# Patient Record
Sex: Female | Born: 1945 | Race: White | Hispanic: No | Marital: Single | Smoking: Former smoker
Health system: Southern US, Community
[De-identification: ages and names within clinical notes are randomized; demographics above are authoritative.]

## PROBLEM LIST (undated history)

## (undated) DIAGNOSIS — M5430 Sciatica, unspecified side: Secondary | ICD-10-CM

## (undated) DIAGNOSIS — K219 Gastro-esophageal reflux disease without esophagitis: Secondary | ICD-10-CM

## (undated) DIAGNOSIS — M1990 Unspecified osteoarthritis, unspecified site: Secondary | ICD-10-CM

## (undated) DIAGNOSIS — I10 Essential (primary) hypertension: Secondary | ICD-10-CM

## (undated) DIAGNOSIS — D649 Anemia, unspecified: Secondary | ICD-10-CM

## (undated) DIAGNOSIS — T8859XA Other complications of anesthesia, initial encounter: Secondary | ICD-10-CM

## (undated) DIAGNOSIS — T884XXA Failed or difficult intubation, initial encounter: Secondary | ICD-10-CM

## (undated) DIAGNOSIS — I82409 Acute embolism and thrombosis of unspecified deep veins of unspecified lower extremity: Secondary | ICD-10-CM

## (undated) DIAGNOSIS — T4145XA Adverse effect of unspecified anesthetic, initial encounter: Secondary | ICD-10-CM

## (undated) HISTORY — PX: ABDOMINAL HYSTERECTOMY: SHX81

---
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
## 2004-08-29 ENCOUNTER — Ambulatory Visit: Payer: Self-pay | Admitting: Internal Medicine

## 2005-05-05 HISTORY — PX: CATARACT EXTRACTION W/ INTRAOCULAR LENS IMPLANT: SHX1309

## 2005-06-18 ENCOUNTER — Ambulatory Visit: Payer: Self-pay | Admitting: Ophthalmology

## 2005-09-12 ENCOUNTER — Ambulatory Visit: Payer: Self-pay | Admitting: Internal Medicine

## 2005-11-26 ENCOUNTER — Ambulatory Visit: Payer: Self-pay | Admitting: Ophthalmology

## 2006-09-16 ENCOUNTER — Ambulatory Visit: Payer: Self-pay | Admitting: Internal Medicine

## 2007-08-06 ENCOUNTER — Ambulatory Visit: Payer: Self-pay | Admitting: Internal Medicine

## 2007-09-20 ENCOUNTER — Ambulatory Visit: Payer: Self-pay | Admitting: Internal Medicine

## 2007-10-05 ENCOUNTER — Ambulatory Visit: Payer: Self-pay | Admitting: Unknown Physician Specialty

## 2008-06-12 ENCOUNTER — Ambulatory Visit: Payer: Self-pay | Admitting: Unknown Physician Specialty

## 2008-08-02 ENCOUNTER — Ambulatory Visit: Payer: Self-pay | Admitting: Unknown Physician Specialty

## 2008-08-07 ENCOUNTER — Ambulatory Visit: Payer: Self-pay | Admitting: Unknown Physician Specialty

## 2008-09-04 ENCOUNTER — Encounter: Payer: Self-pay | Admitting: Unknown Physician Specialty

## 2008-10-03 ENCOUNTER — Encounter: Payer: Self-pay | Admitting: Unknown Physician Specialty

## 2008-10-30 ENCOUNTER — Ambulatory Visit: Payer: Self-pay | Admitting: Internal Medicine

## 2008-11-02 ENCOUNTER — Encounter: Payer: Self-pay | Admitting: Unknown Physician Specialty

## 2008-12-03 ENCOUNTER — Encounter: Payer: Self-pay | Admitting: Unknown Physician Specialty

## 2009-11-20 ENCOUNTER — Ambulatory Visit: Payer: Self-pay | Admitting: Internal Medicine

## 2010-05-05 HISTORY — PX: SHOULDER ARTHROSCOPY WITH OPEN ROTATOR CUFF REPAIR: SHX6092

## 2011-01-23 ENCOUNTER — Ambulatory Visit: Payer: Self-pay | Admitting: Internal Medicine

## 2012-01-27 ENCOUNTER — Ambulatory Visit: Payer: Self-pay

## 2012-03-23 ENCOUNTER — Encounter: Payer: Self-pay | Admitting: Rheumatology

## 2012-04-04 ENCOUNTER — Encounter: Payer: Self-pay | Admitting: Rheumatology

## 2013-02-28 ENCOUNTER — Ambulatory Visit: Payer: Self-pay

## 2013-12-30 ENCOUNTER — Ambulatory Visit: Payer: Self-pay

## 2014-04-14 ENCOUNTER — Ambulatory Visit: Payer: Self-pay

## 2014-06-16 ENCOUNTER — Ambulatory Visit: Payer: Self-pay | Admitting: Unknown Physician Specialty

## 2014-06-28 DIAGNOSIS — K5792 Diverticulitis of intestine, part unspecified, without perforation or abscess without bleeding: Secondary | ICD-10-CM | POA: Insufficient documentation

## 2014-08-28 LAB — SURGICAL PATHOLOGY

## 2015-06-08 ENCOUNTER — Other Ambulatory Visit: Payer: Self-pay | Admitting: Infectious Diseases

## 2015-06-08 DIAGNOSIS — Z1231 Encounter for screening mammogram for malignant neoplasm of breast: Secondary | ICD-10-CM

## 2015-06-13 ENCOUNTER — Ambulatory Visit
Admission: RE | Admit: 2015-06-13 | Discharge: 2015-06-13 | Disposition: A | Discharge status: Home / Self Care | Payer: Medicare Other | Source: Ambulatory Visit | Attending: Infectious Diseases | Admitting: Infectious Diseases

## 2015-06-13 ENCOUNTER — Other Ambulatory Visit: Payer: Self-pay | Admitting: Infectious Diseases

## 2015-06-13 DIAGNOSIS — Z1231 Encounter for screening mammogram for malignant neoplasm of breast: Secondary | ICD-10-CM

## 2015-10-12 ENCOUNTER — Other Ambulatory Visit: Payer: Self-pay | Admitting: Infectious Diseases

## 2015-10-12 DIAGNOSIS — R110 Nausea: Secondary | ICD-10-CM

## 2015-10-12 DIAGNOSIS — R1033 Periumbilical pain: Secondary | ICD-10-CM

## 2015-10-12 DIAGNOSIS — G8929 Other chronic pain: Secondary | ICD-10-CM

## 2015-10-12 DIAGNOSIS — M545 Low back pain: Secondary | ICD-10-CM

## 2015-10-18 ENCOUNTER — Ambulatory Visit
Admission: RE | Admit: 2015-10-18 | Discharge: 2015-10-18 | Disposition: A | Discharge status: Home / Self Care | Payer: Medicare Other | Source: Ambulatory Visit | Attending: Infectious Diseases | Admitting: Infectious Diseases

## 2015-10-18 DIAGNOSIS — K760 Fatty (change of) liver, not elsewhere classified: Secondary | ICD-10-CM | POA: Diagnosis not present

## 2015-10-18 DIAGNOSIS — R938 Abnormal findings on diagnostic imaging of other specified body structures: Secondary | ICD-10-CM | POA: Insufficient documentation

## 2015-10-18 DIAGNOSIS — R1033 Periumbilical pain: Secondary | ICD-10-CM

## 2015-10-18 DIAGNOSIS — M545 Low back pain: Secondary | ICD-10-CM | POA: Insufficient documentation

## 2015-10-18 DIAGNOSIS — R110 Nausea: Secondary | ICD-10-CM

## 2015-10-18 DIAGNOSIS — G8929 Other chronic pain: Secondary | ICD-10-CM | POA: Diagnosis not present

## 2015-10-18 DIAGNOSIS — K8020 Calculus of gallbladder without cholecystitis without obstruction: Secondary | ICD-10-CM | POA: Insufficient documentation

## 2015-10-19 DIAGNOSIS — K8020 Calculus of gallbladder without cholecystitis without obstruction: Secondary | ICD-10-CM | POA: Insufficient documentation

## 2015-10-19 DIAGNOSIS — K8050 Calculus of bile duct without cholangitis or cholecystitis without obstruction: Secondary | ICD-10-CM | POA: Insufficient documentation

## 2015-10-22 ENCOUNTER — Other Ambulatory Visit: Payer: Self-pay | Admitting: Infectious Diseases

## 2015-10-22 DIAGNOSIS — N281 Cyst of kidney, acquired: Secondary | ICD-10-CM

## 2015-11-09 ENCOUNTER — Ambulatory Visit
Admission: RE | Admit: 2015-11-09 | Discharge: 2015-11-09 | Disposition: A | Discharge status: Home / Self Care | Payer: Medicare Other | Source: Ambulatory Visit | Attending: Infectious Diseases | Admitting: Infectious Diseases

## 2015-11-09 DIAGNOSIS — Q6100 Congenital renal cyst, unspecified: Secondary | ICD-10-CM | POA: Insufficient documentation

## 2015-11-09 DIAGNOSIS — N200 Calculus of kidney: Secondary | ICD-10-CM | POA: Diagnosis not present

## 2015-11-09 DIAGNOSIS — K8020 Calculus of gallbladder without cholecystitis without obstruction: Secondary | ICD-10-CM | POA: Insufficient documentation

## 2015-11-09 DIAGNOSIS — I700 Atherosclerosis of aorta: Secondary | ICD-10-CM | POA: Diagnosis not present

## 2015-11-09 DIAGNOSIS — K760 Fatty (change of) liver, not elsewhere classified: Secondary | ICD-10-CM | POA: Diagnosis not present

## 2015-11-09 DIAGNOSIS — N281 Cyst of kidney, acquired: Secondary | ICD-10-CM

## 2015-11-09 DIAGNOSIS — K862 Cyst of pancreas: Secondary | ICD-10-CM | POA: Diagnosis not present

## 2015-11-09 HISTORY — DX: Essential (primary) hypertension: I10

## 2015-11-09 MED ORDER — IOPAMIDOL (ISOVUE-370) INJECTION 76%
100.0000 mL | Freq: Once | INTRAVENOUS | Status: AC | PRN
  Administered 2015-11-09: 100 mL via INTRAVENOUS

## 2015-12-24 DIAGNOSIS — M5431 Sciatica, right side: Secondary | ICD-10-CM | POA: Insufficient documentation

## 2015-12-24 DIAGNOSIS — I10 Essential (primary) hypertension: Secondary | ICD-10-CM | POA: Insufficient documentation

## 2016-01-03 ENCOUNTER — Encounter
Admission: RE | Admit: 2016-01-03 | Discharge: 2016-01-03 | Disposition: A | Discharge status: Home / Self Care | Payer: Medicare Other | Source: Ambulatory Visit | Attending: Surgery | Admitting: Surgery

## 2016-01-03 DIAGNOSIS — I10 Essential (primary) hypertension: Secondary | ICD-10-CM

## 2016-01-03 DIAGNOSIS — Z01812 Encounter for preprocedural laboratory examination: Secondary | ICD-10-CM | POA: Insufficient documentation

## 2016-01-03 DIAGNOSIS — K8010 Calculus of gallbladder with chronic cholecystitis without obstruction: Secondary | ICD-10-CM | POA: Insufficient documentation

## 2016-01-03 DIAGNOSIS — Z01810 Encounter for preprocedural cardiovascular examination: Secondary | ICD-10-CM | POA: Insufficient documentation

## 2016-01-03 HISTORY — DX: Failed or difficult intubation, initial encounter: T88.4XXA

## 2016-01-03 NOTE — Patient Instructions (Addendum)
  Your procedure is scheduled on: Thursday Sept 7, 2017. Report to Same Day Surgery. To find out your arrival time please call [phone] between 1PM - 3PM on Wednesday Sept. 6,2017.  Remember: Instructions that are not followed completely may result in serious medical risk, up to and including death, or upon the discretion of your surgeon and anesthesiologist your surgery may need to be rescheduled.    _x___ 1. Do not eat food or drink liquids after midnight. No gum chewing or hard candies.     _x___ 2. No Alcohol for 24 hours before or after surgery.   ____ 3. Bring all medications with you on the day of surgery if instructed.    __x__ 4. Notify your doctor if there is any change in your medical condition     (cold, fever, infections).    _____ 5. No smoking 24 hours prior to surgery.     Do not wear jewelry, make-up, hairpins, clips or nail polish.  Do not wear lotions, powders, or perfumes.   Do not shave 48 hours prior to surgery. Men may shave face and neck.  Do not bring valuables to the hospital.    [facility] is not responsible for any belongings or valuables.               Contacts, dentures or bridgework may not be worn into surgery.  Leave your suitcase in the car. After surgery it may be brought to your room.  For patients admitted to the hospital, discharge time is determined by your treatment team.   Patients discharged the day of surgery will not be allowed to drive home.    Please read over the following fact sheets that you were given:   [facility] Preparing for Surgery  _x___ Take these medicines the morning of surgery with A SIP OF WATER:    1. metoprolol (LOPRESSOR)  2. omeprazole (PRILOSEC) take an extra dose the night prior to surgery.    ____ Fleet Enema (as directed)   __x__ Use CHG Soap as directed on instruction sheet  ____ Use inhalers on the day of surgery and bring to hospital day of surgery  ____ Stop metformin 2 days prior to  surgery    ____ Take 1/2 of usual insulin dose the night before surgery and none on the morning of surgery.   _x___ Stop aspirin on 01/03/16.  _x___ Stop Anti-inflammatories such as Advil, Aleve, Ibuprofen, Motrin, Naproxen, Naprosyn, Goodies powders or aspirin products. OK to take Tylenol.   _x___ Stop supplements: CITICOLINE SODIUM,Glucosamine-Chondroitin (MOVE FREE), Omega-3 Fatty Acids (FISH OIL), Turmeric Curcumin,  Cyanocobalamin (VITAMIN B 12) and vitamin C until after surgery.    ____ Bring C-Pap to the hospital.

## 2016-01-10 ENCOUNTER — Ambulatory Visit: Payer: Medicare Other | Admitting: Anesthesiology

## 2016-01-10 ENCOUNTER — Encounter
Admission: RE | Disposition: A | Discharge status: Home / Self Care | Payer: Self-pay | Source: Ambulatory Visit | Attending: Surgery

## 2016-01-10 ENCOUNTER — Ambulatory Visit
Admission: RE | Admit: 2016-01-10 | Discharge: 2016-01-10 | Disposition: A | Discharge status: Home / Self Care | Payer: Medicare Other | Source: Ambulatory Visit | Attending: Surgery | Admitting: Surgery

## 2016-01-10 DIAGNOSIS — Z9841 Cataract extraction status, right eye: Secondary | ICD-10-CM | POA: Insufficient documentation

## 2016-01-10 DIAGNOSIS — I10 Essential (primary) hypertension: Secondary | ICD-10-CM | POA: Diagnosis not present

## 2016-01-10 DIAGNOSIS — Z801 Family history of malignant neoplasm of trachea, bronchus and lung: Secondary | ICD-10-CM | POA: Diagnosis not present

## 2016-01-10 DIAGNOSIS — Z7982 Long term (current) use of aspirin: Secondary | ICD-10-CM | POA: Insufficient documentation

## 2016-01-10 DIAGNOSIS — E538 Deficiency of other specified B group vitamins: Secondary | ICD-10-CM | POA: Insufficient documentation

## 2016-01-10 DIAGNOSIS — I471 Supraventricular tachycardia: Secondary | ICD-10-CM | POA: Insufficient documentation

## 2016-01-10 DIAGNOSIS — Z9842 Cataract extraction status, left eye: Secondary | ICD-10-CM | POA: Diagnosis not present

## 2016-01-10 DIAGNOSIS — K8020 Calculus of gallbladder without cholecystitis without obstruction: Secondary | ICD-10-CM

## 2016-01-10 DIAGNOSIS — K8010 Calculus of gallbladder with chronic cholecystitis without obstruction: Secondary | ICD-10-CM | POA: Diagnosis not present

## 2016-01-10 DIAGNOSIS — Z79899 Other long term (current) drug therapy: Secondary | ICD-10-CM | POA: Diagnosis not present

## 2016-01-10 DIAGNOSIS — M1990 Unspecified osteoarthritis, unspecified site: Secondary | ICD-10-CM | POA: Insufficient documentation

## 2016-01-10 DIAGNOSIS — I890 Lymphedema, not elsewhere classified: Secondary | ICD-10-CM | POA: Insufficient documentation

## 2016-01-10 DIAGNOSIS — Z5309 Procedure and treatment not carried out because of other contraindication: Secondary | ICD-10-CM | POA: Insufficient documentation

## 2016-01-10 DIAGNOSIS — E785 Hyperlipidemia, unspecified: Secondary | ICD-10-CM | POA: Diagnosis not present

## 2016-01-10 DIAGNOSIS — Z87891 Personal history of nicotine dependence: Secondary | ICD-10-CM | POA: Insufficient documentation

## 2016-01-10 DIAGNOSIS — Z888 Allergy status to other drugs, medicaments and biological substances status: Secondary | ICD-10-CM | POA: Insufficient documentation

## 2016-01-10 DIAGNOSIS — Z8249 Family history of ischemic heart disease and other diseases of the circulatory system: Secondary | ICD-10-CM | POA: Insufficient documentation

## 2016-01-10 DIAGNOSIS — Z885 Allergy status to narcotic agent status: Secondary | ICD-10-CM | POA: Diagnosis not present

## 2016-01-10 DIAGNOSIS — Z90710 Acquired absence of both cervix and uterus: Secondary | ICD-10-CM | POA: Diagnosis not present

## 2016-01-10 LAB — BASIC METABOLIC PANEL
Anion gap: 8 (ref 5–15)
BUN: 17 mg/dL (ref 6–20)
CHLORIDE: 104 mmol/L (ref 101–111)
CO2: 27 mmol/L (ref 22–32)
CREATININE: 0.69 mg/dL (ref 0.44–1.00)
Calcium: 9.2 mg/dL (ref 8.9–10.3)
GFR calc Af Amer: >60 mL/min (ref >60)
GFR calc non Af Amer: >60 mL/min (ref >60)
GLUCOSE: 105 mg/dL — AB (ref 65–99)
Potassium: 4.2 mmol/L (ref 3.5–5.1)
SODIUM: 139 mmol/L (ref 135–145)

## 2016-01-10 LAB — TROPONIN I: Troponin I: <0.03 ng/mL (ref <0.03)

## 2016-01-10 SURGERY — CANCELLED PROCEDURE
Anesthesia: General | Wound class: Clean Contaminated

## 2016-01-10 MED ORDER — LACTATED RINGERS IV SOLN
INTRAVENOUS | Status: DC
  Administered 2016-01-10 (×2): via INTRAVENOUS

## 2016-01-10 MED ORDER — PHENYLEPHRINE HCL 10 MG/ML IJ SOLN
INTRAMUSCULAR | Status: DC | PRN
  Administered 2016-01-10: 100 ug via INTRAVENOUS

## 2016-01-10 MED ORDER — METOPROLOL TARTRATE 5 MG/5ML IV SOLN
5.0000 mg | Freq: Once | INTRAVENOUS | Status: AC
  Administered 2016-01-10: 5 mg via INTRAVENOUS

## 2016-01-10 MED ORDER — METOPROLOL TARTRATE 50 MG PO TABS: 50.0000 mg | ORAL_TABLET | Freq: Two times a day (BID) | ORAL | Status: DC

## 2016-01-10 MED ORDER — LIDOCAINE HCL (CARDIAC) 20 MG/ML IV SOLN
INTRAVENOUS | Status: DC | PRN
  Administered 2016-01-10: 100 mg via INTRAVENOUS

## 2016-01-10 MED ORDER — MIDAZOLAM HCL 2 MG/2ML IJ SOLN
INTRAMUSCULAR | Status: DC | PRN
  Administered 2016-01-10: 1 mg via INTRAVENOUS

## 2016-01-10 MED ORDER — ONDANSETRON HCL 4 MG/2ML IJ SOLN: 4.0000 mg | Freq: Once | INTRAMUSCULAR | Status: DC | PRN

## 2016-01-10 MED ORDER — ROCURONIUM BROMIDE 100 MG/10ML IV SOLN
INTRAVENOUS | Status: DC | PRN
  Administered 2016-01-10: 40 mg via INTRAVENOUS

## 2016-01-10 MED ORDER — SUGAMMADEX SODIUM 200 MG/2ML IV SOLN
INTRAVENOUS | Status: DC | PRN
  Administered 2016-01-10: 200 mg via INTRAVENOUS

## 2016-01-10 MED ORDER — FENTANYL CITRATE (PF) 100 MCG/2ML IJ SOLN: 25.0000 ug | INTRAMUSCULAR | Status: DC | PRN

## 2016-01-10 MED ORDER — METOPROLOL TARTRATE 5 MG/5ML IV SOLN
INTRAVENOUS | Status: AC
  Administered 2016-01-10: 5 mg via INTRAVENOUS
  Filled 2016-01-10: qty 5

## 2016-01-10 MED ORDER — ESMOLOL HCL 100 MG/10ML IV SOLN
INTRAVENOUS | Status: DC | PRN
  Administered 2016-01-10: 20 mg via INTRAVENOUS
  Administered 2016-01-10: 30 mg via INTRAVENOUS

## 2016-01-10 MED ORDER — ONDANSETRON HCL 4 MG/2ML IJ SOLN
INTRAMUSCULAR | Status: DC | PRN
  Administered 2016-01-10: 4 mg via INTRAVENOUS

## 2016-01-10 MED ORDER — PROPOFOL 10 MG/ML IV BOLUS
INTRAVENOUS | Status: DC | PRN
  Administered 2016-01-10: 140 mg via INTRAVENOUS

## 2016-01-10 MED ORDER — FENTANYL CITRATE (PF) 100 MCG/2ML IJ SOLN
INTRAMUSCULAR | Status: DC | PRN
  Administered 2016-01-10: 100 ug via INTRAVENOUS

## 2016-01-10 SURGICAL SUPPLY — 39 items
APPLIER CLIP ROT 10 11.4 M/L (STAPLE)
APR CLP MED LRG 11.4X10 (STAPLE)
CANISTER SUCT 1200ML W/VALVE (MISCELLANEOUS) ×1 IMPLANT
CANNULA DILATOR 10 W/SLV (CANNULA) ×1 IMPLANT
CANNULA DILATOR 10MM W/SLV (CANNULA)
CATH REDDICK CHOLANGI 4FR 50CM (CATHETERS) ×1 IMPLANT
CHLORAPREP W/TINT 26ML (MISCELLANEOUS) ×1 IMPLANT
CLIP APPLIE ROT 10 11.4 M/L (STAPLE) ×1 IMPLANT
CLOSURE WOUND 1/2 X4 (GAUZE/BANDAGES/DRESSINGS)
DRAPE SHEET LG 3/4 BI-LAMINATE (DRAPES) ×1 IMPLANT
ELECT REM PT RETURN 9FT ADLT (ELECTROSURGICAL)
ELECTRODE REM PT RTRN 9FT ADLT (ELECTROSURGICAL) ×1 IMPLANT
GAUZE SPONGE 4X4 12PLY STRL (GAUZE/BANDAGES/DRESSINGS) ×1 IMPLANT
GLOVE BIO SURGEON STRL SZ7.5 (GLOVE) ×1 IMPLANT
GOWN STRL REUS W/ TWL LRG LVL3 (GOWN DISPOSABLE) ×4 IMPLANT
GOWN STRL REUS W/TWL LRG LVL3 (GOWN DISPOSABLE)
IRRIGATION STRYKERFLOW (MISCELLANEOUS) ×1 IMPLANT
IRRIGATOR STRYKERFLOW (MISCELLANEOUS)
IV NS 1000ML (IV SOLUTION)
IV NS 1000ML BAXH (IV SOLUTION) ×1 IMPLANT
KIT RM TURNOVER STRD PROC AR (KITS) ×1 IMPLANT
LABEL OR SOLS (LABEL) ×1 IMPLANT
NDL FILTER BLUNT 18X1 1/2 (NEEDLE) ×1 IMPLANT
NDL INSUFF ACCESS 14 VERSASTEP (NEEDLE) ×1 IMPLANT
NEEDLE FILTER BLUNT 18X 1/2SAF (NEEDLE)
NEEDLE FILTER BLUNT 18X1 1/2 (NEEDLE) IMPLANT
NS IRRIG 500ML POUR BTL (IV SOLUTION) ×1 IMPLANT
PACK LAP CHOLECYSTECTOMY (MISCELLANEOUS) ×1 IMPLANT
SCISSORS METZENBAUM CVD 33 (INSTRUMENTS) ×1 IMPLANT
SEAL FOR SCOPE WARMER C3101 (MISCELLANEOUS) ×1 IMPLANT
SLEEVE ENDOPATH XCEL 5M (ENDOMECHANICALS) ×1 IMPLANT
STRIP CLOSURE SKIN 1/2X4 (GAUZE/BANDAGES/DRESSINGS) ×1 IMPLANT
SUT CHROMIC 5 0 RB 1 27 (SUTURE) ×1 IMPLANT
SUT VIC AB 0 CT2 27 (SUTURE) IMPLANT
SYR 3ML LL SCALE MARK (SYRINGE) ×1 IMPLANT
TROCAR XCEL NON-BLD 11X100MML (ENDOMECHANICALS) ×1 IMPLANT
TROCAR XCEL NON-BLD 5MMX100MML (ENDOMECHANICALS) ×1 IMPLANT
TUBING INSUFFLATOR HI FLOW (MISCELLANEOUS) ×1 IMPLANT
WATER STERILE IRR 1000ML POUR (IV SOLUTION) ×1 IMPLANT

## 2016-01-10 NOTE — H&P (Signed)
Hannah Yu is an 70 y.o. female.   Chief Complaint: History of epigastric pains HPI: She has a two-year known history of gallbladder disease. She has a history of intermittent pains in the right upper quadrant. The last pain was in June. She had minimal nausea. She had ultrasound findings of a 2.5 cm gallstone. Surgery was recommended for definitive treatment.  She was brought in through the outpatient surgery department and prepared for the operating room. In the operating room she was placed under general anesthesia and the abdomen was prepared for surgery. After induction of anesthesia did develop a supraventricular tachycardia with ST depression. She was given esmolol wits momentarily slowed her heart rate. But had recurrence of tachycardia. On consultation with anesthesia decision was made to counsel the operation  She was carried to the recovery room where she had some palpable additional tachycardia. Dr. Gwen Pounds came for cardiology consultation. She appears to have had supraventricular tachycardia. After awakening she reported no dyspnea and no chest pain.  Past Medical History:  Diagnosis Date  . Difficult intubation   . Hypertension     Past Surgical History:  Procedure Laterality Date  . ABDOMINAL HYSTERECTOMY    . CATARACT EXTRACTION W/ INTRAOCULAR LENS IMPLANT Bilateral 2007   eyes done within 1 month of each other.  Marland Kitchen SHOULDER ARTHROSCOPY WITH OPEN ROTATOR CUFF REPAIR Right 2012    Family History  Problem Relation Age of Onset  . Breast cancer Sister 45   Social History:  reports that she quit smoking about 23 years ago. She has never used smokeless tobacco. She reports that she drinks about 2.4 oz of alcohol per week . She reports that she does not use drugs.  Allergies:  Allergies  Allergen Reactions  . Codeine Diarrhea  . Cortisone Swelling  . Lovastatin Other (See Comments)    Muscle cramps    Medications Prior to Admission  Medication Sig Dispense  Refill  . cyclobenzaprine (FLEXERIL) 5 MG tablet Take 5 mg by mouth 3 (three) times daily as needed for muscle spasms.    . furosemide (LASIX) 40 MG tablet Take 40 mg by mouth 2 (two) times daily as needed for edema.    . metoprolol (LOPRESSOR) 50 MG tablet Take 50 mg by mouth every morning.    . Omega-3 Fatty Acids (FISH OIL) 1200 MG CAPS Take 1 capsule by mouth every morning.    Marland Kitchen omeprazole (PRILOSEC) 20 MG capsule Take 20 mg by mouth every morning.    . Ascorbic Acid (VITAMIN C) 1000 MG tablet Take 1,000 mg by mouth every morning.    Marland Kitchen aspirin 81 MG chewable tablet Chew 81 mg by mouth every morning.    Marland Kitchen CITICOLINE SODIUM PO Take 1 tablet by mouth every morning.    . Cyanocobalamin (VITAMIN B 12 PO) Take 5,000 mcg by mouth every morning.    . Glucosamine-Chondroitin (MOVE FREE PO) Take 1 tablet by mouth every morning.    . naproxen sodium (ANAPROX) 220 MG tablet Take 220 mg by mouth 2 (two) times daily as needed.    . Turmeric Curcumin 500 MG CAPS Take 1 capsule by mouth every morning.      Results for orders placed or performed during the hospital encounter of 01/10/16 (from the past 48 hour(s))  Troponin I     Status: None   Collection Time: 01/10/16  9:19 AM  Result Value Ref Range   Troponin I <0.03 <0.03 ng/mL   No results found.  ROS  she reports no recent acute illness such as cough cold or sore throat. She reports no chest pains and no dyspnea.  Blood pressure (!) 165/74, pulse 73, temperature 97.7 F (36.5 C), resp. rate 15, weight 82.6 kg (182 lb), SpO2 100 %. Physical Exam after a period of observation in the recovery room she was fully awake and alert and oriented. Sclera clear. Sclerae are lung sounds were clear heart regular rhythm S1 and S2. Abdomen soft flat and nontender.  CLINICAL DATA: Troponin less than 0.03. EKG with episodes of sinus tachycardia  Assessment/Plan Chronic cholecystitis cholelithiasis  Supraventricular tachycardia  Anticipation is to  discharge today and have outpatient follow-up in cardiology to consider further evaluation and possible stress testing. Anticipate rescheduling surgery at a later date after cardiac evaluation.  This was discussed with Dr. Henrene HawkingKephart and also with Dr. Gwen PoundsKowalski and also with the patient and friend.  Duration of today's surgical evaluation and critical care monitoring was greater than 2 hours.  Renda RollsWilton Adryan Druckenmiller, MD 01/10/2016, 9:55 AM

## 2016-01-10 NOTE — Anesthesia Procedure Notes (Signed)
Procedure Name: Intubation Date/Time: 01/10/2016 7:45 AM Performed by: Almeta MonasFLETCHER, Makynzi Eastland Pre-anesthesia Checklist: Patient identified, Emergency Drugs available, Suction available and Patient being monitored Patient Re-evaluated:Patient Re-evaluated prior to inductionOxygen Delivery Method: Circle system utilized Preoxygenation: Pre-oxygenation with 100% oxygen Intubation Type: IV induction Ventilation: Mask ventilation without difficulty Laryngoscope Size: Miller and 2 (2) Grade View: Grade I Tube type: Oral Tube size: 6.5 mm Number of attempts: 1 Airway Equipment and Method: Stylet Placement Confirmation: ETT inserted through vocal cords under direct vision,  positive ETCO2,  CO2 detector and breath sounds checked- equal and bilateral Secured at: 21 cm Tube secured with: Tape

## 2016-01-10 NOTE — Progress Notes (Signed)
IV removed at 13:15

## 2016-01-10 NOTE — Anesthesia Postprocedure Evaluation (Signed)
Anesthesia Post Note  Patient: Darrick PennaMargaret T Lippard  Procedure(s) Performed: Procedure(s): CANCELLED PROCEDURE  Patient location during evaluation: PACU Anesthesia Type: General Level of consciousness: awake and alert Pain management: pain level controlled Respiratory status: spontaneous breathing and respiratory function stable Cardiovascular status: blood pressure returned to baseline and tachycardic Anesthetic complications: no Comments: Pt with significant tachycardia immediately following induction. Pt dosed with esmolol and phenylephrine. Better HR control, but still with some bursts of tachycardia. Slight ST changes with the tachycardia. Pt was taken to recovery and eval done by cardiologist who recommended better pre-op control with metoprolol. Pt otherwise stable and D/C'd home.    Last Vitals:  Vitals:   01/10/16 0927 01/10/16 0942  BP: (!) 181/89 (!) 189/70  Pulse: 77 72  Resp: 11 16  Temp:      Last Pain:  Vitals:   01/10/16 0857  TempSrc:   PainSc: Asleep                 KEPHART,WILLIAM K

## 2016-01-10 NOTE — Discharge Instructions (Addendum)
AMBULATORY SURGERY  DISCHARGE INSTRUCTIONS   1) The drugs that you were given will stay in your system until tomorrow so for the next 24 hours you should not:  A) Drive an automobile B) Make any legal decisions C) Drink any alcoholic beverage   2) You may resume regular meals tomorrow.  Today it is better to start with liquids and gradually work up to solid foods.  You may eat anything you prefer, but it is better to start with liquids, then soup and crackers, and gradually work up to solid foods.   3) Please notify your doctor immediately if you have any unusual bleeding, trouble breathing, redness and pain at the surgery site, drainage, fever, or pain not relieved by medication.    4) Additional Instructions:        Please contact your physician with any problems or Same Day Surgery at [phone], Monday through Friday 6 am to 4 pm, or [place] at [facility] Main number at [phone].  Take metoprolol 50 mg 2 times per day.  Follow up in the cardiology office with Dr. Gwen PoundsKowalski for further evaluation.  Contact Dr. Michaelle CopasSmith's office once cardiology evaluation is completed.

## 2016-01-10 NOTE — Op Note (Signed)
OPERATIVE REPORT  PREOPERATIVE  DIAGNOSIS: . Chronic cholecystitis cholelithiasis  POSTOPERATIVE DIAGNOSIS: . Chronic cholecystitis cholelithiasis  PROCEDURE: . Cancelled laparoscopic cholecystectomy  ANESTHESIA:  General  SURGEON: Renda RollsWilton Smith  MD   INDICATIONS: . She has history of epigastric pains and ultrasound findings of gallstones.  She was placed on the operating table in the supine position under general endotracheal anesthesia. The abdomen was prepared with ChloraPrep and draped in a sterile manner  With induction of anesthesia she did have tachycardia with pulse rate up to 130. She also had ST depression. She was given esmolol and momentarily slowed her heart rate but subsequently had additional tachycardia.  The decision was made to cancel the operation due to concerns about cardiac instability.  The patient was prepared for transfer to the recovery room in anticipation of cardiology consultation and evaluation.

## 2016-01-10 NOTE — Anesthesia Preprocedure Evaluation (Signed)
Anesthesia Evaluation  Patient identified by MRN, date of birth, ID band Patient awake    Reviewed: Allergy & Precautions, NPO status , Patient's Chart, lab work & pertinent test results  History of Anesthesia Complications (+) DIFFICULT AIRWAY  Airway Mallampati: IV       Dental   Pulmonary neg pulmonary ROS, former smoker,           Cardiovascular hypertension, Pt. on medications and Pt. on home beta blockers      Neuro/Psych negative neurological ROS     GI/Hepatic negative GI ROS, Neg liver ROS,   Endo/Other  negative endocrine ROS  Renal/GU negative Renal ROS     Musculoskeletal   Abdominal   Peds  Hematology negative hematology ROS (+)   Anesthesia Other Findings   Reproductive/Obstetrics                             Anesthesia Physical Anesthesia Plan  ASA: II  Anesthesia Plan: General   Post-op Pain Management:    Induction: Intravenous  Airway Management Planned: Oral ETT  Additional Equipment:   Intra-op Plan:   Post-operative Plan:   Informed Consent: I have reviewed the patients History and Physical, chart, labs and discussed the procedure including the risks, benefits and alternatives for the proposed anesthesia with the patient or authorized representative who has indicated his/her understanding and acceptance.     Plan Discussed with:   Anesthesia Plan Comments:         Anesthesia Quick Evaluation

## 2016-01-10 NOTE — Transfer of Care (Signed)
Immediate Anesthesia Transfer of Care Note  Patient: Hannah Yu  Procedure(s) Performed: Procedure(s) with comments: CANCELLED PROCEDURE - cardiac issues once asleep. cancelled by anesthesia   Patient Location: PACU  Anesthesia Type:General  Level of Consciousness: sedated  Airway & Oxygen Therapy: Patient connected to face mask oxygen  Post-op Assessment: Post -op Vital signs reviewed and unstable, Anesthesiologist notified  Post vital signs: Reviewed  Last Vitals:  Vitals:   01/10/16 0600 01/10/16 0827  BP: (!) 182/80 (!) 160/98  Pulse: 72 79  Resp: 16 17  Temp: (!) 36 C 36.5 C    Last Pain:  Vitals:   01/10/16 0600  TempSrc: Tympanic  PainSc: 7          Complications: cardiovascular complications Rhythm changes with ST depression after induction, case cancelled

## 2016-01-10 NOTE — H&P (Signed)
  She reports no change in condition since office exam.  Labs noted    Discussed plan for lap cholecystectomy and post op care

## 2016-01-14 DIAGNOSIS — Z8679 Personal history of other diseases of the circulatory system: Secondary | ICD-10-CM | POA: Insufficient documentation

## 2016-03-07 ENCOUNTER — Other Ambulatory Visit: Payer: Self-pay | Admitting: Physical Medicine and Rehabilitation

## 2016-03-07 DIAGNOSIS — M5416 Radiculopathy, lumbar region: Secondary | ICD-10-CM

## 2016-03-19 ENCOUNTER — Ambulatory Visit: Payer: Medicare Other

## 2016-03-21 ENCOUNTER — Ambulatory Visit
Admission: RE | Admit: 2016-03-21 | Discharge: 2016-03-21 | Disposition: A | Discharge status: Home / Self Care | Payer: Medicare Other | Source: Ambulatory Visit | Attending: Physical Medicine and Rehabilitation | Admitting: Physical Medicine and Rehabilitation

## 2016-03-21 DIAGNOSIS — E882 Lipomatosis, not elsewhere classified: Secondary | ICD-10-CM | POA: Diagnosis not present

## 2016-03-21 DIAGNOSIS — M4726 Other spondylosis with radiculopathy, lumbar region: Secondary | ICD-10-CM | POA: Insufficient documentation

## 2016-03-21 DIAGNOSIS — M5416 Radiculopathy, lumbar region: Secondary | ICD-10-CM | POA: Diagnosis present

## 2016-07-02 ENCOUNTER — Other Ambulatory Visit: Payer: Self-pay | Admitting: Infectious Diseases

## 2016-07-02 DIAGNOSIS — Z1231 Encounter for screening mammogram for malignant neoplasm of breast: Secondary | ICD-10-CM

## 2016-07-21 ENCOUNTER — Ambulatory Visit
Admission: RE | Admit: 2016-07-21 | Discharge: 2016-07-21 | Disposition: A | Discharge status: Home / Self Care | Payer: Medicare Other | Source: Ambulatory Visit | Attending: Infectious Diseases | Admitting: Infectious Diseases

## 2016-07-21 DIAGNOSIS — Z1231 Encounter for screening mammogram for malignant neoplasm of breast: Secondary | ICD-10-CM | POA: Insufficient documentation

## 2016-09-12 DIAGNOSIS — E669 Obesity, unspecified: Secondary | ICD-10-CM | POA: Insufficient documentation

## 2016-11-11 DIAGNOSIS — M1611 Unilateral primary osteoarthritis, right hip: Secondary | ICD-10-CM | POA: Insufficient documentation

## 2016-11-11 DIAGNOSIS — M4807 Spinal stenosis, lumbosacral region: Secondary | ICD-10-CM | POA: Insufficient documentation

## 2017-05-27 ENCOUNTER — Ambulatory Visit: Payer: Medicare Other | Attending: Family Medicine

## 2017-05-27 DIAGNOSIS — M25551 Pain in right hip: Secondary | ICD-10-CM

## 2017-05-27 DIAGNOSIS — M5441 Lumbago with sciatica, right side: Secondary | ICD-10-CM | POA: Insufficient documentation

## 2017-05-27 DIAGNOSIS — G8929 Other chronic pain: Secondary | ICD-10-CM | POA: Diagnosis present

## 2017-05-27 NOTE — Patient Instructions (Signed)
Patient encouraged to try out graded aerobic exercises (walking with repeated forward flexion to reduce pain prn, biking, swimming) and informed patient this would be a goal in therapy as well. PT prescribed one exercise to begin graded exercise approach

## 2017-05-27 NOTE — Therapy (Addendum)
[facility] [address] [place], [address] Phone: [phone]   Fax:  [phone]  Physical Therapy Evaluation  Patient Details  Name: Hannah Yu MRN: [id number] Date of Birth: Apr 04, 1946 No Data Recorded  Encounter Date: 05/27/2017  PT End of Session - 05/27/17 1510    Visit Number  1    Number of Visits  17    Date for PT Re-Evaluation  07/22/17    Authorization Type  Medicare    PT Start Time  0145    PT Stop Time  0240    PT Time Calculation (min)  55 min    Activity Tolerance  Patient tolerated treatment well    Behavior During Therapy  [facility] for tasks assessed/performed       Past Medical History:  Diagnosis Date  . Difficult intubation   . Hypertension     Past Surgical History:  Procedure Laterality Date  . ABDOMINAL HYSTERECTOMY    . CATARACT EXTRACTION W/ INTRAOCULAR LENS IMPLANT Bilateral 2007   eyes done within 1 month of each other.  Marland Kitchen SHOULDER ARTHROSCOPY WITH OPEN ROTATOR CUFF REPAIR Right 2012    There were no vitals filed for this visit.   Subjective Assessment - 05/27/17 1357    Subjective  Patient reports LBP that has radiated down the R thigh over the past 16 months causing her to have to ambulate with a cane.    Pertinent History  Patient is a pleasant 71y/o with referral for lumbar stenosis and rediculopathy. Patient reports a negative experience with physical therapy that she attributes to being the cause of her detrimental back pain.  Patient reports that she continued to tell PT  that she was in pain and felt as though PT progressed her too quickly. Patient has negative perception of movement and  correlates exercise to pain, and therefore will benefit from continuous education on pain science and a graded exercise approach.     Limitations  Walking;Standing;Sitting    How long can you sit comfortably?     How long can you stand comfortably?     How  long can you walk comfortably?  Cannot walk without pain    Currently in Pain?  Yes    Pain Score  0-No pain    Pain Location  Hip    Pain Orientation  Right;Lower    Pain Descriptors / Indicators  Sharp;Radiating    Pain Type  Chronic pain    Pain Radiating Towards  Down to R knee from LB    Pain Onset  More than a month ago    Pain Frequency  Intermittent    Aggravating Factors   Walking, prolonged standing    Pain Relieving Factors  Heat and ice         [facility] PT Assessment - 05/27/17 0001      Assessment   Medical Diagnosis  R sciatica and stenosis     Hand Dominance  Right    Next MD Visit  06/19/17    Prior Therapy  June 2017      Precautions   Precautions  None      Balance Screen   Has the patient fallen in the past 6 months  No    Has the patient had a decrease in activity level because of a fear of falling?   No    Is the patient reluctant to leave their home because of a  fear of falling?   No      Home Environment   Living Environment  -- 7 steps w/ 2 handrails uses step-to gait pattern      Prior Function   Level of Independence  Independent    Vocation  Retired    Gaffer  Prolonged sitting    Leisure  -- Walk on [employer], play with grandchildren      Sensation   Light Touch  -- intact    Hot/Cold  -- intach      Sit to Stand   Comments  -- 5x STS 27sec      AROM   Right Hip Extension  -- limited by pain    Right Hip Flexion  -- limited by pain    Right Hip External Rotation   -- limited by pain    Right Hip Internal Rotation   -- limited by pain    Right Hip ABduction  -- limited by pain    Left Hip Extension  -- wnl    Left Hip Flexion  -- wnl    Left Hip External Rotation   -- wnl    Left Hip Internal Rotation   -- wnl    Lumbar Flexion  -- wnl pain relieves    Lumbar Extension  -- limited by pain    Lumbar - Right Side Bend  -- wnl    Lumbar - Left Side Bend  -- wnl    Thoracic Flexion  -- wnl    Thoracic Extension  -- wnl     Thoracic - Right Side Bend  -- wnl    Thoracic - Left Side Bend  -- wnl      PROM   Right Hip External Rotation   -- wnl    Right Hip Internal Rotation   -- limited by pain    Left Hip Extension  -- wnl    Left Hip Flexion  -- wnl    Left Hip External Rotation   -- wnl    Left Hip Internal Rotation   -- wnl      Strength   Right Hip Flexion  3-/5 limited by pain    Right Hip Extension  3-/5    Right Hip External Rotation   2+/5    Right Hip Internal Rotation  3/5 Painful    Right Hip ABduction  3/5 Painful    Right Hip ADduction  4/5    Left Hip Flexion  4/5    Left Hip Extension  4/5    Left Hip External Rotation  4/5    Left Hip Internal Rotation  4/5    Left Hip ABduction  4/5    Left Hip ADduction  4/5      Right Hip   Right Hip Extension  -- wnl    Right Hip Flexion  -- limited by pain      Slump test   Findings  Negative    Side  Right      Straight Leg Raise   Findings  Positive 90/90 Hamstring flexibility test    Side   Right    Comment  -- bilat             Objective measurements completed on examination: See above findings.    There-Ex HEP review: bridges 2x10 sets 2x/day Extensive education on pain science given and graded exercise approach and global effect of aerobic exercise on pain and on life participation.  PT Education - 05/27/17 1508    Education provided  Yes    Education Details  Educated on POC- graded exercise approach, anatomy and physiology involved, diagnosis, prognosis, pain science, and role of PT    Person(s) Educated  Patient    Methods  Explanation;Demonstration;Tactile cues;Verbal cues;Handout    Comprehension  Verbalized understanding;Returned demonstration;Verbal cues required       PT Short Term Goals - 05/27/17 1526      PT SHORT TERM GOAL #1   Title  Patient will be 100% proficient with HEP in order to maintain PT progress    Time  4    Period  Weeks    Status  New      PT SHORT TERM GOAL #2    Title  Patient will complete a regular aerobic exercise program at least 23min/week in order to increase muscular endurance needed for prolonged ambulation    Time  4    Period  Weeks    Status  New        PT Long Term Goals - 05/27/17 1527      PT LONG TERM GOAL #1   Title  Patient will report hip pain decrease by 3 points on NPRS to be able to participate in ADL's     Time  8    Period  Weeks    Status  New      PT LONG TERM GOAL #2   Title  Patient will demonstrate bilateral, symmetrical gross 4/5 hip strength in order to negotiate stairs safely    Time  8    Period  Weeks    Status  New             Plan - 05/27/17 1511    Clinical Impression Statement  Pt is a 72 y/o female presenting with LBP that radiates into R hip, . Patient displays increased tone in R lumbar paraspinals, and in bilat hamstrings (R>L). Patient is unable to actively move her R hip in all directions d/t pain, making strength testing difficult. Patient walks with inconsistent antalgic gait requiring a cane d/t pain in the R hip. Patient reports pain is prohibiting her from being able to sit or stand comfortably for prolonged periods of time, and that she is unable to ambulate any distance without pain. Patient will benefit from skilled PT to address these deficits in ROM , strength, gait, and pain to return patient to plof so she can participate fully. Patient required extensive education on pain science and graded exercise approach, which she verbalized understanding of and seemed to respond well to optimistically participate in PT.     History and Personal Factors relevant to plan of care:  (+) Motivation, lack of prior medical hx (-) Negative prior experience with PT, chronicity of pain, sedentary lifestyle    Clinical Presentation  Evolving    Clinical Presentation due to:  Objective tests and measures    Clinical Decision Making  Moderate    Rehab Potential  Good    PT Frequency  2x / week    PT  Duration  8 weeks    PT Treatment/Interventions  Aquatic Therapy;Cryotherapy;Electrical Stimulation;Moist Heat;Traction;Ultrasound;Gait training;Stair training;Functional mobility training;Neuromuscular re-education;Balance training;Therapeutic exercise;Therapeutic activities;Patient/family education;Manual techniques;Dry needling;Taping    PT Next Visit Plan  Discuss aerobic exercise program; HEP review; continue graded exercise approach     PT Home Exercise Plan  Bridges, aerobic program    Consulted and Agree with Plan of Care  Patient  Patient will benefit from skilled therapeutic intervention in order to improve the following deficits and impairments:  Abnormal gait, Decreased endurance, Decreased mobility, Difficulty walking, Hypomobility, Impaired perceived functional ability, Decreased activity tolerance, Decreased strength, Increased fascial restricitons  Visit Diagnosis: Chronic right-sided low back pain with right-sided sciatica  Pain in right hip     Problem List There are no active problems to display for this patient.  [name], PT, DPT [name] 05/27/2017, 3:38 PM  [facility] [address] [place], [place], [address] Phone: [phone]   Fax:  [phone]  Name: Hannah Yu MRN: [id number] Date of Birth: 08/17/1945

## 2017-05-28 ENCOUNTER — Telehealth: Payer: Self-pay | Admitting: Physical Therapy

## 2017-05-28 NOTE — Telephone Encounter (Signed)
Contacted patient to confirm next appt, patient is aware, no changes at this time  [name] PT, DPT

## 2017-06-01 ENCOUNTER — Ambulatory Visit: Payer: Medicare Other | Admitting: Physical Therapy

## 2017-06-01 ENCOUNTER — Encounter: Payer: Self-pay | Admitting: Physical Therapy

## 2017-06-01 DIAGNOSIS — G8929 Other chronic pain: Secondary | ICD-10-CM

## 2017-06-01 DIAGNOSIS — M5441 Lumbago with sciatica, right side: Principal | ICD-10-CM

## 2017-06-01 NOTE — Therapy (Signed)
[facility] [address], [place], [address] Phone: [phone]   Fax:  [phone]  Physical Therapy Treatment  Patient Details  Name: Hannah Yu MRN: [id number] Date of Birth: 1946/03/13 No Data Recorded  Encounter Date: 06/01/2017  PT End of Session - 06/01/17 1207    Visit Number  2    Number of Visits  17    Date for PT Re-Evaluation  07/22/17    Authorization Type  Medicare    PT Start Time  1115    PT Stop Time  1200    PT Time Calculation (min)  45 min    Activity Tolerance  Patient tolerated treatment well    Behavior During Therapy  [facility] for tasks assessed/performed       Past Medical History:  Diagnosis Date  . Difficult intubation   . Hypertension     Past Surgical History:  Procedure Laterality Date  . ABDOMINAL HYSTERECTOMY    . CATARACT EXTRACTION W/ INTRAOCULAR LENS IMPLANT Bilateral 2007   eyes done within 1 month of each other.  Marland Kitchen SHOULDER ARTHROSCOPY WITH OPEN ROTATOR CUFF REPAIR Right 2012    There were no vitals filed for this visit.  Subjective Assessment - 06/01/17 1121    Subjective  Patient reports LBP was the same over the weekend, but that she was able to find relief with "repeated bending over". Patient reports sciatica pain down the back of the R thigh, as well as "a pulling sensation" in the anterior thigh.     Pertinent History  Patient is a pleasant 72y/o with referral for lumbar stenosis and rediculopathy. Patient reports a negative experience with physical therapy that she attributes to being the cause of her detrimental back pain.  Patient reports that she continued to tell PT  that she was in pain and felt as though PT progressed her too quickly. Patient has negative perception of movement and  correlates exercise to pain, and therefore will benefit from continuous education on pain science and a graded exercise approach.     Limitations   Walking;Standing;Sitting    How long can you sit comfortably?     How long can you stand comfortably?     How long can you walk comfortably?  Cannot walk without pain    Currently in Pain?  Yes    Pain Score  0-No pain    Pain Onset  More than a month ago           Manual -Hamstring stretch x3 45sec hold -Hamstring contract-relax 10sec contraction 20sec hold x4 -Manual supine piriformis stretch 4x 45sec hold -Manual thomas stretch for R hip flexors 4x 45sec hold -Manual R obers stretch for 4x 45sec hold  Ther-Ex  -Nustep 5 mins during hx intake and education on beginning an aquatic aerobic program  -Clamshell 3x 10 with TC for pain free ROM -Standing hip flexor stretch 3x 45sec (added to HEP) - bridges 3x 10reps HEP review with min VC required for proper form -Seated and supine piriformis stretch holding supine 3x 45sec per pt preference (added to HEP)                    PT Education - 06/01/17 1145    Education provided  Yes    Education Details  HEP progression    Person(s) Educated  Patient    Methods  Explanation;Demonstration;Handout    Comprehension  Returned demonstration;Verbalized understanding  PT Short Term Goals - 05/27/17 1526      PT SHORT TERM GOAL #1   Title  Patient will be 100% proficient with HEP in order to maintain PT progress    Time  4    Period  Weeks    Status  New      PT SHORT TERM GOAL #2   Title  Patient will complete a regular aerobic exercise program at least 730min/week in order to increase muscular endurance needed for prolonged ambulation    Time  4    Period  Weeks    Status  New        PT Long Term Goals - 05/27/17 1527      PT LONG TERM GOAL #1   Title  Patient will report hip pain decrease by 3 points on NPRS to be able to participate in ADL's     Time  8    Period  Weeks    Status  New      PT LONG TERM GOAL #2   Title  Patient will demonstrate bilateral, symmetrical gross 4/5 hip  strength in order to negotiate stairs safely    Time  8    Period  Weeks    Status  New            Plan - 06/01/17 1207    Clinical Impression Statement  Pt reponded well to manual stretching today reporting only discomfort with noted pain relief. Following tretment patient ambulated with more even step length and increased cadence, and reporting walking did not feel as painful. Patient reported some hip flexor pain with supine march exercise, but was able to decrease quad activation taking longer rest break between sets. PT added hip flexor and piriformis stretch to pts HEP which she agreed to comply with. PT will continue graded exercise approach.    Clinical Presentation  Evolving    Clinical Decision Making  Moderate    Rehab Potential  Good    PT Frequency  2x / week    PT Duration  8 weeks    PT Treatment/Interventions  Aquatic Therapy;Cryotherapy;Electrical Stimulation;Moist Heat;Traction;Ultrasound;Gait training;Stair training;Functional mobility training;Neuromuscular re-education;Balance training;Therapeutic exercise;Therapeutic activities;Patient/family education;Manual techniques;Dry needling;Taping    PT Next Visit Plan  Discuss aerobic exercise program; HEP review; continue graded exercise approach     PT Home Exercise Plan  Bridges, aerobic program    Consulted and Agree with Plan of Care  Patient       Patient will benefit from skilled therapeutic intervention in order to improve the following deficits and impairments:  Abnormal gait, Decreased endurance, Decreased mobility, Difficulty walking, Hypomobility, Impaired perceived functional ability, Decreased activity tolerance, Decreased strength, Increased fascial restricitons  Visit Diagnosis: Chronic right-sided low back pain with right-sided sciatica     Problem List There are no active problems to display for this patient.  [name] PT, DPT [name] 06/01/2017, 12:41 PM  [facility] [address] [place], [place], [address] Phone: [phone]   Fax:  [phone]  Name: Evalina FieldMargaret T Barbier MRN: [id number] Date of Birth: 08/03/1945

## 2017-06-04 ENCOUNTER — Encounter: Payer: Self-pay | Admitting: Physical Therapy

## 2017-06-04 ENCOUNTER — Ambulatory Visit: Payer: Medicare Other | Admitting: Physical Therapy

## 2017-06-04 DIAGNOSIS — M25551 Pain in right hip: Secondary | ICD-10-CM

## 2017-06-04 DIAGNOSIS — M5441 Lumbago with sciatica, right side: Secondary | ICD-10-CM | POA: Diagnosis not present

## 2017-06-04 DIAGNOSIS — G8929 Other chronic pain: Secondary | ICD-10-CM

## 2017-06-04 NOTE — Therapy (Signed)
[facility] [address] [place], [place], [address] Phone: [phone]   Fax:  [phone]  Physical Therapy Treatment  Patient Details  Name: Hannah Yu MRN: [id number] Date of Birth: 07/02/1945 No Data Recorded  Encounter Date: 06/04/2017  PT End of Session - 06/04/17 1213    Visit Number  3    Number of Visits  17    Date for PT Re-Evaluation  07/22/17    Authorization Type  Medicare    PT Start Time  1115    PT Stop Time  1200    PT Time Calculation (min)  45 min    Activity Tolerance  Patient tolerated treatment well    Behavior During Therapy  [facility]/per[facility]       Past Medical History:  Diagnosis Date  . Difficult intubation   . Hypertension     Past Surgical History:  Procedure Laterality Date  . ABDOMINAL HYSTERECTOMY    . CATARACT EXTRACTION W/ INTRAOCULAR LENS IMPLANT Bilateral 2007   eyes done within 1 month of each other.  Marland Kitchen. SHOULDER ARTHROSCOPY WITH OPEN ROTATOR CUFF REPAIR Right 2012    There were no vitals filed for this visit.  Subjective Assessment - 06/04/17 1125    Subjective  Patient reports after Monday's session she was in a lot of pain, and that her "stomach was sore" but she was "okay with that because she knew that meant she was working the right muscles. She reports being in a lot of pain Tuesday, but it subsided Wednesday and she was able to do her HEP with minimal pain. She reports standing flexion offered some pain relief during this time.     Pertinent History  Patient is a pleasant 71y/o with referral for lumbar stenosis and rediculopathy. Patient reports a negative experience with physical therapy that she attributes to being the cause of her detrimental back pain.  Patient reports that she continued to tell PT  that she was in pain and felt as though PT progressed her too quickly. Patient has negative perception of movement and  correlates exercise to  pain, and therefore will benefit from continuous education on pain science and a graded exercise approach.     Limitations  Walking;Standing;Sitting    How long can you sit comfortably?  30min    How long can you stand comfortably?  10mins    How long can you walk comfortably?  Cannot walk without pain    Currently in Pain?  Yes    Pain Score  5     Pain Orientation  Right;Lower    Pain Descriptors / Indicators  Radiating    Pain Type  Chronic pain    Pain Onset  More than a month ago       Ther-Ex -Nustep 5 mins during hx intake - 3x 10 bridge w/ yellow band at knees for abd VC needed for proper glute contraction to prevent paraspinal compensation -3x 6 STS with VC and TC for glute contraction to prevent paraspinal compensation *Patient reports a notable difference when using her glute muscles with less back pain*  ESTIM 15 IFC at patient tolerated 14V intensity with heat pack. PT monitored patient pain response throughout session and continued to educate patient on graded exercise. Skin integrity assessed following treatment; normal. Patient reported decreased pain following.   Manual -Hamstring stretch 45sec x3 (blat) -Hamstring contract 10 sec contract 20 sec stretch hold x4 -Ely's 3x  45sec holds -FABER stretch 3x 45sec                      PT Education - 06/04/17 1128    Education provided  Yes    Education Details  Pain modulation and muscle soreness timeline    Person(s) Educated  Patient    Methods  Explanation    Comprehension  Verbalized understanding       PT Short Term Goals - 05/27/17 1526      PT SHORT TERM GOAL #1   Title  Patient will be 100% proficient with HEP in order to maintain PT progress    Time  4    Period  Weeks    Status  New      PT SHORT TERM GOAL #2   Title  Patient will complete a regular aerobic exercise program at least 63min/week in order to increase muscular endurance needed for prolonged ambulation    Time  4     Period  Weeks    Status  New        PT Long Term Goals - 05/27/17 1527      PT LONG TERM GOAL #1   Title  Patient will report hip pain decrease by 3 points on NPRS to be able to participate in ADL's     Time  8    Period  Weeks    Status  New      PT LONG TERM GOAL #2   Title  Patient will demonstrate bilateral, symmetrical gross 4/5 hip strength in order to negotiate stairs safely    Time  8    Period  Weeks    Status  New            Plan - 06/04/17 1214    Clinical Impression Statement  Pt continues to respond well to manual therapy to decrease pain. Pt responded well to ESTIM + heat and will monitor if is continues to give her pain relief. Patient has notably more lengthened LE tissue during manual therapy. Pt reports feeling encouraged by this round of PT, and agrees to stick to current POC    Clinical Presentation  Evolving    Clinical Decision Making  Moderate    PT Frequency  2x / week    PT Duration  8 weeks    PT Treatment/Interventions  Aquatic Therapy;Cryotherapy;Electrical Stimulation;Moist Heat;Traction;Ultrasound;Gait training;Stair training;Functional mobility training;Neuromuscular re-education;Balance training;Therapeutic exercise;Therapeutic activities;Patient/family education;Manual techniques;Dry needling;Taping    PT Next Visit Plan  Discuss aerobic exercise program; HEP review; continue graded exercise approach     PT Home Exercise Plan  Bridges, aerobic program    Consulted and Agree with Plan of Care  Patient       Patient will benefit from skilled therapeutic intervention in order to improve the following deficits and impairments:  Abnormal gait, Decreased endurance, Decreased mobility, Difficulty walking, Hypomobility, Impaired perceived functional ability, Decreased activity tolerance, Decreased strength, Increased fascial restricitons  Visit Diagnosis: Chronic right-sided low back pain with right-sided sciatica  Pain in right  hip     Problem List There are no active problems to display for this patient.   Staci Acosta 06/04/2017, 12:42 PM  [place] [facility] PH[facility] SPORTS MEDICINE [address] [place], [address] Phone: [phone]   Fax:  [phone]  Name: Hannah Yu MRN: [id number] Date of Birth: [birth year]

## 2017-06-09 ENCOUNTER — Ambulatory Visit: Payer: Medicare Other | Attending: Family Medicine | Admitting: Physical Therapy

## 2017-06-09 ENCOUNTER — Encounter: Payer: Self-pay | Admitting: Physical Therapy

## 2017-06-09 DIAGNOSIS — G8929 Other chronic pain: Secondary | ICD-10-CM | POA: Insufficient documentation

## 2017-06-09 DIAGNOSIS — M25551 Pain in right hip: Secondary | ICD-10-CM | POA: Insufficient documentation

## 2017-06-09 DIAGNOSIS — M5441 Lumbago with sciatica, right side: Secondary | ICD-10-CM | POA: Insufficient documentation

## 2017-06-09 NOTE — Therapy (Signed)
[facility] [address] [place], [place], [address] Phone: [phone]   Fax:  [phone]  Physical Therapy Treatment  Patient Details  Name: Hannah Yu MRN: [id number] Date of Birth: 11/11/1945 No Data Recorded  Encounter Date: 06/09/2017  PT End of Session - 06/09/17 1515    Visit Number  4    Number of Visits  17    Date for PT Re-Evaluation  07/22/17    PT Start Time  0230    PT Stop Time  0315    PT Time Calculation (min)  45 min    Activity Tolerance  Patient tolerated treatment well    Behavior During Therapy  [facility] for tasks assessed/performed       Past Medical History:  Diagnosis Date  . Difficult intubation   . Hypertension     Past Surgical History:  Procedure Laterality Date  . ABDOMINAL HYSTERECTOMY    . CATARACT EXTRACTION W/ INTRAOCULAR LENS IMPLANT Bilateral 2007   eyes done within 1 month of each other.  Marland Kitchen. SHOULDER ARTHROSCOPY WITH OPEN ROTATOR CUFF REPAIR Right 2012    There were no vitals filed for this visit.  Subjective Assessment - 06/09/17 1430    Subjective  Pt reported decreased pain following ESTIM treatment until the night time. She reports HEP is "going fine" but has stopped the supine piriformis stretch because it was causing her severe back pain.     Pertinent History  Patient is a pleasant 71y/o with referral for lumbar stenosis and rediculopathy. Patient reports a negative experience with physical therapy that she attributes to being the cause of her detrimental back pain.  Patient reports that she continued to tell PT  that she was in pain and felt as though PT progressed her too quickly. Patient has negative perception of movement and  correlates exercise to pain, and therefore will benefit from continuous education on pain science and a graded exercise approach.     Limitations  Walking;Standing;Sitting    How long can you sit comfortably?  30min    How long can you  stand comfortably?  10mins    How long can you walk comfortably?  Cannot walk without pain    Pain Onset  More than a month ago          Manual -Posterior hip mobs grade III 30 sec bouts 6 bouts with pain relief -Long axis hip distraction Grade 3 30sec bouts 8 bouts -Manual lumbar distraction Grade 3 30sec bouts 8 bouts -Hamstring contract relax 10sec contract 20sec hold x4 -Supine add stretch (stretching into abd) 3x 30sec holds    Ther-Ex -Stair hamstring stretch 3x 30sec  -Seated piriformis stretch (modified with L LE straight and R ankle propped on L shin) 3x 30sec -Mini Squats 3x10 at treadmill bar with bilat 2 finger support w/ VC for glute activation and full hip ext -3x 10 R clamshell VC for proper pelvic positioning                   PT Education - 06/09/17 1502    Education Details  HEP progression- added clamshell    Methods  Explanation;Demonstration;Handout    Comprehension  Returned demonstration;Verbalized understanding;Verbal cues required       PT Short Term Goals - 05/27/17 1526      PT SHORT TERM GOAL #1   Title  Patient will be 100% proficient with HEP in order to maintain PT progress  Time  4    Period  Weeks    Status  New      PT SHORT TERM GOAL #2   Title  Patient will complete a regular aerobic exercise program at least 18min/week in order to increase muscular endurance needed for prolonged ambulation    Time  4    Period  Weeks    Status  New        PT Long Term Goals - 05/27/17 1527      PT LONG TERM GOAL #1   Title  Patient will report hip pain decrease by 3 points on NPRS to be able to participate in ADL's     Time  8    Period  Weeks    Status  New      PT LONG TERM GOAL #2   Title  Patient will demonstrate bilateral, symmetrical gross 4/5 hip strength in order to negotiate stairs safely    Time  8    Period  Weeks    Status  New            Plan - 06/09/17 1516    Clinical Impression Statement  PT  taught patient  self-stretching techniques today to help supplement moving treatment into a more patient- active role. Pt tolerated treatment progression well, with no complaints of pain, only "soreness". Patient continues to respond well to mobilization techniques, will continue as indicated.     Rehab Potential  Good    PT Frequency  2x / week    PT Duration  8 weeks    PT Treatment/Interventions  Aquatic Therapy;Cryotherapy;Electrical Stimulation;Moist Heat;Traction;Ultrasound;Gait training;Stair training;Functional mobility training;Neuromuscular re-education;Balance training;Therapeutic exercise;Therapeutic activities;Patient/family education;Manual techniques;Dry needling;Taping    PT Next Visit Plan  Continue grading exercise approach for hip strengthening, assess HEP additions and pain response, manual techniques    PT Home Exercise Plan  Bridges, clamshell, standing hip flexor stretch and seated pirformis stretch, aerobic program    Consulted and Agree with Plan of Care  Patient       Patient will benefit from skilled therapeutic intervention in order to improve the following deficits and impairments:  Abnormal gait, Decreased endurance, Decreased mobility, Difficulty walking, Hypomobility, Impaired perceived functional ability, Decreased activity tolerance, Decreased strength, Increased fascial restricitons  Visit Diagnosis: Chronic right-sided low back pain with right-sided sciatica     Problem List There are no active problems to display for this patient.  Staci Acosta PT, DPT Staci Acosta 06/09/2017, 3:56 PM  [place] [facility] PHYSICAL AND SPORTS MEDICINE [address] [place], [address] Phone: [phone]   Fax:  [phone]  Name: Hannah Yu MRN: [id number] Date of Birth: March 12, 1946

## 2017-06-12 ENCOUNTER — Ambulatory Visit: Payer: Medicare Other | Admitting: Physical Therapy

## 2017-06-16 ENCOUNTER — Ambulatory Visit: Payer: Medicare Other | Admitting: Physical Therapy

## 2017-06-16 ENCOUNTER — Encounter: Payer: Self-pay | Admitting: Physical Therapy

## 2017-06-16 DIAGNOSIS — M5441 Lumbago with sciatica, right side: Principal | ICD-10-CM

## 2017-06-16 DIAGNOSIS — G8929 Other chronic pain: Secondary | ICD-10-CM

## 2017-06-16 NOTE — Therapy (Signed)
[place] [facility] MEDICINE [address] [place], [place], [address] Phone: [phone]   Fax:  [phone]  Physical Therapy Treatment  Patient Details  Name: Hannah Yu MRN: [id number] Date of Birth: 12/30/1945 No Data Recorded  Encounter Date: 06/16/2017  PT End of Session - 06/16/17 1516    Visit Number  5    Number of Visits  17    Date for PT Re-Evaluation  07/22/17    PT Start Time  0230    PT Stop Time  0320    PT Time Calculation (min)  50 min    Activity Tolerance  Patient tolerated treatment well    Behavior During Therapy  [facility]       Past Medical History:  Diagnosis Date  . Difficult intubation   . Hypertension     Past Surgical History:  Procedure Laterality Date  . ABDOMINAL HYSTERECTOMY    . CATARACT EXTRACTION W/ INTRAOCULAR LENS IMPLANT Bilateral 2007   eyes done within 1 month of each other.  Marland Kitchen. SHOULDER ARTHROSCOPY WITH OPEN ROTATOR CUFF REPAIR Right 2012    There were no vitals filed for this visit.  Subjective Assessment - 06/16/17 1430    Subjective  Pt reports "everything hurts today" becuase of the weather. Following last treatment patient reports good pain relief. Patient says her stretches are giving her more pain relief.  Patient reports that although she is in a lot of pain today, overall she thinks her pain is decreasing. Since she was seen alst she reports intermittent pain only up to 5/10.    Pertinent History  Patient is a pleasant 71y/o with referral for lumbar stenosis and rediculopathy. Patient reports a negative experience with physical therapy that she attributes to being the cause of her detrimental back pain.  Patient reports that she continued to tell PT  that she was in pain and felt as though PT progressed her too quickly. Patient has negative perception of movement and  correlates exercise to pain, and therefore will benefit from continuous education  on pain science and a graded exercise approach.     Limitations  Walking;Standing;Sitting    How long can you sit comfortably?  30min    How long can you stand comfortably?  10mins    How long can you walk comfortably?  Cannot walk without pain    Currently in Pain?  Yes    Pain Score  5     Pain Location  Hip    Pain Orientation  Right;Lower    Pain Onset  More than a month ago       Ther-Ex -Bike 4 mins during hx intake (pt was able to go all the way around without use of hands to R LE) -Stair hamstring stretch R LE 3x 30sec (traded for seated hamstring stretch for HEP) -Stair hip flexor stretch R LE 3x 30sec with cuing for proper form -Mini squats 3x 10 from elevated mat table -Lateral walks 8915ft down and back x3 w/ VC to maintain trunk control  Manual -STM and trigger point release to R prox hamstring insertion and R piriformis w/ noted tension relief -Bilat Ely's stretch 3x 30sec each  - Hip lateral mobilization w/ belt and IR movement 30sec bouts 8 bouts -Long axis hip distraction 10sec hold w/ 10sec release x8   Heat pack 8 min at the end of session (unbilled)  PT Education - 06/16/17 1515    Education provided  Yes    Education Details  Exercise form    Person(s) Educated  Patient    Methods  Explanation;Tactile cues;Verbal cues    Comprehension  Verbalized understanding       PT Short Term Goals - 05/27/17 1526      PT SHORT TERM GOAL #1   Title  Patient will be 100% proficient with HEP in order to maintain PT progress    Time  4    Period  Weeks    Status  New      PT SHORT TERM GOAL #2   Title  Patient will complete a regular aerobic exercise program at least 50min/week in order to increase muscular endurance needed for prolonged ambulation    Time  4    Period  Weeks    Status  New        PT Long Term Goals - 05/27/17 1527      PT LONG TERM GOAL #1   Title  Patient will report hip pain decrease by 3 points on  NPRS to be able to participate in ADL's     Time  8    Period  Weeks    Status  New      PT LONG TERM GOAL #2   Title  Patient will demonstrate bilateral, symmetrical gross 4/5 hip strength in order to negotiate stairs safely    Time  8    Period  Weeks    Status  New            Plan - 06/16/17 1517    Clinical Impression Statement  Pt is reported good pain relief from stretching andless pain overall. Patient's sciatica symptoms are beginning to centralize and has not reached her toes in the past week except for "on occassion". Patient continues to tolerate therex well, with noted fatigue with all exercises. PT will continue to progress therex as able.     PT Frequency  2x / week    PT Duration  8 weeks    PT Treatment/Interventions  Aquatic Therapy;Cryotherapy;Electrical Stimulation;Moist Heat;Traction;Ultrasound;Gait training;Stair training;Functional mobility training;Neuromuscular re-education;Balance training;Therapeutic exercise;Therapeutic activities;Patient/family education;Manual techniques;Dry needling;Taping    PT Next Visit Plan  Continue grading exercise approach for hip strengthening, assess HEP additions and pain response, manual techniques    PT Home Exercise Plan  Bridges, clamshell, standing hip flexor stretch and seated pirformis stretch, aerobic program       Patient will benefit from skilled therapeutic intervention in order to improve the following deficits and impairments:  Abnormal gait, Decreased endurance, Decreased mobility, Difficulty walking, Hypomobility, Impaired perceived functional ability, Decreased activity tolerance, Decreased strength, Increased fascial restricitons  Visit Diagnosis: Chronic right-sided low back pain with right-sided sciatica     Problem List There are no active problems to display for this patient.  Staci Acosta PT, DPT  Staci Acosta 06/16/2017, 3:21 PM  [facility] PHYSICAL AND  SPORTS MEDICINE [address] [place], [address] Phone: [phone]   Fax:  [phone]  Name: Hannah Yu MRN: [id number] Date of Birth: 03/23/1946

## 2017-06-17 ENCOUNTER — Ambulatory Visit: Payer: Medicare Other | Admitting: Physical Therapy

## 2017-06-18 ENCOUNTER — Ambulatory Visit: Payer: Medicare Other | Admitting: Physical Therapy

## 2017-06-18 ENCOUNTER — Encounter: Payer: Medicare Other | Admitting: Physical Therapy

## 2017-06-18 ENCOUNTER — Encounter: Payer: Self-pay | Admitting: Physical Therapy

## 2017-06-18 DIAGNOSIS — M25551 Pain in right hip: Secondary | ICD-10-CM

## 2017-06-18 DIAGNOSIS — M5441 Lumbago with sciatica, right side: Principal | ICD-10-CM

## 2017-06-18 DIAGNOSIS — G8929 Other chronic pain: Secondary | ICD-10-CM

## 2017-06-18 NOTE — Therapy (Signed)
[facility] [facility] [address] [place], [place], [address] Phone: [phone]   Fax:  [phone]  Physical Therapy Treatment  Patient Details  Name: Hannah Yu MRN: [id number] Date of Birth: 06/26/1945 No Data Recorded  Encounter Date: 06/18/2017  PT End of Session - 06/18/17 1614    Visit Number  6    Number of Visits  17    Date for PT Re-Evaluation  07/22/17    PT Start Time  0345    PT Stop Time  0435    PT Time Calculation (min)  50 min    Activity Tolerance  Patient tolerated treatment well    Behavior During Therapy  [facility] assessed/performed       Past Medical History:  Diagnosis Date  . Difficult intubation   . Hypertension     Past Surgical History:  Procedure Laterality Date  . ABDOMINAL HYSTERECTOMY    . CATARACT EXTRACTION W/ INTRAOCULAR LENS IMPLANT Bilateral 2007   eyes done within 1 month of each other.  Marland Kitchen. SHOULDER ARTHROSCOPY WITH OPEN ROTATOR CUFF REPAIR Right 2012    There were no vitals filed for this visit.  Subjective Assessment - 06/18/17 1547    Subjective  Pt reports only 4/10 pain today and her pain has been the best following last session that it has been so far. Patient reports she has not had radiating pain into the LE or foot in the past week; which she is pleased about.     Pertinent History  Patient is a pleasant 72y/o with referral for lumbar stenosis and rediculopathy. Patient reports a negative experience with physical therapy that she attributes to being the cause of her detrimental back pain.  Patient reports that she continued to tell PT  that she was in pain and felt as though PT progressed her too quickly. Patient has negative perception of movement and  correlates exercise to pain, and therefore will benefit from continuous education on pain science and a graded exercise approach.     Limitations  Walking;Standing;Sitting    How long can you sit comfortably?   30min    How long can you stand comfortably?  10mins    How long can you walk comfortably?  Cannot walk without pain    Pain Onset  More than a month ago           Ther-Ex -Bridge w/ yellow tband at knees 3x 10 w/ cuing for eccentric control -Clamshell w/ yellow band at knees 3x 10 w/ VC for proper pelvic positioning -STS from elevated mat table 1x 10; 2nd set was painful to patient and PT elevated table more but patient reported she was just too fatigued and her pain was "deep in the bottom" PT instead did piriformis stretching and provided ice to patient following session.   Manual -STM and trigger point release  piriformis and glute med with noted tissue release and decreased pain reported after -Lateral hip mobilization w/ belt w/ IR movement 30sec bouts 6 bouts -long axis hip distraction 10sec hold w/ 10 sec release x 4 -Hamstring contract-relax 10sec contract 20 sec relax -FABER stretch w/ PT overpressure 3x 30 sec -Manual piriformis stretch (R knee and L shoulder) 3x 30sec  Ice pack 10 min unbilled                   PT Education - 06/18/17 1614    Education provided  Yes  Education Details  Exercise form    Person(s) Educated  Patient    Methods  Explanation;Verbal cues;Tactile cues    Comprehension  Verbalized understanding;Returned demonstration;Need further instruction       PT Short Term Goals - 05/27/17 1526      PT SHORT TERM GOAL #1   Title  Patient will be 100% proficient with HEP in order to maintain PT progress    Time  4    Period  Weeks    Status  New      PT SHORT TERM GOAL #2   Title  Patient will complete a regular aerobic exercise program at least 49min/week in order to increase muscular endurance needed for prolonged ambulation    Time  4    Period  Weeks    Status  New        PT Long Term Goals - 05/27/17 1527      PT LONG TERM GOAL #1   Title  Patient will report hip pain decrease by 3 points on NPRS to be able to  participate in ADL's     Time  8    Period  Weeks    Status  New      PT LONG TERM GOAL #2   Title  Patient will demonstrate bilateral, symmetrical gross 4/5 hip strength in order to negotiate stairs safely    Time  8    Period  Weeks    Status  New            Plan - 06/18/17 1632    Clinical Impression Statement  Pt continues to report good pain relief from manual therapy. Patient did have tension with palpable knots at the R piriformis and glute med which subsided and reduced pain following STM w/ trigger point release and stretching. Patient tolerated therex progression with theraband resistance well with no complaints of pain, which she was happy about. During STS exercise patient reported some discomfort that subsided after stopping exercise. PT ceased therex following paino continue with 10% graded exercise approach to not discourage patient, and following stretching and ice patient reported no pain. PT will continue this POC.      Rehab Potential  Good    PT Frequency  2x / week    PT Duration  8 weeks    PT Treatment/Interventions  Aquatic Therapy;Cryotherapy;Electrical Stimulation;Moist Heat;Traction;Ultrasound;Gait training;Stair training;Functional mobility training;Neuromuscular re-education;Balance training;Therapeutic exercise;Therapeutic activities;Patient/family education;Manual techniques;Dry needling;Taping    PT Next Visit Plan  Continue grading exercise approach for hip strengthening, assess HEP additions and pain response, manual techniques    PT Home Exercise Plan  Bridges, clamshell, standing hip flexor stretch and seated pirformis stretch, aerobic program    Consulted and Agree with Plan of Care  Patient       Patient will benefit from skilled therapeutic intervention in order to improve the following deficits and impairments:  Abnormal gait, Decreased endurance, Decreased mobility, Difficulty walking, Hypomobility, Impaired perceived functional ability,  Decreased activity tolerance, Decreased strength, Increased fascial restricitons  Visit Diagnosis: Chronic right-sided low back pain with right-sided sciatica  Pain in right hip     Problem List There are no active problems to display for this patient.  Staci Acosta PT, DPT  Staci Acosta 06/18/2017, 4:39 PM  [facility] PHYSICAL AND SPORTS MEDICINE [address], [place], [address] Phone: [phone]   Fax:  [phone]  Name: Hannah Yu MRN: [id number] Date of Birth: May 31, 1945

## 2017-06-22 ENCOUNTER — Ambulatory Visit: Payer: Medicare Other | Admitting: Physical Therapy

## 2017-06-22 ENCOUNTER — Encounter: Payer: Self-pay | Admitting: Physical Therapy

## 2017-06-22 DIAGNOSIS — G8929 Other chronic pain: Secondary | ICD-10-CM

## 2017-06-22 DIAGNOSIS — M5441 Lumbago with sciatica, right side: Principal | ICD-10-CM

## 2017-06-22 NOTE — Therapy (Signed)
[place] [facility]ORTS MEDICINE [address], [place], [address] Phone: [phone]   Fax:  [phone]  Physical Therapy Treatment  Patient Details  Name: AILIS RIGAUD MRN: [id number] Date of Birth: March 17, 1946 No Data Recorded  Encounter Date: 06/22/2017    Past Medical History:  Diagnosis Date  . Difficult intubation   . Hypertension     Past Surgical History:  Procedure Laterality Date  . ABDOMINAL HYSTERECTOMY    . CATARACT EXTRACTION W/ INTRAOCULAR LENS IMPLANT Bilateral 2007   eyes done within 1 month of each other.  Marland Kitchen SHOULDER ARTHROSCOPY WITH OPEN ROTATOR CUFF REPAIR Right 2012    There were no vitals filed for this visit.  Subjective Assessment - 06/22/17 1434    Subjective  Pt reports pain is 5/10 today. She reports she has not had radiating sx, but that her hip feels "bruised" where she has been using a tennis ball for STM. PT advised patient to hold off on STM w/ ball until pain subsides and to use ice for soreness.     Pertinent History  Patient is a pleasant 71y/o with referral for lumbar stenosis and rediculopathy. Patient reports a negative experience with physical therapy that she attributes to being the cause of her detrimental back pain.  Patient reports that she continued to tell PT  that she was in pain and felt as though PT progressed her too quickly. Patient has negative perception of movement and  correlates exercise to pain, and therefore will benefit from continuous education on pain science and a graded exercise approach.     Limitations  Walking;Standing;Sitting    How long can you sit comfortably?     How long can you stand comfortably?     How long can you walk comfortably?  Cannot walk without pain    Pain Onset  More than a month ago               Ther-Ex -Bike 4 mins during hx intake (pt was able to go all the way around without use of hands to R LE) -Reverse  clamshell 3x 10 w/ intital TC needed for proper form -SAQ (patient unable to complete SLR) 3x 12 w/ 2# with cuing for eccentric control -Mini squats 3x 10 from elevated mat table    Manual -STM and trigger point release to R prox hamstring insertion and R piriformis w/ noted tension relief each location - Hamstring stretch 4x 30sec  - Manual piriformis stretch 4x 30sec  -Long axis hip distraction 10sec hold w/ 10sec release x8   Ice Pack 8 min at end of session (not billed                   PT Short Term Goals - 05/27/17 1526      PT SHORT TERM GOAL #1   Title  Patient will be 100% proficient with HEP in order to maintain PT progress    Time  4    Period  Weeks    Status  New      PT SHORT TERM GOAL #2   Title  Patient will complete a regular aerobic exercise program at least 39min/week in order to increase muscular endurance needed for prolonged ambulation    Time  4    Period  Weeks    Status  New        PT Long Term Goals - 05/27/17 1527  PT LONG TERM GOAL #1   Title  Patient will report hip pain decrease by 3 points on NPRS to be able to participate in ADL's     Time  8    Period  Weeks    Status  New      PT LONG TERM GOAL #2   Title  Patient will demonstrate bilateral, symmetrical gross 4/5 hip strength in order to negotiate stairs safely    Time  8    Period  Weeks    Status  New              Patient will benefit from skilled therapeutic intervention in order to improve the following deficits and impairments:     Visit Diagnosis: No diagnosis found.     Problem List There are no active problems to display for this patient.   Staci AcostaChelsea Miller 06/22/2017, 2:36 PM  [facility] [address] [place], [place], [address] Phone: [phone]   Fax:  [phone]  Name: Evalina FieldMargaret T Chovanec MRN: [id number] Date of Birth: 10/09/1945

## 2017-06-23 DIAGNOSIS — M792 Neuralgia and neuritis, unspecified: Secondary | ICD-10-CM | POA: Insufficient documentation

## 2017-06-25 ENCOUNTER — Encounter: Payer: Self-pay | Admitting: Physical Therapy

## 2017-06-25 ENCOUNTER — Ambulatory Visit: Payer: Medicare Other | Admitting: Physical Therapy

## 2017-06-25 DIAGNOSIS — M25551 Pain in right hip: Secondary | ICD-10-CM

## 2017-06-25 DIAGNOSIS — G8929 Other chronic pain: Secondary | ICD-10-CM

## 2017-06-25 DIAGNOSIS — M5441 Lumbago with sciatica, right side: Principal | ICD-10-CM

## 2017-06-25 NOTE — Therapy (Signed)
[place] [facility] PHYS[facility] SPORTS MEDICINE [address], [place], [address] Phone: [phone]   Fax:  [phone]  Physical Therapy Treatment  Patient Details  Name: Hannah Yu MRN: [id number] Date of Birth: 1945-11-04 No Data Recorded  Encounter Date: 06/25/2017  PT End of Session - 06/25/17 1116    Visit Number  8    Number of Visits  17    Date for PT Re-Evaluation  07/22/17    Authorization Type  Medicare    PT Start Time  1045    PT Stop Time  1137    PT Time Calculation (min)  52 min    Activity Tolerance  Patient tolerated treatment well    Behavior During Therapy  [facility] for tasks assessed/performed       Past Medical History:  Diagnosis Date  . Difficult intubation   . Hypertension     Past Surgical History:  Procedure Laterality Date  . ABDOMINAL HYSTERECTOMY    . CATARACT EXTRACTION W/ INTRAOCULAR LENS IMPLANT Bilateral 2007   eyes done within 1 month of each other.  Marland Kitchen SHOULDER ARTHROSCOPY WITH OPEN ROTATOR CUFF REPAIR Right 2012    There were no vitals filed for this visit.  Subjective Assessment - 06/25/17 1048    Subjective  Pt reports yesterdya her pain was very bad on her entire R side, which she attributes to the weather yesterday. Patient reports she is in 5/10 pain today. Patient reports she was able to complete her HEP yesterday morning before the pain came on.     Pertinent History  Patient is a pleasant 72y/o with referral for lumbar stenosis and rediculopathy. Patient reports a negative experience with physical therapy that she attributes to being the cause of her detrimental back pain.  Patient reports that she continued to tell PT  that she was in pain and felt as though PT progressed her too quickly. Patient has negative perception of movement and  correlates exercise to pain, and therefore will benefit from continuous education on pain science and a graded exercise approach.     Limitations   Walking;Standing;Sitting    How long can you sit comfortably?     How long can you stand comfortably?     How long can you walk comfortably?  Cannot walk without pain    Currently in Pain?  Yes    Pain Score  5     Pain Onset  More than a month ago          Ther-Ex -Bike 4 mins during hx intake (pt was able to go all the way around without use of hands to R LE) unbilled -Prone hip ext 3x 10 each LE w/ cuing for glute contraction w/o lumbar compensation -SAQ (patient unable to complete SLR) 3x 12 w/ 2# with cuing for eccentric control -Mini squats 3x 10 from elevated mat table  -Wall sits 20sec holds x4 (added to HEP)   Manual -STM and trigger point release to R prox hamstring insertion and R piriformis, discontinued b/c there were no palpable tissue restrictions and patient reported she felt no tightness or pain in the area today.  - 90/90 hamstring stretch bilat 4x 45sec  - Manual piriformis stretch 4x 45sec  -Long axis hip distraction 10sec hold w/ 10sec release x8 -Manual lumbar traction  10sec hold w/ 10sec release x8  Ice Pack at the end of treatment 9 mins (unbilled)  PT Education - 06/25/17 1114    Education provided  Yes    Education Details  Exercise From    Starwood HotelsPerson(s) Educated  Patient    Methods  Explanation;Demonstration;Verbal cues    Comprehension  Returned demonstration;Verbalized understanding;Verbal cues required       PT Short Term Goals - 05/27/17 1526      PT SHORT TERM GOAL #1   Title  Patient will be 100% proficient with HEP in order to maintain PT progress    Time  4    Period  Weeks    Status  New      PT SHORT TERM GOAL #2   Title  Patient will complete a regular aerobic exercise program at least 3630min/week in order to increase muscular endurance needed for prolonged ambulation    Time  4    Period  Weeks    Status  New        PT Long Term Goals - 05/27/17 1527      PT LONG TERM GOAL #1    Title  Patient will report hip pain decrease by 3 points on NPRS to be able to participate in ADL's     Time  8    Period  Weeks    Status  New      PT LONG TERM GOAL #2   Title  Patient will demonstrate bilateral, symmetrical gross 4/5 hip strength in order to negotiate stairs safely    Time  8    Period  Weeks    Status  New            Plan - 06/25/17 1124    Clinical Impression Statement  Pt had no palpable trigger points in soft tissue today, which she attributes to using a tennis ball for STM massage to the glute/piriformis/prox hamstring. Patient tolerated therex progression well with no noted pain, only muscular fatigue, requiring prolonged rest breaks between sets. Patient is requiring less manual techniques and is progressing into more active treatment plan, which PT is pleased with. Patient continues to demonstrate good squat form and PT is continuing to lower the elevation of the mat table and patient is continuing to maintain correct form w/o pain. Patient subjectively is making good progress with reduced pain as well, and is much more "bought in" to PT than she was at the evaluation    Rehab Potential  Good    PT Frequency  2x / week    PT Duration  8 weeks    PT Treatment/Interventions  Aquatic Therapy;Cryotherapy;Electrical Stimulation;Moist Heat;Traction;Ultrasound;Gait training;Stair training;Functional mobility training;Neuromuscular re-education;Balance training;Therapeutic exercise;Therapeutic activities;Patient/family education;Manual techniques;Dry needling;Taping    PT Next Visit Plan  Continue grading exercise approach for hip strengthening (hip flexor focus as well),  manual techniques    PT Home Exercise Plan  Bridges, clamshell, standing hip flexor stretch and seated pirformis stretch, aerobic program; added 2/21 wall sits 20sec holds       Patient will benefit from skilled therapeutic intervention in order to improve the following deficits and impairments:   Abnormal gait, Decreased endurance, Decreased mobility, Difficulty walking, Hypomobility, Impaired perceived functional ability, Decreased activity tolerance, Decreased strength, Increased fascial restricitons  Visit Diagnosis: Chronic right-sided low back pain with right-sided sciatica  Pain in right hip     Problem List There are no active problems to display for this patient.  [name] PT, DPT [name] 06/25/2017, 11:38 AM  [place] [facility] [address] [place], [address] Phone: [phone]   Fax:  [phone]  Name: TIWANA CHAVIS MRN: [id number] Date of Birth: 04-05-1946

## 2017-06-29 ENCOUNTER — Encounter: Payer: Self-pay | Admitting: Physical Therapy

## 2017-06-29 ENCOUNTER — Ambulatory Visit: Payer: Medicare Other | Admitting: Physical Therapy

## 2017-06-29 DIAGNOSIS — M5441 Lumbago with sciatica, right side: Principal | ICD-10-CM

## 2017-06-29 DIAGNOSIS — G8929 Other chronic pain: Secondary | ICD-10-CM

## 2017-06-29 NOTE — Therapy (Signed)
[place] [facility] [address], [place], [address] Phone: [phone]   Fax:  [phone]  Physical Therapy Treatment  Patient Details  Name: Hannah Yu MRN: [id number] Date of Birth: Dec 06, 1945 No Data Recorded  Encounter Date: 06/29/2017  PT End of Session - 06/29/17 1456    Visit Number  9    Number of Visits  17    Date for PT Re-Evaluation  07/22/17    Authorization Type  Medicare    PT Start Time  0219    PT Stop Time  0306    PT Time Calculation (min)  47 min       Past Medical History:  Diagnosis Date  . Difficult intubation   . Hypertension     Past Surgical History:  Procedure Laterality Date  . ABDOMINAL HYSTERECTOMY    . CATARACT EXTRACTION W/ INTRAOCULAR LENS IMPLANT Bilateral 2007   eyes done within 1 month of each other.  Marland Kitchen SHOULDER ARTHROSCOPY WITH OPEN ROTATOR CUFF REPAIR Right 2012    There were no vitals filed for this visit.  Subjective Assessment - 06/29/17 1423    Subjective  Pt reports her pain was better until saturday and her pain in the R buttock has been 7/10. Patient also reports lateral R knee pain 5/10 following increasing band for clamshell exercise to red, PT advised her to rest from exercise until pain is gone and restart exercise with yellow band following (and RICE protocol).     Pertinent History  Patient is a pleasant 72y/o with referral for lumbar stenosis and rediculopathy. Patient reports a negative experience with physical therapy that she attributes to being the cause of her detrimental back pain.  Patient reports that she continued to tell PT  that she was in pain and felt as though PT progressed her too quickly. Patient has negative perception of movement and  correlates exercise to pain, and therefore will benefit from continuous education on pain science and a graded exercise approach.     Limitations  Walking;Standing;Sitting    How long can you sit  comfortably?     How long can you stand comfortably?     How long can you walk comfortably?  Cannot walk without pain    Currently in Pain?  Yes    Pain Score  6     Pain Onset  More than a month ago             Ther-Ex -Bike 4 mins during hx intake (pt was able to go all the way around without use of hands to R LE) unbilled -Wall sits 30sec holds x1 HEP review patient demonstrated good form with mild L LE preference which she was able to correct   Manual -STM and trigger point release to R prox hamstring insertion and R piriformis, with noted tension relief following; focused on palpable tautness in R piriformis -R manual Ely's stretch 3x 45 sec holds - Manual piriformis stretch 4x 45sec  -Ober's stretch for glute med w/ cross friction massage to distal TFL -Long axis hip distraction 10sec hold w/ 10sec release x8 -Manual lumbar traction  10sec hold w/ 10sec release x8  ESTIM: 15 min attended ESTIM (+heat pack) at patient-tolerated 18Volt increasing to 19V after 8 minutes w/ PT continuously monitoring intensity/pain level, examining skin following, and educating patient on glute med exercise and result on TFL  PT Education - 06/29/17 1455    Education provided  Yes    Education Details  Exercise form; exercise intensity    Person(s) Educated  Patient    Methods  Explanation;Demonstration;Tactile cues;Verbal cues    Comprehension  Verbal cues required;Returned demonstration;Verbalized understanding       PT Short Term Goals - 05/27/17 1526      PT SHORT TERM GOAL #1   Title  Patient will be 100% proficient with HEP in order to maintain PT progress    Time  4    Period  Weeks    Status  New      PT SHORT TERM GOAL #2   Title  Patient will complete a regular aerobic exercise program at least 2330min/week in order to increase muscular endurance needed for prolonged ambulation    Time  4    Period  Weeks    Status  New         PT Long Term Goals - 05/27/17 1527      PT LONG TERM GOAL #1   Title  Patient will report hip pain decrease by 3 points on NPRS to be able to participate in ADL's     Time  8    Period  Weeks    Status  New      PT LONG TERM GOAL #2   Title  Patient will demonstrate bilateral, symmetrical gross 4/5 hip strength in order to negotiate stairs safely    Time  8    Period  Weeks    Status  New            Plan - 06/29/17 1459    Clinical Impression Statement  Pt had increased pain today with palpable tautness with trigger point along R piriformis, which subsided after prolonged STM and trigger point release techniques. Patient was advised to cease clamshell exercise until subjective pain localized to distal TFL insertion subsides and to begin exercise with yellow tband, as increasing to red  preceeded increased lateral knee pain and deep hip ER soreness. Patient requested ESTIM + heat for pain as this has decreased pain in the past, and today reported significant pain relief from modality. Patient demonstrated HEP progression (wall sit) well with no pain and only mild form correction. Patient had considered coming to PT 1x/week prior to this weekend as her pain was responding very well. At this time patient will keep 2x/week appt and will call if she feels as though her pain management is going well with HEP changes.     Rehab Potential  Good    PT Frequency  2x / week    PT Duration  8 weeks    PT Treatment/Interventions  Aquatic Therapy;Cryotherapy;Electrical Stimulation;Moist Heat;Traction;Ultrasound;Gait training;Stair training;Functional mobility training;Neuromuscular re-education;Balance training;Therapeutic exercise;Therapeutic activities;Patient/family education;Manual techniques;Dry needling;Taping    PT Next Visit Plan  Continue grading exercise approach for hip strengthening (hip flexor focus as well),  manual techniques    PT Home Exercise Plan  Bridges, clamshell, standing  hip flexor stretch and seated pirformis stretch, aerobic program; added 2/21 wall sits 20sec holds    Consulted and Agree with Plan of Care  Patient       Patient will benefit from skilled therapeutic intervention in order to improve the following deficits and impairments:  Abnormal gait, Decreased endurance, Decreased mobility, Difficulty walking, Hypomobility, Impaired perceived functional ability, Decreased activity tolerance, Decreased strength, Increased fascial restricitons  Visit Diagnosis: Chronic right-sided low back pain with right-sided sciatica  Problem List There are no active problems to display for this patient.  Staci Acosta PT, DPT Staci Acosta 06/29/2017, 3:03 PM  [place] [facility] PHYSICAL AND SPORTS MEDICINE [address], [place], [address] Phone: [phone]   Fax:  [phone]  Name: Hannah Yu MRN: [id number] Date of Birth: Jul 07, 1945

## 2017-07-01 ENCOUNTER — Ambulatory Visit: Payer: Medicare Other | Admitting: Physical Therapy

## 2017-07-06 ENCOUNTER — Encounter: Payer: Self-pay | Admitting: Physical Therapy

## 2017-07-06 ENCOUNTER — Ambulatory Visit: Payer: Medicare Other | Attending: Family Medicine | Admitting: Physical Therapy

## 2017-07-06 DIAGNOSIS — M5441 Lumbago with sciatica, right side: Secondary | ICD-10-CM | POA: Insufficient documentation

## 2017-07-06 DIAGNOSIS — G8929 Other chronic pain: Secondary | ICD-10-CM | POA: Insufficient documentation

## 2017-07-06 DIAGNOSIS — M25551 Pain in right hip: Secondary | ICD-10-CM | POA: Insufficient documentation

## 2017-07-06 NOTE — Therapy (Addendum)
[place] [facility]TS MEDICINE [address] [place], [address] Phone: [phone]   Fax:  [phone]  Physical Therapy Treatment  Patient Details  Name: Hannah Yu MRN: [id number] Date of Birth: Sep 26, 1945 No Data Recorded  Encounter Date: 07/06/2017    Past Medical History:  Diagnosis Date  . Difficult intubation   . Hypertension     Past Surgical History:  Procedure Laterality Date  . ABDOMINAL HYSTERECTOMY    . CATARACT EXTRACTION W/ INTRAOCULAR LENS IMPLANT Bilateral 2007   eyes done within 1 month of each other.  Marland Kitchen SHOULDER ARTHROSCOPY WITH OPEN ROTATOR CUFF REPAIR Right 2012    There were no vitals filed for this visit.   Ther-Ex  -Bike 4 mins during hx intake (pt was able to go all the way around without use of hands to R LE) unbilled -Wall sits 30sec holds x1 HEP review patient demonstrated good form with mild L LE preference which she was able to correct -Standing hip ext pushing downyellow tband 3x 10 -Leg press R LE only 5# patient unable to tolerate so discontinued -SAQ w/ 2# 3x 10 with cuing for eccentric control  Manual -STM and trigger point release to R prox hamstring insertion (discontinued d/t no pain or tightness in this area and R piriformis, with noted tension relief following -R manual Ely's stretch 3x 45 sec holds - Manual piriformis stretch 4x 45sec  -Ober's stretch for glute med w/ cross friction massage to distal TFL 3x 30sec w/ only mild tenderness noted today -Long axis hip distraction 10sec hold w/ 10sec release x8 -Manual lumbar traction  10sec hold w/ 10sec release x8   ESTIM: 10 min attended ESTIM (+heat pack) at patient-tolerated 19V w/ PT continuously monitoring intensity/pain level, examining skin following, and educating patient on glute med exercise and result on TFL                           PT Short Term Goals - 05/27/17 1526      PT SHORT  TERM GOAL #1   Title  Patient will be 100% proficient with HEP in order to maintain PT progress    Time  4    Period  Weeks    Status  New      PT SHORT TERM GOAL #2   Title  Patient will complete a regular aerobic exercise program at least 70min/week in order to increase muscular endurance needed for prolonged ambulation    Time  4    Period  Weeks    Status  New        PT Long Term Goals - 05/27/17 1527      PT LONG TERM GOAL #1   Title  Patient will report hip pain decrease by 3 points on NPRS to be able to participate in ADL's     Time  8    Period  Weeks    Status  New      PT LONG TERM GOAL #2   Title  Patient will demonstrate bilateral, symmetrical gross 4/5 hip strength in order to negotiate stairs safely    Time  8    Period  Weeks    Status  New              Patient will benefit from skilled therapeutic intervention in order to improve the following deficits and impairments:  Abnormal gait, Decreased endurance, Decreased mobility,  Difficulty walking, Hypomobility, Impaired perceived functional ability, Decreased activity tolerance, Decreased strength, Increased fascial restricitons  Visit Diagnosis: Chronic right-sided low back pain with right-sided sciatica     Problem List There are no active problems to display for this patient.  [name] PT, DPT [name] 07/15/2017, 2:05 PM  [facility] [address] [place], [place], [address] Phone: [phone]   Fax:  [phone]  Name: Hannah Yu MRN: [id number] Date of Birth: 12/28/1945

## 2017-07-09 ENCOUNTER — Encounter: Payer: Medicare Other | Admitting: Physical Therapy

## 2017-07-09 ENCOUNTER — Ambulatory Visit: Payer: Medicare Other | Admitting: Physical Therapy

## 2017-07-13 ENCOUNTER — Encounter: Payer: Medicare Other | Admitting: Physical Therapy

## 2017-07-16 ENCOUNTER — Ambulatory Visit: Payer: Medicare Other

## 2017-07-16 DIAGNOSIS — M5441 Lumbago with sciatica, right side: Secondary | ICD-10-CM | POA: Diagnosis not present

## 2017-07-16 DIAGNOSIS — G8929 Other chronic pain: Secondary | ICD-10-CM

## 2017-07-16 DIAGNOSIS — M25551 Pain in right hip: Secondary | ICD-10-CM

## 2017-07-16 NOTE — Therapy (Signed)
[place] [facility] PHYSICAL [facility]TS MEDICINE [address] [place], [address] Phone: [phone]   Fax:  [phone]  Physical Therapy Treatment  Patient Details  Name: Hannah Yu MRN: [id number] Date of Birth: 10/01/1945 No Data Recorded  Encounter Date: 07/16/2017  PT End of Session - 07/16/17 1531    Visit Number  11    Number of Visits  17    Date for PT Re-Evaluation  07/22/17    PT Start Time  1418    PT Stop Time  1505    PT Time Calculation (min)  47 min    Activity Tolerance  Patient tolerated treatment well    Behavior During Therapy  [facility] for tasks assessed/performed       Past Medical History:  Diagnosis Date  . Difficult intubation   . Hypertension     Past Surgical History:  Procedure Laterality Date  . ABDOMINAL HYSTERECTOMY    . CATARACT EXTRACTION W/ INTRAOCULAR LENS IMPLANT Bilateral 2007   eyes done within 1 month of each other.  Marland Kitchen SHOULDER ARTHROSCOPY WITH OPEN ROTATOR CUFF REPAIR Right 2012    There were no vitals filed for this visit.  Subjective Assessment - 07/16/17 1420    Subjective  Pt reports approximately 5/10 R lateral thigh pain upon arrival today. No back pain currently reported. No health changes since last appointment. No specific questions or concerns at this time.     Pertinent History  Patient is a pleasant 72y/o with referral for lumbar stenosis and rediculopathy. Patient reports a negative experience with physical therapy that she attributes to being the cause of her detrimental back pain.  Patient reports that she continued to tell PT  that she was in pain and felt as though PT progressed her too quickly. Patient has negative perception of movement and  correlates exercise to pain, and therefore will benefit from continuous education on pain science and a graded exercise approach.     Limitations  Walking;Standing;Sitting    How long can you sit comfortably?     How long can you  stand comfortably?     How long can you walk comfortably?  Cannot walk without pain    Currently in Pain?  Yes    Pain Score  5     Pain Location  Leg    Pain Orientation  Right    Pain Descriptors / Indicators  Radiating    Pain Type  Chronic pain    Pain Onset  More than a month ago         TREATMENT  Ther-ex  NuStep L1 x 5 mins during history intake (3 minutes unbilled); Wall sits to patient selected depth with pball behind back 8s, 10s, 14s, 16s, 20s Total Gym L17 squats x 10, L19 x 10,  Total Gym L10 R single leg squats x 5, discontinued secondary to R posterior hip pain; Seated LAQ and HS curls with manual resistance 2 x 10 each bilateral; Attempted RLE SLR however pt is unable to perform due to RLE weakness and pain; Attempted hooklying marches however pt unable to perform due to RLE weakness and pain; Extensive education to patient regarding spinal stenosis;  Manual Therapy  Supine RLE long axis distraction with belt assist 30s/bout x 4 bouts; Attempted RLE inferior hip mobilizations at 90 flexion however discontinued secondary to pain; R hip medial to lateral hip mobilizations with belt assist in hooklying position 30s/bout x 4 bouts;  PT Education - 07/16/17 1530    Education provided  Yes    Education Details  exercise form/technique    Person(s) Educated  Patient    Methods  Explanation    Comprehension  Verbalized understanding       PT Short Term Goals - 05/27/17 1526      PT SHORT TERM GOAL #1   Title  Patient will be 100% proficient with HEP in order to maintain PT progress    Time  4    Period  Weeks    Status  New      PT SHORT TERM GOAL #2   Title  Patient will complete a regular aerobic exercise program at least 6430min/week in order to increase muscular endurance needed for prolonged ambulation    Time  4    Period  Weeks    Status  New        PT Long Term Goals - 05/27/17 1527      PT LONG  TERM GOAL #1   Title  Patient will report hip pain decrease by 3 points on NPRS to be able to participate in ADL's     Time  8    Period  Weeks    Status  New      PT LONG TERM GOAL #2   Title  Patient will demonstrate bilateral, symmetrical gross 4/5 hip strength in order to negotiate stairs safely    Time  8    Period  Weeks    Status  New            Plan - 07/16/17 1532    Clinical Impression Statement  Pt demonstrates excellent motivation with therapist on this date. She struggles with single leg squats on Total Gym but is able to perform bilateral squats without pain. Gradually increasing endurance with wall squats with repetitions. Pt reports relief of pain with long axis R hip distraction. Attempted inferior mobs at 90 degrees hip flexion but pt reports increase in pain. Pt also reports pain relief with medial to lateral R hip joint mobs. Pt encouraged to continue HEP and follow-up as scheduled.     Clinical Decision Making  Moderate    PT Frequency  2x / week    PT Duration  8 weeks    PT Treatment/Interventions  Aquatic Therapy;Cryotherapy;Electrical Stimulation;Moist Heat;Traction;Ultrasound;Gait training;Stair training;Functional mobility training;Neuromuscular re-education;Balance training;Therapeutic exercise;Therapeutic activities;Patient/family education;Manual techniques;Dry needling;Taping    PT Next Visit Plan  Continue grading exercise approach for hip strengthening (hip flexor focus as well),  manual techniques and modalities for pain management    PT Home Exercise Plan  Bridges, clamshell, standing hip flexor stretch and seated pirformis stretch, aerobic program; added 2/21 wall sits 20sec holds    Consulted and Agree with Plan of Care  Patient       Patient will benefit from skilled therapeutic intervention in order to improve the following deficits and impairments:  Abnormal gait, Decreased endurance, Decreased mobility, Difficulty walking, Hypomobility, Impaired  perceived functional ability, Decreased activity tolerance, Decreased strength, Increased fascial restricitons  Visit Diagnosis: Chronic right-sided low back pain with right-sided sciatica  Pain in right hip     Problem List There are no active problems to display for this patient.  Lynnea MaizesJason D Oz Gammel PT, DPT   Zana Biancardi 07/16/2017, 4:02 PM  [place] [facility] [address] [place], [place], [address] Phone: [phone]   Fax:  [phone]  Name: Hannah Yu  MRN: [id number] Date of Birth: 10-Dec-1945

## 2017-07-20 ENCOUNTER — Encounter: Payer: Medicare Other | Admitting: Physical Therapy

## 2017-07-22 ENCOUNTER — Encounter: Payer: Self-pay | Admitting: Physical Therapy

## 2017-07-22 ENCOUNTER — Ambulatory Visit: Payer: Medicare Other | Admitting: Physical Therapy

## 2017-07-22 DIAGNOSIS — G8929 Other chronic pain: Secondary | ICD-10-CM

## 2017-07-22 DIAGNOSIS — M25551 Pain in right hip: Secondary | ICD-10-CM

## 2017-07-22 DIAGNOSIS — M5441 Lumbago with sciatica, right side: Secondary | ICD-10-CM

## 2017-07-22 NOTE — Therapy (Signed)
[place] [facility] PHYSICAL AND SPORTS MEDICINE [address], [place], [address] Phone: [phone]   Fax:  [phone]  Physical Therapy Treatment  Patient Details  Name: Hannah Yu MRN: [id number] Date of Birth: 07-Feb-1946 No Data Recorded  Encounter Date: 07/22/2017  PT End of Session - 07/22/17 1346    Visit Number  12    Number of Visits  17    Date for PT Re-Evaluation  07/22/17    Authorization Type  Medicare    PT Start Time  0100    PT Stop Time  0145    PT Time Calculation (min)  45 min    Activity Tolerance  Patient tolerated treatment well    Behavior During Therapy  [facility] for tasks assessed/performed       Past Medical History:  Diagnosis Date  . Difficult intubation   . Hypertension     Past Surgical History:  Procedure Laterality Date  . ABDOMINAL HYSTERECTOMY    . CATARACT EXTRACTION W/ INTRAOCULAR LENS IMPLANT Bilateral 2007   eyes done within 1 month of each other.  Marland Kitchen SHOULDER ARTHROSCOPY WITH OPEN ROTATOR CUFF REPAIR Right 2012    There were no vitals filed for this visit.  Subjective Assessment - 07/22/17 1302    Subjective  Patient reports some soreness after last session that subsided 24 hours following session. Patient reports R lateral thigh pain 6/10 that began yesterday am. Patient reports she able to do her HEP without any pain.     Pertinent History  Patient is a pleasant 71y/o with referral for lumbar stenosis and rediculopathy. Patient reports a negative experience with physical therapy that she attributes to being the cause of her detrimental back pain.  Patient reports that she continued to tell PT  that she was in pain and felt as though PT progressed her too quickly. Patient has negative perception of movement and  correlates exercise to pain, and therefore will benefit from continuous education on pain science and a graded exercise approach.     Limitations  Walking;Standing;Sitting    How long  can you sit comfortably?     How long can you stand comfortably?     How long can you walk comfortably?  Cannot walk without pain    Pain Onset  More than a month ago       Ther-Ex  -Bike 4 mins during hx intake (pt was able to go all the way around without use of hands to R LE) unbilled -Supine R butterfly groin stretch 2 min hold (added to HEP 3x 30sec hold) -Mini squats 2x 10 from elevated mat table- patient with LBP with second set so discontinued -Bridge exercise 1x 12; 2x 12 with yellow tband at knees with cuing for eccentric control -Piriformis  Stretch seated 3x 30sec holds (added to HEP/reviewed and modified)     Manual -STM and trigger point release to R glute med/ITB with noted tissue release -R manual Ely's stretch with hip ext 3x 45 sec holds -In supine R butterfly position: adductor STM and trigger point release  With muscle spasm that subsided after sustained pressure -Ober's stretch for glute med w/ cross friction massage to distal TFL 3x 30sec w/ only mild tenderness noted today -Long axis hip distraction 10sec hold w/ 10sec release x8                        PT Education - 07/22/17  1324    Education provided  Yes    Education Details  Exercise form; HEP addition     Person(s) Educated  Patient    Methods  Handout;Tactile cues;Verbal cues;Explanation    Comprehension  Verbalized understanding;Returned demonstration;Verbal cues required;Tactile cues required       PT Short Term Goals - 05/27/17 1526      PT SHORT TERM GOAL #1   Title  Patient will be 100% proficient with HEP in order to maintain PT progress    Time  4    Period  Weeks    Status  New      PT SHORT TERM GOAL #2   Title  Patient will complete a regular aerobic exercise program at least 1330min/week in order to increase muscular endurance needed for prolonged ambulation    Time  4    Period  Weeks    Status  New        PT Long Term Goals - 05/27/17 1527       PT LONG TERM GOAL #1   Title  Patient will report hip pain decrease by 3 points on NPRS to be able to participate in ADL's     Time  8    Period  Weeks    Status  New      PT LONG TERM GOAL #2   Title  Patient will demonstrate bilateral, symmetrical gross 4/5 hip strength in order to negotiate stairs safely    Time  8    Period  Weeks    Status  New            Plan - 07/22/17 1354    Clinical Impression Statement  Pt had increased muscle soreness and tension today in R hip flexors/knee extensors and adductors. STM at R adductor revealed muscle spasm that subsided with trigger point release. Patient was given a stretch for adductor muscles as well as reinforcement of hip flexor stretching.  Patient tolerated stretching therex well with noted discomfort and tension relief following, but did not tolerate resistance exercise as well as in the past d/t increased soreness/tension. PT will continue to add in strengthening exercises as tolerated by patient.     PT Frequency  2x / week    PT Duration  8 weeks    PT Treatment/Interventions  Aquatic Therapy;Cryotherapy;Electrical Stimulation;Moist Heat;Traction;Ultrasound;Gait training;Stair training;Functional mobility training;Neuromuscular re-education;Balance training;Therapeutic exercise;Therapeutic activities;Patient/family education;Manual techniques;Dry needling;Taping    PT Home Exercise Plan  Bridges, clamshell, standing hip flexor stretch and seated pirformis stretch, aerobic program; added 2/21 wall sits 20sec holds    Consulted and Agree with Plan of Care  Patient       Patient will benefit from skilled therapeutic intervention in order to improve the following deficits and impairments:  Abnormal gait, Decreased endurance, Decreased mobility, Difficulty walking, Hypomobility, Impaired perceived functional ability, Decreased activity tolerance, Decreased strength, Increased fascial restricitons  Visit Diagnosis: Pain in right  hip  Chronic right-sided low back pain with right-sided sciatica     Problem List There are no active problems to display for this patient.   Staci AcostaChelsea Miller 07/22/2017, 2:10 PM  [place] [facility] [address] [place], [place], [address] Phone: [phone]   Fax:  [phone]  Name: Hannah Yu MRN: [id number] Date of Birth: 02/13/1946

## 2017-07-27 ENCOUNTER — Encounter: Payer: Medicare Other | Admitting: Physical Therapy

## 2017-07-30 ENCOUNTER — Ambulatory Visit: Payer: Medicare Other | Admitting: Physical Therapy

## 2017-07-30 DIAGNOSIS — M5441 Lumbago with sciatica, right side: Secondary | ICD-10-CM | POA: Diagnosis not present

## 2017-07-30 DIAGNOSIS — M25551 Pain in right hip: Secondary | ICD-10-CM

## 2017-07-30 NOTE — Therapy (Signed)
[facility] PHYS[facility] AND SPORTS MEDICINE [address] , [place], [address] Phone: [phone]   Fax:  [phone]  Physical Therapy Treatment  Patient Details  Name: Hannah Yu MRN: [id number] Date of Birth: 03/27/1946 No data recorded  Encounter Date: 07/30/2017  PT End of Session - 07/30/17 1355    Visit Number  13    Number of Visits  17    Date for PT Re-Evaluation  07/22/17    Authorization Type  Medicare    PT Start Time  0100    PT Stop Time  0143    PT Time Calculation (min)  43 min    Activity Tolerance  Patient tolerated treatment well    Behavior During Therapy  [facility] for tasks assessed/performed       Past Medical History:  Diagnosis Date  . Difficult intubation   . Hypertension     Past Surgical History:  Procedure Laterality Date  . ABDOMINAL HYSTERECTOMY    . CATARACT EXTRACTION W/ INTRAOCULAR LENS IMPLANT Bilateral 2007   eyes done within 1 month of each other.  Marland Kitchen. SHOULDER ARTHROSCOPY WITH OPEN ROTATOR CUFF REPAIR Right 2012    There were no vitals filed for this visit.  Subjective Assessment - 07/30/17 1308    Subjective  Patient reports she has only had pain between 2-3/10 in the past couple weeks which is the best her pain has been in 2 years. Patient reports she is no longer having pain down the LE with numbness and tingling, and reports solely having some tightness in the anterior and lateral thigh. Patient reports she is able to walk around grocery store with pain 3/10 and has no limitation in sitting. Patient feels as though she has made good progress with PT and asks if she feels as though she can continue at home with robust HEP to save PT visits in case she has hip surgery this year.     Pertinent History  Patient is a pleasant 72y/o with referral for lumbar stenosis and rediculopathy. Patient reports a negative experience with physical therapy that she attributes to being the cause of her  detrimental back pain.  Patient reports that she continued to tell PT  that she was in pain and felt as though PT progressed her too quickly. Patient has negative perception of movement and  correlates exercise to pain, and therefore will benefit from continuous education on pain science and a graded exercise approach.     Limitations  Walking;Standing;Sitting    How long can you sit comfortably?  30min    How long can you stand comfortably?  10mins    How long can you walk comfortably?  Cannot walk without pain    Pain Onset  More than a month ago        Ther-Ex -Nustep 4 mins during hx intake (unbilled -Standing hip flexion stretch 30sec hold -Standing stair hip flexion stretch 30sec hold -Sidelying glute med stretch 2x 30sec hold with cuing initially for proper form -Standing glute med stretch leaning against wall 2x 30sec hold with cuing needed for proper form for first set -Piriformis stretch (seated) 30sec hold (HEP review) with no cuing needed for proper form -Hamstring stretch (seated) 30sec hold (HEP review) with no cuing needed for proper form -SLR 1x 10 4in lift with patient educated on quad leg and to only complete in range without quad lag and for eccentric control -Long arc quad 1x 10 with 3 sec holds  with instruction on how to add theraband for resistance if patient is is able to complete over 12 reps without fatigue -Wall sit 30sec hold, with instruction to increase time as able with proper form and to not compromise form for time -SLR 1x 10 4in lift with patient educated  to only complete in range she can complete without hip ER compensation and with eccentric control -Clamshell 1x 10 (HEP review) with education on increasing band resistance -Mini squat (HEP review) with patient needing no cuing to complete 10reps education on how to make exercise more intense with increasing ROM  *Education on progressing exercise as able, and range for reps/sets for strength gains, and  stretching program*         No data recorded               PT Education - 07/30/17 1354    Education provided  Yes    Education Details  Given robust HEP with education on increasing/decreasing intensity as needed, stretching parameters, proper form, and rep/set parameters    Person(s) Educated  Patient    Methods  Explanation;Demonstration;Verbal cues;Handout    Comprehension  Verbal cues required;Returned demonstration;Verbalized understanding       PT Short Term Goals - 07/30/17 1357      PT SHORT TERM GOAL #1   Status  Achieved      PT SHORT TERM GOAL #2   Status  Achieved        PT Long Term Goals - 07/30/17 1357      PT LONG TERM GOAL #1   Title  Patient will report hip pain decrease by 3 points on NPRS to be able to participate in ADL's     Baseline  3/27: hip pain worst 3/10 (at eval 8/10 with radiation)    Status  Achieved      PT LONG TERM GOAL #2   Title  Patient will demonstrate bilateral, symmetrical gross 4/5 hip strength in order to negotiate stairs safely    Baseline  3/27 R gross R hip strength 4/5; 4+/5 on L; able to stand/sit 10x without use of UE and with no pain from standard chair    Status  Achieved            Plan - 07/30/17 1443    Clinical Impression Statement  Pt reports she is feeling much better since starting PT and only has c/o some mild weakness and decreased endurance at this time. PT and patient reviewed robust HEP for strengthening/stretching program, which patient was able to complete with correct form and no pain. D/t patient's concerns about having enough appointments left for possible future hip surgery, PT and pt have agreed patient is safe to d/c to HEP at this time, with encouragement to call/return to this clinic if any problems/pain arises. Patient acknowledges and agrees to new POC and will follow up if needed.     PT Frequency  2x / week    PT Duration  8 weeks    PT Treatment/Interventions  Aquatic  Therapy;Cryotherapy;Electrical Stimulation;Moist Heat;Traction;Ultrasound;Gait training;Stair training;Functional mobility training;Neuromuscular re-education;Balance training;Therapeutic exercise;Therapeutic activities;Patient/family education;Manual techniques;Dry needling;Taping    PT Next Visit Plan  Continue grading exercise approach for hip strengthening (hip flexor focus as well),  manual techniques and modalities for pain management    PT Home Exercise Plan  Bridges, clamshell, standing hip flexor stretch and seated pirformis stretch, aerobic program; added 2/21 wall sits 20sec holds    Consulted and Agree with Plan of  Care  Patient       Patient will benefit from skilled therapeutic intervention in order to improve the following deficits and impairments:  Abnormal gait, Decreased endurance, Decreased mobility, Difficulty walking, Hypomobility, Impaired perceived functional ability, Decreased activity tolerance, Decreased strength, Increased fascial restricitons  Visit Diagnosis: Pain in right hip     Problem List There are no active problems to display for this patient.  Staci Acosta PT, DPT Staci Acosta 07/30/2017, 3:56 PM  [place] [facility] PHYSICAL AND SPORTS MEDICINE [address] [place], [address] Phone: [phone]   Fax:  [phone]  Name: Hannah Yu MRN: [id number] Date of Birth: Nov 18, 1945

## 2017-07-30 NOTE — Therapy (Signed)
[place] [facility]IC[facility] [address] [place], [place], [address] Phone: [phone]   Fax:  [phone]  Patient Details  Name: Evalina FieldMargaret T Gotts MRN: [id number] Date of Birth: 07/09/1945 Referring Provider:  Denton LankMeeler, Whitney L, FNP  Encounter Date: 07/30/2017   [name] 07/30/2017, 1:15 PM  [place] [facility] PHYSICAL AND SPORTS MEDICINE [address] [place], [place], [address] Phone: [phone]   Fax:  [phone]

## 2017-08-17 ENCOUNTER — Other Ambulatory Visit: Payer: Self-pay | Admitting: Infectious Diseases

## 2017-08-17 DIAGNOSIS — Z1231 Encounter for screening mammogram for malignant neoplasm of breast: Secondary | ICD-10-CM

## 2017-09-07 ENCOUNTER — Other Ambulatory Visit: Payer: Self-pay | Admitting: Infectious Diseases

## 2017-09-07 ENCOUNTER — Ambulatory Visit
Admission: RE | Admit: 2017-09-07 | Discharge: 2017-09-07 | Disposition: A | Discharge status: Home / Self Care | Payer: Medicare Other | Source: Ambulatory Visit | Attending: Infectious Diseases | Admitting: Infectious Diseases

## 2017-09-07 DIAGNOSIS — Z1231 Encounter for screening mammogram for malignant neoplasm of breast: Secondary | ICD-10-CM | POA: Insufficient documentation

## 2018-10-08 ENCOUNTER — Other Ambulatory Visit: Payer: Self-pay | Admitting: Family Medicine

## 2018-10-08 DIAGNOSIS — Z1231 Encounter for screening mammogram for malignant neoplasm of breast: Secondary | ICD-10-CM

## 2018-11-19 ENCOUNTER — Other Ambulatory Visit: Payer: Self-pay

## 2018-11-19 ENCOUNTER — Ambulatory Visit
Admission: RE | Admit: 2018-11-19 | Discharge: 2018-11-19 | Disposition: A | Discharge status: Home / Self Care | Payer: Medicare Other | Source: Ambulatory Visit | Attending: Family Medicine | Admitting: Family Medicine

## 2018-11-19 DIAGNOSIS — Z1231 Encounter for screening mammogram for malignant neoplasm of breast: Secondary | ICD-10-CM

## 2018-11-23 ENCOUNTER — Other Ambulatory Visit: Payer: Self-pay | Admitting: Family Medicine

## 2018-11-23 DIAGNOSIS — R928 Other abnormal and inconclusive findings on diagnostic imaging of breast: Secondary | ICD-10-CM

## 2018-11-23 DIAGNOSIS — N6489 Other specified disorders of breast: Secondary | ICD-10-CM

## 2018-12-01 ENCOUNTER — Ambulatory Visit
Admission: RE | Admit: 2018-12-01 | Discharge: 2018-12-01 | Disposition: A | Discharge status: Home / Self Care | Payer: Medicare Other | Source: Ambulatory Visit | Attending: Family Medicine | Admitting: Family Medicine

## 2018-12-01 DIAGNOSIS — R928 Other abnormal and inconclusive findings on diagnostic imaging of breast: Secondary | ICD-10-CM | POA: Insufficient documentation

## 2018-12-01 DIAGNOSIS — N6489 Other specified disorders of breast: Secondary | ICD-10-CM | POA: Diagnosis present

## 2019-05-25 ENCOUNTER — Ambulatory Visit: Payer: Medicare Other | Attending: Internal Medicine

## 2019-05-25 DIAGNOSIS — Z23 Encounter for immunization: Secondary | ICD-10-CM | POA: Insufficient documentation

## 2019-05-25 NOTE — Progress Notes (Signed)
   Covid-19 Vaccination Clinic  Name:  Hannah Yu    MRN: [id number] DOB: 12-02-1945  05/25/2019  Ms. Hannah Yu was observed post Covid-19 immunization for 15 minutes without incidence. She was provided with Vaccine Information Sheet and instruction to access the V-Safe system.   Ms. Hannah Yu was instructed to call 911 with any severe reactions post vaccine: Marland Kitchen Difficulty breathing  . Swelling of your face and throat  . A fast heartbeat  . A bad rash all over your body  . Dizziness and weakness    Immunizations Administered    Name Date Dose VIS Date Route   Pfizer COVID-19 Vaccine 05/25/2019  5:09 PM 0.3 mL 04/15/2019 Intramuscular   Manufacturer: [employer], [employer]   Lot: [id number]   NDC: [address]-4

## 2019-06-13 ENCOUNTER — Ambulatory Visit: Payer: Medicare Other | Attending: Internal Medicine

## 2019-06-13 DIAGNOSIS — Z23 Encounter for immunization: Secondary | ICD-10-CM

## 2019-06-13 NOTE — Progress Notes (Signed)
   Covid-19 Vaccination Clinic  Name:  DIASHA CASTLEMAN    MRN: [id number] DOB: 10/18/1945  06/13/2019  Ms. Gadison was observed post Covid-19 immunization for 15 minutes without incidence. She was provided with Vaccine Information Sheet and instruction to access the V-Safe system.   Ms. Scheu was instructed to call 911 with any severe reactions post vaccine: Marland Kitchen Difficulty breathing  . Swelling of your face and throat  . A fast heartbeat  . A bad rash all over your body  . Dizziness and weakness    Immunizations Administered    Name Date Dose VIS Date Route   Pfizer COVID-19 Vaccine 06/13/2019 11:19 AM 0.3 mL 04/15/2019 Intramuscular   Manufacturer: [employer], [employer]   Lot: [id number]   NDC: [address]-7

## 2019-07-20 ENCOUNTER — Encounter: Payer: Self-pay | Admitting: Orthopaedic Surgery

## 2019-07-20 ENCOUNTER — Ambulatory Visit (INDEPENDENT_AMBULATORY_CARE_PROVIDER_SITE_OTHER): Payer: Medicare Other | Admitting: Orthopaedic Surgery

## 2019-07-20 ENCOUNTER — Ambulatory Visit (INDEPENDENT_AMBULATORY_CARE_PROVIDER_SITE_OTHER): Payer: Medicare Other

## 2019-07-20 ENCOUNTER — Other Ambulatory Visit: Payer: Self-pay

## 2019-07-20 VITALS — Ht 61.0 in | Wt 173.0 lb

## 2019-07-20 DIAGNOSIS — M1611 Unilateral primary osteoarthritis, right hip: Secondary | ICD-10-CM | POA: Diagnosis not present

## 2019-07-20 DIAGNOSIS — M7062 Trochanteric bursitis, left hip: Secondary | ICD-10-CM

## 2019-07-20 DIAGNOSIS — E538 Deficiency of other specified B group vitamins: Secondary | ICD-10-CM | POA: Insufficient documentation

## 2019-07-20 DIAGNOSIS — R102 Pelvic and perineal pain: Secondary | ICD-10-CM | POA: Insufficient documentation

## 2019-07-20 DIAGNOSIS — E785 Hyperlipidemia, unspecified: Secondary | ICD-10-CM | POA: Insufficient documentation

## 2019-07-20 MED ORDER — METHYLPREDNISOLONE ACETATE 40 MG/ML IJ SUSP
40.0000 mg | INTRAMUSCULAR | Status: AC | PRN
  Administered 2019-07-20: 40 mg via INTRA_ARTICULAR

## 2019-07-20 MED ORDER — LIDOCAINE HCL 1 % IJ SOLN
3.0000 mL | INTRAMUSCULAR | Status: AC | PRN
  Administered 2019-07-20: 3 mL

## 2019-07-20 NOTE — Progress Notes (Signed)
Office Visit Note   Patient: Hannah Yu           Date of Birth: December 27, 1945           MRN: [id number] Visit Date: 07/20/2019              Requested by: Jerl Mina, MD [address] [facility] [place],  [place] [address] PCP: Jerl Mina, MD   Assessment & Plan: Visit Diagnoses:  1. Trochanteric bursitis of left hip   2. Primary osteoarthritis of right hip     Plan: Due to patient's severe hip pain on the right fact that it affects her activities of daily living and the fact that she has failed conservative treatment recommend right total hip arthroplasty.  She would like to proceed with this near future.  We will wait at least 4 weeks from the intra-articular injection to perform right total hip surgery.  Risk benefits of surgery discussed with patient at length.  These include but are not limited to prolonged pain, worsening pain, DVT/PE, nerve/vessel injury, wound healing problems, infection and leg length discrepancy.  Questions were encouraged and answered by Dr. Magnus Ivan myself.  We will see her back 2 weeks postop.  Handout on total hip arthroplasty given patient.  Follow-Up Instructions: Return 2 weeks postop.   Orders:  Orders Placed This Encounter  Procedures  . Large Joint Inj  . XR HIPS BILAT W OR W/O PELVIS 2V   No orders of the defined types were placed in this encounter.     Procedures: Large Joint Inj: L greater trochanter on 07/20/2019 4:41 PM Indications: pain Details: 22 G 1.5 in needle, lateral approach  Arthrogram: No  Medications: 3 mL lidocaine 1 %; 40 mg methylPREDNISolone acetate 40 MG/ML Outcome: tolerated well, no immediate complications Procedure, treatment alternatives, risks and benefits explained, specific risks discussed. Consent was given by the patient. Immediately prior to procedure a time out was called to verify the correct patient, procedure, equipment, support staff and site/side marked as required. Patient was  prepped and draped in the usual sterile fashion.       Clinical Data: No additional findings.   Subjective: Chief Complaint  Patient presents with  . Left Hip - Pain  . Right Hip - Pain    HPI Hannah Yu is a 74 year old female were seen for the first time for bilateral hip pain right greater than left.  Right hip pain has been present for the last 2 years.  Left hip pain which is started recently.  She has deep right hip pain.  Some pain in the left hip groin area but also pain lateral aspect hip.  She had a intra-articular injection right hip March 1 and states this is helped.  Right hip pain awakens her on some nights.  She has difficulty donning shoes and socks on the right.  She denies any injury to either hip.  Ambulates with a cane in her left hand.  Review of Systems  Constitutional: Negative for chills and fever.  Respiratory: Negative for shortness of breath.   Musculoskeletal: Positive for arthralgias and gait problem.     Objective: Vital Signs: Ht 5\' 1"  (1.549 m)   Wt 173 lb (78.5 kg)   BMI 32.69 kg/m   Physical Exam Constitutional:      Appearance: She is not ill-appearing or diaphoretic.  Pulmonary:     Effort: Pulmonary effort is normal.  Neurological:     Mental Status: She is  alert and oriented to person, place, and time.  Psychiatric:        Mood and Affect: Mood normal.        Behavior: Behavior normal.     Ortho Exam Left hip good range of motion without pain.  Tenderness over the left trochanteric region.  Right hip painful and limited internal rotation.  Ambulates with a antalgic gait with a cane in left hand.  Leg length discrepancy with the right leg being shorter than the left. Specialty Comments:  No specialty comments available.  Imaging: XR HIPS BILAT W OR W/O PELVIS 2V  Result Date: 07/20/2019 AP pelvis lateral view right hip: Severe end-stage arthritic changes in the right hip with misshapened head and high riding right femoral  head.  No acute fractures.  Left hip well located.  Slight narrowing of the hip joint.    PMFS History: Patient Active Problem List   Diagnosis Date Noted  . Pelvic pain in female 07/20/2019  . Hyperlipidemia 07/20/2019  . B12 deficiency 07/20/2019  . Neuropathic pain of left hand 06/23/2017  . Spinal stenosis of lumbosacral region 11/11/2016  . Primary osteoarthritis of right hip 11/11/2016  . Obesity (BMI 30.0-34.9) 09/12/2016  . H/O supraventricular tachycardia 01/14/2016  . Hypertension 12/24/2015  . Back pain with right-sided sciatica 12/24/2015  . Gallstone 10/19/2015  . Biliary colic 24/23/5361  . Diverticulitis 06/28/2014   Past Medical History:  Diagnosis Date  . Difficult intubation   . Hypertension     Family History  Problem Relation Age of Onset  . Breast cancer Sister 6    Past Surgical History:  Procedure Laterality Date  . ABDOMINAL HYSTERECTOMY    . CATARACT EXTRACTION W/ INTRAOCULAR LENS IMPLANT Bilateral 2007   eyes done within 1 month of each other.  Marland Kitchen SHOULDER ARTHROSCOPY WITH OPEN ROTATOR CUFF REPAIR Right 2012   Social History   Occupational History  . Not on file  Tobacco Use  . Smoking status: Former Smoker    Quit date: 01/02/1993    Years since quitting: 26.5  . Smokeless tobacco: Never Used  Substance and Sexual Activity  . Alcohol use: Yes    Alcohol/week: 4.0 standard drinks    Types: 4 Glasses of wine per week  . Drug use: No  . Sexual activity: Not on file

## 2019-07-22 ENCOUNTER — Encounter: Payer: Self-pay | Admitting: Orthopaedic Surgery

## 2019-07-26 ENCOUNTER — Other Ambulatory Visit: Payer: Self-pay

## 2019-08-15 ENCOUNTER — Other Ambulatory Visit: Payer: Self-pay | Admitting: Infectious Diseases

## 2019-08-15 DIAGNOSIS — Z1231 Encounter for screening mammogram for malignant neoplasm of breast: Secondary | ICD-10-CM

## 2019-09-01 NOTE — Progress Notes (Signed)
[facility] - [place], [place] - [address] [address] Phone: [phone] Fax: [phone]      Your procedure is scheduled on Tuesday May 4  Report to [facility] Main Entrance "A" at 1000 A.M., and check in at the Admitting office.  Call this number if you have problems the morning of surgery:  [phone]  Call [phone] if you have any questions prior to your surgery date Monday-Friday 8am-4pm    Remember:  Do not eat or drink after midnight the night before your surgery    Take these medicines the morning of surgery with A SIP OF WATER  amLODipine (NORVASC)  cyclobenzaprine (FLEXERIL) metoprolol succinate (TOPROL-XL)  omeprazole (PRILOSEC) traMADol (ULTRAM) if needed  Follow your surgeon's instructions on when to stop Aspirin.  If no instructions were given by your surgeon then you will need to call the office to get those instructions.    As of today, STOP taking any Aspirin (unless otherwise instructed by your surgeon) and Aspirin containing products, Aleve, Naproxen, Ibuprofen, Motrin, Advil, Goody's, BC's, all herbal medications, fish oil, and all vitamins.             Do not wear jewelry, make up, or nail polish            Do not wear lotions, powders, perfumes/colognes, or deodorant.            Do not shave 48 hours prior to surgery.             Do not bring valuables to the hospital.            [facility] is not responsible for any belongings or valuables.  Do NOT Smoke (Tobacco/Vapping) or drink Alcohol 24 hours prior to your procedure If you use a CPAP at night, you may bring all equipment for your overnight stay.   Contacts, glasses, dentures or bridgework may not be worn into surgery.      For patients admitted to the hospital, discharge time will be determined by your treatment team.   Patients discharged the day of surgery will not be allowed to drive home, and someone needs to stay with them for 24  hours.    Special instructions:   [facility]- Preparing For Surgery  Before surgery, you can play an important role. Because skin is not sterile, your skin needs to be as free of germs as possible. You can reduce the number of germs on your skin by washing with CHG (chlorahexidine gluconate) Soap before surgery.  CHG is an antiseptic cleaner which kills germs and bonds with the skin to continue killing germs even after washing.    Oral Hygiene is also important to reduce your risk of infection.  Remember - BRUSH YOUR TEETH THE MORNING OF SURGERY WITH YOUR REGULAR TOOTHPASTE  Please do not use if you have an allergy to CHG or antibacterial soaps. If your skin becomes reddened/irritated stop using the CHG.  Do not shave (including legs and underarms) for at least 48 hours prior to first CHG shower. It is OK to shave your face.  Please follow these instructions carefully.   1. Shower the NIGHT BEFORE SURGERY and the MORNING OF SURGERY with CHG Soap.   2. If you chose to wash your hair, wash your hair first as usual with your normal shampoo.  3. After you shampoo, rinse your hair and body thoroughly to remove the shampoo.  4. Use CHG as  you would any other liquid soap. You can apply CHG directly to the skin and wash gently with a scrungie or a clean washcloth.   5. Apply the CHG Soap to your body ONLY FROM THE NECK DOWN.  Do not use on open wounds or open sores. Avoid contact with your eyes, ears, mouth and genitals (private parts). Wash Face and genitals (private parts)  with your normal soap.   6. Wash thoroughly, paying special attention to the area where your surgery will be performed.  7. Thoroughly rinse your body with warm water from the neck down.  8. DO NOT shower/wash with your normal soap after using and rinsing off the CHG Soap.  9. Pat yourself dry with a CLEAN TOWEL.  10. Wear CLEAN PAJAMAS to bed the night before surgery, wear comfortable clothes the morning of  surgery  11. Place CLEAN SHEETS on your bed the night of your first shower and DO NOT SLEEP WITH PETS.   Day of Surgery:   Do not apply any deodorants/lotions.  Please wear clean clothes to the hospital/surgery center.   Remember to brush your teeth WITH YOUR REGULAR TOOTHPASTE.   Please read over the following fact sheets that you were given.

## 2019-09-02 ENCOUNTER — Encounter (HOSPITAL_COMMUNITY)
Admission: RE | Admit: 2019-09-02 | Discharge: 2019-09-02 | Disposition: A | Discharge status: Home / Self Care | Payer: Medicare Other | Source: Ambulatory Visit | Attending: Orthopaedic Surgery | Admitting: Orthopaedic Surgery

## 2019-09-02 ENCOUNTER — Other Ambulatory Visit (HOSPITAL_COMMUNITY)
Admission: RE | Admit: 2019-09-02 | Discharge: 2019-09-02 | Disposition: A | Discharge status: Home / Self Care | Payer: Medicare Other | Source: Ambulatory Visit | Attending: Orthopaedic Surgery | Admitting: Orthopaedic Surgery

## 2019-09-02 ENCOUNTER — Other Ambulatory Visit: Payer: Self-pay

## 2019-09-02 ENCOUNTER — Telehealth: Payer: Self-pay | Admitting: Orthopaedic Surgery

## 2019-09-02 ENCOUNTER — Encounter (HOSPITAL_COMMUNITY): Payer: Self-pay

## 2019-09-02 DIAGNOSIS — Z01812 Encounter for preprocedural laboratory examination: Secondary | ICD-10-CM | POA: Insufficient documentation

## 2019-09-02 DIAGNOSIS — Z20822 Contact with and (suspected) exposure to covid-19: Secondary | ICD-10-CM | POA: Diagnosis not present

## 2019-09-02 HISTORY — DX: Gastro-esophageal reflux disease without esophagitis: K21.9

## 2019-09-02 HISTORY — DX: Unspecified osteoarthritis, unspecified site: M19.90

## 2019-09-02 HISTORY — DX: Sciatica, unspecified side: M54.30

## 2019-09-02 HISTORY — DX: Other complications of anesthesia, initial encounter: T88.59XA

## 2019-09-02 LAB — BASIC METABOLIC PANEL
Anion gap: 10 (ref 5–15)
BUN: 17 mg/dL (ref 8–23)
CO2: 28 mmol/L (ref 22–32)
Calcium: 9.4 mg/dL (ref 8.9–10.3)
Chloride: 99 mmol/L (ref 98–111)
Creatinine, Ser: 0.88 mg/dL (ref 0.44–1.00)
GFR calc Af Amer: >60 mL/min (ref >60)
GFR calc non Af Amer: >60 mL/min (ref >60)
Glucose, Bld: 107 mg/dL — ABNORMAL HIGH (ref 70–99)
Potassium: 3.9 mmol/L (ref 3.5–5.1)
Sodium: 137 mmol/L (ref 135–145)

## 2019-09-02 LAB — CBC
HCT: 44.4 % (ref 36.0–46.0)
Hemoglobin: 13.9 g/dL (ref 12.0–15.0)
MCH: 26.4 pg (ref 26.0–34.0)
MCHC: 31.3 g/dL (ref 30.0–36.0)
MCV: 84.3 fL (ref 80.0–100.0)
Platelets: 395 10*3/uL (ref 150–400)
RBC: 5.27 MIL/uL — ABNORMAL HIGH (ref 3.87–5.11)
RDW: 16 % — ABNORMAL HIGH (ref 11.5–15.5)
WBC: 10.4 10*3/uL (ref 4.0–10.5)
nRBC: 0 % (ref 0.0–0.2)

## 2019-09-02 LAB — SARS CORONAVIRUS 2 (TAT 6-24 HRS): SARS Coronavirus 2: NEGATIVE

## 2019-09-02 LAB — SURGICAL PCR SCREEN
MRSA, PCR: NEGATIVE
Staphylococcus aureus: POSITIVE — AB

## 2019-09-02 NOTE — Telephone Encounter (Signed)
Pt called stating she went to her pre op appt on 09/02/19 and wanted to know when she should stop taking her baby asprins? Pt would like a call back with this answer.   [phone]

## 2019-09-02 NOTE — Progress Notes (Signed)
PCP - Dr. Clydie Braun Cardiologist - denies  PPM/ICD - denies  Chest x-ray - N/A EKG - per patient "had one done few weeks at ago at doctor's office" - records requested Stress Test - 02/05/16 (C[facility]E.) ECHO - 02/04/16 (C.E) Cardiac Cath - per patient "30 or more years ago, no stent placed"  Sleep Study - denies CPAP - N/A  DM: denies  Blood Thinner Instructions: N/A Aspirin Instructions: Patient stated no instructions were given to her. Patient instructed to call Dr. Eliberto Ivory office today for instructions.  ERAS Protcol - No orders  COVID TEST- Scheduled for today 09/02/2019 after PAT appointment. Patient verbalized understanding of self-quarantine instructions, appointment time and place.  Anesthesia review: YES, hx of difficult intubation, EKG tracing requested.  Patient had a cancelled procedure back in 2017 due to elevated heart rate once intubated. Patient stated seen cardiologist Dr. Lady Gary and it was recommended to her to take double her dose of metoprolol in the future the day of a surgery. Patient stated that she plans to take two of her metoprolol the morning of surgery.  Patient denies shortness of breath, fever, cough and chest pain at PAT appointment  All instructions explained to the patient, with a verbal understanding of the material. Patient agrees to go over the instructions while at home for a better understanding. Patient also instructed to self quarantine after being tested for COVID-19. The opportunity to ask questions was provided.

## 2019-09-02 NOTE — Telephone Encounter (Signed)
LMOM for patient letting her know she doesn't have to d/c baby aspirin

## 2019-09-05 ENCOUNTER — Encounter (HOSPITAL_COMMUNITY): Payer: Self-pay

## 2019-09-05 ENCOUNTER — Other Ambulatory Visit: Payer: Self-pay | Admitting: Physician Assistant

## 2019-09-05 NOTE — Progress Notes (Addendum)
Anesthesia Chart Review:  Pt was scheduled for cholecystectomy 01/10/16 but procedure was aborted due to pt becoming tachycardic after induction (Hr in the 130s despite esmolol). Pt was seen by cardiology in PACU and recommendation was for better preop control with metoprolol. Pt subsequently followed up with cardiologist Dr. Lady Gary at [facility] and underwent additional testing.  Functional study showed normal LV function with no ischemia. Echocardiogram showed normal LV function with mild MR and mild TR. His recommendation was to continue metoprolol and she was cleared as low risk for future surgery. The patient states that she was advised to double her AM dose of metoprolol for any future surgeries and plans to do that for this one.   Pt was seen and cleared by PCP Dr. Sampson Goon 08/15/19. Per note, "Preop evaluation-she is being scheduled for hip replacement in May. We reviewed her prior stress test done in 2017. At that time she had an EKG showing possible poor R wave progression. There is no change in her EKG today. She has no symptoms of cardiac disease. Given her negative stress test by Dr. Lady Gary in 2017 she is free to proceed without further risk stratification."  In pt chart is also documented "Difficult intubation." However pt denies this and no difficulty was documented during intubation 01/10/16.  Preop labs reviewed, unremarkable.  EKG 08/15/19 (copy on chart, red per Dr. Jarrett Ables note): NSR, poor r wave progression, no change comp to 2017.  Nuclear stress 02/05/16: Negative Lexiscan stress.LV function normal.No evidence of reversible  ischemia.Low risk study.  TTE10/2/17: INTERPRETATION NORMAL LEFT VENTRICULAR SYSTOLIC FUNCTION WITH MILD LVH NORMAL RIGHT VENTRICULAR SYSTOLIC FUNCTION MILD VALVULAR REGURGITATION (See above) NO VALVULAR STENOSIS EF >55% Mitral: MILD MR Tricuspid: MILD TR Closest EF: >55% (Estimated)   Hannah Yu [facility] Short Stay  Center/Anesthesiology Phone [phone] 09/05/2019 9:06 AM

## 2019-09-05 NOTE — Anesthesia Preprocedure Evaluation (Addendum)
Anesthesia Evaluation  Patient identified by MRN, date of birth, ID band Patient awake    Reviewed: Allergy & Precautions, NPO status , Patient's Chart, lab work & pertinent test results  Airway Mallampati: II  TM Distance: >3 FB Neck ROM: Full    Dental  (+) Teeth Intact, Dental Advisory Given   Pulmonary former smoker,    breath sounds clear to auscultation       Cardiovascular hypertension,  Rhythm:Regular Rate:Normal     Neuro/Psych    GI/Hepatic   Endo/Other    Renal/GU      Musculoskeletal   Abdominal (+) + obese,   Peds  Hematology   Anesthesia Other Findings   Reproductive/Obstetrics                            Anesthesia Physical Anesthesia Plan  ASA: III  Anesthesia Plan: MAC and Spinal   Post-op Pain Management:    Induction: Intravenous  PONV Risk Score and Plan: Ondansetron and Dexamethasone  Airway Management Planned: Natural Airway and Simple Face Mask  Additional Equipment:   Intra-op Plan:   Post-operative Plan:   Informed Consent: I have reviewed the patients History and Physical, chart, labs and discussed the procedure including the risks, benefits and alternatives for the proposed anesthesia with the patient or authorized representative who has indicated his/her understanding and acceptance.     Dental advisory given  Plan Discussed with: CRNA and Anesthesiologist  Anesthesia Plan Comments: (Pt was scheduled for cholecystectomy 01/10/16 but procedure was aborted due to pt becoming tachycardic after induction (Hr in the 130s despite esmolol). Pt was seen by cardiology in PACU and recommendation was for better preop control with metoprolol. Pt subsequently followed up with cardiologist Dr. Ubaldo Glassing at [facility] and underwent additional testing.  Functional study showed normal LV function with no ischemia. Echocardiogram showed normal LV function with mild MR and mild TR.  His recommendation was to continue metoprolol and she was cleared as low risk for future surgery. The patient states that she was advised to double her AM dose of metoprolol for any future surgeries and plans to do that for this one.   Pt was seen and cleared by PCP Dr. Ola Spurr 08/15/19. Per note, "Preop evaluation-she is being scheduled for hip replacement in May. We reviewed her prior stress test done in 2017. At that time she had an EKG showing possible poor R wave progression. There is no change in her EKG today. She has no symptoms of cardiac disease. Given her negative stress test by Dr. Ubaldo Glassing in 2017 she is free to proceed without further risk stratification."  In pt chart is also documented "Difficult intubation." However pt denies this and no difficulty was documented during intubation 01/10/16.  Preop labs reviewed, unremarkable.  EKG 08/15/19 (copy on chart, read per Dr. Blane Ohara note): NSR, poor r wave progression, no change comp to 2017.  Nuclear stress 02/05/16: Negative Lexiscan stress.LV function normal.No evidence of reversible  ischemia.Low risk study.  TTE10/2/17: INTERPRETATION NORMAL LEFT VENTRICULAR SYSTOLIC FUNCTION WITH MILD LVH NORMAL RIGHT VENTRICULAR SYSTOLIC FUNCTION MILD VALVULAR REGURGITATION (See above) NO VALVULAR STENOSIS EF >55% Mitral: MILD MR Tricuspid: MILD TR Closest EF: >55% (Estimated) )      Anesthesia Quick Evaluation

## 2019-09-06 ENCOUNTER — Encounter (HOSPITAL_COMMUNITY)
Admission: RE | Disposition: A | Discharge status: Home Health | Payer: Self-pay | Source: Home / Self Care | Attending: Orthopaedic Surgery

## 2019-09-06 ENCOUNTER — Inpatient Hospital Stay (HOSPITAL_COMMUNITY)
Admission: RE | Admit: 2019-09-06 | Discharge: 2019-09-11 | DRG: 469 | Disposition: A | Discharge status: Home Health | Payer: Medicare Other | Attending: Orthopaedic Surgery | Admitting: Orthopaedic Surgery

## 2019-09-06 ENCOUNTER — Ambulatory Visit (HOSPITAL_COMMUNITY): Payer: Medicare Other

## 2019-09-06 ENCOUNTER — Encounter (HOSPITAL_COMMUNITY): Payer: Self-pay | Admitting: Orthopaedic Surgery

## 2019-09-06 ENCOUNTER — Ambulatory Visit (HOSPITAL_COMMUNITY): Payer: Medicare Other | Admitting: Physician Assistant

## 2019-09-06 ENCOUNTER — Ambulatory Visit (HOSPITAL_COMMUNITY): Payer: Medicare Other | Admitting: Anesthesiology

## 2019-09-06 ENCOUNTER — Other Ambulatory Visit: Payer: Self-pay

## 2019-09-06 DIAGNOSIS — K219 Gastro-esophageal reflux disease without esophagitis: Secondary | ICD-10-CM | POA: Diagnosis present

## 2019-09-06 DIAGNOSIS — M1611 Unilateral primary osteoarthritis, right hip: Secondary | ICD-10-CM | POA: Diagnosis not present

## 2019-09-06 DIAGNOSIS — R0902 Hypoxemia: Secondary | ICD-10-CM

## 2019-09-06 DIAGNOSIS — E785 Hyperlipidemia, unspecified: Secondary | ICD-10-CM | POA: Diagnosis present

## 2019-09-06 DIAGNOSIS — I10 Essential (primary) hypertension: Secondary | ICD-10-CM | POA: Diagnosis present

## 2019-09-06 DIAGNOSIS — E876 Hypokalemia: Secondary | ICD-10-CM | POA: Diagnosis not present

## 2019-09-06 DIAGNOSIS — Z803 Family history of malignant neoplasm of breast: Secondary | ICD-10-CM

## 2019-09-06 DIAGNOSIS — D62 Acute posthemorrhagic anemia: Secondary | ICD-10-CM | POA: Diagnosis not present

## 2019-09-06 DIAGNOSIS — Z888 Allergy status to other drugs, medicaments and biological substances status: Secondary | ICD-10-CM

## 2019-09-06 DIAGNOSIS — J9601 Acute respiratory failure with hypoxia: Secondary | ICD-10-CM | POA: Diagnosis not present

## 2019-09-06 DIAGNOSIS — E538 Deficiency of other specified B group vitamins: Secondary | ICD-10-CM | POA: Diagnosis present

## 2019-09-06 DIAGNOSIS — Z419 Encounter for procedure for purposes other than remedying health state, unspecified: Secondary | ICD-10-CM

## 2019-09-06 DIAGNOSIS — Z885 Allergy status to narcotic agent status: Secondary | ICD-10-CM

## 2019-09-06 DIAGNOSIS — Z96641 Presence of right artificial hip joint: Secondary | ICD-10-CM

## 2019-09-06 DIAGNOSIS — Z87891 Personal history of nicotine dependence: Secondary | ICD-10-CM

## 2019-09-06 HISTORY — PX: TOTAL HIP ARTHROPLASTY: SHX124

## 2019-09-06 SURGERY — ARTHROPLASTY, HIP, TOTAL, ANTERIOR APPROACH
Anesthesia: Monitor Anesthesia Care | Site: Hip | Laterality: Right

## 2019-09-06 MED ORDER — TRANEXAMIC ACID-NACL 1000-0.7 MG/100ML-% IV SOLN
INTRAVENOUS | Status: AC
  Filled 2019-09-06: qty 100

## 2019-09-06 MED ORDER — METOCLOPRAMIDE HCL 5 MG PO TABS: 5.0000 mg | ORAL_TABLET | Freq: Three times a day (TID) | ORAL | Status: DC | PRN

## 2019-09-06 MED ORDER — PHENOL 1.4 % MT LIQD: 1.0000 | OROMUCOSAL | Status: DC | PRN

## 2019-09-06 MED ORDER — ACETAMINOPHEN 325 MG PO TABS
325.0000 mg | ORAL_TABLET | Freq: Four times a day (QID) | ORAL | Status: DC | PRN
  Administered 2019-09-07: 650 mg via ORAL
  Filled 2019-09-06 (×2): qty 2

## 2019-09-06 MED ORDER — MENTHOL 3 MG MT LOZG: 1.0000 | LOZENGE | OROMUCOSAL | Status: DC | PRN

## 2019-09-06 MED ORDER — LOSARTAN POTASSIUM 50 MG PO TABS
100.0000 mg | ORAL_TABLET | Freq: Every day | ORAL | Status: DC
  Administered 2019-09-06 – 2019-09-11 (×6): 100 mg via ORAL
  Filled 2019-09-06 (×6): qty 2

## 2019-09-06 MED ORDER — FENTANYL CITRATE (PF) 100 MCG/2ML IJ SOLN: 25.0000 ug | INTRAMUSCULAR | Status: DC | PRN

## 2019-09-06 MED ORDER — FUROSEMIDE 40 MG PO TABS
40.0000 mg | ORAL_TABLET | Freq: Every day | ORAL | Status: DC
  Administered 2019-09-06 – 2019-09-11 (×6): 40 mg via ORAL
  Filled 2019-09-06 (×6): qty 1

## 2019-09-06 MED ORDER — ONDANSETRON HCL 4 MG/2ML IJ SOLN
INTRAMUSCULAR | Status: DC | PRN
  Administered 2019-09-06: 4 mg via INTRAVENOUS

## 2019-09-06 MED ORDER — PROPOFOL 10 MG/ML IV BOLUS
INTRAVENOUS | Status: DC | PRN
  Administered 2019-09-06 (×2): 20 mg via INTRAVENOUS
  Administered 2019-09-06: 30 mg via INTRAVENOUS
  Administered 2019-09-06: 20 mg via INTRAVENOUS

## 2019-09-06 MED ORDER — LACTATED RINGERS IV SOLN: INTRAVENOUS | Status: DC

## 2019-09-06 MED ORDER — ONDANSETRON HCL 4 MG/2ML IJ SOLN: 4.0000 mg | Freq: Four times a day (QID) | INTRAMUSCULAR | Status: DC | PRN

## 2019-09-06 MED ORDER — ONDANSETRON HCL 4 MG/2ML IJ SOLN
INTRAMUSCULAR | Status: AC
  Filled 2019-09-06: qty 2

## 2019-09-06 MED ORDER — METOPROLOL SUCCINATE ER 50 MG PO TB24
50.0000 mg | ORAL_TABLET | Freq: Every day | ORAL | Status: DC
  Administered 2019-09-07 – 2019-09-11 (×5): 50 mg via ORAL
  Filled 2019-09-06 (×5): qty 1

## 2019-09-06 MED ORDER — CEFAZOLIN SODIUM-DEXTROSE 1-4 GM/50ML-% IV SOLN
1.0000 g | Freq: Four times a day (QID) | INTRAVENOUS | Status: AC
  Administered 2019-09-06 – 2019-09-07 (×2): 1 g via INTRAVENOUS
  Filled 2019-09-06 (×2): qty 50

## 2019-09-06 MED ORDER — SODIUM CHLORIDE 0.9 % IR SOLN
Status: DC | PRN
  Administered 2019-09-06: 3000 mL

## 2019-09-06 MED ORDER — DIPHENHYDRAMINE HCL 12.5 MG/5ML PO ELIX: 12.5000 mg | ORAL_SOLUTION | ORAL | Status: DC | PRN

## 2019-09-06 MED ORDER — ONDANSETRON HCL 4 MG/2ML IJ SOLN: 4.0000 mg | Freq: Once | INTRAMUSCULAR | Status: DC | PRN

## 2019-09-06 MED ORDER — 0.9 % SODIUM CHLORIDE (POUR BTL) OPTIME
TOPICAL | Status: DC | PRN
  Administered 2019-09-06: 1000 mL

## 2019-09-06 MED ORDER — METOCLOPRAMIDE HCL 5 MG/ML IJ SOLN: 5.0000 mg | Freq: Three times a day (TID) | INTRAMUSCULAR | Status: DC | PRN

## 2019-09-06 MED ORDER — PHENYLEPHRINE 40 MCG/ML (10ML) SYRINGE FOR IV PUSH (FOR BLOOD PRESSURE SUPPORT)
PREFILLED_SYRINGE | INTRAVENOUS | Status: AC
  Filled 2019-09-06: qty 10

## 2019-09-06 MED ORDER — GABAPENTIN 100 MG PO CAPS
100.0000 mg | ORAL_CAPSULE | Freq: Three times a day (TID) | ORAL | Status: DC
  Administered 2019-09-06 – 2019-09-11 (×16): 100 mg via ORAL
  Filled 2019-09-06 (×16): qty 1

## 2019-09-06 MED ORDER — LIDOCAINE 2% (20 MG/ML) 5 ML SYRINGE
INTRAMUSCULAR | Status: DC | PRN
  Administered 2019-09-06: 50 mg via INTRAVENOUS

## 2019-09-06 MED ORDER — CHLORHEXIDINE GLUCONATE CLOTH 2 % EX PADS
6.0000 | MEDICATED_PAD | Freq: Every day | CUTANEOUS | Status: AC
  Administered 2019-09-07 – 2019-09-10 (×5): 6 via TOPICAL

## 2019-09-06 MED ORDER — PHENYLEPHRINE 40 MCG/ML (10ML) SYRINGE FOR IV PUSH (FOR BLOOD PRESSURE SUPPORT)
PREFILLED_SYRINGE | INTRAVENOUS | Status: DC | PRN
  Administered 2019-09-06 (×3): 80 ug via INTRAVENOUS

## 2019-09-06 MED ORDER — DOCUSATE SODIUM 100 MG PO CAPS
100.0000 mg | ORAL_CAPSULE | Freq: Two times a day (BID) | ORAL | Status: DC
  Administered 2019-09-06 – 2019-09-11 (×10): 100 mg via ORAL
  Filled 2019-09-06 (×10): qty 1

## 2019-09-06 MED ORDER — PHENYLEPHRINE HCL-NACL 10-0.9 MG/250ML-% IV SOLN
INTRAVENOUS | Status: DC | PRN
  Administered 2019-09-06: 40 ug/min via INTRAVENOUS

## 2019-09-06 MED ORDER — LIDOCAINE 2% (20 MG/ML) 5 ML SYRINGE
INTRAMUSCULAR | Status: AC
  Filled 2019-09-06: qty 5

## 2019-09-06 MED ORDER — FENTANYL CITRATE (PF) 100 MCG/2ML IJ SOLN
INTRAMUSCULAR | Status: DC | PRN
  Administered 2019-09-06 (×2): 50 ug via INTRAVENOUS

## 2019-09-06 MED ORDER — ONDANSETRON HCL 4 MG PO TABS: 4.0000 mg | ORAL_TABLET | Freq: Four times a day (QID) | ORAL | Status: DC | PRN

## 2019-09-06 MED ORDER — FENTANYL CITRATE (PF) 250 MCG/5ML IJ SOLN
INTRAMUSCULAR | Status: AC
  Filled 2019-09-06: qty 5

## 2019-09-06 MED ORDER — PROPOFOL 500 MG/50ML IV EMUL
INTRAVENOUS | Status: DC | PRN
  Administered 2019-09-06: 50 ug/kg/min via INTRAVENOUS

## 2019-09-06 MED ORDER — PANTOPRAZOLE SODIUM 40 MG PO TBEC
40.0000 mg | DELAYED_RELEASE_TABLET | Freq: Every day | ORAL | Status: DC
  Administered 2019-09-06 – 2019-09-11 (×6): 40 mg via ORAL
  Filled 2019-09-06 (×6): qty 1

## 2019-09-06 MED ORDER — CEFAZOLIN SODIUM-DEXTROSE 2-4 GM/100ML-% IV SOLN
2.0000 g | INTRAVENOUS | Status: AC
  Administered 2019-09-06: 2 g via INTRAVENOUS

## 2019-09-06 MED ORDER — POVIDONE-IODINE 10 % EX SWAB
2.0000 "application " | Freq: Once | CUTANEOUS | Status: AC
  Administered 2019-09-06: 2 via TOPICAL

## 2019-09-06 MED ORDER — PROPOFOL 10 MG/ML IV BOLUS
INTRAVENOUS | Status: AC
  Filled 2019-09-06: qty 20

## 2019-09-06 MED ORDER — OXYCODONE HCL 5 MG PO TABS
5.0000 mg | ORAL_TABLET | ORAL | Status: DC | PRN
  Administered 2019-09-06: 5 mg via ORAL
  Administered 2019-09-07 – 2019-09-10 (×8): 10 mg via ORAL
  Filled 2019-09-06 (×7): qty 2
  Filled 2019-09-06: qty 1
  Filled 2019-09-06 (×2): qty 2

## 2019-09-06 MED ORDER — SODIUM CHLORIDE 0.9 % IV SOLN: INTRAVENOUS | Status: DC

## 2019-09-06 MED ORDER — ALUM & MAG HYDROXIDE-SIMETH 200-200-20 MG/5ML PO SUSP: 30.0000 mL | ORAL | Status: DC | PRN

## 2019-09-06 MED ORDER — MUPIROCIN 2 % EX OINT
1.0000 "application " | TOPICAL_OINTMENT | Freq: Two times a day (BID) | CUTANEOUS | Status: AC
  Administered 2019-09-07 – 2019-09-11 (×10): 1 via NASAL
  Filled 2019-09-06 (×3): qty 22

## 2019-09-06 MED ORDER — AMLODIPINE BESYLATE 10 MG PO TABS
10.0000 mg | ORAL_TABLET | Freq: Every day | ORAL | Status: DC
  Administered 2019-09-07 – 2019-09-11 (×5): 10 mg via ORAL
  Filled 2019-09-06 (×5): qty 1

## 2019-09-06 MED ORDER — OXYCODONE HCL 5 MG PO TABS
10.0000 mg | ORAL_TABLET | ORAL | Status: DC | PRN
  Administered 2019-09-06 – 2019-09-11 (×5): 10 mg via ORAL
  Filled 2019-09-06 (×4): qty 2

## 2019-09-06 MED ORDER — TRANEXAMIC ACID-NACL 1000-0.7 MG/100ML-% IV SOLN
1000.0000 mg | INTRAVENOUS | Status: AC
  Administered 2019-09-06: 1000 mg via INTRAVENOUS

## 2019-09-06 MED ORDER — CEFAZOLIN SODIUM-DEXTROSE 2-4 GM/100ML-% IV SOLN
INTRAVENOUS | Status: AC
  Filled 2019-09-06: qty 100

## 2019-09-06 MED ORDER — HYDROMORPHONE HCL 1 MG/ML IJ SOLN
0.5000 mg | INTRAMUSCULAR | Status: DC | PRN
  Administered 2019-09-06 – 2019-09-07 (×4): 1 mg via INTRAVENOUS
  Filled 2019-09-06 (×4): qty 1

## 2019-09-06 MED ORDER — LACTATED RINGERS IV SOLN: INTRAVENOUS | Status: DC | PRN

## 2019-09-06 MED ORDER — ASPIRIN 81 MG PO CHEW
81.0000 mg | CHEWABLE_TABLET | Freq: Two times a day (BID) | ORAL | Status: DC
  Administered 2019-09-06 – 2019-09-11 (×10): 81 mg via ORAL
  Filled 2019-09-06 (×10): qty 1

## 2019-09-06 SURGICAL SUPPLY — 55 items
APL SKNCLS STERI-STRIP NONHPOA (GAUZE/BANDAGES/DRESSINGS) ×1
BALL HIP ARTICU EZE 36 8.5 (Hips) IMPLANT
BENZOIN TINCTURE PRP APPL 2/3 (GAUZE/BANDAGES/DRESSINGS) ×3 IMPLANT
BLADE CLIPPER SURG (BLADE) IMPLANT
BLADE SAW SGTL 18X1.27X75 (BLADE) ×2 IMPLANT
BLADE SAW SGTL 18X1.27X75MM (BLADE) ×1
CLOSURE WOUND 1/2 X4 (GAUZE/BANDAGES/DRESSINGS) ×2
COVER SURGICAL LIGHT HANDLE (MISCELLANEOUS) ×3 IMPLANT
DRAPE C-ARM 42X72 X-RAY (DRAPES) ×3 IMPLANT
DRAPE STERI IOBAN 125X83 (DRAPES) ×3 IMPLANT
DRAPE U-SHAPE 47X51 STRL (DRAPES) ×9 IMPLANT
DRSG AQUACEL AG ADV 3.5X10 (GAUZE/BANDAGES/DRESSINGS) ×3 IMPLANT
DURAPREP 26ML APPLICATOR (WOUND CARE) ×3 IMPLANT
ELECT BLADE 4.0 EZ CLEAN MEGAD (MISCELLANEOUS) ×3
ELECT BLADE 6.5 EXT (BLADE) IMPLANT
ELECT REM PT RETURN 9FT ADLT (ELECTROSURGICAL) ×3
ELECTRODE BLDE 4.0 EZ CLN MEGD (MISCELLANEOUS) ×1 IMPLANT
ELECTRODE REM PT RTRN 9FT ADLT (ELECTROSURGICAL) ×1 IMPLANT
FACESHIELD WRAPAROUND (MASK) ×9 IMPLANT
FACESHIELD WRAPAROUND OR TEAM (MASK) ×2 IMPLANT
GLOVE BIOGEL PI IND STRL 8 (GLOVE) ×2 IMPLANT
GLOVE BIOGEL PI INDICATOR 8 (GLOVE) ×8
GLOVE ECLIPSE 8.0 STRL XLNG CF (GLOVE) ×3 IMPLANT
GLOVE ORTHO TXT STRL SZ7.5 (GLOVE) ×6 IMPLANT
GLOVE SURG SYN 8.0 (GLOVE) ×3 IMPLANT
GLOVE SURG SYN 8.0 PF PI (GLOVE) IMPLANT
GOWN STRL REUS W/ TWL LRG LVL3 (GOWN DISPOSABLE) ×2 IMPLANT
GOWN STRL REUS W/ TWL XL LVL3 (GOWN DISPOSABLE) ×2 IMPLANT
GOWN STRL REUS W/TWL LRG LVL3 (GOWN DISPOSABLE) ×6
GOWN STRL REUS W/TWL XL LVL3 (GOWN DISPOSABLE) ×6
HANDPIECE INTERPULSE COAX TIP (DISPOSABLE) ×3
HIP BALL ARTICU EZE 36 8.5 (Hips) ×3 IMPLANT
KIT BASIN OR (CUSTOM PROCEDURE TRAY) ×3 IMPLANT
KIT TURNOVER KIT B (KITS) ×3 IMPLANT
LINER NEUTRAL 52X36MM PLUS 4 (Liner) ×2 IMPLANT
MANIFOLD NEPTUNE II (INSTRUMENTS) ×3 IMPLANT
NS IRRIG 1000ML POUR BTL (IV SOLUTION) ×3 IMPLANT
PACK TOTAL JOINT (CUSTOM PROCEDURE TRAY) ×3 IMPLANT
PAD ARMBOARD 7.5X6 YLW CONV (MISCELLANEOUS) ×3 IMPLANT
PIN SECTOR W/GRIP ACE CUP 52MM (Hips) ×2 IMPLANT
SET HNDPC FAN SPRY TIP SCT (DISPOSABLE) ×1 IMPLANT
STEM CORAIL KA11 (Stem) ×2 IMPLANT
STRIP CLOSURE SKIN 1/2X4 (GAUZE/BANDAGES/DRESSINGS) ×4 IMPLANT
SUT ETHIBOND NAB CT1 #1 30IN (SUTURE) ×3 IMPLANT
SUT MNCRL AB 4-0 PS2 18 (SUTURE) IMPLANT
SUT VIC AB 0 CT1 27 (SUTURE) ×3
SUT VIC AB 0 CT1 27XBRD ANBCTR (SUTURE) ×1 IMPLANT
SUT VIC AB 1 CT1 27 (SUTURE) ×3
SUT VIC AB 1 CT1 27XBRD ANBCTR (SUTURE) ×1 IMPLANT
SUT VIC AB 2-0 CT1 27 (SUTURE) ×3
SUT VIC AB 2-0 CT1 TAPERPNT 27 (SUTURE) ×1 IMPLANT
TOWEL GREEN STERILE (TOWEL DISPOSABLE) ×3 IMPLANT
TOWEL GREEN STERILE FF (TOWEL DISPOSABLE) ×3 IMPLANT
TRAY FOLEY MTR SLVR 14FR STAT (SET/KITS/TRAYS/PACK) ×2 IMPLANT
WATER STERILE IRR 1000ML POUR (IV SOLUTION) ×6 IMPLANT

## 2019-09-06 NOTE — Transfer of Care (Signed)
Immediate Anesthesia Transfer of Care Note  Patient: Hannah Yu  Procedure(s) Performed: RIGHT TOTAL HIP ARTHROPLASTY ANTERIOR APPROACH (Right Hip)  Patient Location: PACU  Anesthesia Type:Spinal  Level of Consciousness: awake and patient cooperative  Airway & Oxygen Therapy: Patient Spontanous Breathing and Patient connected to face mask oxygen  Post-op Assessment: Report given to RN and Post -op Vital signs reviewed and stable  Post vital signs: Reviewed and stable  Last Vitals:  Vitals Value Taken Time  BP    Temp    Pulse    Resp    SpO2      Last Pain:  Vitals:   09/06/19 1033  TempSrc:   PainSc: 0-No pain      Patients Stated Pain Goal: 3 (09/06/19 1033)  Complications: No apparent anesthesia complications

## 2019-09-06 NOTE — Evaluation (Signed)
Physical Therapy Evaluation Patient Details Name: Hannah Yu MRN: [id number] DOB: [birth year] Today's Date: 09/06/2019   History of Present Illness  Pt is a 74 y/o female s/p R THA, direct anterior. PMH includes HTN and sciatica.   Clinical Impression  Pt is s/p surgery above with deficits below. Session limited to EOB as pt with effects of nerve block and little quad contraction noted in RLE. Required min A for bed mobility. Reviewed HEP with pt. Will continue to follow acutely to maximize functional mobility independence and safety.      Follow Up Recommendations Follow surgeon's recommendation for DC plan and follow-up therapies;Supervision for mobility/OOB    Equipment Recommendations  Rolling walker with 5" wheels;3in1 (PT)    Recommendations for Other Services       Precautions / Restrictions Precautions Precautions: None Restrictions Weight Bearing Restrictions: Yes RLE Weight Bearing: Weight bearing as tolerated      Mobility  Bed Mobility Overal bed mobility: Needs Assistance Bed Mobility: Supine to Sit;Sit to Supine     Supine to sit: Min assist Sit to supine: Min assist   General bed mobility comments: Assist required for RLE as pt with nerve block effects present. Unsafe to attempt further mobility given current nerve block effects, so further mobility deferred.   Transfers                    Ambulation/Gait                Stairs            Wheelchair Mobility    Modified Rankin (Stroke Patients Only)       Balance Overall balance assessment: Needs assistance Sitting-balance support: No upper extremity supported;Feet supported Sitting balance-Leahy Scale: Good                                       Pertinent Vitals/Pain Pain Assessment: Faces Faces Pain Scale: Hurts little more Pain Location: R hip  Pain Descriptors / Indicators: Operative site guarding Pain Intervention(s): Limited activity within  patient's tolerance;Monitored during session;Repositioned    Home Living Family/patient expects to be discharged to:: Private residence Living Arrangements: Alone Available Help at Discharge: Family;Available 24 hours/day Type of Home: House Home Access: Stairs to enter Entrance Stairs-Rails: Right;Left;Can reach both Entrance Stairs-Number of Steps: 7 Home Layout: Two level Home Equipment: Cane - single point;Shower seat Additional Comments: Reports brother is going to come stay with pt.     Prior Function Level of Independence: Independent with assistive device(s)         Comments: Was using cane for ambulation      Hand Dominance        Extremity/Trunk Assessment   Upper Extremity Assessment Upper Extremity Assessment: Overall WFL for tasks assessed    Lower Extremity Assessment Lower Extremity Assessment: RLE deficits/detail RLE Deficits / Details: Pt with numbness in RLE secondary to block. Quads at grossly 1-2-/5. Deficits consistent with post op pain and weakness. Able to perform ankle pump.  RLE Sensation: decreased light touch    Cervical / Trunk Assessment Cervical / Trunk Assessment: Normal  Communication   Communication: No difficulties  Cognition Arousal/Alertness: Awake/alert Behavior During Therapy: WFL for tasks assessed/performed Overall Cognitive Status: Within Functional Limits for tasks assessed  General Comments      Exercises Total Joint Exercises Ankle Circles/Pumps: AROM;Both;10 reps Quad Sets: AROM;Right;5 reps;Limitations Quad Sets Limitations: partial contraction as nerve block still in place   Assessment/Plan    PT Assessment Patient needs continued PT services  PT Problem List Decreased strength;Decreased range of motion;Decreased activity tolerance;Decreased balance;Decreased mobility;Decreased knowledge of use of DME;Pain       PT Treatment Interventions DME  instruction;Gait training;Stair training;Functional mobility training;Therapeutic activities;Therapeutic exercise;Balance training;Patient/family education    PT Goals (Current goals can be found in the Care Plan section)  Acute Rehab PT Goals Patient Stated Goal: to go home PT Goal Formulation: With patient Time For Goal Achievement: 09/20/19 Potential to Achieve Goals: Good    Frequency 7X/week   Barriers to discharge        Co-evaluation               AM-PAC PT "6 Clicks" Mobility  Outcome Measure Help needed turning from your back to your side while in a flat bed without using bedrails?: A Little Help needed moving from lying on your back to sitting on the side of a flat bed without using bedrails?: A Little Help needed moving to and from a bed to a chair (including a wheelchair)?: A Lot Help needed standing up from a chair using your arms (e.g., wheelchair or bedside chair)?: A Lot Help needed to walk in hospital room?: A Lot Help needed climbing 3-5 steps with a railing? : Total 6 Click Score: 13    End of Session   Activity Tolerance: Patient tolerated treatment well Patient left: in bed;with call bell/phone within reach Nurse Communication: Mobility status PT Visit Diagnosis: Other abnormalities of gait and mobility (R26.89);Pain Pain - Right/Left: Right Pain - part of body: Hip    Time: 1720-1735 PT Time Calculation (min) (ACUTE ONLY): 15 min   Charges:   PT Evaluation $PT Eval Low Complexity: 1 Low          Cindee Salt, DPT  Acute Rehabilitation Services  Pager: [phone] Office: [phone]   Lehman Prom 09/06/2019, 6:12 PM

## 2019-09-06 NOTE — Anesthesia Procedure Notes (Signed)
Spinal  Patient location during procedure: OR Start time: 09/06/2019 1:00 PM End time: 09/06/2019 1:10 PM Staffing Performed: anesthesiologist  Anesthesiologist: Kipp Brood, MD Preanesthetic Checklist Completed: patient identified, IV checked, site marked, risks and benefits discussed, surgical consent, monitors and equipment checked, pre-op evaluation and timeout performed Spinal Block Patient position: sitting Prep: DuraPrep Patient monitoring: heart rate, cardiac monitor, continuous pulse ox and blood pressure Approach: right paramedian Location: L3-4 Injection technique: single-shot Needle Needle type: Sprotte and Tuohy  Needle gauge: 22 G Needle length: 9 cm Assessment Sensory level: T6 Additional Notes 1.6 cc 0.75% Bupivacaine injected easily

## 2019-09-06 NOTE — Anesthesia Procedure Notes (Signed)
Procedure Name: MAC Date/Time: 09/06/2019 1:34 PM Performed by: Orlie Dakin, CRNA Pre-anesthesia Checklist: Patient identified, Emergency Drugs available, Suction available and Patient being monitored Patient Re-evaluated:Patient Re-evaluated prior to induction Oxygen Delivery Method: Simple face mask Preoxygenation: Pre-oxygenation with 100% oxygen Induction Type: IV induction Placement Confirmation: positive ETCO2

## 2019-09-06 NOTE — Op Note (Signed)
NAME: Hannah Yu, Hannah Yu MEDICAL RECORD DJ:[id number] ACCOUNT [id number] DATE OF BIRTH:[birth year] FACILITY: [facility] LOCATION: [facility] PHYSICIAN:Marcio Hoque Aretha Parrot, MD  OPERATIVE REPORT  DATE OF PROCEDURE:  09/06/2019  PREOPERATIVE DIAGNOSIS:  Severe osteoarthritis and degenerative joint disease, right hip.  POSTOPERATIVE DIAGNOSIS:  Severe osteoarthritis and degenerative joint disease, right hip.  PROCEDURE:  Right total hip arthroplasty, direct anterior approach.  IMPLANTS:  DePuy Sector Gription acetabular component size 52, size 36+4 neutral polyethylene liner, size 11 Corail femoral component with standard offset, size 36+8.5 metal hip ball.  SURGEON:  Vanita Panda. Magnus Ivan, MD  ASSISTANT:  Richardean Canal, PA-C.  ANESTHESIA:  Spinal.  ANTIBIOTICS:  Two g IV Ancef.  ESTIMATED BLOOD LOSS:  200 mL.  COMPLICATIONS:  None.  INDICATIONS:  The patient is a 74 year old female with debilitating arthritis involving her right hip.  This has been going on for many years.  Her x-rays showed that the right hip was actually almost subluxing out of the joint.  She has a significant  leg length discrepancy with the right leg operative side shorter than the left.  At this point, given her debilitating pain and the patient that her hip arthritis is causing on her activities of daily living, her mobility, and quality of life, she does  wish to proceed with a total hip arthroplasty.  We did have a long and thorough discussion about this going over x-rays.  We talked about the intraoperative and postoperative course.  I discussed the risk of acute blood loss anemia, nerve or vessel  injury, fracture, infection, dislocation, DVT and implant failure.  We talked about our goals being decreased pain, improved mobility and overall improved quality of life.  DESCRIPTION OF PROCEDURE:  After informed consent was obtained, the appropriate right hip was marked.  She was brought to the operating room and  sat up on a stretcher where spinal anesthesia was then obtained.  She was laid in supine position on a  stretcher.  A Foley catheter was placed.  I was able to get a good assessment of her leg lengths and did find that she is quite shorter on her right operative side than the left.  She was then placed supine on the Hana fracture table, the perineal post  in place and both legs in line skeletal traction boot with no traction applied.  Her right operative hip was prepped and draped with DuraPrep and sterile drapes.  A time-out was called.  She identified as correct patient, correct right hip.  I then made  an incision just inferior and posterior to the anterior iliac spine and carried this obliquely down the leg.  Dissected down to the tensor fascia lata muscle.  Tensor fascia was then divided longitudinally to proceed with direct anterior approach to the  hip.  We identified and cauterized circumflex vessels and identified the hip capsule, opened the hip capsule in an L-type format, finding moderate joint effusion and quite significant deformity of the femoral head and neck from the severity of her  arthritis.  I then made my femoral neck cut with an oscillating saw just proximal to the lesser trochanter and completed this with an osteotome.  We placed a corkscrew guide in the femoral head and removed the femoral head in its entirety and found it to  be significantly flattened and deformed.  I then placed a bent Hohmann over the medial acetabular rim and removed remnants of the acetabular labrum and other debris.  We then basically began reaming in stepwise  increments from a size 44 reamer, going up  to a size 51, with all reamers under direct visualization, the last reamer under direct fluoroscopy so I could take my depth of reaming, my inclination and anteversion.  Basically, we were changing certainly the positioning, almost developing a  pseudoacetabulum from her subluxation.  Once I was pleased with  reaming, I then placed the real DePuy Sector Gription acetabular component size 52 and I went with a 36+4 neutral polyethylene liner given the medialization and the positioning.  Attention  was then turned to the femur.  With the leg externally rotated to 120 degrees, extended and adducted, I was able to place a Mueller retractor medially and a Hohmann retractor above the greater trochanter, released the lateral joint capsule and used a  box-cutting osteotome to enter the femoral canal and a rongeur to lateralize.  I then began broaching using the Corail broaching system from a size 8 going up to a size 11.  The size 11 had a good fill of the femoral canal.  I tried a standard offset  femoral neck and a 36+1.5 hip ball.  We did reduce this easily into the acetabulum and just based on radiographic analysis, we knew we needed a little bit more offset and leg length, but I did not feel like I needed to go with a high offset stem.  I felt  that going up at least 2 ball sizes would give [place] a little offset and tightening as well.  I removed the trial components and placed the real Corail femoral component size 11 with standard offset and we went with a 36+8.5 metal hip ball, again reduced  this in the acetabulum and I was pleased with leg length, offset, range of motion and stability assessed mechanically and radiographically.  We then irrigated the soft tissue with normal saline solution using pulsatile lavage.  We closed the joint  capsule with interrupted #1 Ethibond suture, followed by closing the tensor fascia with #1 Vicryl.  Zero Vicryl was used to close deep tissue, 2-0 Vicryl was used to reapproximate the subcutaneous tissue and we did close the skin with staples.  Xeroform  and Aquacel dressing was applied.  She was taken off the Hana table and taken to the recovery room in stable condition.  All final counts were correct.  There were no complications noted.  Of note, Benita Stabile, PA-C, assisted during the  entire case.  His  assistance was crucial for facilitating all aspects of this case.  VN/NUANCE  D:09/06/2019 T:09/06/2019 JOB:[id number]

## 2019-09-06 NOTE — Progress Notes (Signed)
1550 Received pt from PACU, A&O x4. Right hip incision with Aquacel dressing dry and intact. Denies pain at this time.

## 2019-09-06 NOTE — Anesthesia Postprocedure Evaluation (Signed)
Anesthesia Post Note  Patient: Hannah Yu  Procedure(s) Performed: RIGHT TOTAL HIP ARTHROPLASTY ANTERIOR APPROACH (Right Hip)     Patient location during evaluation: PACU Anesthesia Type: MAC Level of consciousness: oriented and awake and alert Pain management: pain level controlled Vital Signs Assessment: post-procedure vital signs reviewed and stable Respiratory status: spontaneous breathing, respiratory function stable and patient connected to nasal cannula oxygen Cardiovascular status: blood pressure returned to baseline and stable Postop Assessment: no headache, no backache and no apparent nausea or vomiting Anesthetic complications: no    Last Vitals:  Vitals:   09/06/19 1003  BP: (!) 168/70  Pulse: 95  Resp: 18  Temp: (!) 36.4 C  SpO2: 99%    Last Pain:  Vitals:   09/06/19 1033  TempSrc:   PainSc: 0-No pain                 Oleg Oleson COKER

## 2019-09-06 NOTE — Brief Op Note (Signed)
09/06/2019  2:24 PM  PATIENT:  Hannah Yu  74 y.o. female  PRE-OPERATIVE DIAGNOSIS:  endstage osteoarthritis right hip  POST-OPERATIVE DIAGNOSIS:  endstage osteoarthritis right hip  PROCEDURE:  Procedure(s): RIGHT TOTAL HIP ARTHROPLASTY ANTERIOR APPROACH (Right)  SURGEON:  Surgeon(s) and Role:    * Kathryne Hitch, MD - Primary  PHYSICIAN ASSISTANT:  Rexene Edison, PA-C  ANESTHESIA:   spinal  EBL:  200 mL   COUNTS:  YES  DICTATION: .Other Dictation: Dictation Number [id number]  PLAN OF CARE: Admit to inpatient   PATIENT DISPOSITION:  PACU - hemodynamically stable.   Delay start of Pharmacological VTE agent (>24hrs) due to surgical blood loss or risk of bleeding: no

## 2019-09-06 NOTE — H&P (Signed)
TOTAL HIP ADMISSION H&P  Patient is admitted for right total hip arthroplasty.  Subjective:  Chief Complaint: right hip pain  HPI: Hannah Yu, 74 y.o. female, has a history of pain and functional disability in the right hip(s) due to arthritis and patient has failed non-surgical conservative treatments for greater than 12 weeks to include NSAID's and/or analgesics, corticosteriod injections, flexibility and strengthening excercises, supervised PT with diminished ADL's post treatment, use of assistive devices and activity modification.  Onset of symptoms was gradual starting 4 years ago with gradually worsening course since that time.The patient noted no past surgery on the right hip(s).  Patient currently rates pain in the right hip at 10 out of 10 with activity. Patient has night pain, worsening of pain with activity and weight bearing, trendelenberg gait, pain that interfers with activities of daily living and pain with passive range of motion. Patient has evidence of subchondral cysts, subchondral sclerosis, periarticular osteophytes and joint space narrowing by imaging studies. This condition presents safety issues increasing the risk of falls.  There is no current active infection.  Patient Active Problem List   Diagnosis Date Noted  . Pelvic pain in female 07/20/2019  . Hyperlipidemia 07/20/2019  . B12 deficiency 07/20/2019  . Neuropathic pain of left hand 06/23/2017  . Spinal stenosis of lumbosacral region 11/11/2016  . Primary osteoarthritis of right hip 11/11/2016  . Obesity (BMI 30.0-34.9) 09/12/2016  . H/O supraventricular tachycardia 01/14/2016  . Hypertension 12/24/2015  . Back pain with right-sided sciatica 12/24/2015  . Gallstone 10/19/2015  . Biliary colic 10/19/2015  . Diverticulitis 06/28/2014   Past Medical History:  Diagnosis Date  . Acid reflux   . Anesthesia complication    Tachycardia previously, now on metoprolol  . Arthritis    09/02/2019: per patient  "have it real bad in both hands and back"  . Hypertension   . Sciatica    09/02/2019: has had it for about 3 years    Past Surgical History:  Procedure Laterality Date  . ABDOMINAL HYSTERECTOMY    . CATARACT EXTRACTION W/ INTRAOCULAR LENS IMPLANT Bilateral 2007   eyes done within 1 month of each other.  Marland Kitchen SHOULDER ARTHROSCOPY WITH OPEN ROTATOR CUFF REPAIR Right 2012    Current Facility-Administered Medications  Medication Dose Route Frequency Provider Last Rate Last Admin  . ceFAZolin (ANCEF) 2-4 GM/100ML-% IVPB           . ceFAZolin (ANCEF) IVPB 2g/100 mL premix  2 g Intravenous On Call to OR Kirtland Bouchard, PA-C      . lactated ringers infusion   Intravenous Continuous Kipp Brood, MD 10 mL/hr at 09/06/19 1045 New Bag at 09/06/19 1045  . tranexamic acid (CYKLOKAPRON) 1000MG /1101mL IVPB           . tranexamic acid (CYKLOKAPRON) IVPB 1,000 mg  1,000 mg Intravenous To OR 80m, PA-C       Allergies  Allergen Reactions  . Codeine Diarrhea  . Cortisone Swelling  . Lovastatin Other (See Comments)    Muscle cramps    Social History   Tobacco Use  . Smoking status: Former Smoker    Quit date: 01/02/1993    Years since quitting: 26.6  . Smokeless tobacco: Never Used  Substance Use Topics  . Alcohol use: Yes    Alcohol/week: 4.0 standard drinks    Types: 4 Glasses of wine per week    Comment: 09/02/2019: per patient "ever so often"    Family History  Problem  Relation Age of Onset  . Breast cancer Sister 26     Review of Systems  All other systems reviewed and are negative.   Objective:  Physical Exam  Constitutional: She is oriented to person, place, and time. She appears well-developed and well-nourished.  HENT:  Head: Normocephalic and atraumatic.  Eyes: Pupils are equal, round, and reactive to light. EOM are normal.  Cardiovascular: Normal rate.  Respiratory: Effort normal.  GI: Soft.  Musculoskeletal:     Cervical back: Normal range of motion and  neck supple.     Right hip: Tenderness and bony tenderness present. Decreased range of motion. Decreased strength.  Neurological: She is alert and oriented to person, place, and time.  Skin: Skin is warm and dry.  Psychiatric: She has a normal mood and affect.    Vital signs in last 24 hours: Temp:  [97.5 F (36.4 C)] 97.5 F (36.4 C) (05/04 1003) Pulse Rate:  [95] 95 (05/04 1003) Resp:  [18] 18 (05/04 1003) BP: (168)/(70) 168/70 (05/04 1003) SpO2:  [99 %] 99 % (05/04 1003) Weight:  [78.5 kg] 78.5 kg (05/04 1003)  Labs:   Estimated body mass index is 32.69 kg/m as calculated from the following:   Height as of this encounter: 5\' 1"  (1.549 m).   Weight as of this encounter: 78.5 kg.   Imaging Review Plain radiographs demonstrate severe degenerative joint disease of the right hip(s). The bone quality appears to be good for age and reported activity level.      Assessment/Plan:  End stage arthritis, right hip(s)  The patient history, physical examination, clinical judgement of the provider and imaging studies are consistent with end stage degenerative joint disease of the right hip(s) and total hip arthroplasty is deemed medically necessary. The treatment options including medical management, injection therapy, arthroscopy and arthroplasty were discussed at length. The risks and benefits of total hip arthroplasty were presented and reviewed. The risks due to aseptic loosening, infection, stiffness, dislocation/subluxation,  thromboembolic complications and other imponderables were discussed.  The patient acknowledged the explanation, agreed to proceed with the plan and consent was signed. Patient is being admitted for inpatient treatment for surgery, pain control, PT, OT, prophylactic antibiotics, VTE prophylaxis, progressive ambulation and ADL's and discharge planning.The patient is planning to be discharged home with home health services

## 2019-09-07 DIAGNOSIS — E785 Hyperlipidemia, unspecified: Secondary | ICD-10-CM | POA: Diagnosis present

## 2019-09-07 DIAGNOSIS — R0609 Other forms of dyspnea: Secondary | ICD-10-CM | POA: Diagnosis not present

## 2019-09-07 DIAGNOSIS — E538 Deficiency of other specified B group vitamins: Secondary | ICD-10-CM | POA: Diagnosis present

## 2019-09-07 DIAGNOSIS — E876 Hypokalemia: Secondary | ICD-10-CM | POA: Diagnosis not present

## 2019-09-07 DIAGNOSIS — K219 Gastro-esophageal reflux disease without esophagitis: Secondary | ICD-10-CM | POA: Diagnosis present

## 2019-09-07 DIAGNOSIS — Z803 Family history of malignant neoplasm of breast: Secondary | ICD-10-CM | POA: Diagnosis not present

## 2019-09-07 DIAGNOSIS — D62 Acute posthemorrhagic anemia: Secondary | ICD-10-CM | POA: Diagnosis not present

## 2019-09-07 DIAGNOSIS — M1611 Unilateral primary osteoarthritis, right hip: Secondary | ICD-10-CM | POA: Diagnosis present

## 2019-09-07 DIAGNOSIS — I10 Essential (primary) hypertension: Secondary | ICD-10-CM | POA: Diagnosis present

## 2019-09-07 DIAGNOSIS — Z888 Allergy status to other drugs, medicaments and biological substances status: Secondary | ICD-10-CM | POA: Diagnosis not present

## 2019-09-07 DIAGNOSIS — R0902 Hypoxemia: Secondary | ICD-10-CM | POA: Diagnosis not present

## 2019-09-07 DIAGNOSIS — J9601 Acute respiratory failure with hypoxia: Secondary | ICD-10-CM | POA: Diagnosis not present

## 2019-09-07 DIAGNOSIS — Z87891 Personal history of nicotine dependence: Secondary | ICD-10-CM | POA: Diagnosis not present

## 2019-09-07 DIAGNOSIS — Z885 Allergy status to narcotic agent status: Secondary | ICD-10-CM | POA: Diagnosis not present

## 2019-09-07 LAB — BASIC METABOLIC PANEL
Anion gap: 18 — ABNORMAL HIGH (ref 5–15)
BUN: 14 mg/dL (ref 8–23)
CO2: 20 mmol/L — ABNORMAL LOW (ref 22–32)
Calcium: 8.1 mg/dL — ABNORMAL LOW (ref 8.9–10.3)
Chloride: 100 mmol/L (ref 98–111)
Creatinine, Ser: 0.73 mg/dL (ref 0.44–1.00)
GFR calc Af Amer: >60 mL/min (ref >60)
GFR calc non Af Amer: >60 mL/min (ref >60)
Glucose, Bld: 108 mg/dL — ABNORMAL HIGH (ref 70–99)
Potassium: 3.7 mmol/L (ref 3.5–5.1)
Sodium: 138 mmol/L (ref 135–145)

## 2019-09-07 LAB — CBC
HCT: 45.1 % (ref 36.0–46.0)
Hemoglobin: 13.7 g/dL (ref 12.0–15.0)
MCH: 26.3 pg (ref 26.0–34.0)
MCHC: 30.4 g/dL (ref 30.0–36.0)
MCV: 86.7 fL (ref 80.0–100.0)
Platelets: ADEQUATE 10*3/uL (ref 150–400)
RBC: 5.2 MIL/uL — ABNORMAL HIGH (ref 3.87–5.11)
RDW: 16.7 % — ABNORMAL HIGH (ref 11.5–15.5)
WBC: 10.8 10*3/uL — ABNORMAL HIGH (ref 4.0–10.5)
nRBC: 0 % (ref 0.0–0.2)

## 2019-09-07 NOTE — Progress Notes (Signed)
Patient ID: Hannah Yu, female   DOB: 07-Oct-1945, 74 y.o.   MRN: [id number] The patient's hemoglobin and hematocrit were normal postoperative.  Her white count is only slightly elevated.  She is now afebrile but did have a temperature this morning with tachycardia.  She is barely tachycardic now.  There are no notes from therapy as of yet.  With that being said, I need to change her to an inpatient admission because she would not be safe to go home today.  We will observe her today with planning to hopefully discharge to home with home health therapy tomorrow.

## 2019-09-07 NOTE — Progress Notes (Signed)
Physical Therapy Treatment Patient Details Name: Hannah Yu MRN: [id number] DOB: [birth year] Today's Date: 09/07/2019    History of Present Illness Pt is a 74 y/o female s/p R THA, direct anterior. PMH includes HTN and sciatica.     PT Comments    Pt supine in bed on arrival.  Her HR and temp at baseline are elevated.  She report feeling dizziness and BP obtained in supine and sitting.  130/59 supine & 124/67 in sitting.  Pt did progress to standing with short bout of gt.   Will f/u in pm to progress functional mobility.    Follow Up Recommendations  Follow surgeon's recommendation for DC plan and follow-up therapies;Supervision for mobility/OOB     Equipment Recommendations  Rolling walker with 5" wheels;3in1 (PT)    Recommendations for Other Services       Precautions / Restrictions Precautions Precautions: None Restrictions Weight Bearing Restrictions: Yes RLE Weight Bearing: Weight bearing as tolerated    Mobility  Bed Mobility Overal bed mobility: Needs Assistance Bed Mobility: Supine to Sit     Supine to sit: Min assist     General bed mobility comments: Cues for R LE assistance to edge of bed.  Transfers Overall transfer level: Needs assistance Equipment used: Rolling walker (2 wheeled) Transfers: Sit to/from Stand Sit to Stand: Min assist         General transfer comment: Cues for hand placement to and from seated surface to rise into standing.  Ambulation/Gait Ambulation/Gait assistance: Min assist Gait Distance (Feet): 45 Feet Assistive device: Rolling walker (2 wheeled) Gait Pattern/deviations: Step-to pattern;Trunk flexed;Antalgic;Decreased step length - left;Decreased stance time - right;Decreased dorsiflexion - right         Stairs             Wheelchair Mobility    Modified Rankin (Stroke Patients Only)       Balance Overall balance assessment: Needs assistance   Sitting balance-Leahy Scale: Good       Standing  balance-Leahy Scale: Poor Standing balance comment: heavy reliance on B UE support.                            Cognition Arousal/Alertness: Awake/alert Behavior During Therapy: WFL for tasks assessed/performed Overall Cognitive Status: Within Functional Limits for tasks assessed                                        Exercises Total Joint Exercises Ankle Circles/Pumps: AROM;Both;20 reps;Supine Quad Sets: AROM;Right;10 reps Heel Slides: AAROM;Right;10 reps;Supine Hip ABduction/ADduction: AAROM;Right;10 reps;Supine    General Comments        Pertinent Vitals/Pain Pain Assessment: Faces Faces Pain Scale: Hurts little more Pain Location: R hip  Pain Descriptors / Indicators: Operative site guarding Pain Intervention(s): Monitored during session;Repositioned    Home Living                      Prior Function            PT Goals (current goals can now be found in the care plan section) Acute Rehab PT Goals Patient Stated Goal: to go home Time For Goal Achievement: 09/20/19 Progress towards PT goals: Progressing toward goals    Frequency    7X/week      PT Plan Current plan remains appropriate    Co-evaluation  AM-PAC PT "6 Clicks" Mobility   Outcome Measure  Help needed turning from your back to your side while in a flat bed without using bedrails?: A Little Help needed moving from lying on your back to sitting on the side of a flat bed without using bedrails?: A Little Help needed moving to and from a bed to a chair (including a wheelchair)?: A Lot Help needed standing up from a chair using your arms (e.g., wheelchair or bedside chair)?: A Lot Help needed to walk in hospital room?: A Lot Help needed climbing 3-5 steps with a railing? : Total 6 Click Score: 13    End of Session Equipment Utilized During Treatment: Gait belt Activity Tolerance: Patient tolerated treatment well Patient left: with call  bell/phone within reach;in chair;with chair alarm set Nurse Communication: Mobility status PT Visit Diagnosis: Other abnormalities of gait and mobility (R26.89);Pain Pain - Right/Left: Right Pain - part of body: Hip     Time: 0852-0920 PT Time Calculation (min) (ACUTE ONLY): 28 min  Charges:  $Gait Training: 8-22 mins $Therapeutic Exercise: 8-22 mins                     Erasmo Leventhal , PTA Acute Rehabilitation Services Pager [phone] Office [phone]     Hildreth Robart Eli Hose 09/07/2019, 12:33 PM

## 2019-09-07 NOTE — TOC Initial Note (Signed)
Transition of Care [facility]) - Initial/Assessment Note    Patient Details  Name: Hannah Yu MRN: [id number] Date of Birth: [birth year]  Transition of Care [facility]) CM/SW Contact:    Curlene Labrum, RN Phone Number: 09/07/2019, 2:56 PM  Clinical Narrative:                 Case management met with patient at bedside after S/P Right total hip arthroplasty by Dr. Ninfa Linden.  Patient states that she lives alone but has friends and family to check on her that are listed on the facesheet.  Patient states that she currently does not have any dme equipment.  Patient given choice regarding home health services by Dr. Trevor Mace office and patient already set up with [facility] for [facility] health [facility].  Patient given choice regarding dme company and patient did not have a preference.  [employer] called and rolling walker and 3:1 ordered to be delivered by tomorrow for probable discharge.  Expected Discharge Plan: B and E Barriers to Discharge: Continued Medical Work up   Patient Goals and CMS Choice Patient states their goals for this hospitalization and ongoing recovery are:: Patient is planning to go home with home health CMS Medicare.gov Compare Post Acute Care list provided to:: Patient Choice offered to / list presented to : Patient  Expected Discharge Plan and Services Expected Discharge Plan: [place]   Discharge Planning Services: CM Consult Post Acute Care Choice: Durable Medical Equipment Living arrangements for the past 2 months: Single Family Home                 DME Arranged: 3-N-1, Walker rolling DME Agency: [employer] Date DME Agency Contacted: 09/07/19 Time DME Agency Contacted: [phone] Representative spoke with at DME Agency: [place]: PT [place]: [facility]formerly [employer])        Prior Living Arrangements/Services Living arrangements for the past 2 months: [place] with::  Self Patient language and need for interpreter reviewed:: Yes Do you feel safe going back to the place where you live?: Yes      Need for Family Participation in Patient Care: Yes (Comment) Care giver support system in place?: Yes (comment)   Criminal Activity/Legal Involvement Pertinent to Current Situation/Hospitalization: No - Comment as needed  Activities of Daily Living Home Assistive Devices/Equipment: Cane (specify quad or straight), Walker (specify type) ADL Screening (condition at time of admission) Patient's cognitive ability adequate to safely complete daily activities?: Yes Is the patient deaf or have difficulty hearing?: No Does the patient have difficulty seeing, even when wearing glasses/contacts?: No Does the patient have difficulty concentrating, remembering, or making decisions?: No Patient able to express need for assistance with ADLs?: Yes Does the patient have difficulty dressing or bathing?: No Independently performs ADLs?: Yes (appropriate for developmental age) Does the patient have difficulty walking or climbing stairs?: Yes Weakness of Legs: Right Weakness of Arms/Hands: None  Permission Sought/Granted Permission sought to share information with : Case Manager Permission granted to share information with : Yes, Verbal Permission Granted     Permission granted to share info w AGENCY: [facility]        Emotional Assessment Appearance:: Appears stated age Attitude/Demeanor/Rapport: Gracious Affect (typically observed): Accepting Orientation: : Oriented to Self, Oriented to Place, Oriented to  Time, Oriented to Situation Alcohol / Substance Use: Not Applicable Psych Involvement: No (comment)  Admission diagnosis:  Status post total replacement of  right hip [Z96.641] Patient Active Problem List   Diagnosis Date Noted  . 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 Pelvic pain in female 07/20/2019  . Hyperlipidemia 07/20/2019  . B12  deficiency 07/20/2019  . Neuropathic pain of left hand 06/23/2017  . Spinal stenosis of lumbosacral region 11/11/2016  . Primary osteoarthritis of right hip 11/11/2016  . Obesity (BMI 30.0-34.9) 09/12/2016  . H/O supraventricular tachycardia 01/14/2016  . Hypertension 12/24/2015  . Back pain with right-sided sciatica 12/24/2015  . Gallstone 10/19/2015  . Biliary colic 19/80/2217  . Diverticulitis 06/28/2014   PCP:  [place] Pink, MD Pharmacy:   [place], [place] - [address] [place] [address] Phone: [phone] Fax: [phone]     Social Determinants of Health (SDOH) Interventions    Readmission Risk Interventions Readmission Risk Prevention Plan 09/07/2019  Post Dischage Appt Complete  Medication Screening Complete  Transportation Screening Complete  Some recent data might be hidden

## 2019-09-07 NOTE — Progress Notes (Signed)
Subjective: 1 Day Post-Op Procedure(s) (LRB): RIGHT TOTAL HIP ARTHROPLASTY ANTERIOR APPROACH (Right) Patient reports pain as moderate.  Has not been up with therapy and CBC is not back yet.  Objective: Vital signs in last 24 hours: Temp:  [97 F (36.1 C)-98.3 F (36.8 C)] 98.3 F (36.8 C) (05/05 0346) Pulse Rate:  [62-95] 82 (05/05 0346) Resp:  [10-18] 16 (05/05 0346) BP: (108-168)/(55-99) 115/57 (05/05 0346) SpO2:  [93 %-100 %] 98 % (05/05 0346) Weight:  [78.5 kg] 78.5 kg (05/04 1003)  Intake/Output from previous day: 05/04 0701 - 05/05 0700 In: 1280 [P.O.:30; I.V.:1150; IV Piggyback:100] Out: 2200 [Urine:2000; Blood:200] Intake/Output this shift: No intake/output data recorded.  No results for input(s): HGB in the last 72 hours. No results for input(s): WBC, RBC, HCT, PLT in the last 72 hours. Recent Labs    09/07/19 0606  NA 138  K 3.7  CL 100  CO2 20*  BUN 14  CREATININE 0.73  GLUCOSE 108*  CALCIUM 8.1*   No results for input(s): LABPT, INR in the last 72 hours.  Sensation intact distally Intact pulses distally Dorsiflexion/Plantar flexion intact Dressing clean and dry  Assessment/Plan: 1 Day Post-Op Procedure(s) (LRB): RIGHT TOTAL HIP ARTHROPLASTY ANTERIOR APPROACH (Right) Up with therapy Will most likely need to stay today and discharge tomorrow since going home and needs family support at home.    Kathryne Hitch 09/07/2019, 8:11 AM

## 2019-09-07 NOTE — Progress Notes (Signed)
   09/07/19 0818  Assess: MEWS Score  Temp (!) 101.5 F (38.6 C) (RN notified')  BP (!) 117/55  Pulse Rate (!) 126 (RN notified)  Resp 17  SpO2 95 %  O2 Device Nasal Cannula  O2 Flow Rate (L/min) 2.5 L/min  Assess: MEWS Score  MEWS Temp 2  MEWS Systolic 0  MEWS Pulse 2  MEWS RR 0  MEWS LOC 0  MEWS Score 4  MEWS Score Color Red  Assess: if the MEWS score is Yellow or Red  Were vital signs taken at a resting state? Yes  Focused Assessment Documented focused assessment  Early Detection of Sepsis Score *See Row Information* Low  MEWS guidelines implemented *See Row Information* Yes  Treat  MEWS Interventions Administered prn meds/treatments  Take Vital Signs  Increase Vital Sign Frequency  Red: Q 1hr X 4 then Q 4hr X 4, if remains red, continue Q 4hrs  Escalate  MEWS: Escalate Red: discuss with charge nurse/RN and provider, consider discussing with RRT  Notify: Charge Nurse/RN  Name of Charge Nurse/RN Notified Roj,RN  Date Charge Nurse/RN Notified 09/07/19  Time Charge Nurse/RN Notified 2130  Notify: Provider  Provider Name/Title Rexene Edison PA  Date Provider Notified 09/07/19  Time Provider Notified [phone]  Notification Type Call  Notification Reason Other (Comment)  Response No new orders (reminder to use incentive spirometer)  Date of Provider Response 09/07/19  Time of Provider Response [phone]  Notify: Rapid Response  Name of Rapid Response RN Notified Puja  Date Rapid Response Notified 09/07/19  Time Rapid Response Notified 0839   MD, charge RN, rapid response RN notified, focused assessment done, vital signs taken and recorded, Pt not in distress, denies pain 0/10, now resting comfortably in bed. Will continue to monitor.

## 2019-09-07 NOTE — Progress Notes (Signed)
Physical Therapy Treatment Patient Details Name: Hannah Yu MRN: [id number] DOB: 02/14/1946 Today's Date: 09/07/2019    History of Present Illness Pt is a 74 y/o female s/p R THA, direct anterior. PMH includes HTN and sciatica.     PT Comments    Pt seated in recliner.  Able to progress activity tolerance this pm.  More sore after tx ice applied and RN gave pain meds.  Plan for stair training in am to prepare for return home.     Follow Up Recommendations  Follow surgeon's recommendation for DC plan and follow-up therapies;Supervision for mobility/OOB     Equipment Recommendations  Rolling walker with 5" wheels;3in1 (PT)    Recommendations for Other Services       Precautions / Restrictions Precautions Precautions: None Restrictions Weight Bearing Restrictions: Yes RLE Weight Bearing: Weight bearing as tolerated    Mobility  Bed Mobility Overal bed mobility: Needs Assistance Bed Mobility: Sit to Supine     Supine to sit: Min assist Sit to supine: Min assist   General bed mobility comments: Min assistance to advance and lift RLE back to bed against gravity.  Once in supine able to follow commands for boosting.  Transfers Overall transfer level: Needs assistance Equipment used: Rolling walker (2 wheeled) Transfers: Sit to/from Stand Sit to Stand: Min guard         General transfer comment: Cues for hand placement to and from seated surface to rise into standing.  Pt presents with poor eccentric load.  Ambulation/Gait Ambulation/Gait assistance: Min guard Gait Distance (Feet): 80 Feet Assistive device: Rolling walker (2 wheeled) Gait Pattern/deviations: Trunk flexed;Antalgic;Decreased step length - left;Decreased stance time - right;Decreased dorsiflexion - right;Step-through pattern     General Gait Details: Cues for upper trunk control, gt symmetry and R knee flexion in swing phase.  Pt fatigues quickly.   Stairs             Wheelchair  Mobility    Modified Rankin (Stroke Patients Only)       Balance Overall balance assessment: Needs assistance Sitting-balance support: No upper extremity supported;Feet supported Sitting balance-Leahy Scale: Good       Standing balance-Leahy Scale: Fair Standing balance comment: heavy reliance on B UE support.                            Cognition Arousal/Alertness: Awake/alert Behavior During Therapy: WFL for tasks assessed/performed Overall Cognitive Status: Within Functional Limits for tasks assessed                                        Exercises Total Joint Exercises Ankle Circles/Pumps: AROM;Both;20 reps;Supine Quad Sets: AROM;Right;10 reps;Supine Short Arc Quad: AROM;Right;10 reps;Supine Heel Slides: AAROM;Right;10 reps;Supine Hip ABduction/ADduction: AAROM;Right;10 reps;Supine    General Comments        Pertinent Vitals/Pain Pain Assessment: Faces Faces Pain Scale: Hurts even more Pain Location: R hip  Pain Descriptors / Indicators: Tightness Pain Intervention(s): Monitored during session;Limited activity within patient's tolerance;Ice applied;Repositioned;Patient requesting pain meds-RN notified;RN gave pain meds during session    Home Living                      Prior Function            PT Goals (current goals can now be found in the care plan section) Acute  Rehab PT Goals Patient Stated Goal: to go home Time For Goal Achievement: 09/20/19 Potential to Achieve Goals: Good Progress towards PT goals: Progressing toward goals    Frequency    7X/week      PT Plan Current plan remains appropriate    Co-evaluation              AM-PAC PT "6 Clicks" Mobility   Outcome Measure  Help needed turning from your back to your side while in a flat bed without using bedrails?: A Little Help needed moving from lying on your back to sitting on the side of a flat bed without using bedrails?: A Little Help  needed moving to and from a bed to a chair (including a wheelchair)?: A Little Help needed standing up from a chair using your arms (e.g., wheelchair or bedside chair)?: A Little Help needed to walk in hospital room?: A Little Help needed climbing 3-5 steps with a railing? : A Little 6 Click Score: 18    End of Session Equipment Utilized During Treatment: Gait belt Activity Tolerance: Patient tolerated treatment well Patient left: with call bell/phone within reach;in chair;with chair alarm set Nurse Communication: Mobility status PT Visit Diagnosis: Other abnormalities of gait and mobility (R26.89);Pain Pain - Right/Left: Right Pain - part of body: Hip     Time: 2751-7001 PT Time Calculation (min) (ACUTE ONLY): 29 min  Charges:  $Gait Training: 8-22 mins $Therapeutic Exercise: 8-22 mins                     Erasmo Leventhal , PTA Acute Rehabilitation Services Pager [phone] Office [phone]     Ryu Cerreta Eli Hose 09/07/2019, 1:47 PM

## 2019-09-08 ENCOUNTER — Encounter: Payer: Self-pay | Admitting: *Deleted

## 2019-09-08 ENCOUNTER — Other Ambulatory Visit (HOSPITAL_COMMUNITY): Payer: Medicare Other

## 2019-09-08 ENCOUNTER — Inpatient Hospital Stay (HOSPITAL_COMMUNITY): Payer: Medicare Other

## 2019-09-08 LAB — CBC
HCT: 30.3 % — ABNORMAL LOW (ref 36.0–46.0)
Hemoglobin: 9.4 g/dL — ABNORMAL LOW (ref 12.0–15.0)
MCH: 26.5 pg (ref 26.0–34.0)
MCHC: 31 g/dL (ref 30.0–36.0)
MCV: 85.4 fL (ref 80.0–100.0)
Platelets: 270 10*3/uL (ref 150–400)
RBC: 3.55 MIL/uL — ABNORMAL LOW (ref 3.87–5.11)
RDW: 15.9 % — ABNORMAL HIGH (ref 11.5–15.5)
WBC: 12.8 10*3/uL — ABNORMAL HIGH (ref 4.0–10.5)
nRBC: 0 % (ref 0.0–0.2)

## 2019-09-08 MED ORDER — TECHNETIUM TO 99M ALBUMIN AGGREGATED
1.5900 | Freq: Once | INTRAVENOUS | Status: AC | PRN
  Administered 2019-09-08: 1.59 via INTRAVENOUS

## 2019-09-08 MED ORDER — TECHNETIUM TC 99M DIETHYLENETRIAME-PENTAACETIC ACID
40.6000 | Freq: Once | INTRAVENOUS | Status: AC | PRN
  Administered 2019-09-08: 40.6 via RESPIRATORY_TRACT

## 2019-09-08 NOTE — Progress Notes (Signed)
PT Cancellation Note  Patient Details Name: Hannah Yu MRN: [id number] DOB: 06/11/1945   Cancelled Treatment:    Reason Eval/Treat Not Completed: (P) Medical issues which prohibited therapy(will defer PT this pm and await results of VQ scan to r/o PE.)   Melville Engen Artis Delay 09/08/2019, 1:37 PM  Bonney Leitz , PTA Acute Rehabilitation Services Pager [phone] Office [phone]

## 2019-09-08 NOTE — Progress Notes (Signed)
   09/08/19 1119  Assess: MEWS Score  Temp 98.7 F (37.1 C)  BP 111/78  Pulse Rate (!) 116  Resp 17  Level of Consciousness Alert  SpO2 90 %  O2 Device Nasal Cannula  O2 Flow Rate (L/min) 1 L/min  Assess: MEWS Score  MEWS Temp 0  MEWS Systolic 0  MEWS Pulse 2  MEWS RR 0  MEWS LOC 0  MEWS Score 2  MEWS Score Color Yellow  Assess: if the MEWS score is Yellow or Red  Were vital signs taken at a resting state? Yes  Focused Assessment Documented focused assessment  Early Detection of Sepsis Score *See Row Information* Low  MEWS guidelines implemented *See Row Information* Yes  Treat  MEWS Interventions Escalated (See documentation below)  Take Vital Signs  Increase Vital Sign Frequency  Yellow: Q 2hr X 2 then Q 4hr X 2, if remains yellow, continue Q 4hrs  Escalate  MEWS: Escalate Yellow: discuss with charge nurse/RN and consider discussing with provider and RRT  Notify: Charge Nurse/RN  Name of Charge Nurse/RN Notified Jasmine December Go  Date Charge Nurse/RN Notified 09/08/19  Time Charge Nurse/RN Notified 1119  Notify: Provider  Provider Name/Title Dr. Magnus Ivan  Date Provider Notified 09/08/19  Time Provider Notified 1125  Notification Type Call  Notification Reason Change in status (Dizzy, decreased O2 sats, irregualr pulse and murmur, MEWS)  Response No new orders (MD states that PA will assess pt)  Date of Provider Response 09/08/19  Time of Provider Response 1125  Notify: Rapid Response  Name of Rapid Response RN Notified N/A   Yellow MEWS triggered by increased pulse rate.  Dr. Magnus Ivan contacted to report yellow MEWS, decreased O2 sats on oxygen, irregular pulse with murmur, and patient complaint of dizziness and shakiness.  MD states that PA will assess patient.  Will continue to monitor.

## 2019-09-08 NOTE — Progress Notes (Signed)
Patient returned to room at 1900 on 09/08/19.

## 2019-09-08 NOTE — Progress Notes (Signed)
Physical Therapy Treatment Patient Details Name: Hannah Yu MRN: [id number] DOB: 13-Dec-1945 Today's Date: 09/08/2019    History of Present Illness Pt is a 74 y/o female s/p R THA, direct anterior. PMH includes HTN and sciatica.     PT Comments    Pt supine in bed on arrival this session.  She continues to be limited due to dizziness and low SPO2.  Pt on RA with poor wave form on pulse ox, replaced sensor post session to forehead and she remains 87% on RA, applied 1L [place] and SPO2 91%.  She is progressing well this session functionally but stairs are not safe due to dizziness.  BP 138/62, HR 135.  Will f/u in pm this session for mobility progression.      Follow Up Recommendations  Follow surgeon's recommendation for DC plan and follow-up therapies;Supervision for mobility/OOB     Equipment Recommendations  Rolling walker with 5" wheels;3in1 (PT)    Recommendations for Other Services       Precautions / Restrictions Precautions Precautions: None Restrictions Weight Bearing Restrictions: Yes RLE Weight Bearing: Weight bearing as tolerated    Mobility  Bed Mobility Overal bed mobility: Needs Assistance Bed Mobility: Supine to Sit     Supine to sit: Supervision     General bed mobility comments: Increased time and effort but able to mobilize to edge of bed unassisted.  Increased effort to scoot to edge of bed.  Transfers Overall transfer level: Needs assistance Equipment used: Rolling walker (2 wheeled) Transfers: Sit to/from Stand Sit to Stand: Min guard         General transfer comment: Cues for hand placement to push from seated surface.  Poor hand placement during descent to seated surface.  Ambulation/Gait Ambulation/Gait assistance: Min guard Gait Distance (Feet): 60 Feet Assistive device: Rolling walker (2 wheeled) Gait Pattern/deviations: Trunk flexed;Antalgic;Decreased step length - left;Decreased stance time - right;Decreased dorsiflexion -  right;Step-through pattern     General Gait Details: Pt limited due to dizziness.  She is progressing well functionally.   Stairs             Wheelchair Mobility    Modified Rankin (Stroke Patients Only)       Balance Overall balance assessment: Needs assistance Sitting-balance support: No upper extremity supported;Feet supported Sitting balance-Leahy Scale: Good       Standing balance-Leahy Scale: Fair Standing balance comment: heavy reliance on B UE support.                            Cognition Arousal/Alertness: Awake/alert Behavior During Therapy: WFL for tasks assessed/performed Overall Cognitive Status: Within Functional Limits for tasks assessed                                 General Comments: very dizzy during session with c/o HA.      Exercises Total Joint Exercises Ankle Circles/Pumps: AROM;Both;20 reps;Supine Quad Sets: AROM;Right;10 reps;Supine Short Arc Quad: AROM;Right;10 reps;Supine Heel Slides: AAROM;Right;10 reps;Supine Hip ABduction/ADduction: AAROM;Right;10 reps;Supine    General Comments        Pertinent Vitals/Pain Pain Assessment: Faces Faces Pain Scale: Hurts little more Pain Location: R hip  Pain Descriptors / Indicators: Tightness Pain Intervention(s): Monitored during session;Repositioned    Home Living                      Prior Function  PT Goals (current goals can now be found in the care plan section) Acute Rehab PT Goals Patient Stated Goal: to go home Potential to Achieve Goals: Good Progress towards PT goals: Progressing toward goals    Frequency    7X/week      PT Plan Current plan remains appropriate    Co-evaluation              AM-PAC PT "6 Clicks" Mobility   Outcome Measure  Help needed turning from your back to your side while in a flat bed without using bedrails?: A Little Help needed moving from lying on your back to sitting on the side of  a flat bed without using bedrails?: A Little Help needed moving to and from a bed to a chair (including a wheelchair)?: A Little Help needed standing up from a chair using your arms (e.g., wheelchair or bedside chair)?: A Little Help needed to walk in hospital room?: A Little Help needed climbing 3-5 steps with a railing? : A Little 6 Click Score: 18    End of Session Equipment Utilized During Treatment: Gait belt Activity Tolerance: Patient tolerated treatment well Patient left: with call bell/phone within reach;in chair;with chair alarm set Nurse Communication: Mobility status PT Visit Diagnosis: Other abnormalities of gait and mobility (R26.89);Pain Pain - Right/Left: Right Pain - part of body: Hip     Time: 1019-1050 PT Time Calculation (min) (ACUTE ONLY): 31 min  Charges:  $Gait Training: 8-22 mins $Therapeutic Exercise: 8-22 mins                     Bonney Leitz , PTA Acute Rehabilitation Services Pager [phone] Office [phone]     Yun Gutierrez Artis Delay 09/08/2019, 10:55 AM

## 2019-09-08 NOTE — Progress Notes (Signed)
Patient transported to Nuclear Medicine at 1746 on 09/08/19 via bed by transport.

## 2019-09-08 NOTE — Progress Notes (Signed)
Subjective: 2 Days Post-Op Procedure(s) (LRB): RIGHT TOTAL HIP ARTHROPLASTY ANTERIOR APPROACH (Right) Patient reports pain as non existent while sitting. Denies any chest pain or SOB. States she feels dizzy which was worse with ambulation. Prior history of SVT perioperative setting 2017.   Objective: Vital signs in last 24 hours: Temp:  [98 F (36.7 C)-100.1 F (37.8 C)] 98.7 F (37.1 C) (05/06 1119) Pulse Rate:  [91-116] 116 (05/06 1119) Resp:  [15-20] 17 (05/06 1119) BP: (111-149)/(56-96) 111/78 (05/06 1119) SpO2:  [90 %-95 %] 90 % (05/06 1119)  Intake/Output from previous day: 05/05 0701 - 05/06 0700 In: 1425.9 [I.V.:1425.9] Out: -  Intake/Output this shift: Total I/O In: 240 [P.O.:240] Out: -   Recent Labs    09/07/19 0606  HGB 13.7   Recent Labs    09/07/19 0606  WBC 10.8*  RBC 5.20*  HCT 45.1  PLT PLATELET CLUMPS NOTED ON SMEAR, COUNT APPEARS ADEQUATE   Recent Labs    09/07/19 0606  NA 138  K 3.7  CL 100  CO2 20*  BUN 14  CREATININE 0.73  GLUCOSE 108*  CALCIUM 8.1*   No results for input(s): LABPT, INR in the last 72 hours.  General: Patient in no acute distress.  Mood and affect appropriate. Psych: Alert and oriented x3 Vascular: Irregular pulse Right hip: Surgical incision sites covered with dressing which is clean dry and intact.  Right hip thigh is supple and nontender.  Right calf supple nontender.  Dorsiflexion plantarflexion right ankle intact.   Assessment/Plan: 2 Days Post-Op Procedure(s) (LRB): RIGHT TOTAL HIP ARTHROPLASTY ANTERIOR APPROACH (Right) Will obtain a chest x-ray due to patient's oxygen desaturations.  Also obtain a CBC.  Encourage incentive spirometry. ECG due to irregular pulse.  Medicine consult.        Lorrin Nawrot 09/08/2019, 11:59 AM

## 2019-09-08 NOTE — Plan of Care (Signed)
  Problem: Education: Goal: Knowledge of General Education information will improve Description: Including pain rating scale, medication(s)/side effects and non-pharmacologic comfort measures 09/08/2019 1554 by Reynold Bowen, RN Outcome: Progressing 09/08/2019 1554 by Reynold Bowen, RN Outcome: Progressing   Problem: Education: Goal: Knowledge of the prescribed therapeutic regimen will improve 09/08/2019 1554 by Reynold Bowen, RN Outcome: Progressing 09/08/2019 1554 by Reynold Bowen, RN Outcome: Progressing

## 2019-09-08 NOTE — Consult Note (Signed)
[facility] Medical Consultation  AVACYN KLOOSTERMAN PPJ:[id number] DOB: 10/02/1945 DOA: 09/06/2019 PCP: Maryland Pink, MD   Requesting physician: Dr. Ninfa Linden Date of consultation: Sep 08, 2019 Reason for consultation: Hypoxia  Impression/Recommendations Principal Problem:   Primary osteoarthritis of right hip Active Problems:   Status post total replacement of right hip    1. Acute hypoxic respiratory failure, hypoxia has been going on for last 2 days, with fluctuation of oxygen level from mid 80s to mid 90s with 1 L oxygen on, patient rather asymptomatic, denied any cough, no significant feeling of short of breath, no chest pain.  Given what on x-ray findings showing no significant infiltrates, significant atelectasis or congestion, physical exam did not show any crackles or wheezing, will go ahead rule out PE in this case, patient refused CT, citing she has severe claustrophobia when doing CT in the pastdespite sedation.  Will order VQ scan.  There is also question about whether patient has chronic lung issues, her CT abdomen in the past in 2017 and 2015 both accidentally scan her lower lungs, and reported bilateral lower lungs scaring, with unclear etiology.  If her VQ scan negative, I recommend she go see pulmonologist as outpatient to do a formal lung function test to see how much the scarring affects her lung function.  She also needs a sleep study.  Also claims she had a echocardiogram done 2 days ago, however cannot locate any recent echo report in Care everywhere. The only Echo report was from 2017, which only shows mild mitral regurgitation and normal LVEF and RV function. 2. HTN, fairly controlled, continue Lasix and other BP meds.  Patient reported she has been on Lasix for more than 5 years, mainly for leg swelling, which seems implying there might be right-sided heart issue such as pulmonary HTN. 3. Sinus Tachy, rule out PE and check EKG, continue beta blocker 4. Right hip  osteoarthritis, status post right hip replacement, pain fairly controlled, DVT prophylaxis as per primary team   I will followup again tomorrow. Please contact me if I can be of assistance in the meanwhile. Thank you for this consultation.  Chief Complaint: My oxygen level has been low  HPI:  Patient is 27, with past medical history of hypertension, sinus tachycardia on beta-blocker, OA, came for elective right hip replacement, which was performed 2 days ago.  Floor nurse noticed that the patient oxygen level has been fluctuating since yesterday.  Patient has been on aspirin twice daily for DVT prophylaxis, and she has been using the incentive spirometry every hour since after surgery, and she denied any cough, no short of breath, no leg swelling, and her surgical pain has been fairly controlled.  She further denied any feeling of fever or chills.  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  Review of Systems:  10 points of review of system done negative except those mentioned in HPI  Past Medical History:  Diagnosis Date  . Acid reflux   . Anesthesia complication    Tachycardia previously, now on metoprolol  . Arthritis    09/02/2019: per patient "have it real bad in both hands and back"  . Hypertension   . Sciatica    09/02/2019: has had it for about 3 years   Past Surgical History:  Procedure Laterality Date  . ABDOMINAL HYSTERECTOMY    . CATARACT EXTRACTION W/ INTRAOCULAR LENS IMPLANT Bilateral 2007   eyes  done within 1 month of each other.  Marland Kitchen SHOULDER ARTHROSCOPY WITH OPEN ROTATOR CUFF REPAIR Right 2012  . TOTAL HIP ARTHROPLASTY Right 09/06/2019   Social History:  reports that she quit smoking about 26 years ago. She has never used smokeless tobacco. She reports current alcohol use of about 4.0 standard drinks of alcohol per week. She reports that she does not use drugs.  Allergies  Allergen  Reactions  . Codeine Diarrhea  . Cortisone Swelling  . Lovastatin Other (See Comments)    Muscle cramps   Family History  Problem Relation Age of Onset  . Breast cancer Sister 90    Prior to Admission medications   Medication Sig Start Date End Date Taking? Authorizing Provider  amLODipine (NORVASC) 10 MG tablet Take 10 mg by mouth daily. 07/15/19  Yes [provider]  aspirin 81 MG EC tablet Take 81 mg by mouth daily.    Yes [provider]  Biotin w/ Vitamins C & E (HAIR/SKIN/NAILS PO) Take 1 tablet by mouth daily.   Yes [provider]  calcium carbonate (OS-CAL - DOSED IN MG OF ELEMENTAL CALCIUM) 1250 (500 Ca) MG tablet Take 1 tablet by mouth daily with breakfast.   Yes [provider]  Cyanocobalamin (VITAMIN B 12 PO) Take 5,000 mcg by mouth every morning.   Yes [provider]  cyclobenzaprine (FLEXERIL) 5 MG tablet Take 5 mg by mouth 3 (three) times daily as needed for muscle spasms.   Yes [provider]  Docusate Calcium (STOOL SOFTENER PO) Take 1 tablet by mouth daily.   Yes [provider]  FIBER PO Take 2 tablets by mouth daily.   Yes [provider]  furosemide (LASIX) 40 MG tablet Take 40 mg by mouth daily.    Yes [provider]  ibuprofen (ADVIL) 200 MG tablet Take 400 mg by mouth every 6 (six) hours as needed for mild pain or moderate pain.   Yes [provider]  losartan (COZAAR) 100 MG tablet Take 100 mg by mouth daily. 07/15/19  Yes [provider]  metoprolol succinate (TOPROL-XL) 50 MG 24 hr tablet Take 50 mg by mouth daily. 06/24/19  Yes [provider]  omeprazole (PRILOSEC) 20 MG capsule Take 20 mg by mouth every other day.    Yes [provider]  OVER THE COUNTER MEDICATION Take 2 tablets by mouth daily. Neurop Away   Yes [provider]  OVER THE COUNTER MEDICATION Take 30 mg by mouth daily. CBD gummie   Yes [provider]   traMADol (ULTRAM) 50 MG tablet Take 50 mg by mouth 2 (two) times daily as needed for moderate pain.  07/15/19  Yes [provider]   Physical Exam: Blood pressure (!) 116/56, pulse (!) 110, temperature 98.4 F (36.9 C), temperature source Oral, resp. rate 20, height 5\' 1"  (1.549 m), weight 78.5 kg, SpO2 94 %. Vitals:   09/08/19 1119 09/08/19 1322  BP: 111/78 (!) 116/56  Pulse: (!) 116 (!) 110  Resp: 17 20  Temp: 98.7 F (37.1 C) 98.4 F (36.9 C)  SpO2: 90% 94%     General: No acute distress  Eyes: PERRL  ENT: No rash no jaundice  Neck: Supple no JVD  Cardiovascular: Regular, tacky, soft systolic murmur on apex, no peripheral edema  Respiratory: Clear breathing bilaterally, no crackles no wheezing, normal breathing effort  Abdomen: Soft nontender nondistended  Skin: No rash no ulcers  Musculoskeletal: Normal muscle tone  Psychiatric: Calm  Neurologic: No focal deficit  Labs on Admission:  Basic Metabolic Panel: Recent Labs  Lab 09/02/19 0933 09/07/19 0606  NA 137 138  K 3.9 3.7  CL 99 100  CO2 28 20*  GLUCOSE 107* 108*  BUN 17 14  CREATININE 0.88 0.73  CALCIUM 9.4 8.1*   Liver Function Tests: No results for input(s): AST, ALT, ALKPHOS, BILITOT, PROT, ALBUMIN in the last 168 hours. No results for input(s): LIPASE, AMYLASE in the last 168 hours. No results for input(s): AMMONIA in the last 168 hours. CBC: Recent Labs  Lab 09/02/19 0933 09/07/19 0606  WBC 10.4 10.8*  HGB 13.9 13.7  HCT 44.4 45.1  MCV 84.3 86.7  PLT 395 PLATELET CLUMPS NOTED ON SMEAR, COUNT APPEARS ADEQUATE   Cardiac Enzymes: No results for input(s): CKTOTAL, CKMB, CKMBINDEX, TROPONINI in the last 168 hours. BNP: Invalid input(s): POCBNP CBG: No results for input(s): GLUCAP in the last 168 hours.  Radiological Exams on Admission: DG Pelvis Portable  Result Date: 09/06/2019 CLINICAL DATA:  Total right hip replacement. EXAM: PORTABLE PELVIS 1-2 VIEWS COMPARISON:  No  prior. FINDINGS: Total right hip replacement. Hardware intact. Anatomic alignment. Peripheral vascular calcification. IMPRESSION: 1. Total right hip replacement with anatomic alignment. No acute bony abnormality. 2.  Peripheral vascular disease. Electronically Signed   By: Maisie Fus  Register   On: 09/06/2019 15:14   DG Chest Port 1V same Day  Result Date: 09/08/2019 CLINICAL DATA:  Oxygen desaturation. EXAM: PORTABLE CHEST 1 VIEW COMPARISON:  CT abdomen 11/09/2015 FINDINGS: Linear densities in the mid and lower left chest. Few streaky densities in the medial right lung base. Upper lungs are clear. Negative for pneumothorax. Heart size is within normal limits. Bone structures are unremarkable. IMPRESSION: Streaky and linear densities in both lungs, left side greater than right. Findings are most compatible with atelectasis and possibly scarring. Electronically Signed   By: Richarda Overlie M.D.   On: 09/08/2019 12:42   DG C-Arm 1-60 Min  Result Date: 09/06/2019 CLINICAL DATA:  Total hip replacement. EXAM: OPERATIVE right HIP (WITH PELVIS IF PERFORMED) 3 VIEWS TECHNIQUE: Fluoroscopic spot image(s) were submitted for interpretation post-operatively. COMPARISON:  07/20/2019 FINDINGS: Total right hip replacement. Hardware intact. Anatomic alignment. 0 minutes 22 seconds fluoroscopy time utilized. IMPRESSION: Total right hip replacement anatomic alignment. Electronically Signed   By: Maisie Fus  Register   On: 09/06/2019 14:38   DG HIP OPERATIVE UNILAT WITH PELVIS RIGHT  Result Date: 09/06/2019 CLINICAL DATA:  Total hip replacement. EXAM: OPERATIVE right HIP (WITH PELVIS IF PERFORMED) 3 VIEWS TECHNIQUE: Fluoroscopic spot image(s) were submitted for interpretation post-operatively. COMPARISON:  07/20/2019 FINDINGS: Total right hip replacement. Hardware intact. Anatomic alignment. 0 minutes 22 seconds fluoroscopy time utilized. IMPRESSION: Total right hip replacement anatomic alignment. Electronically Signed   By: Maisie Fus   Register   On: 09/06/2019 14:38    EKG: Ordered  Time spent: 45  Emeline General [facility] Pager [phone]   09/08/2019, 1:25 PM

## 2019-09-09 ENCOUNTER — Inpatient Hospital Stay (HOSPITAL_COMMUNITY): Payer: Medicare Other

## 2019-09-09 DIAGNOSIS — R0609 Other forms of dyspnea: Secondary | ICD-10-CM

## 2019-09-09 LAB — ECHOCARDIOGRAM COMPLETE
Height: 61 in
Weight: 2768 oz

## 2019-09-09 MED ORDER — OXYCODONE HCL 5 MG PO TABS: 5.0000 mg | ORAL_TABLET | ORAL | 0 refills | Status: DC | PRN

## 2019-09-09 MED ORDER — ASPIRIN 81 MG PO CHEW: 81.0000 mg | CHEWABLE_TABLET | Freq: Two times a day (BID) | ORAL | 0 refills | Status: DC

## 2019-09-09 NOTE — Progress Notes (Signed)
Patient ID: Hannah Yu, female   DOB: 21-May-1945, 74 y.o.   MRN: [id number] Has done much better overall today.  Worked well with therapy.  Does feel tired.  Vitals stable.  I anticipated she will be able to be discharged to home tomorrow.

## 2019-09-09 NOTE — Plan of Care (Signed)
  Problem: Education: Goal: Knowledge of General Education information will improve Description: Including pain rating scale, medication(s)/side effects and non-pharmacologic comfort measures Outcome: Progressing   Problem: Education: Goal: Knowledge of the prescribed therapeutic regimen will improve Outcome: Progressing   Problem: Activity: Goal: Ability to avoid complications of mobility impairment will improve Outcome: Progressing   Problem: Clinical Measurements: Goal: Postoperative complications will be avoided or minimized Outcome: Progressing   Problem: Pain Management: Goal: Pain level will decrease with appropriate interventions Outcome: Progressing   Problem: Skin Integrity: Goal: Will show signs of wound healing Outcome: Progressing   

## 2019-09-09 NOTE — Progress Notes (Signed)
74 year old lady with history of hypertension, sinus tachycardia, osteoarthritis came in for elective hip replacement which was done 2 days ago, was found to be hypoxic per surgery. Medical service consult acute respiratory failure with hypoxia. VQ scan showed low probability of PE and her oxygen sats at rest have been around 96 to 97% on room air today.  Echocardiogram done today for further evaluation results are pending. Patient was seen and examined by Dr. Chipper Herb earlier this morning in [facility] will continue to follow-up tomorrow.   Kathlen Mody, MD

## 2019-09-09 NOTE — Progress Notes (Addendum)
Physical Therapy Treatment Patient Details Name: Hannah Yu MRN: [id number] DOB: 12/02/1945 Today's Date: 09/09/2019    History of Present Illness Pt is a 74 y/o female s/p R THA, direct anterior. PMH includes HTN and sciatica.     PT Comments    Pt seated in recliner and eager to mobilize this pm.  Started with standing exercises and patient started to fatigue quickly.  Pt required seated rest break edge of bed before gt training.  Gt distance limited this pm due to fatigue and patient transferred back to bed to rest.  Will f/u tomorrow to review HEP and stair training.  HEP issued today for home use.   SPO2 88%-96% on RA no symptoms when desaturation occurs.    Follow Up Recommendations  Follow surgeon's recommendation for DC plan and follow-up therapies;Supervision for mobility/OOB     Equipment Recommendations  Rolling walker with 5" wheels;3in1 (PT)    Recommendations for Other Services       Precautions / Restrictions Precautions Precautions: None Restrictions Weight Bearing Restrictions: Yes RLE Weight Bearing: Weight bearing as tolerated    Mobility  Bed Mobility Overal bed mobility: Needs Assistance Bed Mobility: Sit to Supine      Sit to supine: Min assist   General bed mobility comments: Pt utilized gt belt to lift RLE, required min assistance for scooting and positioning in bed.  Transfers Overall transfer level: Needs assistance Equipment used: Rolling walker (2 wheeled) Transfers: Sit to/from Stand Sit to Stand: Min guard         General transfer comment: Cues for hand placement to push from bed to rise into a standing position.  Ambulation/Gait Ambulation/Gait assistance: Supervision Gait Distance (Feet): 40 Feet Assistive device: Rolling walker (2 wheeled) Gait Pattern/deviations: Trunk flexed;Antalgic;Decreased step length - left;Decreased stance time - right;Decreased dorsiflexion - right;Step-through pattern     General Gait  Details: Decreased gt this pm,due to fatigue.   Stairs    Wheelchair Mobility    Modified Rankin (Stroke Patients Only)       Balance Overall balance assessment: Needs assistance Sitting-balance support: No upper extremity supported;Feet supported Sitting balance-Leahy Scale: Good       Standing balance-Leahy Scale: Fair Standing balance comment: heavy reliance on B UE support.                            Cognition Arousal/Alertness: Awake/alert Behavior During Therapy: WFL for tasks assessed/performed Overall Cognitive Status: Within Functional Limits for tasks assessed                                        Exercises Total Joint Exercises  Hip ABduction/ADduction: (demonstrated standing hip abduction as she was too fatigued to complete.) Knee Flexion: AROM;Right;10 reps;Sidelying Marching in Standing: AAROM;Right;10 reps;Standing Standing Hip Extension: Right;10 reps;Standing;AAROM    General Comments        Pertinent Vitals/Pain Pain Assessment: Faces Faces Pain Scale: Hurts even more Pain Location: R hip  Pain Descriptors / Indicators: Tightness Pain Intervention(s): Monitored during session;Repositioned    Home Living                      Prior Function            PT Goals (current goals can now be found in the care plan section) Acute Rehab PT Goals Patient  Stated Goal: to go home Potential to Achieve Goals: Good Progress towards PT goals: Progressing toward goals    Frequency    7X/week      PT Plan Current plan remains appropriate    Co-evaluation              AM-PAC PT "6 Clicks" Mobility   Outcome Measure  Help needed turning from your back to your side while in a flat bed without using bedrails?: A Little Help needed moving from lying on your back to sitting on the side of a flat bed without using bedrails?: A Little Help needed moving to and from a bed to a chair (including a  wheelchair)?: A Little Help needed standing up from a chair using your arms (e.g., wheelchair or bedside chair)?: A Little Help needed to walk in hospital room?: A Little Help needed climbing 3-5 steps with a railing? : A Little 6 Click Score: 18    End of Session Equipment Utilized During Treatment: Gait belt Activity Tolerance: Patient tolerated treatment well Patient left: with call bell/phone within reach;in bed;with bed alarm set Nurse Communication: Mobility status PT Visit Diagnosis: Other abnormalities of gait and mobility (R26.89);Pain Pain - Right/Left: Right Pain - part of body: Hip     Time: 7824-2353 PT Time Calculation (min) (ACUTE ONLY): 27 min  Charges:  $Gait Training: 8-22 mins $Therapeutic Exercise: 8-22 mins                     Hannah Yu , PTA Acute Rehabilitation Services Pager [phone] Office [phone]     Hannah Yu Artis Delay 09/09/2019, 4:50 PM

## 2019-09-09 NOTE — Progress Notes (Signed)
Patient ID: Hannah Yu, female   DOB: 05/03/1946, 74 y.o.   MRN: [id number] The patient appears comfortable this morning.  She is awake and alert.  She is on supplemental oxygen.  She denies any shortness of breath or chest pain.  I do appreciate Dr. Chipper Herb with [facility] consulting on this patient given her hypoxia.  The VQ scan showed low probability of a PE.  Hopefully today, she will make better progress with physical therapy and they can work on stairs with her.  We will likely keep her here today with hopeful discharge tomorrow depending on her progress overall.

## 2019-09-09 NOTE — Plan of Care (Signed)
  Problem: Education: Goal: Knowledge of General Education information will improve Description: Including pain rating scale, medication(s)/side effects and non-pharmacologic comfort measures Outcome: Progressing   Problem: Activity: Goal: Ability to avoid complications of mobility impairment will improve Outcome: Progressing   Problem: Pain Management: Goal: Pain level will decrease with appropriate interventions Outcome: Progressing   Problem: Skin Integrity: Goal: Will show signs of wound healing Outcome: Progressing

## 2019-09-09 NOTE — Progress Notes (Signed)
Echocardiogram 2D Echocardiogram has been performed.  Hannah Yu 09/09/2019, 10:19 AM

## 2019-09-09 NOTE — Progress Notes (Signed)
Physical Therapy Treatment Patient Details Name: Hannah Yu MRN: [id number] DOB: 05-26-1945 Today's Date: 09/09/2019    History of Present Illness Pt is a 74 y/o female s/p R THA, direct anterior. PMH includes HTN and sciatica.     PT Comments    Pt supine in bed.  She remains on 1.5L [place].  SPo2 2L during gt training due to tank setting and she continues to fluctuate between 84% and 95%.  Pt able to progress to stair training this am.  No c/o dizziness post session.     Follow Up Recommendations  Follow surgeon's recommendation for DC plan and follow-up therapies;Supervision for mobility/OOB     Equipment Recommendations  Rolling walker with 5" wheels;3in1 (PT)    Recommendations for Other Services       Precautions / Restrictions Precautions Precautions: None Restrictions Weight Bearing Restrictions: Yes RLE Weight Bearing: Weight bearing as tolerated    Mobility  Bed Mobility Overal bed mobility: Needs Assistance Bed Mobility: Supine to Sit     Supine to sit: Supervision     General bed mobility comments: Increased time and effort but able to mobilize to edge of bed unassisted.  Increased effort to scoot to edge of bed.  Transfers Overall transfer level: Needs assistance Equipment used: Rolling walker (2 wheeled) Transfers: Sit to/from Stand Sit to Stand: Min guard         General transfer comment: Cues for hand placement to push from bed to rise into a standing position.  Ambulation/Gait Ambulation/Gait assistance: Supervision Gait Distance (Feet): 120 Feet Assistive device: Rolling walker (2 wheeled) Gait Pattern/deviations: Trunk flexed;Antalgic;Decreased step length - left;Decreased stance time - right;Decreased dorsiflexion - right;Step-through pattern     General Gait Details: Cues for upper trunk control, gt symmetry and RW safety.   Stairs Stairs: Yes Stairs assistance: Min guard Stair Management: Two rails Number of Stairs: 4 General  stair comments: Cues for sequencing and hand placement on railings.   Wheelchair Mobility    Modified Rankin (Stroke Patients Only)       Balance Overall balance assessment: Needs assistance Sitting-balance support: No upper extremity supported;Feet supported Sitting balance-Leahy Scale: Good       Standing balance-Leahy Scale: Fair                              Cognition                                              Exercises Total Joint Exercises Ankle Circles/Pumps: AROM;Both;20 reps;Supine Quad Sets: AROM;Right;10 reps;Supine Heel Slides: AAROM;Right;10 reps;Supine Hip ABduction/ADduction: AAROM;Right;10 reps;Supine    General Comments        Pertinent Vitals/Pain Pain Assessment: Faces Faces Pain Scale: Hurts little more Pain Location: R hip  Pain Descriptors / Indicators: Tightness Pain Intervention(s): Monitored during session;Repositioned    Home Living                      Prior Function            PT Goals (current goals can now be found in the care plan section) Acute Rehab PT Goals Patient Stated Goal: to go home Potential to Achieve Goals: Good Progress towards PT goals: Progressing toward goals    Frequency    7X/week  PT Plan Current plan remains appropriate    Co-evaluation              AM-PAC PT "6 Clicks" Mobility   Outcome Measure  Help needed turning from your back to your side while in a flat bed without using bedrails?: A Little Help needed moving from lying on your back to sitting on the side of a flat bed without using bedrails?: A Little Help needed moving to and from a bed to a chair (including a wheelchair)?: A Little Help needed standing up from a chair using your arms (e.g., wheelchair or bedside chair)?: A Little Help needed to walk in hospital room?: A Little Help needed climbing 3-5 steps with a railing? : A Little 6 Click Score: 18    End of Session Equipment  Utilized During Treatment: Gait belt Activity Tolerance: Patient tolerated treatment well Patient left: with call bell/phone within reach;in chair;with chair alarm set Nurse Communication: Mobility status PT Visit Diagnosis: Other abnormalities of gait and mobility (R26.89);Pain Pain - Right/Left: Right Pain - part of body: Hip     Time: 1022-1042 PT Time Calculation (min) (ACUTE ONLY): 20 min  Charges:  $Gait Training: 8-22 mins                     Erasmo Leventhal , PTA Acute Rehabilitation Services Pager [phone] Office [place] 09/09/2019, 3:45 PM

## 2019-09-10 DIAGNOSIS — R0902 Hypoxemia: Secondary | ICD-10-CM

## 2019-09-10 LAB — CBC
HCT: 30.7 % — ABNORMAL LOW (ref 36.0–46.0)
Hemoglobin: 9.5 g/dL — ABNORMAL LOW (ref 12.0–15.0)
MCH: 26.4 pg (ref 26.0–34.0)
MCHC: 30.9 g/dL (ref 30.0–36.0)
MCV: 85.3 fL (ref 80.0–100.0)
Platelets: 363 10*3/uL (ref 150–400)
RBC: 3.6 MIL/uL — ABNORMAL LOW (ref 3.87–5.11)
RDW: 15.4 % (ref 11.5–15.5)
WBC: 10.1 10*3/uL (ref 4.0–10.5)
nRBC: 0 % (ref 0.0–0.2)

## 2019-09-10 LAB — BASIC METABOLIC PANEL
Anion gap: 11 (ref 5–15)
BUN: 10 mg/dL (ref 8–23)
CO2: 28 mmol/L (ref 22–32)
Calcium: 8.5 mg/dL — ABNORMAL LOW (ref 8.9–10.3)
Chloride: 94 mmol/L — ABNORMAL LOW (ref 98–111)
Creatinine, Ser: 0.74 mg/dL (ref 0.44–1.00)
GFR calc Af Amer: >60 mL/min (ref >60)
GFR calc non Af Amer: >60 mL/min (ref >60)
Glucose, Bld: 128 mg/dL — ABNORMAL HIGH (ref 70–99)
Potassium: 3.2 mmol/L — ABNORMAL LOW (ref 3.5–5.1)
Sodium: 133 mmol/L — ABNORMAL LOW (ref 135–145)

## 2019-09-10 NOTE — Progress Notes (Signed)
Physical Therapy Treatment Patient Details Name: Hannah Yu MRN: [id number] DOB: 02-21-1946 Today's Date: 09/10/2019    History of Present Illness Pt is a 74 y/o female s/p R THA, direct anterior. PMH includes HTN and sciatica.     PT Comments    Session focused on continued review of home exercise program for strengthening, gait training, and stair training. Pt ambulating 75 feet x 2 with a walker at a supervision level, requires a seated rest break in between bouts. Able to trial various methods of negotiating steps to determine safest technique and to simulate home set up. Pt determining she prefers holding onto left railing and using a cane in her right hand (has cane already). Recommended having assist/supervision available and having a chair set up at the top to take a rest break if needed. Overall, pt making good progress towards her physical therapy goals.   Of note, pt HR elevated both at rest (113 bpm) and with activity (142 upon return to recliner post mobility). SpO2 98% on RA. Pt displays decreased endurance and requires frequent rest breaks.     Follow Up Recommendations  Follow surgeon's recommendation for DC plan and follow-up therapies;Supervision for mobility/OOB     Equipment Recommendations  Rolling walker with 5" wheels;3in1 (PT)    Recommendations for Other Services       Precautions / Restrictions Precautions Precautions: Other (comment) Precaution Comments: watch HR Restrictions Weight Bearing Restrictions: Yes RLE Weight Bearing: Weight bearing as tolerated    Mobility  Bed Mobility Overal bed mobility: Modified Independent Bed Mobility: Supine to Sit           General bed mobility comments: HOB elevated, increased time, no physical assist required  Transfers Overall transfer level: Needs assistance Equipment used: Rolling walker (2 wheeled) Transfers: Sit to/from Stand Sit to Stand: Supervision         General transfer comment:  Cues for hand placement  Ambulation/Gait Ambulation/Gait assistance: Supervision Gait Distance (Feet): 150 Feet(75", 75") Assistive device: Rolling walker (2 wheeled) Gait Pattern/deviations: Antalgic;Decreased dorsiflexion - right;Step-through pattern;Decreased stance time - right Gait velocity: decreased   General Gait Details: Cues for neutral right foot positioning, no gross imbalance   Stairs Stairs: Yes Stairs assistance: Min guard Stair Management: Two rails;With cane;With walker;Sideways;Forwards;Backwards Number of Stairs: 15(3 sets of 5) General stair comments: Trialed with walker backwards ascending and forwards descending, two rails, left rail sideways, and with left rail and cane. Pt prefers method with cane. Requiring min guard assist. Cues for technique, sequencing.   Wheelchair Mobility    Modified Rankin (Stroke Patients Only)       Balance Overall balance assessment: Needs assistance Sitting-balance support: No upper extremity supported;Feet supported Sitting balance-Leahy Scale: Good     Standing balance support: Bilateral upper extremity supported Standing balance-Leahy Scale: Fair                              Cognition Arousal/Alertness: Awake/alert Behavior During Therapy: WFL for tasks assessed/performed Overall Cognitive Status: Within Functional Limits for tasks assessed                                        Exercises Total Joint Exercises Quad Sets: Both;15 reps;Supine Heel Slides: Right;AAROM;10 reps;Supine Hip ABduction/ADduction: Right;10 reps;Supine    General Comments        Pertinent Vitals/Pain  Pain Assessment: Faces Faces Pain Scale: Hurts even more Pain Location: R hip  Pain Descriptors / Indicators: Tightness Pain Intervention(s): Monitored during session;Patient requesting pain meds-RN notified    Home Living                      Prior Function            PT Goals (current  goals can now be found in the care plan section) Acute Rehab PT Goals Patient Stated Goal: to get dressed and go home Potential to Achieve Goals: Good Progress towards PT goals: Progressing toward goals    Frequency    7X/week      PT Plan Current plan remains appropriate    Co-evaluation              AM-PAC PT "6 Clicks" Mobility   Outcome Measure  Help needed turning from your back to your side while in a flat bed without using bedrails?: None Help needed moving from lying on your back to sitting on the side of a flat bed without using bedrails?: None Help needed moving to and from a bed to a chair (including a wheelchair)?: None Help needed standing up from a chair using your arms (e.g., wheelchair or bedside chair)?: None Help needed to walk in hospital room?: None Help needed climbing 3-5 steps with a railing? : A Little 6 Click Score: 23    End of Session Equipment Utilized During Treatment: Gait belt Activity Tolerance: Patient tolerated treatment well Patient left: in chair;with call bell/phone within reach;with chair alarm set Nurse Communication: Mobility status PT Visit Diagnosis: Other abnormalities of gait and mobility (R26.89);Pain Pain - Right/Left: Right Pain - part of body: Hip     Time: 2694-8546 PT Time Calculation (min) (ACUTE ONLY): 31 min  Charges:  $Gait Training: 23-37 mins                       Hannah Yu, PT, DPT Acute Rehabilitation Services Pager [phone] Office [phone]    Hannah Yu 09/10/2019, 8:52 AM

## 2019-09-10 NOTE — Plan of Care (Addendum)
Pt placed back on [place] d/t O2 desating to the 70s. Pt currently on 2L sating in the mid 90s.   Problem: Education: Goal: Knowledge of General Education information will improve Description: Including pain rating scale, medication(s)/side effects and non-pharmacologic comfort measures Outcome: Progressing   Problem: Activity: Goal: Ability to avoid complications of mobility impairment will improve Outcome: Progressing   Problem: Pain Management: Goal: Pain level will decrease with appropriate interventions Outcome: Progressing   Problem: Skin Integrity: Goal: Will show signs of wound healing Outcome: Progressing

## 2019-09-10 NOTE — Progress Notes (Addendum)
PROGRESS NOTE    Hannah Yu  LKG:[id number] DOB: Sep 02, 1945 DOA: 09/06/2019 PCP: Jerl Mina, MD  Brief Narrative:  74 year old lady with history of hypertension, sinus tachycardia, osteoarthritis came in for elective hip replacement which was done 2 days ago, was found to be hypoxic per surgery. Medical service consult acute respiratory failure with hypoxia. VQ scan showed low probability of PE and her oxygen sats at rest have been around 96 to 97% on room air today.  Echocardiogram done , but is not significant for any abnormalities.   Assessment & Plan:   Principal Problem:   Primary osteoarthritis of right hip Active Problems:   Status post total replacement of right hip  Acute respiratory failure with hypoxia probably secondary to postop sedation.  She is weaned off oxygen at this time and her sats have been around 98% on ambulation on room air.  VQ scan shows low probability for PE Echocardiogram was not significant for any abnormalities. Patient is medically stable for discharge at this time.  Hypertension Fairly controlled resume home medications on discharge.    Right hip osteoarthritis s/p right hip replacement 6 as per orthopedics.      Subjective: Patient denies any chest pain or shortness of breath at this time  Objective: Vitals:   09/09/19 1805 09/09/19 2117 09/10/19 0517 09/10/19 0957  BP:  (!) 123/50 (!) 131/50 128/62  Pulse:  96 87 (!) 102  Resp:  18 16 15   Temp:  99.4 F (37.4 C) 97.8 F (36.6 C) 99.6 F (37.6 C)  TempSrc:  Oral Oral Oral  SpO2: 94% 93% 93% 94%  Weight:      Height:       No intake or output data in the 24 hours ending 09/10/19 1538 Filed Weights   09/06/19 1003  Weight: 78.5 kg    Examination:  General exam: Appears calm and comfortable  Respiratory system: Clear to auscultation. Respiratory effort normal. Cardiovascular system: S1 & S2 heard, RRR. No JVD,No pedal edema. Gastrointestinal system: Abdomen is  nondistended, soft and nontender.  Normal bowel sounds heard. Central nervous system: Alert and oriented. No focal neurological deficits. Extremities: no pedal edema.  Skin: No rashes, lesions or ulcers Psychiatry: Mood & affect appropriate.     Data Reviewed: I have personally reviewed following labs and imaging studies  CBC: Recent Labs  Lab 09/07/19 0606 09/08/19 1538  WBC 10.8* 12.8*  HGB 13.7 9.4*  HCT 45.1 30.3*  MCV 86.7 85.4  PLT PLATELET CLUMPS NOTED ON SMEAR, COUNT APPEARS ADEQUATE 270    Basic Metabolic Panel: Recent Labs  Lab 09/07/19 0606  NA 138  K 3.7  CL 100  CO2 20*  GLUCOSE 108*  BUN 14  CREATININE 0.73  CALCIUM 8.1*    GFR: Estimated Creatinine Clearance: 57.6 mL/min (by C-G formula based on SCr of 0.73 mg/dL).  Liver Function Tests: No results for input(s): AST, ALT, ALKPHOS, BILITOT, PROT, ALBUMIN in the last 168 hours.  CBG: No results for input(s): GLUCAP in the last 168 hours.   Recent Results (from the past 240 hour(s))  Surgical pcr screen     Status: Abnormal   Collection Time: 09/02/19  9:33 AM   Specimen: Nasal Mucosa; Nasal Swab  Result Value Ref Range Status   MRSA, PCR NEGATIVE NEGATIVE Final   Staphylococcus aureus POSITIVE (A) NEGATIVE Final    Comment: (NOTE) The Xpert SA Assay (FDA approved for NASAL specimens in patients 71 years of age and older), is one  component of a comprehensive surveillance program. It is not intended to diagnose infection nor to guide or monitor treatment. Performed at [facility], [address] [place], [place] [address]   SARS CORONAVIRUS 2 (TAT 6-24 HRS) Nasopharyngeal Nasopharyngeal Swab     Status: None   Collection Time: 09/02/19 11:16 AM   Specimen: Nasopharyngeal Swab  Result Value Ref Range Status   SARS Coronavirus 2 NEGATIVE NEGATIVE Final    Comment: (NOTE) SARS-CoV-2 target nucleic acids are NOT DETECTED. The SARS-CoV-2 RNA is generally detectable in upper and  lower respiratory specimens during the acute phase of infection. Negative results do not preclude SARS-CoV-2 infection, do not rule out co-infections with other pathogens, and should not be used as the sole basis for treatment or other patient management decisions. Negative results must be combined with clinical observations, patient history, and epidemiological information. The expected result is Negative. Fact Sheet for Patients: HairSlick.no Fact Sheet for Healthcare Providers: quierodirigir.com This test is not yet approved or cleared by the [place] FDA and  has been authorized for detection and/or diagnosis of SARS-CoV-2 by FDA under an Emergency Use Authorization (EUA). This EUA will remain  in effect (meaning this test can be used) for the duration of the COVID-19 declaration under Section 56 4(b)(1) of the Act, 21 U.S.C. section 360bbb-3(b)(1), unless the authorization is terminated or revoked sooner. Performed at [facility], [address], [place], [place] [address]          Radiology Studies: NM Pulmonary Perf and Vent  Result Date: 09/08/2019 CLINICAL DATA:  Oxygen desaturation. EXAM: NUCLEAR MEDICINE VENTILATION - PERFUSION LUNG SCAN TECHNIQUE: Ventilation images were obtained in multiple projections using inhaled aerosol Tc-58m DTPA. Perfusion images were obtained in multiple projections after intravenous injection of Tc-90m MAA. RADIOPHARMACEUTICALS:  1.59 mCi of Tc-52m DTPA aerosol inhalation and 40.6 mCi Tc72m MAA IV COMPARISON:  Chest x-ray dated May 6 21 FINDINGS: Ventilation: There is a defect in ventilation in the anterior left upper lobe. There is some mild clumping of radiotracer in the trachea and central airways. Perfusion: There is a mass defect within the anterior left upper lobe on the perfusion images. No mismatched defect identified on this study. IMPRESSION: 1. Low probability for pulmonary  embolism. 2. Mild central distribution of radiotracer on the ventilation portion of the exam may indicate some degree of underlying obstructive airway disease. Electronically Signed   By: Katherine Mantle M.D.   On: 09/08/2019 19:52   ECHOCARDIOGRAM COMPLETE  Result Date: 09/09/2019    ECHOCARDIOGRAM REPORT   Patient Name:   KIMIYO CARMICHEAL Date of Exam: 09/09/2019 Medical Rec #:  [id number]           Height:       61.0 in Accession #:    [id number]          Weight:       173.0 lb Date of Birth:  [birth year]            BSA:          1.776 m Patient Age:    74 years            BP:           123/59 mmHg Patient Gender: F                   HR:           99 bpm. Exam Location:  Inpatient Procedure: 2D Echo,  Color Doppler and Cardiac Doppler Indications:    R06.9 DOE  History:        Patient has no prior history of Echocardiogram examinations.                 Risk Factors:Hypertension and Dyslipidemia.  Sonographer:    Irving Burton Senior RDCS Referring Phys: [id number] [place] General  Sonographer Comments: Technically difficult study due to poor echo windows, unable to reposition patient due to recent hip replacement. IMPRESSIONS  1. Left ventricular ejection fraction, by estimation, is 55 to 60%. The left ventricle has normal function. The left ventricle has no regional wall motion abnormalities. Left ventricular diastolic function could not be evaluated.  2. Right ventricular systolic function is normal. The right ventricular size is normal.  3. Left atrial size was mildly dilated.  4. The mitral valve is grossly normal. Trivial mitral valve regurgitation. No evidence of mitral stenosis.  5. The aortic valve was not well visualized. Aortic valve regurgitation is not visualized. No aortic stenosis is present.  6. The inferior vena cava is normal in size with greater than 50% respiratory variability, suggesting right atrial pressure of 3 mmHg. FINDINGS  Left Ventricle: Left ventricular ejection fraction, by estimation, is 55 to  60%. The left ventricle has normal function. The left ventricle has no regional wall motion abnormalities. The left ventricular internal cavity size was normal in size. There is  borderline left ventricular hypertrophy. Left ventricular diastolic function could not be evaluated due to nondiagnostic images. Left ventricular diastolic function could not be evaluated. Right Ventricle: The right ventricular size is normal. Right vetricular wall thickness was not assessed. Right ventricular systolic function is normal. Left Atrium: Left atrial size was mildly dilated. Right Atrium: Right atrial size was not well visualized. Pericardium: There is no evidence of pericardial effusion. Presence of pericardial fat pad. Mitral Valve: The mitral valve is grossly normal. Trivial mitral valve regurgitation. No evidence of mitral valve stenosis. Tricuspid Valve: The tricuspid valve is grossly normal. Tricuspid valve regurgitation is trivial. No evidence of tricuspid stenosis. Aortic Valve: The aortic valve was not well visualized. Aortic valve regurgitation is not visualized. No aortic stenosis is present. Pulmonic Valve: The pulmonic valve was not well visualized. Pulmonic valve regurgitation is not visualized. Aorta: The aortic root, ascending aorta and aortic arch are all structurally normal, with no evidence of dilitation or obstruction. Venous: The inferior vena cava is normal in size with greater than 50% respiratory variability, suggesting right atrial pressure of 3 mmHg. IAS/Shunts: The interatrial septum was not well visualized.  LEFT VENTRICLE PLAX 2D LVIDd:         3.50 cm LVIDs:         2.60 cm LV PW:         1.10 cm LV IVS:        1.30 cm LVOT diam:     2.00 cm LV SV:         56 LV SV Index:   32 LVOT Area:     3.14 cm  RIGHT VENTRICLE RV S prime:     15.50 cm/s TAPSE (M-mode): 1.4 cm LEFT ATRIUM             Index       RIGHT ATRIUM          Index LA diam:        4.10 cm 2.31 cm/m  RA Area:     9.97 cm LA Vol  (A2C):   56.2  ml 31.65 ml/m RA Volume:   20.70 ml 11.66 ml/m LA Vol (A4C):   59.2 ml 33.34 ml/m LA Biplane Vol: 57.9 ml 32.60 ml/m  AORTIC VALVE LVOT Vmax:   96.47 cm/s LVOT Vmean:  67.033 cm/s LVOT VTI:    0.178 m  AORTA Ao Root diam: 3.30 cm Ao Asc diam:  3.50 cm  SHUNTS Systemic VTI:  0.18 m Systemic Diam: 2.00 cm Buford Dresser MD Electronically signed by Buford Dresser MD Signature Date/Time: 09/09/2019/1:47:01 PM    Final         Scheduled Meds: . amLODipine  10 mg Oral Daily  . aspirin  81 mg Oral BID  . docusate sodium  100 mg Oral BID  . furosemide  40 mg Oral Daily  . gabapentin  100 mg Oral TID  . losartan  100 mg Oral Daily  . metoprolol succinate  50 mg Oral Daily  . mupirocin ointment  1 application Nasal BID  . pantoprazole  40 mg Oral Daily   Continuous Infusions: . sodium chloride 20 mL/hr at 09/08/19 1503     LOS: 3 days        Hosie Poisson, MD [facility]   To contact the attending provider between 7A-7P or the covering provider during after hours 7P-7A, please log into the web site [email or web address] and access using universal [place] password for that web site. If you do not have the password, please call the hospital operator.  09/10/2019, 3:38 PM

## 2019-09-10 NOTE — Progress Notes (Signed)
Physical Therapy Treatment Patient Details Name: Hannah Yu MRN: [id number] DOB: [birth year] Today's Date: 09/10/2019    History of Present Illness Pt is a 74 y/o female s/p R THA, direct anterior. PMH includes HTN and sciatica.     PT Comments    Patient is progressing very well towards their physical therapy goals. Ambulating 100 feet x 2 with a walker and a seated rest break. Able to perform standing exercises for R hip/knee strengthening. Unable to obtain HR/O2 with pulse oximeter. Continues with decreased endurance and activity tolerance. Education provided regarding endurance conservation techniques and activity recommendations.     Follow Up Recommendations  Supervision for mobility/OOB;Follow surgeon's recommendation for DC plan and follow-up therapies     Equipment Recommendations  Rolling walker with 5" wheels;3in1 (PT)    Recommendations for Other Services       Precautions / Restrictions Precautions Precautions: Other (comment) Precaution Comments: watch HR Restrictions Weight Bearing Restrictions: Yes RLE Weight Bearing: Weight bearing as tolerated    Mobility  Bed Mobility Overal bed mobility: Modified Independent Bed Mobility: Sit to Supine           General bed mobility comments: HOB elevated, increased time, no physical assist required  Transfers Overall transfer level: Needs assistance Equipment used: Rolling walker (2 wheeled) Transfers: Sit to/from Stand Sit to Stand: Supervision         General transfer comment: Cues for hand placement  Ambulation/Gait Ambulation/Gait assistance: Supervision Gait Distance (Feet): 200 Feet(100",100") Assistive device: Rolling walker (2 wheeled) Gait Pattern/deviations: Antalgic;Decreased dorsiflexion - right;Step-through pattern;Decreased stance time - right Gait velocity: decreased   General Gait Details: Cues for decreased right step length, activity pacing   Stairs              Wheelchair Mobility    Modified Rankin (Stroke Patients Only)       Balance Overall balance assessment: Needs assistance Sitting-balance support: No upper extremity supported;Feet supported Sitting balance-Leahy Scale: Good     Standing balance support: Bilateral upper extremity supported Standing balance-Leahy Scale: Fair                              Cognition Arousal/Alertness: Awake/alert Behavior During Therapy: WFL for tasks assessed/performed Overall Cognitive Status: Within Functional Limits for tasks assessed                                        Exercises Total Joint Exercises Hip ABduction/ADduction: Standing;10 reps;Right Long Arc Quad: Both;10 reps;Seated Knee Flexion: Right;10 reps;Standing Standing Hip Extension: Right;10 reps;Standing General Exercises - Lower Extremity Hip Flexion/Marching: Both;10 reps;Seated    General Comments        Pertinent Vitals/Pain Pain Assessment: Faces Faces Pain Scale: Hurts little more Pain Location: R hip  Pain Descriptors / Indicators: Tightness Pain Intervention(s): Monitored during session;RN gave pain meds during session    Home Living                      Prior Function            PT Goals (current goals can now be found in the care plan section) Acute Rehab PT Goals Patient Stated Goal: to get dressed and go home Potential to Achieve Goals: Good Progress towards PT goals: Progressing toward goals    Frequency    7X/week  PT Plan Current plan remains appropriate    Co-evaluation              AM-PAC PT "6 Clicks" Mobility   Outcome Measure  Help needed turning from your back to your side while in a flat bed without using bedrails?: None Help needed moving from lying on your back to sitting on the side of a flat bed without using bedrails?: None Help needed moving to and from a bed to a chair (including a wheelchair)?: None Help needed  standing up from a chair using your arms (e.g., wheelchair or bedside chair)?: None Help needed to walk in hospital room?: None Help needed climbing 3-5 steps with a railing? : A Little 6 Click Score: 23    End of Session Equipment Utilized During Treatment: Gait belt Activity Tolerance: Patient tolerated treatment well Patient left: with call bell/phone within reach;in bed;with bed alarm set Nurse Communication: Mobility status PT Visit Diagnosis: Other abnormalities of gait and mobility (R26.89);Pain Pain - Right/Left: Right Pain - part of body: Hip     Time: 7711-6579 PT Time Calculation (min) (ACUTE ONLY): 20 min  Charges:  $Therapeutic Exercise: 8-22 mins                       Lillia Pauls, PT, DPT Acute Rehabilitation Services Pager [phone] Office [phone]    Norval Morton 09/10/2019, 4:20 PM

## 2019-09-10 NOTE — Progress Notes (Addendum)
     Subjective: 4 Days Post-Op Procedure(s) (LRB): RIGHT TOTAL HIP ARTHROPLASTY ANTERIOR APPROACH (Right) Awake, alert and oriented x 4. She is sitting in recliner at bed side with oxygen per [place], on O2 sat monitor. Has been given lasix for diuresis to improve oxygenation and assessing heart rhythm. Wants to know if she can go home. Patient reports pain as mild.    Objective:   VITALS:  Temp:  [97.8 F (36.6 C)-99.6 F (37.6 C)] 99.6 F (37.6 C) (05/08 0957) Pulse Rate:  [87-102] 102 (05/08 0957) Resp:  [15-18] 15 (05/08 0957) BP: (114-131)/(49-62) 128/62 (05/08 0957) SpO2:  [93 %-95 %] 94 % (05/08 0957)  Neurologically intact ABD soft Neurovascular intact Sensation intact distally Intact pulses distally Dorsiflexion/Plantar flexion intact Incision: scant drainage No cellulitis present Compartment soft  V/Q scan low probability of PE. LABS Recent Labs    09/08/19 1538  HGB 9.4*  WBC 12.8*  PLT 270   No results for input(s): NA, K, CL, CO2, BUN, CREATININE, GLUCOSE in the last 72 hours. No results for input(s): LABPT, INR in the last 72 hours.   Assessment/Plan: 4 Days Post-Op Procedure(s) (LRB): RIGHT TOTAL HIP ARTHROPLASTY ANTERIOR APPROACH (Right)  Advance diet Up with therapy Plan for discharge tomorrow  Will assess for any concern of oxygenation or cardiac concern and discharge when medically Stable.  Hannah Yu 09/10/2019, 11:09 AMPatient ID: Hannah Yu, female   DOB: 07/15/1945, 74 y.o.   MRN: [id number]

## 2019-09-11 MED ORDER — POTASSIUM CHLORIDE CRYS ER 20 MEQ PO TBCR
40.0000 meq | EXTENDED_RELEASE_TABLET | Freq: Once | ORAL | Status: AC
  Administered 2019-09-11: 40 meq via ORAL
  Filled 2019-09-11: qty 2

## 2019-09-11 NOTE — Progress Notes (Signed)
SATURATION QUALIFICATIONS: (This note is used to comply with regulatory documentation for home oxygen)  Patient Saturations on Room Air at Rest = 95%  Patient Saturations on Room Air while Ambulating = 73%  Patient Saturations on 2 Liters of oxygen while Ambulating = 90%  Please briefly explain why patient needs home oxygen: To maintain oxygen saturations > 90% while ambulating.    Lillia Pauls, PT, DPT Acute Rehabilitation Services Pager [phone] Office [phone]

## 2019-09-11 NOTE — TOC Transition Note (Signed)
Transition of Care [facility]) - CM/SW Discharge Note   Patient Details  Name: Hannah Yu MRN: [id number] Date of Birth: [birth year]  [facility]) CM/SW Contact:  Deveron Furlong, RN 09/11/2019, 2:52 PM   Clinical Narrative:    Patient received 3n1 and RW.  HH set up previously with [facility].    DME O2 added today.  Referral called to [facility] with [employer], who will deliver O2 to hospital shortly.    Final next level of care: Home w Home Health Services Barriers to Discharge: Continued Medical Work up   Patient Goals and CMS Choice Patient states their goals for this hospitalization and ongoing recovery are:: Patient is planning to go home with home health CMS Medicare.gov Compare Post Acute Care list provided to:: Patient Choice offered to / list presented to : Patient   Discharge Plan and Services   Discharge Planning Services: CM Consult Post Acute Care Choice: Durable Medical Equipment          DME Arranged: 3-N-1, Walker rolling DME Agency: [facility]) Date DME Agency Contacted: 09/11/19 Time DME Agency Contacted: [phone] Representative spoke with at DME Agency: [facility] HH Arranged: PT HH Agency: [facility] ([facility]          Readmission Risk Interventions Readmission Risk Prevention Plan 09/07/2019  Post Dischage Appt Complete  Medication Screening Complete  Transportation Screening Complete  Some recent data might be hidden

## 2019-09-11 NOTE — Discharge Instructions (Signed)
INSTRUCTIONS AFTER JOINT REPLACEMENT   o Remove items at home which could result in a fall. This includes throw rugs or furniture in walking pathways o ICE to the affected joint every three hours while awake for 30 minutes at a time, for at least the first 3-5 days, and then as needed for pain and swelling.  Continue to use ice for pain and swelling. You may notice swelling that will progress down to the foot and ankle.  This is normal after surgery.  Elevate your leg when you are not up walking on it.   o Continue to use the breathing machine you got in the hospital (incentive spirometer) which will help keep your temperature down.  It is common for your temperature to cycle up and down following surgery, especially at night when you are not up moving around and exerting yourself.  The breathing machine keeps your lungs expanded and your temperature down.   DIET:  As you were doing prior to hospitalization, we recommend a well-balanced diet.  DRESSING / WOUND CARE / SHOWERING  Keep the surgical dressing until follow up.  The dressing is water proof, so you can shower without any extra covering.  IF THE DRESSING FALLS OFF or the wound gets wet inside, change the dressing with sterile gauze.  Please use good hand washing techniques before changing the dressing.  Do not use any lotions or creams on the incision until instructed by your surgeon.    ACTIVITY  o Increase activity slowly as tolerated, but follow the weight bearing instructions below.   o No driving for 6 weeks or until further direction given by your physician.  You cannot drive while taking narcotics.  o No lifting or carrying greater than 10 lbs. until further directed by your surgeon. o Avoid periods of inactivity such as sitting longer than an hour when not asleep. This helps prevent blood clots.  o You may return to work once you are authorized by your doctor.     WEIGHT BEARING   Weight bearing as tolerated with assist  device (walker, cane, etc) as directed, use it as long as suggested by your surgeon or therapist, typically at least 4-6 weeks.   EXERCISES  Results after joint replacement surgery are often greatly improved when you follow the exercise, range of motion and muscle strengthening exercises prescribed by your doctor. Safety measures are also important to protect the joint from further injury. Any time any of these exercises cause you to have increased pain or swelling, decrease what you are doing until you are comfortable again and then slowly increase them. If you have problems or questions, call your caregiver or physical therapist for advice.   Rehabilitation is important following a joint replacement. After just a few days of immobilization, the muscles of the leg can become weakened and shrink (atrophy).  These exercises are designed to build up the tone and strength of the thigh and leg muscles and to improve motion. Often times heat used for twenty to thirty minutes before working out will loosen up your tissues and help with improving the range of motion but do not use heat for the first two weeks following surgery (sometimes heat can increase post-operative swelling).   These exercises can be done on a training (exercise) mat, on the floor, on a table or on a bed. Use whatever works the best and is most comfortable for you.    Use music or television while you are exercising so that   the exercises are a pleasant break in your day. This will make your life better with the exercises acting as a break in your routine that you can look forward to.   Perform all exercises about fifteen times, three times per day or as directed.  You should exercise both the operative leg and the other leg as well.  Exercises include:   . Quad Sets - Tighten up the muscle on the front of the thigh (Quad) and hold for 5-10 seconds.   . Straight Leg Raises - With your knee straight (if you were given a brace, keep it on),  lift the leg to 60 degrees, hold for 3 seconds, and slowly lower the leg.  Perform this exercise against resistance later as your leg gets stronger.  . Leg Slides: Lying on your back, slowly slide your foot toward your buttocks, bending your knee up off the floor (only go as far as is comfortable). Then slowly slide your foot back down until your leg is flat on the floor again.  . Angel Wings: Lying on your back spread your legs to the side as far apart as you can without causing discomfort.  . Hamstring Strength:  Lying on your back, push your heel against the floor with your leg straight by tightening up the muscles of your buttocks.  Repeat, but this time bend your knee to a comfortable angle, and push your heel against the floor.  You may put a pillow under the heel to make it more comfortable if necessary.   A rehabilitation program following joint replacement surgery can speed recovery and prevent re-injury in the future due to weakened muscles. Contact your doctor or a physical therapist for more information on knee rehabilitation.    CONSTIPATION  Constipation is defined medically as fewer than three stools per week and severe constipation as less than one stool per week.  Even if you have a regular bowel pattern at home, your normal regimen is likely to be disrupted due to multiple reasons following surgery.  Combination of anesthesia, postoperative narcotics, change in appetite and fluid intake all can affect your bowels.   YOU MUST use at least one of the following options; they are listed in order of increasing strength to get the job done.  They are all available over the counter, and you may need to use some, POSSIBLY even all of these options:    Drink plenty of fluids (prune juice may be helpful) and high fiber foods Colace 100 mg by mouth twice a day  Senokot for constipation as directed and as needed Dulcolax (bisacodyl), take with full glass of water  Miralax (polyethylene glycol)  once or twice a day as needed.  If you have tried all these things and are unable to have a bowel movement in the first 3-4 days after surgery call either your surgeon or your primary doctor.    If you experience loose stools or diarrhea, hold the medications until you stool forms back up.  If your symptoms do not get better within 1 week or if they get worse, check with your doctor.  If you experience "the worst abdominal pain ever" or develop nausea or vomiting, please contact the office immediately for further recommendations for treatment.   ITCHING:  If you experience itching with your medications, try taking only a single pain pill, or even half a pain pill at a time.  You can also use Benadryl over the counter for itching or also to   help with sleep.   TED HOSE STOCKINGS:  Use stockings on both legs until for at least 2 weeks or as directed by physician office. They may be removed at night for sleeping.  MEDICATIONS:  See your medication summary on the "After Visit Summary" that nursing will review with you.  You may have some home medications which will be placed on hold until you complete the course of blood thinner medication.  It is important for you to complete the blood thinner medication as prescribed.  PRECAUTIONS:  If you experience chest pain or shortness of breath - call 911 immediately for transfer to the hospital emergency department.   If you develop a fever greater that 101 F, purulent drainage from wound, increased redness or drainage from wound, foul odor from the wound/dressing, or calf pain - CONTACT YOUR SURGEON.                                                   FOLLOW-UP APPOINTMENTS:  If you do not already have a post-op appointment, please call the office for an appointment to be seen by your surgeon.  Guidelines for how soon to be seen are listed in your "After Visit Summary", but are typically between 1-4 weeks after surgery.  OTHER INSTRUCTIONS:   Knee  Replacement:  Do not place pillow under knee, focus on keeping the knee straight while resting. CPM instructions: 0-90 degrees, 2 hours in the morning, 2 hours in the afternoon, and 2 hours in the evening. Place foam block, curve side up under heel at all times except when in CPM or when walking.  DO NOT modify, tear, cut, or change the foam block in any way.   DENTAL ANTIBIOTICS:  In most cases prophylactic antibiotics for Dental procdeures after total joint surgery are not necessary.  Exceptions are as follows:  1. History of prior total joint infection  2. Severely immunocompromised (Organ Transplant, cancer chemotherapy, Rheumatoid biologic meds such as Humera)  3. Poorly controlled diabetes (A1C &gt; 8.0, blood glucose over 200)  If you have one of these conditions, contact your surgeon for an antibiotic prescription, prior to your dental procedure.   MAKE SURE YOU:  . Understand these instructions.  . Get help right away if you are not doing well or get worse.    Thank you for letting us be a part of your medical care team.  It is a privilege we respect greatly.  We hope these instructions will help you stay on track for a fast and full recovery!  Dental Antibiotics:  In most cases prophylactic antibiotics for Dental procdeures after total joint surgery are not necessary.  Exceptions are as follows:  1. History of prior total joint infection  2. Severely immunocompromised (Organ Transplant, cancer chemotherapy, Rheumatoid biologic meds such as Humera)  3. Poorly controlled diabetes (A1C &gt; 8.0, blood glucose over 200)  If you have one of these conditions, contact your surgeon for an antibiotic prescription, prior to your dental procedure. It is recommend that you use post exercise oxygen 2 liters per nasal cannula for 15-20 minutes post exercise. Check your heart rate if it remains Elevated above 100 stay on the oxygen till it is under 100.  Internal medicine has   Recommended that you make an appointment to see a Pulmonary (lung) specialist with [facility] in  [place], this in follow up to the concern of recurring hypoxia with exercise found during your hospital stay.  Remain on aspirin, baby aspirin twice a day by mouth.  [facility] will [facility] but if you are experiencing pain and you need another prescription sent to a local pharmacy that is open on Sunday please call [facility] at [phone] or the answerring service at [phone]

## 2019-09-11 NOTE — Progress Notes (Signed)
PROGRESS NOTE    Hannah Yu  OHY:[id number] DOB: 26-Nov-1945 DOA: 09/06/2019 PCP: Jerl Mina, MD  Brief Narrative:  74 year old lady with history of hypertension, sinus tachycardia, osteoarthritis came in for elective hip replacement which was done 2 days ago, was found to be hypoxic per surgery. Medical service consult acute respiratory failure with hypoxia. VQ scan showed low probability of PE and her oxygen sats at rest have been around 96 to 97% on room air today.  Echocardiogram done , but is not significant for any abnormalities.  As per PT eval today, her sats have dropped to 70's on RA, But on 2 lit of oxygen have stayed well above 92%. And again at rest her sats have increased to 94 to 99% on RA.   Assessment & Plan:   Principal Problem:   Primary osteoarthritis of right hip Active Problems:   Status post total replacement of right hip  Acute respiratory failure with hypoxia on ambulation probably secondary to postop sedation vs obstructive lung disease, vs from anemia She is weaned off oxygen at this time and her sats have been around 98% at rest on RA. On Ambulation with PT, her sats dropped Hence she will be discharged on home oxygen 2 lit/min on ambulation and at night.  VQ scan shows low probability for PE. Ordered CT of the chest with contrast, but pt refused it.  She will need further work up by a pulmonologist to evaluate for obstructive lung disease with pulmonary function tests.I have put in an ambulatory referral for pulmonology at [facility]. Discussed the above in detail with the patient and with Dr Otelia Sergeant. Echocardiogram was not significant for any abnormalities. EKG shows some PVC'S with sinus arrhythmias.  Discussed with Dr Rennis Golden , suggested no further work up indicated at this time.  Patient is medically stable for discharge at this time.  Hypertension Well controlled.     Right hip osteoarthritis  s/p right hip replacement as per  orthopedics.  Acute anemia of blood loss probably from the surgery.  Baseline hemoglobin around 13, dropped to 9 post op and has remained stable around 9.5.  Recommend iron supplementation on discharge.     Hypokalemia Replaced.    Subjective: She denies any chest pain or sob, dizziness or palpitations.   Objective: Vitals:   09/10/19 1500 09/10/19 2024 09/11/19 0354 09/11/19 0900  BP: (!) 137/57 (!) 138/53 124/62 138/64  Pulse: (!) 102 83 84 88  Resp:  18 16 18   Temp: 97.7 F (36.5 C) 100.1 F (37.8 C) 98.3 F (36.8 C) 99 F (37.2 C)  TempSrc: Oral Oral Oral Oral  SpO2: 90% 93% 96% 97%  Weight:      Height:       No intake or output data in the 24 hours ending 09/11/19 1157 Filed Weights   09/06/19 1003  Weight: 78.5 kg    Examination:  General exam: alert and comfortable,  Respiratory system: clear to auscultation, no wheezing or rhonchi.  Cardiovascular system: S1S2, RRR, no JVD, no pedal edema.  Gastrointestinal system: Abdomen iis soft, non tender non distended, bowel sounds wnl.  Central nervous system: alert and oriented,  Extremities: no pedal edema.  Skin: no rashes.  Psychiatry: Mood is appropriate.    Data Reviewed: I have personally reviewed following labs and imaging studies  CBC: Recent Labs  Lab 09/07/19 0606 09/08/19 1538 09/10/19 1627  WBC 10.8* 12.8* 10.1  HGB 13.7 9.4* 9.5*  HCT 45.1 30.3* 30.7*  MCV 86.7  85.4 85.3  PLT PLATELET CLUMPS NOTED ON SMEAR, COUNT APPEARS ADEQUATE 270 363    Basic Metabolic Panel: Recent Labs  Lab 09/07/19 0606 09/10/19 1627  NA 138 133*  K 3.7 3.2*  CL 100 94*  CO2 20* 28  GLUCOSE 108* 128*  BUN 14 10  CREATININE 0.73 0.74  CALCIUM 8.1* 8.5*    GFR: Estimated Creatinine Clearance: 57.6 mL/min (by C-G formula based on SCr of 0.74 mg/dL).  Liver Function Tests: No results for input(s): AST, ALT, ALKPHOS, BILITOT, PROT, ALBUMIN in the last 168 hours.  CBG: No results for input(s): GLUCAP  in the last 168 hours.   Recent Results (from the past 240 hour(s))  Surgical pcr screen     Status: Abnormal   Collection Time: 09/02/19  9:33 AM   Specimen: Nasal Mucosa; Nasal Swab  Result Value Ref Range Status   MRSA, PCR NEGATIVE NEGATIVE Final   Staphylococcus aureus POSITIVE (A) NEGATIVE Final    Comment: (NOTE) The Xpert SA Assay (FDA approved for NASAL specimens in patients 6 years of age and older), is one component of a comprehensive surveillance program. It is not intended to diagnose infection nor to guide or monitor treatment. Performed at [facility], [address] [place], [place] [address]   SARS CORONAVIRUS 2 (TAT 6-24 HRS) Nasopharyngeal Nasopharyngeal Swab     Status: None   Collection Time: 09/02/19 11:16 AM   Specimen: Nasopharyngeal Swab  Result Value Ref Range Status   SARS Coronavirus 2 NEGATIVE NEGATIVE Final    Comment: (NOTE) SARS-CoV-2 target nucleic acids are NOT DETECTED. The SARS-CoV-2 RNA is generally detectable in upper and lower respiratory specimens during the acute phase of infection. Negative results do not preclude SARS-CoV-2 infection, do not rule out co-infections with other pathogens, and should not be used as the sole basis for treatment or other patient management decisions. Negative results must be combined with clinical observations, patient history, and epidemiological information. The expected result is Negative. Fact Sheet for Patients: HairSlick.no Fact Sheet for Healthcare Providers: quierodirigir.com This test is not yet approved or cleared by the [place] FDA and  has been authorized for detection and/or diagnosis of SARS-CoV-2 by FDA under an Emergency Use Authorization (EUA). This EUA will remain  in effect (meaning this test can be used) for the duration of the COVID-19 declaration under Section 56 4(b)(1) of the Act, 21 U.S.C. section 360bbb-3(b)(1),  unless the authorization is terminated or revoked sooner. Performed at [facility], [address] [place], [place] [address]          Radiology Studies: No results found.      Scheduled Meds: . amLODipine  10 mg Oral Daily  . aspirin  81 mg Oral BID  . docusate sodium  100 mg Oral BID  . furosemide  40 mg Oral Daily  . gabapentin  100 mg Oral TID  . losartan  100 mg Oral Daily  . metoprolol succinate  50 mg Oral Daily  . pantoprazole  40 mg Oral Daily  . potassium chloride  40 mEq Oral Once   Continuous Infusions: . sodium chloride 20 mL/hr at 09/08/19 1503     LOS: 4 days        Kathlen Mody, MD [facility]   To contact the attending provider between 7A-7P or the covering provider during after hours 7P-7A, please log into the web site [email or web address] and access using universal [place] password for that web site.  If you do not have the password, please call the hospital operator.  09/11/2019, 11:57 AM

## 2019-09-11 NOTE — Progress Notes (Signed)
     Subjective: 5 Days Post-Op Procedure(s) (LRB): RIGHT TOTAL HIP ARTHROPLASTY ANTERIOR APPROACH (Right)  Patient reports pain as mild.    Objective:   VITALS:  Temp:  [97.7 F (36.5 C)-100.1 F (37.8 C)] 99 F (37.2 C) (05/09 0900) Pulse Rate:  [83-102] 88 (05/09 0900) Resp:  [16-18] 18 (05/09 0900) BP: (124-138)/(53-64) 138/64 (05/09 0900) SpO2:  [90 %-97 %] 97 % (05/09 0900) Awake, alert and oriented x 4. She completed PT and is sitting at bedside recliner with 2l O2  pnc. I discusses why she is on oxygen and she notes that nursing checked her O2 sats and she had dropped into the 70s after returning from PT. Her pulse rate was fast and then slowed to palpation. Previous EKGs with artrial tachycardia intermittantly.  ECHO was with mild artrial enlargement.  I discontinued the O2 as she is breathing normally on RA.  Neurologically intact ABD soft Neurovascular intact Sensation intact distally Intact pulses distally Dorsiflexion/Plantar flexion intact Incision: no drainage No cellulitis present Compartment soft   LABS Recent Labs    09/08/19 1538 09/10/19 1627  HGB 9.4* 9.5*  WBC 12.8* 10.1  PLT 270 363   Recent Labs    09/10/19 1627  NA 133*  K 3.2*  CL 94*  CO2 28  BUN 10  CREATININE 0.74  GLUCOSE 128*   No results for input(s): LABPT, INR in the last 72 hours.   Assessment/Plan: 5 Days Post-Op Procedure(s) (LRB): RIGHT TOTAL HIP ARTHROPLASTY ANTERIOR APPROACH (Right)  Up with therapy  I discussed the concern of recurring bouts of atrial tachyarrythmias and low Oxygen sats with Dr. Blake Divine and she said she will reassess.     [name] 09/11/2019, 10:56 AMPatient ID: Hannah Yu, female   DOB: 11-05-1945, 74 y.o.   MRN: [id number]

## 2019-09-11 NOTE — Plan of Care (Signed)
  Problem: Education: Goal: Knowledge of General Education information will improve Description: Including pain rating scale, medication(s)/side effects and non-pharmacologic comfort measures Outcome: Completed/Met   Problem: Education: Goal: Knowledge of the prescribed therapeutic regimen will improve Outcome: Completed/Met Goal: Understanding of discharge needs will improve Outcome: Completed/Met Goal: Individualized Educational Video(s) Outcome: Completed/Met   Problem: Activity: Goal: Ability to avoid complications of mobility impairment will improve Outcome: Completed/Met Goal: Ability to tolerate increased activity will improve Outcome: Completed/Met   Problem: Clinical Measurements: Goal: Postoperative complications will be avoided or minimized Outcome: Completed/Met   Problem: Pain Management: Goal: Pain level will decrease with appropriate interventions Outcome: Completed/Met   Problem: Skin Integrity: Goal: Will show signs of wound healing Outcome: Completed/Met

## 2019-09-11 NOTE — Progress Notes (Signed)
Patient ID: Hannah Yu, female   DOB: Feb 11, 1946, 74 y.o.   MRN: [id number] Patient was seen by Dr. Blake Divine and her situation discussed with cardiology, Dr. Rennis Golden and it was felt that this will be worked up as an out patient. Dr. Blake Divine has recommended having her see a pulmonologist in [facility] with the [facility] clinic. I will discharge her today.  Oxygen following PT at home is recommended.

## 2019-09-11 NOTE — Progress Notes (Signed)
Physical Therapy Treatment Patient Details Name: Hannah Yu MRN: [id number] DOB: 1945/11/16 Today's Date: 09/11/2019    History of Present Illness Pt is a 74 y/o female s/p R THA, direct anterior. PMH includes HTN and sciatica.     PT Comments    Pt eager to go home today, has made good progress towards her physical therapy goals during her inpatient stay. Received on 2L O2, removed by PT and able to maintain 95% on RA at rest. Unfortunately, with first walking bout, she desaturated to 73% on RA. With second walking bout on 2L O2, she was able to maintain 90%. Pt stating she is refusing to wear oxygen at home. In regards to her mobility, she is ambulating hallway distances with a walker without physical assist. Has good grasp on HEP and has no further questions at this time.   Follow Up Recommendations  Supervision for mobility/OOB;Follow surgeon's recommendation for DC plan and follow-up therapies     Equipment Recommendations  Rolling walker with 5" wheels;3in1 (PT)    Recommendations for Other Services       Precautions / Restrictions Precautions Precautions: Other (comment) Precaution Comments: watch HR, O2 Restrictions Weight Bearing Restrictions: Yes RLE Weight Bearing: Weight bearing as tolerated    Mobility  Bed Mobility               General bed mobility comments: OOB in chair  Transfers Overall transfer level: Modified independent Equipment used: Rolling walker (2 wheeled)                Ambulation/Gait Ambulation/Gait assistance: Supervision Gait Distance (Feet): 200 Feet(100", 100") Assistive device: Rolling walker (2 wheeled) Gait Pattern/deviations: Antalgic;Decreased dorsiflexion - right;Step-through pattern;Decreased stance time - right;Decreased step length - left Gait velocity: decreased   General Gait Details: Cues for decreased right step length. Improved pace today and step through pattern utilized.    Stairs              Wheelchair Mobility    Modified Rankin (Stroke Patients Only)       Balance Overall balance assessment: Needs assistance Sitting-balance support: No upper extremity supported;Feet supported Sitting balance-Leahy Scale: Good     Standing balance support: Bilateral upper extremity supported Standing balance-Leahy Scale: Fair                              Cognition Arousal/Alertness: Awake/alert Behavior During Therapy: WFL for tasks assessed/performed Overall Cognitive Status: Within Functional Limits for tasks assessed                                        Exercises Total Joint Exercises Towel Squeeze: Both;10 reps;Seated Long Arc Quad: Both;10 reps;Seated General Exercises - Lower Extremity Hip Flexion/Marching: Both;10 reps;Seated    General Comments        Pertinent Vitals/Pain Pain Assessment: Faces Faces Pain Scale: Hurts little more Pain Location: R hip  Pain Descriptors / Indicators: Tightness Pain Intervention(s): Monitored during session    Home Living                      Prior Function            PT Goals (current goals can now be found in the care plan section) Acute Rehab PT Goals Patient Stated Goal: to get dressed and go home Potential  to Achieve Goals: Good Progress towards PT goals: Progressing toward goals    Frequency    7X/week      PT Plan Current plan remains appropriate    Co-evaluation              AM-PAC PT "6 Clicks" Mobility   Outcome Measure  Help needed turning from your back to your side while in a flat bed without using bedrails?: None Help needed moving from lying on your back to sitting on the side of a flat bed without using bedrails?: None Help needed moving to and from a bed to a chair (including a wheelchair)?: None Help needed standing up from a chair using your arms (e.g., wheelchair or bedside chair)?: None Help needed to walk in hospital room?: None Help  needed climbing 3-5 steps with a railing? : A Little 6 Click Score: 23    End of Session Equipment Utilized During Treatment: Gait belt Activity Tolerance: Patient tolerated treatment well Patient left: with call bell/phone within reach;in chair;with chair alarm set Nurse Communication: Mobility status PT Visit Diagnosis: Other abnormalities of gait and mobility (R26.89);Pain Pain - Right/Left: Right Pain - part of body: Hip     Time: 2423-5361 PT Time Calculation (min) (ACUTE ONLY): 21 min  Charges:  $Gait Training: 8-22 mins                       Lillia Pauls, PT, DPT Acute Rehabilitation Services Pager [phone] Office [phone]    Norval Morton 09/11/2019, 10:21 AM

## 2019-09-12 ENCOUNTER — Telehealth: Payer: Self-pay | Admitting: Orthopaedic Surgery

## 2019-09-12 NOTE — Telephone Encounter (Signed)
Patient called. She had concerns about her physical therapy. Says she hasn't heard anything from anyone about starting PT. Her call back number is [phone]

## 2019-09-12 NOTE — Telephone Encounter (Signed)
Spoke with Dorene Sorrow at [facility] and he states he will call and speak with patient and be out there tomorrow

## 2019-09-13 NOTE — Discharge Summary (Signed)
Patient ID: Hannah Yu MRN: [id number] DOB/AGE: 75/10/1945 75 y.o.  Admit date: 09/06/2019 Discharge date: 09/13/2019  Admission Diagnoses:  Principal Problem:   Primary osteoarthritis of right hip Active Problems:   Status post total replacement of right hip   Discharge Diagnoses:  Status post right total hip arthroplasty Acute respiratory failure with hypoxia   Past Medical History:  Diagnosis Date  . Acid reflux   . Anesthesia complication    Tachycardia previously, now on metoprolol  . Arthritis    09/02/2019: per patient "have it real bad in both hands and back"  . Hypertension   . Sciatica    09/02/2019: has had it for about 3 years    Surgeries: Procedure(s): RIGHT TOTAL HIP ARTHROPLASTY ANTERIOR APPROACH on 09/06/2019   Consultants: Treatment Team:  Kathlen ModyAkula, Vijaya, MD  Discharged Condition: Improved  Hospital Course: Hannah FieldMargaret T Yu is an 74 y.o. female who was admitted 09/06/2019 for operative treatment ofPrimary osteoarthritis of right hip. Patient has severe unremitting pain that affects sleep, daily activities, and work/hobbies. After pre-op clearance the patient was taken to the operating room on 09/06/2019 and underwent  Procedure(s): RIGHT TOTAL HIP ARTHROPLASTY ANTERIOR APPROACH.    Patient was given perioperative antibiotics:  Anti-infectives (From admission, onward)   Start     Dose/Rate Route Frequency Ordered Stop   09/06/19 1900  ceFAZolin (ANCEF) IVPB 1 g/50 mL premix     1 g 100 mL/hr over 30 Minutes Intravenous Every 6 hours 09/06/19 1558 09/07/19 0112   09/06/19 1016  ceFAZolin (ANCEF) 2-4 GM/100ML-% IVPB    Note to Pharmacy: Marcelle SmilingMelville, Allison Ch: cabinet override      09/06/19 1016 09/06/19 1312   09/06/19 1000  ceFAZolin (ANCEF) IVPB 2g/100 mL premix     2 g 200 mL/hr over 30 Minutes Intravenous On call to O.R. 09/06/19 0959 09/06/19 1326       Patient was given sequential compression devices, early ambulation, and chemoprophylaxis to  prevent DVT.  Patient benefited maximally from hospital stay. Developed acute respiratory failure with hypoxia of unknown etiology which was worked up by medicine. However acute respiratory failure felt to be due to post op sedation vs obstructive lung disease vs secondary to post op anemia. Patient refused CT scan to rule out DVT. VQ scan showed low probability for PE. EKG showed PVC's with sinus arrhythmias.   Echocardiogram no significant abnormalties EF 55-60%. Patient was stable at time of discharge to home on 2 liters of oxygen and to follow up with out patient pulmonology.   Recent vital signs: No data found.   Recent laboratory studies:  Recent Labs    09/10/19 1627  WBC 10.1  HGB 9.5*  HCT 30.7*  PLT 363  NA 133*  K 3.2*  CL 94*  CO2 28  BUN 10  CREATININE 0.74  GLUCOSE 128*  CALCIUM 8.5*     Discharge Medications:   Allergies as of 09/11/2019      Reactions   Codeine Diarrhea   Cortisone Swelling   Lovastatin Other (See Comments)   Muscle cramps      Medication List    STOP taking these medications   aspirin 81 MG EC tablet Replaced by: aspirin 81 MG chewable tablet   traMADol 50 MG tablet Commonly known as: ULTRAM     TAKE these medications   amLODipine 10 MG tablet Commonly known as: NORVASC Take 10 mg by mouth daily.   aspirin 81 MG chewable tablet Chew 1 tablet (81  mg total) by mouth 2 (two) times daily. Replaces: aspirin 81 MG EC tablet   calcium carbonate 1250 (500 Ca) MG tablet Commonly known as: OS-CAL - dosed in mg of elemental calcium Take 1 tablet by mouth daily with breakfast.   cyclobenzaprine 5 MG tablet Commonly known as: FLEXERIL Take 5 mg by mouth 3 (three) times daily as needed for muscle spasms.   FIBER PO Take 2 tablets by mouth daily.   furosemide 40 MG tablet Commonly known as: LASIX Take 40 mg by mouth daily.   HAIR/SKIN/NAILS PO Take 1 tablet by mouth daily.   ibuprofen 200 MG tablet Commonly known as: ADVIL Take  400 mg by mouth every 6 (six) hours as needed for mild pain or moderate pain.   losartan 100 MG tablet Commonly known as: COZAAR Take 100 mg by mouth daily.   metoprolol succinate 50 MG 24 hr tablet Commonly known as: TOPROL-XL Take 50 mg by mouth daily.   omeprazole 20 MG capsule Commonly known as: PRILOSEC Take 20 mg by mouth every other day.   OVER THE COUNTER MEDICATION Take 2 tablets by mouth daily. Neurop Away   OVER THE COUNTER MEDICATION Take 30 mg by mouth daily. CBD gummie   oxyCODONE 5 MG immediate release tablet Commonly known as: Oxy IR/ROXICODONE Take 1-2 tablets (5-10 mg total) by mouth every 4 (four) hours as needed for moderate pain (pain score 4-6).   STOOL SOFTENER PO Take 1 tablet by mouth daily.   VITAMIN B 12 PO Take 5,000 mcg by mouth every morning.       Diagnostic Studies: DG Pelvis Portable  Result Date: 09/06/2019 CLINICAL DATA:  Total right hip replacement. EXAM: PORTABLE PELVIS 1-2 VIEWS COMPARISON:  No prior. FINDINGS: Total right hip replacement. Hardware intact. Anatomic alignment. Peripheral vascular calcification. IMPRESSION: 1. Total right hip replacement with anatomic alignment. No acute bony abnormality. 2.  Peripheral vascular disease. Electronically Signed   By: Marcello Moores  Register   On: 09/06/2019 15:14   NM Pulmonary Perf and Vent  Result Date: 09/08/2019 CLINICAL DATA:  Oxygen desaturation. EXAM: NUCLEAR MEDICINE VENTILATION - PERFUSION LUNG SCAN TECHNIQUE: Ventilation images were obtained in multiple projections using inhaled aerosol Tc-40m DTPA. Perfusion images were obtained in multiple projections after intravenous injection of Tc-7m MAA. RADIOPHARMACEUTICALS:  1.59 mCi of Tc-25m DTPA aerosol inhalation and 40.6 mCi Tc69m MAA IV COMPARISON:  Chest x-ray dated May 6 21 FINDINGS: Ventilation: There is a defect in ventilation in the anterior left upper lobe. There is some mild clumping of radiotracer in the trachea and central airways.  Perfusion: There is a mass defect within the anterior left upper lobe on the perfusion images. No mismatched defect identified on this study. IMPRESSION: 1. Low probability for pulmonary embolism. 2. Mild central distribution of radiotracer on the ventilation portion of the exam may indicate some degree of underlying obstructive airway disease. Electronically Signed   By: Constance Holster M.D.   On: 09/08/2019 19:52   DG Chest Port 1V same Day  Result Date: 09/08/2019 CLINICAL DATA:  Oxygen desaturation. EXAM: PORTABLE CHEST 1 VIEW COMPARISON:  CT abdomen 11/09/2015 FINDINGS: Linear densities in the mid and lower left chest. Few streaky densities in the medial right lung base. Upper lungs are clear. Negative for pneumothorax. Heart size is within normal limits. Bone structures are unremarkable. IMPRESSION: Streaky and linear densities in both lungs, left side greater than right. Findings are most compatible with atelectasis and possibly scarring. Electronically Signed   By: Quita Skye  Lowella Dandy M.D.   On: 09/08/2019 12:42   DG C-Arm 1-60 Min  Result Date: 09/06/2019 CLINICAL DATA:  Total hip replacement. EXAM: OPERATIVE right HIP (WITH PELVIS IF PERFORMED) 3 VIEWS TECHNIQUE: Fluoroscopic spot image(s) were submitted for interpretation post-operatively. COMPARISON:  07/20/2019 FINDINGS: Total right hip replacement. Hardware intact. Anatomic alignment. 0 minutes 22 seconds fluoroscopy time utilized. IMPRESSION: Total right hip replacement anatomic alignment. Electronically Signed   By: Maisie Fus  Register   On: 09/06/2019 14:38   ECHOCARDIOGRAM COMPLETE  Result Date: 09/09/2019    ECHOCARDIOGRAM REPORT   Patient Name:   NAYELY DINGUS Date of Exam: 09/09/2019 Medical Rec #:  [id number]           Height:       61.0 in Accession #:    [id number]          Weight:       173.0 lb Date of Birth:  October 27, 1945            BSA:          1.776 m Patient Age:    74 years            BP:           123/59 mmHg Patient Gender: F                    HR:           99 bpm. Exam Location:  Inpatient Procedure: 2D Echo, Color Doppler and Cardiac Doppler Indications:    R06.9 DOE  History:        Patient has no prior history of Echocardiogram examinations.                 Risk Factors:Hypertension and Dyslipidemia.  Sonographer:    Irving Burton Senior RDCS Referring Phys: [id number] [facility] General  Sonographer Comments: Technically difficult study due to poor echo windows, unable to reposition patient due to recent hip replacement. IMPRESSIONS  1. Left ventricular ejection fraction, by estimation, is 55 to 60%. The left ventricle has normal function. The left ventricle has no regional wall motion abnormalities. Left ventricular diastolic function could not be evaluated.  2. Right ventricular systolic function is normal. The right ventricular size is normal.  3. Left atrial size was mildly dilated.  4. The mitral valve is grossly normal. Trivial mitral valve regurgitation. No evidence of mitral stenosis.  5. The aortic valve was not well visualized. Aortic valve regurgitation is not visualized. No aortic stenosis is present.  6. The inferior vena cava is normal in size with greater than 50% respiratory variability, suggesting right atrial pressure of 3 mmHg. FINDINGS  Left Ventricle: Left ventricular ejection fraction, by estimation, is 55 to 60%. The left ventricle has normal function. The left ventricle has no regional wall motion abnormalities. The left ventricular internal cavity size was normal in size. There is  borderline left ventricular hypertrophy. Left ventricular diastolic function could not be evaluated due to nondiagnostic images. Left ventricular diastolic function could not be evaluated. Right Ventricle: The right ventricular size is normal. Right vetricular wall thickness was not assessed. Right ventricular systolic function is normal. Left Atrium: Left atrial size was mildly dilated. Right Atrium: Right atrial size was not well visualized.  Pericardium: There is no evidence of pericardial effusion. Presence of pericardial fat pad. Mitral Valve: The mitral valve is grossly normal. Trivial mitral valve regurgitation. No evidence of mitral valve stenosis. Tricuspid Valve: The tricuspid valve is grossly normal.  Tricuspid valve regurgitation is trivial. No evidence of tricuspid stenosis. Aortic Valve: The aortic valve was not well visualized. Aortic valve regurgitation is not visualized. No aortic stenosis is present. Pulmonic Valve: The pulmonic valve was not well visualized. Pulmonic valve regurgitation is not visualized. Aorta: The aortic root, ascending aorta and aortic arch are all structurally normal, with no evidence of dilitation or obstruction. Venous: The inferior vena cava is normal in size with greater than 50% respiratory variability, suggesting right atrial pressure of 3 mmHg. IAS/Shunts: The interatrial septum was not well visualized.  LEFT VENTRICLE PLAX 2D LVIDd:         3.50 cm LVIDs:         2.60 cm LV PW:         1.10 cm LV IVS:        1.30 cm LVOT diam:     2.00 cm LV SV:         56 LV SV Index:   32 LVOT Area:     3.14 cm  RIGHT VENTRICLE RV S prime:     15.50 cm/s TAPSE (M-mode): 1.4 cm LEFT ATRIUM             Index       RIGHT ATRIUM          Index LA diam:        4.10 cm 2.31 cm/m  RA Area:     9.97 cm LA Vol (A2C):   56.2 ml 31.65 ml/m RA Volume:   20.70 ml 11.66 ml/m LA Vol (A4C):   59.2 ml 33.34 ml/m LA Biplane Vol: 57.9 ml 32.60 ml/m  AORTIC VALVE LVOT Vmax:   96.47 cm/s LVOT Vmean:  67.033 cm/s LVOT VTI:    0.178 m  AORTA Ao Root diam: 3.30 cm Ao Asc diam:  3.50 cm  SHUNTS Systemic VTI:  0.18 m Systemic Diam: 2.00 cm Jodelle Red MD Electronically signed by Jodelle Red MD Signature Date/Time: 09/09/2019/1:47:01 PM    Final    DG HIP OPERATIVE UNILAT WITH PELVIS RIGHT  Result Date: 09/06/2019 CLINICAL DATA:  Total hip replacement. EXAM: OPERATIVE right HIP (WITH PELVIS IF PERFORMED) 3 VIEWS TECHNIQUE:  Fluoroscopic spot image(s) were submitted for interpretation post-operatively. COMPARISON:  07/20/2019 FINDINGS: Total right hip replacement. Hardware intact. Anatomic alignment. 0 minutes 22 seconds fluoroscopy time utilized. IMPRESSION: Total right hip replacement anatomic alignment. Electronically Signed   By: Maisie Fus  Register   On: 09/06/2019 14:38    Disposition: Discharge disposition: 01-Home or Self Care       Discharge Instructions    Ambulatory referral to Pulmonology   Complete by: As directed    Needs Pulmonary function tests.   Reason for referral: Asthma/COPD   Call MD / Call 911   Complete by: As directed    If you experience chest pain or shortness of breath, CALL 911 and be transported to the hospital emergency room.  If you develope a fever above 101 F, pus (white drainage) or increased drainage or redness at the wound, or calf pain, call your surgeon's office.   Constipation Prevention   Complete by: As directed    Drink plenty of fluids.  Prune juice may be helpful.  You may use a stool softener, such as Colace (over the counter) 100 mg twice a day.  Use MiraLax (over the counter) for constipation as needed.   Diet - low sodium heart healthy   Complete by: As directed    Discharge instructions  Complete by: As directed    INSTRUCTIONS AFTER JOINT REPLACEMENT   Remove items at home which could result in a fall. This includes throw rugs or furniture in walking pathways ICE to the affected joint every three hours while awake for 30 minutes at a time, for at least the first 3-5 days, and then as needed for pain and swelling.  Continue to use ice for pain and swelling. You may notice swelling that will progress down to the foot and ankle.  This is normal after surgery.  Elevate your leg when you are not up walking on it.   Continue to use the breathing machine you got in the hospital (incentive spirometer) which will help keep your temperature down.  It is common for your  temperature to cycle up and down following surgery, especially at night when you are not up moving around and exerting yourself.  The breathing machine keeps your lungs expanded and your temperature down.   DIET:  As you were doing prior to hospitalization, we recommend a well-balanced diet.  DRESSING / WOUND CARE / SHOWERING  Keep the surgical dressing until follow up.  The dressing is water proof, so you can shower without any extra covering.  IF THE DRESSING FALLS OFF or the wound gets wet inside, change the dressing with sterile gauze.  Please use good hand washing techniques before changing the dressing.  Do not use any lotions or creams on the incision until instructed by your surgeon.    ACTIVITY  Increase activity slowly as tolerated, but follow the weight bearing instructions below.   No driving for 6 weeks or until further direction given by your physician.  You cannot drive while taking narcotics.  No lifting or carrying greater than 10 lbs. until further directed by your surgeon. Avoid periods of inactivity such as sitting longer than an hour when not asleep. This helps prevent blood clots.  You may return to work once you are authorized by your doctor.     WEIGHT BEARING   Weight bearing as tolerated with assist device (walker, cane, etc) as directed, use it as long as suggested by your surgeon or therapist, typically at least 4-6 weeks.   EXERCISES  Results after joint replacement surgery are often greatly improved when you follow the exercise, range of motion and muscle strengthening exercises prescribed by your doctor. Safety measures are also important to protect the joint from further injury. Any time any of these exercises cause you to have increased pain or swelling, decrease what you are doing until you are comfortable again and then slowly increase them. If you have problems or questions, call your caregiver or physical therapist for advice.   Rehabilitation is  important following a joint replacement. After just a few days of immobilization, the muscles of the leg can become weakened and shrink (atrophy).  These exercises are designed to build up the tone and strength of the thigh and leg muscles and to improve motion. Often times heat used for twenty to thirty minutes before working out will loosen up your tissues and help with improving the range of motion but do not use heat for the first two weeks following surgery (sometimes heat can increase post-operative swelling).   These exercises can be done on a training (exercise) mat, on the floor, on a table or on a bed. Use whatever works the best and is most comfortable for you.    Use music or television while you are exercising so that  the exercises are a pleasant break in your day. This will make your life better with the exercises acting as a break in your routine that you can look forward to.   Perform all exercises about fifteen times, three times per day or as directed.  You should exercise both the operative leg and the other leg as well.  Exercises include:   Quad Sets - Tighten up the muscle on the front of the thigh (Quad) and hold for 5-10 seconds.   Straight Leg Raises - With your knee straight (if you were given a brace, keep it on), lift the leg to 60 degrees, hold for 3 seconds, and slowly lower the leg.  Perform this exercise against resistance later as your leg gets stronger.  Leg Slides: Lying on your back, slowly slide your foot toward your buttocks, bending your knee up off the floor (only go as far as is comfortable). Then slowly slide your foot back down until your leg is flat on the floor again.  Angel Wings: Lying on your back spread your legs to the side as far apart as you can without causing discomfort.  Hamstring Strength:  Lying on your back, push your heel against the floor with your leg straight by tightening up the muscles of your buttocks.  Repeat, but this time bend your knee to  a comfortable angle, and push your heel against the floor.  You may put a pillow under the heel to make it more comfortable if necessary.   A rehabilitation program following joint replacement surgery can speed recovery and prevent re-injury in the future due to weakened muscles. Contact your doctor or a physical therapist for more information on knee rehabilitation.    CONSTIPATION  Constipation is defined medically as fewer than three stools per week and severe constipation as less than one stool per week.  Even if you have a regular bowel pattern at home, your normal regimen is likely to be disrupted due to multiple reasons following surgery.  Combination of anesthesia, postoperative narcotics, change in appetite and fluid intake all can affect your bowels.   YOU MUST use at least one of the following options; they are listed in order of increasing strength to get the job done.  They are all available over the counter, and you may need to use some, POSSIBLY even all of these options:    Drink plenty of fluids (prune juice may be helpful) and high fiber foods Colace 100 mg by mouth twice a day  Senokot for constipation as directed and as needed Dulcolax (bisacodyl), take with full glass of water  Miralax (polyethylene glycol) once or twice a day as needed.  If you have tried all these things and are unable to have a bowel movement in the first 3-4 days after surgery call either your surgeon or your primary doctor.    If you experience loose stools or diarrhea, hold the medications until you stool forms back up.  If your symptoms do not get better within 1 week or if they get worse, check with your doctor.  If you experience "the worst abdominal pain ever" or develop nausea or vomiting, please contact the office immediately for further recommendations for treatment.   ITCHING:  If you experience itching with your medications, try taking only a single pain pill, or even half a pain pill at a  time.  You can also use Benadryl over the counter for itching or also to help with sleep.  TED HOSE STOCKINGS:  Use stockings on both legs until for at least 2 weeks or as directed by physician office. They may be removed at night for sleeping.  MEDICATIONS:  See your medication summary on the "After Visit Summary" that nursing will review with you.  You may have some home medications which will be placed on hold until you complete the course of blood thinner medication.  It is important for you to complete the blood thinner medication as prescribed.  PRECAUTIONS:  If you experience chest pain or shortness of breath - call 911 immediately for transfer to the hospital emergency department.   If you develop a fever greater that 101 F, purulent drainage from wound, increased redness or drainage from wound, foul odor from the wound/dressing, or calf pain - CONTACT YOUR SURGEON.                                                   FOLLOW-UP APPOINTMENTS:  If you do not already have a post-op appointment, please call the office for an appointment to be seen by your surgeon.  Guidelines for how soon to be seen are listed in your "After Visit Summary", but are typically between 1-4 weeks after surgery.  OTHER INSTRUCTIONS:   Knee Replacement:  Do not place pillow under knee, focus on keeping the knee straight while resting. CPM instructions: 0-90 degrees, 2 hours in the morning, 2 hours in the afternoon, and 2 hours in the evening. Place foam block, curve side up under heel at all times except when in CPM or when walking.  DO NOT modify, tear, cut, or change the foam block in any way.   DENTAL ANTIBIOTICS:  In most cases prophylactic antibiotics for Dental procdeures after total joint surgery are not necessary.  Exceptions are as follows:  1. History of prior total joint infection  2. Severely immunocompromised (Organ Transplant, cancer chemotherapy, Rheumatoid biologic meds such as Humera)  3.  Poorly controlled diabetes (A1C &gt; 8.0, blood glucose over 200)  If you have one of these conditions, contact your surgeon for an antibiotic prescription, prior to your dental procedure.   MAKE SURE YOU:  Understand these instructions.  Get help right away if you are not doing well or get worse.    Thank you for letting us be a part of your medical care team.  It is a privilege we respect greatly.  We hope these instructions will help you stay on track for a fast and full recovery!  Dental Antibiotics:  In most cases prophylactic antibiotics for Dental procdeures after total joint surgery are not necessary.  Exceptions are as follows:  1. History of prior total joint infection  2. Severely immunocompromised (Organ Transplant, cancer chemotherapy, Rheumatoid biologic meds such as Humera)  3. Poorly controlled diabetes (A1C &gt; 8.0, blood glucose over 200)  If you have one of these conditions, contact your surgeon for an antibiotic prescription, prior to your dental procedure. It is recommend that you use post exercise oxygen 2 liters per nasal cannula for 15-20 minutes post exercise. Check your heart rate if it remains Elevated above 100 stay on the oxygen till it is under 100.  Internal medicine has  Recommended that you make an appointment to see a Pulmonary (lung) specialist with [facility] in [place], this in follow up to the concern  of recurring hypoxia with exercise found during your hospital stay.  Remain on aspirin, baby aspirin twice a day by mouth.  [facility] will [facility] but if you are experiencing pain and you need another prescription sent to a local pharmacy that is open on Sunday please call [facility] at [phone] or the answerring service at [phone]   Driving restrictions   Complete by: As directed    No driving for 6 weeks   Increase activity slowly as tolerated   Complete by: As directed    Lifting restrictions    Complete by: As directed    No lifting for 6 weeks      Follow-up Information    Kathryne Hitch, MD Follow up in 2 week(s).   Specialty: Orthopedic Surgery Contact information: [address] [place] [address] [phone]        [facility] Follow up.   Why: [facility] will provide you with a rolling walker and 3:1 toilet seat/ shower chair prior to your discharge home. Contact information: [address] [place] [address] [phone]        [facility] Follow up.   Specialty: Home Health Services Why: [facility] will provide you with home health physical therapy within 24-48 hours of your discharge home. Contact information: [address] [place] [address] [phone]        Jerl Mina, MD. Schedule an appointment as soon as possible for a visit in 1 week(s).   Specialty: Family Medicine Contact information: [address] [place] [address] [phone]            Signed: Richardean Canal 09/13/2019, 2:04 PM

## 2019-09-21 ENCOUNTER — Encounter: Payer: Self-pay | Admitting: Physician Assistant

## 2019-09-21 ENCOUNTER — Ambulatory Visit (INDEPENDENT_AMBULATORY_CARE_PROVIDER_SITE_OTHER): Payer: Medicare Other | Admitting: Physician Assistant

## 2019-09-21 ENCOUNTER — Other Ambulatory Visit: Payer: Self-pay

## 2019-09-21 DIAGNOSIS — Z96641 Presence of right artificial hip joint: Secondary | ICD-10-CM

## 2019-09-21 MED ORDER — TRAMADOL HCL 50 MG PO TABS: 50.0000 mg | ORAL_TABLET | Freq: Four times a day (QID) | ORAL | 0 refills | Status: DC | PRN

## 2019-09-21 NOTE — Progress Notes (Signed)
UDJ:SHFWYOVZ returns now 2 weeks status post right total hip arthroplasty.  She is ambulating with a cane.  She states that she has had no problems with shortness of breath and does not use oxygen at home.  Again postoperatively she was found to have acute respiratory failure with hypoxia.  She states she is doing well other than she wants to work further with physical therapy on gait and balance.  No fevers chills shortness of breath chest pain  Review of systems: Please see HPI otherwise negative  Physical exam: Right hip surgical incisions well approximated staples no signs of infection.  Slight seroma.  Good range of motion right hip.  Walks with a slight antalgic gait with use cane.  Is able to walk about the room without the cane.  Gets on and off the exam table easily.  Impression: Status post right total hip arthroplasty  Plan: Right hip prepped with Betadine and then 50 cc of serosanguineous fluid was aspirated patient tolerates well.  Staples removed Steri-Strips applied.  She will work on scar tissue mobilization.  We will send her outpatient physical therapy to work on gait and balance.  Refill her tramadol.  She is cautioned about driving on tramadol or any other narcotics.  Follow-up with [place] in 1 month sooner if there is any questions concerns.

## 2019-09-21 NOTE — Addendum Note (Signed)
Addended by: Mardene Celeste B on: 09/21/2019 04:27 PM   Modules accepted: Orders

## 2019-09-27 ENCOUNTER — Ambulatory Visit: Payer: Medicare Other | Attending: Physician Assistant

## 2019-09-27 ENCOUNTER — Other Ambulatory Visit: Payer: Self-pay

## 2019-09-27 DIAGNOSIS — M6281 Muscle weakness (generalized): Secondary | ICD-10-CM

## 2019-09-27 DIAGNOSIS — R262 Difficulty in walking, not elsewhere classified: Secondary | ICD-10-CM | POA: Diagnosis present

## 2019-09-27 DIAGNOSIS — M25551 Pain in right hip: Secondary | ICD-10-CM | POA: Diagnosis not present

## 2019-09-27 NOTE — Therapy (Signed)
[name] [address], [place], [address] Phone: [phone]   Fax:  [phone]  Physical Therapy Evaluation  Patient Details  Name: Hannah Yu MRN: [id number] Date of Birth: 01/03/1946 Referring Provider (PT): Erskine Emery PA-C   Encounter Date: 09/27/2019  PT End of Session - 09/27/19 1346    Visit Number  1    Number of Visits  13    Date for PT Re-Evaluation  11/10/19    Authorization Type  1    Authorization Time Period  of 10 progress report    PT Start Time  1346    PT Stop Time  1435    PT Time Calculation (min)  49 min    Equipment Utilized During Treatment  Gait belt    Activity Tolerance  Patient tolerated treatment well    Behavior During Therapy  [facility] for tasks assessed/performed       Past Medical History:  Diagnosis Date  . Acid reflux   . Anesthesia complication    Tachycardia previously, now on metoprolol  . Arthritis    09/02/2019: per patient "have it real bad in both hands and back"  . Hypertension   . Sciatica    09/02/2019: has had it for about 3 years    Past Surgical History:  Procedure Laterality Date  . ABDOMINAL HYSTERECTOMY    . CATARACT EXTRACTION W/ INTRAOCULAR LENS IMPLANT Bilateral 2007   eyes done within 1 month of each other.  Marland Kitchen SHOULDER ARTHROSCOPY WITH OPEN ROTATOR CUFF REPAIR Right 2012  . TOTAL HIP ARTHROPLASTY Right 09/06/2019  . TOTAL HIP ARTHROPLASTY Right 09/06/2019   Procedure: RIGHT TOTAL HIP ARTHROPLASTY ANTERIOR APPROACH;  Surgeon: Mcarthur Rossetti, MD;  Location: [facility];  Service: Orthopedics;  Laterality: Right;    There were no vitals filed for this visit.   Subjective Assessment - 09/27/19 1349    Subjective  R hip: 0/10 currently (pt stopped taking taking pain medication), 3/10 R hip pain at most for the past 2 weeks, usually at night when in bed, pt sleeps on her R side, has not placed pillow between knees, firm mattres).     Pertinent History  S/P R THA on 09/06/2019.  Pt states surgery doing well. Had home health PT until last Friday 09/23/19. Stitches removed last Wednesday. Wanted to continue PT to work on balance and gait. Pt also has a hx of R sciatica. Wants to concentrate on balance and being able to walk without the cane. No falls within the last 6 months, no fear of falling. HEP from home health PT includes tandem stance, balance, side stepping with UE support.    Patient Stated Goals  improve balance and walk independently    Currently in Pain?  No/denies    Pain Score  0-No pain    Pain Onset  1 to 4 weeks ago    Pain Frequency  Rarely    Aggravating Factors   Lying on her R side.         [facility] PT Assessment - 09/27/19 1344      Assessment   Medical Diagnosis  S/P R THA    Referring Provider (PT)  Erskine Emery PA-C    Onset Date/Surgical Date  09/06/19    Hand Dominance  Right      Precautions   Precaution Comments  anterior hip precautions      Restrictions   Other Position/Activity Restrictions  No known  weight bearing restrictions      Balance Screen   Has the patient fallen in the past 6 months  No    Has the patient had a decrease in activity level because of a fear of falling?   No    Is the patient reluctant to leave their home because of a fear of falling?   No      Home Environment   Living Environment  --    Additional Comments  14 steps, R rail      Prior Function   Vocation  Retired    [employer] Requirements  PLOF: gait with SPC due to R hip pain and R LE sciatica      Observation/Other Assessments   Observations  Incision healing well. Steri strips     Focus on Therapeutic Outcomes (FOTO)   Hip FOTO: 55      Posture/Postural Control   Posture Comments  protracted neck, slight R lateral shift, L shoulder lower, R greater trochanter and R knee slightly higher, L hip in slight  ER      Strength   Right Hip Flexion  3-/5   unable to go full range; 4/5 at available range    Right Hip Extension  4+/5   seated manually resisted   Right Hip ABduction  4/5    Left Hip Flexion  4/5    Left Hip Extension  4-/5   seated manually resisted   Right Knee Flexion  4/5    Right Knee Extension  5/5    Left Knee Flexion  4+/5    Left Knee Extension  5/5      Ambulation/Gait   Gait Comments  Ambulated with SPC on L, decreased stance R LE with R lateral lean. Decreased hip extension.       Dynamic Gait Index   Level Surface  Mild Impairment    Change in Gait Speed  Mild Impairment    Gait with Horizontal Head Turns  Mild Impairment    Gait with Vertical Head Turns  Mild Impairment    Gait and Pivot Turn  Mild Impairment    Step Over Obstacle  Mild Impairment    Step Around Obstacles  Mild Impairment    Steps  Moderate Impairment    Total Score  15    DGI comment:  < or = 19 suggests increased risk for falls                  Objective measurements completed on examination: See above findings.   Pt states that she has been using her SPC for the past 2 years secondary to R LE sciatica and R hip pain,   No latex band allergy Blood pressure controlled per pt.       10 meter walk   With SPC 9.96 seconds, 9.07 seconds (1.05 m/second)  LTG: walk without SPC at 1 m/second    Therapeutic exercise  S/L hip abduction  R 10x2  supine glute max and quad set 10x2 with 5 second holds  Supine mini bridge 10x2   Improved exercise technique, movement at target joints, use of target muscles after mod verbal, visual, tactile cues.   Response to treatment Pt tolerated session well without aggravation of symptoms    Clinical impression Pt is a 74 year old female who came to physical therapy S/P R total hip arthroplasty on 09/06/2019. She is currently 3 weeks post op. She also presents with altered posture, R hip weakness,  slight antalgic gait pattern with lateral lean, decreased balance, and difficulty with standing tasks such as gait without AD and stair  negotiation. Pt will benefit from skilled physical therapy services to address the aforementioned deficits.            PT Education - 09/27/19 1504    Education provided  Yes    Education Details  ther-ex, plan of care    Person(s) Educated  Patient    Methods  Explanation;Demonstration;Tactile cues;Verbal cues    Comprehension  Returned demonstration;Verbalized understanding       PT Short Term Goals - 09/27/19 1451      PT SHORT TERM GOAL #1   Title  Patient will be independent with her HEP to improve strength, balance, and promote ability to ambulate without AD.    Time  3    Period  Weeks    Status  New    Target Date  10/20/19        PT Long Term Goals - 09/27/19 1452      PT LONG TERM GOAL #1   Title  Patient will be able to ambulate at least 500 ft without AD and no LOB to promote mobility.    Baseline  Currently uses an SPC to ambulate (09/27/2019)    Time  6    Period  Weeks    Status  New    Target Date  11/10/19      PT LONG TERM GOAL #2   Title  Patient will improve bilateral hip strength to promote ability to ambulate without AD.    Time  6    Period  Weeks    Status  New    Target Date  11/10/19      PT LONG TERM GOAL #3   Title  Patient will improve her hip FOTO by at least 10 points as a demonstration of improved function.    Baseline  Hip FOTO: 55 (09/27/2019)    Time  6    Period  Weeks    Status  New    Target Date  11/10/19             Plan - 09/27/19 1444    Clinical Impression Statement  Pt is a 74 year old female who came to physical therapy S/P R total hip arthroplasty on 09/06/2019. She is currently 3 weeks post op. She also presents with altered posture, R hip weakness, slight antalgic gait pattern with lateral lean, decreased balance, and difficulty with standing tasks such as gait without AD and stair negotiation. Pt will benefit from skilled physical therapy services to address the aforementioned deficits.    Personal Factors  and Comorbidities  Age;Comorbidity 3+    Comorbidities  HTN, arthritis, sciatica    Examination-Activity Limitations  Stairs    Stability/Clinical Decision Making  Stable/Uncomplicated    Clinical Decision Making  Low    Clinical Presentation due to:  improved R hip comfort level since surgery.    Rehab Potential  Good    PT Frequency  2x / week    PT Duration  6 weeks    PT Treatment/Interventions  Therapeutic activities;Therapeutic exercise;Balance training;Neuromuscular re-education;Patient/family education;Manual techniques;Dry needling;Aquatic Therapy;Electrical Stimulation;Iontophoresis 4mg /ml Dexamethasone;Gait training;Stair training    PT Next Visit Plan  trunk, hip strengthening, balance, gait, manual techniques, modalities PRN    Consulted and Agree with Plan of Care  Patient       Patient will benefit from skilled therapeutic intervention in order  to improve the following deficits and impairments:  Improper body mechanics, Decreased strength, Difficulty walking, Abnormal gait  Visit Diagnosis: Pain in right hip - Plan: PT plan of care cert/re-cert  Muscle weakness (generalized) - Plan: PT plan of care cert/re-cert  Difficulty in walking, not elsewhere classified - Plan: PT plan of care cert/re-cert     Problem List Patient Active Problem List   Diagnosis Date Noted  . 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 Pelvic pain in female 07/20/2019  . Hyperlipidemia 07/20/2019  . B12 deficiency 07/20/2019  . Neuropathic pain of left hand 06/23/2017  . Spinal stenosis of lumbosacral region 11/11/2016  . Primary osteoarthritis of right hip 11/11/2016  . Obesity (BMI 30.0-34.9) 09/12/2016  . H/O supraventricular tachycardia 01/14/2016  . Hypertension 12/24/2015  . Back pain with right-sided sciatica 12/24/2015  . Gallstone 10/19/2015  . Biliary colic 10/19/2015  . Diverticulitis 06/28/2014    Loralyn Freshwater PT, DPT   09/27/2019, 3:09 PM  [facility] [address], [place], [address] Phone: [phone]   Fax:  [phone]  Name: Hannah Yu MRN: [id number] Date of Birth: 1946/02/17

## 2019-10-04 ENCOUNTER — Ambulatory Visit: Payer: Medicare Other | Attending: Physician Assistant

## 2019-10-04 ENCOUNTER — Other Ambulatory Visit: Payer: Self-pay

## 2019-10-04 DIAGNOSIS — M25551 Pain in right hip: Secondary | ICD-10-CM | POA: Insufficient documentation

## 2019-10-04 DIAGNOSIS — R262 Difficulty in walking, not elsewhere classified: Secondary | ICD-10-CM | POA: Insufficient documentation

## 2019-10-04 DIAGNOSIS — M6281 Muscle weakness (generalized): Secondary | ICD-10-CM | POA: Insufficient documentation

## 2019-10-04 NOTE — Therapy (Signed)
[facility] [address] [place], [address] Phone: [phone]   Fax:  [phone]  Physical Therapy Treatment  Patient Details  Name: Hannah Yu MRN: [id number] Date of Birth: Sep 19, 1945 Referring Provider (PT): Richardean Canal PA-C   Encounter Date: 10/04/2019  PT End of Session - 10/04/19 1519    Visit Number  2    Number of Visits  13    Date for PT Re-Evaluation  11/10/19    Authorization Type  2    Authorization Time Period  of 10 progress report    PT Start Time  1519    PT Stop Time  1600    PT Time Calculation (min)  41 min    Equipment Utilized During Treatment  Gait belt    Activity Tolerance  Patient tolerated treatment well    Behavior During Therapy  [facility] for tasks assessed/performed       Past Medical History:  Diagnosis Date  . Acid reflux   . Anesthesia complication    Tachycardia previously, now on metoprolol  . Arthritis    09/02/2019: per patient "have it real bad in both hands and back"  . Hypertension   . Sciatica    09/02/2019: has had it for about 3 years    Past Surgical History:  Procedure Laterality Date  . ABDOMINAL HYSTERECTOMY    . CATARACT EXTRACTION W/ INTRAOCULAR LENS IMPLANT Bilateral 2007   eyes done within 1 month of each other.  Marland Kitchen SHOULDER ARTHROSCOPY WITH OPEN ROTATOR CUFF REPAIR Right 2012  . TOTAL HIP ARTHROPLASTY Right 09/06/2019  . TOTAL HIP ARTHROPLASTY Right 09/06/2019   Procedure: RIGHT TOTAL HIP ARTHROPLASTY ANTERIOR APPROACH;  Surgeon: Kathryne Hitch, MD;  Location: [facility];  Service: Orthopedics;  Laterality: Right;    There were no vitals filed for this visit.  Subjective Assessment - 10/04/19 1521    Subjective  The epidural for the R hip surgery seems to have helped with the sciatica. No R hip pain currently. Almost went out without her SPC.    Pertinent History  S/P R THA on 09/06/2019.  Pt states surgery doing well. Had home health  PT until last Friday 09/23/19. Stitches removed last Wednesday. Wanted to continue PT to work on balance and gait. Pt also has a hx of R sciatica. Wants to concentrate on balance and being able to walk without the cane. No falls within the last 6 months, no fear of falling. HEP from home health PT includes tandem stance, balance, side stepping with UE support.    Patient Stated Goals  improve balance and walk independently    Currently in Pain?  No/denies    Pain Score  0-No pain    Pain Onset  1 to 4 weeks ago                                PT Education - 10/04/19 1534    Education provided  Yes    Education Details  ther-ex    [employer]) Educated  Patient    Methods  Explanation;Demonstration;Tactile cues;Verbal cues    Comprehension  Returned demonstration;Verbalized understanding      Objective  Pt states that she has been using her SPC for the past 2 years secondary to R LE sciatica and R hip pain,   No latex band allergy Blood pressure controlled per pt.  Medbridge Access Code [email or web address]  S/P 4 weeks   Therapeutic exercise  Standing mini squats 10x2 without UE assist   S/L hip abduction              R 10x2  supine glute max and quad set 10x3 with 5 second holds  Supine bridge to neutral hip extension only 10x3    Sit <> stand without UE assist 5x2 from low mat table/chair height  Able to perform  SLS    R LE 5x5 seconds with B UE light touch assist. Slight R anterior hip discomfort, disappears with rest   Step ups  Forward with L UE assist R 10x3  Lateral with B UE assist R 10x2. Therapeutic rest breaks secondary to fatigue.   gait without SPC 100 ft, cues for glute max muscle squeeze to decrease lateral lean. Able to ambulate safely 100 ft, limited by fatigue.    Standing hip abduction with B UE assist 10x each LE    Improved exercise technique, movement at target joints, use of target muscles after mod verbal, visual,  tactile cues.    Response to treatment Pt tolerated session well without aggravation of symptoms    Clinical impression Continued working on improving glute and quad strength to promote ability to ambulate and perform standing tasks with less difficulty. Pt tolerated session well without aggravation of symptoms. Pt will benefit from continued skilled physical therapy services to improve strength, balance, function, and decrease difficulty ambulating.     PT Short Term Goals - 09/27/19 1451      PT SHORT TERM GOAL #1   Title  Patient will be independent with her HEP to improve strength, balance, and promote ability to ambulate without AD.    Time  3    Period  Weeks    Status  New    Target Date  10/20/19        PT Long Term Goals - 09/27/19 1452      PT LONG TERM GOAL #1   Title  Patient will be able to ambulate at least 500 ft without AD and no LOB to promote mobility.    Baseline  Currently uses an SPC to ambulate (09/27/2019)    Time  6    Period  Weeks    Status  New    Target Date  11/10/19      PT LONG TERM GOAL #2   Title  Patient will improve bilateral hip strength to promote ability to ambulate without AD.    Time  6    Period  Weeks    Status  New    Target Date  11/10/19      PT LONG TERM GOAL #3   Title  Patient will improve her hip FOTO by at least 10 points as a demonstration of improved function.    Baseline  Hip FOTO: 55 (09/27/2019)    Time  6    Period  Weeks    Status  New    Target Date  11/10/19            Plan - 10/04/19 1547    Clinical Impression Statement  Continued working on improving glute and quad strength to promote ability to ambulate and perform standing tasks with less difficulty. Pt tolerated session well without aggravation of symptoms. Pt will benefit from continued skilled physical therapy services to improve strength, balance, function, and decrease difficulty ambulating.    Personal Factors and Comorbidities   Age;Comorbidity  3+    Comorbidities  HTN, arthritis, sciatica    Examination-Activity Limitations  Stairs    Stability/Clinical Decision Making  Stable/Uncomplicated    Rehab Potential  Good    PT Frequency  2x / week    PT Duration  6 weeks    PT Treatment/Interventions  Therapeutic activities;Therapeutic exercise;Balance training;Neuromuscular re-education;Patient/family education;Manual techniques;Dry needling;Aquatic Therapy;Electrical Stimulation;Iontophoresis 4mg /ml Dexamethasone;Gait training;Stair training    PT Next Visit Plan  trunk, hip strengthening, balance, gait, manual techniques, modalities PRN    Consulted and Agree with Plan of Care  Patient       Patient will benefit from skilled therapeutic intervention in order to improve the following deficits and impairments:  Improper body mechanics, Decreased strength, Difficulty walking, Abnormal gait  Visit Diagnosis: Pain in right hip  Muscle weakness (generalized)  Difficulty in walking, not elsewhere classified     Problem List Patient Active Problem List   Diagnosis Date Noted  . 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 Pelvic pain in female 07/20/2019  . Hyperlipidemia 07/20/2019  . B12 deficiency 07/20/2019  . Neuropathic pain of left hand 06/23/2017  . Spinal stenosis of lumbosacral region 11/11/2016  . Primary osteoarthritis of right hip 11/11/2016  . Obesity (BMI 30.0-34.9) 09/12/2016  . H/O supraventricular tachycardia 01/14/2016  . Hypertension 12/24/2015  . Back pain with right-sided sciatica 12/24/2015  . Gallstone 10/19/2015  . Biliary colic 21/19/4174  . Diverticulitis 06/28/2014    Joneen Boers PT, DPT   10/04/2019, 5:55 PM  [facility] [address] [place], [address] Phone: [phone]   Fax:  [phone]  Name: Hannah Yu MRN: [id number] Date of Birth: 07-08-1945

## 2019-10-04 NOTE — Patient Instructions (Signed)
Access Code: [email or web address] URL: [email or web address] Date: 10/04/2019 Prepared by: Loralyn Freshwater  Exercises Supine Bridge - 1 x daily - 7 x weekly - 3 sets - 10 reps Standing Hip Abduction with Counter Support - 1 x daily - 7 x weekly - 3 sets - 10 reps

## 2019-10-06 ENCOUNTER — Ambulatory Visit: Payer: Medicare Other

## 2019-10-10 ENCOUNTER — Ambulatory Visit: Payer: Medicare Other

## 2019-10-20 ENCOUNTER — Ambulatory Visit: Payer: Medicare Other

## 2019-10-20 ENCOUNTER — Other Ambulatory Visit: Payer: Self-pay

## 2019-10-20 DIAGNOSIS — M25551 Pain in right hip: Secondary | ICD-10-CM | POA: Diagnosis not present

## 2019-10-20 DIAGNOSIS — R262 Difficulty in walking, not elsewhere classified: Secondary | ICD-10-CM

## 2019-10-20 DIAGNOSIS — M6281 Muscle weakness (generalized): Secondary | ICD-10-CM

## 2019-10-20 NOTE — Patient Instructions (Signed)
Access Code: [email or web address] URL: [email or web address] Date: 10/20/2019 Prepared by: Loralyn Freshwater  Exercises Supine Bridge - 1 x daily - 7 x weekly - 3 sets - 10 reps Standing Hip Abduction with Counter Support - 1 x daily - 7 x weekly - 3 sets - 10 reps Sidelying Hip Abduction - 1 x daily - 7 x weekly - 2 sets - 5 -10 reps Standing Single Leg Stance with Counter Support - 1 x daily - 7 x weekly - 2-3 sets - 10 reps - 5 seconds hold

## 2019-10-20 NOTE — Therapy (Signed)
[facility] [address], [place], [address] Phone: [phone]   Fax:  [phone]  Physical Therapy Treatment  Patient Details  Name: Hannah Yu MRN: [id number] Date of Birth: 02-15-1946 Referring Provider (PT): Erskine Emery PA-C   Encounter Date: 10/20/2019   PT End of Session - 10/20/19 1736    Visit Number 3    Number of Visits 13    Date for PT Re-Evaluation 11/10/19    Authorization Type 3    Authorization Time Period of 10 progress report    PT Start Time 1736    PT Stop Time 1817    PT Time Calculation (min) 41 min    Equipment Utilized During Treatment Gait belt    Activity Tolerance Patient tolerated treatment well    Behavior During Therapy [facility] for tasks assessed/performed           Past Medical History:  Diagnosis Date  . Acid reflux   . Anesthesia complication    Tachycardia previously, now on metoprolol  . Arthritis    09/02/2019: per patient "have it real bad in both hands and back"  . Hypertension   . Sciatica    09/02/2019: has had it for about 3 years    Past Surgical History:  Procedure Laterality Date  . ABDOMINAL HYSTERECTOMY    . CATARACT EXTRACTION W/ INTRAOCULAR LENS IMPLANT Bilateral 2007   eyes done within 1 month of each other.  Marland Kitchen SHOULDER ARTHROSCOPY WITH OPEN ROTATOR CUFF REPAIR Right 2012  . TOTAL HIP ARTHROPLASTY Right 09/06/2019  . TOTAL HIP ARTHROPLASTY Right 09/06/2019   Procedure: RIGHT TOTAL HIP ARTHROPLASTY ANTERIOR APPROACH;  Surgeon: Mcarthur Rossetti, MD;  Location: [place];  Service: Orthopedics;  Laterality: Right;    There were no vitals filed for this visit.   Subjective Assessment - 10/20/19 1738    Subjective Could not sleep last night... there was a bird that got stuck at the [facility]. Currently able to walk around the house without the [facility] but still not walking normally. No R hip pain currently and when walking without the cane.     Pertinent History S/P R THA on 09/06/2019.  Pt states surgery doing well. Had home health PT until last Friday 09/23/19. Stitches removed last Wednesday. Wanted to continue PT to work on balance and gait. Pt also has a hx of R sciatica. Wants to concentrate on balance and being able to walk without the cane. No falls within the last 6 months, no fear of falling. HEP from home health PT includes tandem stance, balance, side stepping with UE support.    Patient Stated Goals improve balance and walk independently    Currently in Pain? No/denies    Pain Score 0-No pain    Pain Onset 1 to 4 weeks ago                                     PT Education - 10/20/19 1738    Education provided Yes    Education Details ther-ex    [employer]) Educated Patient    Methods Explanation;Demonstration;Tactile cues;Verbal cues    Comprehension Returned demonstration;Verbalized understanding          Objective  Pt states that she has been using her SPC for the past 2 years secondary to R LE sciatica and R hip pain,   No latex band  allergy Blood pressure controlled per pt.   email for exercises and FOTO: [email or web address]  Medbridge Access Code [email or web address]  S/P 7 weeks   Therapeutic exercise  Gait around gym no SPC L and R lateral lean 100 ft  S/L hip abduction  R 10x2  Supine bridge to neutral hip extension only 10x              SLS with B UE assist to promote glute med strength             R LE 10x5 seconds for 2 sets  L LE 10x5 seconds for 2 sets  Standing mini lunge static forward R LE with one UE assist 10x, then 6x. R knee joint pain afterwards. Eases with rest   supine glute max and quad set 10x with 5 second holds, then 10x10 second holds   Sit <> stand without UE assist 5x3 from low mat table/chair height             Able to perform  Step ups             Forward with L UE assist R 10x3             Lateral with B UE assist R 6x then 10x     Therapeutic rest breaks secondary to fatigue.       Improved exercise technique, movement at target joints, use of target muscles after mod verbal, visual, tactile cues.   Response to treatment Pt tolerated session well without aggravation of symptoms    Clinical impression Improving ability to ambulate with SPC without complain of pain. Still demonstrates lateral lean during gait. Continued working on glute med and max and general LE strengthening to decrease gait compensation. Pt tolerated session well without aggravation of symptoms. Pt will benefit from continued skilled physical therapy services to improve strength, and function.         PT Short Term Goals - 09/27/19 1451      PT SHORT TERM GOAL #1   Title Patient will be independent with her HEP to improve strength, balance, and promote ability to ambulate without AD.    Time 3    Period Weeks    Status New    Target Date 10/20/19             PT Long Term Goals - 09/27/19 1452      PT LONG TERM GOAL #1   Title Patient will be able to ambulate at least 500 ft without AD and no LOB to promote mobility.    Baseline Currently uses an SPC to ambulate (09/27/2019)    Time 6    Period Weeks    Status New    Target Date 11/10/19      PT LONG TERM GOAL #2   Title Patient will improve bilateral hip strength to promote ability to ambulate without AD.    Time 6    Period Weeks    Status New    Target Date 11/10/19      PT LONG TERM GOAL #3   Title Patient will improve her hip FOTO by at least 10 points as a demonstration of improved function.    Baseline Hip FOTO: 55 (09/27/2019)    Time 6    Period Weeks    Status New    Target Date 11/10/19                 Plan - 10/20/19 1738  Clinical Impression Statement Improving ability to ambulate with SPC without complain of pain. Still demonstrates lateral lean during gait. Continued working on glute med and max and general LE strengthening to  decrease gait compensation. Pt tolerated session well without aggravation of symptoms. Pt will benefit from continued skilled physical therapy services to improve strength, and function.    Personal Factors and Comorbidities Age;Comorbidity 3+    Comorbidities HTN, arthritis, sciatica    Examination-Activity Limitations Stairs    Stability/Clinical Decision Making Stable/Uncomplicated    Rehab Potential Good    PT Frequency 2x / week    PT Duration 6 weeks    PT Treatment/Interventions Therapeutic activities;Therapeutic exercise;Balance training;Neuromuscular re-education;Patient/family education;Manual techniques;Dry needling;Aquatic Therapy;Electrical Stimulation;Iontophoresis 4mg /ml Dexamethasone;Gait training;Stair training    PT Next Visit Plan trunk, hip strengthening, balance, gait, manual techniques, modalities PRN    Consulted and Agree with Plan of Care Patient           Patient will benefit from skilled therapeutic intervention in order to improve the following deficits and impairments:  Improper body mechanics, Decreased strength, Difficulty walking, Abnormal gait  Visit Diagnosis: Pain in right hip  Muscle weakness (generalized)  Difficulty in walking, not elsewhere classified     Problem List Patient Active Problem List   Diagnosis Date Noted  . 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 Pelvic pain in female 07/20/2019  . Hyperlipidemia 07/20/2019  . B12 deficiency 07/20/2019  . Neuropathic pain of left hand 06/23/2017  . Spinal stenosis of lumbosacral region 11/11/2016  . Primary osteoarthritis of right hip 11/11/2016  . Obesity (BMI 30.0-34.9) 09/12/2016  . H/O supraventricular tachycardia 01/14/2016  . Hypertension 12/24/2015  . Back pain with right-sided sciatica 12/24/2015  . Gallstone 10/19/2015  . Biliary colic 10/19/2015  . Diverticulitis 06/28/2014    06/30/2014 PT, DPT   10/20/2019, 6:33 PM  [facility]  [facility] [address], [place] Phone: [phone]   Fax:  [phone]  Name: Hannah Yu MRN: Evalina Field Date of Birth: 1945/12/24

## 2019-10-24 ENCOUNTER — Ambulatory Visit: Payer: Medicare Other

## 2019-10-24 ENCOUNTER — Ambulatory Visit (INDEPENDENT_AMBULATORY_CARE_PROVIDER_SITE_OTHER): Payer: Medicare Other | Admitting: Orthopaedic Surgery

## 2019-10-24 ENCOUNTER — Encounter: Payer: Self-pay | Admitting: Orthopaedic Surgery

## 2019-10-24 ENCOUNTER — Other Ambulatory Visit: Payer: Self-pay

## 2019-10-24 DIAGNOSIS — M25551 Pain in right hip: Secondary | ICD-10-CM

## 2019-10-24 DIAGNOSIS — Z96641 Presence of right artificial hip joint: Secondary | ICD-10-CM

## 2019-10-24 DIAGNOSIS — R262 Difficulty in walking, not elsewhere classified: Secondary | ICD-10-CM

## 2019-10-24 DIAGNOSIS — M6281 Muscle weakness (generalized): Secondary | ICD-10-CM

## 2019-10-24 NOTE — Therapy (Signed)
[place] [facility] [address], [place], [address] Phone: [phone]   Fax:  [phone]  Physical Therapy Treatment And Progress Report  Patient Details  Name: Hannah Yu MRN: [id number] Date of Birth: Jun 14, 1945 Referring Provider (PT): Erskine Emery PA-C   Encounter Date: 10/24/2019   PT End of Session - 10/24/19 1027    Visit Number 4    Number of Visits 13    Date for PT Re-Evaluation 11/10/19    Authorization Type 4    Authorization Time Period of 10 progress report    PT Start Time 1029    PT Stop Time 1112    PT Time Calculation (min) 43 min    Equipment Utilized During Treatment Gait belt    Activity Tolerance Patient tolerated treatment well    Behavior During Therapy [facility] for tasks assessed/performed           Past Medical History:  Diagnosis Date  . Acid reflux   . Anesthesia complication    Tachycardia previously, now on metoprolol  . Arthritis    09/02/2019: per patient "have it real bad in both hands and back"  . Hypertension   . Sciatica    09/02/2019: has had it for about 3 years    Past Surgical History:  Procedure Laterality Date  . ABDOMINAL HYSTERECTOMY    . CATARACT EXTRACTION W/ INTRAOCULAR LENS IMPLANT Bilateral 2007   eyes done within 1 month of each other.  Marland Kitchen SHOULDER ARTHROSCOPY WITH OPEN ROTATOR CUFF REPAIR Right 2012  . TOTAL HIP ARTHROPLASTY Right 09/06/2019  . TOTAL HIP ARTHROPLASTY Right 09/06/2019   Procedure: RIGHT TOTAL HIP ARTHROPLASTY ANTERIOR APPROACH;  Surgeon: Mcarthur Rossetti, MD;  Location: [place];  Service: Orthopedics;  Laterality: Right;    There were no vitals filed for this visit.   Subjective Assessment - 10/24/19 1029    Subjective R hip is good, no pain currently. Sees her surgeon today. R hip was sore after last session. Soreness eased off the day after.    Pertinent History S/P R THA on 09/06/2019.  Pt states surgery doing well. Had  home health PT until last Friday 09/23/19. Stitches removed last Wednesday. Wanted to continue PT to work on balance and gait. Pt also has a hx of R sciatica. Wants to concentrate on balance and being able to walk without the cane. No falls within the last 6 months, no fear of falling. HEP from home health PT includes tandem stance, balance, side stepping with UE support.    Patient Stated Goals improve balance and walk independently    Currently in Pain? No/denies    Pain Score 0-No pain    Pain Onset 1 to 4 weeks ago              [facility] PT Assessment - 10/24/19 1040      Strength   Right Hip Flexion 4/5    Right Hip Extension 4+/5   seated manually resisted   Left Hip Flexion 4+/5    Left Hip Extension 4+/5   seated manually resisted                                PT Education - 10/24/19 1042    Education provided Yes    Education Details ther-ex    [employer]) Educated Patient    Methods Explanation;Demonstration;Tactile cues;Verbal cues    Comprehension  Returned demonstration;Verbalized understanding           Objective  Pt states that she has been using her SPC for the past 2 years secondary to R LE sciatica and R hip pain,   No latex band allergy Blood pressure controlled per pt.  email for exercises and FOTO: [email or web address]  [employer] Access [email or web address]  S/P 7 weeks  Therapeutic exercise  Gait around gym no SPC L and R lateral lean 200 ft x 2, then 100 ft   Seated manually resisted hip flexion, hip extension 1-2x each way for each LE   Reviewed progress/current status with hip strength with pt.   Side stepping 32 ft to the R and 32 ft to the L. Good glute med muscle use felt. Fatigues towards the end.   SLS with B UE assist to promote glute med strength R LE 10x5 seconds for 3 sets           standing hip abduction with B UE assist, yellow band around ankles   R 10x2  L 10x2   Forward step up onto and  over Air Ex Pad with to without UE assist.   R 10x2.   Lateral step up onto Air Ex pad with to without UE assist   R 10x    Sit <> stand without UE assist 5x2 from chair height Able to perform    Improved exercise technique, movement at target joints, use of target muscles after mod verbal, visual, tactile cues.   Response to treatment Pt tolerated session well without aggravation of symptoms  Rest breaks secondary to fatigue   Clinical impression Pt improving ability to ambulate without use of AD with pt able to walk 500 ft without use of AD with rest breaks secondary to fatigue. Pt also demonstrates overall improved bilateral hip strength and function (improved FOTO score) since initial evaluation. Continued working on R LE strengthening to promote ability to perform standing tasks as well as gait with less difficulty. Pt will benefit from continued skilled physical therapy services to improve strength, balance and function.         PT Short Term Goals - 10/24/19 1031      PT SHORT TERM GOAL #1   Title Patient will be independent with her HEP to improve strength, balance, and promote ability to ambulate without AD.    Baseline Pt performing her HEP, no questions (10/24/2019)    Time 3    Period Weeks    Status Achieved    Target Date 10/20/19             PT Long Term Goals - 10/24/19 1047      PT LONG TERM GOAL #1   Title Patient will be able to ambulate at least 500 ft without AD and no LOB to promote mobility.    Baseline Currently uses an SPC to ambulate (09/27/2019); able to ambulate a total of 500 ft without SPC, but rest breaks in between due to fatigue (10/24/2019)    Time 6    Period Weeks    Status Partially Met    Target Date 11/10/19      PT LONG TERM GOAL #2   Title Patient will improve bilateral hip strength to promote ability to ambulate without AD.    Time 6    Period Weeks    Status Partially Met    Target Date 11/10/19       PT LONG TERM GOAL #3   Title Patient  will improve her hip FOTO by at least 10 points as a demonstration of improved function.    Baseline Hip FOTO: 55 (09/27/2019); 82 (10/24/2019)    Time 6    Period Weeks    Status Achieved    Target Date 11/10/19                 Plan - 10/24/19 1026    Clinical Impression Statement Pt improving ability to ambulate without use of AD with pt able to walk 500 ft without use of AD with rest breaks secondary to fatigue. Pt also demonstrates overall improved bilateral hip strength and function (improved FOTO score) since initial evaluation. Continued working on R LE strengthening to promote ability to perform standing tasks as well as gait with less difficulty. Pt will benefit from continued skilled physical therapy services to improve strength, balance and function.    Personal Factors and Comorbidities Age;Comorbidity 3+    Comorbidities HTN, arthritis, sciatica    Examination-Activity Limitations Stairs    Stability/Clinical Decision Making Stable/Uncomplicated    Rehab Potential Good    PT Frequency 2x / week    PT Duration 6 weeks    PT Treatment/Interventions Therapeutic activities;Therapeutic exercise;Balance training;Neuromuscular re-education;Patient/family education;Manual techniques;Dry needling;Aquatic Therapy;Electrical Stimulation;Iontophoresis 87m/ml Dexamethasone;Gait training;Stair training    PT Next Visit Plan trunk, hip strengthening, balance, gait, manual techniques, modalities PRN    Consulted and Agree with Plan of Care Patient           Patient will benefit from skilled therapeutic intervention in order to improve the following deficits and impairments:  Improper body mechanics, Decreased strength, Difficulty walking, Abnormal gait  Visit Diagnosis: Pain in right hip  Muscle weakness (generalized)  Difficulty in walking, not elsewhere classified     Problem List Patient Active Problem List   Diagnosis Date Noted    . 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 Pelvic pain in female 07/20/2019  . Hyperlipidemia 07/20/2019  . B12 deficiency 07/20/2019  . Neuropathic pain of left hand 06/23/2017  . Spinal stenosis of lumbosacral region 11/11/2016  . Primary osteoarthritis of right hip 11/11/2016  . Obesity (BMI 30.0-34.9) 09/12/2016  . H/O supraventricular tachycardia 01/14/2016  . Hypertension 12/24/2015  . Back pain with right-sided sciatica 12/24/2015  . Gallstone 10/19/2015  . Biliary colic 071/[id number]/9747 . Diverticulitis 06/28/2014    MJoneen BoersPT, DPT   10/24/2019, 12:41 PM  [place] [facility] [facility] [address] [place] [address]Phone: [phone]  Fax:  [phone] Name: Hannah WILLHOITEMRN: [id number] of Birth: [birth year]

## 2019-10-24 NOTE — Progress Notes (Signed)
The patient is a very pleasant 74 year old female who is now about 6 weeks out from a right total hip arthroplasty.  She said that she is doing very well and has good strength and good motion.  She still ambulates with a cane but she has been using a cane the last 3 years.  She is hoping to get away from the cane.  She is been going to outpatient physical therapy as well.  Overall she is pleased.  She did see Bronson Curb last month and he did place a steroid injection of her left hip for trochanteric bursitis.  That has done well for her.  On exam her leg lengths are equal when I have her lay supine.  She has good range of motion of her right hip with no pain at all.  At this point we can see her back in 6 months with a standing low AP pelvis and lateral of the right hip.  If there is any issues before then she is welcome to let us know and come see [place].  All questions and concerns were answered and addressed.

## 2019-10-27 ENCOUNTER — Ambulatory Visit: Payer: Medicare Other

## 2019-10-27 ENCOUNTER — Other Ambulatory Visit: Payer: Self-pay

## 2019-10-27 DIAGNOSIS — M6281 Muscle weakness (generalized): Secondary | ICD-10-CM

## 2019-10-27 DIAGNOSIS — M25551 Pain in right hip: Secondary | ICD-10-CM

## 2019-10-27 DIAGNOSIS — R262 Difficulty in walking, not elsewhere classified: Secondary | ICD-10-CM

## 2019-10-27 NOTE — Therapy (Signed)
[facility] [address] [place], [address] Phone: [phone]   Fax:  [phone]  Physical Therapy Treatment  Patient Details  Name: Hannah Yu MRN: [id number] Date of Birth: [birth year] Referring Provider (PT): Erskine Emery PA-C   Encounter Date: 10/27/2019   PT End of Session - 10/27/19 1344    Visit Number 5    Number of Visits 13    Date for PT Re-Evaluation 11/10/19    Authorization Type 5    Authorization Time Period of 10 progress report    PT Start Time 2683    PT Stop Time 1427    PT Time Calculation (min) 42 min    Equipment Utilized During Treatment Gait belt    Activity Tolerance Patient tolerated treatment well    Behavior During Therapy [facility] for tasks assessed/performed           Past Medical History:  Diagnosis Date  . Acid reflux   . Anesthesia complication    Tachycardia previously, now on metoprolol  . Arthritis    09/02/2019: per patient "have it real bad in both hands and back"  . Hypertension   . Sciatica    09/02/2019: has had it for about 3 years    Past Surgical History:  Procedure Laterality Date  . ABDOMINAL HYSTERECTOMY    . CATARACT EXTRACTION W/ INTRAOCULAR LENS IMPLANT Bilateral 2007   eyes done within 1 month of each other.  Marland Kitchen SHOULDER ARTHROSCOPY WITH OPEN ROTATOR CUFF REPAIR Right 2012  . TOTAL HIP ARTHROPLASTY Right 09/06/2019  . TOTAL HIP ARTHROPLASTY Right 09/06/2019   Procedure: RIGHT TOTAL HIP ARTHROPLASTY ANTERIOR APPROACH;  Surgeon: Mcarthur Rossetti, MD;  Location: [facility];  Service: Orthopedics;  Laterality: Right;    There were no vitals filed for this visit.   Subjective Assessment - 10/27/19 1345    Subjective Pt over did it on Monday. Could hardly walk on Tuesday and felt bilateral lateral thigh sorenss. Feels better today. Took a pain pill yesterday and has not taken a pain pill in weeks. No bilateral lateral thigh soreness  currently. No pain currently.    Pertinent History S/P R THA on 09/06/2019.  Pt states surgery doing well. Had home health PT until last Friday 09/23/19. Stitches removed last Wednesday. Wanted to continue PT to work on balance and gait. Pt also has a hx of R sciatica. Wants to concentrate on balance and being able to walk without the cane. No falls within the last 6 months, no fear of falling. HEP from home health PT includes tandem stance, balance, side stepping with UE support.    Patient Stated Goals improve balance and walk independently    Currently in Pain? No/denies    Pain Score 0-No pain    Pain Onset More than a month ago                                     PT Education - 10/27/19 1348    Education provided Yes    Education Details ther-ex    [employer]) Educated Patient    Methods Explanation;Demonstration;Tactile cues;Verbal cues    Comprehension Returned demonstration;Verbalized understanding          Objective  Pt states that she has been using her SPC for the past 2 years secondary to R LE sciatica and R hip pain,   No  latex band allergy Blood pressure controlled per pt.  email for exercises and FOTO: [email or web address]  [employer] Access [email or web address]  S/P7weeks  Therapeutic exercise   Side stepping 32 ft to the R and 32 ft to the L. Good glute med muscle use felt. Fatigues towards the end.   Lateral step up onto Air Ex pad with B UE assist              R 10x2  Forward step up onto and over Air Ex Pad with B UE assist.              R 10x2.   Sit <> stand without UE assist [id number] chair height Able to perform   SLSwith B UE light touch assist to promote glute med strength R LE10x5 secondsfor 3 sets   standing hip abduction with B UE assist, no resistance              R 10x2 with 5 seconds holds              L 10x2 with 5 second holds     Step ups onto firs regular  step Forward with L UE assist R 10x3 Lateral with B UE assist R 10x, then 5x  SLS  on R LE with L foot on bosu with small physioball toss to trampoline 20x2 to promote R LE balance and stability      Improved exercise technique, movement at target joints, use of target muscles after mod verbal, visual, tactile cues.   Response to treatment Pt tolerated session well without aggravation of symptoms  Therapeutic rest breaks secondary to fatigue   Clinical impression Continued working on improving R LE strength and balance especially the R glute med and quadriceps to promote ability ambulate and perform standing tasks with less difficulty and less need for AD. Therapeutic rest breaks needed secondary to fatigue. Pt will benefit from continued skilled physical therapy services to improve strength, endurance and function.        PT Short Term Goals - 10/24/19 1031      PT SHORT TERM GOAL #1   Title Patient will be independent with her HEP to improve strength, balance, and promote ability to ambulate without AD.    Baseline Pt performing her HEP, no questions (10/24/2019)    Time 3    Period Weeks    Status Achieved    Target Date 10/20/19             PT Long Term Goals - 10/24/19 1047      PT LONG TERM GOAL #1   Title Patient will be able to ambulate at least 500 ft without AD and no LOB to promote mobility.    Baseline Currently uses an SPC to ambulate (09/27/2019); able to ambulate a total of 500 ft without SPC, but rest breaks in between due to fatigue (10/24/2019)    Time 6    Period Weeks    Status Partially Met    Target Date 11/10/19      PT LONG TERM GOAL #2   Title Patient will improve bilateral hip strength to promote ability to ambulate without AD.    Time 6    Period Weeks    Status Partially Met    Target Date 11/10/19      PT LONG TERM GOAL #3   Title Patient will improve her hip FOTO by at least 10 points as a  demonstration of improved function.  Baseline Hip FOTO: 55 (09/27/2019); 82 (10/24/2019)    Time 6    Period Weeks    Status Achieved    Target Date 11/10/19                 Plan - 10/27/19 1404    Clinical Impression Statement Continued working on improving R LE strength and balance especially the R glute med and quadriceps to promote ability ambulate and perform standing tasks with less difficulty and less need for AD. Therapeutic rest breaks needed secondary to fatigue. Pt will benefit from continued skilled physical therapy services to improve strength, endurance and function.    Personal Factors and Comorbidities Age;Comorbidity 3+    Comorbidities HTN, arthritis, sciatica    Examination-Activity Limitations Stairs    Stability/Clinical Decision Making Stable/Uncomplicated    Rehab Potential Good    PT Frequency 2x / week    PT Duration 6 weeks    PT Treatment/Interventions Therapeutic activities;Therapeutic exercise;Balance training;Neuromuscular re-education;Patient/family education;Manual techniques;Dry needling;Aquatic Therapy;Electrical Stimulation;Iontophoresis 62m/ml Dexamethasone;Gait training;Stair training    PT Next Visit Plan trunk, hip strengthening, balance, gait, manual techniques, modalities PRN    Consulted and Agree with Plan of Care Patient           Patient will benefit from skilled therapeutic intervention in order to improve the following deficits and impairments:  Improper body mechanics, Decreased strength, Difficulty walking, Abnormal gait  Visit Diagnosis: Pain in right hip  Muscle weakness (generalized)  Difficulty in walking, not elsewhere classified     Problem List Patient Active Problem List   Diagnosis Date Noted  . 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 Pelvic pain in female 07/20/2019  . Hyperlipidemia 07/20/2019  . B12 deficiency 07/20/2019  . Neuropathic pain of left hand 06/23/2017  . Spinal stenosis of  lumbosacral region 11/11/2016  . Primary osteoarthritis of right hip 11/11/2016  . Obesity (BMI 30.0-34.9) 09/12/2016  . H/O supraventricular tachycardia 01/14/2016  . Hypertension 12/24/2015  . Back pain with right-sided sciatica 12/24/2015  . Gallstone 10/19/2015  . Biliary colic 002/72/5366 . Diverticulitis 06/28/2014    MJoneen BoersPT, DPT   10/27/2019, 5:25 PM  [facility] [address] [place] [address]Phone: [phone]  Fax:  [phone] Name: Hannah MANNERMRN: [id number] of Birth: [birth year]

## 2019-10-31 ENCOUNTER — Ambulatory Visit: Payer: Medicare Other

## 2019-11-09 ENCOUNTER — Ambulatory Visit (INDEPENDENT_AMBULATORY_CARE_PROVIDER_SITE_OTHER): Payer: Medicare Other | Admitting: Orthopaedic Surgery

## 2019-11-09 ENCOUNTER — Ambulatory Visit: Payer: Self-pay

## 2019-11-09 ENCOUNTER — Encounter: Payer: Self-pay | Admitting: Orthopaedic Surgery

## 2019-11-09 DIAGNOSIS — M7062 Trochanteric bursitis, left hip: Secondary | ICD-10-CM | POA: Diagnosis not present

## 2019-11-09 DIAGNOSIS — Z96641 Presence of right artificial hip joint: Secondary | ICD-10-CM

## 2019-11-09 MED ORDER — METHYLPREDNISOLONE ACETATE 40 MG/ML IJ SUSP
40.0000 mg | INTRAMUSCULAR | Status: AC | PRN
Start: 2019-11-09 — End: 2019-11-09
  Administered 2019-11-09: 40 mg via INTRA_ARTICULAR

## 2019-11-09 MED ORDER — LIDOCAINE HCL 1 % IJ SOLN
3.0000 mL | INTRAMUSCULAR | Status: AC | PRN
  Administered 2019-11-09: 3 mL

## 2019-11-09 NOTE — Progress Notes (Signed)
   Procedure Note  Patient: Hannah Yu             Date of Birth: 1945-08-13           MRN: [id number]             Visit Date: 11/09/2019  Procedures: Visit Diagnoses:  1. Status post total replacement of right hip   2. Trochanteric bursitis, left hip     Large Joint Inj: L greater trochanter on 11/09/2019 3:19 PM Indications: pain and diagnostic evaluation Details: 22 G 1.5 in needle, lateral approach  Arthrogram: No  Medications: 3 mL lidocaine 1 %; 40 mg methylPREDNISolone acetate 40 MG/ML Outcome: tolerated well, no immediate complications Procedure, treatment alternatives, risks and benefits explained, specific risks discussed. Consent was given by the patient. Immediately prior to procedure a time out was called to verify the correct patient, procedure, equipment, support staff and site/side marked as required. Patient was prepped and draped in the usual sterile fashion.     The patient is here today for follow-up at 8-week status post a right total hip arthroplasty.  She says the right hip is doing very well for her.  She denies any way with a cane.  She is requesting an injection in her left hip for trochanteric bursitis.  Her left hip is moving well with some pain in the groin which is minimal.  There is significant pain to palpation of the trochanteric area.  Her right operative hip exam is entirely normal.  I did place a steroid injection in her left hip trochanteric area without difficulty.  We will see her back in December of this year for standing low AP pelvis and lateral of her right operative hip.  All questions and concerns were answered and addressed and otherwise if there is any issues she knows to come see [place] earlier call us but if not we will see her in December with repeat hip films.

## 2019-11-23 ENCOUNTER — Telehealth: Payer: Self-pay | Admitting: Orthopaedic Surgery

## 2019-11-23 NOTE — Telephone Encounter (Signed)
Patient called advised there is a pouch of fluid on her right hip and want to know if she should schedule an appointment. The number to contact patient is [phone]

## 2019-11-24 ENCOUNTER — Encounter: Payer: Self-pay | Admitting: Orthopedic Surgery

## 2019-11-24 ENCOUNTER — Other Ambulatory Visit: Payer: Self-pay

## 2019-11-24 ENCOUNTER — Ambulatory Visit (INDEPENDENT_AMBULATORY_CARE_PROVIDER_SITE_OTHER): Payer: Medicare Other | Admitting: Orthopedic Surgery

## 2019-11-24 VITALS — Ht 61.0 in | Wt 173.0 lb

## 2019-11-24 DIAGNOSIS — Z96641 Presence of right artificial hip joint: Secondary | ICD-10-CM

## 2019-11-24 NOTE — Progress Notes (Signed)
Office Visit Note   Patient: Hannah Yu           Date of Birth: 1946-01-06           MRN: [id number] Visit Date: 11/24/2019              Requested by: Jerl Mina, MD [address] [place],  [place] [address] PCP: Jerl Mina, MD  Chief Complaint  Patient presents with  . Right Hip - Routine Post Op    09/06/19 Right THA Work-in Fluid-buildup on right hip incision site      HPI: Patient is a 74 year old woman who presents, 2.5 months status post right total hip arthroplasty.  She states she feels like she has swelling and possible fluid buildup lateral to the incision.  Patient states she had a greater trochanter injection of her left hip 2 weeks ago that helped a lot.  Assessment & Plan: Visit Diagnoses:  1. Status post total replacement of right hip     Plan: Patient does have some swelling but there is no fluctuance no redness no cellulitis no signs of infection recommended that she wear spanks to help decrease the swelling.  She will follow-up routinely with Dr. Magnus Ivan in December  Follow-Up Instructions: Return if symptoms worsen or fail to improve.   Ortho Exam  Patient is alert, oriented, no adenopathy, well-dressed, normal affect, normal respiratory effort. Examination the surgical incision is well-healed there is no redness no cellulitis no drainage.  Lateral to the incision she does have some swelling this is nontender to palpation there is no fluctuance no signs of a fluid collection no signs of infection.  Imaging: No results found. No images are attached to the encounter.  Labs: No results found for: HGBA1C, ESRSEDRATE, CRP, LABURIC, REPTSTATUS, GRAMSTAIN, CULT, LABORGA   No results found for: ALBUMIN, PREALBUMIN, LABURIC  No results found for: MG No results found for: VD25OH  No results found for: PREALBUMIN CBC EXTENDED Latest Ref Rng & Units 09/10/2019 09/08/2019 09/07/2019  WBC 4.0 - 10.5 K/uL 10.1 12.8(H) 10.8(H)    RBC 3.87 - 5.11 MIL/uL 3.60(L) 3.55(L) 5.20(H)  HGB 12.0 - 15.0 g/dL 0.0(Q) 6.7(Y) 19.5  HCT 36 - 46 % 30.7(L) 30.3(L) 45.1  PLT 150 - 400 K/uL 363 270 PLATELET CLUMPS NOTED ON SMEAR, COUNT APPEARS ADEQUATE     Body mass index is 32.69 kg/m.  Orders:  No orders of the defined types were placed in this encounter.  No orders of the defined types were placed in this encounter.    Procedures: No procedures performed  Clinical Data: No additional findings.  ROS:  All other systems negative, except as noted in the HPI. Review of Systems  Objective: Vital Signs: Ht 5\' 1"  (1.549 m)   Wt 173 lb (78.5 kg)   BMI 32.69 kg/m   Specialty Comments:  No specialty comments available.  PMFS History: Patient Active Problem List   Diagnosis Date Noted  . 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 Pelvic pain in female 07/20/2019  . Hyperlipidemia 07/20/2019  . B12 deficiency 07/20/2019  . Neuropathic pain of left hand 06/23/2017  . Spinal stenosis of lumbosacral region 11/11/2016  . Primary osteoarthritis of right hip 11/11/2016  . Obesity (BMI 30.0-34.9) 09/12/2016  . H/O supraventricular tachycardia 01/14/2016  . Hypertension 12/24/2015  . Back pain with right-sided sciatica 12/24/2015  . Gallstone 10/19/2015  . Biliary colic 10/19/2015  . Diverticulitis 06/28/2014   Past  Medical History:  Diagnosis Date  . Acid reflux   . Anesthesia complication    Tachycardia previously, now on metoprolol  . Arthritis    09/02/2019: per patient "have it real bad in both hands and back"  . Hypertension   . Sciatica    09/02/2019: has had it for about 3 years    Family History  Problem Relation Age of Onset  . Breast cancer Sister 2    Past Surgical History:  Procedure Laterality Date  . ABDOMINAL HYSTERECTOMY    . CATARACT EXTRACTION W/ INTRAOCULAR LENS IMPLANT Bilateral 2007   eyes done within 1 month of each other.  Marland Kitchen SHOULDER ARTHROSCOPY WITH OPEN ROTATOR  CUFF REPAIR Right 2012  . TOTAL HIP ARTHROPLASTY Right 09/06/2019  . TOTAL HIP ARTHROPLASTY Right 09/06/2019   Procedure: RIGHT TOTAL HIP ARTHROPLASTY ANTERIOR APPROACH;  Surgeon: Kathryne Hitch, MD;  Location: [facility];  Service: Orthopedics;  Laterality: Right;   Social History   Occupational History  . Not on file  Tobacco Use  . Smoking status: Former Smoker    Quit date: 01/02/1993    Years since quitting: 26.9  . Smokeless tobacco: Never Used  Vaping Use  . Vaping Use: Never used  Substance and Sexual Activity  . Alcohol use: Yes    Alcohol/week: 4.0 standard drinks    Types: 4 Glasses of wine per week    Comment: 09/02/2019: per patient "ever so often"  . Drug use: No  . Sexual activity: Not on file

## 2019-11-24 NOTE — Telephone Encounter (Signed)
Pt is s/p a total hip 09/2019 called and made an appt for her to have eval today with Dr/. Lajoyce Corners

## 2020-02-07 ENCOUNTER — Ambulatory Visit
Admission: RE | Admit: 2020-02-07 | Discharge: 2020-02-07 | Disposition: A | Discharge status: Home / Self Care | Payer: Medicare Other | Source: Ambulatory Visit | Attending: Infectious Diseases | Admitting: Infectious Diseases

## 2020-02-07 ENCOUNTER — Other Ambulatory Visit: Payer: Self-pay

## 2020-02-07 DIAGNOSIS — Z1231 Encounter for screening mammogram for malignant neoplasm of breast: Secondary | ICD-10-CM | POA: Diagnosis present

## 2020-02-13 ENCOUNTER — Other Ambulatory Visit: Payer: Self-pay | Admitting: Student

## 2020-02-13 DIAGNOSIS — M25511 Pain in right shoulder: Secondary | ICD-10-CM

## 2020-02-14 ENCOUNTER — Other Ambulatory Visit: Payer: Self-pay | Admitting: Infectious Diseases

## 2020-02-14 DIAGNOSIS — N6489 Other specified disorders of breast: Secondary | ICD-10-CM

## 2020-02-14 DIAGNOSIS — R928 Other abnormal and inconclusive findings on diagnostic imaging of breast: Secondary | ICD-10-CM

## 2020-02-16 ENCOUNTER — Ambulatory Visit
Admission: RE | Admit: 2020-02-16 | Discharge: 2020-02-16 | Disposition: A | Discharge status: Home / Self Care | Payer: Medicare Other | Source: Ambulatory Visit | Attending: Infectious Diseases | Admitting: Infectious Diseases

## 2020-02-16 ENCOUNTER — Other Ambulatory Visit: Payer: Self-pay

## 2020-02-16 DIAGNOSIS — N6489 Other specified disorders of breast: Secondary | ICD-10-CM | POA: Diagnosis present

## 2020-02-16 DIAGNOSIS — R928 Other abnormal and inconclusive findings on diagnostic imaging of breast: Secondary | ICD-10-CM

## 2020-04-25 ENCOUNTER — Ambulatory Visit (INDEPENDENT_AMBULATORY_CARE_PROVIDER_SITE_OTHER): Payer: Medicare Other | Admitting: Orthopaedic Surgery

## 2020-04-25 ENCOUNTER — Ambulatory Visit (INDEPENDENT_AMBULATORY_CARE_PROVIDER_SITE_OTHER): Payer: Medicare Other

## 2020-04-25 ENCOUNTER — Other Ambulatory Visit: Payer: Self-pay

## 2020-04-25 ENCOUNTER — Encounter: Payer: Self-pay | Admitting: Orthopaedic Surgery

## 2020-04-25 DIAGNOSIS — Z96641 Presence of right artificial hip joint: Secondary | ICD-10-CM | POA: Diagnosis not present

## 2020-04-25 DIAGNOSIS — M25552 Pain in left hip: Secondary | ICD-10-CM | POA: Diagnosis not present

## 2020-04-25 DIAGNOSIS — M1612 Unilateral primary osteoarthritis, left hip: Secondary | ICD-10-CM | POA: Insufficient documentation

## 2020-04-25 NOTE — Progress Notes (Signed)
The patient is now 7 months status post a right total hip arthroplasty through direct anterior approach.  She still ambulate with a cane but she says is more due to the left hip pain.  We diagnosed her in the past with left hip trochanteric bursitis.  She is now experiencing some groin pain on the left side.  She states that her right total hip arthroplasty is doing well.  She has had no other acute change in her medical status.  Examination of both hips shows the move smoothly and fluidly.  Her right operative hip has no issues at all and no pain in the groin.  Her mobility is good with that hip.  Her left hip still moves smoothly and fluidly with some pain in the groin.  There is pain over the trochanteric area to palpation as well.  She is still ambulating with a cane due to her left hip pain.  An AP pelvis lateral of the right hip shows a well-seated implant on the right side with no complicating features from a total hip arthroplasty.  The left hip shows significant worsening of arthritis when compared to previous films.  There is now complete loss of the superior lateral joint space and flattening of the femoral head.  There is cystic changes in the femoral head as well which are large.  At this point the best treatment option for her left hip would be a hip replacement and she agrees with this as well due to the rapid worsening of her left hip pain combined with the radiographic findings.  I had a long thorough discussion with her today about proceeding with the surgery on the left side.  Having had this done several months ago on the right side she is fully aware of of the intraoperative and postoperative course as well as the risk and benefits involved.  I would not place a steroid injection at this point in her left hip due to the severity of the arthritic findings.  Again this is rapidly worsen when I compare this to previous films.  All question concerns were answered and addressed.  We will work  on getting her scheduled for the surgery.  She agrees with this treatment plan as well.                  +

## 2020-06-28 ENCOUNTER — Other Ambulatory Visit: Payer: Self-pay

## 2020-07-24 ENCOUNTER — Other Ambulatory Visit: Payer: Self-pay | Admitting: Physician Assistant

## 2020-07-26 NOTE — Pre-Procedure Instructions (Signed)
Surgical Instructions:    Your procedure is scheduled on Tuesday 07/31/20.  Report to [facility] Main Entrance "A" at 10:00 A.M., then check in with the Admitting office.  Call this number if you have problems the morning of surgery:  [phone]   If you have any questions prior to your surgery date call [phone]: Open Monday-Friday 8am-4pm.    Remember:  Do not eat after midnight the night before your surgery.  You may drink clear liquids until 09:00 AM the morning of your surgery.   Clear liquids allowed are: Water, Non-Citrus Juices (without pulp), Carbonated Beverages, Clear Tea, Black Coffee Only, and Gatorade.   Enhanced Recovery after Surgery for Orthopedics Enhanced Recovery after Surgery is a protocol used to improve the stress on your body and your recovery after surgery.  Patient Instructions  . The night before surgery:  o No food after midnight. ONLY clear liquids after midnight   . The day of surgery (if you do NOT have diabetes):  o Drink ONE (1) Pre-Surgery Clear Ensure by 9AM the morning of surgery.   o This drink was given to you during your hospital pre-op appointment visit. o Nothing else to drink after completing the Pre-Surgery Clear Ensure.         If you have questions, please contact your surgeon's office.     Take these medicines the morning of surgery with A SIP OF WATER: amLODipine (NORVASC) metoprolol succinate (TOPROL-XL) omeprazole (PRILOSEC)  IF NEEDED: cyclobenzaprine (FLEXERIL) oxyCODONE (OXY IR/ROXICODONE) traMADol (ULTRAM)  Tetrahydrozoline HCl (VISINE OP)   As of today, STOP taking any Aspirin (unless otherwise instructed by your surgeon) Aleve, Naproxen, Ibuprofen, Motrin, Advil, Goody's, BC's, all herbal medications, fish oil, and all vitamins.           The Morning of Surgery:            Do not wear jewelry, make up, or nail polish.            Do not wear lotions, powders, perfumes, or deodorant.            Do not  shave 48 hours prior to surgery.             Do not bring valuables to the hospital.            [facility] is not responsible for any belongings or valuables.  Do NOT Smoke (Tobacco/Vaping) or drink Alcohol 24 hours prior to your procedure.  If you use a CPAP at night, you may bring all equipment for your overnight stay.   Contacts, glasses, dentures or bridgework may not be worn into surgery, please bring cases for these belongings   For patients admitted to the hospital, discharge time will be determined by your treatment team.   Patients discharged the day of surgery will not be allowed to drive home, and someone needs to stay with them for 24 hours.    Special instructions:   [facility]- Preparing For Surgery  Before surgery, you can play an important role. Because skin is not sterile, your skin needs to be as free of germs as possible. You can reduce the number of germs on your skin by washing with CHG (chlorahexidine gluconate) Soap before surgery.  CHG is an antiseptic cleaner which kills germs and bonds with the skin to continue killing germs even after washing.    Oral Hygiene is also important to reduce your risk of infection.  Remember - BRUSH YOUR TEETH THE MORNING  OF SURGERY WITH YOUR REGULAR TOOTHPASTE  Please do not use if you have an allergy to CHG or antibacterial soaps. If your skin becomes reddened/irritated stop using the CHG.  Do not shave (including legs and underarms) for at least 48 hours prior to first CHG shower. It is OK to shave your face.  Please follow these instructions carefully.   1. Shower the NIGHT BEFORE SURGERY and the MORNING OF SURGERY  2. If you chose to wash your hair, wash your hair first as usual with your normal shampoo.  3. After you shampoo, rinse your hair and body thoroughly to remove the shampoo.  4. Wash Face and genitals (private parts) with your normal soap.   5.  Shower the NIGHT BEFORE SURGERY and the MORNING OF SURGERY with  CHG Soap.   6. Use CHG Soap as you would any other liquid soap. You can apply CHG directly to the skin and wash gently with a scrungie or a clean washcloth.   7. Apply the CHG Soap to your body ONLY FROM THE NECK DOWN.  Do not use on open wounds or open sores. Avoid contact with your eyes, ears, mouth and genitals (private parts). Wash Face and genitals (private parts)  with your normal soap.   8. Wash thoroughly, paying special attention to the area where your surgery will be performed.  9. Thoroughly rinse your body with warm water from the neck down.  10. DO NOT shower/wash with your normal soap after using and rinsing off the CHG Soap.  11. Pat yourself dry with a CLEAN TOWEL.  12. Wear CLEAN PAJAMAS to bed the night before surgery  13. Place CLEAN SHEETS on your bed the night before your surgery  14. DO NOT SLEEP WITH PETS.   Day of Surgery: SHOWER WITH CHG SOAP. Wear Clean/Comfortable clothing the morning of surgery. Do not apply any deodorants/lotions.   Remember to brush your teeth WITH YOUR REGULAR TOOTHPASTE.   Please read over the following fact sheets that you were given.

## 2020-07-27 ENCOUNTER — Encounter (HOSPITAL_COMMUNITY)
Admission: RE | Admit: 2020-07-27 | Discharge: 2020-07-27 | Disposition: A | Discharge status: Home / Self Care | Payer: Medicare Other | Source: Ambulatory Visit | Attending: Orthopaedic Surgery | Admitting: Orthopaedic Surgery

## 2020-07-27 ENCOUNTER — Encounter (HOSPITAL_COMMUNITY): Payer: Self-pay

## 2020-07-27 ENCOUNTER — Other Ambulatory Visit: Payer: Self-pay

## 2020-07-27 DIAGNOSIS — K219 Gastro-esophageal reflux disease without esophagitis: Secondary | ICD-10-CM | POA: Insufficient documentation

## 2020-07-27 DIAGNOSIS — Z96641 Presence of right artificial hip joint: Secondary | ICD-10-CM | POA: Diagnosis not present

## 2020-07-27 DIAGNOSIS — Z791 Long term (current) use of non-steroidal anti-inflammatories (NSAID): Secondary | ICD-10-CM | POA: Insufficient documentation

## 2020-07-27 DIAGNOSIS — Z20822 Contact with and (suspected) exposure to covid-19: Secondary | ICD-10-CM | POA: Insufficient documentation

## 2020-07-27 DIAGNOSIS — M1612 Unilateral primary osteoarthritis, left hip: Secondary | ICD-10-CM | POA: Insufficient documentation

## 2020-07-27 DIAGNOSIS — Z01812 Encounter for preprocedural laboratory examination: Secondary | ICD-10-CM | POA: Insufficient documentation

## 2020-07-27 DIAGNOSIS — G9529 Other cord compression: Secondary | ICD-10-CM | POA: Insufficient documentation

## 2020-07-27 DIAGNOSIS — Z7982 Long term (current) use of aspirin: Secondary | ICD-10-CM | POA: Insufficient documentation

## 2020-07-27 DIAGNOSIS — E882 Lipomatosis, not elsewhere classified: Secondary | ICD-10-CM | POA: Insufficient documentation

## 2020-07-27 DIAGNOSIS — Z87891 Personal history of nicotine dependence: Secondary | ICD-10-CM | POA: Insufficient documentation

## 2020-07-27 DIAGNOSIS — R000 Tachycardia, unspecified: Secondary | ICD-10-CM | POA: Diagnosis not present

## 2020-07-27 DIAGNOSIS — Z79899 Other long term (current) drug therapy: Secondary | ICD-10-CM | POA: Insufficient documentation

## 2020-07-27 DIAGNOSIS — Z79891 Long term (current) use of opiate analgesic: Secondary | ICD-10-CM | POA: Insufficient documentation

## 2020-07-27 DIAGNOSIS — M5430 Sciatica, unspecified side: Secondary | ICD-10-CM | POA: Diagnosis not present

## 2020-07-27 LAB — CBC
HCT: 47.3 % — ABNORMAL HIGH (ref 36.0–46.0)
Hemoglobin: 14.6 g/dL (ref 12.0–15.0)
MCH: 25.7 pg — ABNORMAL LOW (ref 26.0–34.0)
MCHC: 30.9 g/dL (ref 30.0–36.0)
MCV: 83.3 fL (ref 80.0–100.0)
Platelets: 388 10*3/uL (ref 150–400)
RBC: 5.68 MIL/uL — ABNORMAL HIGH (ref 3.87–5.11)
RDW: 15.6 % — ABNORMAL HIGH (ref 11.5–15.5)
WBC: 8.7 10*3/uL (ref 4.0–10.5)
nRBC: 0 % (ref 0.0–0.2)

## 2020-07-27 LAB — BASIC METABOLIC PANEL
Anion gap: 10 (ref 5–15)
BUN: 12 mg/dL (ref 8–23)
CO2: 27 mmol/L (ref 22–32)
Calcium: 9.6 mg/dL (ref 8.9–10.3)
Chloride: 101 mmol/L (ref 98–111)
Creatinine, Ser: 0.76 mg/dL (ref 0.44–1.00)
GFR, Estimated: >60 mL/min (ref >60)
Glucose, Bld: 119 mg/dL — ABNORMAL HIGH (ref 70–99)
Potassium: 3.1 mmol/L — ABNORMAL LOW (ref 3.5–5.1)
Sodium: 138 mmol/L (ref 135–145)

## 2020-07-27 LAB — SURGICAL PCR SCREEN
MRSA, PCR: NEGATIVE
Staphylococcus aureus: POSITIVE — AB

## 2020-07-27 LAB — TYPE AND SCREEN
ABO/RH(D): B POS
Antibody Screen: NEGATIVE

## 2020-07-27 LAB — SARS CORONAVIRUS 2 (TAT 6-24 HRS): SARS Coronavirus 2: NEGATIVE

## 2020-07-27 NOTE — Progress Notes (Addendum)
PCP: Clydie Braun, MD Cardiologist: Harold Hedge, MD.  Last saw 02/2016.  No issues.    EKG:  09/11/19 CXR: 09/08/19 ECHO: 09/09/19 Stress Test: 02/05/16 Cardiac Cath: Denies  Fasting Blood Sugar- no Checks Blood Sugar_no__ times a day  OSA/CPAP:  No  ASA:  Patient to call office to check if she should stop ASA Blood Thinners:  No  Covid test 07/27/20  Anesthesia Review:  Yes, patient denies cardiac history.  Patient had tachycardia with gallbladder surgery  in 2017 when intubating.  Aborted surgery and sent to cardiology.  Per patient, everything was normal. Increased Metoprolol to BID  Patient denies shortness of breath, fever, cough, and chest pain at PAT appointment.  Patient verbalized understanding of instructions provided today at the PAT appointment.  Patient asked to review instructions at home and day of surgery.

## 2020-07-28 ENCOUNTER — Encounter: Payer: Self-pay | Admitting: Orthopaedic Surgery

## 2020-07-30 NOTE — Anesthesia Preprocedure Evaluation (Addendum)
Anesthesia Evaluation  Patient identified by MRN, date of birth, ID band Patient awake    Reviewed: Allergy & Precautions, NPO status , Patient's Chart, lab work & pertinent test results  Airway Mallampati: II  TM Distance: >3 FB Neck ROM: Full    Dental  (+) Teeth Intact   Pulmonary neg pulmonary ROS, former smoker,    Pulmonary exam normal        Cardiovascular hypertension, Pt. on medications and Pt. on home beta blockers  Rhythm:Regular Rate:Normal     Neuro/Psych negative neurological ROS  negative psych ROS   GI/Hepatic Neg liver ROS, GERD  Medicated,  Endo/Other  negative endocrine ROS  Renal/GU negative Renal ROS  negative genitourinary   Musculoskeletal  (+) Arthritis , Osteoarthritis,    Abdominal (+)  Abdomen: soft. Bowel sounds: normal.  Peds  Hematology negative hematology ROS (+)   Anesthesia Other Findings   Reproductive/Obstetrics                            Anesthesia Physical Anesthesia Plan  ASA: II  Anesthesia Plan: MAC and Spinal   Post-op Pain Management:    Induction: Intravenous  PONV Risk Score and Plan: 2 and Ondansetron, Dexamethasone, Propofol infusion and Treatment may vary due to age or medical condition  Airway Management Planned: Simple Face Mask, Natural Airway and Nasal Cannula  Additional Equipment: None  Intra-op Plan:   Post-operative Plan:   Informed Consent: I have reviewed the patients History and Physical, chart, labs and discussed the procedure including the risks, benefits and alternatives for the proposed anesthesia with the patient or authorized representative who has indicated his/her understanding and acceptance.     Dental advisory given  Plan Discussed with: CRNA  Anesthesia Plan Comments: (See PAT note written 07/30/2020 by Myra Gianotti, PA-C. Lab Results      Component                Value               Date                       WBC                      8.7                 07/27/2020                HGB                      14.6                07/27/2020                HCT                      47.3 (H)            07/27/2020                MCV                      83.3                07/27/2020                PLT  388                 07/27/2020           Lab Results      Component                Value               Date                      NA                       138                 07/27/2020                K                        3.1 (L)             07/27/2020                CO2                      27                  07/27/2020                GLUCOSE                  119 (H)             07/27/2020                BUN                      12                  07/27/2020                CREATININE               0.76                07/27/2020                CALCIUM                  9.6                 07/27/2020                GFRNONAA                 >60                 07/27/2020                GFRAA                    >60                 09/10/2019            In her chart, there is documentation of prior "difficult airway" but she was unaware of this. [place] 2 was used to place 6.5 ETT on 01/10/16, grade 1 view, 1 attempt. [place] were used 09/06/19 for right THA.   Tachycardic episode 2017 post induction  for cholecystectomy, procedure aborted. On metoprolol now. CV studies at time of episode: CV: Nuclear stress 02/05/16 ([facility]): Negative Lexiscan stress.LV function normal.No evidence of reversible  ischemia.Low risk study.  TTE 02/04/16 ([facility]): INTERPRETATION NORMAL LEFT VENTRICULAR SYSTOLIC FUNCTIONWITH MILD LVH NORMAL RIGHT VENTRICULAR SYSTOLIC FUNCTION MILD VALVULAR REGURGITATION (mild MR, mild TR) NO VALVULAR STENOSIS EF >55% Mitral: MILD MR Tricuspid: MILD TR Closest EF: >55% (Estimated))       Anesthesia Quick Evaluation

## 2020-07-30 NOTE — Progress Notes (Signed)
Anesthesia Chart Review:  Case: [id number] Date/Time: 07/31/20 1145   Procedure: LEFT TOTAL HIP ARTHROPLASTY ANTERIOR APPROACH (Left Hip)   Anesthesia type: Spinal   Pre-op diagnosis: osteoarthritis left hip   Location: [facility] OR ROOM 04 / [place] [facility]   Surgeons: Mcarthur Rossetti, MD      DISCUSSION: Patient is a 75 year old female scheduled for the above procedure.  History includes former smoker (quit 01/02/93), HT, acid reflux, sciatica.  Prominent epidural lipomatosis with marked compression of the thecal sac at L3-4, L4-5, and L5-S1 on MRI 03/21/16. BMI is consistent with obesity.  For anesthesia history, 01/20/16 cholecystectomy was aborted due to developing tachycardia after induction (HR 130's despite esmolol). She was seen by cardiology in PACU and recommendation was for better preoperative HR control with metoprolol. She subsequently followed up with cardiologist Bartholome Bill, MD at [facility]) and underwent additional testing.  Functional study showed normal LV function with no ischemia. Echocardiogram showed normal LV function with mild MR and mild TR. She was felt low risk for elective surgery at that time, continue metoprolol. (In 2021, the patient reported she had previously been advised to double metoprolol dose for future surgeries.)  In her chart, there is documentation of prior "difficult airway" but she was unaware of this. Sab[place] Heck 2 was used to place 6.5 ETT on 01/10/16, grade 1 view, 1 attempt. [place] were used 09/06/19 for right THA.   07/27/20 presurgical COVID-19 test negative. Anesthesia team to evaluate on the day of surgery.   VS: BP (!) 164/83   Pulse 62   Temp 37 C (Oral)   Resp 18   Ht _0  (1.549 m)   Wt 80.1 kg   SpO2 93%   BMI 33.35 kg/m    PROVIDERS: [place] Pink, MD is listed as PCP, but reported as Adrian Prows, MD, last visit 05/30/20. ([place])  - She is not routinely followed by cardiology, but had  evaluation in 2017 by Bartholome Bill, MD with [facility] ([place]) for post-induction tachycardia with reassuring stress and echo.    LABS: Labs reviewed: Acceptable for surgery. (all labs ordered are listed, but only abnormal results are displayed)  Labs Reviewed  SURGICAL PCR SCREEN - Abnormal; Notable for the following components:      Result Value   Staphylococcus aureus POSITIVE (*)    All other components within normal limits  CBC - Abnormal; Notable for the following components:   RBC 5.68 (*)    HCT 47.3 (*)    MCH 25.7 (*)    RDW 15.6 (*)    All other components within normal limits  BASIC METABOLIC PANEL - Abnormal; Notable for the following components:   Potassium 3.1 (*)    Glucose, Bld 119 (*)    All other components within normal limits  SARS CORONAVIRUS 2 (TAT 6-24 HRS)  TYPE AND SCREEN    IMAGES: 1V CXR 09/08/19 (POD#2 post right THA): FINDINGS: Linear densities in the mid and lower left chest. Few streaky densities in the medial right lung base. Upper lungs are clear. Negative for pneumothorax. Heart size is within normal limits. Bone structures are unremarkable. IMPRESSION: Streaky and linear densities in both lungs, left side greater than right. Findings are most compatible with atelectasis and possibly scarring.  MRI L-spine 03/21/16: IMPRESSION: 1. Prominent epidural lipomatosis with marked compression of the thecal sac at L3-4, L4-5, and L5-S1. 2. 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  EKG: 09/11/19: SR with mark sinus arrhythmia.   CV: Nuclear stress 02/05/16 ([facility]): Negative Lexiscan stress.LV function normal.No evidence of reversible  ischemia.Low risk study.   TTE 02/04/16 ([facility]): INTERPRETATION NORMAL LEFT VENTRICULAR SYSTOLIC FUNCTION WITH MILD LVH NORMAL RIGHT VENTRICULAR SYSTOLIC FUNCTION MILD VALVULAR REGURGITATION (mild MR, mild TR) NO VALVULAR STENOSIS EF >55% Mitral:  MILD MR Tricuspid: MILD TR Closest EF: >55% (Estimated)   Past Medical History:  Diagnosis Date  . Acid reflux   . Anesthesia complication    Tachycardia previously, now on metoprolol  . Arthritis    09/02/2019: per patient "have it real bad in both hands and back"  . Hypertension   . Sciatica    09/02/2019: has had it for about 3 years    Past Surgical History:  Procedure Laterality Date  . ABDOMINAL HYSTERECTOMY    . CATARACT EXTRACTION W/ INTRAOCULAR LENS IMPLANT Bilateral 2007   eyes done within 1 month of each other.  Marland Kitchen SHOULDER ARTHROSCOPY WITH OPEN ROTATOR CUFF REPAIR Right 2012  . TOTAL HIP ARTHROPLASTY Right 09/06/2019  . TOTAL HIP ARTHROPLASTY Right 09/06/2019   Procedure: RIGHT TOTAL HIP ARTHROPLASTY ANTERIOR APPROACH;  Surgeon: Mcarthur Rossetti, MD;  Location: [place];  Service: Orthopedics;  Laterality: Right;    MEDICATIONS: . amLODipine (NORVASC) 10 MG tablet  . aspirin EC 81 MG tablet  . cholecalciferol (VITAMIN D3) 25 MCG (1000 UNIT) tablet  . cyclobenzaprine (FLEXERIL) 5 MG tablet  . Docusate Calcium (STOOL SOFTENER PO)  . FIBER PO  . furosemide (LASIX) 40 MG tablet  . Glucosamine-Chondroitin (MOVE FREE PO)  . ibuprofen (ADVIL) 200 MG tablet  . Ibuprofen-diphenhydrAMINE HCl (IBUPROFEN PM) 200-25 MG CAPS  . losartan (COZAAR) 100 MG tablet  . metoprolol succinate (TOPROL-XL) 50 MG 24 hr tablet  . omeprazole (PRILOSEC) 40 MG capsule  . OVER THE COUNTER MEDICATION  . oxyCODONE (OXY IR/ROXICODONE) 5 MG immediate release tablet  . Tetrahydrozoline HCl (VISINE OP)  . traMADol (ULTRAM) 50 MG tablet  . vitamin B-12 (CYANOCOBALAMIN) 1000 MCG tablet   No current facility-administered medications for this encounter.    Myra Gianotti, PA-C Surgical Short Stay/Anesthesiology [facility] Phone [phone] [facility] Phone [phone] 07/30/2020 11:07 AM

## 2020-07-31 ENCOUNTER — Observation Stay (HOSPITAL_COMMUNITY): Payer: Medicare Other

## 2020-07-31 ENCOUNTER — Encounter (HOSPITAL_COMMUNITY): Payer: Self-pay | Admitting: Orthopaedic Surgery

## 2020-07-31 ENCOUNTER — Ambulatory Visit (HOSPITAL_COMMUNITY): Payer: Medicare Other | Admitting: Vascular Surgery

## 2020-07-31 ENCOUNTER — Encounter (HOSPITAL_COMMUNITY)
Admission: RE | Disposition: A | Discharge status: Home / Self Care | Payer: Self-pay | Source: Home / Self Care | Attending: Orthopaedic Surgery

## 2020-07-31 ENCOUNTER — Ambulatory Visit (HOSPITAL_COMMUNITY): Payer: Medicare Other | Admitting: Certified Registered"

## 2020-07-31 ENCOUNTER — Other Ambulatory Visit: Payer: Self-pay

## 2020-07-31 ENCOUNTER — Ambulatory Visit (HOSPITAL_COMMUNITY): Payer: Medicare Other

## 2020-07-31 ENCOUNTER — Observation Stay (HOSPITAL_COMMUNITY)
Admission: RE | Admit: 2020-07-31 | Discharge: 2020-08-01 | Disposition: A | Discharge status: Home / Self Care | Payer: Medicare Other | Attending: Orthopaedic Surgery | Admitting: Orthopaedic Surgery

## 2020-07-31 DIAGNOSIS — Z79899 Other long term (current) drug therapy: Secondary | ICD-10-CM | POA: Diagnosis not present

## 2020-07-31 DIAGNOSIS — Z96641 Presence of right artificial hip joint: Secondary | ICD-10-CM | POA: Diagnosis not present

## 2020-07-31 DIAGNOSIS — Z419 Encounter for procedure for purposes other than remedying health state, unspecified: Secondary | ICD-10-CM

## 2020-07-31 DIAGNOSIS — I10 Essential (primary) hypertension: Secondary | ICD-10-CM | POA: Diagnosis not present

## 2020-07-31 DIAGNOSIS — Z87891 Personal history of nicotine dependence: Secondary | ICD-10-CM | POA: Insufficient documentation

## 2020-07-31 DIAGNOSIS — M1612 Unilateral primary osteoarthritis, left hip: Principal | ICD-10-CM | POA: Diagnosis present

## 2020-07-31 DIAGNOSIS — Z96642 Presence of left artificial hip joint: Secondary | ICD-10-CM

## 2020-07-31 HISTORY — PX: TOTAL HIP ARTHROPLASTY: SHX124

## 2020-07-31 LAB — ABO/RH: ABO/RH(D): B POS

## 2020-07-31 SURGERY — ARTHROPLASTY, HIP, TOTAL, ANTERIOR APPROACH
Anesthesia: Monitor Anesthesia Care | Site: Hip | Laterality: Left

## 2020-07-31 MED ORDER — ACETAMINOPHEN 325 MG PO TABS
325.0000 mg | ORAL_TABLET | Freq: Four times a day (QID) | ORAL | Status: DC | PRN
  Administered 2020-07-31 – 2020-08-01 (×2): 650 mg via ORAL
  Filled 2020-07-31 (×2): qty 2

## 2020-07-31 MED ORDER — CEFAZOLIN SODIUM-DEXTROSE 1-4 GM/50ML-% IV SOLN
1.0000 g | Freq: Four times a day (QID) | INTRAVENOUS | Status: AC
Start: 2020-07-31 — End: 2020-07-31
  Administered 2020-07-31 (×2): 1 g via INTRAVENOUS
  Filled 2020-07-31 (×2): qty 50

## 2020-07-31 MED ORDER — BUPIVACAINE IN DEXTROSE 0.75-8.25 % IT SOLN
INTRATHECAL | Status: DC | PRN
  Administered 2020-07-31: 1.6 mL via INTRATHECAL

## 2020-07-31 MED ORDER — ORAL CARE MOUTH RINSE: 15.0000 mL | Freq: Once | OROMUCOSAL | Status: AC

## 2020-07-31 MED ORDER — ONDANSETRON HCL 4 MG/2ML IJ SOLN
INTRAMUSCULAR | Status: DC | PRN
  Administered 2020-07-31: 4 mg via INTRAVENOUS

## 2020-07-31 MED ORDER — AMLODIPINE BESYLATE 5 MG PO TABS
10.0000 mg | ORAL_TABLET | Freq: Every day | ORAL | Status: DC
  Administered 2020-08-01: 10 mg via ORAL
  Filled 2020-07-31: qty 2

## 2020-07-31 MED ORDER — CHLORHEXIDINE GLUCONATE 0.12 % MT SOLN
15.0000 mL | Freq: Once | OROMUCOSAL | Status: AC
  Administered 2020-07-31: 15 mL via OROMUCOSAL

## 2020-07-31 MED ORDER — FENTANYL CITRATE (PF) 250 MCG/5ML IJ SOLN
INTRAMUSCULAR | Status: AC
  Filled 2020-07-31: qty 5

## 2020-07-31 MED ORDER — ACETAMINOPHEN 10 MG/ML IV SOLN
1000.0000 mg | Freq: Once | INTRAVENOUS | Status: DC | PRN
Start: 2020-07-31 — End: 2020-07-31

## 2020-07-31 MED ORDER — CHLORHEXIDINE GLUCONATE 0.12 % MT SOLN
OROMUCOSAL | Status: AC
  Filled 2020-07-31: qty 15

## 2020-07-31 MED ORDER — PANTOPRAZOLE SODIUM 40 MG PO TBEC
40.0000 mg | DELAYED_RELEASE_TABLET | Freq: Every day | ORAL | Status: DC
  Administered 2020-07-31 – 2020-08-01 (×2): 40 mg via ORAL
  Filled 2020-07-31 (×2): qty 1

## 2020-07-31 MED ORDER — FENTANYL CITRATE (PF) 100 MCG/2ML IJ SOLN
INTRAMUSCULAR | Status: DC | PRN
  Administered 2020-07-31: 50 ug via INTRAVENOUS

## 2020-07-31 MED ORDER — METOCLOPRAMIDE HCL 5 MG PO TABS
5.0000 mg | ORAL_TABLET | Freq: Three times a day (TID) | ORAL | Status: DC | PRN
Start: 2020-07-31 — End: 2020-08-01

## 2020-07-31 MED ORDER — DOCUSATE SODIUM 100 MG PO CAPS
100.0000 mg | ORAL_CAPSULE | Freq: Two times a day (BID) | ORAL | Status: DC
  Administered 2020-07-31 – 2020-08-01 (×2): 100 mg via ORAL
  Filled 2020-07-31 (×2): qty 1

## 2020-07-31 MED ORDER — LACTATED RINGERS IV SOLN: INTRAVENOUS | Status: DC

## 2020-07-31 MED ORDER — METOCLOPRAMIDE HCL 5 MG/ML IJ SOLN
5.0000 mg | Freq: Three times a day (TID) | INTRAMUSCULAR | Status: DC | PRN
Start: 2020-07-31 — End: 2020-08-01

## 2020-07-31 MED ORDER — PROMETHAZINE HCL 25 MG/ML IJ SOLN: 6.2500 mg | INTRAMUSCULAR | Status: DC | PRN

## 2020-07-31 MED ORDER — FENTANYL CITRATE (PF) 100 MCG/2ML IJ SOLN
INTRAMUSCULAR | Status: AC
  Filled 2020-07-31: qty 2

## 2020-07-31 MED ORDER — ONDANSETRON HCL 4 MG PO TABS: 4.0000 mg | ORAL_TABLET | Freq: Four times a day (QID) | ORAL | Status: DC | PRN

## 2020-07-31 MED ORDER — TRANEXAMIC ACID-NACL 1000-0.7 MG/100ML-% IV SOLN
1000.0000 mg | INTRAVENOUS | Status: AC
  Administered 2020-07-31: 1000 mg via INTRAVENOUS
  Filled 2020-07-31: qty 100

## 2020-07-31 MED ORDER — POVIDONE-IODINE 10 % EX SWAB
2.0000 "application " | Freq: Once | CUTANEOUS | Status: AC
  Administered 2020-07-31: 2 via TOPICAL

## 2020-07-31 MED ORDER — PHENOL 1.4 % MT LIQD: 1.0000 | OROMUCOSAL | Status: DC | PRN

## 2020-07-31 MED ORDER — PROPOFOL 500 MG/50ML IV EMUL
INTRAVENOUS | Status: DC | PRN
  Administered 2020-07-31: 100 ug/kg/min via INTRAVENOUS

## 2020-07-31 MED ORDER — VITAMIN D 25 MCG (1000 UNIT) PO TABS
1000.0000 [IU] | ORAL_TABLET | Freq: Every day | ORAL | Status: DC
  Administered 2020-07-31 – 2020-08-01 (×2): 1000 [IU] via ORAL
  Filled 2020-07-31 (×2): qty 1

## 2020-07-31 MED ORDER — OXYCODONE HCL 5 MG PO TABS: 10.0000 mg | ORAL_TABLET | ORAL | Status: DC | PRN

## 2020-07-31 MED ORDER — LOSARTAN POTASSIUM 50 MG PO TABS
100.0000 mg | ORAL_TABLET | Freq: Every day | ORAL | Status: DC
  Administered 2020-07-31 – 2020-08-01 (×2): 100 mg via ORAL
  Filled 2020-07-31 (×2): qty 2

## 2020-07-31 MED ORDER — SODIUM CHLORIDE 0.9 % IV SOLN: INTRAVENOUS | Status: DC

## 2020-07-31 MED ORDER — SODIUM CHLORIDE 0.9 % IR SOLN
Status: DC | PRN
  Administered 2020-07-31: 3000 mL

## 2020-07-31 MED ORDER — MENTHOL 3 MG MT LOZG: 1.0000 | LOZENGE | OROMUCOSAL | Status: DC | PRN

## 2020-07-31 MED ORDER — ASPIRIN 81 MG PO CHEW
81.0000 mg | CHEWABLE_TABLET | Freq: Two times a day (BID) | ORAL | Status: DC
  Administered 2020-07-31 – 2020-08-01 (×2): 81 mg via ORAL
  Filled 2020-07-31 (×2): qty 1

## 2020-07-31 MED ORDER — OXYCODONE HCL 5 MG PO TABS
5.0000 mg | ORAL_TABLET | ORAL | Status: DC | PRN
  Administered 2020-07-31 – 2020-08-01 (×5): 10 mg via ORAL
  Filled 2020-07-31 (×5): qty 2

## 2020-07-31 MED ORDER — DIPHENHYDRAMINE HCL 12.5 MG/5ML PO ELIX
12.5000 mg | ORAL_SOLUTION | ORAL | Status: DC | PRN
  Filled 2020-07-31: qty 10

## 2020-07-31 MED ORDER — CEFAZOLIN SODIUM-DEXTROSE 2-4 GM/100ML-% IV SOLN
2.0000 g | INTRAVENOUS | Status: AC
  Administered 2020-07-31: 2 g via INTRAVENOUS
  Filled 2020-07-31: qty 100

## 2020-07-31 MED ORDER — 0.9 % SODIUM CHLORIDE (POUR BTL) OPTIME
TOPICAL | Status: DC | PRN
  Administered 2020-07-31: 1000 mL

## 2020-07-31 MED ORDER — PHENYLEPHRINE HCL (PRESSORS) 10 MG/ML IV SOLN
INTRAVENOUS | Status: DC | PRN
  Administered 2020-07-31 (×3): 80 ug via INTRAVENOUS

## 2020-07-31 MED ORDER — ESMOLOL HCL 100 MG/10ML IV SOLN
INTRAVENOUS | Status: DC | PRN
  Administered 2020-07-31: 30 mg via INTRAVENOUS
  Administered 2020-07-31: 50 mg via INTRAVENOUS

## 2020-07-31 MED ORDER — MIDAZOLAM HCL 5 MG/5ML IJ SOLN
INTRAMUSCULAR | Status: DC | PRN
  Administered 2020-07-31: 2 mg via INTRAVENOUS

## 2020-07-31 MED ORDER — VITAMIN B-12 1000 MCG PO TABS
1000.0000 ug | ORAL_TABLET | Freq: Every day | ORAL | Status: DC
  Administered 2020-07-31 – 2020-08-01 (×2): 1000 ug via ORAL
  Filled 2020-07-31 (×2): qty 1

## 2020-07-31 MED ORDER — FENTANYL CITRATE (PF) 100 MCG/2ML IJ SOLN
25.0000 ug | INTRAMUSCULAR | Status: DC | PRN
  Administered 2020-07-31: 25 ug via INTRAVENOUS

## 2020-07-31 MED ORDER — CYCLOBENZAPRINE HCL 5 MG PO TABS
5.0000 mg | ORAL_TABLET | Freq: Three times a day (TID) | ORAL | Status: DC | PRN
  Administered 2020-07-31: 5 mg via ORAL
  Filled 2020-07-31: qty 1

## 2020-07-31 MED ORDER — ALUM & MAG HYDROXIDE-SIMETH 200-200-20 MG/5ML PO SUSP: 30.0000 mL | ORAL | Status: DC | PRN

## 2020-07-31 MED ORDER — METOPROLOL SUCCINATE ER 50 MG PO TB24
50.0000 mg | ORAL_TABLET | Freq: Every day | ORAL | Status: DC
Start: 2020-08-01 — End: 2020-08-01
  Administered 2020-08-01: 50 mg via ORAL
  Filled 2020-07-31: qty 1

## 2020-07-31 MED ORDER — ONDANSETRON HCL 4 MG/2ML IJ SOLN: 4.0000 mg | Freq: Four times a day (QID) | INTRAMUSCULAR | Status: DC | PRN

## 2020-07-31 MED ORDER — HYDROMORPHONE HCL 1 MG/ML IJ SOLN
0.5000 mg | INTRAMUSCULAR | Status: DC | PRN
  Administered 2020-07-31: 1 mg via INTRAVENOUS
  Filled 2020-07-31: qty 1

## 2020-07-31 MED ORDER — PHENYLEPHRINE HCL-NACL 10-0.9 MG/250ML-% IV SOLN
INTRAVENOUS | Status: DC | PRN
  Administered 2020-07-31: 40 ug/min via INTRAVENOUS

## 2020-07-31 MED ORDER — FUROSEMIDE 40 MG PO TABS
40.0000 mg | ORAL_TABLET | Freq: Every day | ORAL | Status: DC
  Administered 2020-07-31 – 2020-08-01 (×2): 40 mg via ORAL
  Filled 2020-07-31 (×2): qty 1

## 2020-07-31 MED ORDER — MIDAZOLAM HCL 2 MG/2ML IJ SOLN
INTRAMUSCULAR | Status: AC
  Filled 2020-07-31: qty 2

## 2020-07-31 SURGICAL SUPPLY — 55 items
APL SKNCLS STERI-STRIP NONHPOA (GAUZE/BANDAGES/DRESSINGS) ×1
BENZOIN TINCTURE PRP APPL 2/3 (GAUZE/BANDAGES/DRESSINGS) ×2 IMPLANT
BLADE CLIPPER SURG (BLADE) IMPLANT
BLADE SAW SGTL 18X1.27X75 (BLADE) ×2 IMPLANT
COVER SURGICAL LIGHT HANDLE (MISCELLANEOUS) ×2 IMPLANT
COVER WAND RF STERILE (DRAPES) ×2 IMPLANT
CUP SECTOR GRIPTON 50MM (Cup) ×1 IMPLANT
DRAPE C-ARM 42X72 X-RAY (DRAPES) ×2 IMPLANT
DRAPE STERI IOBAN 125X83 (DRAPES) ×1 IMPLANT
DRAPE U-SHAPE 47X51 STRL (DRAPES) ×6 IMPLANT
DRSG AQUACEL AG ADV 3.5X10 (GAUZE/BANDAGES/DRESSINGS) ×2 IMPLANT
DURAPREP 26ML APPLICATOR (WOUND CARE) ×2 IMPLANT
ELECT BLADE 4.0 EZ CLEAN MEGAD (MISCELLANEOUS) ×2
ELECT BLADE 6.5 EXT (BLADE) IMPLANT
ELECT REM PT RETURN 9FT ADLT (ELECTROSURGICAL) ×2
ELECTRODE BLDE 4.0 EZ CLN MEGD (MISCELLANEOUS) ×1 IMPLANT
ELECTRODE REM PT RTRN 9FT ADLT (ELECTROSURGICAL) ×1 IMPLANT
FACESHIELD WRAPAROUND (MASK) ×4 IMPLANT
FACESHIELD WRAPAROUND OR TEAM (MASK) ×2 IMPLANT
GLOVE ECLIPSE 8.0 STRL XLNG CF (GLOVE) ×1 IMPLANT
GLOVE ORTHO TXT STRL SZ7.5 (GLOVE) ×4 IMPLANT
GLOVE SRG 8 PF TXTR STRL LF DI (GLOVE) ×2 IMPLANT
GLOVE SURG UNDER POLY LF SZ8 (GLOVE) ×4
GOWN STRL REUS W/ TWL LRG LVL3 (GOWN DISPOSABLE) ×2 IMPLANT
GOWN STRL REUS W/ TWL XL LVL3 (GOWN DISPOSABLE) ×2 IMPLANT
GOWN STRL REUS W/TWL LRG LVL3 (GOWN DISPOSABLE) ×4
GOWN STRL REUS W/TWL XL LVL3 (GOWN DISPOSABLE) ×4
HANDPIECE INTERPULSE COAX TIP (DISPOSABLE) ×2
HEAD FEM STD 32X+1 STRL (Hips) ×1 IMPLANT
KIT BASIN OR (CUSTOM PROCEDURE TRAY) ×2 IMPLANT
KIT TURNOVER KIT B (KITS) ×2 IMPLANT
LINER ACET PNNCL PLUS4 NEUTRAL (Hips) IMPLANT
MANIFOLD NEPTUNE II (INSTRUMENTS) ×2 IMPLANT
NS IRRIG 1000ML POUR BTL (IV SOLUTION) ×2 IMPLANT
PACK TOTAL JOINT (CUSTOM PROCEDURE TRAY) ×2 IMPLANT
PAD ARMBOARD 7.5X6 YLW CONV (MISCELLANEOUS) ×2 IMPLANT
PINNACLE PLUS 4 NEUTRAL (Hips) ×2 IMPLANT
SET HNDPC FAN SPRY TIP SCT (DISPOSABLE) ×1 IMPLANT
STAPLER VISISTAT 35W (STAPLE) IMPLANT
STEM CORAIL KLA11 (Stem) ×1 IMPLANT
STRIP CLOSURE SKIN 1/2X4 (GAUZE/BANDAGES/DRESSINGS) ×3 IMPLANT
SUT ETHIBOND NAB CT1 #1 30IN (SUTURE) ×2 IMPLANT
SUT MNCRL AB 4-0 PS2 18 (SUTURE) IMPLANT
SUT VIC AB 0 CT1 27 (SUTURE) ×2
SUT VIC AB 0 CT1 27XBRD ANBCTR (SUTURE) ×1 IMPLANT
SUT VIC AB 1 CT1 27 (SUTURE) ×2
SUT VIC AB 1 CT1 27XBRD ANBCTR (SUTURE) ×1 IMPLANT
SUT VIC AB 2-0 CT1 27 (SUTURE) ×2
SUT VIC AB 2-0 CT1 TAPERPNT 27 (SUTURE) ×1 IMPLANT
TOWEL GREEN STERILE (TOWEL DISPOSABLE) ×2 IMPLANT
TOWEL GREEN STERILE FF (TOWEL DISPOSABLE) ×2 IMPLANT
TRAY CATH 16FR W/PLASTIC CATH (SET/KITS/TRAYS/PACK) IMPLANT
TRAY FOLEY W/BAG SLVR 16FR (SET/KITS/TRAYS/PACK) ×2
TRAY FOLEY W/BAG SLVR 16FR ST (SET/KITS/TRAYS/PACK) IMPLANT
WATER STERILE IRR 1000ML POUR (IV SOLUTION) ×2 IMPLANT

## 2020-07-31 NOTE — Evaluation (Signed)
Physical Therapy Evaluation Patient Details Name: Hannah Yu MRN: [id number] DOB: 09/11/1945 Today's Date: 07/31/2020   History of Present Illness  Pt adm 3/29 for lt THR direct anterior. PMH - rt thr with hypoxic resp failure post op, htn, back pain, sciatica, arthritis  Clinical Impression  Pt admitted with above diagnosis and presents to PT with functional limitations due to deficits listed below (See PT problem list). Pt needs skilled PT to maximize independence and safety to allow discharge to home with brother to assist. .      Follow Up Recommendations Follow surgeon's recommendation for DC plan and follow-up therapies    Equipment Recommendations  None recommended by PT    Recommendations for Other Services       Precautions / Restrictions        Mobility  Bed Mobility Overal bed mobility: Needs Assistance Bed Mobility: Supine to Sit     Supine to sit: Min assist;HOB elevated     General bed mobility comments: Assist to bring LLE off of bed and to elevate trunk into sitting    Transfers Overall transfer level: Needs assistance Equipment used: Rolling walker (2 wheeled) Transfers: Sit to/from Stand Sit to Stand: Min guard         General transfer comment: Assist for safety and verbal cues for hand placement  Ambulation/Gait Ambulation/Gait assistance: Min guard Gait Distance (Feet): 4 Feet Assistive device: Rolling walker (2 wheeled) Gait Pattern/deviations: Step-through pattern;Decreased step length - right;Decreased step length - left;Decreased stance time - left;Decreased weight shift to left;Antalgic Gait velocity: decr Gait velocity interpretation: <1.31 ft/sec, indicative of household ambulator General Gait Details: Assist for safety. Distance limited by dizziness.  Stairs            Wheelchair Mobility    Modified Rankin (Stroke Patients Only)       Balance Overall balance assessment: No apparent balance deficits (not  formally assessed)                                           Pertinent Vitals/Pain Pain Assessment: 0-10 Pain Score: 7  Pain Location: lt hip Pain Descriptors / Indicators: Operative site guarding;Grimacing Pain Intervention(s): Monitored during session;Repositioned;Premedicated before session    Home Living Family/patient expects to be discharged to:: Private residence Living Arrangements: Alone Available Help at Discharge: Family;Available 24 hours/day (brother) Type of Home: House Home Access: Stairs to enter Entrance Stairs-Rails: Right;Left;Can reach both Entrance Stairs-Number of Steps: 7 Home Layout: Two level;1/2 bath on main level Home Equipment: Cane - single point;Shower seat;Walker - 2 wheels;Bedside commode Additional Comments: Brother is going to stay with pt. After Rt THR last year pt stayed on main level on couch the first night and then after that stayed upstairs in bedroom    Prior Function Level of Independence: Independent with assistive device(s)         Comments: Was using cane for ambulation      Hand Dominance        Extremity/Trunk Assessment   Upper Extremity Assessment Upper Extremity Assessment: Overall WFL for tasks assessed    Lower Extremity Assessment Lower Extremity Assessment: LLE deficits/detail LLE Deficits / Details: Limited by pain. Active movement against gravity.       Communication   Communication: No difficulties  Cognition Arousal/Alertness: Awake/alert Behavior During Therapy: WFL for tasks assessed/performed Overall Cognitive Status: Within Functional Limits for tasks  assessed                                        General Comments      Exercises     Assessment/Plan    PT Assessment Patient needs continued PT services  PT Problem List Decreased strength;Decreased mobility;Decreased activity tolerance;Pain       PT Treatment Interventions DME instruction;Gait  training;Stair training;Functional mobility training;Therapeutic activities;Therapeutic exercise;Patient/family education    PT Goals (Current goals can be found in the Care Plan section)  Acute Rehab PT Goals Patient Stated Goal: return home PT Goal Formulation: With patient Time For Goal Achievement: 08/07/20 Potential to Achieve Goals: Good    Frequency 7X/week   Barriers to discharge Inaccessible home environment stairs to enter and 2 level home    Co-evaluation               AM-PAC PT "6 Clicks" Mobility  Outcome Measure Help needed turning from your back to your side while in a flat bed without using bedrails?: A Little Help needed moving from lying on your back to sitting on the side of a flat bed without using bedrails?: A Little Help needed moving to and from a bed to a chair (including a wheelchair)?: A Little Help needed standing up from a chair using your arms (e.g., wheelchair or bedside chair)?: A Little Help needed to walk in hospital room?: A Little Help needed climbing 3-5 steps with a railing? : A Little 6 Click Score: 18    End of Session Equipment Utilized During Treatment: Gait belt Activity Tolerance: Other (comment) (Limited by dizziness) Patient left: in chair;with call bell/phone within reach Nurse Communication: Mobility status PT Visit Diagnosis: Other abnormalities of gait and mobility (R26.89);Pain Pain - Right/Left: Left Pain - part of body: Hip    Time: 1638-1700 PT Time Calculation (min) (ACUTE ONLY): 22 min   Charges:   PT Evaluation $PT Eval Moderate Complexity: 1 Mod          [facility] PT Acute Rehabilitation Services Pager [phone] Office [phone]   Angelina Ok [facility] 07/31/2020, 5:35 PM

## 2020-07-31 NOTE — Anesthesia Procedure Notes (Signed)
Spinal  Patient location during procedure: OR Start time: 07/31/2020 12:14 PM End time: 07/31/2020 12:16 PM Staffing Performed: anesthesiologist  Anesthesiologist: Atilano Median, DO Preanesthetic Checklist Completed: patient identified, IV checked, site marked, risks and benefits discussed, surgical consent, monitors and equipment checked, pre-op evaluation and timeout performed Spinal Block Patient position: sitting Prep: DuraPrep Patient monitoring: heart rate, cardiac monitor, continuous pulse ox and blood pressure Approach: midline Location: L3-4 Injection technique: single-shot Needle Needle type: Pencan  Needle gauge: 24 G Needle length: 10 cm Assessment Events: CSF return Additional Notes Patient identified. Risks/Benefits/Options discussed with patient including but not limited to bleeding, infection, nerve damage, paralysis, failed block, incomplete pain control, headache, blood pressure changes, nausea, vomiting, reactions to medications, itching and postpartum back pain. Confirmed with bedside nurse the patient's most recent platelet count. Confirmed with patient that they are not currently taking any anticoagulation, have any bleeding history or any family history of bleeding disorders. Patient expressed understanding and wished to proceed. All questions were answered. Sterile technique was used throughout the entire procedure. Please see nursing notes for vital signs. Warning signs of high block given to the patient including shortness of breath, tingling/numbness in hands, complete motor block, or any concerning symptoms with instructions to call for help. Patient was given instructions on fall risk and not to get out of bed. All questions and concerns addressed with instructions to call with any issues or inadequate analgesia.

## 2020-07-31 NOTE — Brief Op Note (Signed)
07/31/2020  1:30 PM  PATIENT:  Hannah Yu  75 y.o. female  PRE-OPERATIVE DIAGNOSIS:  osteoarthritis left hip  POST-OPERATIVE DIAGNOSIS:  osteoarthritis left hip  PROCEDURE:  Procedure(s): LEFT TOTAL HIP ARTHROPLASTY ANTERIOR APPROACH (Left)  SURGEON:  Surgeon(s) and Role:    Kathryne Hitch, MD - Primary  PHYSICIAN ASSISTANT:  Rexene Edison, PA-C  ANESTHESIA:   spinal  EBL:  100 mL   COUNTS:  YES  DICTATION: .Other Dictation: Dictation Number [id number]  PLAN OF CARE: Admit for overnight observation  PATIENT DISPOSITION:  PACU - hemodynamically stable.   Delay start of Pharmacological VTE agent (>24hrs) due to surgical blood loss or risk of bleeding: no

## 2020-07-31 NOTE — Transfer of Care (Signed)
Immediate Anesthesia Transfer of Care Note  Patient: Sudie Bandel Weems  Procedure(s) Performed: LEFT TOTAL HIP ARTHROPLASTY ANTERIOR APPROACH (Left Hip)  Patient Location: PACU  Anesthesia Type:Spinal  Level of Consciousness: awake, alert  and oriented  Airway & Oxygen Therapy: Patient Spontanous Breathing and Patient connected to face mask oxygen  Post-op Assessment: Report given to RN and Post -op Vital signs reviewed and stable  Post vital signs: Reviewed and stable  Last Vitals:  Vitals Value Taken Time  BP 116/70 07/31/20 1349  Temp 36.5 C 07/31/20 1349  Pulse 69 07/31/20 1350  Resp 10 07/31/20 1350  SpO2 97 % 07/31/20 1350  Vitals shown include unvalidated device data.  Last Pain:  Vitals:   07/31/20 1007  TempSrc: Oral         Complications: No complications documented.

## 2020-07-31 NOTE — Anesthesia Postprocedure Evaluation (Signed)
Anesthesia Post Note  Patient: Hannah Yu  Procedure(s) Performed: LEFT TOTAL HIP ARTHROPLASTY ANTERIOR APPROACH (Left Hip)     Patient location during evaluation: PACU Anesthesia Type: MAC and Spinal Level of consciousness: awake and alert Pain management: pain level controlled Vital Signs Assessment: post-procedure vital signs reviewed and stable Respiratory status: spontaneous breathing, nonlabored ventilation, respiratory function stable and patient connected to nasal cannula oxygen Cardiovascular status: stable and blood pressure returned to baseline Postop Assessment: no apparent nausea or vomiting Anesthetic complications: no   No complications documented.  Last Vitals:  Vitals:   07/31/20 1449 07/31/20 1521  BP: 132/69 (!) 144/79  Pulse: 72 73  Resp: 11 17  Temp:  36.5 C  SpO2: 94% 95%    Last Pain:  Vitals:   07/31/20 1521  TempSrc: Oral  PainSc:                  March Rummage Rajah Tagliaferro

## 2020-07-31 NOTE — Anesthesia Procedure Notes (Signed)
Procedure Name: MAC Date/Time: 07/31/2020 12:05 PM Performed by: Babs Bertin, CRNA Pre-anesthesia Checklist: Patient identified, Emergency Drugs available, Suction available, Patient being monitored and Timeout performed Patient Re-evaluated:Patient Re-evaluated prior to induction Oxygen Delivery Method: Simple face mask

## 2020-07-31 NOTE — Op Note (Signed)
NAME: Hannah Yu, Hannah Yu MEDICAL RECORD NO: [id number] ACCOUNT NO: [id number] DATE OF BIRTH: August 15, 1945 FACILITY: [facility] LOCATION: [facility] PHYSICIAN: Vanita Panda. Magnus Ivan, MD  Operative Report   DATE OF PROCEDURE: 07/31/2020  PREOPERATIVE DIAGNOSIS:  Primary osteoarthritis and degenerative joint disease, left hip.  POSTOPERATIVE DIAGNOSIS:  Primary osteoarthritis and degenerative joint disease, left hip.  PROCEDURE:  Left total hip arthroplasty through direct anterior approach.  IMPLANTS:  DePuy sector Gription acetabular component size 50, size 32+4 neutral polyethylene liner, size 11 Corail femoral component with varus offset, size 32+1 metal hip ball.  SURGEON:  Vanita Panda. Magnus Ivan, MD  ASSISTANT: Rexene Edison, PA-C  ANESTHESIA:  Spinal.  ANTIBIOTICS:  2 g IV Ancef.  ESTIMATED BLOOD LOSS:  100 mL.  COMPLICATIONS:  None.  INDICATIONS:  The patient is a 75 year old female with debilitating arthritis involving both of her hips.  I actually replaced her right hip successfully last year.  Her left hip has debilitating pain.  She has x-rays showing basically a subluxed  position of her left hip joint with severe end-stage arthritis.  Left leg is shorter than the right.  At this point, her pain is daily with the left hip.  Having had a successful right total hip arthroplasty, she wishes to proceed with this on the left  and I agree with this.  We talked about the risk of acute blood loss anemia, nerve or vessel injury, fracture, infection, dislocation, DVT, and implant failure as well as skin and soft tissue issues.  We talked about her goals being decreased pain,  improved mobility, and overall improved quality of life.  DESCRIPTION OF PROCEDURE:  After informed consent was obtained, appropriate left hip was marked.  She was brought to the operating room and sat up on the stretcher.  Spinal anesthesia was obtained.  She was laid in supine position on the stretcher.   Foley catheter  was placed and traction boots were placed on both her feet.  Next, she was placed supine on the Hana fracture table, the perineal post in place and both legs in line skeletal traction device and no traction applied.  Her left operative hip  was prepped and draped with DuraPrep and sterile drapes.  A timeout was called, and she was identified as correct patient, correct left hip.  I then made an incision just inferior and posterior to the anterior superior iliac spine and carried this  obliquely down the leg.  We dissected down tensor fascia lata muscle.  Tensor fascia was then divided longitudinally to proceed with direct anterior approach to the hip.  We identified and cauterized circumflex vessels and then identified the hip capsule  and opened up the hip capsule finding a very large joint effusion.  The femoral head was essentially subluxed from the joint.  There was significant debris and wear around the hip joint itself.  We opened up the hip capsule and placed our Cobra  retractor around the medial and lateral femoral neck and then made our femoral neck cut with an oscillating saw just proximal to the lesser trochanter and completed this with an osteotome.  We placed a corkscrew guide and removed the femoral head in its  entirety and found the femoral head completely deformed.  We then placed a bent Hohmann over the medial acetabular rim and removed remnants of the acetabular labrum and other debris, then began reaming under direct visualization from a size 43 reamer in  a stepwise increments going up to size 49 with all reamers  being placed under direct visualization.  The last reamer was also placed under direct fluoroscopy, so I could obtain my depth of reaming, my inclination and anteversion.  I then placed a real  DePuy sector Gription acetabular component size 50 and a 32+4 neutral polyethylene liner for that size acetabular component.  Attention was then turned to the femur.  With the leg  externally rotated to 120 degrees, extended, and adducted, we were able to  place a Mueller retractor medially and a Hohmann retractor above the greater trochanter.  We released lateral joint capsule and used a box cutting osteotome to enter the femoral canal and a rongeur to lateralize. Then began broaching using the Corail  broaching system from a size 8 going up to size 11.  This correlated with her other side as well.  We trialed a standard offset femoral neck and a 32+1 hip ball.  We brought the leg back over and up and with traction and internal rotation reduced in the  pelvis.  We definitely had lengthened her significantly and I felt like she needed more offset, but less lengthening.  We dislocated the hip and removed the trial components.  With that being said, we placed the real femoral component size 11 with a  varus offset femoral neck with a 32+1 metal hip ball. Again reduced this in acetabulum and it was tightened nicely with also decreasing her leg length, but improving her stability and offset.  We verified this clinically and radiographically.  We then  irrigated the soft tissue with normal saline solution using pulsatile lavage.  We closed the joint capsule with #1 Ethibond suture followed by #1 Vicryl to close the tensor fascia.  0 Vicryl was used to close deep tissue and 2-0 Vicryl was used to close  subcutaneous tissue.  The skin was reapproximated with staples.  An Aquacel dressing was applied.  She was taken off the Hana table and taken to recovery room in stable condition with all final counts being correct.  No complications noted.  Of note, Rexene Edison, PA-C, did assist during the entire case and his assistance was crucial for facilitating all aspects of this case.   ROH D: 07/31/2020 1:28:16 pm T: 07/31/2020 4:39:00 pm  JOB: [id number]/ [id number]

## 2020-07-31 NOTE — H&P (Signed)
TOTAL HIP ADMISSION H&P  Patient is admitted for left total hip arthroplasty.  Subjective:  Chief Complaint: left hip pain  HPI: Hannah Yu, 75 y.o. female, has a history of pain and functional disability in the left hip(s) due to arthritis and patient has failed non-surgical conservative treatments for greater than 12 weeks to include NSAID's and/or analgesics, corticosteriod injections, flexibility and strengthening excercises, supervised PT with diminished ADL's post treatment, use of assistive devices, weight reduction as appropriate and activity modification.  Onset of symptoms was gradual starting 3 years ago with gradually worsening course since that time.The patient noted no past surgery on the left hip(s).  Patient currently rates pain in the left hip at 10 out of 10 with activity. Patient has night pain, worsening of pain with activity and weight bearing, trendelenberg gait, pain that interfers with activities of daily living and pain with passive range of motion. Patient has evidence of subchondral sclerosis, periarticular osteophytes and joint space narrowing by imaging studies. This condition presents safety issues increasing the risk of falls.  There is no current active infection.  Patient Active Problem List   Diagnosis Date Noted  . Unilateral primary osteoarthritis, left hip 04/25/2020  . 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 Pelvic pain in female 07/20/2019  . Hyperlipidemia 07/20/2019  . B12 deficiency 07/20/2019  . Neuropathic pain of left hand 06/23/2017  . Spinal stenosis of lumbosacral region 11/11/2016  . Primary osteoarthritis of right hip 11/11/2016  . Obesity (BMI 30.0-34.9) 09/12/2016  . H/O supraventricular tachycardia 01/14/2016  . Hypertension 12/24/2015  . Back pain with right-sided sciatica 12/24/2015  . Gallstone 10/19/2015  . Biliary colic 10/19/2015  . Diverticulitis 06/28/2014   Past Medical History:  Diagnosis Date  .  Acid reflux   . Anesthesia complication    Tachycardia previously, now on metoprolol  . Arthritis    09/02/2019: per patient "have it real bad in both hands and back"  . Hypertension   . Sciatica    09/02/2019: has had it for about 3 years    Past Surgical History:  Procedure Laterality Date  . ABDOMINAL HYSTERECTOMY    . CATARACT EXTRACTION W/ INTRAOCULAR LENS IMPLANT Bilateral 2007   eyes done within 1 month of each other.  Marland Kitchen SHOULDER ARTHROSCOPY WITH OPEN ROTATOR CUFF REPAIR Right 2012  . TOTAL HIP ARTHROPLASTY Right 09/06/2019  . TOTAL HIP ARTHROPLASTY Right 09/06/2019   Procedure: RIGHT TOTAL HIP ARTHROPLASTY ANTERIOR APPROACH;  Surgeon: Kathryne Hitch, MD;  Location: [facility] OR;  Service: Orthopedics;  Laterality: Right;    Current Facility-Administered Medications  Medication Dose Route Frequency Provider Last Rate Last Admin  . ceFAZolin (ANCEF) IVPB 2g/100 mL premix  2 g Intravenous On Call to OR Kirtland Bouchard, PA-C      . chlorhexidine (PERIDEX) 0.12 % solution           . lactated ringers infusion   Intravenous Continuous Stoltzfus, Nelle Don, DO 10 mL/hr at 07/31/20 1115 New Bag at 07/31/20 1115  . tranexamic acid (CYKLOKAPRON) IVPB 1,000 mg  1,000 mg Intravenous To OR Kirtland Bouchard, PA-C       Allergies  Allergen Reactions  . Codeine Diarrhea  . Cortisone Swelling  . Lovastatin Other (See Comments)    Muscle cramps    Social History   Tobacco Use  . Smoking status: Former Smoker    Quit date: 01/02/1993    Years since quitting: 27.5  . Smokeless tobacco: Never  Used  Substance Use Topics  . Alcohol use: Yes    Alcohol/week: 4.0 standard drinks    Types: 4 Glasses of wine per week    Comment: 09/02/2019: per patient "ever so often"    Family History  Problem Relation Age of Onset  . Breast cancer Sister 21     Review of Systems  Musculoskeletal: Positive for gait problem.  All other systems reviewed and are negative.   Objective:  Physical  Exam Vitals reviewed.  Constitutional:      Appearance: Normal appearance.  HENT:     Head: Normocephalic and atraumatic.  Eyes:     Extraocular Movements: Extraocular movements intact.     Pupils: Pupils are equal, round, and reactive to light.  Cardiovascular:     Rate and Rhythm: Normal rate.     Pulses: Normal pulses.  Pulmonary:     Effort: Pulmonary effort is normal.  Abdominal:     Palpations: Abdomen is soft.  Musculoskeletal:     Cervical back: Normal range of motion.     Left hip: Tenderness and bony tenderness present. Decreased range of motion. Decreased strength.  Neurological:     Mental Status: She is alert and oriented to person, place, and time.  Psychiatric:        Behavior: Behavior normal.     Vital signs in last 24 hours: Temp:  [97.8 F (36.6 C)] 97.8 F (36.6 C) (03/29 1007) Pulse Rate:  [116] 116 (03/29 1007) Resp:  [20] 20 (03/29 1007) BP: (146)/(75) 146/75 (03/29 1007) SpO2:  [95 %] 95 % (03/29 1007) Weight:  [78.9 kg] 78.9 kg (03/29 1007)  Labs:   Estimated body mass index is 32.88 kg/m as calculated from the following:   Height as of this encounter: 5\' 1"  (1.549 m).   Weight as of this encounter: 78.9 kg.   Imaging Review Plain radiographs demonstrate severe degenerative joint disease of the left hip(s). The bone quality appears to be good for age and reported activity level.      Assessment/Plan:  End stage arthritis, left hip(s)  The patient history, physical examination, clinical judgement of the provider and imaging studies are consistent with end stage degenerative joint disease of the left hip(s) and total hip arthroplasty is deemed medically necessary. The treatment options including medical management, injection therapy, arthroscopy and arthroplasty were discussed at length. The risks and benefits of total hip arthroplasty were presented and reviewed. The risks due to aseptic loosening, infection, stiffness,  dislocation/subluxation,  thromboembolic complications and other imponderables were discussed.  The patient acknowledged the explanation, agreed to proceed with the plan and consent was signed. Patient is being admitted for inpatient treatment for surgery, pain control, PT, OT, prophylactic antibiotics, VTE prophylaxis, progressive ambulation and ADL's and discharge planning.The patient is planning to be discharged home with home health services

## 2020-08-01 DIAGNOSIS — M1612 Unilateral primary osteoarthritis, left hip: Secondary | ICD-10-CM | POA: Diagnosis not present

## 2020-08-01 LAB — BASIC METABOLIC PANEL
Anion gap: 6 (ref 5–15)
BUN: 7 mg/dL — ABNORMAL LOW (ref 8–23)
CO2: 28 mmol/L (ref 22–32)
Calcium: 8.8 mg/dL — ABNORMAL LOW (ref 8.9–10.3)
Chloride: 100 mmol/L (ref 98–111)
Creatinine, Ser: 0.87 mg/dL (ref 0.44–1.00)
GFR, Estimated: >60 mL/min (ref >60)
Glucose, Bld: 119 mg/dL — ABNORMAL HIGH (ref 70–99)
Potassium: 3.7 mmol/L (ref 3.5–5.1)
Sodium: 134 mmol/L — ABNORMAL LOW (ref 135–145)

## 2020-08-01 LAB — CBC
HCT: 36.9 % (ref 36.0–46.0)
Hemoglobin: 11.6 g/dL — ABNORMAL LOW (ref 12.0–15.0)
MCH: 26.2 pg (ref 26.0–34.0)
MCHC: 31.4 g/dL (ref 30.0–36.0)
MCV: 83.5 fL (ref 80.0–100.0)
Platelets: 330 10*3/uL (ref 150–400)
RBC: 4.42 MIL/uL (ref 3.87–5.11)
RDW: 15.8 % — ABNORMAL HIGH (ref 11.5–15.5)
WBC: 11 10*3/uL — ABNORMAL HIGH (ref 4.0–10.5)
nRBC: 0 % (ref 0.0–0.2)

## 2020-08-01 MED ORDER — OXYCODONE HCL 5 MG PO TABS: 5.0000 mg | ORAL_TABLET | Freq: Four times a day (QID) | ORAL | 0 refills | Status: DC | PRN

## 2020-08-01 MED ORDER — ASPIRIN 81 MG PO CHEW: 81.0000 mg | CHEWABLE_TABLET | Freq: Two times a day (BID) | ORAL | 0 refills | Status: DC

## 2020-08-01 NOTE — Progress Notes (Signed)
Physical Therapy Treatment Patient Details Name: Hannah Yu MRN: [id number] DOB: 02-Jul-1945 Today's Date: 08/01/2020    History of Present Illness Pt adm 3/29 for lt THR direct anterior. PMH - rt thr with hypoxic resp failure post op, htn, back pain, sciatica, arthritis    PT Comments    Pt making great progress with PT this date, ambulating x2 bouts of ~50 ft distances with RW and navigating 7 stairs with bil hand rails with only min guard assist. This allows her to enter/exit her home and walk short household distances. Pt with improved pain tolerance and thus weight bearing tolerance on L leg this date. Reviewed hip precautions and provided pt with handout with pt demonstrating good compliance and understanding. Provided pt with THR exercises handout. Will continue to follow acutely. Current recommendations remain appropriate.   Follow Up Recommendations  Follow surgeon's recommendation for DC plan and follow-up therapies     Equipment Recommendations  None recommended by PT    Recommendations for Other Services       Precautions / Restrictions Precautions Precautions: Anterior Hip Precaution Booklet Issued: Yes (comment) Precaution Comments: Reviewed precautions and exercises verbally, handouts provided Restrictions Weight Bearing Restrictions: No    Mobility  Bed Mobility Overal bed mobility: Needs Assistance Bed Mobility: Supine to Sit     Supine to sit: Min guard     General bed mobility comments: Pt cued to maintain legs proximal to each other while waling them off EOB, flexing L knee to push foot off bed as needed to avoid hip extension, extra time and min guard to complete.    Transfers Overall transfer level: Needs assistance Equipment used: Rolling walker (2 wheeled) Transfers: Sit to/from Stand Sit to Stand: Min guard         General transfer comment: Assist for safety and verbal cues for hand placement  Ambulation/Gait Ambulation/Gait  assistance: Min guard Gait Distance (Feet): 50 Feet (x2) Assistive device: Rolling walker (2 wheeled) Gait Pattern/deviations: Step-through pattern;Decreased step length - right;Decreased step length - left;Decreased stance time - left;Decreased weight shift to left;Antalgic Gait velocity: decr Gait velocity interpretation: <1.31 ft/sec, indicative of household ambulator General Gait Details: Min guard for safety, but no overt LOB. Reviewed pt to avoid turning towards L if possible to avoid external rotation of L hip, but if needed to turn L then take very small steps to avoid excessive rotation.   Stairs Stairs: Yes Stairs assistance: Min guard Stair Management: Two rails;Step to pattern Number of Stairs: 7 General stair comments: Ascending and descending stairs with bil hand rails and step-to pattern with pt demonstrating good understanding and carryover from prior surgery in regards to leading up with good (R) and down with bad (L) leg. No LOB, min guard for safety.   Wheelchair Mobility    Modified Rankin (Stroke Patients Only)       Balance Overall balance assessment: No apparent balance deficits (not formally assessed)                                          Cognition Arousal/Alertness: Awake/alert Behavior During Therapy: WFL for tasks assessed/performed Overall Cognitive Status: Within Functional Limits for tasks assessed  Exercises      General Comments General comments (skin integrity, edema, etc.): Provided THR handout for pt to review, crossing out exercises that require pt to abduct or extend the hip (breaking precautions); educated on car transfers      Pertinent Vitals/Pain Pain Assessment: 0-10 Pain Score: 4  Pain Location: lt hip Pain Descriptors / Indicators: Operative site guarding;Grimacing Pain Intervention(s): Limited activity within patient's tolerance;Monitored during  session;Repositioned    Home Living                      Prior Function            PT Goals (current goals can now be found in the care plan section) Acute Rehab PT Goals Patient Stated Goal: return home PT Goal Formulation: With patient Time For Goal Achievement: 08/07/20 Potential to Achieve Goals: Good Progress towards PT goals: Progressing toward goals    Frequency    7X/week      PT Plan Current plan remains appropriate    Co-evaluation              AM-PAC PT "6 Clicks" Mobility   Outcome Measure  Help needed turning from your back to your side while in a flat bed without using bedrails?: A Little Help needed moving from lying on your back to sitting on the side of a flat bed without using bedrails?: A Little Help needed moving to and from a bed to a chair (including a wheelchair)?: A Little Help needed standing up from a chair using your arms (e.g., wheelchair or bedside chair)?: A Little Help needed to walk in hospital room?: A Little Help needed climbing 3-5 steps with a railing? : A Little 6 Click Score: 18    End of Session Equipment Utilized During Treatment: Gait belt Activity Tolerance: Patient tolerated treatment well Patient left: in chair;with call bell/phone within reach Nurse Communication: Mobility status PT Visit Diagnosis: Other abnormalities of gait and mobility (R26.89);Pain Pain - Right/Left: Left Pain - part of body: Hip     Time: 0802-0828 PT Time Calculation (min) (ACUTE ONLY): 26 min  Charges:  $Gait Training: 8-22 mins $Therapeutic Activity: 8-22 mins                     Raymond Gurney, PT, DPT Acute Rehabilitation Services  Pager: [phone] Office: [phone]    Jewel Baize 08/01/2020, 8:37 AM

## 2020-08-01 NOTE — Discharge Instructions (Signed)

## 2020-08-01 NOTE — Progress Notes (Signed)
Patient is discharged from room 3C08 at this time. Alert and in stable condition. IV site d/c'd and instructions read to patient with understanding verbalized and all questions answered. Left unit via wheelchair with all belongings at side. Patient has an appointment with Dr. Magnus Ivan 08/13/2020

## 2020-08-01 NOTE — Discharge Summary (Signed)
Patient ID: Hannah Yu MRN: [id number] DOB/AGE: [birth year] 75 y.o.  Admit date: 07/31/2020 Discharge date: 08/01/2020  Admission Diagnoses:  Principal Problem:   Unilateral primary osteoarthritis, left hip Active Problems:   Status post total replacement of left hip   Discharge Diagnoses:  Same  Past Medical History:  Diagnosis Date  . Acid reflux   . Anesthesia complication    Tachycardia previously, now on metoprolol  . Arthritis    09/02/2019: per patient "have it real bad in both hands and back"  . Hypertension   . Sciatica    09/02/2019: has had it for about 3 years    Surgeries: Procedure(s): LEFT TOTAL HIP ARTHROPLASTY ANTERIOR APPROACH on 07/31/2020   Consultants:   Discharged Condition: Improved  Hospital Course: Hannah Yu is an 75 y.o. female who was admitted 07/31/2020 for operative treatment ofUnilateral primary osteoarthritis, left hip. Patient has severe unremitting pain that affects sleep, daily activities, and work/hobbies. After pre-op clearance the patient was taken to the operating room on 07/31/2020 and underwent  Procedure(s): LEFT TOTAL HIP ARTHROPLASTY ANTERIOR APPROACH.    Patient was given perioperative antibiotics:  Anti-infectives (From admission, onward)   Start     Dose/Rate Route Frequency Ordered Stop   07/31/20 1800  ceFAZolin (ANCEF) IVPB 1 g/50 mL premix        1 g 100 mL/hr over 30 Minutes Intravenous Every 6 hours 07/31/20 1524 07/31/20 2302   07/31/20 1030  ceFAZolin (ANCEF) IVPB 2g/100 mL premix        2 g 200 mL/hr over 30 Minutes Intravenous On call to O.R. 07/31/20 1025 07/31/20 1250       Patient was given sequential compression devices, early ambulation, and chemoprophylaxis to prevent DVT.  Patient benefited maximally from hospital stay and there were no complications.    Recent vital signs:  Patient Vitals for the past 24 hrs:  BP Temp Temp src Pulse Resp SpO2  08/01/20 0829 126/66 98.2 F (36.8 C) Oral  68 18 94 %  08/01/20 0359 122/88 97.9 F (36.6 C) Oral 86 20 96 %  07/31/20 2322 (!) 114/51 98.4 F (36.9 C) Oral (!) 102 20 95 %  07/31/20 1937 (!) 103/55 (!) 97.5 F (36.4 C) Oral 97 20 95 %     Recent laboratory studies:  Recent Labs    08/01/20 0859  WBC 11.0*  HGB 11.6*  HCT 36.9  PLT 330  NA 134*  K 3.7  CL 100  CO2 28  BUN 7*  CREATININE 0.87  GLUCOSE 119*  CALCIUM 8.8*     Discharge Medications:   Allergies as of 08/01/2020      Reactions   Codeine Diarrhea   Cortisone Swelling   Lovastatin Other (See Comments)   Muscle cramps      Medication List    STOP taking these medications   aspirin EC 81 MG tablet Replaced by: aspirin 81 MG chewable tablet     TAKE these medications   amLODipine 10 MG tablet Commonly known as: NORVASC Take 10 mg by mouth daily.   aspirin 81 MG chewable tablet Chew 1 tablet (81 mg total) by mouth 2 (two) times daily. Replaces: aspirin EC 81 MG tablet   cholecalciferol 25 MCG (1000 UNIT) tablet Commonly known as: VITAMIN D3 Take 1,000 Units by mouth daily.   cyclobenzaprine 5 MG tablet Commonly known as: FLEXERIL Take 5 mg by mouth 3 (three) times daily as needed for muscle spasms.   FIBER PO  Take 1 tablet by mouth daily.   furosemide 40 MG tablet Commonly known as: LASIX Take 40 mg by mouth daily.   ibuprofen 200 MG tablet Commonly known as: ADVIL Take 400 mg by mouth every 6 (six) hours as needed for mild pain or moderate pain.   Ibuprofen PM 200-25 MG Caps Generic drug: Ibuprofen-diphenhydrAMINE HCl Take 2 tablets by mouth at bedtime as needed (sleep).   losartan 100 MG tablet Commonly known as: COZAAR Take 100 mg by mouth daily.   metoprolol succinate 50 MG 24 hr tablet Commonly known as: TOPROL-XL Take 50 mg by mouth daily.   MOVE FREE PO Take 1 tablet by mouth daily.   omeprazole 40 MG capsule Commonly known as: PRILOSEC Take 40 mg by mouth daily.   OVER THE COUNTER MEDICATION Take 1  capsule by mouth daily. CBD gummie   oxyCODONE 5 MG immediate release tablet Commonly known as: Oxy IR/ROXICODONE Take 1-2 tablets (5-10 mg total) by mouth every 6 (six) hours as needed for moderate pain (pain score 4-6). What changed: when to take this   STOOL SOFTENER PO Take 1 tablet by mouth daily as needed (cpmsto[atopm).   traMADol 50 MG tablet Commonly known as: ULTRAM Take 1 tablet (50 mg total) by mouth every 6 (six) hours as needed. What changed: reasons to take this   VISINE OP Place 1 drop into both eyes daily as needed (dry eyes).   vitamin B-12 1000 MCG tablet Commonly known as: CYANOCOBALAMIN Take 1,000 mcg by mouth daily.            Durable Medical Equipment  (From admission, onward)         Start     Ordered   07/31/20 1525  DME 3 n 1  Once        07/31/20 1524   07/31/20 1525  DME Walker rolling  Once       Question Answer Comment  Walker: With 5 Inch Wheels   Patient needs a walker to treat with the following condition Status post total replacement of left hip      07/31/20 1524          Diagnostic Studies: DG Pelvis Portable  Result Date: 07/31/2020 CLINICAL DATA:  Left anterior hip replacement. EXAM: PORTABLE PELVIS 1-2 VIEWS COMPARISON:  Preoperative radiograph 04/25/2020 FINDINGS: Left hip arthroplasty in expected alignment. No periprosthetic lucency or fracture. Recent postsurgical change includes air and edema in the joint space and soft tissues. Lateral skin staples. Right hip arthroplasty is intact were visualized. IMPRESSION: Left hip arthroplasty without immediate postoperative complication. Electronically Signed   By: Narda Rutherford M.D.   On: 07/31/2020 15:10   DG C-Arm 1-60 Min  Result Date: 07/31/2020 CLINICAL DATA:  Surgery, elective. Additional history provided: Left total hip arthroplasty anterior approach. Provided FLUOROSCOPY TIME:  19.2 seconds (1.0832 mGy). EXAM: OPERATIVE left HIP (WITH PELVIS IF PERFORMED) 3 VIEWS  TECHNIQUE: Fluoroscopic spot image(s) were submitted for interpretation post-operatively. COMPARISON:  Radiographs of the hips 04/25/2020. FINDINGS: Three intraoperative fluoroscopic images of the hips are submitted, two of the left hip and one of the bilateral hips. There has been interval left total hip arthroplasty. The femoral and acetabular components appear well seated. No unexpected finding. Redemonstrated sequela of prior right total hip arthroplasty. IMPRESSION: Three intraoperative fluoroscopic images from left total hip arthroplasty. No unexpected finding on the provided views. Electronically Signed   By: Jackey Loge DO   On: 07/31/2020 14:17   DG HIP  OPERATIVE UNILAT W OR W/O PELVIS LEFT  Result Date: 07/31/2020 CLINICAL DATA:  Surgery, elective. Additional history provided: Left total hip arthroplasty anterior approach. Provided FLUOROSCOPY TIME:  19.2 seconds (1.0832 mGy). EXAM: OPERATIVE left HIP (WITH PELVIS IF PERFORMED) 3 VIEWS TECHNIQUE: Fluoroscopic spot image(s) were submitted for interpretation post-operatively. COMPARISON:  Radiographs of the hips 04/25/2020. FINDINGS: Three intraoperative fluoroscopic images of the hips are submitted, two of the left hip and one of the bilateral hips. There has been interval left total hip arthroplasty. The femoral and acetabular components appear well seated. No unexpected finding. Redemonstrated sequela of prior right total hip arthroplasty. IMPRESSION: Three intraoperative fluoroscopic images from left total hip arthroplasty. No unexpected finding on the provided views. Electronically Signed   By: Jackey Loge DO   On: 07/31/2020 14:17    Disposition: Discharge disposition: 01-Home or Self Care          Follow-up Information    Kathryne Hitch, MD Follow up in 2 week(s).   Specialty: Orthopedic Surgery Contact information: [address] [place] [address] [phone]                Signed: Kathryne Hitch 08/01/2020, 3:27 PM

## 2020-08-01 NOTE — Progress Notes (Signed)
Subjective: 1 Day Post-Op Procedure(s) (LRB): LEFT TOTAL HIP ARTHROPLASTY ANTERIOR APPROACH (Left) Patient reports pain as mild.  She is awake and alert this am and looks good overall.  Objective: Vital signs in last 24 hours: Temp:  [97.5 F (36.4 C)-98.4 F (36.9 C)] 97.9 F (36.6 C) (03/30 0359) Pulse Rate:  [68-116] 86 (03/30 0359) Resp:  [9-20] 20 (03/30 0359) BP: (103-146)/(51-88) 122/88 (03/30 0359) SpO2:  [94 %-96 %] 96 % (03/30 0359) Weight:  [78.9 kg] 78.9 kg (03/29 1007)  Intake/Output from previous day: 03/29 0701 - 03/30 0700 In: 1300 [I.V.:1100; IV Piggyback:200] Out: 2100 [Urine:2000; Blood:100] Intake/Output this shift: No intake/output data recorded.  No results for input(s): HGB in the last 72 hours. No results for input(s): WBC, RBC, HCT, PLT in the last 72 hours. No results for input(s): NA, K, CL, CO2, BUN, CREATININE, GLUCOSE, CALCIUM in the last 72 hours. No results for input(s): LABPT, INR in the last 72 hours.  Sensation intact distally Intact pulses distally Dorsiflexion/Plantar flexion intact Incision: dressing C/D/I   Assessment/Plan: 1 Day Post-Op Procedure(s) (LRB): LEFT TOTAL HIP ARTHROPLASTY ANTERIOR APPROACH (Left) Up with therapy Discharge home with home health this afternoon if clears PT.      Kathryne Hitch 08/01/2020, 7:24 AM

## 2020-08-01 NOTE — Progress Notes (Signed)
Physical Therapy Treatment Patient Details Name: Hannah Yu MRN: [id number] DOB: 05-29-1945 Today's Date: 08/01/2020    History of Present Illness Pt adm 3/29 for lt THR direct anterior. PMH - rt thr with hypoxic resp failure post op, htn, back pain, sciatica, arthritis    PT Comments    Focused this session on reviewing her HEP handout for TKR. Pt performed several reps of each exercise that was appropriate with cues. Pt needing AAROM with use of blanket/towel to assist with L heel slides though. Ambulated in hallway after exercises with pt noted to be a little shaky and quicker to fatigue than this morning, likely due to exercising beforehand. Will continue to follow acutely.     Follow Up Recommendations  Follow surgeon's recommendation for DC plan and follow-up therapies     Equipment Recommendations  None recommended by PT    Recommendations for Other Services       Precautions / Restrictions Precautions Precautions: Anterior Hip Precaution Booklet Issued: Yes (comment) Precaution Comments: Reviewed precautions and exercises with handouts Restrictions Weight Bearing Restrictions: No    Mobility  Bed Mobility Overal bed mobility: Needs Assistance Bed Mobility: Supine to Sit     Supine to sit: Min guard     General bed mobility comments: Pt hooking L leg with R to assist off EOB, extra time and use of rail to sit up with bed flat.    Transfers Overall transfer level: Needs assistance Equipment used: Rolling walker (2 wheeled) Transfers: Sit to/from Stand Sit to Stand: Min guard         General transfer comment: Assist for safety and verbal cues for hand placement  Ambulation/Gait Ambulation/Gait assistance: Min guard Gait Distance (Feet): 60 Feet Assistive device: Rolling walker (2 wheeled) Gait Pattern/deviations: Step-through pattern;Decreased step length - right;Decreased step length - left;Decreased stance time - left;Decreased weight shift to  left;Antalgic Gait velocity: decr Gait velocity interpretation: <1.31 ft/sec, indicative of household ambulator General Gait Details: Min guard for safety, but no overt LOB. Slow and slightly unsteady gait.   Stairs             Wheelchair Mobility    Modified Rankin (Stroke Patients Only)       Balance Overall balance assessment: No apparent balance deficits (not formally assessed)                                          Cognition Arousal/Alertness: Awake/alert Behavior During Therapy: WFL for tasks assessed/performed Overall Cognitive Status: Within Functional Limits for tasks assessed                                        Exercises Total Joint Exercises Ankle Circles/Pumps: Both;10 reps;Supine Quad Sets: Both;10 reps;Supine Short Arc Quad: Left;10 reps;Supine Heel Slides: Left;5 reps;Supine (with use of blanket on foot to assist with pull into flexion) Long Arc Quad: Both;10 reps;Seated Knee Flexion: Both;5 reps;Standing (with RW) Marching in Standing: Both;5 reps;Standing (with RW)    General Comments        Pertinent Vitals/Pain Pain Assessment: Faces Faces Pain Scale: Hurts little more Pain Location: lt hip Pain Descriptors / Indicators: Operative site guarding;Grimacing Pain Intervention(s): Limited activity within patient's tolerance;Monitored during session;Repositioned    Home Living  Prior Function            PT Goals (current goals can now be found in the care plan section) Acute Rehab PT Goals Patient Stated Goal: return home PT Goal Formulation: With patient Time For Goal Achievement: 08/07/20 Potential to Achieve Goals: Good Progress towards PT goals: Progressing toward goals    Frequency    7X/week      PT Plan Current plan remains appropriate    Co-evaluation              AM-PAC PT "6 Clicks" Mobility   Outcome Measure  Help needed turning from your  back to your side while in a flat bed without using bedrails?: A Little Help needed moving from lying on your back to sitting on the side of a flat bed without using bedrails?: A Little Help needed moving to and from a bed to a chair (including a wheelchair)?: A Little Help needed standing up from a chair using your arms (e.g., wheelchair or bedside chair)?: A Little Help needed to walk in hospital room?: A Little Help needed climbing 3-5 steps with a railing? : A Little 6 Click Score: 18    End of Session Equipment Utilized During Treatment: Gait belt Activity Tolerance: Patient tolerated treatment well Patient left: with call bell/phone within reach;in bed Nurse Communication: Mobility status PT Visit Diagnosis: Other abnormalities of gait and mobility (R26.89);Pain Pain - Right/Left: Left Pain - part of body: Hip     Time: 0350-0938 PT Time Calculation (min) (ACUTE ONLY): 19 min  Charges:  $Therapeutic Exercise: 8-22 mins                     Raymond Gurney, PT, DPT Acute Rehabilitation Services  Pager: [phone] Office: [phone]    Jewel Baize 08/01/2020, 2:45 PM

## 2020-08-02 ENCOUNTER — Encounter (HOSPITAL_COMMUNITY): Payer: Self-pay | Admitting: Orthopaedic Surgery

## 2020-08-08 ENCOUNTER — Other Ambulatory Visit: Payer: Self-pay | Admitting: Orthopaedic Surgery

## 2020-08-13 ENCOUNTER — Ambulatory Visit (INDEPENDENT_AMBULATORY_CARE_PROVIDER_SITE_OTHER): Payer: Medicare Other | Admitting: Orthopaedic Surgery

## 2020-08-13 ENCOUNTER — Encounter: Payer: Self-pay | Admitting: Orthopaedic Surgery

## 2020-08-13 DIAGNOSIS — Z96642 Presence of left artificial hip joint: Secondary | ICD-10-CM

## 2020-08-13 NOTE — Progress Notes (Signed)
The patient is 2 weeks status post a left total hip arthroplasty.  Actually replaced her right hip over a year ago.  She is 75 year old is ambulate with a cane and reports good range of motion and strength and she is pleased overall.  She has been on a baby aspirin twice a day.  She was on it once a day prior to surgery and will go back to once a day.  I did remove the staples in place Steri-Strips from her left hip incision.  She had a moderate seroma and I aspirated about 40 to 50 cc of fluid from the hip area.  She tolerated it well.  She will continue to increase her activities as comfort allows.  I will see her back in 4 weeks to see how she is doing overall but no x-rays are needed.

## 2020-09-10 ENCOUNTER — Encounter: Payer: Self-pay | Admitting: Orthopaedic Surgery

## 2020-09-10 ENCOUNTER — Ambulatory Visit (INDEPENDENT_AMBULATORY_CARE_PROVIDER_SITE_OTHER): Payer: Medicare Other | Admitting: Orthopaedic Surgery

## 2020-09-10 DIAGNOSIS — Z96642 Presence of left artificial hip joint: Secondary | ICD-10-CM

## 2020-09-10 DIAGNOSIS — Z96641 Presence of right artificial hip joint: Secondary | ICD-10-CM

## 2020-09-10 NOTE — Progress Notes (Signed)
The patient is now 6 weeks status post a left total hip arthroplasty in about a year status post a right total hip arthroplasty.  At 45 she feels like she is a little deconditioned but overall feels like she is doing great has good motion and strength.  She is walking without assistive device and has no complaints other than just generalized deconditioning.  She understands that she has no restrictions from my standpoint and I want her to increase activities as comfort allows.  Both hips move smoothly and fluidly with no issues at all.  I like to see her back in 6 months.  If there is any issues before then she knows to see [place] sooner.  At that visit I like a standing lady pelvis and lateral both hips.  All questions and concerns were answered and addressed.

## 2020-11-29 ENCOUNTER — Encounter: Payer: Self-pay | Admitting: Physical Therapy

## 2020-11-29 ENCOUNTER — Ambulatory Visit: Payer: Medicare Other | Attending: Sports Medicine | Admitting: Physical Therapy

## 2020-11-29 DIAGNOSIS — M25511 Pain in right shoulder: Secondary | ICD-10-CM | POA: Insufficient documentation

## 2020-11-29 DIAGNOSIS — G8929 Other chronic pain: Secondary | ICD-10-CM | POA: Insufficient documentation

## 2020-11-29 DIAGNOSIS — M25611 Stiffness of right shoulder, not elsewhere classified: Secondary | ICD-10-CM | POA: Diagnosis present

## 2020-11-29 DIAGNOSIS — M25551 Pain in right hip: Secondary | ICD-10-CM | POA: Diagnosis not present

## 2020-11-29 DIAGNOSIS — R262 Difficulty in walking, not elsewhere classified: Secondary | ICD-10-CM | POA: Insufficient documentation

## 2020-11-29 DIAGNOSIS — M6281 Muscle weakness (generalized): Secondary | ICD-10-CM | POA: Insufficient documentation

## 2020-11-29 NOTE — Addendum Note (Signed)
Addended by: Lawrence Marseilles on: 11/29/2020 12:12 PM   Modules accepted: Orders

## 2020-11-29 NOTE — Therapy (Signed)
[facility] [address] [place], [address] Phone: [phone]   Fax:  [phone]  Physical Therapy Evaluation  Patient Details  Name: Hannah Yu MRN: [id number] Date of Birth: [birth year] No data recorded  Encounter Date: 12/03/2020   PT End of Session - 12/03/20 1124     Visit Number 2    Number of Visits 17    Date for PT Re-Evaluation 01/25/21    Authorization Type medicare    Authorization Time Period of 10 progress report    Authorization - Visit Number 2    Authorization - Number of Visits 10    PT Start Time 1115    PT Stop Time 1155    PT Time Calculation (min) 40 min    Activity Tolerance Patient tolerated treatment well    Behavior During Therapy [facility] for tasks assessed/performed             Past Medical History:  Diagnosis Date   Acid reflux    Anesthesia complication    Tachycardia previously, now on metoprolol   Arthritis    09/02/2019: per patient "have it real bad in both hands and back"   Hypertension    Sciatica    09/02/2019: has had it for about 3 years    Past Surgical History:  Procedure Laterality Date   ABDOMINAL HYSTERECTOMY     CATARACT EXTRACTION W/ INTRAOCULAR LENS IMPLANT Bilateral 2007   eyes done within 1 month of each other.   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   TOTAL HIP ARTHROPLASTY Right 09/06/2019   Procedure: RIGHT TOTAL HIP ARTHROPLASTY ANTERIOR APPROACH;  Surgeon: Kathryne Hitch, MD;  Location: [facility] OR;  Service: Orthopedics;  Laterality: Right;   TOTAL HIP ARTHROPLASTY Left 07/31/2020   Procedure: LEFT TOTAL HIP ARTHROPLASTY ANTERIOR APPROACH;  Surgeon: Kathryne Hitch, MD;  Location: [facility] OR;  Service: Orthopedics;  Laterality: Left;    There were no vitals filed for this visit.    Subjective Assessment - 12/03/20 1120     Subjective Patient reports 7/10 pain  this am, reporting she may have worked her shoulder a little too hard. She feels as though her motion is about the same.    Pertinent History Patient reports to PT with upper R arm pain since getting her 2nd COVID pzier shot. she has swelling in her upper arm that she reports has been present since having her shot. She has pain right at this area (ant/mid delt) that comes and goes. Reports pain is present with moving her arm behind her, any overhead motion, lifitng >5#, pulling. No pain at rest, but can wake her up if she rolls onto her arm when she is sleeping, and has trouble getting back to sleep. Has 0/10 pain at rest, at least 7/10 with aggravating motions. Has tried several topical ointments without avail, reports heat helps momentarily, and ibprofen and tramadol help short term with her pain. Limited in activies such as driving, unable to play her hobby of cornhole, difficulty with washing her hair. Pt denies N/V, B&B changes, unexplained weight fluctuation, saddle paresthesia, fever, night sweats, or unrelenting night pain at this time.    Limitations Lifting;House hold activities    How long can you sit comfortably? unlimited    How long can you stand comfortably? unlimited    How long can you walk comfortably? unlimited    Diagnostic  tests MRI R shoulder unremarkable, did demonstrate swelling, [place] as well    Patient Stated Goals Be able to move my arm    Pain Onset More than a month ago                Ther-Ex Pulleys abd and flex x12 each 2sec hold Attempted shoulder abd band pull aparts, painful Bilat ER RTB with focus on scapular retraction into wall 2x 12 with patient able to comply with cuing for technique Prone scapular retraction + depression with lift 2x 12 Following manula techniques Supine PVC flexion x12  Shoulder rolls focus on scapular depression + retraction x12   Manual STM with trigger point release  to R UT/levator; following Dry Needling: (2) 19mm .25 needles  placed along the R UT to decrease increased muscular spasms and trigger points with the patient positioned in supine. Patient was educated on risks and benefits of therapy and verbally consents to PT.  G2 mob with movement all directions                        Objective measurements completed on examination: See above findings.               PT Education - 12/03/20 1123     Education provided Yes    Education Details HEP reivew, TDN    Person(s) Educated Patient    Methods Explanation;Demonstration;Verbal cues    Comprehension Verbalized understanding;Returned demonstration;Verbal cues required              PT Short Term Goals - 11/29/20 1157       PT SHORT TERM GOAL #1   Title Pt will be independent with HEP in order to improve strength and decrease pain in order to improve pain-free function at home and work.    Baseline 11/09/20 HEP given    Time 4    Period Weeks    Status New               PT Long Term Goals - 11/29/20 1157       PT LONG TERM GOAL #1   Title Pt will decrease worst pain as reported on NPRS by at least 3 points in order to demonstrate clinically significant reduction in pain.    Baseline 11/09/20 7/10    Time 8    Period Weeks    Status New      PT LONG TERM GOAL #2   Title Pt will demonstrate full active R shoulder ROM in order to complete household ADLs    Baseline 11/29/20 flex: 100d abd: 84d ER R ear IR T12    Time 8    Period Weeks    Status New      PT LONG TERM GOAL #3   Title Patient will improve her hip FOTO by at least 10 points as a demonstration of improved function.    Baseline 11/29/20 59    Time 8    Period Weeks    Status New      PT LONG TERM GOAL #4   Title Pt will demonstrate R shoulder and periscapular strength of at least 4+/5 in order to complete heavy household chores    Baseline 11/29/20 gross shoulder strength 2-/5; Y lower trap: 3/5; T scap retraction 4/5    Time 8    Period Weeks     Status New  Plan - 12/03/20 1158     Clinical Impression Statement PT initated therex and manual progression for increased shoulder mobility and increased scapulohumeral rhythm with success. Patient is able to demonstrate good compliance of all cuing for proper technique, with difficulty sequencing shoulder retraction + depression, with shoulder hiking at rest. Patient is demonstrating most deficit in ER, leaving possible adhesive capsulitis diagnosis availible. PT educated patient on AAROM exercises to let LUE aid in movement to prevent RUE muscle gaurding with success. PT will continue progression as able.    Personal Factors and Comorbidities Age;Comorbidity 3+;Fitness    Comorbidities HTN, arthritis, sciatica    Examination-Activity Limitations Bathing;Reach Overhead;Lift;Sleep;Carry;Hygiene/Grooming    Examination-Participation Restrictions Cleaning;Laundry;Community Activity;Driving    Stability/Clinical Decision Making Evolving/Moderate complexity    Clinical Decision Making Moderate    Rehab Potential Good    PT Frequency 2x / week    PT Duration 6 weeks    PT Treatment/Interventions Therapeutic activities;Therapeutic exercise;Balance training;Neuromuscular re-education;Patient/family education;Manual techniques;Dry needling;Aquatic Therapy;Electrical Stimulation;Iontophoresis 4mg /ml Dexamethasone;Gait training;Stair training;Passive range of motion;Joint Manipulations;Spinal Manipulations;Functional mobility training;ADLs/Self Care Home Management;Cryotherapy;Traction;Moist Heat;Ultrasound    PT Next Visit Plan TDN? aarom    PT Home Exercise Plan pulleys, scapular retractions, UT stretch, ice    Consulted and Agree with Plan of Care Patient             Patient will benefit from skilled therapeutic intervention in order to improve the following deficits and impairments:  Improper body mechanics, Decreased strength, Difficulty walking, Decreased  activity tolerance, Decreased coordination, Decreased safety awareness, Impaired flexibility, Impaired UE functional use, Postural dysfunction, Pain, Decreased range of motion, Decreased endurance, Decreased mobility, Impaired sensation  Visit Diagnosis: Chronic right shoulder pain - Plan: CANCELED: PT plan of care cert/re-cert  Stiffness of right shoulder, not elsewhere classified - Plan: CANCELED: PT plan of care cert/re-cert     Problem List Patient Active Problem List   Diagnosis Date Noted   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   Unilateral primary osteoarthritis, left hip 04/25/2020   Status post total replacement of right hip 09/06/2019   Pelvic pain in female 07/20/2019   Hyperlipidemia 07/20/2019   B12 deficiency 07/20/2019   Neuropathic pain of left hand 06/23/2017   Spinal stenosis of lumbosacral region 11/11/2016   Primary osteoarthritis of right hip 11/11/2016   Obesity (BMI 30.0-34.9) 09/12/2016   H/O supraventricular tachycardia 01/14/2016   Hypertension 12/24/2015   Back pain with right-sided sciatica 12/24/2015   Gallstone 10/19/2015   Biliary colic 10/19/2015   Diverticulitis 06/28/2014   06/30/2014 DPT Hilda Lias 12/03/2020, 12:23 PM  [place] [facility] [address], [address], [place] Phone: [phone]   Fax:  [phone]  Name: Hannah Yu MRN: Evalina Field Date of Birth: 04-26-1946

## 2020-11-29 NOTE — Therapy (Addendum)
[facility] [address] [place], [place], [address] Phone: [phone]   Fax:  [phone]  Physical Therapy Treatment  Patient Details  Name: Hannah FieldMargaret T Yu MRN: [id number] Date of Birth: 06/29/1945 No data recorded  Encounter Date: 11/29/2020   PT End of Session - 11/29/20 1154     Visit Number 1    Number of Visits 17    Date for PT Re-Evaluation 01/25/21    Authorization Type medicare    Authorization Time Period of 10 progress report    Authorization - Visit Number 1    Authorization - Number of Visits 10    PT Start Time 1030    PT Stop Time 1115    PT Time Calculation (min) 45 min    Equipment Utilized During Treatment Gait belt    Activity Tolerance Patient tolerated treatment well    Behavior During Therapy WFL for tasks assessed/performed             Past Medical History:  Diagnosis Date   Acid reflux    Anesthesia complication    Tachycardia previously, now on metoprolol   Arthritis    09/02/2019: per patient "have it real bad in both hands and back"   Hypertension    Sciatica    09/02/2019: has had it for about 3 years    Past Surgical History:  Procedure Laterality Date   ABDOMINAL HYSTERECTOMY     CATARACT EXTRACTION W/ INTRAOCULAR LENS IMPLANT Bilateral 2007   eyes done within 1 month of each other.   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   TOTAL HIP ARTHROPLASTY Right 09/06/2019   Procedure: RIGHT TOTAL HIP ARTHROPLASTY ANTERIOR APPROACH;  Surgeon: Kathryne HitchBlackman, Christopher Y, MD;  Location: [facility] OR;  Service: Orthopedics;  Laterality: Right;   TOTAL HIP ARTHROPLASTY Left 07/31/2020   Procedure: LEFT TOTAL HIP ARTHROPLASTY ANTERIOR APPROACH;  Surgeon: Kathryne HitchBlackman, Christopher Y, MD;  Location: [facility] OR;  Service: Orthopedics;  Laterality: Left;    There were no vitals filed for this visit.   Subjective Assessment - 11/29/20  1149     Subjective chronic R shoulder pain    Pertinent History Patient reports to PT with upper R arm pain since getting her 2nd COVID pzier shot. she has swelling in her upper arm that she reports has been present since having her shot. She has pain right at this area (ant/mid delt) that comes and goes. Reports pain is present with moving her arm behind her, any overhead motion, lifitng >5#, pulling. No pain at rest, but can wake her up if she rolls onto her arm when she is sleeping, and has trouble getting back to sleep. Has 0/10 pain at rest, at least 7/10 with aggravating motions. Has tried several topical ointments without avail, reports heat helps momentarily, and ibprofen and tramadol help short term with her pain. Limited in activies such as driving, unable to play her hobby of cornhole, difficulty with washing her hair. Pt denies N/V, B&B changes, unexplained weight fluctuation, saddle paresthesia, fever, night sweats, or unrelenting night pain at this time.    Limitations Lifting;House hold activities    How long can you sit comfortably? unlimited    How long can you stand comfortably? unlimited    How long can you walk comfortably? unlimited    Diagnostic tests MRI R shoulder unremarkable, did demonstrate swelling, US as well    Patient  Stated Goals Be able to move my arm    Currently in Pain? Yes    Pain Score 5     Pain Location Shoulder    Pain Orientation Right;Lateral    Pain Descriptors / Indicators Aching;Dull;Nagging    Pain Type Chronic pain    Pain Radiating Towards R side of the neck    Pain Onset More than a month ago    Pain Frequency Constant    Aggravating Factors  lifting, reaching behind or overhead, pulling, pushing, driving, playing cornhold    Pain Relieving Factors tylenol, heat, tramadol    Effect of Pain on Daily Activities unable to do household chores d/t pain               OBJECTIVE  MUSCULOSKELETAL: Tremor: Normal Bulk: Normal Tone:  Normal  Cervical Screen AROM: WFL; pain at R UT with bilat lateral flex and R rotation   Elbow Screen Elbow AROM: WNL  Palpation TTP along bicep muscle belly and at insertion. No pain to RTC except minimal to infraspinatus insertion. No pain to cervical paraspinals/occipitals. Tension with secondary pain to R UT and levator. Tension without pain to palpation at R lat and pec minor.   Strength R/L 5/2- Shoulder flexion (anterior deltoid/pec major/coracobrachialis, axillary n. (C5-6) and musculocutaneous n. (C5-7)) 5/2- Shoulder abduction (deltoid/supraspinatus, axillary/suprascapular n, C5) 5/2- Shoulder external rotation (infraspinatus/teres minor) 5/2- Shoulder internal rotation (subcapularis/lats/pec major) 5/2- Shoulder extension (posterior deltoid, lats, teres major, axillary/thoracodorsal n.) 5/4* Elbow flexion (biceps brachii, brachialis, brachioradialis, musculoskeletal n, C5-6) 5/5 Elbow extension (triceps, radial n, C7) 3 (within functional range)/4+ Y lower trap 4/5 T scapular retractors  AROM R/L 100/180 Shoulder flexion 84/180 Shoulder abduction R ear*/CTJ Shoulder external rotation T12*/T10 Shoulder internal rotation 60/60 Shoulder extension *Indicates pain, overpressure performed unless otherwise indicated  PROM R/L 121/180 Shoulder flexion 106/180 Shoulder abduction 47/>90 Shoulder external rotation 70/70 Shoulder internal rotation 60/60 Shoulder extension *Indicates pain, overpressure performed unless otherwise indicated  Accessory Motions/Glides Glenohumeral: All with empty end feels with pain   Acromioclavicular:  WNL minimal gaurding  Sternoclavicular: WNL   Scapulothoracic: WNL with more difficulty with medial movement, excessive lateral (protraction) movement  Muscle Length Testing Pectoralis Major: R: Shrtened bilat Biceps: R: Allows for full ROM with stretch/pain at end range L:WNL   NEUROLOGICAL:  Mental Status Patient is oriented  to person, place and time.  Recent memory is intact.  Remote memory is intact.  Attention span and concentration are intact.  Expressive speech is intact.  Patient's fund of knowledge is within normal limits for educational level.  Sensation Grossly intact to light touch bilateral UE as determined by testing dermatomes C2-T2 Proprioception and hot/cold testing deferred on this date   SPECIAL TESTS  Rotator Cuff  Drop Arm Test: Negative bilat Painful Arc (Pain from 60 to 120 degrees scaption): Positive 90d Infraspinatus Muscle Test: Negative  If all 3 tests positive, the probability of a full-thickness rotator cuff tear is 91%  Subacromial Impingement Hawkins-Kennedy: Positive Neer (Block scapula, PROM flexion): Positive Painful Arc (Pain from 60 to 120 degrees scaption): Positive Empty Can: Positive External Rotation Resistance: Positive Horizontal Adduction: Positive Scapular Assist: Positive P  Labral Tear Biceps Load II (120 elevation, full ER, 90 elbow flexion, full supination, resisted elbow flexion): Positive Crank (160 scaption, axial load with IR/ER): Negative Active Compression Test: Negative  Bicep Tendon Pathology Speed (shoulder flexion to 90, external rotation, full elbow extension, and forearm supination with resistance: Positive Yergason's (resisted shoulder ER and  supination/biceps tendon pathology): Positive  Shoulder Instability Sulcus Sign: Negative Anterior Apprehension: Negative  Outcome Measures Quick DASH:  SPADI:   Ther-Ex PT reviewed the following HEP with patient with patient able to demonstrate a set of the following with min cuing for correction needed. PT educated patient on parameters of therex (how/when to inc/decrease intensity, frequency, rep/set range, stretch hold time, and purpose of therex) with verbalized understanding.  Seated Shoulder Flexion AAROM with Pulley Behind - 2-3 x daily - 7 x weekly - 12-20 reps - 2sec hold Seated  Shoulder Abduction AAROM with Pulley Behind - 2-3 x daily - 7 x weekly - 12-20 reps - 2sec hold Seated Scapular Retraction - 2-3 x daily - 7 x weekly - 12-20 reps - 2sec hold Seated Upper Trapezius Stretch - 3 x daily - 7 x weekly - 30sec hold Ice 2x/day                      Objective measurements completed on examination: See above findings.                             PT Education - 11/29/20 1153     Education provided Yes    Education Details Patient was educated on diagnosis, anatomy and pathology involved, prognosis, role of PT, and was given an HEP, demonstrating exercise with proper form following verbal and tactile cues, and was given a paper hand out to continue exercise at home. Pt was educated on and agreed to plan of care.    Person(s) Educated Patient    Methods Explanation;Demonstration;Verbal cues    Comprehension Verbalized understanding;Returned demonstration;Verbal cues required              PT Short Term Goals - 11/29/20 1157       PT SHORT TERM GOAL #1   Title Pt will be independent with HEP in order to improve strength and decrease pain in order to improve pain-free function at home and work.    Baseline 11/09/20 HEP given    Time 4    Period Weeks    Status New               PT Long Term Goals - 11/29/20 1157       PT LONG TERM GOAL #1   Title Pt will decrease worst pain as reported on NPRS by at least 3 points in order to demonstrate clinically significant reduction in pain.    Baseline 11/09/20 7/10    Time 8    Period Weeks    Status New      PT LONG TERM GOAL #2   Title Pt will demonstrate full active R shoulder ROM in order to complete household ADLs    Baseline 11/29/20 flex: 100d abd: 84d ER R ear IR T12    Time 8    Period Weeks    Status New      PT LONG TERM GOAL #3   Title Patient will improve her hip FOTO by at least 10 points as a demonstration of improved function.    Baseline  11/29/20 59    Time 8    Period Weeks    Status New      PT LONG TERM GOAL #4   Title Pt will demonstrate R shoulder and periscapular strength of at least 4+/5 in order to complete heavy household chores    Baseline 11/29/20 gross shoulder  strength 2-/5; Y lower trap: 3/5; T scap retraction 4/5    Time 8    Period Weeks    Status New                   Plan - 11/29/20 1155     Clinical Impression Statement Pt is a 75 year old female familiar with this clinic, presenting with R shoulder/upper arm pain. Signs and symptoms consistent with subacromial impingement with bicep tendon involvement; although adhesive capsulitis cannot be ruled out at this time. Impairments in decreased A/PROM, decreased shoulder and periscapular strength, postural deficits, decreased motor control, decreased activity tolerance and pain. Activity limitations in inability to lift, reach overhead, reach behind back, pull and push;inhibiting participation in painfree community and self care ADLs. Pt will benefit from skilled PT to address aforementioned impairments to best return to PLOF.    Personal Factors and Comorbidities Age;Comorbidity 3+;Fitness    Comorbidities HTN, arthritis, sciatica    Examination-Activity Limitations Bathing;Reach Overhead;Lift;Sleep;Carry;Hygiene/Grooming    Examination-Participation Restrictions Cleaning;Laundry;Community Activity;Driving    Stability/Clinical Decision Making Evolving/Moderate complexity    Clinical Decision Making Moderate    Rehab Potential Good    PT Frequency 2x / week    PT Duration 6 weeks    PT Treatment/Interventions Therapeutic activities;Therapeutic exercise;Balance training;Neuromuscular re-education;Patient/family education;Manual techniques;Dry needling;Aquatic Therapy;Electrical Stimulation;Iontophoresis 4mg /ml Dexamethasone;Gait training;Stair training;Passive range of motion;Joint Manipulations;Spinal Manipulations;Functional mobility  training;ADLs/Self Care Home Management;Cryotherapy;Traction;Moist Heat;Ultrasound    PT Next Visit Plan TDN? aarom    PT Home Exercise Plan pulleys, scapular retractions, UT stretch, ice    Consulted and Agree with Plan of Care Patient             Patient will benefit from skilled therapeutic intervention in order to improve the following deficits and impairments:  Improper body mechanics, Decreased strength, Difficulty walking, Decreased activity tolerance, Decreased coordination, Decreased safety awareness, Impaired flexibility, Impaired UE functional use, Postural dysfunction, Pain, Decreased range of motion, Decreased endurance, Decreased mobility, Impaired sensation  Visit Diagnosis: Chronic right shoulder pain  Difficulty in walking, not elsewhere classified - Plan: PT plan of care cert/re-cert  Stiffness of right shoulder, not elsewhere classified     Problem List Patient Active Problem List   Diagnosis Date Noted   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   Unilateral primary osteoarthritis, left hip 04/25/2020   Status post total replacement of right hip 09/06/2019   Pelvic pain in female 07/20/2019   Hyperlipidemia 07/20/2019   B12 deficiency 07/20/2019   Neuropathic pain of left hand 06/23/2017   Spinal stenosis of lumbosacral region 11/11/2016   Primary osteoarthritis of right hip 11/11/2016   Obesity (BMI 30.0-34.9) 09/12/2016   H/O supraventricular tachycardia 01/14/2016   Hypertension 12/24/2015   Back pain with right-sided sciatica 12/24/2015   Gallstone 10/19/2015   Biliary colic 10/19/2015   Diverticulitis 06/28/2014   06/30/2014 DPT Hilda Lias 11/29/2020, 12:10 PM  [facility] [address], [address], [place] Phone: [phone]   Fax:  [phone]  Name: NAI Yu MRN: Hannah Yu Date of Birth: 1945/08/31

## 2020-12-03 ENCOUNTER — Ambulatory Visit: Payer: Medicare Other | Attending: Sports Medicine | Admitting: Physical Therapy

## 2020-12-03 ENCOUNTER — Encounter: Payer: Self-pay | Admitting: Physical Therapy

## 2020-12-03 DIAGNOSIS — M25611 Stiffness of right shoulder, not elsewhere classified: Secondary | ICD-10-CM | POA: Insufficient documentation

## 2020-12-03 DIAGNOSIS — G8929 Other chronic pain: Secondary | ICD-10-CM | POA: Diagnosis present

## 2020-12-03 DIAGNOSIS — M6281 Muscle weakness (generalized): Secondary | ICD-10-CM | POA: Diagnosis present

## 2020-12-03 DIAGNOSIS — M25511 Pain in right shoulder: Secondary | ICD-10-CM | POA: Diagnosis not present

## 2020-12-05 ENCOUNTER — Encounter: Payer: Self-pay | Admitting: Physical Therapy

## 2020-12-05 ENCOUNTER — Ambulatory Visit: Payer: Medicare Other | Admitting: Physical Therapy

## 2020-12-05 DIAGNOSIS — M25611 Stiffness of right shoulder, not elsewhere classified: Secondary | ICD-10-CM

## 2020-12-05 DIAGNOSIS — M25511 Pain in right shoulder: Secondary | ICD-10-CM | POA: Diagnosis not present

## 2020-12-05 DIAGNOSIS — G8929 Other chronic pain: Secondary | ICD-10-CM

## 2020-12-05 NOTE — Therapy (Signed)
[facility] [address] [place], [address] Phone: [phone]   Fax:  [phone]  Physical Therapy Treatment  Patient Details  Name: Hannah Yu MRN: [id number] Date of Birth: 1946-01-31 No data recorded  Encounter Date: 12/05/2020   PT End of Session - 12/05/20 1422     Visit Number 3    Number of Visits 17    Date for PT Re-Evaluation 01/25/21    Authorization Type medicare    Authorization Time Period of 10 progress report    Authorization - Visit Number 3    Authorization - Number of Visits 10    PT Start Time 1345    PT Stop Time 1425    PT Time Calculation (min) 40 min    Equipment Utilized During Treatment Gait belt    Activity Tolerance Patient tolerated treatment well    Behavior During Therapy WFL for tasks assessed/performed             Past Medical History:  Diagnosis Date   Acid reflux    Anesthesia complication    Tachycardia previously, now on metoprolol   Arthritis    09/02/2019: per patient "have it real bad in both hands and back"   Hypertension    Sciatica    09/02/2019: has had it for about 3 years    Past Surgical History:  Procedure Laterality Date   ABDOMINAL HYSTERECTOMY     CATARACT EXTRACTION W/ INTRAOCULAR LENS IMPLANT Bilateral 2007   eyes done within 1 month of each other.   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   TOTAL HIP ARTHROPLASTY Right 09/06/2019   Procedure: RIGHT TOTAL HIP ARTHROPLASTY ANTERIOR APPROACH;  Surgeon: Kathryne Hitch, MD;  Location: [facility] OR;  Service: Orthopedics;  Laterality: Right;   TOTAL HIP ARTHROPLASTY Left 07/31/2020   Procedure: LEFT TOTAL HIP ARTHROPLASTY ANTERIOR APPROACH;  Surgeon: Kathryne Hitch, MD;  Location: [facility] OR;  Service: Orthopedics;  Laterality: Left;    There were no vitals filed for this visit.   Subjective Assessment - 12/05/20  1348     Subjective Patient reports she is doing better overall, thinks TDN helped last time. Thinks her motion is better, pain 5/10 today. IS completing HEP with no questions or concerns.    Pertinent History Patient reports to PT with upper R arm pain since getting her 2nd COVID pzier shot. she has swelling in her upper arm that she reports has been present since having her shot. She has pain right at this area (ant/mid delt) that comes and goes. Reports pain is present with moving her arm behind her, any overhead motion, lifitng >5#, pulling. No pain at rest, but can wake her up if she rolls onto her arm when she is sleeping, and has trouble getting back to sleep. Has 0/10 pain at rest, at least 7/10 with aggravating motions. Has tried several topical ointments without avail, reports heat helps momentarily, and ibprofen and tramadol help short term with her pain. Limited in activies such as driving, unable to play her hobby of cornhole, difficulty with washing her hair. Pt denies N/V, B&B changes, unexplained weight fluctuation, saddle paresthesia, fever, night sweats, or unrelenting night pain at this time.    Limitations Lifting;House hold activities    How long can you sit comfortably? unlimited    How long can you stand comfortably? unlimited    How long  can you walk comfortably? unlimited    Diagnostic tests MRI R shoulder unremarkable, did demonstrate swelling, [place] as well    Patient Stated Goals Be able to move my arm    Pain Onset More than a month ago                Ther-Ex Pulleys abd and flex x12 each 2sec hold Standing eccentric bicep curl 5# 2x 8 with cuing for true eccentric with full elbow ext with good carry over Lat pull down 15# 2x 10  with good carry over of demo Supine PVC flexion x12 5# AW  with cuing to maintain elbow ext with good carry over Shoulder rolls focus on scapular depression + retraction x12    Manual STM with trigger point release  to R UT/levator;  following Dry Needling: (2) 25mm .25 needles placed along the R UT to decrease increased muscular spasms and trigger points with the patient positioned in supine. Patient was educated on risks and benefits of therapy and verbally consents to PT. G2 mob with movement all directions                        PT Education - 12/05/20 1422     Education provided Yes    Education Details TDN, therex form/technique    Person(s) Educated Patient    Methods Explanation;Demonstration;Verbal cues    Comprehension Verbalized understanding;Returned demonstration;Verbal cues required              PT Short Term Goals - 11/29/20 1157       PT SHORT TERM GOAL #1   Title Pt will be independent with HEP in order to improve strength and decrease pain in order to improve pain-free function at home and work.    Baseline 11/09/20 HEP given    Time 4    Period Weeks    Status New               PT Long Term Goals - 11/29/20 1157       PT LONG TERM GOAL #1   Title Pt will decrease worst pain as reported on NPRS by at least 3 points in order to demonstrate clinically significant reduction in pain.    Baseline 11/09/20 7/10    Time 8    Period Weeks    Status New      PT LONG TERM GOAL #2   Title Pt will demonstrate full active R shoulder ROM in order to complete household ADLs    Baseline 11/29/20 flex: 100d abd: 84d ER R ear IR T12    Time 8    Period Weeks    Status New      PT LONG TERM GOAL #3   Title Patient will improve her hip FOTO by at least 10 points as a demonstration of improved function.    Baseline 11/29/20 59    Time 8    Period Weeks    Status New      PT LONG TERM GOAL #4   Title Pt will demonstrate R shoulder and periscapular strength of at least 4+/5 in order to complete heavy household chores    Baseline 11/29/20 gross shoulder strength 2-/5; Y lower trap: 3/5; T scap retraction 4/5    Time 8    Period Weeks    Status New                    Plan -  12/05/20 1449     Clinical Impression Statement PT continued therex progression for increased shoulder mobility, decreased muscle tension, and increased strength of overhead movers with success. Paitent is able to comply with all cuing for proper technique of therex with good motivation and no increased pain throughout session. Paitent demonstrates increased motion at the end of session. PT continued to utilize TDN to decrease muscle tension with success, local twitch response elicited. PT will continue progression as able.    Personal Factors and Comorbidities Age;Comorbidity 3+;Fitness    Comorbidities HTN, arthritis, sciatica    Examination-Activity Limitations Bathing;Reach Overhead;Lift;Sleep;Carry;Hygiene/Grooming    Examination-Participation Restrictions Cleaning;Laundry;Community Activity;Driving    Stability/Clinical Decision Making Evolving/Moderate complexity    Clinical Decision Making Moderate    Rehab Potential Good    PT Frequency 2x / week    PT Duration 6 weeks    PT Treatment/Interventions Therapeutic activities;Therapeutic exercise;Balance training;Neuromuscular re-education;Patient/family education;Manual techniques;Dry needling;Aquatic Therapy;Electrical Stimulation;Iontophoresis 4mg /ml Dexamethasone;Gait training;Stair training;Passive range of motion;Joint Manipulations;Spinal Manipulations;Functional mobility training;ADLs/Self Care Home Management;Cryotherapy;Traction;Moist Heat;Ultrasound    PT Next Visit Plan cont POC    PT Home Exercise Plan pulleys, scapular retractions, UT stretch, ice    Consulted and Agree with Plan of Care Patient             Patient will benefit from skilled therapeutic intervention in order to improve the following deficits and impairments:  Improper body mechanics, Decreased strength, Difficulty walking, Decreased activity tolerance, Decreased coordination, Decreased safety awareness, Impaired flexibility, Impaired UE  functional use, Postural dysfunction, Pain, Decreased range of motion, Decreased endurance, Decreased mobility, Impaired sensation  Visit Diagnosis: Chronic right shoulder pain  Stiffness of right shoulder, not elsewhere classified     Problem List Patient Active Problem List   Diagnosis Date Noted   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   Unilateral primary osteoarthritis, left hip 04/25/2020   Status post total replacement of right hip 09/06/2019   Pelvic pain in female 07/20/2019   Hyperlipidemia 07/20/2019   B12 deficiency 07/20/2019   Neuropathic pain of left hand 06/23/2017   Spinal stenosis of lumbosacral region 11/11/2016   Primary osteoarthritis of right hip 11/11/2016   Obesity (BMI 30.0-34.9) 09/12/2016   H/O supraventricular tachycardia 01/14/2016   Hypertension 12/24/2015   Back pain with right-sided sciatica 12/24/2015   Gallstone 10/19/2015   Biliary colic 10/19/2015   Diverticulitis 06/28/2014   06/30/2014 DPT Hilda Lias 12/05/2020, 2:52 PM  [facility] [address], [place] Phone: [phone]   Fax:  [phone]  Name: Hannah Yu MRN: Evalina Field Date of Birth: 1945/10/16

## 2020-12-06 ENCOUNTER — Encounter: Payer: Medicare Other | Admitting: Physical Therapy

## 2020-12-10 ENCOUNTER — Ambulatory Visit: Payer: Medicare Other | Admitting: Physical Therapy

## 2020-12-10 ENCOUNTER — Encounter: Payer: Self-pay | Admitting: Physical Therapy

## 2020-12-10 DIAGNOSIS — M25611 Stiffness of right shoulder, not elsewhere classified: Secondary | ICD-10-CM

## 2020-12-10 DIAGNOSIS — M25511 Pain in right shoulder: Secondary | ICD-10-CM

## 2020-12-10 DIAGNOSIS — G8929 Other chronic pain: Secondary | ICD-10-CM

## 2020-12-10 NOTE — Therapy (Signed)
[facility] [address] [place], [address] Phone: [phone]   Fax:  [phone]  Physical Therapy Treatment  Patient Details  Name: KAHLYN SHIPPEY MRN: [id number] Date of Birth: May 10, 1945 No data recorded  Encounter Date: 12/10/2020   PT End of Session - 12/10/20 1433     Visit Number 4    Number of Visits 17    Date for PT Re-Evaluation 01/25/21    Authorization - Visit Number 4    Authorization - Number of Visits 10    PT Start Time 1430    PT Stop Time 1310    PT Time Calculation (min) 1360 min    Activity Tolerance Patient tolerated treatment well    Behavior During Therapy [facility] for tasks assessed/performed             Past Medical History:  Diagnosis Date   Acid reflux    Anesthesia complication    Tachycardia previously, now on metoprolol   Arthritis    09/02/2019: per patient "have it real bad in both hands and back"   Hypertension    Sciatica    09/02/2019: has had it for about 3 years    Past Surgical History:  Procedure Laterality Date   ABDOMINAL HYSTERECTOMY     CATARACT EXTRACTION W/ INTRAOCULAR LENS IMPLANT Bilateral 2007   eyes done within 1 month of each other.   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   TOTAL HIP ARTHROPLASTY Right 09/06/2019   Procedure: RIGHT TOTAL HIP ARTHROPLASTY ANTERIOR APPROACH;  Surgeon: Kathryne Hitch, MD;  Location: [facility] OR;  Service: Orthopedics;  Laterality: Right;   TOTAL HIP ARTHROPLASTY Left 07/31/2020   Procedure: LEFT TOTAL HIP ARTHROPLASTY ANTERIOR APPROACH;  Surgeon: Kathryne Hitch, MD;  Location: [facility] OR;  Service: Orthopedics;  Laterality: Left;    There were no vitals filed for this visit.   Subjective Assessment - 12/10/20 1432     Subjective Patient reports Saturday her shoulder woke her up in the morning extra painful, but has gotten better since.  She thinks her mobility is improving, currently having 6/10 pain.    Pertinent History Patient reports to PT with upper R arm pain since getting her 2nd COVID pzier shot. she has swelling in her upper arm that she reports has been present since having her shot. She has pain right at this area (ant/mid delt) that comes and goes. Reports pain is present with moving her arm behind her, any overhead motion, lifitng >5#, pulling. No pain at rest, but can wake her up if she rolls onto her arm when she is sleeping, and has trouble getting back to sleep. Has 0/10 pain at rest, at least 7/10 with aggravating motions. Has tried several topical ointments without avail, reports heat helps momentarily, and ibprofen and tramadol help short term with her pain. Limited in activies such as driving, unable to play her hobby of cornhole, difficulty with washing her hair. Pt denies N/V, B&B changes, unexplained weight fluctuation, saddle paresthesia, fever, night sweats, or unrelenting night pain at this time.    Limitations Lifting;House hold activities    How long can you sit comfortably? unlimited    How long can you stand comfortably? unlimited    How long can you walk comfortably? unlimited    Diagnostic tests MRI R shoulder unremarkable, did demonstrate swelling, [place] as well    Patient Stated  Goals Be able to move my arm    Pain Onset More than a month ago              Ther-Ex Pulleys abd and flex x12 each 2sec hold Lat pull over PVC pipe 5# 2x 8 with cuing for normalized flexion mechanics with scaption/prevention of shoulder hiking with success Supine skull crusher 4# DB bilat with cuing for form, good carry over following Standing eccentric bicep curl 5# 2x 10 with cuing for true eccentric with full elbow ext with good carry over Low rows (scapular depression +retraction with maintained elbow ext) GTB 2x 10 with cuing for technique without shoulder hiking and with eccentric control with good carry over    Manual STM with trigger point release  to R UT/levator, and pec minor (sig tenderness, and trigger points) G2 mob with movement all directions                            PT Education - 12/10/20 1433     Education provided Yes    Education Details therex form/technique    Person(s) Educated Patient    Methods Explanation;Demonstration;Verbal cues    Comprehension Verbalized understanding;Returned demonstration;Verbal cues required              PT Short Term Goals - 11/29/20 1157       PT SHORT TERM GOAL #1   Title Pt will be independent with HEP in order to improve strength and decrease pain in order to improve pain-free function at home and work.    Baseline 11/09/20 HEP given    Time 4    Period Weeks    Status New               PT Long Term Goals - 11/29/20 1157       PT LONG TERM GOAL #1   Title Pt will decrease worst pain as reported on NPRS by at least 3 points in order to demonstrate clinically significant reduction in pain.    Baseline 11/09/20 7/10    Time 8    Period Weeks    Status New      PT LONG TERM GOAL #2   Title Pt will demonstrate full active R shoulder ROM in order to complete household ADLs    Baseline 11/29/20 flex: 100d abd: 84d ER R ear IR T12    Time 8    Period Weeks    Status New      PT LONG TERM GOAL #3   Title Patient will improve her hip FOTO by at least 10 points as a demonstration of improved function.    Baseline 11/29/20 59    Time 8    Period Weeks    Status New      PT LONG TERM GOAL #4   Title Pt will demonstrate R shoulder and periscapular strength of at least 4+/5 in order to complete heavy household chores    Baseline 11/29/20 gross shoulder strength 2-/5; Y lower trap: 3/5; T scap retraction 4/5    Time 8    Period Weeks    Status New                   Plan - 12/10/20 1451     Clinical Impression Statement PT continued therex progression and manual techniques for increased  shoulder mobility and scapulohumeral rhythm with success. Following manual techniques paitent with full ER/IR ROM, and  lacking approx 20d of overhead abd and flex. Patient with contiued difficulty with active mobility, though this is also improving. Pt requires multimodal cuing to comply with instruction for scapulohumeral rhythm with overhead motion. Pt is motivated throughout session with minimal increased pain. PT will continue progression as able.    Personal Factors and Comorbidities Age;Comorbidity 3+;Fitness    Comorbidities HTN, arthritis, sciatica    Examination-Activity Limitations Bathing;Reach Overhead;Lift;Sleep;Carry;Hygiene/Grooming    Examination-Participation Restrictions Cleaning;Laundry;Community Activity;Driving    Stability/Clinical Decision Making Evolving/Moderate complexity    Clinical Decision Making Moderate    Rehab Potential Good    PT Frequency 2x / week    PT Duration 6 weeks    PT Treatment/Interventions Therapeutic activities;Therapeutic exercise;Balance training;Neuromuscular re-education;Patient/family education;Manual techniques;Dry needling;Aquatic Therapy;Electrical Stimulation;Iontophoresis 4mg /ml Dexamethasone;Gait training;Stair training;Passive range of motion;Joint Manipulations;Spinal Manipulations;Functional mobility training;ADLs/Self Care Home Management;Cryotherapy;Traction;Moist Heat;Ultrasound    PT Next Visit Plan cont POC    PT Home Exercise Plan pulleys, scapular retractions, UT stretch, ice    Consulted and Agree with Plan of Care Patient             Patient will benefit from skilled therapeutic intervention in order to improve the following deficits and impairments:  Improper body mechanics, Decreased strength, Difficulty walking, Decreased activity tolerance, Decreased coordination, Decreased safety awareness, Impaired flexibility, Impaired UE functional use, Postural dysfunction, Pain, Decreased range of motion, Decreased endurance,  Decreased mobility, Impaired sensation  Visit Diagnosis: Chronic right shoulder pain  Stiffness of right shoulder, not elsewhere classified     Problem List Patient Active Problem List   Diagnosis Date Noted   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   Unilateral primary osteoarthritis, left hip 04/25/2020   Status post total replacement of right hip 09/06/2019   Pelvic pain in female 07/20/2019   Hyperlipidemia 07/20/2019   B12 deficiency 07/20/2019   Neuropathic pain of left hand 06/23/2017   Spinal stenosis of lumbosacral region 11/11/2016   Primary osteoarthritis of right hip 11/11/2016   Obesity (BMI 30.0-34.9) 09/12/2016   H/O supraventricular tachycardia 01/14/2016   Hypertension 12/24/2015   Back pain with right-sided sciatica 12/24/2015   Gallstone 10/19/2015   Biliary colic 10/19/2015   Diverticulitis 06/28/2014   06/30/2014 DPT Hilda Lias 12/10/2020, 3:45 PM  [place] [facility] [address], [place] Phone: [phone]   Fax:  [phone]  Name: EVELINE SAUVE MRN: Evalina Field Date of Birth: [birth year]

## 2020-12-12 ENCOUNTER — Ambulatory Visit: Payer: Medicare Other

## 2020-12-12 DIAGNOSIS — M6281 Muscle weakness (generalized): Secondary | ICD-10-CM

## 2020-12-12 DIAGNOSIS — G8929 Other chronic pain: Secondary | ICD-10-CM

## 2020-12-12 DIAGNOSIS — M25611 Stiffness of right shoulder, not elsewhere classified: Secondary | ICD-10-CM

## 2020-12-12 DIAGNOSIS — M25511 Pain in right shoulder: Secondary | ICD-10-CM | POA: Diagnosis not present

## 2020-12-12 NOTE — Therapy (Signed)
[facility] [address] [place], [address] Phone: [phone]   Fax:  [phone]  Physical Therapy Treatment  Patient Details  Name: Hannah Yu MRN: [id number] Date of Birth: 1945-07-10 No data recorded  Encounter Date: 12/12/2020   PT End of Session - 12/12/20 1347     Visit Number 5    Number of Visits 17    Date for PT Re-Evaluation 01/25/21    Authorization - Visit Number 5    Authorization - Number of Visits 10    PT Start Time 1344    PT Stop Time 1426    PT Time Calculation (min) 42 min    Activity Tolerance Patient tolerated treatment well    Behavior During Therapy [facility] for tasks assessed/performed             Past Medical History:  Diagnosis Date   Acid reflux    Anesthesia complication    Tachycardia previously, now on metoprolol   Arthritis    09/02/2019: per patient "have it real bad in both hands and back"   Hypertension    Sciatica    09/02/2019: has had it for about 3 years    Past Surgical History:  Procedure Laterality Date   ABDOMINAL HYSTERECTOMY     CATARACT EXTRACTION W/ INTRAOCULAR LENS IMPLANT Bilateral 2007   eyes done within 1 month of each other.   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   TOTAL HIP ARTHROPLASTY Right 09/06/2019   Procedure: RIGHT TOTAL HIP ARTHROPLASTY ANTERIOR APPROACH;  Surgeon: Kathryne Hitch, MD;  Location: [facility] OR;  Service: Orthopedics;  Laterality: Right;   TOTAL HIP ARTHROPLASTY Left 07/31/2020   Procedure: LEFT TOTAL HIP ARTHROPLASTY ANTERIOR APPROACH;  Surgeon: Kathryne Hitch, MD;  Location: [facility] OR;  Service: Orthopedics;  Laterality: Left;    There were no vitals filed for this visit.   Subjective Assessment - 12/12/20 1345     Subjective Pt reports no pain when not moving RUE overhead. Pt reports R shoulder is 6/10 NPS.    Pertinent History  Patient reports to PT with upper R arm pain since getting her 2nd COVID pzier shot. she has swelling in her upper arm that she reports has been present since having her shot. She has pain right at this area (ant/mid delt) that comes and goes. Reports pain is present with moving her arm behind her, any overhead motion, lifitng >5#, pulling. No pain at rest, but can wake her up if she rolls onto her arm when she is sleeping, and has trouble getting back to sleep. Has 0/10 pain at rest, at least 7/10 with aggravating motions. Has tried several topical ointments without avail, reports heat helps momentarily, and ibprofen and tramadol help short term with her pain. Limited in activies such as driving, unable to play her hobby of cornhole, difficulty with washing her hair. Pt denies N/V, B&B changes, unexplained weight fluctuation, saddle paresthesia, fever, night sweats, or unrelenting night pain at this time.    Limitations Lifting;House hold activities    How long can you sit comfortably? unlimited    How long can you stand comfortably? unlimited    How long can you walk comfortably? unlimited    Diagnostic tests MRI R shoulder unremarkable, did demonstrate swelling, [place] as well    Patient Stated Goals Be able to move my arm    Currently in  Pain? Yes    Pain Score 6     Pain Location Shoulder    Pain Orientation Right;Lateral    Pain Descriptors / Indicators Aching    Pain Type Chronic pain    Pain Onset More than a month ago            There.exMyrtha Mantis flex/abd/scaption: 1x12 with 2 sec holds.  Seated lat pull down at OMEGA: 3x15, 15 lbs  Seated scap retractions at OMEGA: 2x12, 15 lbs   Seated bicep curl focus on eccentric portion: 6# DB, 2x8  Supine exercises:        Skull crushers 4# DB: 2x10/UE. Max TC's at elbow to stabilize to ensure tricep activation.   Chest press with 4# DB's: 2x10. VC's for UE placement to improve comfort of pressing overhead. Good carryover on second set with UE  adjustment with form (scapular plane).    Manual Therapy: 10 min spent with pt in supine- utilized to improve pain and shoulder mobility  5x30 sec R UT stretch + L cervical rotation and overpressure on R shoulder  Suboccipital release: 2x30 sec   STM for 5 min on R UT musculature with AAROM cervical L rotation + side bending   Pt subjectively reports improvement in shoulder/neck tightness after manual therapy. Pain remains near baseline.    PT Education - 12/12/20 1436     Education provided Yes    Education Details form/technique with exercise.    Person(s) Educated Patient    Methods Explanation;Demonstration;Verbal cues    Comprehension Verbalized understanding;Returned demonstration              PT Short Term Goals - 11/29/20 1157       PT SHORT TERM GOAL #1   Title Pt will be independent with HEP in order to improve strength and decrease pain in order to improve pain-free function at home and work.    Baseline 11/09/20 HEP given    Time 4    Period Weeks    Status New               PT Long Term Goals - 11/29/20 1157       PT LONG TERM GOAL #1   Title Pt will decrease worst pain as reported on NPRS by at least 3 points in order to demonstrate clinically significant reduction in pain.    Baseline 11/09/20 7/10    Time 8    Period Weeks    Status New      PT LONG TERM GOAL #2   Title Pt will demonstrate full active R shoulder ROM in order to complete household ADLs    Baseline 11/29/20 flex: 100d abd: 84d ER R ear IR T12    Time 8    Period Weeks    Status New      PT LONG TERM GOAL #3   Title Patient will improve her hip FOTO by at least 10 points as a demonstration of improved function.    Baseline 11/29/20 59    Time 8    Period Weeks    Status New      PT LONG TERM GOAL #4   Title Pt will demonstrate R shoulder and periscapular strength of at least 4+/5 in order to complete heavy household chores    Baseline 11/29/20 gross shoulder strength 2-/5; Y  lower trap: 3/5; T scap retraction 4/5    Time 8    Period Weeks    Status New  Plan - 12/12/20 1437     Clinical Impression Statement Continuing to follow primary PT POC of focus on progressive R shoulder strenghtening and mobility. Pt remains limited in AROM flexion and needed multimodal cuing throughout session for correct form/technique to targeted musculature. Pt remains motivated to perform overhead therex. PT will continue to be beneficial to optimize R shoulder overhead mobility and strength.    Personal Factors and Comorbidities Age;Comorbidity 3+;Fitness    Comorbidities HTN, arthritis, sciatica    Examination-Activity Limitations Bathing;Reach Overhead;Lift;Sleep;Carry;Hygiene/Grooming    Examination-Participation Restrictions Cleaning;Laundry;Community Activity;Driving    Stability/Clinical Decision Making Evolving/Moderate complexity    Rehab Potential Good    PT Frequency 2x / week    PT Duration 6 weeks    PT Treatment/Interventions Therapeutic activities;Therapeutic exercise;Balance training;Neuromuscular re-education;Patient/family education;Manual techniques;Dry needling;Aquatic Therapy;Electrical Stimulation;Iontophoresis 4mg /ml Dexamethasone;Gait training;Stair training;Passive range of motion;Joint Manipulations;Spinal Manipulations;Functional mobility training;ADLs/Self Care Home Management;Cryotherapy;Traction;Moist Heat;Ultrasound    PT Next Visit Plan cont POC    PT Home Exercise Plan pulleys, scapular retractions, UT stretch, ice    Consulted and Agree with Plan of Care Patient             Patient will benefit from skilled therapeutic intervention in order to improve the following deficits and impairments:  Improper body mechanics, Decreased strength, Difficulty walking, Decreased activity tolerance, Decreased coordination, Decreased safety awareness, Impaired flexibility, Impaired UE functional use, Postural dysfunction, Pain,  Decreased range of motion, Decreased endurance, Decreased mobility, Impaired sensation  Visit Diagnosis: Chronic right shoulder pain  Stiffness of right shoulder, not elsewhere classified  Muscle weakness (generalized)     Problem List Patient Active Problem List   Diagnosis Date Noted   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   Unilateral primary osteoarthritis, left hip 04/25/2020   Status post total replacement of right hip 09/06/2019   Pelvic pain in female 07/20/2019   Hyperlipidemia 07/20/2019   B12 deficiency 07/20/2019   Neuropathic pain of left hand 06/23/2017   Spinal stenosis of lumbosacral region 11/11/2016   Primary osteoarthritis of right hip 11/11/2016   Obesity (BMI 30.0-34.9) 09/12/2016   H/O supraventricular tachycardia 01/14/2016   Hypertension 12/24/2015   Back pain with right-sided sciatica 12/24/2015   Gallstone 10/19/2015   Biliary colic 10/19/2015   Diverticulitis 06/28/2014    06/30/2014. Fairly IV, PT, DPT Physical Therapist- [place]  [facility]  12/12/2020, 2:48 PM   [facility] PHYSICAL AND SPORTS MEDICINE [address], [address], [place] Phone: [phone]   Fax:  [phone]  Name: Hannah Yu MRN: Evalina Field Date of Birth: 04-30-1946

## 2020-12-17 ENCOUNTER — Ambulatory Visit: Payer: Self-pay

## 2020-12-17 ENCOUNTER — Ambulatory Visit (INDEPENDENT_AMBULATORY_CARE_PROVIDER_SITE_OTHER): Payer: Medicare Other | Admitting: Physician Assistant

## 2020-12-17 ENCOUNTER — Encounter: Payer: Self-pay | Admitting: Physician Assistant

## 2020-12-17 ENCOUNTER — Ambulatory Visit (INDEPENDENT_AMBULATORY_CARE_PROVIDER_SITE_OTHER): Payer: Medicare Other

## 2020-12-17 ENCOUNTER — Other Ambulatory Visit: Payer: Self-pay

## 2020-12-17 DIAGNOSIS — Z96641 Presence of right artificial hip joint: Secondary | ICD-10-CM

## 2020-12-17 DIAGNOSIS — M7062 Trochanteric bursitis, left hip: Secondary | ICD-10-CM | POA: Diagnosis not present

## 2020-12-17 DIAGNOSIS — Z96642 Presence of left artificial hip joint: Secondary | ICD-10-CM

## 2020-12-17 DIAGNOSIS — M7061 Trochanteric bursitis, right hip: Secondary | ICD-10-CM

## 2020-12-17 NOTE — Addendum Note (Signed)
Addended by: Barbette Or on: 12/17/2020 04:37 PM   Modules accepted: Orders

## 2020-12-17 NOTE — Progress Notes (Signed)
HPI: Hannah Yu comes in today due to bilateral hip pain.  She has history of right total hip arthroplasty 09/06/2019 left total hip on 07/31/2020.  She has had no new injury.  She is been having pain left hip over the last few weeks.  Pains at night is on the side of the hip.  Has some discomfort on the right.  No numbness tingling down either leg.  Review of systems Denies any fevers or chills.  Physical exam: Bilateral hips excellent range of motion without pain.  Tenderness over the trochanteric region of both hips with deep palpation.  Negative straight leg raise bilaterally.  Ambulates without any assistive device.  Radiographs: AP pelvis lateral view of both hips: Shows well-seated total hip arthroplasty components.  No acute fractures bony abnormalities.   Impression: Bilateral trochanteric bursitis  Plan: Offered patient trochanteric injection on the left she defers.  Therefore we will send her to formal physical therapy for IT band stretching bilaterally, home exercise program, and modalities.  She will follow-up with [place] in November for the bilateral hip replacement.  No radiographs at that time unless clinically indicated.

## 2020-12-19 ENCOUNTER — Encounter: Payer: Self-pay | Admitting: Physical Therapy

## 2020-12-19 ENCOUNTER — Ambulatory Visit: Payer: Medicare Other | Admitting: Physical Therapy

## 2020-12-19 DIAGNOSIS — M25511 Pain in right shoulder: Secondary | ICD-10-CM | POA: Diagnosis not present

## 2020-12-19 DIAGNOSIS — M25611 Stiffness of right shoulder, not elsewhere classified: Secondary | ICD-10-CM

## 2020-12-19 DIAGNOSIS — G8929 Other chronic pain: Secondary | ICD-10-CM

## 2020-12-19 NOTE — Therapy (Signed)
[facility] [address], [place], [address] Phone: [phone]   Fax:  [phone]  Physical Therapy Treatment  Patient Details  Name: Hannah Yu MRN: [id number] Date of Birth: [birth year] No data recorded  Encounter Date: 12/19/2020   PT End of Session - 12/19/20 1602     Visit Number 6    Number of Visits 17    Date for PT Re-Evaluation 01/25/21    Authorization Type medicare    Authorization Time Period of 10 progress report    Authorization - Visit Number 6    Authorization - Number of Visits 10    PT Start Time 0317    PT Stop Time 0356    PT Time Calculation (min) 39 min    Equipment Utilized During Treatment Gait belt    Activity Tolerance Patient tolerated treatment well    Behavior During Therapy [facility] for tasks assessed/performed             Past Medical History:  Diagnosis Date   Acid reflux    Anesthesia complication    Tachycardia previously, now on metoprolol   Arthritis    09/02/2019: per patient "have it real bad in both hands and back"   Hypertension    Sciatica    09/02/2019: has had it for about 3 years    Past Surgical History:  Procedure Laterality Date   ABDOMINAL HYSTERECTOMY     CATARACT EXTRACTION W/ INTRAOCULAR LENS IMPLANT Bilateral 2007   eyes done within 1 month of each other.   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   TOTAL HIP ARTHROPLASTY Right 09/06/2019   Procedure: RIGHT TOTAL HIP ARTHROPLASTY ANTERIOR APPROACH;  Surgeon: Kathryne Hitch, MD;  Location: [facility] OR;  Service: Orthopedics;  Laterality: Right;   TOTAL HIP ARTHROPLASTY Left 07/31/2020   Procedure: LEFT TOTAL HIP ARTHROPLASTY ANTERIOR APPROACH;  Surgeon: Kathryne Hitch, MD;  Location: [facility] OR;  Service: Orthopedics;  Laterality: Left;    There were no vitals filed for this visit.   Subjective Assessment - 12/19/20  1521     Subjective Pt reports increased R shoulder pain that she thinks is due to a UTI that she has currently that is increasing her overall pain. Reports R shoulder/neck pain is 8/10 today reporting majority of pain at R UT. Pt also has an order for her R hip that she had replaced in March.    Pertinent History Patient reports to PT with upper R arm pain since getting her 2nd COVID pzier shot. she has swelling in her upper arm that she reports has been present since having her shot. She has pain right at this area (ant/mid delt) that comes and goes. Reports pain is present with moving her arm behind her, any overhead motion, lifitng >5#, pulling. No pain at rest, but can wake her up if she rolls onto her arm when she is sleeping, and has trouble getting back to sleep. Has 0/10 pain at rest, at least 7/10 with aggravating motions. Has tried several topical ointments without avail, reports heat helps momentarily, and ibprofen and tramadol help short term with her pain. Limited in activies such as driving, unable to play her hobby of cornhole, difficulty with washing her hair. Pt denies N/V, B&B changes, unexplained weight fluctuation, saddle paresthesia, fever, night sweats, or unrelenting night pain at this time.    Limitations Lifting;House hold activities  How long can you sit comfortably? unlimited    How long can you stand comfortably? unlimited    How long can you walk comfortably? unlimited    Diagnostic tests MRI R shoulder unremarkable, did demonstrate swelling, [place] as well    Patient Stated Goals Be able to move my arm    Pain Onset More than a month ago               Ther-Ex Pulleys abd and flex x12 each 2sec hold Scapular punches 3# DB supine 2x 10 with good carry over of cuing for technique Lat pull over 3# DB in each hand x8; 2# x10 with good carry over of cuing for scapular retraction/depression with overhead motion Supine skull crusher 4# DB bilat with cuing for form, good  carry over following Y on wall 2x 6 with difficulty maintaining elbow ext Bicep stretch on wall 30sec   Manual STM with trigger point release  to R UT/levator, and pec minor (sig tenderness, and trigger points) Dry Needling: (2) 6mm .25 needles placed along the R UT to decrease increased muscular spasms and trigger points with the patient positioned in supine. Patient was educated on risks and benefits of therapy and verbally consents to PT.   G2 mob with movement all directions                                PT Education - 12/19/20 1602     Education provided Yes    Education Details therex form/technique, TDN    Person(s) Educated Patient    Methods Explanation;Demonstration;Verbal cues    Comprehension Verbalized understanding;Returned demonstration;Verbal cues required              PT Short Term Goals - 11/29/20 1157       PT SHORT TERM GOAL #1   Title Pt will be independent with HEP in order to improve strength and decrease pain in order to improve pain-free function at home and work.    Baseline 11/09/20 HEP given    Time 4    Period Weeks    Status New               PT Long Term Goals - 11/29/20 1157       PT LONG TERM GOAL #1   Title Pt will decrease worst pain as reported on NPRS by at least 3 points in order to demonstrate clinically significant reduction in pain.    Baseline 11/09/20 7/10    Time 8    Period Weeks    Status New      PT LONG TERM GOAL #2   Title Pt will demonstrate full active R shoulder ROM in order to complete household ADLs    Baseline 11/29/20 flex: 100d abd: 84d ER R ear IR T12    Time 8    Period Weeks    Status New      PT LONG TERM GOAL #3   Title Patient will improve her hip FOTO by at least 10 points as a demonstration of improved function.    Baseline 11/29/20 59    Time 8    Period Weeks    Status New      PT LONG TERM GOAL #4   Title Pt will demonstrate R shoulder and periscapular strength of  at least 4+/5 in order to complete heavy household chores    Baseline 11/29/20 gross shoulder  strength 2-/5; Y lower trap: 3/5; T scap retraction 4/5    Time 8    Period Weeks    Status New                   Plan - 12/19/20 1610     Clinical Impression Statement PT continued therex progression for increased shoulder mobility and scapulohumeral rhythm with success. 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 PT reached back out to [facility] DO for further follow up of mass. Patient is able to comply with all cuing for proper technique of therex with good motivation throughout session. PT added bicep stretch to HEP.PT will continue progression as able    Personal Factors and Comorbidities Age;Comorbidity 3+;Fitness    Comorbidities HTN, arthritis, sciatica    Examination-Activity Limitations Bathing;Reach Overhead;Lift;Sleep;Carry;Hygiene/Grooming    Examination-Participation Restrictions Cleaning;Laundry;Community Activity;Driving    Stability/Clinical Decision Making Evolving/Moderate complexity    Clinical Decision Making Moderate    Rehab Potential Good    PT Frequency 2x / week    PT Duration 6 weeks    PT Treatment/Interventions Therapeutic activities;Therapeutic exercise;Balance training;Neuromuscular re-education;Patient/family education;Manual techniques;Dry needling;Aquatic Therapy;Electrical Stimulation;Iontophoresis 4mg /ml Dexamethasone;Gait training;Stair training;Passive range of motion;Joint Manipulations;Spinal Manipulations;Functional mobility training;ADLs/Self Care Home Management;Cryotherapy;Traction;Moist Heat;Ultrasound    PT Next Visit Plan cont POC    PT Home Exercise Plan pulleys, scapular retractions, UT stretch, ice    Consulted and Agree with Plan of Care Patient             Patient will benefit from skilled therapeutic intervention in order to improve the following deficits and  impairments:  Improper body mechanics, Decreased strength, Difficulty walking, Decreased activity tolerance, Decreased coordination, Decreased safety awareness, Impaired flexibility, Impaired UE functional use, Postural dysfunction, Pain, Decreased range of motion, Decreased endurance, Decreased mobility, Impaired sensation  Visit Diagnosis: Chronic right shoulder pain  Stiffness of right shoulder, not elsewhere classified     Problem List Patient Active Problem List   Diagnosis Date Noted   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   Unilateral primary osteoarthritis, left hip 04/25/2020   Status post total replacement of right hip 09/06/2019   Pelvic pain in female 07/20/2019   Hyperlipidemia 07/20/2019   B12 deficiency 07/20/2019   Neuropathic pain of left hand 06/23/2017   Spinal stenosis of lumbosacral region 11/11/2016   Primary osteoarthritis of right hip 11/11/2016   Obesity (BMI 30.0-34.9) 09/12/2016   H/O supraventricular tachycardia 01/14/2016   Hypertension 12/24/2015   Back pain with right-sided sciatica 12/24/2015   Gallstone 10/19/2015   Biliary colic 10/19/2015   Diverticulitis 06/28/2014   06/30/2014 DPT Hilda Lias 12/19/2020, 4:27 PM  [facility] [address], [place] Phone: [phone]   Fax:  [phone]  Name: Hannah Yu MRN: Evalina Field Date of Birth: 10/02/1945

## 2020-12-26 ENCOUNTER — Ambulatory Visit: Payer: Medicare Other | Admitting: Physical Therapy

## 2020-12-26 ENCOUNTER — Encounter: Payer: Self-pay | Admitting: Physical Therapy

## 2020-12-26 DIAGNOSIS — M25511 Pain in right shoulder: Secondary | ICD-10-CM | POA: Diagnosis not present

## 2020-12-26 DIAGNOSIS — M25611 Stiffness of right shoulder, not elsewhere classified: Secondary | ICD-10-CM

## 2020-12-26 DIAGNOSIS — M6281 Muscle weakness (generalized): Secondary | ICD-10-CM

## 2020-12-26 NOTE — Therapy (Signed)
[facility] [address] [place], [address] Phone: [phone]   Fax:  [phone]  Physical Therapy Treatment  Patient Details  Name: Hannah Yu MRN: [id number] Date of Birth: January 28, 1946 No data recorded  Encounter Date: 12/26/2020     Past Medical History:  Diagnosis Date   Acid reflux    Anesthesia complication    Tachycardia previously, now on metoprolol   Arthritis    09/02/2019: per patient "have it real bad in both hands and back"   Hypertension    Sciatica    09/02/2019: has had it for about 3 years    Past Surgical History:  Procedure Laterality Date   ABDOMINAL HYSTERECTOMY     CATARACT EXTRACTION W/ INTRAOCULAR LENS IMPLANT Bilateral 2007   eyes done within 1 month of each other.   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   TOTAL HIP ARTHROPLASTY Right 09/06/2019   Procedure: RIGHT TOTAL HIP ARTHROPLASTY ANTERIOR APPROACH;  Surgeon: Kathryne Hitch, MD;  Location: [facility] OR;  Service: Orthopedics;  Laterality: Right;   TOTAL HIP ARTHROPLASTY Left 07/31/2020   Procedure: LEFT TOTAL HIP ARTHROPLASTY ANTERIOR APPROACH;  Surgeon: Kathryne Hitch, MD;  Location: [facility] OR;  Service: Orthopedics;  Laterality: Left;    There were no vitals filed for this visit.        Ther-Ex Pulleys abd and flex x12 each 2sec hold  Overhead wall slides with foam roller and yellow tband ABD 2 x 10 reps   Standing shoulder ext YTB 2 x 10 reps  Standing Row 1 x 8 reps with reports of pain in bicep with flexion  Omega Eccentric Bicep Curl 5# 2 x 8 reps VC and demonstration to pull up with L arm and lower with R arm  Supine Chest Press 2 x 10 reps with 4# cueing to   Pec Minor Doorway stretch 2 x 30 sec: VC for UEs at lower position  Manual Therapy   Passive cervical extensor stretch 2 x 30 sec  Passive R Upper Trap  stretch 2 x 30 sec  STM to R Upper trap Trp  R Glenohumeral mobilizations P-A 3 bouts x 30 sec   to reduce tissue tension, improve R shoulder range of motion, neuromodulation, in order to promote improved ability to complete functional activities                      PT Short Term Goals - 11/29/20 1157       PT SHORT TERM GOAL #1   Title Pt will be independent with HEP in order to improve strength and decrease pain in order to improve pain-free function at home and work.    Baseline 11/09/20 HEP given    Time 4    Period Weeks    Status New               PT Long Term Goals - 11/29/20 1157       PT LONG TERM GOAL #1   Title Pt will decrease worst pain as reported on NPRS by at least 3 points in order to demonstrate clinically significant reduction in pain.    Baseline 11/09/20 7/10    Time 8    Period Weeks    Status New      PT LONG TERM GOAL #2   Title Pt will demonstrate full active R shoulder  ROM in order to complete household ADLs    Baseline 11/29/20 flex: 100d abd: 84d ER R ear IR T12    Time 8    Period Weeks    Status New      PT LONG TERM GOAL #3   Title Patient will improve her hip FOTO by at least 10 points as a demonstration of improved function.    Baseline 11/29/20 59    Time 8    Period Weeks    Status New      PT LONG TERM GOAL #4   Title Pt will demonstrate R shoulder and periscapular strength of at least 4+/5 in order to complete heavy household chores    Baseline 11/29/20 gross shoulder strength 2-/5; Y lower trap: 3/5; T scap retraction 4/5    Time 8    Period Weeks    Status New                     Patient will benefit from skilled therapeutic intervention in order to improve the following deficits and impairments:  Improper body mechanics, Decreased strength, Difficulty walking, Decreased activity tolerance, Decreased coordination, Decreased safety awareness, Impaired flexibility, Impaired UE functional use,  Postural dysfunction, Pain, Decreased range of motion, Decreased endurance, Decreased mobility, Impaired sensation  Visit Diagnosis: Stiffness of right shoulder, not elsewhere classified  Muscle weakness (generalized)     Problem List Patient Active Problem List   Diagnosis Date Noted   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   Unilateral primary osteoarthritis, left hip 04/25/2020   Status post total replacement of right hip 09/06/2019   Pelvic pain in female 07/20/2019   Hyperlipidemia 07/20/2019   B12 deficiency 07/20/2019   Neuropathic pain of left hand 06/23/2017   Spinal stenosis of lumbosacral region 11/11/2016   Primary osteoarthritis of right hip 11/11/2016   Obesity (BMI 30.0-34.9) 09/12/2016   H/O supraventricular tachycardia 01/14/2016   Hypertension 12/24/2015   Back pain with right-sided sciatica 12/24/2015   Gallstone 10/19/2015   Biliary colic 10/19/2015   Diverticulitis 06/28/2014    Hilda Lias DPT  Romilda Joy, SPT  Hilda Lias 12/28/2020, 8:55 AM  [place] [facility] [address], [place], [address] Phone: [phone]   Fax:  [phone]  Name: Hannah Yu MRN: [id number] Date of Birth: 06/04/1945

## 2021-01-01 ENCOUNTER — Ambulatory Visit: Payer: Medicare Other | Admitting: Physical Therapy

## 2021-01-01 ENCOUNTER — Encounter: Payer: Self-pay | Admitting: Physical Therapy

## 2021-01-01 DIAGNOSIS — M6281 Muscle weakness (generalized): Secondary | ICD-10-CM

## 2021-01-01 DIAGNOSIS — M25611 Stiffness of right shoulder, not elsewhere classified: Secondary | ICD-10-CM

## 2021-01-01 DIAGNOSIS — M25511 Pain in right shoulder: Secondary | ICD-10-CM | POA: Diagnosis not present

## 2021-01-01 NOTE — Therapy (Signed)
[facility] [facility] [address] [place], [address] Phone: [phone]   Fax:  [phone]  Physical Therapy Treatment  Patient Details  Name: Hannah Yu MRN: [id number] Date of Birth: 06/16/1945 No data recorded  Encounter Date: 01/01/2021   PT End of Session - 01/01/21 1628     Visit Number 8    Number of Visits 17    Date for PT Re-Evaluation 01/25/21    Authorization Type medicare    Authorization Time Period of 10 progress report    Authorization - Visit Number 8    Authorization - Number of Visits 10    PT Start Time 1600    PT Stop Time 1640    PT Time Calculation (min) 40 min    Equipment Utilized During Treatment Gait belt    Activity Tolerance Patient tolerated treatment well    Behavior During Therapy [facility] for tasks assessed/performed             Past Medical History:  Diagnosis Date   Acid reflux    Anesthesia complication    Tachycardia previously, now on metoprolol   Arthritis    09/02/2019: per patient "have it real bad in both hands and back"   Hypertension    Sciatica    09/02/2019: has had it for about 3 years    Past Surgical History:  Procedure Laterality Date   ABDOMINAL HYSTERECTOMY     CATARACT EXTRACTION W/ INTRAOCULAR LENS IMPLANT Bilateral 2007   eyes done within 1 month of each other.   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   TOTAL HIP ARTHROPLASTY Right 09/06/2019   Procedure: RIGHT TOTAL HIP ARTHROPLASTY ANTERIOR APPROACH;  Surgeon: Kathryne Hitch, MD;  Location: [facility];  Service: Orthopedics;  Laterality: Right;   TOTAL HIP ARTHROPLASTY Left 07/31/2020   Procedure: LEFT TOTAL HIP ARTHROPLASTY ANTERIOR APPROACH;  Surgeon: Kathryne Hitch, MD;  Location: [facility] OR;  Service: Orthopedics;  Laterality: Left;    There were no vitals filed for this visit.   Subjective Assessment - 01/01/21  1604     Subjective Patient reports she saw MD, did not feel like they were understanding that her pain is only in the mass on her upper arm, does not feel like it is in the shoulder. THe pain is not at rest anymore, only with movement. She does think her motion is improving. She will be seen for another ultrasound after 6 PT visits.    Pertinent History Patient reports to PT with upper R arm pain since getting her 2nd COVID pzier shot. she has swelling in her upper arm that she reports has been present since having her shot. She has pain right at this area (ant/mid delt) that comes and goes. Reports pain is present with moving her arm behind her, any overhead motion, lifitng >5#, pulling. No pain at rest, but can wake her up if she rolls onto her arm when she is sleeping, and has trouble getting back to sleep. Has 0/10 pain at rest, at least 7/10 with aggravating motions. Has tried several topical ointments without avail, reports heat helps momentarily, and ibprofen and tramadol help short term with her pain. Limited in activies such as driving, unable to play her hobby of cornhole, difficulty with washing her hair. Pt denies N/V, B&B changes, unexplained weight fluctuation, saddle paresthesia, fever, night sweats, or unrelenting night pain at this  time.    Limitations Lifting;House hold activities    How long can you sit comfortably? unlimited    How long can you stand comfortably? unlimited    How long can you walk comfortably? unlimited    Diagnostic tests MRI R shoulder unremarkable, did demonstrate swelling, [place] as well    Patient Stated Goals Be able to move my arm    Currently in Pain? Yes    Pain Onset More than a month ago              Ther-Ex Pulleys abd and flex x12 each 2sec hold   Supine lat pull over 7# AW on PVC 3x 10 with min cuing for set up for appropriate arthrokinematics    Standing supinated row 20# 2x 8 with good carry over of cuing for set up  Chest press (OMEGA)  slight overhead 20# 2x 8 with min cuing for eccentric control   Pec Minor Doorway stretch 30 sec: VC for UEs at lower position   Manual Therapy   STM to R Upper trap Trp, pec minor, and bicep muscle belly Lymphatic massage of upper arm fluid   R Glenohumeral mobilizations P-A 3 bouts x 30 sec    to reduce tissue tension, improve R shoulder range of motion, neuromodulation, in order to promote improved ability to complete functional activities                           PT Education - 01/01/21 1627     Education provided Yes    Education Details therex form/technique    Person(s) Educated Patient    Methods Explanation;Demonstration;Verbal cues    Comprehension Verbalized understanding;Returned demonstration;Verbal cues required              PT Short Term Goals - 11/29/20 1157       PT SHORT TERM GOAL #1   Title Pt will be independent with HEP in order to improve strength and decrease pain in order to improve pain-free function at home and work.    Baseline 11/09/20 HEP given    Time 4    Period Weeks    Status New               PT Long Term Goals - 11/29/20 1157       PT LONG TERM GOAL #1   Title Pt will decrease worst pain as reported on NPRS by at least 3 points in order to demonstrate clinically significant reduction in pain.    Baseline 11/09/20 7/10    Time 8    Period Weeks    Status New      PT LONG TERM GOAL #2   Title Pt will demonstrate full active R shoulder ROM in order to complete household ADLs    Baseline 11/29/20 flex: 100d abd: 84d ER R ear IR T12    Time 8    Period Weeks    Status New      PT LONG TERM GOAL #3   Title Patient will improve her hip FOTO by at least 10 points as a demonstration of improved function.    Baseline 11/29/20 59    Time 8    Period Weeks    Status New      PT LONG TERM GOAL #4   Title Pt will demonstrate R shoulder and periscapular strength of at least 4+/5 in order to complete heavy  household chores  Baseline 11/29/20 gross shoulder strength 2-/5; Y lower trap: 3/5; T scap retraction 4/5    Time 8    Period Weeks    Status New                   Plan - 01/01/21 1641     Clinical Impression Statement PT continued therex progression for increased scapulohumeral rhythm, strength, and mobility with success. Patient is able to comply with all cuing for proper technique of therex, with good motivation throughout session. Patient continues to respond well to manual techniques with overhead mobility to functional, but not full range following. PT will continue progression as able.    Personal Factors and Comorbidities Age;Comorbidity 3+;Fitness    Comorbidities HTN, arthritis, sciatica    Examination-Activity Limitations Bathing;Reach Overhead;Lift;Sleep;Carry;Hygiene/Grooming    Examination-Participation Restrictions Cleaning;Laundry;Community Activity;Driving    Stability/Clinical Decision Making Evolving/Moderate complexity    Clinical Decision Making Moderate    Rehab Potential Good    PT Frequency 2x / week    PT Duration 6 weeks    PT Treatment/Interventions Therapeutic activities;Therapeutic exercise;Balance training;Neuromuscular re-education;Patient/family education;Manual techniques;Dry needling;Aquatic Therapy;Electrical Stimulation;Iontophoresis 4mg /ml Dexamethasone;Gait training;Stair training;Passive range of motion;Joint Manipulations;Spinal Manipulations;Functional mobility training;ADLs/Self Care Home Management;Cryotherapy;Traction;Moist Heat;Ultrasound    PT Next Visit Plan cont POC    PT Home Exercise Plan pulleys, scapular retractions, UT stretch, ice    Consulted and Agree with Plan of Care Patient             Patient will benefit from skilled therapeutic intervention in order to improve the following deficits and impairments:  Improper body mechanics, Decreased strength, Difficulty walking, Decreased activity tolerance, Decreased  coordination, Decreased safety awareness, Impaired flexibility, Impaired UE functional use, Postural dysfunction, Pain, Decreased range of motion, Decreased endurance, Decreased mobility, Impaired sensation  Visit Diagnosis: Stiffness of right shoulder, not elsewhere classified  Muscle weakness (generalized)     Problem List Patient Active Problem List   Diagnosis Date Noted   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   Unilateral primary osteoarthritis, left hip 04/25/2020   Status post total replacement of right hip 09/06/2019   Pelvic pain in female 07/20/2019   Hyperlipidemia 07/20/2019   B12 deficiency 07/20/2019   Neuropathic pain of left hand 06/23/2017   Spinal stenosis of lumbosacral region 11/11/2016   Primary osteoarthritis of right hip 11/11/2016   Obesity (BMI 30.0-34.9) 09/12/2016   H/O supraventricular tachycardia 01/14/2016   Hypertension 12/24/2015   Back pain with right-sided sciatica 12/24/2015   Gallstone 10/19/2015   Biliary colic 10/19/2015   Diverticulitis 06/28/2014   06/30/2014 DPT Hilda Lias 01/01/2021, 4:45 PM  [facility] [address], [place] Phone: [phone]   Fax:  [phone]  Name: MYESHIA FOJTIK MRN: Evalina Field Date of Birth: 1946-03-22

## 2021-01-03 ENCOUNTER — Ambulatory Visit: Payer: Medicare Other | Attending: Sports Medicine | Admitting: Physical Therapy

## 2021-01-03 ENCOUNTER — Encounter: Payer: Self-pay | Admitting: Physical Therapy

## 2021-01-03 DIAGNOSIS — R269 Unspecified abnormalities of gait and mobility: Secondary | ICD-10-CM | POA: Diagnosis present

## 2021-01-03 DIAGNOSIS — M25611 Stiffness of right shoulder, not elsewhere classified: Secondary | ICD-10-CM | POA: Diagnosis present

## 2021-01-03 DIAGNOSIS — M25551 Pain in right hip: Secondary | ICD-10-CM | POA: Insufficient documentation

## 2021-01-03 DIAGNOSIS — M6281 Muscle weakness (generalized): Secondary | ICD-10-CM | POA: Diagnosis present

## 2021-01-03 DIAGNOSIS — M25552 Pain in left hip: Secondary | ICD-10-CM | POA: Diagnosis present

## 2021-01-03 NOTE — Therapy (Signed)
[facility] [address] [place], [address] Phone: [phone]   Fax:  [phone]  Physical Therapy Treatment  Patient Details  Name: Hannah Yu MRN: [id number] Date of Birth: Jul 03, 1945 No data recorded  Encounter Date: 01/03/2021   PT End of Session - 01/03/21 1554     Visit Number 9    Number of Visits 17    Date for PT Re-Evaluation 01/25/21    Authorization Type medicare    Authorization - Visit Number 9    Authorization - Number of Visits 10    PT Start Time 1559    PT Stop Time 1638    PT Time Calculation (min) 39 min    Equipment Utilized During Treatment Gait belt    Activity Tolerance Patient tolerated treatment well    Behavior During Therapy [facility] for tasks assessed/performed             Past Medical History:  Diagnosis Date   Acid reflux    Anesthesia complication    Tachycardia previously, now on metoprolol   Arthritis    09/02/2019: per patient "have it real bad in both hands and back"   Hypertension    Sciatica    09/02/2019: has had it for about 3 years    Past Surgical History:  Procedure Laterality Date   ABDOMINAL HYSTERECTOMY     CATARACT EXTRACTION W/ INTRAOCULAR LENS IMPLANT Bilateral 2007   eyes done within 1 month of each other.   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   TOTAL HIP ARTHROPLASTY Right 09/06/2019   Procedure: RIGHT TOTAL HIP ARTHROPLASTY ANTERIOR APPROACH;  Surgeon: Kathryne Hitch, MD;  Location: [facility];  Service: Orthopedics;  Laterality: Right;   TOTAL HIP ARTHROPLASTY Left 07/31/2020   Procedure: LEFT TOTAL HIP ARTHROPLASTY ANTERIOR APPROACH;  Surgeon: Kathryne Hitch, MD;  Location: [facility] OR;  Service: Orthopedics;  Laterality: Left;    There were no vitals filed for this visit.   Subjective Assessment - 01/03/21 1601     Subjective Pt reports her shoulder is  doing better, she has not had a lot of pain today. Thinks motion is steadily improving. Still has mass in her upper arm, that she does not feel like is reducing.    Pertinent History Patient reports to PT with upper R arm pain since getting her 2nd COVID pzier shot. she has swelling in her upper arm that she reports has been present since having her shot. She has pain right at this area (ant/mid delt) that comes and goes. Reports pain is present with moving her arm behind her, any overhead motion, lifitng >5#, pulling. No pain at rest, but can wake her up if she rolls onto her arm when she is sleeping, and has trouble getting back to sleep. Has 0/10 pain at rest, at least 7/10 with aggravating motions. Has tried several topical ointments without avail, reports heat helps momentarily, and ibprofen and tramadol help short term with her pain. Limited in activies such as driving, unable to play her hobby of cornhole, difficulty with washing her hair. Pt denies N/V, B&B changes, unexplained weight fluctuation, saddle paresthesia, fever, night sweats, or unrelenting night pain at this time.    Limitations Lifting;House hold activities    How long can you sit comfortably? unlimited    How long can you stand comfortably? unlimited    How long can you  walk comfortably? unlimited    Diagnostic tests MRI R shoulder unremarkable, did demonstrate swelling, [place] as well    Patient Stated Goals Be able to move my arm    Pain Onset More than a month ago             Ther-Ex Pulleys abd and flex x12 each 2sec hold   Supine pull downs 20# 3x 10/9/8 with min cuing initially for eccentric control and scapulohumeral rhythm with good carry over   Chest press (OMEGA) slight overhead 20# 2x 8 with min cuing for eccentric control  Seated military press 2# 3x 8/7/6 with min cuing for posture, difficulty with combined scapular retraction and shoulder ER   Pec Minor Doorway stretch 30 sec: VC for UEs at lower position    Manual Therapy   STM to R Upper trap Trp, pec minor, and bicep muscle belly Lymphatic massage of upper arm fluid   R Glenohumeral mobilizations P-A 3 bouts x 30 sec    to reduce tissue tension, improve R shoulder range of motion, neuromodulation, in order to promote improved ability to complete functional activities                              PT Education - 01/03/21 1554     Education provided Yes    Education Details therex form/technique    Person(s) Educated Patient    Methods Explanation;Demonstration;Verbal cues    Comprehension Verbalized understanding;Returned demonstration;Verbal cues required              PT Short Term Goals - 11/29/20 1157       PT SHORT TERM GOAL #1   Title Pt will be independent with HEP in order to improve strength and decrease pain in order to improve pain-free function at home and work.    Baseline 11/09/20 HEP given    Time 4    Period Weeks    Status New               PT Long Term Goals - 11/29/20 1157       PT LONG TERM GOAL #1   Title Pt will decrease worst pain as reported on NPRS by at least 3 points in order to demonstrate clinically significant reduction in pain.    Baseline 11/09/20 7/10    Time 8    Period Weeks    Status New      PT LONG TERM GOAL #2   Title Pt will demonstrate full active R shoulder ROM in order to complete household ADLs    Baseline 11/29/20 flex: 100d abd: 84d ER R ear IR T12    Time 8    Period Weeks    Status New      PT LONG TERM GOAL #3   Title Patient will improve her hip FOTO by at least 10 points as a demonstration of improved function.    Baseline 11/29/20 59    Time 8    Period Weeks    Status New      PT LONG TERM GOAL #4   Title Pt will demonstrate R shoulder and periscapular strength of at least 4+/5 in order to complete heavy household chores    Baseline 11/29/20 gross shoulder strength 2-/5; Y lower trap: 3/5; T scap retraction 4/5    Time 8    Period  Weeks    Status New  Plan - 01/03/21 1558     Clinical Impression Statement PT continued therex progression for increased shoulder mobility, and motor control with success. Patient is able to comply with all cuing for proper technique of therex with good motivaiton throughout session. Paitent with no increase in pain throughout session. Pt still lacking final ~20d of overhead motion. Pt will continue to benefit from skilled PT to address remaining strength and motion limitations. PT will continue progression as able.    Personal Factors and Comorbidities Age;Comorbidity 3+;Fitness    Comorbidities HTN, arthritis, sciatica    Examination-Activity Limitations Bathing;Reach Overhead;Lift;Sleep;Carry;Hygiene/Grooming    Examination-Participation Restrictions Cleaning;Laundry;Community Activity;Driving    Stability/Clinical Decision Making Evolving/Moderate complexity    Clinical Decision Making Moderate    Rehab Potential Good    PT Frequency 2x / week    PT Duration 6 weeks    PT Treatment/Interventions Therapeutic activities;Therapeutic exercise;Balance training;Neuromuscular re-education;Patient/family education;Manual techniques;Dry needling;Aquatic Therapy;Electrical Stimulation;Iontophoresis 4mg /ml Dexamethasone;Gait training;Stair training;Passive range of motion;Joint Manipulations;Spinal Manipulations;Functional mobility training;ADLs/Self Care Home Management;Cryotherapy;Traction;Moist Heat;Ultrasound    PT Next Visit Plan cont POC    PT Home Exercise Plan pulleys, scapular retractions, UT stretch, ice    Consulted and Agree with Plan of Care Patient             Patient will benefit from skilled therapeutic intervention in order to improve the following deficits and impairments:  Improper body mechanics, Decreased strength, Difficulty walking, Decreased activity tolerance, Decreased coordination, Decreased safety awareness, Impaired flexibility, Impaired UE  functional use, Postural dysfunction, Pain, Decreased range of motion, Decreased endurance, Decreased mobility, Impaired sensation  Visit Diagnosis: Stiffness of right shoulder, not elsewhere classified     Problem List Patient Active Problem List   Diagnosis Date Noted   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   Unilateral primary osteoarthritis, left hip 04/25/2020   Status post total replacement of right hip 09/06/2019   Pelvic pain in female 07/20/2019   Hyperlipidemia 07/20/2019   B12 deficiency 07/20/2019   Neuropathic pain of left hand 06/23/2017   Spinal stenosis of lumbosacral region 11/11/2016   Primary osteoarthritis of right hip 11/11/2016   Obesity (BMI 30.0-34.9) 09/12/2016   H/O supraventricular tachycardia 01/14/2016   Hypertension 12/24/2015   Back pain with right-sided sciatica 12/24/2015   Gallstone 10/19/2015   Biliary colic 10/19/2015   Diverticulitis 06/28/2014   06/30/2014 DPT Hilda Lias 01/03/2021, 4:17 PM  [facility] [address], [address], [place] Phone: [phone]   Fax:  [phone]  Name: Hannah Yu MRN: Evalina Field Date of Birth: Sep 05, 1945

## 2021-01-08 ENCOUNTER — Ambulatory Visit: Payer: Medicare Other | Admitting: Physical Therapy

## 2021-01-08 ENCOUNTER — Encounter: Payer: Self-pay | Admitting: Physical Therapy

## 2021-01-08 DIAGNOSIS — M25611 Stiffness of right shoulder, not elsewhere classified: Secondary | ICD-10-CM | POA: Diagnosis not present

## 2021-01-08 DIAGNOSIS — M6281 Muscle weakness (generalized): Secondary | ICD-10-CM

## 2021-01-08 NOTE — Therapy (Signed)
[facility] [address] [place], [address] Phone: [phone]   Fax:  [phone]  Physical Therapy Treatment/Progress Note Reporting Period: 11/29/20 - 01/08/21  Patient Details  Name: Hannah Yu MRN: [id number] Date of Birth: July 18, 1945 No data recorded  Encounter Date: 01/08/2021   PT End of Session - 01/08/21 1455     Visit Number 10    Number of Visits 17    Date for PT Re-Evaluation 01/25/21    Authorization Type medicare    Authorization Time Period of 10 progress report    Authorization - Visit Number 10    Authorization - Number of Visits 10    PT Start Time 1430    PT Stop Time 1509    PT Time Calculation (min) 39 min    Activity Tolerance Patient tolerated treatment well    Behavior During Therapy [facility] for tasks assessed/performed             Past Medical History:  Diagnosis Date   Acid reflux    Anesthesia complication    Tachycardia previously, now on metoprolol   Arthritis    09/02/2019: per patient "have it real bad in both hands and back"   Hypertension    Sciatica    09/02/2019: has had it for about 3 years    Past Surgical History:  Procedure Laterality Date   ABDOMINAL HYSTERECTOMY     CATARACT EXTRACTION W/ INTRAOCULAR LENS IMPLANT Bilateral 2007   eyes done within 1 month of each other.   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   TOTAL HIP ARTHROPLASTY Right 09/06/2019   Procedure: RIGHT TOTAL HIP ARTHROPLASTY ANTERIOR APPROACH;  Surgeon: Mcarthur Rossetti, MD;  Location: [place];  Service: Orthopedics;  Laterality: Right;   TOTAL HIP ARTHROPLASTY Left 07/31/2020   Procedure: LEFT TOTAL HIP ARTHROPLASTY ANTERIOR APPROACH;  Surgeon: Mcarthur Rossetti, MD;  Location: [place];  Service: Orthopedics;  Laterality: Left;    There were no vitals filed for this visit.   Subjective Assessment -  01/08/21 1436     Subjective Pt reports her shoulder is feeling better overall. Still feels like the shoulder motion is improving, but mass in upper arm is not, still feels hard and painful. Completing HEP    Pertinent History Patient reports to PT with upper R arm pain since getting her 2nd COVID pzier shot. she has swelling in her upper arm that she reports has been present since having her shot. She has pain right at this area (ant/mid delt) that comes and goes. Reports pain is present with moving her arm behind her, any overhead motion, lifitng >5#, pulling. No pain at rest, but can wake her up if she rolls onto her arm when she is sleeping, and has trouble getting back to sleep. Has 0/10 pain at rest, at least 7/10 with aggravating motions. Has tried several topical ointments without avail, reports heat helps momentarily, and ibprofen and tramadol help short term with her pain. Limited in activies such as driving, unable to play her hobby of cornhole, difficulty with washing her hair. Pt denies N/V, B&B changes, unexplained weight fluctuation, saddle paresthesia, fever, night sweats, or unrelenting night pain at this time.    Limitations Lifting;House hold activities    How long can you sit comfortably? unlimited    How long can you stand comfortably? unlimited    How long can  you walk comfortably? unlimited    Diagnostic tests MRI R shoulder unremarkable, did demonstrate swelling, [place] as well    Patient Stated Goals Be able to move my arm    Pain Onset More than a month ago            Ther-Ex Pulleys abd and flex x12 each 2sec hold   Lat pull downs 20# 3x 10/9/8 with min cuing to prevent shoulder hiking with good carry over   ROM and MMT assessment education on remaining deficits  Y on wall x10; with 1# DB in each hand 2x 10 with min cuing to maintain elbow ext   Band pull aparts (horizontal abd) RTB 2x 8 with min cuing for scapular retraction initially with good carry over   Pec  Minor Doorway stretch 30 sec: VC for UEs at lower position   Manual Therapy   STM to R Upper trap Trp, pec minor, and bicep muscle belly Lymphatic massage of upper arm fluid   R Glenohumeral mobilizations P-A 3 bouts x 30 sec    to reduce tissue tension, improve R shoulder range of motion, neuromodulation, in order to promote improved ability to complete functional activities                             PT Education - 01/08/21 1455     Education provided Yes    Education Details therex form/technique    Person(s) Educated Patient    Methods Explanation;Demonstration;Verbal cues    Comprehension Verbalized understanding;Returned demonstration;Verbal cues required              PT Short Term Goals - 01/08/21 1456       PT SHORT TERM GOAL #1   Title Pt will be independent with HEP in order to improve strength and decrease pain in order to improve pain-free function at home and work.    Baseline 11/09/20 HEP given; 01/08/21 completing HEP without question or concern    Time 4    Period Weeks    Status Achieved               PT Long Term Goals - 01/08/21 1456       PT LONG TERM GOAL #1   Title Pt will decrease worst pain as reported on NPRS by at least 3 points in order to demonstrate clinically significant reduction in pain.    Baseline 11/09/20 7/10; 01/08/21 6/10 at mass at upper arm only    Time 8    Period Weeks    Status On-going      PT LONG TERM GOAL #2   Title Pt will demonstrate full active R shoulder ROM in order to complete household ADLs    Baseline 11/29/20 flex: 100d abd: 84d ER R ear IR T12; 01/08/21 flex: 138d abd: 132d ER C7 IR T10    Time 8    Period Weeks    Status Partially Met      PT LONG TERM GOAL #3   Title Patient will improve her hip FOTO by to least 62 points as a demonstration of improved function.    Baseline 11/29/20 46; 01/08/21 57    Time 8    Period Weeks      PT LONG TERM GOAL #4   Title Pt will demonstrate R  shoulder and periscapular strength of at least 4+/5 in order to complete heavy household chores    Baseline 11/29/20 gross  shoulder strength 2-/5; Y lower trap: 3/5; T scap retraction 4/5; 01/08/21/22 gross shoulder strength 4+/5 in avalible range; Y lower trap: 4/5; T scap retraction 4+/5    Time 8    Period Weeks    Status Partially Met                   Plan - 01/08/21 1516     Clinical Impression Statement PT reassessed goals this session where patient has made excellent progress toward goals. Pt with remaining deficits in final degrees of overhead mobility, and lower trap strength. Patient is able to comply with cuing for proper technique of therex to work toward these goals. Mass at upper arm is not appearing to get better in size or pain. Pt will continue to benefit from therapy to address remaining impairments, and would benefit from continued MD consult of upper arm mass, d/t it not appearing to have MSK pattern. PT iwll continue progression as able.    Personal Factors and Comorbidities Age;Comorbidity 3+;Fitness    Comorbidities HTN, arthritis, sciatica    Examination-Activity Limitations Bathing;Reach Overhead;Lift;Sleep;Carry;Hygiene/Grooming    Examination-Participation Restrictions Cleaning;Laundry;Community Activity;Driving    Stability/Clinical Decision Making Evolving/Moderate complexity    Clinical Decision Making Moderate    Rehab Potential Good    PT Frequency 2x / week    PT Duration 6 weeks    PT Treatment/Interventions Therapeutic activities;Therapeutic exercise;Balance training;Neuromuscular re-education;Patient/family education;Manual techniques;Dry needling;Aquatic Therapy;Electrical Stimulation;Iontophoresis 25m/ml Dexamethasone;Gait training;Stair training;Passive range of motion;Joint Manipulations;Spinal Manipulations;Functional mobility training;ADLs/Self Care Home Management;Cryotherapy;Traction;Moist Heat;Ultrasound    PT Next Visit Plan cont POC    PT  Home Exercise Plan pulleys, scapular retractions, UT stretch, ice    Consulted and Agree with Plan of Care Patient             Patient will benefit from skilled therapeutic intervention in order to improve the following deficits and impairments:  Improper body mechanics, Decreased strength, Difficulty walking, Decreased activity tolerance, Decreased coordination, Decreased safety awareness, Impaired flexibility, Impaired UE functional use, Postural dysfunction, Pain, Decreased range of motion, Decreased endurance, Decreased mobility, Impaired sensation  Visit Diagnosis: Stiffness of right shoulder, not elsewhere classified  Muscle weakness (generalized)     Problem List Patient Active Problem List   Diagnosis Date Noted   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   Unilateral primary osteoarthritis, left hip 04/25/2020   Status post total replacement of right hip 09/06/2019   Pelvic pain in female 07/20/2019   Hyperlipidemia 07/20/2019   B12 deficiency 07/20/2019   Neuropathic pain of left hand 06/23/2017   Spinal stenosis of lumbosacral region 11/11/2016   Primary osteoarthritis of right hip 11/11/2016   Obesity (BMI 30.0-34.9) 09/12/2016   H/O supraventricular tachycardia 01/14/2016   Hypertension 12/24/2015   Back pain with right-sided sciatica 12/24/2015   Gallstone 010/25/8527  Biliary colic 078/24/2353  Diverticulitis 06/28/2014   CDurwin RegesDPT CDurwin Reges9/10/2020, 3:19 PM  [place] [facility] [address] [place] [address]Phone: [phone]  Fax:  [phone] Name: Hannah BEHRENDTMRN: [id number] of Birth: 203-08-1945

## 2021-01-10 ENCOUNTER — Encounter: Payer: Self-pay | Admitting: Physical Therapy

## 2021-01-10 ENCOUNTER — Ambulatory Visit: Payer: Medicare Other | Admitting: Physical Therapy

## 2021-01-10 DIAGNOSIS — M25611 Stiffness of right shoulder, not elsewhere classified: Secondary | ICD-10-CM

## 2021-01-10 NOTE — Therapy (Signed)
[facility] [address] [place], [address] Phone: [phone]   Fax:  [phone]  Physical Therapy Treatment  Patient Details  Name: Hannah Yu MRN: [id number] Date of Birth: 1946/02/18 No data recorded  Encounter Date: 01/10/2021   PT End of Session - 01/10/21 1109     Visit Number 11    Number of Visits 17    Date for PT Re-Evaluation 01/25/21    Authorization Type medicare    Authorization - Visit Number 11    PT Start Time 2355    PT Stop Time 1106    PT Time Calculation (min) 38 min    Activity Tolerance Patient tolerated treatment well    Behavior During Therapy [facility] for tasks assessed/performed             Past Medical History:  Diagnosis Date   Acid reflux    Anesthesia complication    Tachycardia previously, now on metoprolol   Arthritis    09/02/2019: per patient "have it real bad in both hands and back"   Hypertension    Sciatica    09/02/2019: has had it for about 3 years    Past Surgical History:  Procedure Laterality Date   ABDOMINAL HYSTERECTOMY     CATARACT EXTRACTION W/ INTRAOCULAR LENS IMPLANT Bilateral 2007   eyes done within 1 month of each other.   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   TOTAL HIP ARTHROPLASTY Right 09/06/2019   Procedure: RIGHT TOTAL HIP ARTHROPLASTY ANTERIOR APPROACH;  Surgeon: Mcarthur Rossetti, MD;  Location: [place];  Service: Orthopedics;  Laterality: Right;   TOTAL HIP ARTHROPLASTY Left 07/31/2020   Procedure: LEFT TOTAL HIP ARTHROPLASTY ANTERIOR APPROACH;  Surgeon: Mcarthur Rossetti, MD;  Location: [place];  Service: Orthopedics;  Laterality: Left;    There were no vitals filed for this visit.   Subjective Assessment - 01/10/21 1030     Subjective Pt reports R shoulder pain 4/10 today only in the mass. She continues to report her R shoulder mobility is improving.     Pertinent History Patient reports to PT with upper R arm pain since getting her 2nd COVID pzier shot. she has swelling in her upper arm that she reports has been present since having her shot. She has pain right at this area (ant/mid delt) that comes and goes. Reports pain is present with moving her arm behind her, any overhead motion, lifitng >5#, pulling. No pain at rest, but can wake her up if she rolls onto her arm when she is sleeping, and has trouble getting back to sleep. Has 0/10 pain at rest, at least 7/10 with aggravating motions. Has tried several topical ointments without avail, reports heat helps momentarily, and ibprofen and tramadol help short term with her pain. Limited in activies such as driving, unable to play her hobby of cornhole, difficulty with washing her hair. Pt denies N/V, B&B changes, unexplained weight fluctuation, saddle paresthesia, fever, night sweats, or unrelenting night pain at this time.    Limitations Lifting;House hold activities    How long can you sit comfortably? unlimited    How long can you stand comfortably? unlimited            Ther-Ex Pulleys abd and flex x12 each 2sec hold   Lat pull downs 20# 3x 10 reps    Overhead wall slides with foam roller and yellow  tband ABD 1 x 10 reps; without tband 2 x 10 reps    Shoulder Extension 2 x 10 reps with cues for scapular retraction  Standing Y raises RTB 2 x 8 Reps VC for eccentric control on lowering   Omega Chest Press 2 x10 reps #10  Pec Minor Doorway stretch 2 x 30 sec: VC for UEs at lower position  Manual Therapy   Supine STM to R Upper trap Trp bicep muscle belly  Seated pec minor stretch                            PT Education - 01/10/21 1534     Education provided Yes    Education Details therex form/technique    Person(s) Educated Patient    Methods Explanation;Demonstration;Verbal cues    Comprehension Verbalized understanding;Returned demonstration;Verbal  cues required              PT Short Term Goals - 01/08/21 1456       PT SHORT TERM GOAL #1   Title Pt will be independent with HEP in order to improve strength and decrease pain in order to improve pain-free function at home and work.    Baseline 11/09/20 HEP given; 01/08/21 completing HEP without question or concern    Time 4    Period Weeks    Status Achieved               PT Long Term Goals - 01/08/21 1456       PT LONG TERM GOAL #1   Title Pt will decrease worst pain as reported on NPRS by at least 3 points in order to demonstrate clinically significant reduction in pain.    Baseline 11/09/20 7/10; 01/08/21 6/10 at mass at upper arm only    Time 8    Period Weeks    Status On-going      PT LONG TERM GOAL #2   Title Pt will demonstrate full active R shoulder ROM in order to complete household ADLs    Baseline 11/29/20 flex: 100d abd: 84d ER R ear IR T12; 01/08/21 flex: 138d abd: 132d ER C7 IR T10    Time 8    Period Weeks    Status Partially Met      PT LONG TERM GOAL #3   Title Patient will improve her hip FOTO by to least 62 points as a demonstration of improved function.    Baseline 11/29/20 46; 01/08/21 57    Time 8    Period Weeks      PT LONG TERM GOAL #4   Title Pt will demonstrate R shoulder and periscapular strength of at least 4+/5 in order to complete heavy household chores    Baseline 11/29/20 gross shoulder strength 2-/5; Y lower trap: 3/5; T scap retraction 4/5; 01/08/21/22 gross shoulder strength 4+/5 in avalible range; Y lower trap: 4/5; T scap retraction 4+/5    Time 8    Period Weeks    Status Partially Met                   Plan - 01/10/21 1244     Clinical Impression Statement Pt tolerated session well with no reports of pain following manual therapy and exercise. PT continued focus on progressive R shoulder strenghtening and mobility. Pt remains limited in AROM flexion and needed multimodal cuing throughout session for correct form/technique  to targeted musculature. Pt remains motivated to perform overhead  therex. PT will continue to be beneficial to optimize R shoulder overhead mobility and strength.    Personal Factors and Comorbidities Age;Comorbidity 3+;Fitness    Comorbidities HTN, arthritis, sciatica    Examination-Activity Limitations Bathing;Reach Overhead;Lift;Sleep;Carry;Hygiene/Grooming    Examination-Participation Restrictions Cleaning;Laundry;Community Activity;Driving    Stability/Clinical Decision Making Evolving/Moderate complexity    Clinical Decision Making Moderate    Rehab Potential Good    PT Frequency 2x / week    PT Duration 6 weeks    PT Treatment/Interventions Therapeutic activities;Therapeutic exercise;Balance training;Neuromuscular re-education;Patient/family education;Manual techniques;Dry needling;Aquatic Therapy;Electrical Stimulation;Iontophoresis 60m/ml Dexamethasone;Gait training;Stair training;Passive range of motion;Joint Manipulations;Spinal Manipulations;Functional mobility training;ADLs/Self Care Home Management;Cryotherapy;Traction;Moist Heat;Ultrasound    PT Next Visit Plan cont POC    PT Home Exercise Plan pulleys, scapular retractions, UT stretch, ice    Consulted and Agree with Plan of Care Patient             Patient will benefit from skilled therapeutic intervention in order to improve the following deficits and impairments:  Improper body mechanics, Decreased strength, Difficulty walking, Decreased activity tolerance, Decreased coordination, Decreased safety awareness, Impaired flexibility, Impaired UE functional use, Postural dysfunction, Pain, Decreased range of motion, Decreased endurance, Decreased mobility, Impaired sensation  Visit Diagnosis: Stiffness of right shoulder, not elsewhere classified     Problem List Patient Active Problem List   Diagnosis Date Noted   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   Unilateral primary osteoarthritis, left hip  04/25/2020   Status post total replacement of right hip 09/06/2019   Pelvic pain in female 07/20/2019   Hyperlipidemia 07/20/2019   B12 deficiency 07/20/2019   Neuropathic pain of left hand 06/23/2017   Spinal stenosis of lumbosacral region 11/11/2016   Primary osteoarthritis of right hip 11/11/2016   Obesity (BMI 30.0-34.9) 09/12/2016   H/O supraventricular tachycardia 01/14/2016   Hypertension 12/24/2015   Back pain with right-sided sciatica 12/24/2015   Gallstone 097/98/9211  Biliary colic 094/17/4081  Diverticulitis 06/28/2014     CDurwin RegesDPT ASharion Settler SPT  CDurwin Reges PT 01/10/2021, 3:36 PM  [facility] [address] [place] [address]Phone: [phone]  Fax:  [phone] Name: MKIMBERLEIGH MEHANMRN: [id number] of Birth: 2Jan 28, 1947

## 2021-01-14 ENCOUNTER — Encounter: Payer: Self-pay | Admitting: Physical Therapy

## 2021-01-14 ENCOUNTER — Ambulatory Visit: Payer: Medicare Other | Admitting: Physical Therapy

## 2021-01-14 DIAGNOSIS — M25611 Stiffness of right shoulder, not elsewhere classified: Secondary | ICD-10-CM | POA: Diagnosis not present

## 2021-01-14 NOTE — Therapy (Signed)
[facility] [address] [place], [address] Phone: [phone]   Fax:  [phone]  Physical Therapy Treatment  Patient Details  Name: Hannah Yu MRN: [id number] Date of Birth: 1946-04-25 No data recorded  Encounter Date: 01/14/2021   PT End of Session - 01/14/21 1305     Visit Number 12    Number of Visits 17    Date for PT Re-Evaluation 01/25/21    Authorization Type medicare    Authorization - Visit Number 12    Authorization - Number of Visits 10    PT Start Time 1300    PT Stop Time 1340    PT Time Calculation (min) 40 min    Activity Tolerance Patient tolerated treatment well    Behavior During Therapy [facility] for tasks assessed/performed             Past Medical History:  Diagnosis Date   Acid reflux    Anesthesia complication    Tachycardia previously, now on metoprolol   Arthritis    09/02/2019: per patient "have it real bad in both hands and back"   Hypertension    Sciatica    09/02/2019: has had it for about 3 years    Past Surgical History:  Procedure Laterality Date   ABDOMINAL HYSTERECTOMY     CATARACT EXTRACTION W/ INTRAOCULAR LENS IMPLANT Bilateral 2007   eyes done within 1 month of each other.   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   TOTAL HIP ARTHROPLASTY Right 09/06/2019   Procedure: RIGHT TOTAL HIP ARTHROPLASTY ANTERIOR APPROACH;  Surgeon: Mcarthur Rossetti, MD;  Location: [place];  Service: Orthopedics;  Laterality: Right;   TOTAL HIP ARTHROPLASTY Left 07/31/2020   Procedure: LEFT TOTAL HIP ARTHROPLASTY ANTERIOR APPROACH;  Surgeon: Mcarthur Rossetti, MD;  Location: [place];  Service: Orthopedics;  Laterality: Left;    There were no vitals filed for this visit.   Subjective Assessment - 01/14/21 1305     Subjective Pt denies any pain until AAROM of r shoulder. She reports 4/10 pain in  R  shoulder with AAROM stretching.    Pertinent History Patient reports to PT with upper R arm pain since getting her 2nd COVID pzier shot. she has swelling in her upper arm that she reports has been present since having her shot. She has pain right at this area (ant/mid delt) that comes and goes. Reports pain is present with moving her arm behind her, any overhead motion, lifitng >5#, pulling. No pain at rest, but can wake her up if she rolls onto her arm when she is sleeping, and has trouble getting back to sleep. Has 0/10 pain at rest, at least 7/10 with aggravating motions. Has tried several topical ointments without avail, reports heat helps momentarily, and ibprofen and tramadol help short term with her pain. Limited in activies such as driving, unable to play her hobby of cornhole, difficulty with washing her hair. Pt denies N/V, B&B changes, unexplained weight fluctuation, saddle paresthesia, fever, night sweats, or unrelenting night pain at this time.    Limitations Lifting;House hold activities    How long can you sit comfortably? unlimited    How long can you stand comfortably? unlimited    How long can you walk comfortably? unlimited    Diagnostic tests MRI R shoulder unremarkable, did demonstrate swelling, [place] as well    Patient Stated Goals Be  able to move my arm              Therex  TRX AAROM flexion & ABD 1 x 15 reps  Supine Lat Pull over PVV + Chest press #5 3 x 10 reps  Skull Crushers 2 x 10 reps PVC 5#  Bus Driver 5# DB 2 x 32RJJ   Shoulder extension 3 x 10 reps   Standing shoulder ER 2 x 8 reps RTB with cues to tuck elbow to side   Manual Therapy Pec minor pin and stretch Upper trap Stretch STM to R Upper trap                       PT Education - 01/14/21 1259     Education provided Yes    Education Details therex form/technique    Person(s) Educated Patient    Methods Explanation;Demonstration    Comprehension Verbalized  understanding;Returned demonstration              PT Short Term Goals - 01/08/21 1456       PT SHORT TERM GOAL #1   Title Pt will be independent with HEP in order to improve strength and decrease pain in order to improve pain-free function at home and work.    Baseline 11/09/20 HEP given; 01/08/21 completing HEP without question or concern    Time 4    Period Weeks    Status Achieved               PT Long Term Goals - 01/08/21 1456       PT LONG TERM GOAL #1   Title Pt will decrease worst pain as reported on NPRS by at least 3 points in order to demonstrate clinically significant reduction in pain.    Baseline 11/09/20 7/10; 01/08/21 6/10 at mass at upper arm only    Time 8    Period Weeks    Status On-going      PT LONG TERM GOAL #2   Title Pt will demonstrate full active R shoulder ROM in order to complete household ADLs    Baseline 11/29/20 flex: 100d abd: 84d ER R ear IR T12; 01/08/21 flex: 138d abd: 132d ER C7 IR T10    Time 8    Period Weeks    Status Partially Met      PT LONG TERM GOAL #3   Title Patient will improve her hip FOTO by to least 62 points as a demonstration of improved function.    Baseline 11/29/20 46; 01/08/21 57    Time 8    Period Weeks      PT LONG TERM GOAL #4   Title Pt will demonstrate R shoulder and periscapular strength of at least 4+/5 in order to complete heavy household chores    Baseline 11/29/20 gross shoulder strength 2-/5; Y lower trap: 3/5; T scap retraction 4/5; 01/08/21/22 gross shoulder strength 4+/5 in avalible range; Y lower trap: 4/5; T scap retraction 4+/5    Time 8    Period Weeks    Status Partially Met                   Plan - 01/14/21 1342     Clinical Impression Statement Pt tolerated session well with no reports of pain following manual therapy and exercise. PT continued focus on progressive R shoulder strenghtening and mobility. Pt remains limited in AROM r shoulder flexion. Pt remains motivated to perform  overhead therex.  PT will continue to be beneficial to optimize R shoulder overhead mobility and strength.    Personal Factors and Comorbidities Age;Comorbidity 3+;Fitness    Comorbidities HTN, arthritis, sciatica    Examination-Activity Limitations Bathing;Reach Overhead;Lift;Sleep;Carry;Hygiene/Grooming    Examination-Participation Restrictions Cleaning;Laundry;Community Activity;Driving    Stability/Clinical Decision Making Evolving/Moderate complexity    Rehab Potential Good    PT Frequency 2x / week    PT Duration 6 weeks    PT Treatment/Interventions Therapeutic activities;Therapeutic exercise;Balance training;Neuromuscular re-education;Patient/family education;Manual techniques;Dry needling;Aquatic Therapy;Electrical Stimulation;Iontophoresis 71m/ml Dexamethasone;Gait training;Stair training;Passive range of motion;Joint Manipulations;Spinal Manipulations;Functional mobility training;ADLs/Self Care Home Management;Cryotherapy;Traction;Moist Heat;Ultrasound    PT Next Visit Plan cont POC    PT Home Exercise Plan pulleys, scapular retractions, UT stretch, ice    Consulted and Agree with Plan of Care Patient             Patient will benefit from skilled therapeutic intervention in order to improve the following deficits and impairments:  Improper body mechanics, Decreased strength, Difficulty walking, Decreased activity tolerance, Decreased coordination, Decreased safety awareness, Impaired flexibility, Impaired UE functional use, Postural dysfunction, Pain, Decreased range of motion, Decreased endurance, Decreased mobility, Impaired sensation  Visit Diagnosis: Stiffness of right shoulder, not elsewhere classified     Problem List Patient Active Problem List   Diagnosis Date Noted   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   Unilateral primary osteoarthritis, left hip 04/25/2020   Status post total replacement of right hip 09/06/2019   Pelvic pain in female 07/20/2019    Hyperlipidemia 07/20/2019   B12 deficiency 07/20/2019   Neuropathic pain of left hand 06/23/2017   Spinal stenosis of lumbosacral region 11/11/2016   Primary osteoarthritis of right hip 11/11/2016   Obesity (BMI 30.0-34.9) 09/12/2016   H/O supraventricular tachycardia 01/14/2016   Hypertension 12/24/2015   Back pain with right-sided sciatica 12/24/2015   Gallstone 034/19/3790  Biliary colic 024/01/7352  Diverticulitis 06/28/2014   CDurwin RegesDPT CDurwin RegesDPT ASharion Settler SPT  CDurwin Reges PT 01/15/2021, 12:00 PM  [facility] [address] [place] [address]Phone: [phone]  Fax:  [phone] Name: MINOLA LISLEMRN: [id number] of Birth: 205-27-1947

## 2021-01-17 ENCOUNTER — Ambulatory Visit: Payer: Medicare Other | Admitting: Physical Therapy

## 2021-01-17 ENCOUNTER — Encounter: Payer: Self-pay | Admitting: Physical Therapy

## 2021-01-17 DIAGNOSIS — M25611 Stiffness of right shoulder, not elsewhere classified: Secondary | ICD-10-CM | POA: Diagnosis not present

## 2021-01-17 NOTE — Therapy (Signed)
[facility] [address], [place], [address] Phone: [phone]   Fax:  [phone]  Physical Therapy Treatment  Patient Details  Name: Hannah Yu MRN: [id number] Date of Birth: 1946-02-25 No data recorded  Encounter Date: 01/17/2021   PT End of Session - 01/17/21 1513     Visit Number 13    Number of Visits 17    Date for PT Re-Evaluation 01/25/21    Authorization Type medicare    Authorization - Visit Number 13    Authorization - Number of Visits 10    PT Start Time 6811    PT Stop Time 1508    PT Time Calculation (min) 40 min    Activity Tolerance Patient tolerated treatment well    Behavior During Therapy [facility] for tasks assessed/performed             Past Medical History:  Diagnosis Date   Acid reflux    Anesthesia complication    Tachycardia previously, now on metoprolol   Arthritis    09/02/2019: per patient "have it real bad in both hands and back"   Hypertension    Sciatica    09/02/2019: has had it for about 3 years    Past Surgical History:  Procedure Laterality Date   ABDOMINAL HYSTERECTOMY     CATARACT EXTRACTION W/ INTRAOCULAR LENS IMPLANT Bilateral 2007   eyes done within 1 month of each other.   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   TOTAL HIP ARTHROPLASTY Right 09/06/2019   Procedure: RIGHT TOTAL HIP ARTHROPLASTY ANTERIOR APPROACH;  Surgeon: Mcarthur Rossetti, MD;  Location: [facility];  Service: Orthopedics;  Laterality: Right;   TOTAL HIP ARTHROPLASTY Left 07/31/2020   Procedure: LEFT TOTAL HIP ARTHROPLASTY ANTERIOR APPROACH;  Surgeon: Mcarthur Rossetti, MD;  Location: [place];  Service: Orthopedics;  Laterality: Left;    There were no vitals filed for this visit.   Subjective Assessment - 01/17/21 1433     Subjective Pt reports having 4/10 R shoulder pain. She also reports having pain in her  back and knees today.    Pertinent History Patient reports to PT with upper R arm pain since getting her 2nd COVID pzier shot. she has swelling in her upper arm that she reports has been present since having her shot. She has pain right at this area (ant/mid delt) that comes and goes. Reports pain is present with moving her arm behind her, any overhead motion, lifitng >5#, pulling. No pain at rest, but can wake her up if she rolls onto her arm when she is sleeping, and has trouble getting back to sleep. Has 0/10 pain at rest, at least 7/10 with aggravating motions. Has tried several topical ointments without avail, reports heat helps momentarily, and ibprofen and tramadol help short term with her pain. Limited in activies such as driving, unable to play her hobby of cornhole, difficulty with washing her hair. Pt denies N/V, B&B changes, unexplained weight fluctuation, saddle paresthesia, fever, night sweats, or unrelenting night pain at this time.    How long can you sit comfortably? unlimited    How long can you stand comfortably? unlimited    How long can you walk comfortably? unlimited    Diagnostic tests MRI R shoulder unremarkable, did demonstrate swelling, [place] as well    Patient Stated Goals Be able to move my arm  Ther-ex  AAROM TRX FLEX ABD x 15   OMEGA Eccentric Bicep curl 5# 2 x 10   OMEGA Lat pull down # 15 2 x 10 emphasis on scapular retraction  OMEGA Serratus punches cables 2  x 10 #5 with emphasis on scapular protraction   TRX ROWS 1 x 10 reps  Physioball rolls on wall 2 x 10 reps cues to lean FWD into shoulder flex  Shoulder Flex/ABD combo 2 x8 reps   IR towel stretch 3 x 30 sec  Doorway pec stretch 3 x30 sec                    PT Education - 01/18/21 0859     Education provided Yes    Education Details therex form/technique and PT POC to discharge next session.    Person(s) Educated Patient    Methods Explanation;Demonstration     Comprehension Verbalized understanding;Returned demonstration              PT Short Term Goals - 01/08/21 1456       PT SHORT TERM GOAL #1   Title Pt will be independent with HEP in order to improve strength and decrease pain in order to improve pain-free function at home and work.    Baseline 11/09/20 HEP given; 01/08/21 completing HEP without question or concern    Time 4    Period Weeks    Status Achieved               PT Long Term Goals - 01/08/21 1456       PT LONG TERM GOAL #1   Title Pt will decrease worst pain as reported on NPRS by at least 3 points in order to demonstrate clinically significant reduction in pain.    Baseline 11/09/20 7/10; 01/08/21 6/10 at mass at upper arm only    Time 8    Period Weeks    Status On-going      PT LONG TERM GOAL #2   Title Pt will demonstrate full active R shoulder ROM in order to complete household ADLs    Baseline 11/29/20 flex: 100d abd: 84d ER R ear IR T12; 01/08/21 flex: 138d abd: 132d ER C7 IR T10    Time 8    Period Weeks    Status Partially Met      PT LONG TERM GOAL #3   Title Patient will improve her hip FOTO by to least 62 points as a demonstration of improved function.    Baseline 11/29/20 46; 01/08/21 57    Time 8    Period Weeks      PT LONG TERM GOAL #4   Title Pt will demonstrate R shoulder and periscapular strength of at least 4+/5 in order to complete heavy household chores    Baseline 11/29/20 gross shoulder strength 2-/5; Y lower trap: 3/5; T scap retraction 4/5; 01/08/21/22 gross shoulder strength 4+/5 in avalible range; Y lower trap: 4/5; T scap retraction 4+/5    Time 8    Period Weeks    Status Partially Met                   Plan - 01/18/21 0900     Clinical Impression Statement Pt tolerated session well with reports of pain in R shoulder mass with resisted elbow flexion. PT continued focus on progression R UE strengthening and mobility. Pt remains motivated with therapy and has expressed  satisfaction with R shoulder improvement. PT plan to  discharge next session with updated HEP for continuance of care.    Personal Factors and Comorbidities Age;Comorbidity 3+;Fitness    Comorbidities HTN, arthritis, sciatica    Examination-Activity Limitations Bathing;Reach Overhead;Lift;Sleep;Carry;Hygiene/Grooming    Examination-Participation Restrictions Cleaning;Laundry;Community Activity;Driving    Stability/Clinical Decision Making Evolving/Moderate complexity    Clinical Decision Making Moderate    Rehab Potential Good    PT Frequency 2x / week    PT Duration 6 weeks    PT Treatment/Interventions Therapeutic activities;Therapeutic exercise;Balance training;Neuromuscular re-education;Patient/family education;Manual techniques;Dry needling;Aquatic Therapy;Electrical Stimulation;Iontophoresis 37m/ml Dexamethasone;Gait training;Stair training;Passive range of motion;Joint Manipulations;Spinal Manipulations;Functional mobility training;ADLs/Self Care Home Management;Cryotherapy;Traction;Moist Heat;Ultrasound    PT Next Visit Plan cont POC    PT Home Exercise Plan pulleys, scapular retractions, UT stretch, ice    Consulted and Agree with Plan of Care Patient             Patient will benefit from skilled therapeutic intervention in order to improve the following deficits and impairments:  Improper body mechanics, Decreased strength, Difficulty walking, Decreased activity tolerance, Decreased coordination, Decreased safety awareness, Impaired flexibility, Impaired UE functional use, Postural dysfunction, Pain, Decreased range of motion, Decreased endurance, Decreased mobility, Impaired sensation  Visit Diagnosis: Stiffness of right shoulder, not elsewhere classified     Problem List Patient Active Problem List   Diagnosis Date Noted   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   Unilateral primary osteoarthritis, left hip 04/25/2020   Status post total replacement of right  hip 09/06/2019   Pelvic pain in female 07/20/2019   Hyperlipidemia 07/20/2019   B12 deficiency 07/20/2019   Neuropathic pain of left hand 06/23/2017   Spinal stenosis of lumbosacral region 11/11/2016   Primary osteoarthritis of right hip 11/11/2016   Obesity (BMI 30.0-34.9) 09/12/2016   H/O supraventricular tachycardia 01/14/2016   Hypertension 12/24/2015   Back pain with right-sided sciatica 12/24/2015   Gallstone 095/62/1308  Biliary colic 065/78/4696  Diverticulitis 06/28/2014     CDurwin RegesDPT ASharion Settler SPT  CDurwin Reges PT 01/18/2021, 2:52 PM  [facility] [address] [place] [address]Phone: [phone]  Fax:  [phone] Name: Hannah HARPENAUMRN: [id number] of Birth: [birth year]

## 2021-01-22 ENCOUNTER — Ambulatory Visit: Payer: Medicare Other

## 2021-01-22 DIAGNOSIS — M25611 Stiffness of right shoulder, not elsewhere classified: Secondary | ICD-10-CM

## 2021-01-22 NOTE — Therapy (Signed)
[facility] [address] [place], [address] Phone: [phone]   Fax:  [phone]  Physical Therapy Treatment Discharge Summary Reporting period 01/08/21-01/22/21  Patient Details  Name: Hannah Yu MRN: [id number] Date of Birth: May 28, 1945 No data recorded  Encounter Date: 01/22/2021    Past Medical History:  Diagnosis Date   Acid reflux    Anesthesia complication    Tachycardia previously, now on metoprolol   Arthritis    09/02/2019: per patient "have it real bad in both hands and back"   Hypertension    Sciatica    09/02/2019: has had it for about 3 years    Past Surgical History:  Procedure Laterality Date   ABDOMINAL HYSTERECTOMY     CATARACT EXTRACTION W/ INTRAOCULAR LENS IMPLANT Bilateral 2007   eyes done within 1 month of each other.   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   TOTAL HIP ARTHROPLASTY Right 09/06/2019   Procedure: RIGHT TOTAL HIP ARTHROPLASTY ANTERIOR APPROACH;  Surgeon: Mcarthur Rossetti, MD;  Location: [facility];  Service: Orthopedics;  Laterality: Right;   TOTAL HIP ARTHROPLASTY Left 07/31/2020   Procedure: LEFT TOTAL HIP ARTHROPLASTY ANTERIOR APPROACH;  Surgeon: Mcarthur Rossetti, MD;  Location: [place] [facility];  Service: Orthopedics;  Laterality: Left;    There were no vitals filed for this visit.   Subjective Assessment - 01/22/21 1306     Subjective Pt reports having 4/10 R shoulder pain. She also reports having pain in her back and knees today. She is ready move onto eliminating pain in her L hip. She reports being independent with current HEP.    Pertinent History Patient reports to PT with upper R arm pain since getting her 2nd COVID pzier shot. she has swelling in her upper arm that she reports has been present since having her shot. She has pain right at this area (ant/mid delt) that comes and goes.  Reports pain is present with moving her arm behind her, any overhead motion, lifitng >5#, pulling. No pain at rest, but can wake her up if she rolls onto her arm when she is sleeping, and has trouble getting back to sleep. Has 0/10 pain at rest, at least 7/10 with aggravating motions. Has tried several topical ointments without avail, reports heat helps momentarily, and ibprofen and tramadol help short term with her pain. Limited in activies such as driving, unable to play her hobby of cornhole, difficulty with washing her hair. Pt denies N/V, B&B changes, unexplained weight fluctuation, saddle paresthesia, fever, night sweats, or unrelenting night pain at this time.    Limitations Lifting;House hold activities    How long can you sit comfortably? unlimited    How long can you stand comfortably? unlimited    How long can you walk comfortably? unlimited    Diagnostic tests MRI R shoulder unremarkable, did demonstrate swelling, [place] as well    Patient Stated Goals Be able to move my arm             Warm up   AAROM Pulleys Flex ABD/ADD x 20 reps each (not billed)  Physical Performance  R Shoulder MMT Y,T, all 4+/5 grossly  R Shoulder AROM Seated: flex: 135 deg, ABD 130 deg ER C7 IR T10  HEP review and education of continued consistency of participation despite end of PT POC for R shoulder ROM and strength. Pt verbally expressed understand and has  demonstrated independence.     PT Short Term Goals - 01/08/21 1456       PT SHORT TERM GOAL #1   Title Pt will be independent with HEP in order to improve strength and decrease pain in order to improve pain-free function at home and work.    Baseline 11/09/20 HEP given; 01/08/21 completing HEP without question or concern    Time 4    Period Weeks    Status Achieved               PT Long Term Goals - 01/22/21 1308       PT LONG TERM GOAL #1   Title Pt will decrease worst pain as reported on NPRS by at least 3 points in order to  demonstrate clinically significant reduction in pain.    Baseline 11/09/20 7/10; 01/08/21 6/10 at mass at upper arm only; 01/22/21 5/10 on L UE mass    Period Weeks    Status Not Met      PT LONG TERM GOAL #2   Title Pt will demonstrate full active R shoulder ROM in order to complete household ADLs    Baseline 11/29/20 flex: 100d abd: 84d ER R ear IR T12; 01/08/21 flex: 138d abd: 132d ER C7 IR T10 01/22/21: flex: 135d abd: 130d ER C7 IR T10    Time 8    Period Weeks    Status Partially Met      PT LONG TERM GOAL #3   Title Patient will improve her hip FOTO by to least 62 points as a demonstration of improved function.    Baseline 11/29/20 46; 01/08/21 57; 01/23/19    Time 8    Period Weeks      PT LONG TERM GOAL #4   Title Pt will demonstrate R shoulder and periscapular strength of at least 4+/5 in order to complete heavy household chores    Baseline 11/29/20 gross shoulder strength 2-/5; Y lower trap: 3/5; T scap retraction 4/5; 01/08/21/22 gross shoulder strength 4+/5 in avalible range; Y lower trap: 4/5; T scap retraction 4+/5 ; 01/22/21 gross shoulder strength 4+/5 in avalible range; Y lower trap: 4+/5; T scap retraction 4+/5    Time 8    Period Weeks    Status Achieved                    Patient will benefit from skilled therapeutic intervention in order to improve the following deficits and impairments:     Visit Diagnosis: No diagnosis found.     Problem List Patient Active Problem List   Diagnosis Date Noted   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   Unilateral primary osteoarthritis, left hip 04/25/2020   Status post total replacement of right hip 09/06/2019   Pelvic pain in female 07/20/2019   Hyperlipidemia 07/20/2019   B12 deficiency 07/20/2019   Neuropathic pain of left hand 06/23/2017   Spinal stenosis of lumbosacral region 11/11/2016   Primary osteoarthritis of right hip 11/11/2016   Obesity (BMI 30.0-34.9) 09/12/2016   H/O supraventricular  tachycardia 01/14/2016   Hypertension 12/24/2015   Back pain with right-sided sciatica 12/24/2015   Gallstone 09/32/3557   Biliary colic 32/20/2542   Diverticulitis 06/28/2014   Sharion Settler, SPT   Salem Caster. Fairly IV, PT, DPT Physical Therapist- [facility]  01/22/2021, 1:28 PM  [facility] [address], [place], [address]  Phone: [phone]   Fax:  [phone]  Name: Hannah Yu MRN: [id number] Date of Birth: Jul 09, 1945

## 2021-01-24 ENCOUNTER — Encounter: Payer: Self-pay | Admitting: Physical Therapy

## 2021-01-24 ENCOUNTER — Ambulatory Visit: Payer: Medicare Other

## 2021-01-24 DIAGNOSIS — M25551 Pain in right hip: Secondary | ICD-10-CM

## 2021-01-24 DIAGNOSIS — M6281 Muscle weakness (generalized): Secondary | ICD-10-CM

## 2021-01-24 DIAGNOSIS — M25611 Stiffness of right shoulder, not elsewhere classified: Secondary | ICD-10-CM | POA: Diagnosis not present

## 2021-01-24 DIAGNOSIS — R269 Unspecified abnormalities of gait and mobility: Secondary | ICD-10-CM

## 2021-01-24 NOTE — Therapy (Signed)
[facility] PH[facility] MEDICINE [address], [place], [address] Phone: [phone]   Fax:  [phone]  Physical Therapy Evaluation  Patient Details  Name: Hannah Yu MRN: [id number] Date of Birth: 01-Jun-1945 Referring Provider (PT): Richardean Canal, [place]  Encounter Date: 01/24/2021   PT End of Session - 01/24/21 1542     Visit Number 1    Number of Visits 17    Date for PT Re-Evaluation 03/21/21    Authorization Type medicare    Authorization - Visit Number 1    Authorization - Number of Visits 10    PT Start Time 1300    PT Stop Time 1345    PT Time Calculation (min) 45 min    Activity Tolerance Patient tolerated treatment well;Patient limited by pain    Behavior During Therapy [facility] for tasks assessed/performed             Past Medical History:  Diagnosis Date   Acid reflux    Anesthesia complication    Tachycardia previously, now on metoprolol   Arthritis    09/02/2019: per patient "have it real bad in both hands and back"   Hypertension    Sciatica    09/02/2019: has had it for about 3 years    Past Surgical History:  Procedure Laterality Date   ABDOMINAL HYSTERECTOMY     CATARACT EXTRACTION W/ INTRAOCULAR LENS IMPLANT Bilateral 2007   eyes done within 1 month of each other.   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   TOTAL HIP ARTHROPLASTY Right 09/06/2019   Procedure: RIGHT TOTAL HIP ARTHROPLASTY ANTERIOR APPROACH;  Surgeon: Kathryne Hitch, MD;  Location: [facility] OR;  Service: Orthopedics;  Laterality: Right;   TOTAL HIP ARTHROPLASTY Left 07/31/2020   Procedure: LEFT TOTAL HIP ARTHROPLASTY ANTERIOR APPROACH;  Surgeon: Kathryne Hitch, MD;  Location: [facility] OR;  Service: Orthopedics;  Laterality: Left;    There were no vitals filed for this visit.    Subjective Assessment - 01/24/21 1259     Subjective Patient reports to PT  with bilateral hip pain L>R. She has pain right at this area (greater trochanter) that comes and goes with rest. Reports pain is present with walking, standing, stairs, and lifting. Denies clicking, popping, or snapping in hip. No pain at rest, but can wake her up if she rolls onto her L side, and has trouble getting back to sleep. She has 0/10 pain at rest and 8/10 with aggravating motions. Has tried ibuprofen and heat to help with her pain. Limited in activities such as shopping, unable to play her hobby of cornhole, and perform household chores. She has a PMH of bilateral THA L 2022, R 2021, and recent episode of R shouder pain with mass. Pt denies any unexplained weight fluctuation, saddle paresthesia, loss of bowel/bladder function, or unrelenting night pain at this time.    Pertinent History Patient reports to PT with bilateral hip pain L>R. She has pain right at this area (greater trochanter) that comes and goes with rest. Reports pain is present with walking, standing, stairs, and lifting. No pain at rest, but can wake her up if she rolls onto her L side, and has trouble getting back to sleep. She has 0/10 pain at rest and 8/10 with aggravating motions. Has tried ibuprofen and heat to help with her pain. Limited in activities such as shopping, unable to play her hobby  of cornhole, and perform household chores. She has a PMH of bilateral THA L 2022, R 2021, and recent episode of R shouder pain with mass. Pt denies any unexplained weight fluctuation, saddle paresthesia, loss of bowel/bladder function, or unrelenting night pain at this time.    Limitations Other (comment);Walking;Standing;House hold activities   laying on side   How long can you sit comfortably? unlimited    How long can you stand comfortably? 5 min    How long can you walk comfortably? immediately    Patient Stated Goals walking and laying on L side    Currently in Pain? No/denies    Pain Score 8     Pain Location Hip    Pain  Orientation Left    Pain Descriptors / Indicators Shooting;Sharp;Stabbing    Pain Type Chronic pain    Pain Radiating Towards towards L knee    Pain Onset More than a month ago    Pain Frequency Intermittent    Aggravating Factors  standing, walking, lying on side and stairs    Pain Relieving Factors changing position, ibuprofen, heat    Effect of Pain on Daily Activities limits ability to community activities                [facility] PT Assessment - 01/25/21 0916       Assessment   Medical Diagnosis L hip bursitis    Referring Provider (PT) Richardean Canal, PA    Onset Date/Surgical Date --   ~1-2 months ago   Prior Therapy Yes for R shoulder      Precautions   Precautions None      Restrictions   Weight Bearing Restrictions No      Prior Function   Level of Independence Independent      Cognition   Overall Cognitive Status Within Functional Limits for tasks assessed             Lumbar AROM  [facility]  HIP MMT R/L Flex 4+/3 painful Ext 4/4  ADD 4/4 ABD 4/3 painful IR 4/4 ER 4/3 painful  Hip ROM  WFL  but painful with L LE flexion,ER, ABD     Special Tests: Resisted FABER (+) FABER (+) FADDIR(-) Sacral Thrust Test (-) Straight Leg Raise (-)  Muscle Length  R Obers WFL Thomas test [facility] bilat  Observation:  Gait: scissoring gait, bilat Trendelenburg, narrow BOS, with decreased stance time on L LE   Posture: forward head, bilat rounded shoulders, increased lumbar lordosis  Palpation: TTP over bilateral greater trochanters L>R  Sensation: appears intact bilaterally   Test and Measures  : 1.1 m/s  Single leg stance  < 5 sec bilaterally Stairs: step to pattern with SUE use 5xSTS : 13 sec   Objective measurements completed on examination: See above findings.      Ther-Ex PT reviewed the following HEP with patient with patient able to demonstrate a set of the following with min cuing for correction needed. PT educated patient on  parameters of therex (how/when to inc/decrease intensity, frequency, rep/set range, stretch hold time, and purpose of therex) with verbalized understanding.  Standing Hip ABDuction 3 x 8-10 2-3/week  Piriformis Stretch 3 x 30 sec 7/week  Single Leg Standing with single UE support 15-20 sec 2-3/week     PT Education - 01/24/21 1555     Education provided Yes    Education Details Patient was educated on diagnosis, anatomy and pathology involved, prognosis, role of PT, and was given an HEP,  demonstrating exercise with proper form following verbal and tactile cues, and was given a paper hand out to continue exercise at home. Pt was educated on and agreed to plan of care.    Person(s) Educated Patient    Methods Explanation;Demonstration    Comprehension Verbalized understanding;Returned demonstration              PT Short Term Goals - 01/24/21 1543       PT SHORT TERM GOAL #1   Title Pt will be independent with HEP in order to improve strength and decrease pain in order to improve pain-free function at home and community activity.    Baseline 01/24/21: HEP given    Time 4    Period Weeks    Status New               PT Long Term Goals - 01/24/21 1544       PT LONG TERM GOAL #1   Title Patient will increase FOTO score to ** to demonstrate predicted increase in functional mobility to complete ADLs    Baseline 01/24/21: deferred to next session    Time 8    Period Weeks    Status New    Target Date 03/21/21      PT LONG TERM GOAL #2   Title Pt will negotiate 4, 6" stairs with a reciprocal gait pattern, single UE on railing, and without pain to demonstrate increase in functional LE strength and decrease likelihood of falling.    Baseline 01/24/21: step to pattern and painful    Time 8    Period Weeks    Status New    Target Date 03/21/21      PT LONG TERM GOAL #3   Title Pt will improve single leg stance to 20 sec or greater in order to demonstrate increased hip  stability, increased balance, and decrease likelihood of falling.    Baseline 01/24/21: < 5 sec bilaterally    Time 8    Period Weeks    Status New    Target Date 03/21/21      PT LONG TERM GOAL #4   Title Patient will demonstrate gross bilat hip strength of 4+/5 in order to complete heavy household ADLs    Baseline 01/24/21: R LE grossly 4/5 L LE grossly 4/5 except Flex,ER,ABD 3/5 painful    Time 8    Period Weeks    Status New    Target Date 03/21/21                    Plan - 01/25/21 0834     Clinical Impression Statement Patient is a 75 y/o female with hx of BTHA, presenting today with bilat hip pain L>R. Patient presents with impairments in LE strength, LE ROM, abnormal gait, pain and decreased balance. Activity limitations in prolonged walking and standing prohibiting participation in completing ADLs like household chores and recreational/community activities. Patient will benefit from skilled PT to address these impairments to reduce risk of falling.    Personal Factors and Comorbidities Age;Comorbidity 3+;Fitness    Comorbidities HTN, arthritis, sciatica    Examination-Activity Limitations Locomotion Level;Stairs;Lift;Transfers;Stand    Examination-Participation Restrictions Psychologist, forensic;Shop    Stability/Clinical Decision Making Evolving/Moderate complexity    Clinical Decision Making Moderate    Rehab Potential Good    PT Frequency 2x / week    PT Duration 8 weeks    PT Treatment/Interventions Therapeutic activities;Therapeutic exercise;Balance training;Neuromuscular re-education;Patient/family education;Manual techniques;Dry needling;Aquatic Therapy;Electrical Stimulation;Iontophoresis 4mg /ml  Dexamethasone;Gait training;Stair training;Passive range of motion;Joint Manipulations;Spinal Manipulations;Functional mobility training;ADLs/Self Care Home Management;Cryotherapy;Traction;Moist Heat;Ultrasound    PT Next Visit Plan glute med strengthening, ROM     PT Home Exercise Plan SLS,Standing hip ABD, piriformis stretch    Consulted and Agree with Plan of Care Patient             Patient will benefit from skilled therapeutic intervention in order to improve the following deficits and impairments:  Abnormal gait, Decreased balance, Decreased endurance, Decreased mobility, Difficulty walking, Hypomobility, Obesity, Improper body mechanics, Decreased range of motion, Decreased activity tolerance, Decreased strength, Increased fascial restricitons, Impaired flexibility, Postural dysfunction, Pain  Visit Diagnosis: Bilateral hip pain  Muscle weakness (generalized)  Abnormality of gait and mobility     Problem List Patient Active Problem List   Diagnosis Date Noted   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   Unilateral primary osteoarthritis, left hip 04/25/2020   Status post total replacement of right hip 09/06/2019   Pelvic pain in female 07/20/2019   Hyperlipidemia 07/20/2019   B12 deficiency 07/20/2019   Neuropathic pain of left hand 06/23/2017   Spinal stenosis of lumbosacral region 11/11/2016   Primary osteoarthritis of right hip 11/11/2016   Obesity (BMI 30.0-34.9) 09/12/2016   H/O supraventricular tachycardia 01/14/2016   Hypertension 12/24/2015   Back pain with right-sided sciatica 12/24/2015   Gallstone 10/19/2015   Biliary colic 10/19/2015   Diverticulitis 06/28/2014   Romilda Joy, SPT   Delphia Grates. Fairly IV, PT, DPT Physical Therapist- [place]  [facility]  01/25/2021, 9:26 AM  [facility] PHYSICAL AND SPORTS MEDICINE [address], [place], [address] Phone: [phone]   Fax:  [phone]  Name: Hannah Yu MRN: [id number] Date of Birth: 1946-03-19

## 2021-01-29 ENCOUNTER — Encounter: Payer: Self-pay | Admitting: Physical Therapy

## 2021-01-29 ENCOUNTER — Ambulatory Visit: Payer: Medicare Other | Admitting: Physical Therapy

## 2021-01-29 DIAGNOSIS — M25611 Stiffness of right shoulder, not elsewhere classified: Secondary | ICD-10-CM | POA: Diagnosis not present

## 2021-01-29 DIAGNOSIS — M25551 Pain in right hip: Secondary | ICD-10-CM

## 2021-01-29 DIAGNOSIS — M25552 Pain in left hip: Secondary | ICD-10-CM

## 2021-01-29 NOTE — Therapy (Signed)
[facility] [address] [place], [address] Phone: [phone]   Fax:  [phone]  Physical Therapy Treatment  Patient Details  Name: Hannah Yu MRN: [id number] Date of Birth: [birth year] Referring Provider (PT): Richardean Canal, [place]   Encounter Date: 01/29/2021   PT End of Session - 01/29/21 1427     Visit Number 2    Number of Visits 17    Date for PT Re-Evaluation 03/21/21    Authorization Type medicare    Authorization Time Period of 10 progress report    Authorization - Visit Number 2    Authorization - Number of Visits 10    PT Start Time 1345    PT Stop Time 1424    PT Time Calculation (min) 39 min    Activity Tolerance Patient tolerated treatment well;Patient limited by pain    Behavior During Therapy [facility] for tasks assessed/performed             Past Medical History:  Diagnosis Date   Acid reflux    Anesthesia complication    Tachycardia previously, now on metoprolol   Arthritis    09/02/2019: per patient "have it real bad in both hands and back"   Hypertension    Sciatica    09/02/2019: has had it for about 3 years    Past Surgical History:  Procedure Laterality Date   ABDOMINAL HYSTERECTOMY     CATARACT EXTRACTION W/ INTRAOCULAR LENS IMPLANT Bilateral 2007   eyes done within 1 month of each other.   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   TOTAL HIP ARTHROPLASTY Right 09/06/2019   Procedure: RIGHT TOTAL HIP ARTHROPLASTY ANTERIOR APPROACH;  Surgeon: Kathryne Hitch, MD;  Location: [facility] OR;  Service: Orthopedics;  Laterality: Right;   TOTAL HIP ARTHROPLASTY Left 07/31/2020   Procedure: LEFT TOTAL HIP ARTHROPLASTY ANTERIOR APPROACH;  Surgeon: Kathryne Hitch, MD;  Location: [facility] OR;  Service: Orthopedics;  Laterality: Left;    There were no vitals filed for this visit.   Subjective Assessment -  01/29/21 1347     Subjective Pt denies any pain at rest. She reports active participation with HEP. She reports only mild pain with piriformis stretch.    Pertinent History Patient reports to PT with bilateral hip pain L>R. She has pain right at this area (greater trochanter) that comes and goes with rest. Reports pain is present with walking, standing, stairs, and lifting. No pain at rest, but can wake her up if she rolls onto her L side, and has trouble getting back to sleep. She has 0/10 pain at rest and 8/10 with aggravating motions. Has tried ibuprofen and heat to help with her pain. Limited in activities such as shopping, unable to play her hobby of cornhole, and perform household chores. She has a PMH of bilateral THA L 2022, R 2021, and recent episode of R shouder pain with mass. Pt denies any unexplained weight fluctuation, saddle paresthesia, loss of bowel/bladder function, or unrelenting night pain at this time.    Limitations Other (comment);Walking;Standing;House hold activities    How long can you sit comfortably? unlimited    How long can you stand comfortably? 5 min    How long can you walk comfortably? immediately    Diagnostic tests MRI R shoulder unremarkable, did demonstrate swelling, [place] as well    Patient Stated Goals walking and laying on L side  Therex  Nu Step L1 x 5 min gentle LE strengthening  Standing ABD 2 x10 reps with cues to keep toe pointed straight and perform slowly; Single UE support  Glute Bridging with ABD RTB 2 x 8 reps with cues to hold 3 sec   Single Leg Stance with one finger support 2 x 15 sec  Alternating Toe tapping on treadmill 3 x 30 sec 1 trial at each height 4",6",8" step ; 6 inch step was challenging but not painful; Single UE support  Seated ADDuction squeeze with ball 3 x 15 sec   Seated isometric FABER 3 x 15 sec to tolerance for reduced pain and increased ROM with ER stretch  Piriformis  (ER) Stretch 3 x 30  sec                                PT Education - 01/29/21 1428     Education provided Yes    Education Details therex form/technique    Person(s) Educated Patient    Methods Explanation;Demonstration    Comprehension Verbalized understanding;Returned demonstration              PT Short Term Goals - 01/24/21 1543       PT SHORT TERM GOAL #1   Title Pt will be independent with HEP in order to improve strength and decrease pain in order to improve pain-free function at home and community activity.    Baseline 01/24/21: HEP given    Time 4    Period Weeks    Status New               PT Long Term Goals - 01/24/21 1544       PT LONG TERM GOAL #1   Title Patient will increase FOTO score to ** to demonstrate predicted increase in functional mobility to complete ADLs    Baseline 01/24/21: deferred to next session    Time 8    Period Weeks    Status New    Target Date 03/21/21      PT LONG TERM GOAL #2   Title Pt will negotiate 4, 6" stairs with a reciprocal gait pattern, single UE on railing, and without pain to demonstrate increase in functional LE strength and decrease likelihood of falling.    Baseline 01/24/21: step to pattern and painful    Time 8    Period Weeks    Status New    Target Date 03/21/21      PT LONG TERM GOAL #3   Title Pt will improve single leg stance to 20 sec or greater in order to demonstrate increased hip stability, increased balance, and decrease likelihood of falling.    Baseline 01/24/21: < 5 sec bilaterally    Time 8    Period Weeks    Status New    Target Date 03/21/21      PT LONG TERM GOAL #4   Title Patient will demonstrate gross bilat hip strength of 4+/5 in order to complete heavy household ADLs    Baseline 01/24/21: R LE grossly 4/5 L LE grossly 4/5 except Flex,ER,ABD 3/5 painful    Time 8    Period Weeks    Status New    Target Date 03/21/21                   Plan - 01/30/21 0805      Clinical Impression Statement PT initiated  hip stability and LE strengthening this session with emphasis on single leg and balance activities. Pt tolerated session well with PT modifying activities throughout session to limit pain. Pt continues to demonstrate hip pain, decreased hip strength, and activity tolerance. Patient will continue to benefit from skilled therapy for exercise performance to ensure proper technique for safe and effective progression.    Personal Factors and Comorbidities Age;Comorbidity 3+;Fitness    Comorbidities HTN, arthritis, sciatica    Examination-Activity Limitations Locomotion Level;Stairs;Lift;Transfers;Stand    Examination-Participation Restrictions Psychologist, forensic;Shop    Stability/Clinical Decision Making Evolving/Moderate complexity    Clinical Decision Making Moderate    Rehab Potential Good    PT Frequency 2x / week    PT Duration 8 weeks    PT Treatment/Interventions Therapeutic activities;Therapeutic exercise;Balance training;Neuromuscular re-education;Patient/family education;Manual techniques;Dry needling;Aquatic Therapy;Electrical Stimulation;Iontophoresis 4mg /ml Dexamethasone;Gait training;Stair training;Passive range of motion;Joint Manipulations;Spinal Manipulations;Functional mobility training;ADLs/Self Care Home Management;Cryotherapy;Traction;Moist Heat;Ultrasound    PT Next Visit Plan glute med strengthening, ROM    PT Home Exercise Plan SLS,Standing hip ABD, piriformis stretch    Consulted and Agree with Plan of Care Patient             Patient will benefit from skilled therapeutic intervention in order to improve the following deficits and impairments:  Abnormal gait, Decreased balance, Decreased endurance, Decreased mobility, Difficulty walking, Hypomobility, Obesity, Improper body mechanics, Decreased range of motion, Decreased activity tolerance, Decreased strength, Increased fascial restricitons, Impaired flexibility, Postural  dysfunction, Pain  Visit Diagnosis: Bilateral hip pain     Problem List Patient Active Problem List   Diagnosis Date Noted   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   Unilateral primary osteoarthritis, left hip 04/25/2020   Status post total replacement of right hip 09/06/2019   Pelvic pain in female 07/20/2019   Hyperlipidemia 07/20/2019   B12 deficiency 07/20/2019   Neuropathic pain of left hand 06/23/2017   Spinal stenosis of lumbosacral region 11/11/2016   Primary osteoarthritis of right hip 11/11/2016   Obesity (BMI 30.0-34.9) 09/12/2016   H/O supraventricular tachycardia 01/14/2016   Hypertension 12/24/2015   Back pain with right-sided sciatica 12/24/2015   Gallstone 10/19/2015   Biliary colic 10/19/2015   Diverticulitis 06/28/2014     06/30/2014 DPT Hilda Lias, SPT  Romilda Joy, Student-PT 01/30/2021, 8:10 AM  [facility] PH[facility]TS MEDICINE [address], [address], [place] Phone: [phone]   Fax:  [phone]  Name: Hannah Yu MRN: [id number] Field Date of Birth: 08/23/1945

## 2021-01-31 ENCOUNTER — Other Ambulatory Visit: Payer: Self-pay

## 2021-01-31 ENCOUNTER — Encounter: Payer: Self-pay | Admitting: Physical Therapy

## 2021-01-31 ENCOUNTER — Ambulatory Visit: Payer: Medicare Other | Admitting: Physical Therapy

## 2021-01-31 DIAGNOSIS — M25552 Pain in left hip: Secondary | ICD-10-CM

## 2021-01-31 DIAGNOSIS — M25611 Stiffness of right shoulder, not elsewhere classified: Secondary | ICD-10-CM | POA: Diagnosis not present

## 2021-01-31 DIAGNOSIS — M25551 Pain in right hip: Secondary | ICD-10-CM

## 2021-01-31 NOTE — Therapy (Signed)
[place] [facility] [address], [place], [address] Phone: [phone]   Fax:  [phone]  Physical Therapy Treatment  Patient Details  Name: Hannah Yu MRN: [id number] Date of Birth: 1945/07/07 Referring Provider (PT): Richardean Canal, [place]   Encounter Date: 01/31/2021   PT End of Session - 01/31/21 1259     Visit Number 3    Number of Visits 17    Date for PT Re-Evaluation 03/21/21    Authorization Type medicare    Authorization - Visit Number 3    Authorization - Number of Visits 10    PT Start Time 1300    PT Stop Time 1339    PT Time Calculation (min) 39 min    Equipment Utilized During Treatment Gait belt    Activity Tolerance Patient tolerated treatment well    Behavior During Therapy [facility] for tasks assessed/performed             Past Medical History:  Diagnosis Date   Acid reflux    Anesthesia complication    Tachycardia previously, now on metoprolol   Arthritis    09/02/2019: per patient "have it real bad in both hands and back"   Hypertension    Sciatica    09/02/2019: has had it for about 3 years    Past Surgical History:  Procedure Laterality Date   ABDOMINAL HYSTERECTOMY     CATARACT EXTRACTION W/ INTRAOCULAR LENS IMPLANT Bilateral 2007   eyes done within 1 month of each other.   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   TOTAL HIP ARTHROPLASTY Right 09/06/2019   Procedure: RIGHT TOTAL HIP ARTHROPLASTY ANTERIOR APPROACH;  Surgeon: Kathryne Hitch, MD;  Location: [facility] OR;  Service: Orthopedics;  Laterality: Right;   TOTAL HIP ARTHROPLASTY Left 07/31/2020   Procedure: LEFT TOTAL HIP ARTHROPLASTY ANTERIOR APPROACH;  Surgeon: Kathryne Hitch, MD;  Location: [facility] OR;  Service: Orthopedics;  Laterality: Left;    There were no vitals filed for this visit.   Subjective Assessment - 01/31/21 1302     Subjective  Pt denies any pain at rest. She reports having less pain with walking.  She reports active participation with HEP. She reports only mild pain with piriformis stretch.    Pertinent History Patient reports to PT with bilateral hip pain L>R. She has pain right at this area (greater trochanter) that comes and goes with rest. Reports pain is present with walking, standing, stairs, and lifting. No pain at rest, but can wake her up if she rolls onto her L side, and has trouble getting back to sleep. She has 0/10 pain at rest and 8/10 with aggravating motions. Has tried ibuprofen and heat to help with her pain. Limited in activities such as shopping, unable to play her hobby of cornhole, and perform household chores. She has a PMH of bilateral THA L 2022, R 2021, and recent episode of R shouder pain with mass. Pt denies any unexplained weight fluctuation, saddle paresthesia, loss of bowel/bladder function, or unrelenting night pain at this time.    Limitations Other (comment);Walking;Standing;House hold activities    How long can you sit comfortably? unlimited    How long can you stand comfortably? 5 min    How long can you walk comfortably? immediately    Diagnostic tests MRI R shoulder unremarkable, did demonstrate swelling, [place] as well  Therex   Nu Step L1 x 5 min gentle LE strengthening   Glute Bridging with airex under feet with hand placement on lateral knees for ABD 3 x 8 reps with cues to hold 3 sec   Stepping onto airex over hurdles 2 x 5 reps FWD/BWD leading with each LE  Mini FWD Lunges 1 x 8 reps BLE with cues to limit knee over toes   TRX Mni Squats 2 x 10 reps with emphasis on eccentric control   Tall kneeling Psoas stretch 2 x 30sec; on airex pad holding onto chair                          PT Education - 01/31/21 1255     Education provided Yes    Education Details therex form/technique    Person(s) Educated Patient    Methods  Explanation;Demonstration    Comprehension Verbalized understanding;Returned demonstration              PT Short Term Goals - 01/24/21 1543       PT SHORT TERM GOAL #1   Title Pt will be independent with HEP in order to improve strength and decrease pain in order to improve pain-free function at home and community activity.    Baseline 01/24/21: HEP given    Time 4    Period Weeks    Status New               PT Long Term Goals - 01/24/21 1544       PT LONG TERM GOAL #1   Title Patient will increase FOTO score to ** to demonstrate predicted increase in functional mobility to complete ADLs    Baseline 01/24/21: deferred to next session    Time 8    Period Weeks    Status New    Target Date 03/21/21      PT LONG TERM GOAL #2   Title Pt will negotiate 4, 6" stairs with a reciprocal gait pattern, single UE on railing, and without pain to demonstrate increase in functional LE strength and decrease likelihood of falling.    Baseline 01/24/21: step to pattern and painful    Time 8    Period Weeks    Status New    Target Date 03/21/21      PT LONG TERM GOAL #3   Title Pt will improve single leg stance to 20 sec or greater in order to demonstrate increased hip stability, increased balance, and decrease likelihood of falling.    Baseline 01/24/21: < 5 sec bilaterally    Time 8    Period Weeks    Status New    Target Date 03/21/21      PT LONG TERM GOAL #4   Title Patient will demonstrate gross bilat hip strength of 4+/5 in order to complete heavy household ADLs    Baseline 01/24/21: R LE grossly 4/5 L LE grossly 4/5 except Flex,ER,ABD 3/5 painful    Time 8    Period Weeks    Status New    Target Date 03/21/21                   Plan - 01/31/21 1623     Clinical Impression Statement PT continued hip stability and LE strengthening this session with emphasis on single leg balance and eccentric control. Pt tolerated session well with PT able to increase difficulty  of activities. Pt continues to demonstrate hip pain,  decreased hip strength, and activity tolerance. Patient will continue to benefit from skilled therapy for exercise performance to ensure proper technique for safe and effective progression.    Personal Factors and Comorbidities Age;Comorbidity 3+;Fitness    Comorbidities HTN, arthritis, sciatica    Examination-Activity Limitations Locomotion Level;Stairs;Lift;Transfers;Stand    Examination-Participation Restrictions Psychologist, forensic;Shop    Stability/Clinical Decision Making Evolving/Moderate complexity    Clinical Decision Making Moderate    Rehab Potential Good    PT Frequency 2x / week    PT Duration 8 weeks    PT Treatment/Interventions Therapeutic activities;Therapeutic exercise;Balance training;Neuromuscular re-education;Patient/family education;Manual techniques;Dry needling;Aquatic Therapy;Electrical Stimulation;Iontophoresis 4mg /ml Dexamethasone;Gait training;Stair training;Passive range of motion;Joint Manipulations;Spinal Manipulations;Functional mobility training;ADLs/Self Care Home Management;Cryotherapy;Traction;Moist Heat;Ultrasound    PT Next Visit Plan glute med strengthening, ROM    PT Home Exercise Plan SLS,Standing hip ABD, piriformis stretch    Consulted and Agree with Plan of Care Patient             Patient will benefit from skilled therapeutic intervention in order to improve the following deficits and impairments:  Abnormal gait, Decreased balance, Decreased endurance, Decreased mobility, Difficulty walking, Hypomobility, Obesity, Improper body mechanics, Decreased range of motion, Decreased activity tolerance, Decreased strength, Increased fascial restricitons, Impaired flexibility, Postural dysfunction, Pain  Visit Diagnosis: Bilateral hip pain     Problem List Patient Active Problem List   Diagnosis Date Noted   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   Unilateral primary  osteoarthritis, left hip 04/25/2020   Status post total replacement of right hip 09/06/2019   Pelvic pain in female 07/20/2019   Hyperlipidemia 07/20/2019   B12 deficiency 07/20/2019   Neuropathic pain of left hand 06/23/2017   Spinal stenosis of lumbosacral region 11/11/2016   Primary osteoarthritis of right hip 11/11/2016   Obesity (BMI 30.0-34.9) 09/12/2016   H/O supraventricular tachycardia 01/14/2016   Hypertension 12/24/2015   Back pain with right-sided sciatica 12/24/2015   Gallstone 10/19/2015   Biliary colic 10/19/2015   Diverticulitis 06/28/2014    06/30/2014 DPT Hilda Lias, SPT  Romilda Joy, PT 02/01/2021, 8:22 AM  [place] [facility] [address], [address], [place] Phone: [phone]   Fax:  [phone]  Name: Hannah Yu MRN: [id number] Field Date of Birth: Sep 15, 1945

## 2021-02-06 ENCOUNTER — Encounter: Payer: Self-pay | Admitting: Physical Therapy

## 2021-02-06 ENCOUNTER — Ambulatory Visit: Payer: Medicare Other | Attending: Physician Assistant | Admitting: Physical Therapy

## 2021-02-06 DIAGNOSIS — M25552 Pain in left hip: Secondary | ICD-10-CM | POA: Diagnosis present

## 2021-02-06 DIAGNOSIS — M25551 Pain in right hip: Secondary | ICD-10-CM | POA: Insufficient documentation

## 2021-02-06 NOTE — Therapy (Signed)
[place] [facility] [address], [place], [address] Phone: [phone]   Fax:  [phone]  Physical Therapy Treatment  Patient Details  Name: Hannah Yu MRN: [id number] Date of Birth: 06/18/1945 Referring Provider (PT): Richardean Canal, [place]   Encounter Date: 02/06/2021   PT End of Session - 02/06/21 1111     Visit Number 4    Number of Visits 17    Date for PT Re-Evaluation 03/21/21    Authorization Type medicare    Authorization - Visit Number 4    Authorization - Number of Visits 10    PT Start Time 1030    PT Stop Time 1108    PT Time Calculation (min) 38 min    Equipment Utilized During Treatment Gait belt    Activity Tolerance Patient tolerated treatment well    Behavior During Therapy [facility] for tasks assessed/performed             Past Medical History:  Diagnosis Date   Acid reflux    Anesthesia complication    Tachycardia previously, now on metoprolol   Arthritis    09/02/2019: per patient "have it real bad in both hands and back"   Hypertension    Sciatica    09/02/2019: has had it for about 3 years    Past Surgical History:  Procedure Laterality Date   ABDOMINAL HYSTERECTOMY     CATARACT EXTRACTION W/ INTRAOCULAR LENS IMPLANT Bilateral 2007   eyes done within 1 month of each other.   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   TOTAL HIP ARTHROPLASTY Right 09/06/2019   Procedure: RIGHT TOTAL HIP ARTHROPLASTY ANTERIOR APPROACH;  Surgeon: Kathryne Hitch, MD;  [facility]: [facility];  Service: Orthopedics;  Laterality: Right;   TOTAL HIP ARTHROPLASTY Left 07/31/2020   Procedure: LEFT TOTAL HIP ARTHROPLASTY ANTERIOR APPROACH;  Surgeon: Kathryne Hitch, MD;  Location: [facility] OR;  Service: Orthopedics;  Laterality: Left;    There were no vitals filed for this visit.   Subjective Assessment - 02/06/21 1032     Subjective  Pt denies any pain at rest. She reports continueing to have pain with walking.  She reports active participation with HEP.    Pertinent History Patient reports to PT with bilateral hip pain L>R. She has pain right at this area (greater trochanter) that comes and goes with rest. Reports pain is present with walking, standing, stairs, and lifting. No pain at rest, but can wake her up if she rolls onto her L side, and has trouble getting back to sleep. She has 0/10 pain at rest and 8/10 with aggravating motions. Has tried ibuprofen and heat to help with her pain. Limited in activities such as shopping, unable to play her hobby of cornhole, and perform household chores. She has a PMH of bilateral THA L 2022, R 2021, and recent episode of R shouder pain with mass. Pt denies any unexplained weight fluctuation, saddle paresthesia, loss of bowel/bladder function, or unrelenting night pain at this time.    Limitations Other (comment);Walking;Standing;House hold activities    How long can you sit comfortably? unlimited    How long can you stand comfortably? 5 min    How long can you walk comfortably? immediately    Diagnostic tests MRI R shoulder unremarkable, did demonstrate swelling, [place] as well    Patient Stated Goals walking and laying on L side  Therex   Nu Step L1 x 5 min gentle LE strengthening  Step up on 4in step with hip flex 2 x 10 reps BLE   Stepping onto airex over hurdles 1 x 8 reps LAT leading with each LE BUE support   Mini FWD Lunges 1 x 8 reps BLE with cues to limit knee over toes    TRX Mni Squats 2 x 10 reps with emphasis on eccentric control  Seated ADDuction squeeze with ball 3 x 15 sec   Seated Hip Abduction RTB 3 x 10 RTB  Heel Raises 2 x 12 with UE support  Piriformis  (ER) Stretch 2 x 30 sec B LE                              PT Education - 02/06/21 1131     Education provided Yes    Education Details therex form/technique     Person(s) Educated Patient    Methods Explanation;Demonstration    Comprehension Verbalized understanding;Returned demonstration              PT Short Term Goals - 01/24/21 1543       PT SHORT TERM GOAL #1   Title Pt will be independent with HEP in order to improve strength and decrease pain in order to improve pain-free function at home and community activity.    Baseline 01/24/21: HEP given    Time 4    Period Weeks    Status New               PT Long Term Goals - 01/24/21 1544       PT LONG TERM GOAL #1   Title Patient will increase FOTO score to ** to demonstrate predicted increase in functional mobility to complete ADLs    Baseline 01/24/21: deferred to next session    Time 8    Period Weeks    Status New    Target Date 03/21/21      PT LONG TERM GOAL #2   Title Pt will negotiate 4, 6" stairs with a reciprocal gait pattern, single UE on railing, and without pain to demonstrate increase in functional LE strength and decrease likelihood of falling.    Baseline 01/24/21: step to pattern and painful    Time 8    Period Weeks    Status New    Target Date 03/21/21      PT LONG TERM GOAL #3   Title Pt will improve single leg stance to 20 sec or greater in order to demonstrate increased hip stability, increased balance, and decrease likelihood of falling.    Baseline 01/24/21: < 5 sec bilaterally    Time 8    Period Weeks    Status New    Target Date 03/21/21      PT LONG TERM GOAL #4   Title Patient will demonstrate gross bilat hip strength of 4+/5 in order to complete heavy household ADLs    Baseline 01/24/21: R LE grossly 4/5 L LE grossly 4/5 except Flex,ER,ABD 3/5 painful    Time 8    Period Weeks    Status New    Target Date 03/21/21                   Plan - 02/06/21 1110     Clinical Impression Statement PT continued hip stability and LE strengthening this session with emphasis on dynamic balance and eccentric  control.  Pt continues to  demonstrate hip pain, decreased hip strength, and activity tolerance. Patient will continue to benefit from skilled therapy for exercise performance to ensure proper technique for safe and effective progression.    Personal Factors and Comorbidities Age;Comorbidity 3+;Fitness    Comorbidities HTN, arthritis, sciatica    Examination-Activity Limitations Locomotion Level;Stairs;Lift;Transfers;Stand    Examination-Participation Restrictions Psychologist, forensic;Shop    Stability/Clinical Decision Making Evolving/Moderate complexity    Clinical Decision Making Moderate    Rehab Potential Good    PT Frequency 2x / week    PT Duration 8 weeks    PT Treatment/Interventions Therapeutic activities;Therapeutic exercise;Balance training;Neuromuscular re-education;Patient/family education;Manual techniques;Dry needling;Aquatic Therapy;Electrical Stimulation;Iontophoresis 4mg /ml Dexamethasone;Gait training;Stair training;Passive range of motion;Joint Manipulations;Spinal Manipulations;Functional mobility training;ADLs/Self Care Home Management;Cryotherapy;Traction;Moist Heat;Ultrasound    PT Next Visit Plan glute med strengthening, ROM    PT Home Exercise Plan SLS,Standing hip ABD, piriformis stretch    Consulted and Agree with Plan of Care Patient             Patient will benefit from skilled therapeutic intervention in order to improve the following deficits and impairments:  Abnormal gait, Decreased balance, Decreased endurance, Decreased mobility, Difficulty walking, Hypomobility, Obesity, Improper body mechanics, Decreased range of motion, Decreased activity tolerance, Decreased strength, Increased fascial restricitons, Impaired flexibility, Postural dysfunction, Pain  Visit Diagnosis: Bilateral hip pain     Problem List Patient Active Problem List   Diagnosis Date Noted   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   Unilateral primary osteoarthritis, left hip 04/25/2020    Status post total replacement of right hip 09/06/2019   Pelvic pain in female 07/20/2019   Hyperlipidemia 07/20/2019   B12 deficiency 07/20/2019   Neuropathic pain of left hand 06/23/2017   Spinal stenosis of lumbosacral region 11/11/2016   Primary osteoarthritis of right hip 11/11/2016   Obesity (BMI 30.0-34.9) 09/12/2016   H/O supraventricular tachycardia 01/14/2016   Hypertension 12/24/2015   Back pain with right-sided sciatica 12/24/2015   Gallstone 10/19/2015   Biliary colic 10/19/2015   Diverticulitis 06/28/2014     06/30/2014 DPT Hilda Lias, SPT  Romilda Joy, PT 02/06/2021, 1:44 PM  [place] [facility] [address], [address], [place] Phone: [phone]   Fax:  [phone]  Name: Hannah Yu MRN: [id number] Field Date of Birth: July 06, 1945

## 2021-02-08 ENCOUNTER — Encounter: Payer: Medicare Other | Admitting: Physical Therapy

## 2021-02-13 ENCOUNTER — Encounter: Payer: Medicare Other | Admitting: Physical Therapy

## 2021-02-15 ENCOUNTER — Encounter: Payer: Medicare Other | Admitting: Physical Therapy

## 2021-02-18 ENCOUNTER — Ambulatory Visit: Payer: Medicare Other | Admitting: Physician Assistant

## 2021-02-19 ENCOUNTER — Ambulatory Visit: Payer: Medicare Other | Admitting: Physical Therapy

## 2021-02-21 ENCOUNTER — Ambulatory Visit: Payer: Medicare Other | Admitting: Physical Therapy

## 2021-02-21 ENCOUNTER — Encounter: Payer: Self-pay | Admitting: Physical Therapy

## 2021-02-21 DIAGNOSIS — M25551 Pain in right hip: Secondary | ICD-10-CM | POA: Diagnosis not present

## 2021-02-21 DIAGNOSIS — M25552 Pain in left hip: Secondary | ICD-10-CM

## 2021-02-21 NOTE — Therapy (Signed)
[facility] [address], [place], [address] Phone: [phone]   Fax:  [phone]  Physical Therapy Treatment  Patient Details  Name: Hannah Yu MRN: [id number] Date of Birth: [birth year] Referring Provider (PT): Richardean Canal, [place]   Encounter Date: 02/21/2021   PT End of Session - 02/21/21 1608     Visit Number 5    Number of Visits 17    Date for PT Re-Evaluation 03/21/21    Authorization Type medicare    Authorization - Visit Number 5    Authorization - Number of Visits 10    PT Start Time 1600    PT Stop Time 1638    PT Time Calculation (min) 38 min    Activity Tolerance Patient tolerated treatment well    Behavior During Therapy [facility] for tasks assessed/performed             Past Medical History:  Diagnosis Date   Acid reflux    Anesthesia complication    Tachycardia previously, now on metoprolol   Arthritis    09/02/2019: per patient "have it real bad in both hands and back"   Hypertension    Sciatica    09/02/2019: has had it for about 3 years    Past Surgical History:  Procedure Laterality Date   ABDOMINAL HYSTERECTOMY     CATARACT EXTRACTION W/ INTRAOCULAR LENS IMPLANT Bilateral 2007   eyes done within 1 month of each other.   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   TOTAL HIP ARTHROPLASTY Right 09/06/2019   Procedure: RIGHT TOTAL HIP ARTHROPLASTY ANTERIOR APPROACH;  Surgeon: Kathryne Hitch, MD;  Location: [facility] OR;  Service: Orthopedics;  Laterality: Right;   TOTAL HIP ARTHROPLASTY Left 07/31/2020   Procedure: LEFT TOTAL HIP ARTHROPLASTY ANTERIOR APPROACH;  Surgeon: Kathryne Hitch, MD;  Location: [facility] OR;  Service: Orthopedics;  Laterality: Left;    There were no vitals filed for this visit.   Subjective Assessment - 02/21/21 1605     Subjective Pt reports decrease in bilateral hip pain. She  reports being able to tolerate side lying on both sides and her sleep has improved. She only reports fatigue in anterior thigh musculature.    Pertinent History Patient reports to PT with bilateral hip pain L>R. She has pain right at this area (greater trochanter) that comes and goes with rest. Reports pain is present with walking, standing, stairs, and lifting. No pain at rest, but can wake her up if she rolls onto her L side, and has trouble getting back to sleep. She has 0/10 pain at rest and 8/10 with aggravating motions. Has tried ibuprofen and heat to help with her pain. Limited in activities such as shopping, unable to play her hobby of cornhole, and perform household chores. She has a PMH of bilateral THA L 2022, R 2021, and recent episode of R shouder pain with mass. Pt denies any unexplained weight fluctuation, saddle paresthesia, loss of bowel/bladder function, or unrelenting night pain at this time.    Limitations Other (comment);Walking;Standing;House hold activities    How long can you sit comfortably? unlimited    How long can you stand comfortably? 5 min    How long can you walk comfortably? immediately    Diagnostic tests MRI R shoulder unremarkable, did demonstrate swelling, [place] as well    Patient Stated Goals walking and laying on L side  Therex   Nu Step L1 x 5 min gentle LE strengthening  Mini Squat with swiss ball on wall with YTB above knee 2 x 8 reps; theraband as cue to prevent genu valgus   Lateral Step up/downs 4" step 2 x 8 reps BLE With cues to control descent; eccentric control with single UE support  Heel raises on airex pad 2 x 10 reps with BUE support   Seated ADDuction squeeze with ball 3 x 25 sec    Seated Hip Abduction RTB 3 x 10 reps   Piriformis  (ER) Stretch 2 x 30 sec B LE          PT Education - 02/21/21 1607     Education provided Yes    Education Details therex form/technique    Person(s) Educated Patient    Methods  Explanation;Demonstration    Comprehension Verbalized understanding              PT Short Term Goals - 01/24/21 1543       PT SHORT TERM GOAL #1   Title Pt will be independent with HEP in order to improve strength and decrease pain in order to improve pain-free function at home and community activity.    Baseline 01/24/21: HEP given    Time 4    Period Weeks    Status New               PT Long Term Goals - 01/24/21 1544       PT LONG TERM GOAL #1   Title Patient will increase FOTO score to ** to demonstrate predicted increase in functional mobility to complete ADLs    Baseline 01/24/21: deferred to next session    Time 8    Period Weeks    Status New    Target Date 03/21/21      PT LONG TERM GOAL #2   Title Pt will negotiate 4, 6" stairs with a reciprocal gait pattern, single UE on railing, and without pain to demonstrate increase in functional LE strength and decrease likelihood of falling.    Baseline 01/24/21: step to pattern and painful    Time 8    Period Weeks    Status New    Target Date 03/21/21      PT LONG TERM GOAL #3   Title Pt will improve single leg stance to 20 sec or greater in order to demonstrate increased hip stability, increased balance, and decrease likelihood of falling.    Baseline 01/24/21: < 5 sec bilaterally    Time 8    Period Weeks    Status New    Target Date 03/21/21      PT LONG TERM GOAL #4   Title Patient will demonstrate gross bilat hip strength of 4+/5 in order to complete heavy household ADLs    Baseline 01/24/21: R LE grossly 4/5 L LE grossly 4/5 except Flex,ER,ABD 3/5 painful    Time 8    Period Weeks    Status New    Target Date 03/21/21                   Plan - 02/21/21 1654     Clinical Impression Statement PT continued hip stability and LE strengthening this session with emphasis on eccentric control.  Pt continues to demonstrate hip pain, decreased hip strength, and activity tolerance evidenced by knee  valgus and frequent need for rest breaks. Patient will continue to benefit from skilled therapy for exercise  performance to ensure proper technique for safe and effective progression.    Personal Factors and Comorbidities Age;Comorbidity 3+;Fitness    Comorbidities HTN, arthritis, sciatica    Examination-Activity Limitations Locomotion Level;Stairs;Lift;Transfers;Stand    Examination-Participation Restrictions Psychologist, forensic;Shop    Stability/Clinical Decision Making Evolving/Moderate complexity    Clinical Decision Making Moderate    Rehab Potential Good    PT Frequency 2x / week    PT Duration 8 weeks    PT Treatment/Interventions Therapeutic activities;Therapeutic exercise;Balance training;Neuromuscular re-education;Patient/family education;Manual techniques;Dry needling;Aquatic Therapy;Electrical Stimulation;Iontophoresis 4mg /ml Dexamethasone;Gait training;Stair training;Passive range of motion;Joint Manipulations;Spinal Manipulations;Functional mobility training;ADLs/Self Care Home Management;Cryotherapy;Traction;Moist Heat;Ultrasound    PT Next Visit Plan glute med strengthening, ROM    PT Home Exercise Plan SLS,Standing hip ABD, piriformis stretch    Consulted and Agree with Plan of Care Patient             Patient will benefit from skilled therapeutic intervention in order to improve the following deficits and impairments:  Abnormal gait, Decreased balance, Decreased endurance, Decreased mobility, Difficulty walking, Hypomobility, Obesity, Improper body mechanics, Decreased range of motion, Decreased activity tolerance, Decreased strength, Increased fascial restricitons, Impaired flexibility, Postural dysfunction, Pain  Visit Diagnosis: Bilateral hip pain     Problem List Patient Active Problem List   Diagnosis Date Noted   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   Unilateral primary osteoarthritis, left hip 04/25/2020   Status post total  replacement of right hip 09/06/2019   Pelvic pain in female 07/20/2019   Hyperlipidemia 07/20/2019   B12 deficiency 07/20/2019   Neuropathic pain of left hand 06/23/2017   Spinal stenosis of lumbosacral region 11/11/2016   Primary osteoarthritis of right hip 11/11/2016   Obesity (BMI 30.0-34.9) 09/12/2016   H/O supraventricular tachycardia 01/14/2016   Hypertension 12/24/2015   Back pain with right-sided sciatica 12/24/2015   Gallstone 10/19/2015   Biliary colic 10/19/2015   Diverticulitis 06/28/2014      06/30/2014 DPT  Hilda Lias, SPT  Romilda Joy, PT 02/22/2021, 9:51 AM  [facility] [address], [address], [place] Phone: [phone]   Fax:  [phone]  Name: DANYELL SHADER MRN: Evalina Field Date of Birth: 1946-01-15

## 2021-02-26 ENCOUNTER — Ambulatory Visit: Payer: Medicare Other | Admitting: Physical Therapy

## 2021-02-26 ENCOUNTER — Encounter: Payer: Self-pay | Admitting: Physical Therapy

## 2021-02-26 DIAGNOSIS — M25551 Pain in right hip: Secondary | ICD-10-CM | POA: Diagnosis not present

## 2021-02-26 NOTE — Therapy (Signed)
[facility] [address] [place], [address] Phone: [phone]   Fax:  [phone]  Physical Therapy Treatment  Patient Details  Name: Hannah Yu MRN: [id number] Date of Birth: 12/26/1945 Referring Provider (PT): Richardean Canal, [place]   Encounter Date: 02/26/2021   PT End of Session - 02/26/21 1550     Visit Number 6    Number of Visits 17    Date for PT Re-Evaluation 03/21/21    Authorization Type medicare    Authorization - Visit Number 6    Authorization - Number of Visits 10    PT Start Time 1552    PT Stop Time 1630    PT Time Calculation (min) 38 min    Equipment Utilized During Treatment Gait belt    Activity Tolerance Patient tolerated treatment well    Behavior During Therapy [facility] for tasks assessed/performed             Past Medical History:  Diagnosis Date   Acid reflux    Anesthesia complication    Tachycardia previously, now on metoprolol   Arthritis    09/02/2019: per patient "have it real bad in both hands and back"   Hypertension    Sciatica    09/02/2019: has had it for about 3 years    Past Surgical History:  Procedure Laterality Date   ABDOMINAL HYSTERECTOMY     CATARACT EXTRACTION W/ INTRAOCULAR LENS IMPLANT Bilateral 2007   eyes done within 1 month of each other.   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   TOTAL HIP ARTHROPLASTY Right 09/06/2019   Procedure: RIGHT TOTAL HIP ARTHROPLASTY ANTERIOR APPROACH;  Surgeon: Kathryne Hitch, MD;  Location: [facility] OR;  Service: Orthopedics;  Laterality: Right;   TOTAL HIP ARTHROPLASTY Left 07/31/2020   Procedure: LEFT TOTAL HIP ARTHROPLASTY ANTERIOR APPROACH;  Surgeon: Kathryne Hitch, MD;  Location: [facility] OR;  Service: Orthopedics;  Laterality: Left;    There were no vitals filed for this visit.   Subjective Assessment - 02/26/21 1554     Subjective  Pt denies any pain hip pain this visit. She reports having some soreness following last visit. Patient reports feeling approx 70% improvement.    Pertinent History Patient reports to PT with bilateral hip pain L>R. She has pain right at this area (greater trochanter) that comes and goes with rest. Reports pain is present with walking, standing, stairs, and lifting. No pain at rest, but can wake her up if she rolls onto her L side, and has trouble getting back to sleep. She has 0/10 pain at rest and 8/10 with aggravating motions. Has tried ibuprofen and heat to help with her pain. Limited in activities such as shopping, unable to play her hobby of cornhole, and perform household chores. She has a PMH of bilateral THA L 2022, R 2021, and recent episode of R shouder pain with mass. Pt denies any unexplained weight fluctuation, saddle paresthesia, loss of bowel/bladder function, or unrelenting night pain at this time.    Limitations Other (comment);Walking;Standing;House hold activities    How long can you sit comfortably? unlimited    How long can you stand comfortably? 5 min    How long can you walk comfortably? immediately    Diagnostic tests MRI R shoulder unremarkable, did demonstrate swelling, [place] as well    Patient Stated Goals walking and laying on L side  Therex    Nu Step L2 x 5 min gentle LE strengthening   Mini Squat with swiss ball on wall with YTB above knee 2 x 10 reps; yellow theraband as cue to prevent genu valgus   Stairs x 3 trial with reciprocal gait pattern with single UE without pain; independently   Single Leg balance >20 sec on R LE no pain; <10 sec on LLE no pain   FWD Lunges onto large dynadisc 2 x 8-10 reps with single UE support   Seated ADDuction squeeze with ball 3 x 25 sec    Seated Hip Abduction GTB 3 x 10 reps   Seated marches GTB 2 x 10 reps     Piriformis  (ER) Stretch 2 x 30 sec B LE                           PT  Education - 02/26/21 1554     Education provided Yes    Education Details therex form/technique    Person(s) Educated Patient    Methods Explanation;Demonstration    Comprehension Verbalized understanding;Returned demonstration              PT Short Term Goals - 01/24/21 1543       PT SHORT TERM GOAL #1   Title Pt will be independent with HEP in order to improve strength and decrease pain in order to improve pain-free function at home and community activity.    Baseline 01/24/21: HEP given    Time 4    Period Weeks    Status New               PT Long Term Goals - 02/26/21 1600       PT LONG TERM GOAL #1   Title Patient will increase FOTO score to 62 to demonstrate predicted increase in functional mobility to complete ADLs    Baseline 01/24/21: 47    Time 8    Period Weeks    Status On-going      PT LONG TERM GOAL #2   Title Pt will negotiate 4, 6" stairs with a reciprocal gait pattern, single UE on railing, and without pain to demonstrate increase in functional LE strength and decrease likelihood of falling.    Baseline 01/24/21: step to pattern and painful 02/26/21: reciprocal gait pattern with single UE without pain    Time 8    Period Weeks    Status Achieved      PT LONG TERM GOAL #3   Title Pt will improve single leg stance to 20 sec or greater in order to demonstrate increased hip stability, increased balance, and decrease likelihood of falling.    Baseline 01/24/21: < 5 sec bilaterally 02/26/21: >20 sec on R LE no pain;  <10 sec on LLE no pain    Time 8    Period Weeks    Status On-going      PT LONG TERM GOAL #4   Title Patient will demonstrate gross bilat hip strength of 4+/5 in order to complete heavy household ADLs    Baseline 01/24/21: R LE grossly 4/5 L LE grossly 4/5 except Flex,ER,ABD 3/5 painful    Time 8    Period Weeks    Status On-going                   Plan - 02/26/21 1632     Clinical Impression Statement PT continued hip  stability and  LE strengthening this session with success. Pt tolerated session well demonstrating reciprocal gait pattern with stairs without pain, increased single leg stance bilaterally, and increased activity tolerance with resistance. Patient will continue to benefit from skilled therapy to address impairments and achieve remaining goals. Continue PT POC.    Personal Factors and Comorbidities Age;Comorbidity 3+;Fitness    Comorbidities HTN, arthritis, sciatica    Examination-Activity Limitations Locomotion Level;Stairs;Lift;Transfers;Stand    Examination-Participation Restrictions Psychologist, forensic;Shop    Stability/Clinical Decision Making Evolving/Moderate complexity    Clinical Decision Making Moderate    Rehab Potential Good    PT Frequency 2x / week    PT Duration 8 weeks    PT Treatment/Interventions Therapeutic activities;Therapeutic exercise;Balance training;Neuromuscular re-education;Patient/family education;Manual techniques;Dry needling;Aquatic Therapy;Electrical Stimulation;Iontophoresis 4mg /ml Dexamethasone;Gait training;Stair training;Passive range of motion;Joint Manipulations;Spinal Manipulations;Functional mobility training;ADLs/Self Care Home Management;Cryotherapy;Traction;Moist Heat;Ultrasound    PT Next Visit Plan glute med strengthening, ROM    Consulted and Agree with Plan of Care Patient             Patient will benefit from skilled therapeutic intervention in order to improve the following deficits and impairments:  Abnormal gait, Decreased balance, Decreased endurance, Decreased mobility, Difficulty walking, Hypomobility, Obesity, Improper body mechanics, Decreased range of motion, Decreased activity tolerance, Decreased strength, Increased fascial restricitons, Impaired flexibility, Postural dysfunction, Pain  Visit Diagnosis: Bilateral hip pain     Problem List Patient Active Problem List   Diagnosis Date Noted   Status post total replacement  of left hip 07/31/2020   Unilateral primary osteoarthritis, left hip 04/25/2020   Status post total replacement of right hip 09/06/2019   Pelvic pain in female 07/20/2019   Hyperlipidemia 07/20/2019   B12 deficiency 07/20/2019   Neuropathic pain of left hand 06/23/2017   Spinal stenosis of lumbosacral region 11/11/2016   Primary osteoarthritis of right hip 11/11/2016   Obesity (BMI 30.0-34.9) 09/12/2016   H/O supraventricular tachycardia 01/14/2016   Hypertension 12/24/2015   Back pain with right-sided sciatica 12/24/2015   Gallstone 10/19/2015   Biliary colic 10/19/2015   Diverticulitis 06/28/2014     06/30/2014 DPT Hilda Lias, SPT  Romilda Joy, PT 02/27/2021, 12:35 PM  [facility] [address], [place] Phone: [phone]   Fax:  [phone]  Name: Hannah Yu MRN: Evalina Field Date of Birth: Nov 12, 1945

## 2021-02-28 ENCOUNTER — Encounter: Payer: Self-pay | Admitting: Physical Therapy

## 2021-02-28 ENCOUNTER — Ambulatory Visit: Payer: Medicare Other | Admitting: Physical Therapy

## 2021-02-28 DIAGNOSIS — M25551 Pain in right hip: Secondary | ICD-10-CM

## 2021-02-28 DIAGNOSIS — M25552 Pain in left hip: Secondary | ICD-10-CM

## 2021-02-28 NOTE — Therapy (Signed)
[facility] [address], [place], [address] Phone: [phone]   Fax:  [phone]  Physical Therapy Treatment  Patient Details  Name: Hannah Yu MRN: [id number] Date of Birth: 1945/09/17 Referring Provider (PT): Richardean Canal, [place]   Encounter Date: 02/28/2021   PT End of Session - 02/28/21 1606     Visit Number 7    Number of Visits 17    Date for PT Re-Evaluation 03/21/21    Authorization Type medicare    Authorization Time Period of 10 progress report    Authorization - Visit Number 7    Authorization - Number of Visits 10    PT Start Time 1604    PT Stop Time 1642    PT Time Calculation (min) 38 min    Equipment Utilized During Treatment Gait belt    Activity Tolerance Patient tolerated treatment well    Behavior During Therapy [facility] for tasks assessed/performed             Past Medical History:  Diagnosis Date   Acid reflux    Anesthesia complication    Tachycardia previously, now on metoprolol   Arthritis    09/02/2019: per patient "have it real bad in both hands and back"   Hypertension    Sciatica    09/02/2019: has had it for about 3 years    Past Surgical History:  Procedure Laterality Date   ABDOMINAL HYSTERECTOMY     CATARACT EXTRACTION W/ INTRAOCULAR LENS IMPLANT Bilateral 2007   eyes done within 1 month of each other.   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   TOTAL HIP ARTHROPLASTY Right 09/06/2019   Procedure: RIGHT TOTAL HIP ARTHROPLASTY ANTERIOR APPROACH;  Surgeon: Kathryne Hitch, MD;  Location: [facility] OR;  Service: Orthopedics;  Laterality: Right;   TOTAL HIP ARTHROPLASTY Left 07/31/2020   Procedure: LEFT TOTAL HIP ARTHROPLASTY ANTERIOR APPROACH;  Surgeon: Kathryne Hitch, MD;  Location: [facility] OR;  Service: Orthopedics;  Laterality: Left;    There were no vitals filed for this visit.    Subjective Assessment - 02/28/21 1558     Subjective Pt denies any pain hip pain this visit. She reports having some soreness following last visit.    Pertinent History Patient reports to PT with bilateral hip pain L>R. She has pain right at this area (greater trochanter) that comes and goes with rest. Reports pain is present with walking, standing, stairs, and lifting. No pain at rest, but can wake her up if she rolls onto her L side, and has trouble getting back to sleep. She has 0/10 pain at rest and 8/10 with aggravating motions. Has tried ibuprofen and heat to help with her pain. Limited in activities such as shopping, unable to play her hobby of cornhole, and perform household chores. She has a PMH of bilateral THA L 2022, R 2021, and recent episode of R shouder pain with mass. Pt denies any unexplained weight fluctuation, saddle paresthesia, loss of bowel/bladder function, or unrelenting night pain at this time.    Limitations Other (comment);Walking;Standing;House hold activities    How long can you sit comfortably? unlimited    How long can you stand comfortably? 5 min    How long can you walk comfortably? immediately    Diagnostic tests MRI R shoulder unremarkable, did demonstrate swelling, [place] as well    Patient Stated Goals walking and laying on  L side                Therex    Nu Step L2 x 5 min gentle LE strengthening   Mini Squat onto swiss ball YTB  above knee 3 x 10 reps; yellow theraband as cue to prevent genu valgus   Standing Hip Abduction YTB 3 x 10 reps with cueing to decrease lateral hip sway   Eccentric Lateral Step up/downs 6 inch step 1 x 8 reps BLE with increased knee valgus and heel raising; 2 x 10 on 4 inch step with cueing to light touch heel to floor   FWD/BWD stepping on large dynadisc 1 x 10 reps on RLE; 1 x 4 reps on LLE with difficulty completing due to fatigue  Single Leg heel raises 2 x 10 reps with BUE support          PT Education - 02/28/21  1607     Education provided Yes    Education Details therex form/technique    Person(s) Educated Patient    Methods Explanation;Demonstration    Comprehension Verbalized understanding;Returned demonstration              PT Short Term Goals - 01/24/21 1543       PT SHORT TERM GOAL #1   Title Pt will be independent with HEP in order to improve strength and decrease pain in order to improve pain-free function at home and community activity.    Baseline 01/24/21: HEP given    Time 4    Period Weeks    Status New               PT Long Term Goals - 02/26/21 1600       PT LONG TERM GOAL #1   Title Patient will increase FOTO score to 62 to demonstrate predicted increase in functional mobility to complete ADLs    Baseline 01/24/21: 47    Time 8    Period Weeks    Status On-going      PT LONG TERM GOAL #2   Title Pt will negotiate 4, 6" stairs with a reciprocal gait pattern, single UE on railing, and without pain to demonstrate increase in functional LE strength and decrease likelihood of falling.    Baseline 01/24/21: step to pattern and painful 02/26/21: reciprocal gait pattern with single UE without pain    Time 8    Period Weeks    Status Achieved      PT LONG TERM GOAL #3   Title Pt will improve single leg stance to 20 sec or greater in order to demonstrate increased hip stability, increased balance, and decrease likelihood of falling.    Baseline 01/24/21: < 5 sec bilaterally 02/26/21: >20 sec on R LE no pain;  <10 sec on LLE no pain    Time 8    Period Weeks    Status On-going      PT LONG TERM GOAL #4   Title Patient will demonstrate gross bilat hip strength of 4+/5 in order to complete heavy household ADLs    Baseline 01/24/21: R LE grossly 4/5 L LE grossly 4/5 except Flex,ER,ABD 3/5 painful    Time 8    Period Weeks    Status On-going                   Plan - 02/28/21 1645     Clinical Impression Statement PT continued hip stability and LE  strengthening this session with success.  Pt tolerated session well without reports of hip pain. Pt continues to demonstrate LE weakness, decreased eccentric quadriceps control, and decreased activity tolerance. Patient will continue to benefit from skilled therapy to address impairments and achieve remaining goals. Continue PT POC.    Personal Factors and Comorbidities Age;Comorbidity 3+;Fitness    Comorbidities HTN, arthritis, sciatica    Examination-Activity Limitations Locomotion Level;Stairs;Lift;Transfers;Stand    Examination-Participation Restrictions Psychologist, forensic;Shop    Stability/Clinical Decision Making Evolving/Moderate complexity    Clinical Decision Making Moderate    Rehab Potential Good    PT Frequency 2x / week    PT Duration 8 weeks    PT Treatment/Interventions Therapeutic activities;Therapeutic exercise;Balance training;Neuromuscular re-education;Patient/family education;Manual techniques;Dry needling;Aquatic Therapy;Electrical Stimulation;Iontophoresis 4mg /ml Dexamethasone;Gait training;Stair training;Passive range of motion;Joint Manipulations;Spinal Manipulations;Functional mobility training;ADLs/Self Care Home Management;Cryotherapy;Traction;Moist Heat;Ultrasound    PT Next Visit Plan glute med strengthening, ROM    PT Home Exercise Plan SLS,Standing hip ABD, piriformis stretch    Consulted and Agree with Plan of Care Patient             Patient will benefit from skilled therapeutic intervention in order to improve the following deficits and impairments:  Abnormal gait, Decreased balance, Decreased endurance, Decreased mobility, Difficulty walking, Hypomobility, Obesity, Improper body mechanics, Decreased range of motion, Decreased activity tolerance, Decreased strength, Increased fascial restricitons, Impaired flexibility, Postural dysfunction, Pain  Visit Diagnosis: Bilateral hip pain     Problem List Patient Active Problem List   Diagnosis Date  Noted   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   Unilateral primary osteoarthritis, left hip 04/25/2020   Status post total replacement of right hip 09/06/2019   Pelvic pain in female 07/20/2019   Hyperlipidemia 07/20/2019   B12 deficiency 07/20/2019   Neuropathic pain of left hand 06/23/2017   Spinal stenosis of lumbosacral region 11/11/2016   Primary osteoarthritis of right hip 11/11/2016   Obesity (BMI 30.0-34.9) 09/12/2016   H/O supraventricular tachycardia 01/14/2016   Hypertension 12/24/2015   Back pain with right-sided sciatica 12/24/2015   Gallstone 10/19/2015   Biliary colic 10/19/2015   Diverticulitis 06/28/2014    06/30/2014 DPT Hilda Lias, SPT  Romilda Joy, PT 03/01/2021, 9:11 AM  [place] [address], [address], [place] Phone: [phone]   Fax:  [phone]  Name: Hannah Yu MRN: Evalina Field Date of Birth: 08-07-1945

## 2021-03-05 ENCOUNTER — Ambulatory Visit: Payer: Medicare Other | Admitting: Physical Therapy

## 2021-03-07 ENCOUNTER — Ambulatory Visit: Payer: Medicare Other | Attending: Physician Assistant | Admitting: Physical Therapy

## 2021-03-07 ENCOUNTER — Encounter: Payer: Self-pay | Admitting: Physical Therapy

## 2021-03-07 DIAGNOSIS — M25551 Pain in right hip: Secondary | ICD-10-CM | POA: Insufficient documentation

## 2021-03-07 DIAGNOSIS — M25552 Pain in left hip: Secondary | ICD-10-CM | POA: Diagnosis present

## 2021-03-07 NOTE — Therapy (Signed)
[facility] [address], [place], [address] Phone: [phone]   Fax:  [phone]  Physical Therapy Treatment  Patient Details  Name: Hannah Yu MRN: [id number] Date of Birth: [birth year] Referring Provider (PT): Richardean Canal, [place]   Encounter Date: 03/07/2021   PT End of Session - 03/07/21 1641     Visit Number 8    Number of Visits 17    Date for PT Re-Evaluation 03/21/21    Authorization Type medicare    Authorization Time Period of 10 progress report    Authorization - Visit Number 8    Authorization - Number of Visits 10    PT Start Time 1559    PT Stop Time 1637    PT Time Calculation (min) 38 min    Activity Tolerance Patient tolerated treatment well    Behavior During Therapy [facility] for tasks assessed/performed             Past Medical History:  Diagnosis Date   Acid reflux    Anesthesia complication    Tachycardia previously, now on metoprolol   Arthritis    09/02/2019: per patient "have it real bad in both hands and back"   Hypertension    Sciatica    09/02/2019: has had it for about 3 years    Past Surgical History:  Procedure Laterality Date   ABDOMINAL HYSTERECTOMY     CATARACT EXTRACTION W/ INTRAOCULAR LENS IMPLANT Bilateral 2007   eyes done within 1 month of each other.   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   TOTAL HIP ARTHROPLASTY Right 09/06/2019   Procedure: RIGHT TOTAL HIP ARTHROPLASTY ANTERIOR APPROACH;  Surgeon: Kathryne Hitch, MD;  Location: [facility] OR;  Service: Orthopedics;  Laterality: Right;   TOTAL HIP ARTHROPLASTY Left 07/31/2020   Procedure: LEFT TOTAL HIP ARTHROPLASTY ANTERIOR APPROACH;  Surgeon: Kathryne Hitch, MD;  Location: [facility] OR;  Service: Orthopedics;  Laterality: Left;    There were no vitals filed for this visit.   Subjective Assessment - 03/07/21 1601      Subjective Pt denies any pain hip pain this visit. She does report having some soreness for approx 24 hours following last visit.    Pertinent History Patient reports to PT with bilateral hip pain L>R. She has pain right at this area (greater trochanter) that comes and goes with rest. Reports pain is present with walking, standing, stairs, and lifting. No pain at rest, but can wake her up if she rolls onto her L side, and has trouble getting back to sleep. She has 0/10 pain at rest and 8/10 with aggravating motions. Has tried ibuprofen and heat to help with her pain. Limited in activities such as shopping, unable to play her hobby of cornhole, and perform household chores. She has a PMH of bilateral THA L 2022, R 2021, and recent episode of R shouder pain with mass. Pt denies any unexplained weight fluctuation, saddle paresthesia, loss of bowel/bladder function, or unrelenting night pain at this time.    Limitations Other (comment);Walking;Standing;House hold activities    How long can you sit comfortably? unlimited    How long can you stand comfortably? 5 min    How long can you walk comfortably? immediately    Diagnostic tests MRI R shoulder unremarkable, did demonstrate swelling, [place] as well    Patient Stated Goals walking and laying on L side  Therex    Nu Step L2 x 5 min gentle LE strengthening  Lateral step over 6" step L<>R 2 x 6 reps with cuing to maintain squat position    Mini Squat on dynadisc 2 x 6 reps cueing for foot alignment and UE support as needed   Glute bridge on bosu ball 3 x 8 reps with cues for maximal gluteal contraction   Single Leg heel raises 1 x 10; 1 x 5 reps with BUE support                            PT Education - 03/07/21 1602     Education provided Yes    Education Details therex form/technique    Person(s) Educated Patient    Methods Explanation;Demonstration    Comprehension Verbalized understanding;Returned  demonstration              PT Short Term Goals - 01/24/21 1543       PT SHORT TERM GOAL #1   Title Pt will be independent with HEP in order to improve strength and decrease pain in order to improve pain-free function at home and community activity.    Baseline 01/24/21: HEP given    Time 4    Period Weeks    Status New               PT Long Term Goals - 02/26/21 1600       PT LONG TERM GOAL #1   Title Patient will increase FOTO score to 62 to demonstrate predicted increase in functional mobility to complete ADLs    Baseline 01/24/21: 47    Time 8    Period Weeks    Status On-going      PT LONG TERM GOAL #2   Title Pt will negotiate 4, 6" stairs with a reciprocal gait pattern, single UE on railing, and without pain to demonstrate increase in functional LE strength and decrease likelihood of falling.    Baseline 01/24/21: step to pattern and painful 02/26/21: reciprocal gait pattern with single UE without pain    Time 8    Period Weeks    Status Achieved      PT LONG TERM GOAL #3   Title Pt will improve single leg stance to 20 sec or greater in order to demonstrate increased hip stability, increased balance, and decrease likelihood of falling.    Baseline 01/24/21: < 5 sec bilaterally 02/26/21: >20 sec on R LE no pain;  <10 sec on LLE no pain    Time 8    Period Weeks    Status On-going      PT LONG TERM GOAL #4   Title Patient will demonstrate gross bilat hip strength of 4+/5 in order to complete heavy household ADLs    Baseline 01/24/21: R LE grossly 4/5 L LE grossly 4/5 except Flex,ER,ABD 3/5 painful    Time 8    Period Weeks    Status On-going                   Plan - 03/07/21 1642     Clinical Impression Statement PT continued hip stability and LE strengthening this session with success. Pt tolerated session well without reports of hip pain. Pt is progressing with LE strength, balance, and activity tolerance. Patient will continue to benefit from  skilled therapy to address impairments and achieve remaining goals. Plan to discharge next visit.  Personal Factors and Comorbidities Age;Comorbidity 3+;Fitness    Comorbidities HTN, arthritis, sciatica    Examination-Activity Limitations Locomotion Level;Stairs;Lift;Transfers;Stand    Examination-Participation Restrictions Psychologist, forensic;Shop    Clinical Decision Making Moderate    Rehab Potential Good    PT Frequency 2x / week    PT Duration 8 weeks    PT Treatment/Interventions Therapeutic activities;Therapeutic exercise;Balance training;Neuromuscular re-education;Patient/family education;Manual techniques;Dry needling;Aquatic Therapy;Electrical Stimulation;Iontophoresis 4mg /ml Dexamethasone;Gait training;Stair training;Passive range of motion;Joint Manipulations;Spinal Manipulations;Functional mobility training;ADLs/Self Care Home Management;Cryotherapy;Traction;Moist Heat;Ultrasound    PT Next Visit Plan glute med strengthening, ROM    PT Home Exercise Plan SLS,Standing hip ABD, piriformis stretch    Consulted and Agree with Plan of Care Patient             Patient will benefit from skilled therapeutic intervention in order to improve the following deficits and impairments:  Abnormal gait, Decreased balance, Decreased endurance, Decreased mobility, Difficulty walking, Hypomobility, Obesity, Improper body mechanics, Decreased range of motion, Decreased activity tolerance, Decreased strength, Increased fascial restricitons, Impaired flexibility, Postural dysfunction, Pain  Visit Diagnosis: Bilateral hip pain     Problem List Patient Active Problem List   Diagnosis Date Noted   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   Unilateral primary osteoarthritis, left hip 04/25/2020   Status post total replacement of right hip 09/06/2019   Pelvic pain in female 07/20/2019   Hyperlipidemia 07/20/2019   B12 deficiency 07/20/2019   Neuropathic pain of left hand  06/23/2017   Spinal stenosis of lumbosacral region 11/11/2016   Primary osteoarthritis of right hip 11/11/2016   Obesity (BMI 30.0-34.9) 09/12/2016   H/O supraventricular tachycardia 01/14/2016   Hypertension 12/24/2015   Back pain with right-sided sciatica 12/24/2015   Gallstone 10/19/2015   Biliary colic 10/19/2015   Diverticulitis 06/28/2014    06/30/2014 DPT Hilda Lias, SPT  Romilda Joy, PT 03/08/2021, 10:01 AM  [place] [address], [place] Phone: [phone]   Fax:  [phone]  Name: Hannah Yu MRN: Evalina Field Date of Birth: 03-17-1946

## 2021-03-12 ENCOUNTER — Ambulatory Visit: Payer: Medicare Other | Admitting: Physical Therapy

## 2021-03-12 ENCOUNTER — Encounter: Payer: Self-pay | Admitting: Physical Therapy

## 2021-03-12 DIAGNOSIS — M25552 Pain in left hip: Secondary | ICD-10-CM

## 2021-03-12 DIAGNOSIS — M25551 Pain in right hip: Secondary | ICD-10-CM

## 2021-03-12 NOTE — Therapy (Signed)
[facility] [address] [place], [address] Phone: [phone]   Fax:  [phone]  Physical Therapy Treatment Discharge Summary Reporting Period 01/24/21-03/12/21  Patient Details  Name: Hannah Yu MRN: [id number] Date of Birth: April 14, 1946 Referring Provider (PT): Erskine Emery, [place]   Encounter Date: 03/12/2021   PT End of Session - 03/12/21 1623     Visit Number 9    Number of Visits 17    Date for PT Re-Evaluation 03/21/21    Authorization Type medicare    Authorization Time Period of 10 progress report    Authorization - Visit Number 9    Authorization - Number of Visits 10    PT Start Time 1400    PT Stop Time 1423    PT Time Calculation (min) 23 min    Activity Tolerance Patient tolerated treatment well    Behavior During Therapy [facility] for tasks assessed/performed             Past Medical History:  Diagnosis Date   Acid reflux    Anesthesia complication    Tachycardia previously, now on metoprolol   Arthritis    09/02/2019: per patient "have it real bad in both hands and back"   Hypertension    Sciatica    09/02/2019: has had it for about 3 years    Past Surgical History:  Procedure Laterality Date   ABDOMINAL HYSTERECTOMY     CATARACT EXTRACTION W/ INTRAOCULAR LENS IMPLANT Bilateral 2007   eyes done within 1 month of each other.   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   TOTAL HIP ARTHROPLASTY Right 09/06/2019   Procedure: RIGHT TOTAL HIP ARTHROPLASTY ANTERIOR APPROACH;  Surgeon: Mcarthur Rossetti, MD;  Location: [facility] [place];  Service: Orthopedics;  Laterality: Right;   TOTAL HIP ARTHROPLASTY Left 07/31/2020   Procedure: LEFT TOTAL HIP ARTHROPLASTY ANTERIOR APPROACH;  Surgeon: Mcarthur Rossetti, MD;  Location: [place];  Service: Orthopedics;  Laterality: Left;    There were no vitals filed for this visit.    Subjective Assessment - 03/12/21 1602     Subjective Pt reports increased soreness from "over-doing it" traversing multiple flights of stairs over the weekend. She reports otherwise feeling fine    Pertinent History Patient reports to PT with bilateral hip pain L>R. She has pain right at this area (greater trochanter) that comes and goes with rest. Reports pain is present with walking, standing, stairs, and lifting. No pain at rest, but can wake her up if she rolls onto her L side, and has trouble getting back to sleep. She has 0/10 pain at rest and 8/10 with aggravating motions. Has tried ibuprofen and heat to help with her pain. Limited in activities such as shopping, unable to play her hobby of cornhole, and perform household chores. She has a PMH of bilateral THA L 2022, R 2021, and recent episode of R shouder pain with mass. Pt denies any unexplained weight fluctuation, saddle paresthesia, loss of bowel/bladder function, or unrelenting night pain at this time.    Limitations Other (comment);Walking;Standing;House hold activities    How long can you sit comfortably? unlimited    How long can you stand comfortably? 5 min    How long can you walk comfortably? immediately    Diagnostic tests MRI R shoulder unremarkable, did demonstrate swelling, [place] as well    Patient Stated Goals walking and laying on  L side            Therex  Nu Step L2 x 5 min   SLS <10 sec bilat LE  MMT Hip Strength R/L  Flex 5/4+ Ext 4+/4+ ER 4+/4+ ABD 4+/4 ADD 4+/4+  PT reviewed the following HEP with patient with patient able to demonstrate a set of the following with min cuing for correction needed. PT educated patient on parameters of therex (how/when to inc/decrease intensity, frequency, rep/set range, stretch hold time, and purpose of therex) with verbalized understanding.   Standing Hip ABDuction 3 x 8-10 2-3/week  Sit to stands 3 x 8-10 reps 2-3x/week   Piriformis Stretch 3 x 30 sec 7/week   Single  Leg Standing with single UE support 15-20 sec 2-3/week  Walking/elliptical gradually increase time starting 5-10 min   * Patient educated progress to date, HEP, and gradual progression of exercise and of its importance post PT.                             PT Education - 03/13/21 1035     Education provided Yes    Education Details therex form/technique    Person(s) Educated Patient    Methods Explanation;Demonstration;Verbal cues    Comprehension Verbalized understanding;Verbal cues required;Returned demonstration              PT Short Term Goals - 03/12/21 1606       PT SHORT TERM GOAL #1   Title Pt will be independent with HEP in order to improve strength and decrease pain in order to improve pain-free function at home and community activity.    Baseline 01/24/21: HEP given    Time 4    Period Weeks    Status Achieved               PT Long Term Goals - 03/12/21 1607       PT LONG TERM GOAL #1   Title Patient will increase FOTO score to 62 to demonstrate predicted increase in functional mobility to complete ADLs    Baseline 01/24/21: 47 03/12/21:    Time 8    Period Weeks    Target Date 03/21/21      PT LONG TERM GOAL #2   Title Pt will negotiate 4, 6" stairs with a reciprocal gait pattern, single UE on railing, and without pain to demonstrate increase in functional LE strength and decrease likelihood of falling.    Baseline 01/24/21: step to pattern and painful 02/26/21: reciprocal gait pattern with single UE without pain    Time 8    Period Weeks    Status Achieved    Target Date 03/21/21      PT LONG TERM GOAL #3   Title Pt will improve single leg stance to 20 sec or greater in order to demonstrate increased hip stability, increased balance, and decrease likelihood of falling.    Baseline 01/24/21: < 5 sec bilaterally 02/26/21: >20 sec on R LE no pain;  <10 sec on LLE no pain 03/12/21: < 10 sec bilat without pain    Time 8    Period  Weeks    Status Not Met    Target Date 03/21/21      PT LONG TERM GOAL #4   Title Patient will demonstrate gross bilat hip strength of 4+/5 in order to complete heavy household ADLs    Baseline 01/24/21: R LE grossly 4/5 L LE grossly  4/5 except Flex,ER,ABD 3/5 painful 03/12/21: grossly 4+/5 bilaterally without pain    Time 8    Period Weeks    Status Achieved    Target Date 03/21/21                   Plan - 03/12/21 1639     Clinical Impression Statement Patient is aware of discharge and prognosis. Patient verbalized understanding of discharge recommendations, achieved progress, and importance of continuation of exercise post PT.Overall, patient has improved bilat hip strength, decreased bilat hip pain,  increased functional mobility, and is indepenendent with HEP. An HEP was provided and  reviewed. The patient was able to demonstrate and verbalize understanding of exercises and prescription. Patient has achieved all set goals except single leg balance. End PT POC this episode.    Personal Factors and Comorbidities Age;Comorbidity 3+;Fitness    Comorbidities HTN, arthritis, sciatica    Examination-Activity Limitations Locomotion Level;Stairs;Lift;Transfers;Stand    Examination-Participation Restrictions Art gallery manager;Shop    Stability/Clinical Decision Making Evolving/Moderate complexity    Clinical Decision Making Moderate    Rehab Potential Good    PT Frequency 2x / week    PT Duration 8 weeks    PT Treatment/Interventions Therapeutic activities;Therapeutic exercise;Balance training;Neuromuscular re-education;Patient/family education;Manual techniques;Dry needling;Aquatic Therapy;Electrical Stimulation;Iontophoresis 88m/ml Dexamethasone;Gait training;Stair training;Passive range of motion;Joint Manipulations;Spinal Manipulations;Functional mobility training;ADLs/Self Care Home Management;Cryotherapy;Traction;Moist Heat;Ultrasound    PT Next Visit Plan dc'd    PT  Home Exercise Plan SLS,Standing hip ABD, piriformis stretch             Patient will benefit from skilled therapeutic intervention in order to improve the following deficits and impairments:  Abnormal gait, Decreased balance, Decreased endurance, Decreased mobility, Difficulty walking, Hypomobility, Obesity, Improper body mechanics, Decreased range of motion, Decreased activity tolerance, Decreased strength, Increased fascial restricitons, Impaired flexibility, Postural dysfunction, Pain  Visit Diagnosis: Bilateral hip pain     Problem List Patient Active Problem List   Diagnosis Date Noted   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   Unilateral primary osteoarthritis, left hip 04/25/2020   Status post total replacement of right hip 09/06/2019   Pelvic pain in female 07/20/2019   Hyperlipidemia 07/20/2019   B12 deficiency 07/20/2019   Neuropathic pain of left hand 06/23/2017   Spinal stenosis of lumbosacral region 11/11/2016   Primary osteoarthritis of right hip 11/11/2016   Obesity (BMI 30.0-34.9) 09/12/2016   H/O supraventricular tachycardia 01/14/2016   Hypertension 12/24/2015   Back pain with right-sided sciatica 12/24/2015   Gallstone 097/28/2060  Biliary colic 015/61/5379  Diverticulitis 06/28/2014     CDurwin RegesDPT ASharion Settler SPT  CDurwin Reges PT 03/13/2021, 10:37 AM  [facility] [address] [place] [address]Phone: [phone]  Fax:  [phone] Name: Hannah GARGUSMRN: [id number] of Birth: [birth year]

## 2021-03-13 ENCOUNTER — Other Ambulatory Visit: Payer: Self-pay

## 2021-03-13 ENCOUNTER — Encounter: Payer: Self-pay | Admitting: Orthopaedic Surgery

## 2021-03-13 ENCOUNTER — Ambulatory Visit (INDEPENDENT_AMBULATORY_CARE_PROVIDER_SITE_OTHER): Payer: Medicare Other | Admitting: Orthopaedic Surgery

## 2021-03-13 DIAGNOSIS — Z96641 Presence of right artificial hip joint: Secondary | ICD-10-CM

## 2021-03-13 DIAGNOSIS — Z96642 Presence of left artificial hip joint: Secondary | ICD-10-CM

## 2021-03-13 DIAGNOSIS — M7061 Trochanteric bursitis, right hip: Secondary | ICD-10-CM | POA: Diagnosis not present

## 2021-03-13 DIAGNOSIS — M7062 Trochanteric bursitis, left hip: Secondary | ICD-10-CM | POA: Diagnosis not present

## 2021-03-13 NOTE — Progress Notes (Signed)
The patient is a very active 75 year old female who we have replaced both of her hips.  She says they are now doing very well and she is doing well.  She has good range of motion and strength in both.  We actually x-rayed them just in August and they look good then.  She has been going to physical therapy due to bilateral hip trochanteric bursitis.  She says therapy released her yesterday.  She is walking without assistive device and overall looks good today.  She does not walk with a limp.  Both hips on exam move smoothly and fluidly with no pain in the groin.  There is minimal discomfort over the trochanteric area of both hips.  She will continue with a home exercise program.  All questions and concerns were answered and addressed.  She will also continue stretching.  If things worsen anyway she knows to come see [place].  She can follow-up as needed.

## 2021-03-14 ENCOUNTER — Ambulatory Visit: Payer: Medicare Other | Admitting: Physical Therapy

## 2021-03-19 ENCOUNTER — Encounter: Payer: Medicare Other | Admitting: Physical Therapy

## 2021-03-21 ENCOUNTER — Encounter: Payer: Medicare Other | Admitting: Physical Therapy

## 2021-03-25 ENCOUNTER — Encounter: Payer: Medicare Other | Admitting: Physical Therapy

## 2021-04-01 ENCOUNTER — Encounter: Payer: Medicare Other | Admitting: Physical Therapy

## 2021-04-03 ENCOUNTER — Encounter: Payer: Medicare Other | Admitting: Physical Therapy

## 2021-08-31 IMAGING — NM NM PULMONARY VENT & PERF
16 series · 16 of 16 positions shown · non-contrast
Comparison: Chest x-ray dated September 08, 19

CLINICAL DATA: Oxygen desaturation.

EXAM:
NUCLEAR MEDICINE VENTILATION - PERFUSION LUNG SCAN
TECHNIQUE: Ventilation images were obtained in multiple projections using
inhaled aerosol Mc-OOm DTPA. Perfusion images were obtained in
multiple projections after intravenous injection of Mc-OOm MAA.
RADIOPHARMACEUTICALS:  1.59 mCi of Mc-OOm DTPA aerosol inhalation
and 40.6 mCi Ac55m MAA IV

[Series 1: ant/post perf · 4.14mm/px · 1 of 1 slices shown (1 of 2)]
[im 1/1]
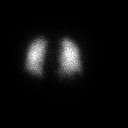

[Series 1: ant/post perf · 4.14mm/px · 1 of 1 slices shown (2 of 2)]
[im 1/1]
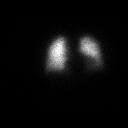

[Series 2: lao/rpo perf · 4.14mm/px · 1 of 1 slices shown (1 of 2)]
[im 1/1]
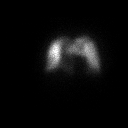

[Series 2: lao/rpo perf · 4.14mm/px · 1 of 1 slices shown (2 of 2)]
[im 1/1]
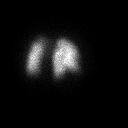

[Series 3: lpo/rao perf · 4.14mm/px · 1 of 1 slices shown (1 of 2)]
[im 1/1]
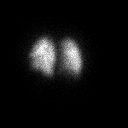

[Series 3: lpo/rao perf · 4.14mm/px · 1 of 1 slices shown (2 of 2)]
[im 1/1]
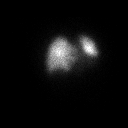

[Series 4: lt lat/rt lat perf · 4.14mm/px · 1 of 1 slices shown (1 of 2)]
[im 1/1]
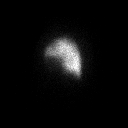

[Series 4: lt lat/rt lat perf · 4.14mm/px · 1 of 1 slices shown (2 of 2)]
[im 1/1]
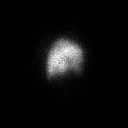

[Series 5: lt lat/rt lat vent · 4.14mm/px · 1 of 1 slices shown (1 of 2)]
[im 1/1]
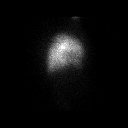

[Series 5: lt lat/rt lat vent · 4.14mm/px · 1 of 1 slices shown (2 of 2)]
[im 1/1]
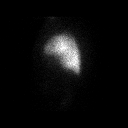

[Series 6: lpo/rao vent · 4.14mm/px · 1 of 1 slices shown (1 of 2)]
[im 1/1]
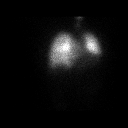

[Series 6: lpo/rao vent · 4.14mm/px · 1 of 1 slices shown (2 of 2)]
[im 1/1]
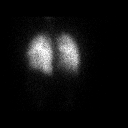

[Series 7: ant/post vent · 4.14mm/px · 1 of 1 slices shown (1 of 2)]
[im 1/1]
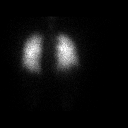

[Series 7: ant/post vent · 4.14mm/px · 1 of 1 slices shown (2 of 2)]
[im 1/1]
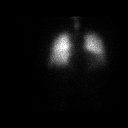

[Series 8: lao/rpo vent · 4.14mm/px · 1 of 1 slices shown (1 of 2)]
[im 1/1]
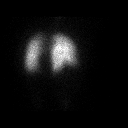

[Series 8: lao/rpo vent · 4.14mm/px · 1 of 1 slices shown (2 of 2)]
[im 1/1]
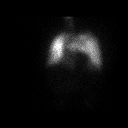

[16 of 16 positions shown; findings below may reference images not displayed]

FINDINGS: Ventilation: There is a defect in ventilation in the anterior left
upper lobe. There is some mild clumping of radiotracer in the
trachea and central airways.

Perfusion: There is a mass defect within the anterior left upper
lobe on the perfusion images. No mismatched defect identified on
this study.
IMPRESSION: 1. Low probability for pulmonary embolism.
2. Mild central distribution of radiotracer on the ventilation
portion of the exam may indicate some degree of underlying
obstructive airway disease.

## 2021-08-31 IMAGING — DX DG CHEST 1V PORT SAME DAY
1 series · 1 of 1 positions shown · non-contrast
Comparison: CT abdomen 11/09/2015

CLINICAL DATA: Oxygen desaturation.

EXAM:
PORTABLE CHEST 1 VIEW

[chest]
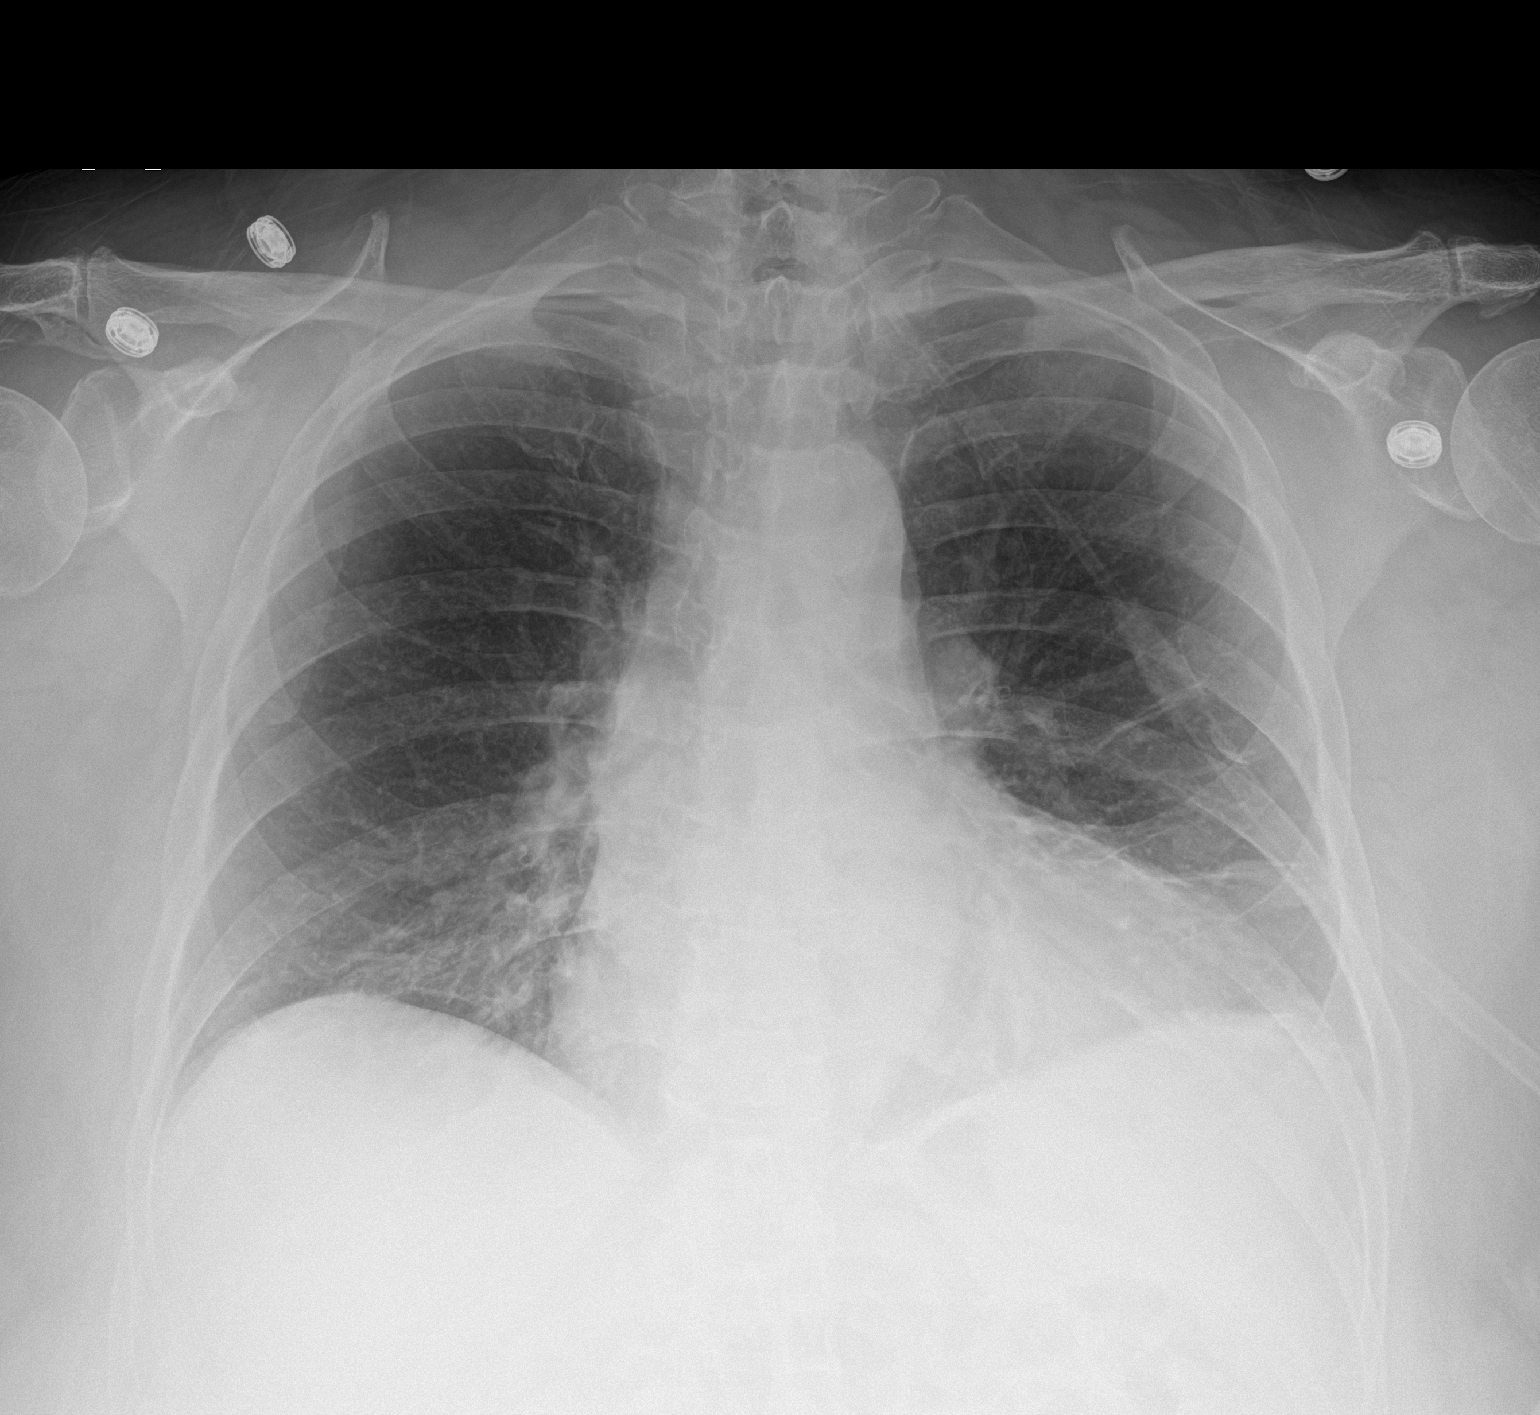

[1 of 1 positions shown; findings below may reference images not displayed]

FINDINGS: Linear densities in the mid and lower left chest. Few streaky
densities in the medial right lung base. Upper lungs are clear.
Negative for pneumothorax. Heart size is within normal limits. Bone
structures are unremarkable.
IMPRESSION: Streaky and linear densities in both lungs, left side greater than
right. Findings are most compatible with atelectasis and possibly
scarring.

## 2021-10-22 ENCOUNTER — Other Ambulatory Visit: Payer: Self-pay | Admitting: Infectious Diseases

## 2021-10-22 DIAGNOSIS — Z1231 Encounter for screening mammogram for malignant neoplasm of breast: Secondary | ICD-10-CM

## 2021-11-12 ENCOUNTER — Ambulatory Visit
Admission: RE | Admit: 2021-11-12 | Discharge: 2021-11-12 | Disposition: A | Discharge status: Home / Self Care | Payer: Medicare Other | Source: Ambulatory Visit | Attending: Infectious Diseases | Admitting: Infectious Diseases

## 2021-11-12 DIAGNOSIS — Z1231 Encounter for screening mammogram for malignant neoplasm of breast: Secondary | ICD-10-CM | POA: Diagnosis present

## 2022-08-26 ENCOUNTER — Other Ambulatory Visit
Admission: RE | Admit: 2022-08-26 | Discharge: 2022-08-26 | Disposition: A | Discharge status: Home / Self Care | Payer: Medicare Other | Source: Ambulatory Visit | Attending: Infectious Diseases | Admitting: Infectious Diseases

## 2022-08-26 DIAGNOSIS — R000 Tachycardia, unspecified: Secondary | ICD-10-CM | POA: Insufficient documentation

## 2022-08-26 DIAGNOSIS — R0902 Hypoxemia: Secondary | ICD-10-CM | POA: Diagnosis present

## 2022-08-26 LAB — D-DIMER, QUANTITATIVE: D-Dimer, Quant: 1.55 ug/mL-FEU — ABNORMAL HIGH (ref 0.00–0.50)

## 2022-08-27 ENCOUNTER — Ambulatory Visit
Admission: RE | Admit: 2022-08-27 | Discharge: 2022-08-27 | Disposition: A | Discharge status: Home / Self Care | Payer: Medicare Other | Source: Ambulatory Visit | Attending: Infectious Diseases | Admitting: Infectious Diseases

## 2022-08-27 ENCOUNTER — Other Ambulatory Visit: Payer: Self-pay | Admitting: Infectious Diseases

## 2022-08-27 DIAGNOSIS — R000 Tachycardia, unspecified: Secondary | ICD-10-CM

## 2022-08-27 DIAGNOSIS — R7989 Other specified abnormal findings of blood chemistry: Secondary | ICD-10-CM | POA: Diagnosis not present

## 2022-08-27 DIAGNOSIS — R0902 Hypoxemia: Secondary | ICD-10-CM

## 2022-08-27 MED ORDER — IOHEXOL 350 MG/ML SOLN
75.0000 mL | Freq: Once | INTRAVENOUS | Status: AC | PRN
  Administered 2022-08-27: 75 mL via INTRAVENOUS

## 2022-09-09 ENCOUNTER — Other Ambulatory Visit: Payer: Self-pay | Admitting: Pulmonary Disease

## 2022-09-09 DIAGNOSIS — R911 Solitary pulmonary nodule: Secondary | ICD-10-CM

## 2022-09-11 ENCOUNTER — Other Ambulatory Visit: Payer: Self-pay | Admitting: Pulmonary Disease

## 2022-09-11 DIAGNOSIS — R911 Solitary pulmonary nodule: Secondary | ICD-10-CM

## 2022-09-26 ENCOUNTER — Encounter
Admission: RE | Admit: 2022-09-26 | Discharge: 2022-09-26 | Disposition: A | Discharge status: Home / Self Care | Payer: Medicare Other | Source: Ambulatory Visit | Attending: Pulmonary Disease | Admitting: Pulmonary Disease

## 2022-09-26 ENCOUNTER — Other Ambulatory Visit: Payer: Self-pay

## 2022-09-26 HISTORY — DX: Anemia, unspecified: D64.9

## 2022-09-26 HISTORY — DX: Acute embolism and thrombosis of unspecified deep veins of unspecified lower extremity: I82.409

## 2022-09-26 NOTE — Patient Instructions (Addendum)
Your procedure is scheduled on: Monday 10/06/22 To find out your arrival time, please call [phone] between 1PM - 3PM on:   Friday 10/03/22 Report to the Registration Desk on the 1st floor of the [facility]. Valet parking is available.  If your arrival time is 6:00 am, do not arrive before that time as the [facility] entrance doors do not open until 6:00 am.  REMEMBER: Instructions that are not followed completely may result in serious medical risk, up to and including death; or upon the discretion of your surgeon and anesthesiologist your surgery may need to be rescheduled.  Do not eat food or drink any liquids after midnight the night before surgery.  No gum chewing or hard candies.  One week prior to surgery: Stop Anti-inflammatories (NSAIDS) such as Advil, Aleve, Ibuprofen, Motrin, Naproxen, Naprosyn and Aspirin based products such as Excedrin, Goody's Powder, BC Powder. You may however, continue to take Tylenol if needed for pain up until the day of surgery. You may use Tylenol PM or just Benedryl in place or your Ibuprofen PM  Stop ANY OVER THE COUNTER supplements until after surgery.  Continue taking all prescribed medications.   TAKE ONLY THESE MEDICATIONS THE MORNING OF SURGERY WITH A SIP OF WATER:  amLODipine (NORVASC)  metoprolol succinate (TOPROL-XL)  omeprazole (PRILOSEC)  Antacid (take one the night before and one on the morning of surgery - helps to prevent nausea after surgery.)  No Alcohol for 24 hours before or after surgery.  No Smoking including e-cigarettes for 24 hours before surgery.  No chewable tobacco products for at least 6 hours before surgery.  No nicotine patches on the day of surgery.  Do not use any "recreational" drugs for at least a week (preferably 2 weeks) before your surgery.  Please be advised that the combination of cocaine and anesthesia may have negative outcomes, up to and including death. If you test positive for cocaine, your  surgery will be cancelled.  On the morning of surgery brush your teeth with toothpaste and water, you may rinse your mouth with mouthwash if you wish. Do not swallow any toothpaste or mouthwash.  Use CHG Soap or wipes as directed on instruction sheet. Shower with your regular soap the morning of this procedure.  Do not wear lotions, powders, or perfumes.   Do not shave body hair from the neck down 48 hours before surgery.  Wear comfortable clothing (specific to your surgery type) to the hospital.  Do not wear jewelry, make-up, hairpins, clips or nail polish.  Contact lenses, hearing aids and dentures may not be worn into surgery.  Do not bring valuables to the hospital. [facility] is not responsible for any missing/lost belongings or valuables.   Notify your doctor if there is any change in your medical condition (cold, fever, infection).  If you are being discharged the day of surgery, you will not be allowed to drive home. You will need a responsible individual to drive you home and stay with you for 24 hours after surgery.   If you are taking public transportation, you will need to have a responsible individual with you.  If you are being admitted to the hospital overnight, leave your suitcase in the car. After surgery it may be brought to your room.  In case of increased patient census, it may be necessary for you, the patient, to continue your postoperative care in the Same Day Surgery department.  After surgery, you can help prevent lung complications by  doing breathing exercises.  Take deep breaths and cough every 1-2 hours. Your doctor may order a device called an Incentive Spirometer to help you take deep breaths. When coughing or sneezing, hold a pillow firmly against your incision with both hands. This is called "splinting." Doing this helps protect your incision. It also decreases belly discomfort.  Surgery Visitation Policy:  Patients undergoing a surgery or  procedure may have two family members or support persons with them as long as the person is not COVID-19 positive or experiencing its symptoms.   Inpatient Visitation:    Visiting hours are 7 a.m. to 8 p.m. Up to four visitors are allowed at one time in a patient room. The visitors may rotate out with other people during the day. One designated support person (adult) may remain overnight.  Please call the Pre-admissions Testing Dept. at [phone] if you have any questions about these instructions.  Dr Karna Christmas was not in office Friday 09/26/22. I left his nurse a message that we no longer required Covid testing unless the Dr orders it. I will reach out to them again on Tuesday 09/30/22 and then contact you with the information.

## 2022-09-30 ENCOUNTER — Ambulatory Visit
Admission: RE | Admit: 2022-09-30 | Discharge: 2022-09-30 | Disposition: A | Discharge status: Home / Self Care | Payer: Medicare Other | Source: Ambulatory Visit | Attending: Pulmonary Disease | Admitting: Pulmonary Disease

## 2022-09-30 DIAGNOSIS — K8020 Calculus of gallbladder without cholecystitis without obstruction: Secondary | ICD-10-CM | POA: Diagnosis not present

## 2022-09-30 DIAGNOSIS — K5730 Diverticulosis of large intestine without perforation or abscess without bleeding: Secondary | ICD-10-CM | POA: Insufficient documentation

## 2022-09-30 DIAGNOSIS — R911 Solitary pulmonary nodule: Secondary | ICD-10-CM | POA: Diagnosis present

## 2022-09-30 DIAGNOSIS — R918 Other nonspecific abnormal finding of lung field: Secondary | ICD-10-CM | POA: Insufficient documentation

## 2022-09-30 DIAGNOSIS — Z96643 Presence of artificial hip joint, bilateral: Secondary | ICD-10-CM | POA: Diagnosis not present

## 2022-09-30 DIAGNOSIS — R0602 Shortness of breath: Secondary | ICD-10-CM | POA: Diagnosis not present

## 2022-09-30 DIAGNOSIS — I700 Atherosclerosis of aorta: Secondary | ICD-10-CM | POA: Insufficient documentation

## 2022-09-30 LAB — GLUCOSE, CAPILLARY: Glucose-Capillary: 116 mg/dL — ABNORMAL HIGH (ref 70–99)

## 2022-09-30 MED ORDER — FLUDEOXYGLUCOSE F - 18 (FDG) INJECTION
9.0000 | Freq: Once | INTRAVENOUS | Status: AC | PRN
  Administered 2022-09-30: 10.21 via INTRAVENOUS

## 2022-10-01 ENCOUNTER — Encounter: Payer: Self-pay | Admitting: Urgent Care

## 2022-10-01 ENCOUNTER — Encounter
Admission: RE | Admit: 2022-10-01 | Discharge: 2022-10-01 | Disposition: A | Discharge status: Home / Self Care | Payer: Medicare Other | Source: Ambulatory Visit | Attending: Pulmonary Disease | Admitting: Pulmonary Disease

## 2022-10-01 DIAGNOSIS — Z8679 Personal history of other diseases of the circulatory system: Secondary | ICD-10-CM | POA: Insufficient documentation

## 2022-10-01 DIAGNOSIS — I10 Essential (primary) hypertension: Secondary | ICD-10-CM | POA: Diagnosis not present

## 2022-10-01 DIAGNOSIS — Z01818 Encounter for other preprocedural examination: Secondary | ICD-10-CM

## 2022-10-01 LAB — APTT: aPTT: 28 seconds (ref 24–36)

## 2022-10-01 LAB — PROTIME-INR
INR: 1 (ref 0.8–1.2)
Prothrombin Time: 13 seconds (ref 11.4–15.2)

## 2022-10-03 ENCOUNTER — Ambulatory Visit
Admission: RE | Admit: 2022-10-03 | Discharge: 2022-10-03 | Disposition: A | Discharge status: Home / Self Care | Payer: Medicare Other | Source: Ambulatory Visit | Attending: Infectious Diseases | Admitting: Infectious Diseases

## 2022-10-03 ENCOUNTER — Inpatient Hospital Stay: Admission: RE | Admit: 2022-10-03 | Payer: Medicare Other | Source: Ambulatory Visit

## 2022-10-03 DIAGNOSIS — R911 Solitary pulmonary nodule: Secondary | ICD-10-CM | POA: Insufficient documentation

## 2022-10-06 ENCOUNTER — Other Ambulatory Visit: Payer: Self-pay

## 2022-10-06 ENCOUNTER — Ambulatory Visit: Payer: Medicare Other

## 2022-10-06 ENCOUNTER — Ambulatory Visit: Payer: Medicare Other | Admitting: Anesthesiology

## 2022-10-06 ENCOUNTER — Ambulatory Visit
Admission: RE | Admit: 2022-10-06 | Discharge: 2022-10-06 | Disposition: A | Discharge status: Home / Self Care | Payer: Medicare Other | Source: Ambulatory Visit | Attending: Pulmonary Disease | Admitting: Pulmonary Disease

## 2022-10-06 ENCOUNTER — Encounter
Admission: RE | Disposition: A | Discharge status: Home / Self Care | Payer: Self-pay | Source: Ambulatory Visit | Attending: Pulmonary Disease

## 2022-10-06 DIAGNOSIS — Z8679 Personal history of other diseases of the circulatory system: Secondary | ICD-10-CM

## 2022-10-06 DIAGNOSIS — Z87891 Personal history of nicotine dependence: Secondary | ICD-10-CM | POA: Diagnosis not present

## 2022-10-06 DIAGNOSIS — R918 Other nonspecific abnormal finding of lung field: Secondary | ICD-10-CM | POA: Diagnosis present

## 2022-10-06 DIAGNOSIS — R590 Localized enlarged lymph nodes: Secondary | ICD-10-CM | POA: Insufficient documentation

## 2022-10-06 DIAGNOSIS — I10 Essential (primary) hypertension: Secondary | ICD-10-CM

## 2022-10-06 DIAGNOSIS — C3432 Malignant neoplasm of lower lobe, left bronchus or lung: Secondary | ICD-10-CM | POA: Insufficient documentation

## 2022-10-06 DIAGNOSIS — K5730 Diverticulosis of large intestine without perforation or abscess without bleeding: Secondary | ICD-10-CM | POA: Insufficient documentation

## 2022-10-06 DIAGNOSIS — Z01818 Encounter for other preprocedural examination: Secondary | ICD-10-CM

## 2022-10-06 HISTORY — PX: VIDEO BRONCHOSCOPY WITH ENDOBRONCHIAL ULTRASOUND: SHX6177

## 2022-10-06 SURGERY — BRONCHOSCOPY, WITH EBUS
Anesthesia: General

## 2022-10-06 MED ORDER — CHLORHEXIDINE GLUCONATE 0.12 % MT SOLN
15.0000 mL | Freq: Once | OROMUCOSAL | Status: AC
  Administered 2022-10-06: 15 mL via OROMUCOSAL

## 2022-10-06 MED ORDER — MIDAZOLAM HCL 2 MG/2ML IJ SOLN
INTRAMUSCULAR | Status: AC
  Filled 2022-10-06: qty 2

## 2022-10-06 MED ORDER — CHLORHEXIDINE GLUCONATE 0.12 % MT SOLN
OROMUCOSAL | Status: AC
  Filled 2022-10-06: qty 15

## 2022-10-06 MED ORDER — ROCURONIUM BROMIDE 100 MG/10ML IV SOLN
INTRAVENOUS | Status: DC | PRN
  Administered 2022-10-06: 40 mg via INTRAVENOUS
  Administered 2022-10-06: 60 mg via INTRAVENOUS

## 2022-10-06 MED ORDER — ESMOLOL HCL 100 MG/10ML IV SOLN
INTRAVENOUS | Status: DC | PRN
  Administered 2022-10-06 (×2): 10 mg via INTRAVENOUS
  Administered 2022-10-06: 30 mg via INTRAVENOUS

## 2022-10-06 MED ORDER — MIDAZOLAM HCL 2 MG/2ML IJ SOLN
INTRAMUSCULAR | Status: DC | PRN
  Administered 2022-10-06: 2 mg via INTRAVENOUS

## 2022-10-06 MED ORDER — SUGAMMADEX SODIUM 200 MG/2ML IV SOLN
INTRAVENOUS | Status: DC | PRN
  Administered 2022-10-06: 200 mg via INTRAVENOUS

## 2022-10-06 MED ORDER — LIDOCAINE HCL (PF) 2 % IJ SOLN
INTRAMUSCULAR | Status: AC
  Filled 2022-10-06: qty 5

## 2022-10-06 MED ORDER — ONDANSETRON HCL 4 MG/2ML IJ SOLN
INTRAMUSCULAR | Status: DC | PRN
  Administered 2022-10-06: 4 mg via INTRAVENOUS

## 2022-10-06 MED ORDER — PROPOFOL 10 MG/ML IV BOLUS
INTRAVENOUS | Status: DC | PRN
  Administered 2022-10-06: 150 mg via INTRAVENOUS

## 2022-10-06 MED ORDER — VASOPRESSIN 20 UNIT/ML IV SOLN
INTRAVENOUS | Status: AC
  Filled 2022-10-06: qty 1

## 2022-10-06 MED ORDER — EPHEDRINE SULFATE (PRESSORS) 50 MG/ML IJ SOLN
INTRAMUSCULAR | Status: DC | PRN
  Administered 2022-10-06 (×2): 5 mg via INTRAVENOUS
  Administered 2022-10-06: 2.5 mg via INTRAVENOUS
  Administered 2022-10-06: 5 mg via INTRAVENOUS

## 2022-10-06 MED ORDER — LACTATED RINGERS IV SOLN: INTRAVENOUS | Status: DC

## 2022-10-06 MED ORDER — ORAL CARE MOUTH RINSE: 15.0000 mL | Freq: Once | OROMUCOSAL | Status: AC

## 2022-10-06 MED ORDER — ROCURONIUM BROMIDE 10 MG/ML (PF) SYRINGE
PREFILLED_SYRINGE | INTRAVENOUS | Status: AC
  Filled 2022-10-06: qty 10

## 2022-10-06 MED ORDER — DEXAMETHASONE SODIUM PHOSPHATE 10 MG/ML IJ SOLN
INTRAMUSCULAR | Status: AC
  Filled 2022-10-06: qty 1

## 2022-10-06 MED ORDER — FENTANYL CITRATE (PF) 100 MCG/2ML IJ SOLN
INTRAMUSCULAR | Status: AC
  Filled 2022-10-06: qty 2

## 2022-10-06 MED ORDER — PHENYLEPHRINE HCL (PRESSORS) 10 MG/ML IV SOLN
INTRAVENOUS | Status: DC | PRN
  Administered 2022-10-06 (×2): 160 ug via INTRAVENOUS
  Administered 2022-10-06: 120 ug via INTRAVENOUS
  Administered 2022-10-06 (×2): 80 ug via INTRAVENOUS
  Administered 2022-10-06: 120 ug via INTRAVENOUS
  Administered 2022-10-06: 240 ug via INTRAVENOUS
  Administered 2022-10-06: 120 ug via INTRAVENOUS
  Administered 2022-10-06: 160 ug via INTRAVENOUS
  Administered 2022-10-06: 80 ug via INTRAVENOUS

## 2022-10-06 MED ORDER — ACETAMINOPHEN 500 MG PO TABS
1000.0000 mg | ORAL_TABLET | Freq: Once | ORAL | Status: AC
  Administered 2022-10-06: 1000 mg via ORAL

## 2022-10-06 MED ORDER — PHENYLEPHRINE 80 MCG/ML (10ML) SYRINGE FOR IV PUSH (FOR BLOOD PRESSURE SUPPORT)
PREFILLED_SYRINGE | INTRAVENOUS | Status: AC
  Filled 2022-10-06: qty 10

## 2022-10-06 MED ORDER — DEXAMETHASONE SODIUM PHOSPHATE 10 MG/ML IJ SOLN
INTRAMUSCULAR | Status: DC | PRN
  Administered 2022-10-06: 8 mg via INTRAVENOUS

## 2022-10-06 MED ORDER — ONDANSETRON HCL 4 MG/2ML IJ SOLN
INTRAMUSCULAR | Status: AC
  Filled 2022-10-06: qty 2

## 2022-10-06 MED ORDER — ACETAMINOPHEN 500 MG PO TABS
ORAL_TABLET | ORAL | Status: AC
  Filled 2022-10-06: qty 2

## 2022-10-06 MED ORDER — FENTANYL CITRATE (PF) 100 MCG/2ML IJ SOLN
INTRAMUSCULAR | Status: DC | PRN
  Administered 2022-10-06: 50 ug via INTRAVENOUS
  Administered 2022-10-06 (×2): 25 ug via INTRAVENOUS

## 2022-10-06 MED ORDER — ESMOLOL HCL 100 MG/10ML IV SOLN
INTRAVENOUS | Status: AC
  Filled 2022-10-06: qty 10

## 2022-10-06 MED ORDER — LIDOCAINE HCL (CARDIAC) PF 100 MG/5ML IV SOSY
PREFILLED_SYRINGE | INTRAVENOUS | Status: DC | PRN
  Administered 2022-10-06: 100 mg via INTRAVENOUS

## 2022-10-06 MED ORDER — EPHEDRINE 5 MG/ML INJ
INTRAVENOUS | Status: AC
  Filled 2022-10-06: qty 5

## 2022-10-06 MED ORDER — DEXMEDETOMIDINE HCL IN NACL 80 MCG/20ML IV SOLN
INTRAVENOUS | Status: DC | PRN
  Administered 2022-10-06: 12 ug via INTRAVENOUS

## 2022-10-06 NOTE — Transfer of Care (Signed)
Immediate Anesthesia Transfer of Care Note  Patient: Hannah Yu  Procedure(s) Performed: VIDEO BRONCHOSCOPY WITH ENDOBRONCHIAL ULTRASOUND ROBOTIC ASSISTED NAVIGATIONAL BRONCHOSCOPY  Patient Location: PACU  Anesthesia Type:General  Level of Consciousness: drowsy and patient cooperative  Airway & Oxygen Therapy: Patient Spontanous Breathing and Patient connected to face mask oxygen  Post-op Assessment: Report given to RN and Post -op Vital signs reviewed and stable  Post vital signs: Reviewed and stable  Last Vitals:  Vitals Value Taken Time  BP 119/62 10/06/22 1551  Temp 35.8 C 10/06/22 1551  Pulse 81 10/06/22 1555  Resp 14 10/06/22 1555  SpO2 94 % 10/06/22 1555  Vitals shown include unvalidated device data.  Last Pain:  Vitals:   10/06/22 1551  TempSrc:   PainSc: 0-No pain      Patients Stated Pain Goal: 0 (10/06/22 1051)  Complications:  Encounter Notable Events  Notable Event Outcome Phase Comment  Difficult to intubate - expected  Intraprocedure Filed from anesthesia note documentation.

## 2022-10-06 NOTE — Discharge Instructions (Addendum)
Endobronchial Ultrasound  Endobronchial ultrasound (EBUS) is a procedure done to check your lungs for inflammation, infection, or cancer. During this procedure, a thin and flexible scope (bronchoscope) is passed through your mouth and into one or both of your lungs. The bronchoscope has a video camera and an ultrasound probe on the end. The ultrasound probe uses sound waves to take imaging studies of your lung and the lymph nodes near your lung. The camera will send images of your lung to a video screen in the procedure room. A needle passed through the scope may be used to take lung tissue samples for evaluation and testing (needle biopsy). Tell a health care provider about: Any allergies you have. All medicines you are taking, including vitamins, herbs, eye drops, creams, and over-the-counter medicines. Any problems you or family members have had with anesthetic medicines. Any bleeding problems you have. Any surgeries you have had. Any medical conditions you have. Whether you are pregnant or may be pregnant. What are the risks? Generally, this is a safe procedure. However, problems may occur, including: Infection. Bleeding. Allergic reactions to medicines. Damage to nearby structures or organs. Lung collapse. What happens before the procedure? Medicines Ask your health care provider about: Changing or stopping your regular medicines. This is especially important if you are taking diabetes medicines or blood thinners. Taking medicines such as aspirin and ibuprofen. These medicines can thin your blood. Do not take these medicines unless your health care provider tells you to take them. Taking over-the-counter medicines, vitamins, herbs, and supplements. General instructions Follow instructions from your health care provider about eating and drinking, which may include having nothing to eat or drink after midnight on the night before the procedure. If you will be going home right after the  procedure, plan to have a responsible adult: Take you home from the hospital or clinic. You will not be allowed to drive. Care for you for the time you are told. What happens during the procedure? An IV will be inserted into one of your veins. You will be given one or more of the following: A medicine to help you relax (sedative). This may make you sleepy. A medicine to numb the area (local anesthetic). This medicine may be sprayed into your throat. It will make you feel more comfortable and keep you from gagging or coughing during the procedure. A medicine to make you fall asleep (general anesthetic). When you are sleepy and relaxed, the scope will be passed through your mouth, into your windpipe, and down into your lung or lungs. Your health care provider will view the video images from the camera to look at the inside of your airways. Ultrasound imaging studies may be done to get information about lung tissue or the lymph nodes near your lungs. A needle may be used to take tissue samples. When the procedure is over, the scope will be removed, and you will be taken to a recovery area. The procedure may vary among health care providers and hospitals. What can I expect after the procedure? Your blood pressure, heart rate, breathing rate, and blood oxygen level will be monitored until you leave the hospital or clinic. If a tissue sample was taken, it is up to you to get the results of your procedure. Ask your health care provider, or the department that is doing the procedure, when your results will be ready. In most cases, you may go home after the procedure. After this procedure, it is common to have a sore throat  and a slight cough. These should go away in 1-2 days. Follow these instructions at home: Medicines Take over-the-counter and prescription medicines only as told by your health care provider. If you were given a sedative during the procedure, it can affect you for several hours. Do  not drive or operate machinery until your health care provider says that it is safe. General instructions  Do not eat or drink anything until the numbing medicine (local anesthetic) has worn off and your gag reflex has returned. You will know that the local anesthetic has worn off when you can swallow comfortably. Do not use any products that contain nicotine or tobacco. These products include cigarettes, chewing tobacco, and vaping devices, such as e-cigarettes. If you need help quitting, ask your health care provider. Return to your normal activities as told by your health care provider. Ask your health care provider what activities are safe for you. Keep all follow-up visits. This is important. Contact a health care provider if: You have chills or a fever. You have a cough or sore throat that does not go away. You cough up blood. Get help right away if: You have chest pain or difficulty breathing. These symptoms may represent a serious problem that is an emergency. Do not wait to see if the symptoms will go away. Get medical help right away. Call your local emergency services (911 in the [place] Do not drive yourself to the hospital. Summary EBUS is done to check your lungs for inflammation, infection, or cancer. During this procedure, a bronchoscope is passed through your mouth and into one or both of your lungs. The bronchoscope has a video camera and an ultrasound probe on the end. The camera will send images of your lung to a video screen in the procedure room. A needle biopsy may also be done during the procedure. In most cases, you may go home after the procedure. This information is not intended to replace advice given to you by your health care provider. Make sure you discuss any questions you have with your health care provider. Document Revised: 10/16/2020 Document Reviewed: 10/16/2020 Elsevier Patient Education  2024 [employer]      AMBULATORY SURGERY  DISCHARGE  INSTRUCTIONS  The drugs that you were given will stay in your system until tomorrow so for the next 24 hours you should not:  Drive an automobile Make any legal decisions Drink any alcoholic beverage  You may resume regular meals tomorrow.  Today it is better to start with liquids and gradually work up to solid foods.  You may eat anything you prefer, but it is better to start with liquids, then soup and crackers, and gradually work up to solid foods.  Please notify your doctor immediately if you have any unusual bleeding, trouble breathing, redness and pain at the surgery site, drainage, fever, or pain not relieved by medication.  Additional Instructions:  Please contact your physician with any problems or Same Day Surgery at [phone], Monday through Friday 6 am to 4 pm, or [facility] at [facility] number at [phone].

## 2022-10-06 NOTE — Anesthesia Procedure Notes (Signed)
Procedure Name: Intubation Date/Time: 10/06/2022 1:06 PM  Performed by: Lynden Oxford, CRNAPre-anesthesia Checklist: Patient identified, Emergency Drugs available, Suction available and Patient being monitored Patient Re-evaluated:Patient Re-evaluated prior to induction Oxygen Delivery Method: Circle system utilized Preoxygenation: Pre-oxygenation with 100% oxygen Induction Type: IV induction Ventilation: Mask ventilation without difficulty Laryngoscope Size: McGraph and 3 Grade View: Grade II Tube type: Oral Tube size: 8.5 mm Number of attempts: 1 Airway Equipment and Method: Stylet and Video-laryngoscopy Placement Confirmation: ETT inserted through vocal cords under direct vision, positive ETCO2 and breath sounds checked- equal and bilateral Secured at: 22 cm Tube secured with: Tape Dental Injury: Teeth and Oropharynx as per pre-operative assessment  Difficulty Due To: Difficulty was anticipated, Difficult Airway- due to limited oral opening and Difficult Airway- due to anterior larynx

## 2022-10-06 NOTE — H&P (Signed)
PULMONOLOGY         Date: 10/06/2022,   MRN# [id number] Hannah Yu Jul 14, 1945     AdmissionWeight: 83.5 kg                 CurrentWeight: 83.5 kg  Referring provider: Dr. Meredeth Ide   CHIEF COMPLAINT:   Left lung mass with hilar adenopathy   HISTORY OF PRESENT ILLNESS   77 year old female here for evaluation of left lower lobe lung nodule.  She was seen in outpatient pulmonology clinic with findings of abnormal CT chest.  She does have a smoking history she smoked about 27 pack years from [age].  She denies constitutional symptoms but does have exertional dyspnea with mMRC of approximately 3.  She denies weight loss.  Her spirometry done in May 2024 shows an FEV1 of 1.26 L 77% predicted.  Reviewed her CT chest together with findings of lung nodules including solid 3.5 cm medial left lower lobe lung mass increased in size from previous suspicious for bronchogenic malignancy.  More central 1.2 cm left lower lobe nodule also hypermetabolic on PET.  Medial left upper lobe nodule not change and not hypermetabolic.  Patient is here today for bronchoscopic airway inspection, clearance and evacuation of any mucous plugging, bronchoalveolar lavage as well as endobronchial and transbronchial biopsies for possible diagnosis of lung cancer.   PAST MEDICAL HISTORY   Past Medical History:  Diagnosis Date   Acid reflux    Anemia    Anesthesia complication    Tachycardia previously, now on metoprolol   Arthritis    09/02/2019: per patient "have it real bad in both hands and back"   Difficult intubation    had to terminate intubation unable to advance tube for cholecystectomy 2017?   DVT (deep venous thrombosis) (HCC)    leg   Hypertension    Sciatica    09/02/2019: has had it for about 3 years     SURGICAL HISTORY   Past Surgical History:  Procedure Laterality Date   ABDOMINAL HYSTERECTOMY     CATARACT EXTRACTION W/ INTRAOCULAR LENS IMPLANT Bilateral 2007   eyes done  within 1 month of each other.   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   TOTAL HIP ARTHROPLASTY Right 09/06/2019   Procedure: RIGHT TOTAL HIP ARTHROPLASTY ANTERIOR APPROACH;  Surgeon: Kathryne Hitch, MD;  Location: [facility] OR;  Service: Orthopedics;  Laterality: Right;   TOTAL HIP ARTHROPLASTY Left 07/31/2020   Procedure: LEFT TOTAL HIP ARTHROPLASTY ANTERIOR APPROACH;  Surgeon: Kathryne Hitch, MD;  Location: [facility] OR;  Service: Orthopedics;  Laterality: Left;     FAMILY HISTORY   Family History  Problem Relation Age of Onset   Breast cancer Sister 60     SOCIAL HISTORY   Social History   Tobacco Use   Smoking status: Former    Types: Cigarettes    Quit date: 01/02/1993    Years since quitting: 29.7   Smokeless tobacco: Never  Vaping Use   Vaping Use: Never used  Substance Use Topics   Alcohol use: Yes    Alcohol/week: 4.0 standard drinks of alcohol    Types: 4 Glasses of wine per week    Comment: 09/02/2019: per patient "ever so often"   Drug use: No     MEDICATIONS    Home Medication:    Current Medication:  Current Facility-Administered Medications:    lactated ringers infusion, , Intravenous, Continuous,  Stephanie Coup, MD    ALLERGIES   Codeine, Cortisone, and Lovastatin     REVIEW OF SYSTEMS    Review of Systems:  Gen:  Denies  fever, sweats, chills weigh loss  HEENT: Denies blurred vision, double vision, ear pain, eye pain, hearing loss, nose bleeds, sore throat Cardiac:  No dizziness, chest pain or heaviness, chest tightness,edema Resp:   reports dyspnea chronically  Gi: Denies swallowing difficulty, stomach pain, nausea or vomiting, diarrhea, constipation, bowel incontinence Gu:  Denies bladder incontinence, burning urine Ext:   Denies Joint pain, stiffness or swelling Skin: Denies  skin rash, easy bruising or bleeding or hives Endoc:  Denies polyuria, polydipsia ,  polyphagia or weight change Psych:   Denies depression, insomnia or hallucinations   Other:  All other systems negative   VS: BP (!) 191/77   Pulse 82   Temp 98.2 F (36.8 C) (Oral)   Resp 16   Ht 5' (1.524 m)   Wt 83.5 kg   SpO2 95%   BMI 35.95 kg/m      PHYSICAL EXAM    GENERAL:NAD, no fevers, chills, no weakness no fatigue HEAD: Normocephalic, atraumatic.  EYES: Pupils equal, round, reactive to light. Extraocular muscles intact. No scleral icterus.  MOUTH: Moist mucosal membrane. Dentition intact. No abscess noted.  EAR, NOSE, THROAT: Clear without exudates. No external lesions.  NECK: Supple. No thyromegaly. No nodules. No JVD.  PULMONARY: decreased breath sounds with mild rhonchi worse at bases bilaterally.  CARDIOVASCULAR: S1 and S2. Regular rate and rhythm. No murmurs, rubs, or gallops. No edema. Pedal pulses 2+ bilaterally.  GASTROINTESTINAL: Soft, nontender, nondistended. No masses. Positive bowel sounds. No hepatosplenomegaly.  MUSCULOSKELETAL: No swelling, clubbing, or edema. Range of motion full in all extremities.  NEUROLOGIC: Cranial nerves II through XII are intact. No gross focal neurological deficits. Sensation intact. Reflexes intact.  SKIN: No ulceration, lesions, rashes, or cyanosis. Skin warm and dry. Turgor intact.  PSYCHIATRIC: Mood, affect within normal limits. The patient is awake, alert and oriented x 3. Insight, judgment intact.       IMAGING   Narrative & Impression  CLINICAL DATA:  Follow-up pulmonary nodules. * Tracking Code: BO *   EXAM: CT CHEST WITHOUT CONTRAST   TECHNIQUE: Multidetector CT imaging of the chest was performed following the standard protocol without IV contrast.   RADIATION DOSE REDUCTION: This exam was performed according to the departmental dose-optimization program which includes automated exposure control, adjustment of the mA and/or kV according to patient size and/or use of iterative reconstruction  technique.   COMPARISON:  09/30/2022 PET-CT.  08/27/2022 chest CT angiogram.   FINDINGS: Cardiovascular: Top-normal heart size. No significant pericardial effusion/thickening. Left anterior descending coronary atherosclerosis. Atherosclerotic nonaneurysmal thoracic aorta. Normal caliber pulmonary arteries.   Mediastinum/Nodes: No significant thyroid nodules. Unremarkable esophagus. No pathologically enlarged axillary, mediastinal or hilar lymph nodes, noting limited sensitivity for the detection of hilar adenopathy on this noncontrast study.   Lungs/Pleura: No pneumothorax. No pleural effusion. Solid 3.5 x 1.8 cm medial left lower lobe lung mass (series 4/image 71), increased from 3.1 x 1.7 cm on 08/27/2022 CT. More central 1.2 x 1.0 cm solid left lower lobe pulmonary nodule (series 4/image 57), slightly increased from 1.0 x 0.9 cm. Medial left upper lobe 1.1 x 1.0 cm solid pulmonary nodule (series 4/image 60), previously 1.2 x 1.0 cm using similar measurement technique, not substantially changed. No additional significant pulmonary nodules. Mild paraseptal emphysema.   Upper abdomen: Cholelithiasis.  Colonic diverticulosis.  Musculoskeletal: No aggressive appearing focal osseous lesions. Mild thoracic spondylosis.   IMPRESSION: 15. One vessel coronary atherosclerosis. 6. Cholelithiasis. 7. Colonic diverticulosis. 8. Aortic Atherosclerosis (ICD10-I70.0) and Emphysema (ICD10-J43.9).     Electronically Signed   By: Delbert Phenix M.D.   On: 10/04/2022 20:55    ASSESSMENT/PLAN   Left lower lobe 3.5cm lung mass  Additional areas with abnormal lesion including . Solid 3.5 cm medial left lower lobe lung mass, increased in size since 08/27/2022 CT, seen to be hypermetabolic on recent PET-CT, highly suspicious for primary bronchogenic malignancy. Multidisciplinary thoracic oncology consultation advised. 2. More central 1.2 cm solid left lower lobe pulmonary nodule, slightly  increased in size, also hypermetabolic on recent PET-CT, suspicious for pulmonary metastasis within the same lobe. 3. Medial left upper lobe 1.1 cm solid pulmonary nodule, not substantially changed, not strictly hypermetabolic on recent PET-CT. Continued chest CT surveillance warranted for this nodule.   -Reviewed risks/complications and benefits with patient, risks include infection, pneumothorax/pneumomediastinum which may require chest tube placement as well as overnight/prolonged hospitalization and possible mechanical ventilation. Other risks include bleeding and very rarely death.  Patient understands risks and wishes to proceed.  Additional questions were answered, and patient is aware that post procedure patient will be going home with family and may experience cough with possible clots on expectoration as well as phlegm which may last few days as well as hoarseness of voice post intubation and mechanical ventilation.         Thank you for allowing me to participate in the care of this patient.   Patient/Family are satisfied with care plan and all questions have been answered.    Provider disclosure: Patient with at least one acute or chronic illness or injury that poses a threat to life or bodily function and is being managed actively during this encounter.  All of the below services have been performed independently by signing provider:  review of prior documentation from internal and or external health records.  Review of previous and current lab results.  Interview and comprehensive assessment during patient visit today. Review of current and previous chest radiographs/CT scans. Discussion of management and test interpretation with health care team and patient/family.   This document was prepared using Dragon voice recognition software and may include unintentional dictation errors.     Vida Rigger, M.D.  Division of Pulmonary & Critical Care Medicine

## 2022-10-06 NOTE — Procedures (Signed)
ROBOTIC NAVIGATIONAL BRONCHOSCOPY PROCEDURE NOTE  FIBEROPTIC BRONCHOSCOPY WITH BRONCHOALVEOLAR LAVAGE PROCEDURE NOTE  ENDOBRONCHIAL ULTRASOUND PROCEDURE NOTE    Flexible bronchoscopy was performed  by : Karna Christmas MD  assistance by : 1)Repiratory therapist  and 2)[facility] staff and 3) Anesthesia team and 4) Flouroscopy team and 5) [facility] STAFF   Indication for the procedure was :  Pre-procedural H&P. The following assessment was performed on the day of the procedure prior to initiating sedation History:  Chest pain n Dyspnea y Hemoptysis n Cough y Fever n Other pertinent items n  Examination Vital signs -reviewed as per nursing documentation today Cardiac    Murmurs: n  Rubs : n  Gallop: n Lungs Wheezing: n Rales : n Rhonchi :y  Other pertinent findings: SOB/hypoxemia due to chronic lung disease   Pre-procedural assessment for Procedural Sedation included: Depth of sedation: As per anesthesia team  ASA Classification:  2 Mallampati airway assessment: 3    Medication list reviewed: y  The patient's interval history was taken and revealed: no new complaints The pre- procedure physical examination revealed: No new findings Refer to prior clinic note for details.  Informed Consent: Informed consent was obtained from:  patient after explanation of procedure and risks, benefits, as well as alternative procedures available.  Explanation of level of sedation and possible transfusion was also provided.    Procedural Preparation: Time out was performed and patient was identified by name and birthdate and procedure to be performed and side for sampling, if any, was specified. Pt was intubated by anesthesia.  The patient was appropriately draped.   Fiberoptic bronchoscopy with airway inspection and BAL Procedure findings:  Bronchoscope was inserted via ETT  without difficulty.  Posterior oropharynx, epiglottis, arytenoids, false cords and vocal cords were not  visualized as these were bypassed by endotracheal tube. The distal trachea was normal in circumference and appearance without mucosal, cartilaginous or branching abnormalities.  The main carina was mildly splayed . All right and left lobar airways were visualized to the Subsegmental level.  Sub- sub segmental carinae were identified in all the distal airways.   Secretions were visible in the following airways and appeared to be clear.  The mucosa was : friable at LLL  Airways were notable for:        exophytic lesions :n       extrinsic compression in the following distributions: n.       Friable mucosa: y       Teacher, music /pigmentation: n    Mucus plugging was aspirated and was worse at left lower lobe.  BAL was performed at LLL.  Friable mucosa was noted at LLL.  Minor instrumentation resulting in bleeding.   Post procedure Diagnosis:   Friable mucosa of LLL with mucus plugging     [facility] Navigational Bronchoscopy Procedure Findings:   Post appropriate planning and registration peripheral navigation was used to visualize target lesion.    Post procedure diagnosis: lung mass  CYTOBRUSH X 3  ENDOBRONCHIAL AT LEFT LOWER LOBE - CYTOLOGY  TRANSBRONCHIAL BIOPSY FNA X 6 AT LEFT LOWER LOBE - CYTOLOGY  BAL AT LLL - CYTOLOGY       Endobronchial ultrasound assisted hilar and mediastinal lymph node biopsies procedure findings: The fiberoptic bronchoscope was removed and the EBUS scope was introduced. Examination began to evaluate for pathologically enlarged lymph nodes starting on the RIGHT  side progressing to the LEFT side.  All lymph node biopsies performed with 21G needle. Lymph node biopsies were sent  in cytolite for all stations.  STATION 7 - 1.4cm - biopsied 3 times   Post procedure diagnosis:  lymphadenopathy   Specimens obtained included: Microbiology brushes: none ; sent for none                      Cytology brushes : yes at [facility]   Broncho-alveolar  lavage site:LLL   sent for cytology                              50ml volume infused 24ml volume returned with serosang/bloodly appearance  Endobronchial biopsy site:  LLL; sent for cytology                                 Transbronchial biopsy site: LLL ; sent for cytology       Fluoroscopy Used: yes ;        Pictorial documentation attached: none                 Transbronchial WANG needle aspiration site: LLL sent for: cytology                                     Immediate sampling complications included:none  Epinephrine none ml was used topically  The bronchoscopy was terminated due to completion of the planned procedure and the bronchoscope was removed.   Total dosage of Lidocaine was none mg Total fluoroscopy time was as per radiology  minutes  Supplemental oxygen was provided at as per asnesthesia lpm by nasal canula post operatively  Estimated Blood loss: <10 expected cc.  Complications included:  None immediate   Preliminary CXR findings :  In process  Disposition: home with family   Follow up with Dr. Karna Christmas in 5 days for result discussion.     Vida Rigger MD  [facility] Division of Pulmonary & Critical Care Medicine

## 2022-10-06 NOTE — Anesthesia Preprocedure Evaluation (Addendum)
Anesthesia Evaluation  Patient identified by MRN, date of birth, ID band Patient awake    Reviewed: Allergy & Precautions, NPO status , Patient's Chart, lab work & pertinent test results  History of Anesthesia Complications (+) history of anesthetic complications ("Difficult intubation." However no difficulty was documented during intubation. Last intubation Grade 1 w/ miller 2. Case was canceled after induction due to SVT.)  Airway Mallampati: III  TM Distance: >3 FB Neck ROM: Full    Dental  (+) Teeth Intact   Pulmonary former smoker Lung imaging: IMPRESSION: 1. Hypermetabolic solid 3.5 cm medial left lower lobe lung mass, suspicious for primary bronchogenic malignancy. 2. More central hypermetabolic 0.9 cm solid left lower lobe pulmonary nodule, suspicious for intrapulmonary metastasis. 3. Medial left upper lobe solid 1.0 cm pulmonary nodule demonstrates low level FDG uptake, not strictly hypermetabolic, warranting continued chest CT surveillance. 4. No hypermetabolic thoracic adenopathy. 5. Indeterminate hypermetabolic left external iliac and low left para-aortic lymphadenopathy, cannot exclude metastatic adenopathy. 6. No evidence of hypermetabolic skeletal metastatic disease. 7. Status post bilateral total hip arthroplasty with periprosthetic hypermetabolism in both hips, asymmetrically prominent on the left, nonspecific, presumably inflammatory. 8. Cholelithiasis. 9. Moderate diffuse colonic diverticulosis. 10.  Aortic Atherosclerosis (ICD10-I70.0).    Pulmonary exam normal        Cardiovascular Exercise Tolerance: Poor hypertension, Pt. on medications and Pt. on home beta blockers  Rhythm:Regular Rate:Normal  Nuclear stress 02/05/16: Negative Lexiscan stress.  LV function normal.  No evidence of reversible  ischemia.  Low risk study.  TTE10/2/17: INTERPRETATION NORMAL LEFT VENTRICULAR SYSTOLIC FUNCTION   WITH  MILD LVH NORMAL RIGHT VENTRICULAR SYSTOLIC FUNCTION MILD VALVULAR REGURGITATION (See above) NO VALVULAR STENOSIS EF >55% Mitral: MILD MR Tricuspid: MILD TR Closest EF: >55% (Estimated     Neuro/Psych  Neuromuscular disease (sciatica)  negative psych ROS   GI/Hepatic Neg liver ROS,GERD  Medicated,,  Endo/Other  negative endocrine ROS    Renal/GU negative Renal ROS  negative genitourinary   Musculoskeletal  (+) Arthritis , Osteoarthritis,    Abdominal  (+)  Abdomen: soft. Bowel sounds: normal.  Peds  Hematology negative hematology ROS (+)   Anesthesia Other Findings   Reproductive/Obstetrics                             Anesthesia Physical Anesthesia Plan  ASA: 3  Anesthesia Plan: General   Post-op Pain Management: Minimal or no pain anticipated   Induction: Intravenous  PONV Risk Score and Plan: 2 and Ondansetron, Dexamethasone and Propofol infusion  Airway Management Planned: Oral ETT  Additional Equipment: None  Intra-op Plan:   Post-operative Plan:   Informed Consent: I have reviewed the patients History and Physical, chart, labs and discussed the procedure including the risks, benefits and alternatives for the proposed anesthesia with the patient or authorized representative who has indicated his/her understanding and acceptance.     Dental advisory given  Plan Discussed with: CRNA  Anesthesia Plan Comments:         Anesthesia Quick Evaluation

## 2022-10-07 ENCOUNTER — Encounter: Payer: Self-pay | Admitting: Pulmonary Disease

## 2022-10-08 ENCOUNTER — Other Ambulatory Visit: Payer: Self-pay | Admitting: Pathology

## 2022-10-08 LAB — CYTOLOGY - NON PAP

## 2022-10-10 ENCOUNTER — Encounter: Payer: Self-pay | Admitting: *Deleted

## 2022-10-10 ENCOUNTER — Encounter: Payer: Self-pay | Admitting: Oncology

## 2022-10-10 ENCOUNTER — Inpatient Hospital Stay: Payer: Medicare Other | Attending: Oncology | Admitting: Oncology

## 2022-10-10 ENCOUNTER — Inpatient Hospital Stay: Payer: Medicare Other

## 2022-10-10 VITALS — BP 146/82 | HR 76 | Temp 98.8°F | Resp 18 | Ht 60.0 in | Wt 183.4 lb

## 2022-10-10 DIAGNOSIS — R599 Enlarged lymph nodes, unspecified: Secondary | ICD-10-CM | POA: Insufficient documentation

## 2022-10-10 DIAGNOSIS — Z79899 Other long term (current) drug therapy: Secondary | ICD-10-CM | POA: Insufficient documentation

## 2022-10-10 DIAGNOSIS — C3432 Malignant neoplasm of lower lobe, left bronchus or lung: Secondary | ICD-10-CM | POA: Diagnosis present

## 2022-10-10 DIAGNOSIS — C3490 Malignant neoplasm of unspecified part of unspecified bronchus or lung: Secondary | ICD-10-CM

## 2022-10-10 DIAGNOSIS — Z803 Family history of malignant neoplasm of breast: Secondary | ICD-10-CM | POA: Diagnosis not present

## 2022-10-10 DIAGNOSIS — Z87891 Personal history of nicotine dependence: Secondary | ICD-10-CM | POA: Diagnosis not present

## 2022-10-10 DIAGNOSIS — R590 Localized enlarged lymph nodes: Secondary | ICD-10-CM | POA: Diagnosis not present

## 2022-10-10 NOTE — Progress Notes (Signed)
Met with patient during initial consult with Dr. Smith Robert. All questions answered during visit. Pt informed that will be called with recommendations received from tumor board discussion as well as appt for biopsy once scheduled. Reviewed all other appts with pt. Contact info given and instructed to call with any questions or needs. Pt verbalized understanding.

## 2022-10-12 ENCOUNTER — Encounter: Payer: Self-pay | Admitting: Oncology

## 2022-10-12 NOTE — Progress Notes (Signed)
Hematology/Oncology Consult note [facility] Telephone:([phone] [phone]  Patient Care Team: Jerl Mina, MD as PCP - General (Family Medicine) Glory Buff, RN as Oncology Nurse Navigator   Name of the patient: Hannah Yu  [id number]  11-10-1945    Reason for referral-new diagnosis of adenocarcinoma of the lung   Referring physician- Dr. Karna Christmas  Date of visit: 10/12/22   History of presenting illness- Patient is a 77 year old female who presented with symptoms of shortness of breath in April 2024.  This was followed by a CT angiogram which showed no evidence of PE but a 2.7 x 2.1 cm irregular nodule in the left lower lobe.  There was a 13 x 10 mm nodule in the left upper lobe findings concerning for bronchogenic carcinoma.  This was followed by a PET CT scan which showed a hypermetabolic solid 3.5 x 1.8 cm left lower lobe medial lung mass with an SUV of 11.8 a more central 0.9 cm left lower lobe pulmonary nodule with an SUV of 4.9.  Medial left upper lobe solid 1 cm nodule with a low level of FDG uptake with an SUV of 2.3.  No enlarged hypermetabolic axillary mediastinal or hilar lymph nodes.  Mildly hypermetabolic 1 cm lower left para-aortic lymph node with an SUV of 3.2.  Multiple enlarged hypermetabolic left external iliac lymph nodes largest 1.8 cm with an SUV of 6.6.  No hypermetabolic right pelvic nodes.  Patient had bronchoscopy and biopsy of the left lower lobe 3.65 cm lesion which was consistent with non-small cell lung cancer favoring adenocarcinoma.  Carcinoma positive for CK7 and TTF-1 focally and negative for p40.  Station 7 lymph node was negative for malignancy.  Patient referred for further management.  Patient is doing well for her age and does not require any oxygen at baseline.  She is able to carry out her ADLs and IADLs presently without any significant shortness of breath.  ECOG PS- 1  Pain scale- 0   Review of  systems- Review of Systems  Constitutional:  Negative for chills, fever, malaise/fatigue and weight loss.  HENT:  Negative for congestion, ear discharge and nosebleeds.   Eyes:  Negative for blurred vision.  Respiratory:  Negative for cough, hemoptysis, sputum production, shortness of breath and wheezing.   Cardiovascular:  Negative for chest pain, palpitations, orthopnea and claudication.  Gastrointestinal:  Negative for abdominal pain, blood in stool, constipation, diarrhea, heartburn, melena, nausea and vomiting.  Genitourinary:  Negative for dysuria, flank pain, frequency, hematuria and urgency.  Musculoskeletal:  Negative for back pain, joint pain and myalgias.  Skin:  Negative for rash.  Neurological:  Negative for dizziness, tingling, focal weakness, seizures, weakness and headaches.  Endo/Heme/Allergies:  Does not bruise/bleed easily.  Psychiatric/Behavioral:  Negative for depression and suicidal ideas. The patient does not have insomnia.     Allergies  Allergen Reactions   Codeine Diarrhea   Cortisone Swelling    injections   Lovastatin Other (See Comments)    Muscle cramps    Patient Active Problem List   Diagnosis Date Noted   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   Unilateral primary osteoarthritis, left hip 04/25/2020   Status post total replacement of right hip 09/06/2019   Pelvic pain in female 07/20/2019   Hyperlipidemia 07/20/2019   B12 deficiency 07/20/2019   Neuropathic pain of left hand 06/23/2017   Spinal stenosis of lumbosacral region 11/11/2016   Primary osteoarthritis of right hip 11/11/2016   Obesity (  BMI 30.0-34.9) 09/12/2016   H/O supraventricular tachycardia 01/14/2016   Hypertension 12/24/2015   Back pain with right-sided sciatica 12/24/2015   Gallstone 10/19/2015   Biliary colic 10/19/2015   Diverticulitis 06/28/2014     Past Medical History:  Diagnosis Date   Acid reflux    Anemia    Anesthesia complication    Tachycardia  previously, now on metoprolol   Arthritis    09/02/2019: per patient "have it real bad in both hands and back"   Difficult intubation    had to terminate intubation unable to advance tube for cholecystectomy 2017?   DVT (deep venous thrombosis) (HCC)    leg   Hypertension    Sciatica    09/02/2019: has had it for about 3 years     Past Surgical History:  Procedure Laterality Date   ABDOMINAL HYSTERECTOMY     CATARACT EXTRACTION W/ INTRAOCULAR LENS IMPLANT Bilateral 2007   eyes done within 1 month of each other.   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   TOTAL HIP ARTHROPLASTY Right 09/06/2019   Procedure: RIGHT TOTAL HIP ARTHROPLASTY ANTERIOR APPROACH;  Surgeon: Kathryne Hitch, MD;  Location: [facility] OR;  Service: Orthopedics;  Laterality: Right;   TOTAL HIP ARTHROPLASTY Left 07/31/2020   Procedure: LEFT TOTAL HIP ARTHROPLASTY ANTERIOR APPROACH;  Surgeon: Kathryne Hitch, MD;  Location: [facility] OR;  Service: Orthopedics;  Laterality: Left;   VIDEO BRONCHOSCOPY WITH ENDOBRONCHIAL ULTRASOUND N/A 10/06/2022   Procedure: VIDEO BRONCHOSCOPY WITH ENDOBRONCHIAL ULTRASOUND;  Surgeon: Vida Rigger, MD;  Location: [facility] ORS;  Service: Thoracic;  Laterality: N/A;    Social History   Socioeconomic History   Marital status: Single    Spouse name: Not on file   Number of children: Not on file   Years of education: Not on file   Highest education level: Not on file  Occupational History   Not on file  Tobacco Use   Smoking status: Former    Types: Cigarettes    Quit date: 01/02/1993    Years since quitting: 29.7   Smokeless tobacco: Never  Vaping Use   Vaping Use: Never used  Substance and Sexual Activity   Alcohol use: Yes    Alcohol/week: 4.0 standard drinks of alcohol    Types: 4 Glasses of wine per week    Comment: 09/02/2019: per patient "ever so often"   Drug use: No   Sexual activity: Not on file  Other  Topics Concern   Not on file  Social History Narrative   Not on file   Social Determinants of Health   Financial Resource Strain: Not on file  Food Insecurity: Not on file  Transportation Needs: Not on file  Physical Activity: Not on file  Stress: Not on file  Social Connections: Not on file  Intimate Partner Violence: Not on file     Family History  Problem Relation Age of Onset   Breast cancer Sister 59     Current Outpatient Medications:    amLODipine (NORVASC) 10 MG tablet, Take 10 mg by mouth daily., Disp: , Rfl:    aspirin EC 81 MG tablet, Take 162 mg by mouth daily. Swallow whole., Disp: , Rfl:    cholecalciferol (VITAMIN D3) 25 MCG (1000 UNIT) tablet, Take 1,000 Units by mouth daily., Disp: , Rfl:    cyclobenzaprine (FLEXERIL) 5 MG tablet, Take 5 mg by mouth 3 (three) times daily as needed for muscle spasms., Disp: ,  Rfl:    FIBER PO, Take 1 tablet by mouth daily., Disp: , Rfl:    furosemide (LASIX) 40 MG tablet, Take 40 mg by mouth daily., Disp: , Rfl:    ibuprofen (ADVIL) 200 MG tablet, Take 400 mg by mouth every 6 (six) hours as needed for mild pain or moderate pain., Disp: , Rfl:    Ibuprofen-diphenhydrAMINE HCl (IBUPROFEN PM) 200-25 MG CAPS, Take 2 tablets by mouth at bedtime as needed (sleep)., Disp: , Rfl:    losartan (COZAAR) 100 MG tablet, Take 100 mg by mouth daily., Disp: , Rfl:    magnesium oxide (MAG-OX) 400 (240 Mg) MG tablet, Take 400 mg by mouth daily., Disp: , Rfl:    metoprolol succinate (TOPROL-XL) 50 MG 24 hr tablet, Take 50 mg by mouth daily., Disp: , Rfl:    omeprazole (PRILOSEC) 40 MG capsule, Take 40 mg by mouth daily as needed., Disp: , Rfl:    OVER THE COUNTER MEDICATION, Take 1 capsule by mouth daily. CBD gummie, Disp: , Rfl:    Tetrahydrozoline HCl (VISINE OP), Place 1 drop into both eyes daily as needed (dry eyes)., Disp: , Rfl:    vitamin B-12 (CYANOCOBALAMIN) 1000 MCG tablet, Take 1,000 mcg by mouth daily., Disp: , Rfl:    Docusate Calcium  (STOOL SOFTENER PO), Take 1 tablet by mouth daily as needed (cpmsto[atopm). (Patient not taking: Reported on 09/26/2022), Disp: , Rfl:    Physical exam:  Vitals:   10/10/22 1043 10/10/22 1045  BP: (!) 154/73 (!) 146/82  Pulse: 94 76  Resp: 18   Temp: 98.8 F (37.1 C)   TempSrc: Tympanic   SpO2: 95%   Weight: 183 lb 6.4 oz (83.2 kg)   Height: 5' (1.524 m)    Physical Exam Cardiovascular:     Rate and Rhythm: Normal rate and regular rhythm.     Heart sounds: Normal heart sounds.  Pulmonary:     Effort: Pulmonary effort is normal.     Breath sounds: Normal breath sounds.  Abdominal:     General: Bowel sounds are normal.     Palpations: Abdomen is soft.  Skin:    General: Skin is warm and dry.  Neurological:     Mental Status: She is alert and oriented to person, place, and time.           Latest Ref Rng & Units 08/01/2020    8:59 AM  CMP  Glucose 70 - 99 mg/dL 161   BUN 8 - 23 mg/dL 7   Creatinine 0.96 - 0.45 mg/dL 4.09   Sodium 811 - 914 mmol/L 134   Potassium 3.5 - 5.1 mmol/L 3.7   Chloride 98 - 111 mmol/L 100   CO2 22 - 32 mmol/L 28   Calcium 8.9 - 10.3 mg/dL 8.8       Latest Ref Rng & Units 08/01/2020    8:59 AM  CBC  WBC 4.0 - 10.5 K/uL 11.0   Hemoglobin 12.0 - 15.0 g/dL 78.2   Hematocrit 95.6 - 46.0 % 36.9   Platelets 150 - 400 K/uL 330     No images are attached to the encounter.  DG Chest Port 1 View  Result Date: 10/06/2022 CLINICAL DATA:  Status post bronchoscopy. EXAM: PORTABLE CHEST 1 VIEW COMPARISON:  Chest radiograph 09/08/2019.  Chest CT 10/03/2022. FINDINGS: Low lung volumes accentuate the pulmonary vasculature and cardiomediastinal silhouette. Areas of linear atelectasis in the left lower lung. No pleural effusion or pneumothorax. Known left-sided lung nodules  are not well visualized on single frontal radiograph. Stable cardiac and mediastinal contours. IMPRESSION: Low lung volumes with linear atelectasis in the left lower lung. No pneumothorax.  Electronically Signed   By: Orvan Falconer M.D.   On: 10/06/2022 16:24   DG C-Arm 1-60 Min-No Report  Result Date: 10/06/2022 Fluoroscopy was utilized by the requesting physician.  No radiographic interpretation.   DG C-Arm 1-60 Min-No Report  Result Date: 10/06/2022 Fluoroscopy was utilized by the requesting physician.  No radiographic interpretation.   CT CHEST WO CONTRAST  Result Date: 10/04/2022 CLINICAL DATA:  Follow-up pulmonary nodules. * Tracking Code: BO * EXAM: CT CHEST WITHOUT CONTRAST TECHNIQUE: Multidetector CT imaging of the chest was performed following the standard protocol without IV contrast. RADIATION DOSE REDUCTION: This exam was performed according to the departmental dose-optimization program which includes automated exposure control, adjustment of the mA and/or kV according to patient size and/or use of iterative reconstruction technique. COMPARISON:  09/30/2022 PET-CT.  08/27/2022 chest CT angiogram. FINDINGS: Cardiovascular: Top-normal heart size. No significant pericardial effusion/thickening. Left anterior descending coronary atherosclerosis. Atherosclerotic nonaneurysmal thoracic aorta. Normal caliber pulmonary arteries. Mediastinum/Nodes: No significant thyroid nodules. Unremarkable esophagus. No pathologically enlarged axillary, mediastinal or hilar lymph nodes, noting limited sensitivity for the detection of hilar adenopathy on this noncontrast study. Lungs/Pleura: No pneumothorax. No pleural effusion. Solid 3.5 x 1.8 cm medial left lower lobe lung mass (series 4/image 71), increased from 3.1 x 1.7 cm on 08/27/2022 CT. More central 1.2 x 1.0 cm solid left lower lobe pulmonary nodule (series 4/image 57), slightly increased from 1.0 x 0.9 cm. Medial left upper lobe 1.1 x 1.0 cm solid pulmonary nodule (series 4/image 60), previously 1.2 x 1.0 cm using similar measurement technique, not substantially changed. No additional significant pulmonary nodules. Mild paraseptal emphysema.  Upper abdomen: Cholelithiasis.  Colonic diverticulosis. Musculoskeletal: No aggressive appearing focal osseous lesions. Mild thoracic spondylosis. IMPRESSION: 1. Solid 3.5 cm medial left lower lobe lung mass, increased in size since 08/27/2022 CT, seen to be hypermetabolic on recent PET-CT, highly suspicious for primary bronchogenic malignancy. Multidisciplinary thoracic oncology consultation advised. 2. More central 1.2 cm solid left lower lobe pulmonary nodule, slightly increased in size, also hypermetabolic on recent PET-CT, suspicious for pulmonary metastasis within the same lobe. 3. Medial left upper lobe 1.1 cm solid pulmonary nodule, not substantially changed, not strictly hypermetabolic on recent PET-CT. Continued chest CT surveillance warranted for this nodule. 4. No thoracic adenopathy. 5. One vessel coronary atherosclerosis. 6. Cholelithiasis. 7. Colonic diverticulosis. 8. Aortic Atherosclerosis (ICD10-I70.0) and Emphysema (ICD10-J43.9). Electronically Signed   By: Delbert Phenix M.D.   On: 10/04/2022 20:55   NM PET Image Initial (PI) Skull Base To Thigh (F-18 FDG)  Result Date: 10/04/2022 CLINICAL DATA:  Initial treatment strategy for left lower lobe and left upper lobe pulmonary nodules. EXAM: NUCLEAR MEDICINE PET SKULL BASE TO THIGH TECHNIQUE: 10.2 mCi F-18 FDG was injected intravenously. Full-ring PET imaging was performed from the skull base to thigh after the radiotracer. CT data was obtained and used for attenuation correction and anatomic localization. Fasting blood glucose: 116 mg/dl COMPARISON:  40/98/1191 chest CT angiogram. FINDINGS: Mediastinal blood pool activity: SUV max 2.8 Liver activity: SUV max NA NECK: No hypermetabolic lymph nodes in the neck. Incidental CT findings: None. CHEST: Hypermetabolic solid 3.5 x 1.8 cm medial left lower lobe lung mass with max SUV 11.8 (series 2/image 58). More central 0.9 cm solid left lower lobe pulmonary nodule is hypermetabolic with max SUV 4.9 (series  2/image 52).  Medial left upper lobe solid 1.0 cm pulmonary nodule (series 2/image 52) demonstrates low level FDG uptake with max SUV 2.3. No enlarged or hypermetabolic axillary, mediastinal or hilar lymph nodes. Incidental CT findings: Coronary atherosclerosis. Atherosclerotic nonaneurysmal thoracic aorta. ABDOMEN/PELVIS: Mildly enlarged and mildly hypermetabolic 1.0 cm low left para-aortic node with max SUV 3.2 (series 2/image 105). Multiple enlarged hypermetabolic left external iliac lymph nodes, largest 1.8 cm with max SUV 6.6 (series 2/image 127). No enlarged or hypermetabolic right pelvic nodes. No abnormal hypermetabolic activity within the liver, pancreas, adrenal glands, or spleen. Incidental CT findings: Cholelithiasis. Atherosclerotic nonaneurysmal abdominal aorta. Hysterectomy. Moderate diffuse colonic diverticulosis. SKELETON: No focal hypermetabolic activity to suggest skeletal metastasis. Status post bilateral total hip arthroplasty with periprosthetic hypermetabolism in both hips, asymmetrically prominent on the left. Incidental CT findings: None. IMPRESSION: 1. Hypermetabolic solid 3.5 cm medial left lower lobe lung mass, suspicious for primary bronchogenic malignancy. 2. More central hypermetabolic 0.9 cm solid left lower lobe pulmonary nodule, suspicious for intrapulmonary metastasis. 3. Medial left upper lobe solid 1.0 cm pulmonary nodule demonstrates low level FDG uptake, not strictly hypermetabolic, warranting continued chest CT surveillance. 4. No hypermetabolic thoracic adenopathy. 5. Indeterminate hypermetabolic left external iliac and low left para-aortic lymphadenopathy, cannot exclude metastatic adenopathy. 6. No evidence of hypermetabolic skeletal metastatic disease. 7. Status post bilateral total hip arthroplasty with periprosthetic hypermetabolism in both hips, asymmetrically prominent on the left, nonspecific, presumably inflammatory. 8. Cholelithiasis. 9. Moderate diffuse colonic  diverticulosis. 10.  Aortic Atherosclerosis (ICD10-I70.0). Electronically Signed   By: Delbert Phenix M.D.   On: 10/04/2022 20:48    Assessment and plan- Patient is a 77 y.o. female with newly diagnosed left lower lobe adenocarcinoma see T2 versus T3 N0 MX ER to discuss further management  I have reviewed CT and PET CT scan images independently and discussed findings with the patient.  PET CT scan shows 3 lung nodules of concern.  2 of which are in the left lower lobe.  The largest 3.5 cm solid mass was biopsied and was consistent with non-small cell lung cancer favoring adenocarcinoma.  There was a more central 0.9 cm nodule with an SUV of 4.9 which appears to be concerning intrapulmonary metastatic site.  There was another left upper lobe 1 cm nodule which was demonstrating a low level of FDG uptake.  There were also hypermetabolic left external iliac and left para-aortic lymph nodes noted the largest measuring 1.8 cm with an SUV of 6.6 and metastatic adenopathy was not excluded.  I have explained all these findings to the patient.  Given that patient does not have any hilar or mediastinal adenopathy it is unclear if these external iliac lymph nodes are related to her lung cancer or not.  I have discussed her case with IR Dr. Fredia Sorrow and he is agreeable to sampling these external iliac lymph nodes under CT guidance.  Patient is agreeable to proceed with the same.  I would like her to get a CT head with contrast to rule out metastatic disease to the brain since patient cannot tolerate MRI.  I would like to discuss her case at tumor board next week.  Patient has T2 versus T3 disease based on noncontiguous left lower lobe subcentimeter nodule.  Regardless I do think patient should see thoracic surgery to see if she would be a surgical candidate and if the left upper lobe nodule can continue to be observed if external iliac lymph node biopsy is negative  Patient would like to hold off on  seeing thoracic  surgery at this time.  She would like a second opinion from medical oncology who specifically subspecializes in lung cancers.  I have given her the option to either see Dr. Arbutus Ped in [facility] or medical oncology at [facility].  She will let us know which way she wants to proceed.  For now she would like to proceed with CT head, external iliac lymph node biopsy and discussion at tumor board next week.  I will call the patient after consensus from tumor board.    Thank you for this kind referral and the opportunity to participate in the care of this  Patient   Visit Diagnosis 1. Malignant neoplasm of unspecified part of unspecified bronchus or lung (HCC)   2. Malignant neoplasm of lower lobe, left bronchus or lung (HCC)   3. External iliac lymphadenopathy     Dr. Owens Shark, MD, MPH [facility] [phone] 10/12/2022

## 2022-10-13 ENCOUNTER — Encounter: Payer: Self-pay | Admitting: *Deleted

## 2022-10-13 DIAGNOSIS — C3432 Malignant neoplasm of lower lobe, left bronchus or lung: Secondary | ICD-10-CM

## 2022-10-13 NOTE — Progress Notes (Signed)
Received call from pt that she prefers to be referred to Dr. Shirline Frees for ongoing management of her newly diagnosed lung cancer. Referral has been placed. Will keep appts as scheduled for further workup while awaiting consultation with Dr. Shirline Frees.

## 2022-10-13 NOTE — Progress Notes (Signed)
Hannah Lack, MD sent to Hannah Yu PROCEDURE / BIOPSY REVIEW Date: 10/10/22  Requested Biopsy site: Left external iliac lymph node Reason for request: Confirmation of metastatic lung carcinoma Imaging review: Best seen on PET scan  Decision: Approved Imaging modality to perform: CT Schedule with: Moderate Sedation Schedule for: Any VIR    Please contact me with questions, concerns, or if issue pertaining to this request arise.  Reola Calkins, MD [facility]

## 2022-10-13 NOTE — Progress Notes (Signed)
Called pt to inform of biopsy scheduled for 6/18 at 830am. Instructed to arrive at 730am at the [facility] entrance. Pt instructed to take last dose of aspirin 81mg  on Fri 6/14 and to hold until after her biopsy. Instructed to restart aspirin on Wed 6/19. At the time of call, pt stated that would like to hold off on her referral to Dr. Shirline Frees at this time. Will try to cancel referral at this time. Instructed pt that will notify her with results once available and discuss how she would like to follow up at that time. Pt verbalized understanding. Nothing further needed at this time.

## 2022-10-13 NOTE — Addendum Note (Signed)
Addended by: Glory Buff on: 10/13/2022 10:03 AM   Modules accepted: Orders

## 2022-10-14 ENCOUNTER — Ambulatory Visit
Admission: RE | Admit: 2022-10-14 | Discharge: 2022-10-14 | Disposition: A | Discharge status: Home / Self Care | Payer: Medicare Other | Source: Ambulatory Visit | Attending: Oncology | Admitting: Oncology

## 2022-10-14 DIAGNOSIS — C3490 Malignant neoplasm of unspecified part of unspecified bronchus or lung: Secondary | ICD-10-CM | POA: Diagnosis not present

## 2022-10-14 DIAGNOSIS — C3432 Malignant neoplasm of lower lobe, left bronchus or lung: Secondary | ICD-10-CM | POA: Insufficient documentation

## 2022-10-14 MED ORDER — IOHEXOL 300 MG/ML  SOLN
75.0000 mL | Freq: Once | INTRAMUSCULAR | Status: AC | PRN
  Administered 2022-10-14: 60 mL via INTRAVENOUS

## 2022-10-16 ENCOUNTER — Encounter: Payer: Self-pay | Admitting: *Deleted

## 2022-10-16 ENCOUNTER — Encounter: Payer: Self-pay | Admitting: Oncology

## 2022-10-16 DIAGNOSIS — C3432 Malignant neoplasm of lower lobe, left bronchus or lung: Secondary | ICD-10-CM

## 2022-10-16 DIAGNOSIS — R590 Localized enlarged lymph nodes: Secondary | ICD-10-CM

## 2022-10-16 NOTE — Progress Notes (Signed)
After discussion at tumor board suggested definitive staging T3 versus T4.  Also it was thought that the external iliac group of lymph nodes on the left side were possibly hypermetabolic from an inflammatory component given that there were some inflammatory changes noted in the left hip.  I discussed all these findings with the patient after the tumor board.  We discussed could either forward with the biopsy watch close lymph node.  Patient prefers to wait and watch and not pursue with external iliac lymph node biopsy.  She also does not wish to proceed with a repeat bronchoscopy to sample the left upper lobe lung nodule.  Patient is not interested in thoracic surgery referral at this time.  We did discuss that her left lower lobe nodules were in close proximity to the aorta but I would still not rule her out as a surgical candidate.  Patient is agreeable to seeing radiation oncology for SBRT to her left lower lobe lung lesions.  Dr. Rushie Chestnut will discuss with her if he wants to empirically radiate the left upper lobe lung nodule versus continue to keep an eye on it and consideration for bronchoscopy versus empiric radiation showed the left upper lobe lung nodule increased in size.  I will see her with a repeat CT chest abdomen pelvis with contrast 3 months from now  Dr. Owens Shark, MD, MPH [facility] [phone] 10/16/2022 1:28 PM

## 2022-10-16 NOTE — Progress Notes (Signed)
Dr. Smith Robert spoke with patient about recommendations received from case conference. Per Dr. Smith Robert, pt wishes to proceed with radiation to the LLL mass and closely monitor other suspicious nodules in 3 months with CT scan chest, abdomen, pelvis with contrast. Orders placed and pt will be called with appts once scheduled.   Abdominal lymph node biopsy scheduled for 6/18 has been cancelled per pt request.

## 2022-10-20 ENCOUNTER — Encounter: Payer: Self-pay | Admitting: Radiation Oncology

## 2022-10-20 ENCOUNTER — Encounter: Payer: Self-pay | Admitting: *Deleted

## 2022-10-20 ENCOUNTER — Ambulatory Visit
Admission: RE | Admit: 2022-10-20 | Discharge: 2022-10-20 | Disposition: A | Discharge status: Home / Self Care | Payer: Medicare Other | Source: Ambulatory Visit | Attending: Radiation Oncology | Admitting: Radiation Oncology

## 2022-10-20 VITALS — BP 160/82 | HR 71 | Temp 98.5°F | Resp 16 | Ht 60.0 in | Wt 183.0 lb

## 2022-10-20 DIAGNOSIS — Z87891 Personal history of nicotine dependence: Secondary | ICD-10-CM | POA: Insufficient documentation

## 2022-10-20 DIAGNOSIS — Z7982 Long term (current) use of aspirin: Secondary | ICD-10-CM | POA: Insufficient documentation

## 2022-10-20 DIAGNOSIS — K219 Gastro-esophageal reflux disease without esophagitis: Secondary | ICD-10-CM | POA: Diagnosis not present

## 2022-10-20 DIAGNOSIS — Z86718 Personal history of other venous thrombosis and embolism: Secondary | ICD-10-CM | POA: Insufficient documentation

## 2022-10-20 DIAGNOSIS — Z79899 Other long term (current) drug therapy: Secondary | ICD-10-CM | POA: Insufficient documentation

## 2022-10-20 DIAGNOSIS — M129 Arthropathy, unspecified: Secondary | ICD-10-CM | POA: Diagnosis not present

## 2022-10-20 DIAGNOSIS — D649 Anemia, unspecified: Secondary | ICD-10-CM | POA: Insufficient documentation

## 2022-10-20 DIAGNOSIS — R918 Other nonspecific abnormal finding of lung field: Secondary | ICD-10-CM | POA: Diagnosis not present

## 2022-10-20 DIAGNOSIS — C3432 Malignant neoplasm of lower lobe, left bronchus or lung: Secondary | ICD-10-CM | POA: Diagnosis present

## 2022-10-20 DIAGNOSIS — I10 Essential (primary) hypertension: Secondary | ICD-10-CM | POA: Diagnosis not present

## 2022-10-20 LAB — POCT I-STAT CREATININE: Creatinine, Ser: 1.4 mg/dL — ABNORMAL HIGH (ref 0.44–1.00)

## 2022-10-20 NOTE — Progress Notes (Signed)
Met with patient during initial consult with Dr. Rushie Chestnut. All questions answered during visit. Reviewed upcoming appts with patient. Instructed to call with any questions or needs. Pt verbalized understanding. Nothing further needed at this time.

## 2022-10-20 NOTE — Consult Note (Signed)
NEW PATIENT EVALUATION  Name: Hannah Yu  MRN: [id number]  Date:   10/20/2022     DOB: 1945/10/11   This 77 y.o. 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   PREVIOUS INVESTIGATIONS:  PET/CT CT scans reviewed Cytology and pathology reports reviewed Clinical notes reviewed Case presented at tumor board  HPI: Patient is a 77 year old female who presented with shortness of breath back in April 24.  CT scan at that time showed a 2.7 x 2.1 cm mass in the posterior left lower lobe.  There is also a 1.3 x 1 cm nodular density in the lingula segment of the left upper lobe concerning for possible metastatic disease.  PET CT scan was performed showing hypermetabolic 3.5 cm medial left lower lobe lung mass suspicious for primary bronchogenic malignancy.  There was a more central hypermetabolic 0.9 cm left lower lobe pulmonary nodule suspicious for intrapulmonary metastasis or second primary.  There is also medial left upper lobe 1.0 cm pulmonary nodule with low-level FDG activity.  On review the medial left lower lobe lung mass appears to abut and possibly invade the aorta.  Bronchoscopy was performed only the lesion of the left lower lobe medial was biopsied and positive for non-small cell lung cancer.  Case was presented at tumor conference.  Patient has declined surgical opinion is now referred to radiation oncology for consideration of SBRT.  She is asymptomatic specifically [place] cough hemoptysis or chest tightness.  PLANNED TREATMENT REGIMEN: SBRT  PAST MEDICAL HISTORY:  has a past medical history of Acid reflux, Anemia, Anesthesia complication, Arthritis, Difficult intubation, DVT (deep  venous thrombosis) (HCC), Hypertension, and Sciatica.    PAST SURGICAL HISTORY:  Past Surgical History:  Procedure Laterality Date   ABDOMINAL HYSTERECTOMY     CATARACT EXTRACTION W/ INTRAOCULAR LENS IMPLANT Bilateral 2007   eyes done within 1 month of each other.   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   TOTAL HIP ARTHROPLASTY Right 09/06/2019   Procedure: RIGHT TOTAL HIP ARTHROPLASTY ANTERIOR APPROACH;  Surgeon: Kathryne Hitch, MD;  Location: [facility] OR;  Service: Orthopedics;  Laterality: Right;   TOTAL HIP ARTHROPLASTY Left 07/31/2020   Procedure: LEFT TOTAL HIP ARTHROPLASTY ANTERIOR APPROACH;  Surgeon: Kathryne Hitch, MD;  Location: [facility] OR;  Service: Orthopedics;  Laterality: Left;   VIDEO BRONCHOSCOPY WITH ENDOBRONCHIAL ULTRASOUND N/A 10/06/2022   Procedure: VIDEO BRONCHOSCOPY WITH ENDOBRONCHIAL ULTRASOUND;  Surgeon: Vida Rigger, MD;  Location: [facility] ORS;  Service: Thoracic;  Laterality: N/A;    FAMILY HISTORY: family history includes Breast cancer (age of onset: 47) in her sister.  SOCIAL HISTORY:  reports that she quit smoking about 29 years ago. Her smoking use included cigarettes. She has never used smokeless tobacco. She reports current alcohol use of about 4.0 standard drinks of alcohol per week. She reports that she does not use drugs.  ALLERGIES: Codeine, Cortisone, and Lovastatin  MEDICATIONS:  Current Outpatient Medications  Medication Sig Dispense Refill   amLODipine (NORVASC) 10 MG tablet Take 10 mg by mouth daily.     aspirin EC 81 MG tablet Take 162 mg by mouth daily. Swallow whole.  cholecalciferol (VITAMIN D3) 25 MCG (1000 UNIT) tablet Take 1,000 Units by mouth daily.     cyclobenzaprine (FLEXERIL) 5 MG tablet Take 5 mg by mouth 3 (three) times daily as needed for muscle spasms.     FIBER PO Take 1 tablet by mouth daily.     furosemide (LASIX) 40 MG tablet Take 40 mg by mouth daily.      ibuprofen (ADVIL) 200 MG tablet Take 400 mg by mouth every 6 (six) hours as needed for mild pain or moderate pain.     Ibuprofen-diphenhydrAMINE HCl (IBUPROFEN PM) 200-25 MG CAPS Take 2 tablets by mouth at bedtime as needed (sleep).     losartan (COZAAR) 100 MG tablet Take 100 mg by mouth daily.     magnesium oxide (MAG-OX) 400 (240 Mg) MG tablet Take 400 mg by mouth daily.     metoprolol succinate (TOPROL-XL) 50 MG 24 hr tablet Take 50 mg by mouth daily.     omeprazole (PRILOSEC) 40 MG capsule Take 40 mg by mouth daily as needed.     OVER THE COUNTER MEDICATION Take 1 capsule by mouth daily. CBD gummie     Tetrahydrozoline HCl (VISINE OP) Place 1 drop into both eyes daily as needed (dry eyes).     vitamin B-12 (CYANOCOBALAMIN) 1000 MCG tablet Take 1,000 mcg by mouth daily.     No current facility-administered medications for this encounter.    ECOG PERFORMANCE STATUS:  0 - Asymptomatic  REVIEW OF SYSTEMS: Patient denies any weight loss, fatigue, weakness, fever, chills or night sweats. Patient denies any loss of vision, blurred vision. Patient denies any ringing  of the ears or hearing loss. No irregular heartbeat. Patient denies heart murmur or history of fainting. Patient denies any chest pain or pain radiating to her upper extremities. Patient denies any shortness of breath, difficulty breathing at night, cough or hemoptysis. Patient denies any swelling in the lower legs. Patient denies any nausea vomiting, vomiting of blood, or coffee ground material in the vomitus. Patient denies any stomach pain. Patient states has had normal bowel movements no significant constipation or diarrhea. Patient denies any dysuria, hematuria or significant nocturia. Patient denies any problems walking, swelling in the joints or loss of balance. Patient denies any skin changes, loss of hair or loss of weight. Patient denies any excessive worrying or anxiety or significant depression. Patient denies any problems  with insomnia. Patient denies excessive thirst, polyuria, polydipsia. Patient denies any swollen glands, patient denies easy bruising or easy bleeding. Patient denies any recent infections, allergies or URI. Patient "s visual fields have not changed significantly in recent time.   PHYSICAL EXAM: BP (!) 160/82   Pulse 71   Temp 98.5 F (36.9 C) (Tympanic)   Resp 16   Ht 5' (1.524 m)   Wt 183 lb (83 kg)   BMI 35.74 kg/m  Well-developed well-nourished patient in NAD. HEENT reveals PERLA, EOMI, discs not visualized.  Oral cavity is clear. No oral mucosal lesions are identified. Neck is clear without evidence of cervical or supraclavicular adenopathy. Lungs are clear to A&P. Cardiac examination is essentially unremarkable with regular rate and rhythm without murmur rub or thrill. Abdomen is benign with no organomegaly or masses noted. Motor sensory and DTR levels are equal and symmetric in the upper and lower extremities. Cranial nerves II through XII are grossly intact. Proprioception is intact. No peripheral adenopathy or edema is identified. No motor or sensory levels are noted. Crude visual fields are within normal  range.  LABORATORY DATA: Pathology reports reviewed cytology report reviewed    RADIOLOGY RESULTS: CT scan and PET CT scan reviewed compatible with above-stated findings.   IMPRESSION: At this time would like to go after the 3.5 cm left lower lobe hypermetabolic lesion with SBRT.  Based on his close proximity to the aorta we will plan on delivering 50 Gray in 5 fractions.  Will use motion restriction for dimensional treatment planning.  Risks and benefits of treatment including extremely low side effect profile for SBRT was reviewed with the patient.  She seems to comprehend my recommendations well.  Will continue to watch the other areas of concern and possibly all for SBRT in the future should those areas progress.  I would like to take this opportunity to thank you for allowing me  to participate in the care of your patient.Marland Kitchen  PLAN: As above  Carmina Miller, MD

## 2022-10-21 ENCOUNTER — Ambulatory Visit
Admission: RE | Admit: 2022-10-21 | Discharge: 2022-10-21 | Disposition: A | Discharge status: Home / Self Care | Payer: Medicare Other | Source: Ambulatory Visit | Attending: Oncology | Admitting: Oncology

## 2022-10-21 ENCOUNTER — Institutional Professional Consult (permissible substitution): Payer: Medicare Other | Admitting: Radiation Oncology

## 2022-10-22 ENCOUNTER — Encounter: Payer: Self-pay | Admitting: *Deleted

## 2022-10-22 ENCOUNTER — Ambulatory Visit
Admission: RE | Admit: 2022-10-22 | Discharge: 2022-10-22 | Disposition: A | Discharge status: Home / Self Care | Payer: Medicare Other | Source: Ambulatory Visit | Attending: Radiation Oncology | Admitting: Radiation Oncology

## 2022-10-22 DIAGNOSIS — C3432 Malignant neoplasm of lower lobe, left bronchus or lung: Secondary | ICD-10-CM | POA: Insufficient documentation

## 2022-10-22 DIAGNOSIS — Z87891 Personal history of nicotine dependence: Secondary | ICD-10-CM | POA: Insufficient documentation

## 2022-10-23 DIAGNOSIS — C3432 Malignant neoplasm of lower lobe, left bronchus or lung: Secondary | ICD-10-CM | POA: Diagnosis not present

## 2022-10-27 NOTE — Anesthesia Postprocedure Evaluation (Signed)
Anesthesia Post Note  Patient: Hannah Yu  Procedure(s) Performed: VIDEO BRONCHOSCOPY WITH ENDOBRONCHIAL ULTRASOUND ROBOTIC ASSISTED NAVIGATIONAL BRONCHOSCOPY  Patient location during evaluation: PACU Anesthesia Type: General Level of consciousness: awake and alert Pain management: pain level controlled Vital Signs Assessment: post-procedure vital signs reviewed and stable Respiratory status: spontaneous breathing, nonlabored ventilation, respiratory function stable and patient connected to nasal cannula oxygen Cardiovascular status: blood pressure returned to baseline and stable Postop Assessment: no apparent nausea or vomiting Anesthetic complications: yes   Encounter Notable Events  Notable Event Outcome Phase Comment  Difficult to intubate - expected  Intraprocedure Filed from anesthesia note documentation.     Last Vitals:  Vitals:   10/06/22 1700 10/06/22 1722  BP: 132/70   Pulse: 73   Resp: 17   Temp: (!) 36.4 C   SpO2: 94% 93%    Last Pain:  Vitals:   10/07/22 0822  TempSrc:   PainSc: 0-No pain                 Lenard Simmer

## 2022-11-04 ENCOUNTER — Other Ambulatory Visit: Payer: Self-pay

## 2022-11-04 ENCOUNTER — Encounter: Payer: Self-pay | Admitting: *Deleted

## 2022-11-04 ENCOUNTER — Ambulatory Visit
Admission: RE | Admit: 2022-11-04 | Discharge: 2022-11-04 | Disposition: A | Discharge status: Home / Self Care | Payer: Medicare Other | Source: Ambulatory Visit | Attending: Radiation Oncology | Admitting: Radiation Oncology

## 2022-11-04 DIAGNOSIS — C3432 Malignant neoplasm of lower lobe, left bronchus or lung: Secondary | ICD-10-CM | POA: Insufficient documentation

## 2022-11-04 DIAGNOSIS — Z87891 Personal history of nicotine dependence: Secondary | ICD-10-CM | POA: Diagnosis not present

## 2022-11-04 LAB — RAD ONC ARIA SESSION SUMMARY
Course Elapsed Days: 0
Plan Fractions Treated to Date: 1
Plan Prescribed Dose Per Fraction: 10 Gy
Plan Total Fractions Prescribed: 5
Plan Total Prescribed Dose: 50 Gy
Reference Point Dosage Given to Date: 10 Gy
Reference Point Session Dosage Given: 10 Gy
Session Number: 1

## 2022-11-07 ENCOUNTER — Other Ambulatory Visit: Payer: Self-pay

## 2022-11-07 ENCOUNTER — Ambulatory Visit
Admission: RE | Admit: 2022-11-07 | Discharge: 2022-11-07 | Disposition: A | Discharge status: Home / Self Care | Payer: Medicare Other | Source: Ambulatory Visit | Attending: Radiation Oncology | Admitting: Radiation Oncology

## 2022-11-07 DIAGNOSIS — C3432 Malignant neoplasm of lower lobe, left bronchus or lung: Secondary | ICD-10-CM | POA: Diagnosis not present

## 2022-11-07 LAB — RAD ONC ARIA SESSION SUMMARY
Course Elapsed Days: 3
Plan Fractions Treated to Date: 2
Plan Prescribed Dose Per Fraction: 10 Gy
Plan Total Fractions Prescribed: 5
Plan Total Prescribed Dose: 50 Gy
Reference Point Dosage Given to Date: 20 Gy
Reference Point Session Dosage Given: 10 Gy
Session Number: 2

## 2022-11-11 ENCOUNTER — Ambulatory Visit
Admission: RE | Admit: 2022-11-11 | Discharge: 2022-11-11 | Disposition: A | Discharge status: Home / Self Care | Payer: Medicare Other | Source: Ambulatory Visit | Attending: Radiation Oncology | Admitting: Radiation Oncology

## 2022-11-11 ENCOUNTER — Other Ambulatory Visit: Payer: Self-pay

## 2022-11-11 DIAGNOSIS — C3432 Malignant neoplasm of lower lobe, left bronchus or lung: Secondary | ICD-10-CM | POA: Diagnosis not present

## 2022-11-11 LAB — RAD ONC ARIA SESSION SUMMARY
Course Elapsed Days: 7
Plan Fractions Treated to Date: 3
Plan Prescribed Dose Per Fraction: 10 Gy
Plan Total Fractions Prescribed: 5
Plan Total Prescribed Dose: 50 Gy
Reference Point Dosage Given to Date: 30 Gy
Reference Point Session Dosage Given: 10 Gy
Session Number: 3

## 2022-11-13 ENCOUNTER — Ambulatory Visit
Admission: RE | Admit: 2022-11-13 | Discharge: 2022-11-13 | Disposition: A | Discharge status: Home / Self Care | Payer: Medicare Other | Source: Ambulatory Visit | Attending: Radiation Oncology | Admitting: Radiation Oncology

## 2022-11-13 ENCOUNTER — Other Ambulatory Visit: Payer: Self-pay

## 2022-11-13 DIAGNOSIS — C3432 Malignant neoplasm of lower lobe, left bronchus or lung: Secondary | ICD-10-CM | POA: Diagnosis not present

## 2022-11-13 LAB — RAD ONC ARIA SESSION SUMMARY
Course Elapsed Days: 9
Plan Fractions Treated to Date: 4
Plan Prescribed Dose Per Fraction: 10 Gy
Plan Total Fractions Prescribed: 5
Plan Total Prescribed Dose: 50 Gy
Reference Point Dosage Given to Date: 40 Gy
Reference Point Session Dosage Given: 10 Gy
Session Number: 4

## 2022-11-18 ENCOUNTER — Ambulatory Visit
Admission: RE | Admit: 2022-11-18 | Discharge: 2022-11-18 | Disposition: A | Discharge status: Home / Self Care | Payer: Medicare Other | Source: Ambulatory Visit | Attending: Radiation Oncology | Admitting: Radiation Oncology

## 2022-11-18 ENCOUNTER — Other Ambulatory Visit: Payer: Self-pay

## 2022-11-18 DIAGNOSIS — C3432 Malignant neoplasm of lower lobe, left bronchus or lung: Secondary | ICD-10-CM | POA: Diagnosis not present

## 2022-11-18 LAB — RAD ONC ARIA SESSION SUMMARY
Course Elapsed Days: 14
Plan Fractions Treated to Date: 5
Plan Prescribed Dose Per Fraction: 10 Gy
Plan Total Fractions Prescribed: 5
Plan Total Prescribed Dose: 50 Gy
Reference Point Dosage Given to Date: 50 Gy
Reference Point Session Dosage Given: 10 Gy
Session Number: 5

## 2022-12-31 ENCOUNTER — Encounter: Payer: Self-pay | Admitting: Radiation Oncology

## 2022-12-31 ENCOUNTER — Ambulatory Visit
Admission: RE | Admit: 2022-12-31 | Discharge: 2022-12-31 | Disposition: A | Discharge status: Home / Self Care | Payer: Medicare Other | Source: Ambulatory Visit | Attending: Radiation Oncology | Admitting: Radiation Oncology

## 2022-12-31 VITALS — BP 140/79 | HR 92 | Temp 97.6°F | Resp 16 | Ht 60.0 in | Wt 183.6 lb

## 2022-12-31 DIAGNOSIS — C3432 Malignant neoplasm of lower lobe, left bronchus or lung: Secondary | ICD-10-CM | POA: Diagnosis present

## 2022-12-31 NOTE — Progress Notes (Signed)
Radiation Oncology Follow up Note  Name: Hannah Yu   Date:   12/31/2022 MRN:  [id number] DOB: [birth year]    This 77 y.o. female presents to the clinic today for 1 month follow-up status post SBRT for non-small cell lung cancer of the left lung left upper lobe.  REFERRING PROVIDER: Mick Sell, MD  HPI: Patient is a 77 year old female now out 1 month after completed SBRT to her left lower lobe for a empiric treatment to a 3.5 cm left lower lobe lesion.  Seen today in follow-up she is doing well.  She has developed a mild cough which we expect at this time no dysphagia any change in her pulmonary status or hemoptysis..  COMPLICATIONS OF TREATMENT: none  FOLLOW UP COMPLIANCE: keeps appointments   PHYSICAL EXAM:  BP (!) 140/79   Pulse 92   Temp 97.6 F (36.4 C) (Tympanic)   Resp 16   Ht 5' (1.524 m)   Wt 183 lb 9.6 oz (83.3 kg)   BMI 35.86 kg/m  Well-developed well-nourished patient in NAD. HEENT reveals PERLA, EOMI, discs not visualized.  Oral cavity is clear. No oral mucosal lesions are identified. Neck is clear without evidence of cervical or supraclavicular adenopathy. Lungs are clear to A&P. Cardiac examination is essentially unremarkable with regular rate and rhythm without murmur rub or thrill. Abdomen is benign with no organomegaly or masses noted. Motor sensory and DTR levels are equal and symmetric in the upper and lower extremities. Cranial nerves II through XII are grossly intact. Proprioception is intact. No peripheral adenopathy or edema is identified. No motor or sensory levels are noted. Crude visual fields are within normal range.  RADIOLOGY RESULTS: Dr. Smith Robert has ordered CT scan in September chest abdomen pelvis which I will review when available  PLAN: This time of asked to see her back in 3 months depending on her September CT scan may order additional imaging down the road.  Otherwise she clinically is doing well with very low side effect profile.   Patient continues close follow-up care with medical oncology.  I would like to take this opportunity to thank you for allowing me to participate in the care of your patient.Carmina Miller, MD

## 2023-01-15 ENCOUNTER — Ambulatory Visit
Admission: RE | Admit: 2023-01-15 | Discharge: 2023-01-15 | Disposition: A | Discharge status: Home / Self Care | Payer: Medicare Other | Source: Ambulatory Visit | Attending: Oncology | Admitting: Oncology

## 2023-01-15 DIAGNOSIS — R590 Localized enlarged lymph nodes: Secondary | ICD-10-CM | POA: Diagnosis present

## 2023-01-15 DIAGNOSIS — C3432 Malignant neoplasm of lower lobe, left bronchus or lung: Secondary | ICD-10-CM | POA: Insufficient documentation

## 2023-01-15 LAB — POCT I-STAT CREATININE: Creatinine, Ser: 0.8 mg/dL (ref 0.44–1.00)

## 2023-01-15 MED ORDER — IOHEXOL 300 MG/ML  SOLN
100.0000 mL | Freq: Once | INTRAMUSCULAR | Status: AC | PRN
  Administered 2023-01-15: 100 mL via INTRAVENOUS

## 2023-01-15 NOTE — Progress Notes (Signed)
IR PA called to CT after IV contrast extravasation to the LUE, CT technologist states approximately 90 cc of Omnipaque 300 was infiltrated. IV access was removed, manual expression of contrast was performed with minimal return of fluid. A sterile pressure dressing was applied to the site. Instructions given to the patient and CT technologist for affected extremity compression, elevation and ice pack application. The patient denies any pain at the site, denies any loss of sensation or loss of strength. She denies any temperature changes.   On exam: LUE with sensation intact, 5/5 strength, without significant temperature change, minimal swelling noted without ecchymosis, erythema, ulceration or skin breakdown.  Patient given instructions for discharge on when to present to the ER including, but not limited to change in LUE strength, sensation, skin temperature changes or changes to skin color or skin breakdown. She verbalizes understanding of the plan today.   Berneta Levins, PA-C 01/15/2023, 3:55 PM

## 2023-01-26 ENCOUNTER — Inpatient Hospital Stay: Payer: Medicare Other | Attending: Oncology | Admitting: Oncology

## 2023-01-26 ENCOUNTER — Encounter: Payer: Self-pay | Admitting: Oncology

## 2023-01-26 VITALS — BP 138/76 | HR 66 | Temp 99.0°F | Resp 16 | Ht 60.0 in | Wt 182.0 lb

## 2023-01-26 DIAGNOSIS — Z79899 Other long term (current) drug therapy: Secondary | ICD-10-CM | POA: Insufficient documentation

## 2023-01-26 DIAGNOSIS — R599 Enlarged lymph nodes, unspecified: Secondary | ICD-10-CM | POA: Insufficient documentation

## 2023-01-26 DIAGNOSIS — Z87891 Personal history of nicotine dependence: Secondary | ICD-10-CM | POA: Insufficient documentation

## 2023-01-26 DIAGNOSIS — C3432 Malignant neoplasm of lower lobe, left bronchus or lung: Secondary | ICD-10-CM | POA: Insufficient documentation

## 2023-01-26 NOTE — Progress Notes (Unsigned)
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  She was formally seen by another practitioner and requested a transfer.  She currently feels well and is asymptomatic.  She has no neurologic complaints.  She denies any recent fevers.  She has good appetite and denies weight loss.  She has no chest pain, shortness of breath, cough, or hemoptysis.  She denies any nausea, vomiting, constipation, or diarrhea.  She has no urinary complaints.  Patient offers no specific complaints today.  REVIEW OF SYSTEMS:   Review of Systems  Constitutional: Negative.  Negative for fever, malaise/fatigue and weight loss.  Respiratory: Negative.  Negative for cough, hemoptysis and shortness of breath.   Cardiovascular: Negative.  Negative for chest pain and leg swelling.  Gastrointestinal: Negative.  Negative for abdominal pain.  Genitourinary: Negative.  Negative for dysuria.  Musculoskeletal: Negative.  Negative for back pain.  Skin: Negative.  Negative for rash.  Neurological: Negative.  Negative for dizziness, focal weakness, weakness and headaches.  Psychiatric/Behavioral: Negative.  The patient is not nervous/anxious.     As per HPI. Otherwise, a complete review of systems is negative.  PAST MEDICAL HISTORY: Past Medical History:  Diagnosis Date   Acid reflux    Anemia    Anesthesia complication    Tachycardia previously, now on metoprolol   Arthritis    09/02/2019: per patient "have it real bad in both hands and back"   Difficult intubation    had to terminate intubation unable to advance tube for  cholecystectomy 2017?   DVT (deep venous thrombosis) (HCC)    leg   Hypertension    Sciatica    09/02/2019: has had it for about 3 years    PAST SURGICAL HISTORY: Past Surgical History:  Procedure Laterality Date   ABDOMINAL HYSTERECTOMY     CATARACT EXTRACTION W/ INTRAOCULAR LENS IMPLANT Bilateral 2007   eyes done within 1 month of each other.   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   TOTAL HIP ARTHROPLASTY Right 09/06/2019   Procedure: RIGHT TOTAL HIP ARTHROPLASTY ANTERIOR APPROACH;  Surgeon: Kathryne Hitch, MD;  Location: [facility] OR;  Service: Orthopedics;  Laterality: Right;   TOTAL HIP ARTHROPLASTY Left 07/31/2020   Procedure: LEFT TOTAL HIP ARTHROPLASTY ANTERIOR APPROACH;  Surgeon: Kathryne Hitch, MD;  Location: [facility] OR;  Service: Orthopedics;  Laterality: Left;   VIDEO BRONCHOSCOPY WITH ENDOBRONCHIAL ULTRASOUND N/A 10/06/2022   Procedure: VIDEO BRONCHOSCOPY WITH ENDOBRONCHIAL ULTRASOUND;  Surgeon: Vida Rigger, MD;  Location: [facility] ORS;  Service: Thoracic;  Laterality: N/A;    FAMILY HISTORY: Family History  Problem Relation Age of Onset   Breast cancer Sister 81    ADVANCED DIRECTIVES (Y/N):  N  HEALTH MAINTENANCE: Social History   Tobacco Use   Smoking status: Former    Current packs/day: 0.00    Types: Cigarettes    Quit date: 01/02/1993    Years since quitting: 30.0   Smokeless tobacco: Never  Vaping Use   Vaping status: Never Used  Substance Use Topics   Alcohol use: Yes    Alcohol/week: 4.0 standard drinks of  alcohol    Types: 4 Glasses of wine per week    Comment: 09/02/2019: per patient "ever so often"   Drug use: No     Colonoscopy:  PAP:  Bone density:  Lipid panel:  Allergies  Allergen Reactions   Ezetimibe Other (See Comments)    Body aches, and stiffness   Codeine Diarrhea   Cortisone Swelling    injections   Lovastatin Other (See Comments)    Muscle cramps     Current Outpatient Medications  Medication Sig Dispense Refill   amLODipine (NORVASC) 10 MG tablet Take 10 mg by mouth daily.     aspirin EC 81 MG tablet Take 162 mg by mouth daily. Swallow whole.     cholecalciferol (VITAMIN D3) 25 MCG (1000 UNIT) tablet Take 1,000 Units by mouth daily.     cyclobenzaprine (FLEXERIL) 5 MG tablet Take 5 mg by mouth 3 (three) times daily as needed for muscle spasms.     FIBER PO Take 1 tablet by mouth daily.     furosemide (LASIX) 40 MG tablet Take 40 mg by mouth daily.     ibuprofen (ADVIL) 200 MG tablet Take 400 mg by mouth every 6 (six) hours as needed for mild pain or moderate pain.     Ibuprofen-diphenhydrAMINE HCl (IBUPROFEN PM) 200-25 MG CAPS Take 2 tablets by mouth at bedtime as needed (sleep).     losartan (COZAAR) 100 MG tablet Take 100 mg by mouth daily.     magnesium oxide (MAG-OX) 400 (240 Mg) MG tablet Take 400 mg by mouth daily.     metoprolol succinate (TOPROL-XL) 50 MG 24 hr tablet Take 50 mg by mouth daily.     omeprazole (PRILOSEC) 40 MG capsule Take 40 mg by mouth daily as needed.     OVER THE COUNTER MEDICATION Take 1 capsule by mouth daily. CBD gummie     Tetrahydrozoline HCl (VISINE OP) Place 1 drop into both eyes daily as needed (dry eyes).     vitamin B-12 (CYANOCOBALAMIN) 1000 MCG tablet Take 1,000 mcg by mouth daily.     No current facility-administered medications for this visit.    OBJECTIVE: Vitals:   01/26/23 1307  BP: 138/76  Pulse: 66  Resp: 16  Temp: 99 F (37.2 C)  SpO2: 98%     Body mass index is 35.54 kg/m.    ECOG FS:0 - Asymptomatic  General: Well-developed, well-nourished, no acute distress. Eyes: Pink conjunctiva, anicteric sclera. HEENT: Normocephalic, moist mucous membranes. Lungs: No audible wheezing or coughing. Heart: Regular rate and rhythm. Abdomen: Soft, nontender, no obvious distention. Musculoskeletal: No edema, cyanosis, or clubbing. Neuro: Alert, answering all questions appropriately.  Cranial nerves grossly intact. Skin: No rashes or petechiae noted. Psych: Normal affect. Lymphatics: No cervical, calvicular, axillary or inguinal LAD.   LAB RESULTS:  Lab Results  Component Value Date   NA 134 (L) 08/01/2020   K 3.7 08/01/2020   CL 100 08/01/2020   CO2 28 08/01/2020   GLUCOSE 119 (H) 08/01/2020   BUN 7 (L) 08/01/2020   CREATININE 0.80 01/15/2023   CALCIUM 8.8 (L) 08/01/2020   GFRNONAA >60 08/01/2020   GFRAA >60 09/10/2019    Lab Results  Component Value Date   WBC 11.0 (H) 08/01/2020   HGB 11.6 (L) 08/01/2020   HCT 36.9 08/01/2020   MCV 83.5 08/01/2020   PLT 330 08/01/2020     STUDIES: CT CHEST ABDOMEN PELVIS W CONTRAST  Result Date: 01/26/2023 CLINICAL DATA:  Metastatic disease  evaluation, non-small-cell lung cancer * Tracking Code: BO * EXAM: CT CHEST, ABDOMEN, AND PELVIS WITH CONTRAST TECHNIQUE: Multidetector CT imaging of the chest, abdomen and pelvis was performed following the standard protocol during bolus administration of intravenous contrast. RADIATION DOSE REDUCTION: This exam was performed according to the departmental dose-optimization program which includes automated exposure control, adjustment of the mA and/or kV according to patient size and/or use of iterative reconstruction technique. CONTRAST:  OMNIPAQUE IOHEXOL 300 MG/ML SOLN additional oral enteric contrast COMPARISON:  CT chest, 10/03/2022, PET-CT, 09/30/2022 FINDINGS: CT CHEST FINDINGS Cardiovascular: Aortic atherosclerosis. Normal heart size. Left coronary artery calcifications. No pericardial effusion. Mediastinum/Nodes: New prevascular lymph node or pleural soft tissue nodule measuring 1.3 x 0.8 cm (series 2, image 29). No other enlarged mediastinal, hilar, or axillary lymph nodes. Thyroid gland, trachea, and esophagus demonstrate no significant findings. Lungs/Pleura: Diminished size of a paramedian nodule of the superior segment left lower lobe, measuring 2.6 x 1.4 cm, previously  3.5 x 1.9 cm (series 6, image 73). No significant change in a nodule of the medial left upper lobe measuring 1.1 x 0.9 cm, previously 1.1 x 1.0 cm (series 6, image 63). Bibasilar scarring or atelectasis. No pleural effusion or pneumothorax. Musculoskeletal: No chest wall abnormality. No acute osseous findings. CT ABDOMEN PELVIS FINDINGS Hepatobiliary: No solid liver abnormality is seen. Gallstone near the gallbladder neck. No gallbladder wall thickening, or biliary dilatation. Pancreas: Unremarkable. No pancreatic ductal dilatation or surrounding inflammatory changes. Spleen: Normal in size without significant abnormality. Adrenals/Urinary Tract: Adrenal glands are unremarkable. Multiple small nonobstructive left renal calculi. Lobulated cortical scarring, particularly of the inferior pole, sequelae of prior obstructive or infectious insult. No right-sided calculi. No hydronephrosis. Evaluation of the bladder very limited by dense metallic streak artifact from bilateral hip arthroplasty. No obvious abnormality. Stomach/Bowel: Stomach is within normal limits. Appendix appears normal. No evidence of bowel wall thickening, distention, or inflammatory changes. Pancolonic diverticulosis. Vascular/Lymphatic: Aortic atherosclerosis. Unchanged low left para-aortic lymph node measuring 1.2 x 0.9 cm (series 2, image 80). Unchanged enlarged left external iliac node measuring 2.2 x 1.5 cm (series 2, image 106) Reproductive: Status post hysterectomy. Other: No abdominal wall hernia or abnormality. No ascites. Musculoskeletal: No acute osseous findings. Status post bilateral hip total arthroplasty. IMPRESSION: 1. Diminished size of a paramedian nodule of the superior segment left lower lobe, consistent with treatment response 2. No significant change in a nodule of the medial left upper lobe measuring 1.1 x 0.9 cm, previously 1.1 x 1.0 cm. 3. New prevascular lymph node or pleural soft tissue nodule measuring 1.3 x 0.8 cm  concerning for nodal or pleural metastasis. PET-CT could be used to assess for metabolic activity. 4. Unchanged low left para-aortic and left external iliac lymph nodes, which are nonspecific but remain suspicious for nodal metastases. 5. No other evidence of metastatic disease in the chest, abdomen, or pelvis. 6. Cholelithiasis. 7. Nonobstructive left nephrolithiasis. 8. Pancolonic diverticulosis. 9. Coronary artery disease. Aortic Atherosclerosis (ICD10-I70.0). Electronically Signed   By: Jearld Lesch M.D.   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  CT scan results from January 26, 2023 reviewed independently and report as above with diminished size of nodule suggesting treatment response.  There is no change in her in the left upper lobe nodule.  The minimally PET  positive lymphadenopathy in her abdomen pelvis is also unchanged in size.  Patient noted to have a new prevascular lymph node or pleural soft tissue nodule measuring 1.3 cm, but no other lymphadenopathy in her chest.  Will get a PET scan in the next 1 to 2 weeks to further evaluate.  Patient will return to clinic 1 week after the PET scan to discuss the results and any additional diagnostic testing necessary.  I spent a total of 30 minutes reviewing chart data, face-to-face evaluation with the patient, counseling and coordination of care as detailed above.   Patient expressed understanding and was in agreement with this plan. She also understands that She can call clinic at any time with any questions, concerns, or complaints.    Cancer Staging  No matching staging information was found for the patient.   Jeralyn Ruths, MD   01/27/2023 8:49 AM

## 2023-02-06 ENCOUNTER — Ambulatory Visit
Admission: RE | Admit: 2023-02-06 | Discharge: 2023-02-06 | Disposition: A | Discharge status: Home / Self Care | Payer: Medicare Other | Source: Ambulatory Visit | Attending: Oncology | Admitting: Oncology

## 2023-02-06 DIAGNOSIS — C3432 Malignant neoplasm of lower lobe, left bronchus or lung: Secondary | ICD-10-CM

## 2023-02-06 LAB — GLUCOSE, CAPILLARY: Glucose-Capillary: 112 mg/dL — ABNORMAL HIGH (ref 70–99)

## 2023-02-06 MED ORDER — FLUDEOXYGLUCOSE F - 18 (FDG) INJECTION
9.9600 | Freq: Once | INTRAVENOUS | Status: AC | PRN
  Administered 2023-02-06: 9.96 via INTRAVENOUS

## 2023-02-13 ENCOUNTER — Inpatient Hospital Stay: Payer: Medicare Other | Attending: Oncology | Admitting: Oncology

## 2023-02-13 ENCOUNTER — Encounter: Payer: Self-pay | Admitting: Oncology

## 2023-02-13 VITALS — BP 141/91 | HR 113 | Temp 98.1°F | Resp 18 | Ht 60.0 in | Wt 184.0 lb

## 2023-02-13 DIAGNOSIS — Z803 Family history of malignant neoplasm of breast: Secondary | ICD-10-CM | POA: Diagnosis not present

## 2023-02-13 DIAGNOSIS — Z87891 Personal history of nicotine dependence: Secondary | ICD-10-CM | POA: Insufficient documentation

## 2023-02-13 DIAGNOSIS — C3432 Malignant neoplasm of lower lobe, left bronchus or lung: Secondary | ICD-10-CM | POA: Insufficient documentation

## 2023-02-13 DIAGNOSIS — Z79899 Other long term (current) drug therapy: Secondary | ICD-10-CM | POA: Diagnosis not present

## 2023-02-13 NOTE — Progress Notes (Signed)
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  She continues to feel well and remains asymptomatic.  She has no neurologic complaints.  She denies any recent fevers.  She has a good appetite and denies weight loss.  She has no chest pain, shortness of breath, cough, or hemoptysis.  She denies any nausea, vomiting, constipation, or diarrhea.  She has no urinary complaints.  Patient offers no specific complaints today.    REVIEW OF SYSTEMS:   Review of Systems  Constitutional: Negative.  Negative for fever, malaise/fatigue and weight loss.  Respiratory: Negative.  Negative for cough, hemoptysis and shortness of breath.   Cardiovascular: Negative.  Negative for chest pain and leg swelling.  Gastrointestinal: Negative.  Negative for abdominal pain.  Genitourinary: Negative.  Negative for dysuria.  Musculoskeletal: Negative.  Negative for back pain.  Skin: Negative.  Negative for rash.  Neurological: Negative.  Negative for dizziness, focal weakness, weakness and headaches.  Psychiatric/Behavioral: Negative.  The patient is not nervous/anxious.     As per HPI. Otherwise, a complete review of systems is negative.  PAST MEDICAL HISTORY: Past Medical History:  Diagnosis Date   Acid reflux    Anemia    Anesthesia complication    Tachycardia previously, now on metoprolol   Arthritis    09/02/2019: per patient "have it real bad in both hands and back"   Difficult intubation    had to terminate intubation unable to advance tube for cholecystectomy 2017?   DVT (deep venous thrombosis) (HCC)     leg   Hypertension    Sciatica    09/02/2019: has had it for about 3 years    PAST SURGICAL HISTORY: Past Surgical History:  Procedure Laterality Date   ABDOMINAL HYSTERECTOMY     CATARACT EXTRACTION W/ INTRAOCULAR LENS IMPLANT Bilateral 2007   eyes done within 1 month of each other.   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   TOTAL HIP ARTHROPLASTY Right 09/06/2019   Procedure: RIGHT TOTAL HIP ARTHROPLASTY ANTERIOR APPROACH;  Surgeon: Kathryne Hitch, MD;  Location: [facility] OR;  Service: Orthopedics;  Laterality: Right;   TOTAL HIP ARTHROPLASTY Left 07/31/2020   Procedure: LEFT TOTAL HIP ARTHROPLASTY ANTERIOR APPROACH;  Surgeon: Kathryne Hitch, MD;  Location: [facility] OR;  Service: Orthopedics;  Laterality: Left;   VIDEO BRONCHOSCOPY WITH ENDOBRONCHIAL ULTRASOUND N/A 10/06/2022   Procedure: VIDEO BRONCHOSCOPY WITH ENDOBRONCHIAL ULTRASOUND;  Surgeon: Vida Rigger, MD;  Location: [facility] ORS;  Service: Thoracic;  Laterality: N/A;    FAMILY HISTORY: Family History  Problem Relation Age of Onset   Breast cancer Sister 62    ADVANCED DIRECTIVES (Y/N):  N  HEALTH MAINTENANCE: Social History   Tobacco Use   Smoking status: Former    Current packs/day: 0.00    Types: Cigarettes    Quit date: 01/02/1993    Years since quitting: 30.1   Smokeless tobacco: Never  Vaping Use   Vaping status: Never Used  Substance Use Topics   Alcohol use: Yes    Alcohol/week: 4.0 standard drinks of alcohol    Types: 4 Glasses  of wine per week    Comment: 09/02/2019: per patient "ever so often"   Drug use: No     Colonoscopy:  PAP:  Bone density:  Lipid panel:  Allergies  Allergen Reactions   Ezetimibe Other (See Comments)    Body aches, and stiffness   Codeine Diarrhea   Cortisone Swelling    injections   Lovastatin Other (See Comments)    Muscle cramps    Current Outpatient Medications  Medication Sig Dispense  Refill   amLODipine (NORVASC) 10 MG tablet Take 10 mg by mouth daily.     aspirin EC 81 MG tablet Take 162 mg by mouth daily. Swallow whole.     cholecalciferol (VITAMIN D3) 25 MCG (1000 UNIT) tablet Take 1,000 Units by mouth daily.     cyclobenzaprine (FLEXERIL) 5 MG tablet Take 5 mg by mouth 3 (three) times daily as needed for muscle spasms.     FIBER PO Take 1 tablet by mouth daily.     furosemide (LASIX) 40 MG tablet Take 40 mg by mouth daily.     ibuprofen (ADVIL) 200 MG tablet Take 400 mg by mouth every 6 (six) hours as needed for mild pain or moderate pain.     Ibuprofen-diphenhydrAMINE HCl (IBUPROFEN PM) 200-25 MG CAPS Take 2 tablets by mouth at bedtime as needed (sleep).     losartan (COZAAR) 100 MG tablet Take 100 mg by mouth daily.     magnesium oxide (MAG-OX) 400 (240 Mg) MG tablet Take 400 mg by mouth daily.     metoprolol succinate (TOPROL-XL) 50 MG 24 hr tablet Take 50 mg by mouth daily.     omeprazole (PRILOSEC) 40 MG capsule Take 40 mg by mouth daily as needed.     OVER THE COUNTER MEDICATION Take 1 capsule by mouth daily. CBD gummie     Tetrahydrozoline HCl (VISINE OP) Place 1 drop into both eyes daily as needed (dry eyes).     vitamin B-12 (CYANOCOBALAMIN) 1000 MCG tablet Take 1,000 mcg by mouth daily.     No current facility-administered medications for this visit.    OBJECTIVE: Vitals:   02/13/23 1025  BP: (!) 141/91  Pulse: (!) 113  Resp: 18  Temp: 98.1 F (36.7 C)  SpO2: 95%     Body mass index is 35.94 kg/m.    ECOG FS:0 - Asymptomatic  General: Well-developed, well-nourished, no acute distress. Eyes: Pink conjunctiva, anicteric sclera. HEENT: Normocephalic, moist mucous membranes. Lungs: No audible wheezing or coughing. Heart: Regular rate and rhythm. Abdomen: Soft, nontender, no obvious distention. Musculoskeletal: No edema, cyanosis, or clubbing. Neuro: Alert, answering all questions appropriately. Cranial nerves grossly intact. Skin: No rashes or  petechiae noted. Psych: Normal affect.  LAB RESULTS:  Lab Results  Component Value Date   NA 134 (L) 08/01/2020   K 3.7 08/01/2020   CL 100 08/01/2020   CO2 28 08/01/2020   GLUCOSE 119 (H) 08/01/2020   BUN 7 (L) 08/01/2020   CREATININE 0.80 01/15/2023   CALCIUM 8.8 (L) 08/01/2020   GFRNONAA >60 08/01/2020   GFRAA >60 09/10/2019    Lab Results  Component Value Date   WBC 11.0 (H) 08/01/2020   HGB 11.6 (L) 08/01/2020   HCT 36.9 08/01/2020   MCV 83.5 08/01/2020   PLT 330 08/01/2020     STUDIES: NM PET Image Restag (PS) Skull Base To Thigh  Result Date: 02/13/2023 CLINICAL DATA:  Subsequent treatment strategy for lung cancer. EXAM: NUCLEAR MEDICINE PET SKULL BASE TO  THIGH TECHNIQUE: 9.96 mCi F-18 FDG was injected intravenously. Full-ring PET imaging was performed from the skull base to thigh after the radiotracer. CT data was obtained and used for attenuation correction and anatomic localization. Fasting blood glucose: 112 mg/dl COMPARISON:  PET-CT 40/02/2724. CT of the chest, abdomen and pelvis 01/15/2023. FINDINGS: Mediastinal blood pool activity: SUV max 4.3 NECK: No hypermetabolic cervical lymph nodes are identified.Tiny focus of hypermetabolic activity in the left parotid gland (SUV max 2.8) likely related to an incidental small parotid lesion. No suspicious activity identified within the pharyngeal mucosal space. Incidental CT findings: Bilateral carotid atherosclerosis. CHEST: There is residual low level activity medially in the left lower lobe at the site of the previously demonstrated lesion. This currently has an SUV max of 4.7 (previously 11.8). There is low-level activity associated with bandlike opacity more medially in the left lower lobe (SUV max 5.6). This opacity has developed since the diagnostic CT of less than 1 month prior and is likely inflammatory/infectious. Previously demonstrated small nodule medially in the lingula is unchanged, measuring 1.4 x 0.9 cm on image  60/6. This has low level metabolic activity (SUV max 2.4), similar to previous PET-CT. The AP window lymph node or pleural based nodule seen on the recent diagnostic CT shows progressive enlargement and significant hypermetabolic activity. This nodule measures 1.7 x 1.3 cm on image 53/6 and is new from the prior PET-CT; SUV max 8.7. No other hypermetabolic mediastinal, hilar or axillary lymph nodes. Incidental CT findings: Atherosclerosis of the aorta, great vessels and coronary arteries. Bibasilar atelectasis or scarring with new band like opacity in the left lower lobe as described above. ABDOMEN/PELVIS: There is no hypermetabolic activity within the liver, adrenal glands, spleen or pancreas. Uptake within the adrenal glands is within physiologic limits. Low level hypermetabolic activity within a left periaortic node near the aortic bifurcation is unchanged (SUV max 3.6). Multiple hypermetabolic left external iliac lymph nodes are similar to the previous study including a 1.4 cm node on image 129/6 which has an SUV max of 6.3 (previously 6.0). However, there is a new hypermetabolic mass anteriorly in the left greater sciatic notch, measuring 2.0 x 1.3 cm on image 133/6 (SUV max 10.0). Small hypermetabolic left inguinal lymph nodes appear unchanged. Incidental CT findings: Calcified gallstone and small nonobstructing left renal calculi noted. Scattered colonic diverticulosis without evidence of acute inflammation. Diffuse aortic and branch vessel atherosclerosis. SKELETON: New intense hypermetabolic activity associated with the left acromion (SUV max 7.9). This appears lateral to the acromioclavicular joint and is not typical for degenerative uptake. Cannot exclude a small metastasis, although this finding could be posttraumatic. No other evidence of osseous metastatic disease. Incidental CT findings: Status post bilateral total hip arthroplasty with asymmetric synovial thickening and asymmetric metabolic activity  surrounding the left hip. IMPRESSION: 1. Progressive enlargement and significant hypermetabolic activity within the AP window lymph node or pleural based nodule seen on recent diagnostic CT consistent with metastatic disease. 2. The previously demonstrated hypermetabolic lesion medially in the left lower lobe has decreased in metabolic activity, consistent with response to therapy. New bandlike opacity more medially in the left lower lobe with low level metabolic activity is likely inflammatory/infectious. Attention on follow-up. 3. New hypermetabolic soft tissue nodule anteriorly in the left greater sciatic notch. Otherwise similar additional hypermetabolic left external and inguinal lymph nodes. Distribution atypical for metastatic disease from lung cancer. Given asymmetric uptake associated with the left total hip arthroplasty, these lymph nodes could be reactive and related to underlying  particle disease. Consider tissue sampling. 4. New intense hypermetabolic activity associated with the left acromion. This is not typical for degenerative uptake and could be posttraumatic or a small metastasis. 5. Stable small nodule medially in the lingula with low level metabolic activity, nonspecific. 6.  Aortic Atherosclerosis (ICD10-I70.0). Electronically Signed   By: Carey Bullocks M.D.   On: 02/13/2023 10:44   CT CHEST ABDOMEN PELVIS W CONTRAST  Result Date: 01/26/2023 CLINICAL DATA:  Metastatic disease evaluation, non-small-cell lung cancer * Tracking Code: BO * EXAM: CT CHEST, ABDOMEN, AND PELVIS WITH CONTRAST TECHNIQUE: Multidetector CT imaging of the chest, abdomen and pelvis was performed following the standard protocol during bolus administration of intravenous contrast. RADIATION DOSE REDUCTION: This exam was performed according to the departmental dose-optimization program which includes automated exposure control, adjustment of the mA and/or kV according to patient size and/or use of iterative  reconstruction technique. CONTRAST:  OMNIPAQUE IOHEXOL 300 MG/ML SOLN additional oral enteric contrast COMPARISON:  CT chest, 10/03/2022, PET-CT, 09/30/2022 FINDINGS: CT CHEST FINDINGS Cardiovascular: Aortic atherosclerosis. Normal heart size. Left coronary artery calcifications. No pericardial effusion. Mediastinum/Nodes: New prevascular lymph node or pleural soft tissue nodule measuring 1.3 x 0.8 cm (series 2, image 29). No other enlarged mediastinal, hilar, or axillary lymph nodes. Thyroid gland, trachea, and esophagus demonstrate no significant findings. Lungs/Pleura: Diminished size of a paramedian nodule of the superior segment left lower lobe, measuring 2.6 x 1.4 cm, previously 3.5 x 1.9 cm (series 6, image 73). No significant change in a nodule of the medial left upper lobe measuring 1.1 x 0.9 cm, previously 1.1 x 1.0 cm (series 6, image 63). Bibasilar scarring or atelectasis. No pleural effusion or pneumothorax. Musculoskeletal: No chest wall abnormality. No acute osseous findings. CT ABDOMEN PELVIS FINDINGS Hepatobiliary: No solid liver abnormality is seen. Gallstone near the gallbladder neck. No gallbladder wall thickening, or biliary dilatation. Pancreas: Unremarkable. No pancreatic ductal dilatation or surrounding inflammatory changes. Spleen: Normal in size without significant abnormality. Adrenals/Urinary Tract: Adrenal glands are unremarkable. Multiple small nonobstructive left renal calculi. Lobulated cortical scarring, particularly of the inferior pole, sequelae of prior obstructive or infectious insult. No right-sided calculi. No hydronephrosis. Evaluation of the bladder very limited by dense metallic streak artifact from bilateral hip arthroplasty. No obvious abnormality. Stomach/Bowel: Stomach is within normal limits. Appendix appears normal. No evidence of bowel wall thickening, distention, or inflammatory changes. Pancolonic diverticulosis. Vascular/Lymphatic: Aortic atherosclerosis.  Unchanged low left para-aortic lymph node measuring 1.2 x 0.9 cm (series 2, image 80). Unchanged enlarged left external iliac node measuring 2.2 x 1.5 cm (series 2, image 106) Reproductive: Status post hysterectomy. Other: No abdominal wall hernia or abnormality. No ascites. Musculoskeletal: No acute osseous findings. Status post bilateral hip total arthroplasty. IMPRESSION: 1. Diminished size of a paramedian nodule of the superior segment left lower lobe, consistent with treatment response 2. No significant change in a nodule of the medial left upper lobe measuring 1.1 x 0.9 cm, previously 1.1 x 1.0 cm. 3. New prevascular lymph node or pleural soft tissue nodule measuring 1.3 x 0.8 cm concerning for nodal or pleural metastasis. PET-CT could be used to assess for metabolic activity. 4. Unchanged low left para-aortic and left external iliac lymph nodes, which are nonspecific but remain suspicious for nodal metastases. 5. No other evidence of metastatic disease in the chest, abdomen, or pelvis. 6. Cholelithiasis. 7. Nonobstructive left nephrolithiasis. 8. Pancolonic diverticulosis. 9. Coronary artery disease. Aortic Atherosclerosis (ICD10-I70.0). Electronically Signed   By: Bonna Gains.D.  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  CT scan results from January 26, 2023 reviewed independently with diminished size of nodule suggesting treatment response.  There is no change in her in the left upper lobe nodule.  PET scan results from January 26, 2023 reviewed independently and reported as above with minimally positive PET lesions.  Plan to discuss patient at tumor board to determine whether to continue surveillance or repeat biopsy is needed at this time.  Follow-up will be based on tumor board recommendations.  I spent a total of  30 minutes reviewing chart data, face-to-face evaluation with the patient, counseling and coordination of care as detailed above.    Patient expressed understanding and was in agreement with this plan. She also understands that She can call clinic at any time with any questions, concerns, or complaints.    Cancer Staging  No matching staging information was found for the patient.   Jeralyn Ruths, MD   02/13/2023 10:55 AM

## 2023-02-19 ENCOUNTER — Other Ambulatory Visit: Payer: Medicare Other

## 2023-02-19 ENCOUNTER — Other Ambulatory Visit: Payer: Self-pay | Admitting: Oncology

## 2023-02-20 ENCOUNTER — Inpatient Hospital Stay: Payer: Medicare Other | Admitting: Oncology

## 2023-02-20 DIAGNOSIS — M799 Soft tissue disorder, unspecified: Secondary | ICD-10-CM | POA: Diagnosis not present

## 2023-02-20 DIAGNOSIS — C3432 Malignant neoplasm of lower lobe, left bronchus or lung: Secondary | ICD-10-CM

## 2023-02-20 NOTE — Progress Notes (Signed)
[facility]  [phone] [phone]  ID: Hannah Yu OB: Feb 18, 1946  MR#: [id number]  AOZ#:[id number]  Patient Care Team: Mick Sell, MD as PCP - General (Infectious Diseases) Glory Buff, RN as Oncology Nurse Navigator  I connected with Hannah Yu on 02/20/23 at 10:30 AM EDT by video enabled telemedicine visit and verified that I am speaking with the correct person using two identifiers.   I discussed the limitations, risks, security and privacy concerns of performing an evaluation and management service by telemedicine and the availability of in-person appointments. I also discussed with the patient that there may be a patient responsible charge related to this service. The patient expressed understanding and agreed to proceed.   Other persons participating in the visit and their role in the encounter: Patient, MD.  Patient's location: Home. Provider's location: Clinic.  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  She continues to feel well and remains asymptomatic.  She has no neurologic complaints.  She denies any recent fevers.  She has a good appetite and denies weight loss.  She has no chest pain, shortness of breath, cough, or hemoptysis.  She denies any nausea, vomiting, constipation, or diarrhea.  She has no urinary complaints.  Patient offers no specific complaints today.  REVIEW OF SYSTEMS:   Review of Systems  Constitutional: Negative.  Negative for fever, malaise/fatigue and weight loss.  Respiratory: Negative.  Negative for cough, hemoptysis and shortness of breath.   Cardiovascular: Negative.  Negative for chest pain and leg swelling.  Gastrointestinal: Negative.  Negative for abdominal pain.   Genitourinary: Negative.  Negative for dysuria.  Musculoskeletal: Negative.  Negative for back pain.  Skin: Negative.  Negative for rash.  Neurological: Negative.  Negative for dizziness, focal weakness, weakness and headaches.  Psychiatric/Behavioral: Negative.  The patient is not nervous/anxious.     As per HPI. Otherwise, a complete review of systems is negative.  PAST MEDICAL HISTORY: Past Medical History:  Diagnosis Date   Acid reflux    Anemia    Anesthesia complication    Tachycardia previously, now on metoprolol   Arthritis    09/02/2019: per patient "have it real bad in both hands and back"   Difficult intubation    had to terminate intubation unable to advance tube for cholecystectomy 2017?   DVT (deep venous thrombosis) (HCC)    leg   Hypertension    Sciatica    09/02/2019: has had it for about 3 years    PAST SURGICAL HISTORY: Past Surgical History:  Procedure Laterality Date   ABDOMINAL HYSTERECTOMY     CATARACT EXTRACTION W/ INTRAOCULAR LENS IMPLANT Bilateral 2007   eyes done within 1 month of each other.   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   TOTAL HIP ARTHROPLASTY Right 09/06/2019   Procedure: RIGHT TOTAL HIP ARTHROPLASTY ANTERIOR APPROACH;  Surgeon: Kathryne Hitch, MD;  Location: [facility] OR;  Service: Orthopedics;  Laterality: Right;   TOTAL HIP ARTHROPLASTY Left 07/31/2020   Procedure: LEFT TOTAL HIP ARTHROPLASTY ANTERIOR APPROACH;  Surgeon: Kathryne Hitch, MD;  Location: [facility] OR;  Service: Orthopedics;  Laterality: Left;   VIDEO BRONCHOSCOPY WITH ENDOBRONCHIAL ULTRASOUND N/A 10/06/2022   Procedure: VIDEO BRONCHOSCOPY WITH ENDOBRONCHIAL ULTRASOUND;  Surgeon: Vida Rigger, MD;  Location: [facility] ORS;  Service: Thoracic;  Laterality: N/A;    FAMILY HISTORY: Family History  Problem Relation Age of Onset   Breast cancer Sister 63    ADVANCED DIRECTIVES (Y/N):  N  HEALTH  MAINTENANCE: Social History   Tobacco Use   Smoking status: Former    Current packs/day: 0.00    Types: Cigarettes    Quit date: 01/02/1993    Years since quitting: 30.1   Smokeless tobacco: Never  Vaping Use   Vaping status: Never Used  Substance Use Topics   Alcohol use: Yes    Alcohol/week: 4.0 standard drinks of alcohol    Types: 4 Glasses of wine per week    Comment: 09/02/2019: per patient "ever so often"   Drug use: No     Colonoscopy:  PAP:  Bone density:  Lipid panel:  Allergies  Allergen Reactions   Ezetimibe Other (See Comments)    Body aches, and stiffness   Codeine Diarrhea   Cortisone Swelling    injections   Lovastatin Other (See Comments)    Muscle cramps    Current Outpatient Medications  Medication Sig Dispense Refill   amLODipine (NORVASC) 10 MG tablet Take 10 mg by mouth daily.     aspirin EC 81 MG tablet Take 162 mg by mouth daily. Swallow whole.     cholecalciferol (VITAMIN D3) 25 MCG (1000 UNIT) tablet Take 1,000 Units by mouth daily.     cyclobenzaprine (FLEXERIL) 5 MG tablet Take 5 mg by mouth 3 (three) times daily as needed for muscle spasms.     FIBER PO Take 1 tablet by mouth daily.     furosemide (LASIX) 40 MG tablet Take 40 mg by mouth daily.     ibuprofen (ADVIL) 200 MG tablet Take 400 mg by mouth every 6 (six) hours as needed for mild pain or moderate pain.     Ibuprofen-diphenhydrAMINE HCl (IBUPROFEN PM) 200-25 MG CAPS Take 2 tablets by mouth at bedtime as needed (sleep).     losartan (COZAAR) 100 MG tablet Take 100 mg by mouth daily.     magnesium oxide (MAG-OX) 400 (240 Mg) MG tablet Take 400 mg by mouth daily.     metoprolol succinate (TOPROL-XL) 50 MG 24 hr tablet Take 50 mg by mouth daily.     omeprazole (PRILOSEC) 40 MG capsule Take 40 mg by mouth daily as needed.     OVER THE COUNTER MEDICATION Take 1 capsule by mouth daily. CBD gummie     Tetrahydrozoline HCl (VISINE OP) Place 1 drop into both eyes daily as needed (dry eyes).      vitamin B-12 (CYANOCOBALAMIN) 1000 MCG tablet Take 1,000 mcg by mouth daily.     No current facility-administered medications for this visit.    OBJECTIVE: There were no vitals filed for this visit.    There is no height or weight on file to calculate BMI.    ECOG FS:0 - Asymptomatic  General: Well-developed, well-nourished, no acute distress. HEENT: Normocephalic. Neuro: Alert, answering all questions appropriately. Cranial nerves grossly intact. Psych: Normal affect.  LAB RESULTS:  Lab Results  Component Value Date   NA 134 (L) 08/01/2020   K 3.7 08/01/2020   CL 100 08/01/2020   CO2 28 08/01/2020   GLUCOSE 119 (H) 08/01/2020   BUN 7 (L) 08/01/2020   CREATININE 0.80 01/15/2023   CALCIUM 8.8 (L) 08/01/2020   GFRNONAA >60 08/01/2020   GFRAA >60 09/10/2019    Lab Results  Component Value Date   WBC  11.0 (H) 08/01/2020   HGB 11.6 (L) 08/01/2020   HCT 36.9 08/01/2020   MCV 83.5 08/01/2020   PLT 330 08/01/2020     STUDIES: NM PET Image Restag (PS) Skull Base To Thigh  Result Date: 02/13/2023 CLINICAL DATA:  Subsequent treatment strategy for lung cancer. EXAM: NUCLEAR MEDICINE PET SKULL BASE TO THIGH TECHNIQUE: 9.96 mCi F-18 FDG was injected intravenously. Full-ring PET imaging was performed from the skull base to thigh after the radiotracer. CT data was obtained and used for attenuation correction and anatomic localization. Fasting blood glucose: 112 mg/dl COMPARISON:  PET-CT 62/13/0865. CT of the chest, abdomen and pelvis 01/15/2023. FINDINGS: Mediastinal blood pool activity: SUV max 4.3 NECK: No hypermetabolic cervical lymph nodes are identified.Tiny focus of hypermetabolic activity in the left parotid gland (SUV max 2.8) likely related to an incidental small parotid lesion. No suspicious activity identified within the pharyngeal mucosal space. Incidental CT findings: Bilateral carotid atherosclerosis. CHEST: There is residual low level activity medially in the left lower  lobe at the site of the previously demonstrated lesion. This currently has an SUV max of 4.7 (previously 11.8). There is low-level activity associated with bandlike opacity more medially in the left lower lobe (SUV max 5.6). This opacity has developed since the diagnostic CT of less than 1 month prior and is likely inflammatory/infectious. Previously demonstrated small nodule medially in the lingula is unchanged, measuring 1.4 x 0.9 cm on image 60/6. This has low level metabolic activity (SUV max 2.4), similar to previous PET-CT. The AP window lymph node or pleural based nodule seen on the recent diagnostic CT shows progressive enlargement and significant hypermetabolic activity. This nodule measures 1.7 x 1.3 cm on image 53/6 and is new from the prior PET-CT; SUV max 8.7. No other hypermetabolic mediastinal, hilar or axillary lymph nodes. Incidental CT findings: Atherosclerosis of the aorta, great vessels and coronary arteries. Bibasilar atelectasis or scarring with new band like opacity in the left lower lobe as described above. ABDOMEN/PELVIS: There is no hypermetabolic activity within the liver, adrenal glands, spleen or pancreas. Uptake within the adrenal glands is within physiologic limits. Low level hypermetabolic activity within a left periaortic node near the aortic bifurcation is unchanged (SUV max 3.6). Multiple hypermetabolic left external iliac lymph nodes are similar to the previous study including a 1.4 cm node on image 129/6 which has an SUV max of 6.3 (previously 6.0). However, there is a new hypermetabolic mass anteriorly in the left greater sciatic notch, measuring 2.0 x 1.3 cm on image 133/6 (SUV max 10.0). Small hypermetabolic left inguinal lymph nodes appear unchanged. Incidental CT findings: Calcified gallstone and small nonobstructing left renal calculi noted. Scattered colonic diverticulosis without evidence of acute inflammation. Diffuse aortic and branch vessel atherosclerosis. SKELETON:  New intense hypermetabolic activity associated with the left acromion (SUV max 7.9). This appears lateral to the acromioclavicular joint and is not typical for degenerative uptake. Cannot exclude a small metastasis, although this finding could be posttraumatic. No other evidence of osseous metastatic disease. Incidental CT findings: Status post bilateral total hip arthroplasty with asymmetric synovial thickening and asymmetric metabolic activity surrounding the left hip. IMPRESSION: 1. Progressive enlargement and significant hypermetabolic activity within the AP window lymph node or pleural based nodule seen on recent diagnostic CT consistent with metastatic disease. 2. The previously demonstrated hypermetabolic lesion medially in the left lower lobe has decreased in metabolic activity, consistent with response to therapy. New bandlike opacity more medially in the left lower lobe with low level metabolic  activity is likely inflammatory/infectious. Attention on follow-up. 3. New hypermetabolic soft tissue nodule anteriorly in the left greater sciatic notch. Otherwise similar additional hypermetabolic left external and inguinal lymph nodes. Distribution atypical for metastatic disease from lung cancer. Given asymmetric uptake associated with the left total hip arthroplasty, these lymph nodes could be reactive and related to underlying particle disease. Consider tissue sampling. 4. New intense hypermetabolic activity associated with the left acromion. This is not typical for degenerative uptake and could be posttraumatic or a small metastasis. 5. Stable small nodule medially in the lingula with low level metabolic activity, nonspecific. 6.  Aortic Atherosclerosis (ICD10-I70.0). Electronically Signed   By: Carey Bullocks M.D.   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  PET scan results from February 13, 2023 reviewed independently and reported as above with residual low CT level of activity suggesting treatment response in the left lower lobe.  The AP window lymph node seen on recent CT scan reveals progressive enlargement now measuring 1.7 x 1.3 cm as well as increased hypermetabolic activity.  Biopsy will be too difficult in this area, so the recommendation is to proceed directly with XRT.  A referral has been sent to radiation oncology.  Left greater sciatic notch lesion: This is a new hypermetabolic mass measuring 2.0 x 1.3 cm.  Unusual pattern of spread for a lung cancer, therefore interventional radiology recommended CT-guided biopsy to confirm diagnosis.  This could possibly be a second primary as well.  Patient will follow-up 1 week after her biopsy to discuss the results.   I provided 30 minutes of face-to-face video visit time during this encounter which included chart review, counseling, and coordination of care as documented above.    Patient expressed understanding and was in agreement with this plan. She also understands that She can call clinic at any time with any questions, concerns, or complaints.    Cancer Staging  No matching staging information was found for the patient.   Jeralyn Ruths, MD   02/20/2023 11:24 AM

## 2023-02-24 ENCOUNTER — Encounter: Payer: Self-pay | Admitting: *Deleted

## 2023-02-24 ENCOUNTER — Ambulatory Visit
Admission: RE | Admit: 2023-02-24 | Discharge: 2023-02-24 | Disposition: A | Discharge status: Home / Self Care | Payer: Medicare Other | Source: Ambulatory Visit | Attending: Radiation Oncology | Admitting: Radiation Oncology

## 2023-02-24 ENCOUNTER — Encounter: Payer: Self-pay | Admitting: Radiation Oncology

## 2023-02-24 VITALS — BP 124/77 | HR 114 | Temp 98.9°F | Ht 60.0 in | Wt 187.2 lb

## 2023-02-24 DIAGNOSIS — C3412 Malignant neoplasm of upper lobe, left bronchus or lung: Secondary | ICD-10-CM

## 2023-02-24 NOTE — Progress Notes (Signed)
Radiation Oncology Follow up Note old patient new area left upper lobe lesion  Name: Hannah Yu   Date:   02/24/2023 MRN:  [id number] DOB: Apr 23, 1946    This 77 y.o. female presents to the clinic today for evaluation of new hypermetabolic left upper lobe lesion and patient now out 4 months SBRT to her left lower lobe.  REFERRING PROVIDER: Mick Sell, MD  HPI: Patient is a 77 year old female now out approximately 4 months having pleated SBRT to left upper lobe for a stage I non-small cell lung cancer..  Of the left lower lobe.  Repeat PET CT scan showed marked decrease hypermetabolic activity in the left lower lobe lesion although there was a new left upper lobe hypermetabolic lesion.  She also had a hypermetabolic lesion measuring 2 cm anterior left greater sciatic notch.  There also was a hypermetabolic lesion in the left acromion cannot rule out malignancy.  She continues to be asymptomatic specifically [place] cough hemoptysis or chest tightness.  She is scheduled for biopsy of the sciatic notch lesion.  COMPLICATIONS OF TREATMENT: none  FOLLOW UP COMPLIANCE: keeps appointments   PHYSICAL EXAM:  BP 124/77   Pulse (!) 114   Temp 98.9 F (37.2 C)   Ht 5' (1.524 m)   Wt 187 lb 3.2 oz (84.9 kg)   BMI 36.56 kg/m  Well-developed well-nourished patient in NAD. HEENT reveals PERLA, EOMI, discs not visualized.  Oral cavity is clear. No oral mucosal lesions are identified. Neck is clear without evidence of cervical or supraclavicular adenopathy. Lungs are clear to A&P. Cardiac examination is essentially unremarkable with regular rate and rhythm without murmur rub or thrill. Abdomen is benign with no organomegaly or masses noted. Motor sensory and DTR levels are equal and symmetric in the upper and lower extremities. Cranial nerves II through XII are grossly intact. Proprioception is intact. No peripheral adenopathy or edema is identified. No motor or sensory levels are noted. Crude  visual fields are within normal range.  RADIOLOGY RESULTS: PET CT scan reviewed Case presented at our tumor board  PLAN: At this time elect go ahead with SBRT to the new left upper lobe nodule.  Would plan on delivering 60 Gray in 5 fractions.  Risks and benefits of treatment including extremely low side effect profile possible developing a cough were explained to the patient in detail.  We also await pathology report from the CT-guided biopsy of her sciatic notch new hypermetabolic nodule.  The lesion of the left acromion we will continue to observe should that progress may offer SBRT treatment to that also.  Patient comprehends my recommendations well.  I would like to take this opportunity to thank you for allowing me to participate in the care of your patient.Carmina Miller, MD

## 2023-03-03 NOTE — Progress Notes (Signed)
Richarda Overlie, MD sent to [facility] Daft Discussed at tumor conference.  Oncology wants to biopsy the left pelvic disease.  The new lesion at left sciatic notch, PET CT 02/06/23, image 133, sequence 607  [place]

## 2023-03-04 ENCOUNTER — Other Ambulatory Visit: Payer: Self-pay | Admitting: Radiation Oncology

## 2023-03-04 DIAGNOSIS — C801 Malignant (primary) neoplasm, unspecified: Secondary | ICD-10-CM

## 2023-03-05 ENCOUNTER — Ambulatory Visit
Admission: RE | Admit: 2023-03-05 | Discharge: 2023-03-05 | Disposition: A | Discharge status: Home / Self Care | Payer: Medicare Other | Source: Ambulatory Visit | Attending: Radiation Oncology | Admitting: Radiation Oncology

## 2023-03-05 DIAGNOSIS — R911 Solitary pulmonary nodule: Secondary | ICD-10-CM | POA: Diagnosis present

## 2023-03-05 DIAGNOSIS — Z87891 Personal history of nicotine dependence: Secondary | ICD-10-CM | POA: Diagnosis not present

## 2023-03-05 DIAGNOSIS — C801 Malignant (primary) neoplasm, unspecified: Secondary | ICD-10-CM

## 2023-03-05 DIAGNOSIS — C3432 Malignant neoplasm of lower lobe, left bronchus or lung: Secondary | ICD-10-CM | POA: Diagnosis present

## 2023-03-09 DIAGNOSIS — M948X9 Other specified disorders of cartilage, unspecified sites: Secondary | ICD-10-CM | POA: Diagnosis not present

## 2023-03-09 DIAGNOSIS — G5700 Lesion of sciatic nerve, unspecified lower limb: Secondary | ICD-10-CM | POA: Insufficient documentation

## 2023-03-09 DIAGNOSIS — Z79899 Other long term (current) drug therapy: Secondary | ICD-10-CM | POA: Insufficient documentation

## 2023-03-09 DIAGNOSIS — Z803 Family history of malignant neoplasm of breast: Secondary | ICD-10-CM | POA: Insufficient documentation

## 2023-03-09 DIAGNOSIS — Z87891 Personal history of nicotine dependence: Secondary | ICD-10-CM | POA: Diagnosis not present

## 2023-03-09 DIAGNOSIS — C3432 Malignant neoplasm of lower lobe, left bronchus or lung: Secondary | ICD-10-CM | POA: Diagnosis present

## 2023-03-09 DIAGNOSIS — Z510 Encounter for antineoplastic radiation therapy: Secondary | ICD-10-CM | POA: Diagnosis present

## 2023-03-09 DIAGNOSIS — R911 Solitary pulmonary nodule: Secondary | ICD-10-CM | POA: Insufficient documentation

## 2023-03-10 ENCOUNTER — Other Ambulatory Visit: Payer: Self-pay

## 2023-03-10 DIAGNOSIS — R1909 Other intra-abdominal and pelvic swelling, mass and lump: Secondary | ICD-10-CM

## 2023-03-10 NOTE — H&P (Signed)
Chief Complaint: Patient was seen in consultation today for soft tissue lesion of left pelvic region.  Referring Physician(s): Finnegan,Timothy J  Supervising Physician: Roanna Banning  Patient Status: [facility] - Out-pt  History of Present Illness: Hannah Yu is a 77 y.o. female with PMH significant for anemia, DVT, hypertension, GERD, and left lower lobe adenocarcinoma of the lung being seen today in relation to newly discovered left pelvic lesion. Patient has been under the care of Dr Orlie Dakin from Oncology service for left lung cancer with completion of SBRT in July of 2024. Recent PET scan on 02/06/23 showed hypermetabolic activity of soft tissue nodule measuring 2.0 x 1.3 cm in the pelvis at left sciatic notch. Patient was subsequently referred to IR for image-guided biopsy.  Past Medical History:  Diagnosis Date   Acid reflux    Anemia    Anesthesia complication    Tachycardia previously, now on metoprolol   Arthritis    09/02/2019: per patient "have it real bad in both hands and back"   Difficult intubation    had to terminate intubation unable to advance tube for cholecystectomy 2017?   DVT (deep venous thrombosis) (HCC)    leg   Hypertension    Sciatica    09/02/2019: has had it for about 3 years    Past Surgical History:  Procedure Laterality Date   ABDOMINAL HYSTERECTOMY     CATARACT EXTRACTION W/ INTRAOCULAR LENS IMPLANT Bilateral 2007   eyes done within 1 month of each other.   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   TOTAL HIP ARTHROPLASTY Right 09/06/2019   Procedure: RIGHT TOTAL HIP ARTHROPLASTY ANTERIOR APPROACH;  Surgeon: Kathryne Hitch, MD;  Location: [facility] OR;  Service: Orthopedics;  Laterality: Right;   TOTAL HIP ARTHROPLASTY Left 07/31/2020   Procedure: LEFT TOTAL HIP ARTHROPLASTY ANTERIOR APPROACH;  Surgeon: Kathryne Hitch, MD;  Location: [facility] OR;  Service: Orthopedics;   Laterality: Left;   VIDEO BRONCHOSCOPY WITH ENDOBRONCHIAL ULTRASOUND N/A 10/06/2022   Procedure: VIDEO BRONCHOSCOPY WITH ENDOBRONCHIAL ULTRASOUND;  Surgeon: Vida Rigger, MD;  Location: [facility] ORS;  Service: Thoracic;  Laterality: N/A;    Allergies: Ezetimibe, Codeine, Cortisone, and Lovastatin  Medications: Prior to Admission medications   Medication Sig Start Date End Date Taking? Authorizing Provider  amLODipine (NORVASC) 10 MG tablet Take 10 mg by mouth daily. 07/15/19   [provider]  aspirin EC 81 MG tablet Take 162 mg by mouth daily. Swallow whole.    [provider]  cholecalciferol (VITAMIN D3) 25 MCG (1000 UNIT) tablet Take 1,000 Units by mouth daily.    [provider]  cyclobenzaprine (FLEXERIL) 5 MG tablet Take 5 mg by mouth 3 (three) times daily as needed for muscle spasms.    [provider]  FIBER PO Take 1 tablet by mouth daily.    [provider]  furosemide (LASIX) 40 MG tablet Take 40 mg by mouth daily.    [provider]  ibuprofen (ADVIL) 200 MG tablet Take 400 mg by mouth every 6 (six) hours as needed for mild pain or moderate pain.    [provider]  Ibuprofen-diphenhydrAMINE HCl (IBUPROFEN PM) 200-25 MG CAPS Take 2 tablets by mouth at bedtime as needed (sleep).    [provider]  losartan (COZAAR) 100 MG tablet Take 100 mg by mouth daily. 07/15/19   [provider]  magnesium oxide (MAG-OX) 400 (240 Mg) MG tablet Take 400  mg by mouth daily.    [provider]  metoprolol succinate (TOPROL-XL) 50 MG 24 hr tablet Take 50 mg by mouth daily. 06/24/19   [provider]  omeprazole (PRILOSEC) 40 MG capsule Take 40 mg by mouth daily as needed.    [provider]  OVER THE COUNTER MEDICATION Take 1 capsule by mouth daily. CBD gummie    [provider]  Tetrahydrozoline HCl (VISINE OP) Place 1 drop into both eyes daily as needed (dry eyes).    [provider]  vitamin B-12 (CYANOCOBALAMIN) 1000 MCG tablet Take 1,000 mcg by mouth daily.    [provider]     Family History  Problem Relation Age of Onset   Breast cancer Sister 41    Social History   Socioeconomic History   Marital status: Single    Spouse name: Not on file   Number of children: Not on file   Years of education: Not on file   Highest education level: Not on file  Occupational History   Not on file  Tobacco Use   Smoking status: Former    Current packs/day: 0.00    Types: Cigarettes    Quit date: 01/02/1993    Years since quitting: 30.2   Smokeless tobacco: Never  Vaping Use   Vaping status: Never Used  Substance and Sexual Activity   Alcohol use: Yes    Alcohol/week: 4.0 standard drinks of alcohol    Types: 4 Glasses of wine per week    Comment: 09/02/2019: per patient "ever so often"   Drug use: No   Sexual activity: Not on file  Other Topics Concern   Not on file  Social History Narrative   Not on file   Social Determinants of Health   Financial Resource Strain: Low Risk  (02/05/2023)   Received from [facility]   Overall Financial Resource Strain (CARDIA)    Difficulty of Paying Living Expenses: Not hard at all  Food Insecurity: No Food Insecurity (02/05/2023)   Received from [facility]   Hunger Vital Sign    Worried About Running Out of Food in the Last Year: Never true    Ran Out of Food in the Last Year: Never true  Transportation Needs: No Transportation Needs (02/05/2023)   Received from [facility] - Transportation    In the past 12 months, has lack of transportation kept you from medical appointments or from getting medications?: No    Lack of Transportation (Non-Medical): No  Physical Activity: Not on file  Stress: Not on file  Social Connections: Unknown (09/17/2021)   Received from [facility]   Social Network    Social Network: Not on  file    Code Status: Full code  Review of Systems: A 12 point ROS discussed and pertinent positives are indicated in the HPI above.  All other systems are negative.  Review of Systems  Vital Signs: There were no vitals taken for this visit.***   Physical Exam  Imaging: No results found.  Labs:  CBC: No results for input(s): "WBC", "HGB", "HCT", "PLT" in the last 8760 hours.  COAGS: Recent Labs    10/01/22 1026  INR 1.0  APTT 28    BMP: Recent Labs    10/14/22 1700 01/15/23 1350  CREATININE 1.40* 0.80    LIVER FUNCTION TESTS: No results for input(s): "BILITOT", "AST", "ALT", "ALKPHOS", "PROT", "ALBUMIN" in the last 8760  hours.  TUMOR MARKERS: No results for input(s): "AFPTM", "CEA", "CA199", "CHROMGRNA" in the last 8760 hours.  Assessment and Plan:  Hannah Yu is a 77 yo female with history significant for adenocarcinoma of left lung being seen today in relation to newly discovered hypermetabolic nodule of the left greater sciatic notch. Patient has been under the care of Dr Orlie Dakin from Oncology service. Imaging reviewed and approved by Dr Lowella Dandy for image-guided biopsy of 2.0 x 1.3 cm left greater sciatic notch hypermetabolic nodule. Case reviewed this AM with Dr Milford Cage. Patient presents today in her usual state of health and is NPO. Pre-procedural labs are pending.  Risks and benefits of image-guided left greater sciatic nodule biopsy was discussed with the patient and/or patient's family including, but not limited to bleeding, infection, damage to adjacent structures or low yield requiring additional tests.  All of the questions were answered and there is agreement to proceed.  Consent signed and in chart.   Thank you for this interesting consult.  I greatly enjoyed meeting Hannah Yu and look forward to participating in their care.  A copy of this report was sent to the requesting provider on this date.  Electronically Signed: Kennieth Francois, PA-C 03/10/2023, 3:47 PM   I spent a total of {New ZOXW:[id number]} {New Out-Pt:[id number]}  {Established Out-Pt:[id number]} in face to face in clinical consultation, greater than 50% of which was counseling/coordinating care for ***

## 2023-03-10 NOTE — Progress Notes (Signed)
Patient for CT guided LT Pelvic lesion biopsy on Wed 03/11/2023, I called and spoke with the patient on the phone and gave pre-procedure instructions. Pt was made aware to be here at 7:30a, last dose of ASA 81mg  was on Thurs 03/05/23, NPO after MN prior to procedure as well as driver post procedure/recovery/discharge. Pt stated understanding.  Called 03/10/23

## 2023-03-11 ENCOUNTER — Ambulatory Visit
Admission: RE | Admit: 2023-03-11 | Discharge: 2023-03-11 | Disposition: A | Discharge status: Home / Self Care | Payer: Medicare Other | Source: Ambulatory Visit | Attending: Oncology | Admitting: Oncology

## 2023-03-11 DIAGNOSIS — K219 Gastro-esophageal reflux disease without esophagitis: Secondary | ICD-10-CM | POA: Insufficient documentation

## 2023-03-11 DIAGNOSIS — I10 Essential (primary) hypertension: Secondary | ICD-10-CM | POA: Insufficient documentation

## 2023-03-11 DIAGNOSIS — R1900 Intra-abdominal and pelvic swelling, mass and lump, unspecified site: Secondary | ICD-10-CM | POA: Diagnosis present

## 2023-03-11 DIAGNOSIS — R1909 Other intra-abdominal and pelvic swelling, mass and lump: Secondary | ICD-10-CM

## 2023-03-11 DIAGNOSIS — Z86718 Personal history of other venous thrombosis and embolism: Secondary | ICD-10-CM | POA: Diagnosis not present

## 2023-03-11 DIAGNOSIS — M799 Soft tissue disorder, unspecified: Secondary | ICD-10-CM

## 2023-03-11 LAB — PROTIME-INR
INR: 1 (ref 0.8–1.2)
Prothrombin Time: 12.8 s (ref 11.4–15.2)

## 2023-03-11 LAB — CBC
HCT: 49.2 % — ABNORMAL HIGH (ref 36.0–46.0)
Hemoglobin: 15.6 g/dL — ABNORMAL HIGH (ref 12.0–15.0)
MCH: 25.9 pg — ABNORMAL LOW (ref 26.0–34.0)
MCHC: 31.7 g/dL (ref 30.0–36.0)
MCV: 81.6 fL (ref 80.0–100.0)
Platelets: 389 10*3/uL (ref 150–400)
RBC: 6.03 MIL/uL — ABNORMAL HIGH (ref 3.87–5.11)
RDW: 15.9 % — ABNORMAL HIGH (ref 11.5–15.5)
WBC: 8.4 10*3/uL (ref 4.0–10.5)
nRBC: 0 % (ref 0.0–0.2)

## 2023-03-11 MED ORDER — MIDAZOLAM HCL 2 MG/2ML IJ SOLN
INTRAMUSCULAR | Status: AC
  Filled 2023-03-11: qty 2

## 2023-03-11 MED ORDER — SODIUM CHLORIDE 0.9 % IV SOLN: INTRAVENOUS | Status: DC

## 2023-03-11 MED ORDER — FENTANYL CITRATE (PF) 100 MCG/2ML IJ SOLN
INTRAMUSCULAR | Status: AC
  Filled 2023-03-11: qty 2

## 2023-03-11 MED ORDER — FENTANYL CITRATE (PF) 100 MCG/2ML IJ SOLN
INTRAMUSCULAR | Status: AC | PRN
  Administered 2023-03-11: 25 ug via INTRAVENOUS
  Administered 2023-03-11: 50 ug via INTRAVENOUS

## 2023-03-11 MED ORDER — LIDOCAINE 1 % OPTIME INJ - NO CHARGE
10.0000 mL | Freq: Once | INTRAMUSCULAR | Status: DC
  Filled 2023-03-11: qty 10

## 2023-03-11 MED ORDER — MIDAZOLAM HCL 5 MG/5ML IJ SOLN
INTRAMUSCULAR | Status: AC | PRN
  Administered 2023-03-11: 1 mg via INTRAVENOUS

## 2023-03-11 NOTE — Progress Notes (Signed)
Patient clinically stable post CT Mass biopsy per Dr Milford Cage, tolerated well. Vitals stable pre and post procedure. Received Versed 1 mg along with Fentanyl 75 mcg IV for procedure. Report given to Talbert Forest RN, specials/22/.

## 2023-03-11 NOTE — Discharge Instructions (Signed)
Needle Biopsy, Care After These instructions tell you how to care for yourself after your procedure. Your doctor may also give you more specific instructions. Call your doctor if you have any problems or questions. What can I expect after the procedure? After the procedure, it is common to have: Soreness. Bruising. Mild pain. Follow these instructions at home:  Return to your normal activities tomorrow. Take over-the-counter and prescription medicines only as told by your doctor. Wash your hands with soap and water before you change your bandage (dressing). If you cannot use soap and water, use hand sanitizer. You may remove your bandage in 2 days Check your puncture site every day for signs of infection. Watch for: Redness, swelling, or pain. Fluid or blood.  Pus or a bad smell. Warmth. You can shower tomorrow Keep all follow-up visits as told by your doctor. This is important. Contact a doctor if you have: A fever. Redness, swelling, or pain at the puncture site, and it lasts longer than a few days. Fluid, blood, or pus coming from the puncture site. Warmth coming from the puncture site. Get help right away if: You have a lot of bleeding from the puncture site. Summary After the procedure, it is common to have soreness, bruising, or mild pain at the puncture site. Check your puncture site every day for signs of infection, such as redness, swelling, or pain. Get help right away if you have severe bleeding from your puncture site. This information is not intended to replace advice given to you by your health care provider. Make sure you discuss any questions you have with your health care provider. Document Revised: 05/04/2017 Document Reviewed: 05/04/2017 Elsevier Patient Education  2020 [employer]  

## 2023-03-11 NOTE — Procedures (Signed)
Vascular and Interventional Radiology Procedure Note  Patient: Hannah Yu DOB: 1945/08/14 Medical Record Number: [id number] Note Date/Time: 03/11/23 8:40 AM   Performing Physician: Roanna Banning, MD Assistant(s): None  Diagnosis: HM pelvic mass. Hx lung CA   Procedure: LEFT PELVIC MASS BIOPSY  Anesthesia: Conscious Sedation Complications: None Estimated Blood Loss: Minimal Specimens: Sent for Pathology  Findings:  Successful CT-guided biopsy of L pelvic mass. A total of 3 samples were obtained. Hemostasis of the tract was achieved using Manual Pressure.  Plan: Bed rest for 1 hours.  See detailed procedure note with images in PACS. The patient tolerated the procedure well without incident or complication and was returned to Recovery in stable condition.    Roanna Banning, MD Vascular and Interventional Radiology Specialists [facility] Radiology   Pager. [phone] Clinic. [phone]

## 2023-03-12 ENCOUNTER — Ambulatory Visit
Admission: RE | Admit: 2023-03-12 | Discharge: 2023-03-12 | Disposition: A | Discharge status: Home / Self Care | Payer: Medicare Other | Source: Ambulatory Visit | Attending: Radiation Oncology | Admitting: Radiation Oncology

## 2023-03-12 ENCOUNTER — Other Ambulatory Visit: Payer: Self-pay

## 2023-03-12 DIAGNOSIS — Z510 Encounter for antineoplastic radiation therapy: Secondary | ICD-10-CM | POA: Diagnosis not present

## 2023-03-12 LAB — RAD ONC ARIA SESSION SUMMARY
Course Elapsed Days: 0
Plan Fractions Treated to Date: 1
Plan Prescribed Dose Per Fraction: 10 Gy
Plan Total Fractions Prescribed: 5
Plan Total Prescribed Dose: 50 Gy
Reference Point Dosage Given to Date: 10 Gy
Reference Point Session Dosage Given: 10 Gy
Session Number: 1

## 2023-03-16 ENCOUNTER — Ambulatory Visit
Admission: RE | Admit: 2023-03-16 | Discharge: 2023-03-16 | Disposition: A | Discharge status: Home / Self Care | Payer: Medicare Other | Source: Ambulatory Visit | Attending: Radiation Oncology | Admitting: Radiation Oncology

## 2023-03-16 ENCOUNTER — Other Ambulatory Visit: Payer: Self-pay

## 2023-03-16 DIAGNOSIS — Z510 Encounter for antineoplastic radiation therapy: Secondary | ICD-10-CM | POA: Diagnosis not present

## 2023-03-16 LAB — RAD ONC ARIA SESSION SUMMARY
Course Elapsed Days: 4
Plan Fractions Treated to Date: 2
Plan Prescribed Dose Per Fraction: 10 Gy
Plan Total Fractions Prescribed: 5
Plan Total Prescribed Dose: 50 Gy
Reference Point Dosage Given to Date: 20 Gy
Reference Point Session Dosage Given: 10 Gy
Session Number: 2

## 2023-03-16 LAB — SURGICAL PATHOLOGY

## 2023-03-17 ENCOUNTER — Ambulatory Visit: Payer: Medicare Other

## 2023-03-18 ENCOUNTER — Encounter: Payer: Self-pay | Admitting: *Deleted

## 2023-03-18 ENCOUNTER — Encounter: Payer: Self-pay | Admitting: Oncology

## 2023-03-18 ENCOUNTER — Other Ambulatory Visit: Payer: Self-pay

## 2023-03-18 ENCOUNTER — Inpatient Hospital Stay (HOSPITAL_BASED_OUTPATIENT_CLINIC_OR_DEPARTMENT_OTHER): Payer: Medicare Other | Admitting: Oncology

## 2023-03-18 ENCOUNTER — Ambulatory Visit
Admission: RE | Admit: 2023-03-18 | Discharge: 2023-03-18 | Disposition: A | Discharge status: Home / Self Care | Payer: Medicare Other | Source: Ambulatory Visit | Attending: Radiation Oncology | Admitting: Radiation Oncology

## 2023-03-18 VITALS — BP 130/78 | HR 68 | Temp 98.5°F | Ht 60.0 in | Wt 181.0 lb

## 2023-03-18 DIAGNOSIS — G5700 Lesion of sciatic nerve, unspecified lower limb: Secondary | ICD-10-CM | POA: Insufficient documentation

## 2023-03-18 DIAGNOSIS — M948X9 Other specified disorders of cartilage, unspecified sites: Secondary | ICD-10-CM | POA: Insufficient documentation

## 2023-03-18 DIAGNOSIS — C3432 Malignant neoplasm of lower lobe, left bronchus or lung: Secondary | ICD-10-CM | POA: Insufficient documentation

## 2023-03-18 DIAGNOSIS — Z803 Family history of malignant neoplasm of breast: Secondary | ICD-10-CM | POA: Insufficient documentation

## 2023-03-18 DIAGNOSIS — Z510 Encounter for antineoplastic radiation therapy: Secondary | ICD-10-CM | POA: Diagnosis not present

## 2023-03-18 DIAGNOSIS — C3492 Malignant neoplasm of unspecified part of left bronchus or lung: Secondary | ICD-10-CM

## 2023-03-18 DIAGNOSIS — Z79899 Other long term (current) drug therapy: Secondary | ICD-10-CM | POA: Insufficient documentation

## 2023-03-18 DIAGNOSIS — Z87891 Personal history of nicotine dependence: Secondary | ICD-10-CM | POA: Insufficient documentation

## 2023-03-18 LAB — RAD ONC ARIA SESSION SUMMARY
Course Elapsed Days: 6
Plan Fractions Treated to Date: 3
Plan Prescribed Dose Per Fraction: 10 Gy
Plan Total Fractions Prescribed: 5
Plan Total Prescribed Dose: 50 Gy
Reference Point Dosage Given to Date: 30 Gy
Reference Point Session Dosage Given: 10 Gy
Session Number: 3

## 2023-03-18 NOTE — Progress Notes (Unsigned)
[facility]  [phone] [phone]  ID: Hannah Yu OB: Dec 01, 1945  MR#: [id number]  AOZ#:[id number]  Patient Care Team: Mick Sell, MD as PCP - General (Infectious Diseases) Glory Buff, RN as Oncology Nurse Navigator Carmina Miller, MD as Consulting Physician (Radiation Oncology)   CHIEF COMPLAINT: Stage IVB adenocarcinoma of the lung  INTERVAL HISTORY: Patient returns to clinic today for further evaluation and discussion of her imaging and biopsy results.  She continues to feel well and remains asymptomatic.  She does not complain of pain today.  She has no neurologic complaints.  She denies any recent fevers.  She has a good appetite and denies weight loss.  She has no chest pain, shortness of breath, cough, or hemoptysis.  She denies any nausea, vomiting, constipation, or diarrhea.  She has no urinary complaints.  Patient offers no specific complaints today.  REVIEW OF SYSTEMS:   Review of Systems  Constitutional: Negative.  Negative for fever, malaise/fatigue and weight loss.  Respiratory: Negative.  Negative for cough, hemoptysis and shortness of breath.   Cardiovascular: Negative.  Negative for chest pain and leg swelling.  Gastrointestinal: Negative.  Negative for abdominal pain.  Genitourinary: Negative.  Negative for dysuria.  Musculoskeletal: Negative.  Negative for back pain.  Skin: Negative.  Negative for rash.  Neurological: Negative.  Negative for dizziness, focal weakness, weakness and headaches.  Psychiatric/Behavioral: Negative.  The patient is not nervous/anxious.     As per HPI. Otherwise, a complete review of systems is negative.  PAST MEDICAL HISTORY: Past Medical History:  Diagnosis Date   Acid reflux    Anemia    Anesthesia complication    Tachycardia previously, now on metoprolol   Arthritis    09/02/2019: per patient "have it real bad in both hands and back"   Difficult intubation    had to  terminate intubation unable to advance tube for cholecystectomy 2017?   DVT (deep venous thrombosis) (HCC)    leg   Hypertension    Sciatica    09/02/2019: has had it for about 3 years    PAST SURGICAL HISTORY: Past Surgical History:  Procedure Laterality Date   ABDOMINAL HYSTERECTOMY     CATARACT EXTRACTION W/ INTRAOCULAR LENS IMPLANT Bilateral 2007   eyes done within 1 month of each other.   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   TOTAL HIP ARTHROPLASTY Right 09/06/2019   Procedure: RIGHT TOTAL HIP ARTHROPLASTY ANTERIOR APPROACH;  Surgeon: Kathryne Hitch, MD;  Location: [facility] OR;  Service: Orthopedics;  Laterality: Right;   TOTAL HIP ARTHROPLASTY Left 07/31/2020   Procedure: LEFT TOTAL HIP ARTHROPLASTY ANTERIOR APPROACH;  Surgeon: Kathryne Hitch, MD;  Location: [facility] OR;  Service: Orthopedics;  Laterality: Left;   VIDEO BRONCHOSCOPY WITH ENDOBRONCHIAL ULTRASOUND N/A 10/06/2022   Procedure: VIDEO BRONCHOSCOPY WITH ENDOBRONCHIAL ULTRASOUND;  Surgeon: Vida Rigger, MD;  Location: [facility] ORS;  Service: Thoracic;  Laterality: N/A;    FAMILY HISTORY: Family History  Problem Relation Age of Onset   Breast cancer Sister 87    ADVANCED DIRECTIVES (Y/N):  N  HEALTH MAINTENANCE: Social History   Tobacco Use   Smoking status: Former    Current packs/day: 0.00    Types: Cigarettes    Quit date: 01/02/1993    Years since quitting: 30.2   Smokeless tobacco: Never  Vaping Use   Vaping status: Never Used  Substance Use Topics   Alcohol use: Yes  Alcohol/week: 4.0 standard drinks of alcohol    Types: 4 Glasses of wine per week    Comment: 09/02/2019: per patient "ever so often"   Drug use: No     Colonoscopy:  PAP:  Bone density:  Lipid panel:  Allergies  Allergen Reactions   Ezetimibe Other (See Comments)    Body aches, and stiffness   Codeine Diarrhea   Cortisone Swelling    injections    Lovastatin Other (See Comments)    Muscle cramps    Current Outpatient Medications  Medication Sig Dispense Refill   amLODipine (NORVASC) 10 MG tablet Take 10 mg by mouth daily.     aspirin EC 81 MG tablet Take 162 mg by mouth daily. Swallow whole.     cholecalciferol (VITAMIN D3) 25 MCG (1000 UNIT) tablet Take 1,000 Units by mouth daily.     FIBER PO Take 1 tablet by mouth daily.     furosemide (LASIX) 40 MG tablet Take 40 mg by mouth daily.     ibuprofen (ADVIL) 200 MG tablet Take 400 mg by mouth every 6 (six) hours as needed for mild pain or moderate pain.     Ibuprofen-diphenhydrAMINE HCl (IBUPROFEN PM) 200-25 MG CAPS Take 2 tablets by mouth at bedtime as needed (sleep).     losartan (COZAAR) 100 MG tablet Take 100 mg by mouth daily.     magnesium oxide (MAG-OX) 400 (240 Mg) MG tablet Take 400 mg by mouth daily.     metoprolol succinate (TOPROL-XL) 50 MG 24 hr tablet Take 50 mg by mouth daily.     omeprazole (PRILOSEC) 40 MG capsule Take 40 mg by mouth daily as needed.     OVER THE COUNTER MEDICATION Take 1 capsule by mouth daily. CBD gummie     Tetrahydrozoline HCl (VISINE OP) Place 1 drop into both eyes daily as needed (dry eyes).     vitamin B-12 (CYANOCOBALAMIN) 1000 MCG tablet Take 1,000 mcg by mouth daily.     cyclobenzaprine (FLEXERIL) 5 MG tablet Take 5 mg by mouth 3 (three) times daily as needed for muscle spasms. (Patient not taking: Reported on 03/11/2023)     No current facility-administered medications for this visit.    OBJECTIVE: Vitals:   03/18/23 1008  BP: 130/78  Pulse: 68  Temp: 98.5 F (36.9 C)      Body mass index is 35.35 kg/m.    ECOG FS:0 - Asymptomatic  General: Well-developed, well-nourished, no acute distress. Eyes: Pink conjunctiva, anicteric sclera. HEENT: Normocephalic, moist mucous membranes. Lungs: No audible wheezing or coughing. Heart: Regular rate and rhythm. Abdomen: Soft, nontender, no obvious distention. Musculoskeletal: No edema,  cyanosis, or clubbing. Neuro: Alert, answering all questions appropriately. Cranial nerves grossly intact. Skin: No rashes or petechiae noted. Psych: Normal affect.  LAB RESULTS:  Lab Results  Component Value Date   NA 134 (L) 08/01/2020   K 3.7 08/01/2020   CL 100 08/01/2020   CO2 28 08/01/2020   GLUCOSE 119 (H) 08/01/2020   BUN 7 (L) 08/01/2020   CREATININE 0.80 01/15/2023   CALCIUM 8.8 (L) 08/01/2020   GFRNONAA >60 08/01/2020   GFRAA >60 09/10/2019    Lab Results  Component Value Date   WBC 8.4 03/11/2023   HGB 15.6 (H) 03/11/2023   HCT 49.2 (H) 03/11/2023   MCV 81.6 03/11/2023   PLT 389 03/11/2023     STUDIES: CT ABDOMINAL MASS BIOPSY  Result Date: 03/11/2023 INDICATION: Left Pelvic lesion on PET scan EXAM: CT-GUIDED CORE  BIOPSY OF A LEFT PELVIC MASS COMPARISON:  PET-CT, 02/06/2023.  CT CAP, 01/15/2023. MEDICATIONS: None. ANESTHESIA/SEDATION: Moderate (conscious) sedation was employed during this procedure. A total of Versed 1 mg and Fentanyl 75 mcg was administered intravenously. Moderate Sedation Time: 12 minutes. The patient's level of consciousness and vital signs were monitored continuously by radiology nursing throughout the procedure under my direct supervision. CONTRAST:  None. COMPLICATIONS: None immediate. PROCEDURE: RADIATION DOSE REDUCTION: This exam was performed according to the departmental dose-optimization program which includes automated exposure control, adjustment of the mA and/or kV according to patient size and/or use of iterative reconstruction technique. Informed consent was obtained from the patient following an explanation of the procedure, risks, benefits and alternatives. A time out was performed prior to the initiation of the procedure. The patient was positioned prone on the CT table and a limited CT was performed for procedural planning demonstrating the LEFT pelvic mass. The procedure was planned. The operative site was prepped and draped in the  usual sterile fashion. Appropriate trajectory was confirmed with a 22 gauge spinal needle after the adjacent tissues were anesthetized with 1% Lidocaine with epinephrine. Under intermittent CT guidance, a 17 gauge coaxial needle was advanced into the peripheral aspect of the mass. Appropriate positioning was confirmed and 3 samples were obtained with an 18 gauge core needle biopsy device. The co-axial needle was removed and hemostasis was achieved with manual compression. A limited postprocedural CT was negative for hemorrhage or additional complication. A dressing was placed. The patient tolerated the procedure well without immediate postprocedural complication. IMPRESSION: Successful CT-guided core needle biopsy of the LEFT pelvic mass. Roanna Banning, MD Vascular and Interventional Radiology Specialists [facility] Radiology Electronically Signed   By: Roanna Banning M.D.   On: 03/11/2023 10:45    ASSESSMENT: Stage IVB adenocarcinoma of the lung  PLAN:    Stage IVB adenocarcinoma of the lung: Patient completed SBRT to the 2.7 cm irregular left lower lobe nodule in July 2024.  PET scan results from February 13, 2023 reviewed independently and reported as above with residual low CT level of activity suggesting treatment response in the left lower lobe.  The AP window lymph node seen on recent CT scan reveals progressive enlargement now measuring 1.7 x 1.3 cm as well as increased hypermetabolic activity.  Patient is actively receiving XRT to this lesion.   Left greater sciatic notch lesion: This is a new hypermetabolic mass measuring 2.0 x 1.3 cm.  Biopsy confirmed adenocarcinoma.  Have referred patient back to radiation oncology for consideration of XRT to this lesion.  In the meantime, will send NGS testing as patient will likely need systemic treatment.   Left acromial lesion: Possibly metastasis.  Will defer to radiation oncology whether treatment is necessary.   Disposition: Return to clinic in 3 weeks for  treatment planning.    I spent a total of 30 minutes reviewing chart data, face-to-face evaluation with the patient, counseling and coordination of care as detailed above.    Patient expressed understanding and was in agreement with this plan. She also understands that She can call clinic at any time with any questions, concerns, or complaints.    Cancer Staging  Adenocarcinoma of left lung [facility]) Staging form: Lung, AJCC 8th Edition - Clinical stage from 03/19/2023: Stage IVB (cTX, cN2, cM1c) - Signed by Jeralyn Ruths, MD on 03/19/2023 Stage prefix: Initial diagnosis   Jeralyn Ruths, MD   03/19/2023 10:30 AM

## 2023-03-18 NOTE — Progress Notes (Signed)
Order for xT, xR, and PDL1 submitted to [facility] on recent pelvis mass biopsy ([id number])

## 2023-03-19 ENCOUNTER — Ambulatory Visit: Payer: Medicare Other

## 2023-03-19 DIAGNOSIS — C3492 Malignant neoplasm of unspecified part of left bronchus or lung: Secondary | ICD-10-CM | POA: Insufficient documentation

## 2023-03-23 ENCOUNTER — Ambulatory Visit
Admission: RE | Admit: 2023-03-23 | Discharge: 2023-03-23 | Disposition: A | Discharge status: Home / Self Care | Payer: Medicare Other | Source: Ambulatory Visit | Attending: Radiation Oncology | Admitting: Radiation Oncology

## 2023-03-23 ENCOUNTER — Other Ambulatory Visit: Payer: Self-pay

## 2023-03-23 DIAGNOSIS — Z510 Encounter for antineoplastic radiation therapy: Secondary | ICD-10-CM | POA: Diagnosis not present

## 2023-03-23 LAB — RAD ONC ARIA SESSION SUMMARY
Course Elapsed Days: 11
Plan Fractions Treated to Date: 4
Plan Prescribed Dose Per Fraction: 10 Gy
Plan Total Fractions Prescribed: 5
Plan Total Prescribed Dose: 50 Gy
Reference Point Dosage Given to Date: 40 Gy
Reference Point Session Dosage Given: 10 Gy
Session Number: 4

## 2023-03-24 ENCOUNTER — Ambulatory Visit: Payer: Medicare Other

## 2023-03-25 ENCOUNTER — Other Ambulatory Visit: Payer: Self-pay

## 2023-03-25 ENCOUNTER — Ambulatory Visit
Admission: RE | Admit: 2023-03-25 | Discharge: 2023-03-25 | Disposition: A | Discharge status: Home / Self Care | Payer: Medicare Other | Source: Ambulatory Visit | Attending: Radiation Oncology | Admitting: Radiation Oncology

## 2023-03-25 DIAGNOSIS — Z510 Encounter for antineoplastic radiation therapy: Secondary | ICD-10-CM | POA: Diagnosis not present

## 2023-03-25 LAB — RAD ONC ARIA SESSION SUMMARY
Course Elapsed Days: 13
Plan Fractions Treated to Date: 5
Plan Prescribed Dose Per Fraction: 10 Gy
Plan Total Fractions Prescribed: 5
Plan Total Prescribed Dose: 50 Gy
Reference Point Dosage Given to Date: 50 Gy
Reference Point Session Dosage Given: 10 Gy
Session Number: 5

## 2023-03-26 ENCOUNTER — Ambulatory Visit: Payer: Medicare Other | Admitting: Radiation Oncology

## 2023-03-26 ENCOUNTER — Ambulatory Visit: Payer: Medicare Other

## 2023-04-01 ENCOUNTER — Other Ambulatory Visit: Payer: Self-pay | Admitting: Radiation Oncology

## 2023-04-01 DIAGNOSIS — C801 Malignant (primary) neoplasm, unspecified: Secondary | ICD-10-CM

## 2023-04-06 ENCOUNTER — Encounter: Payer: Self-pay | Admitting: Oncology

## 2023-04-08 ENCOUNTER — Encounter: Payer: Self-pay | Admitting: Oncology

## 2023-04-08 ENCOUNTER — Inpatient Hospital Stay: Payer: Medicare Other | Attending: Oncology | Admitting: Oncology

## 2023-04-08 VITALS — BP 120/65 | HR 87 | Temp 98.3°F | Resp 16 | Ht 60.0 in | Wt 183.8 lb

## 2023-04-08 DIAGNOSIS — Z87891 Personal history of nicotine dependence: Secondary | ICD-10-CM | POA: Insufficient documentation

## 2023-04-08 DIAGNOSIS — Z79899 Other long term (current) drug therapy: Secondary | ICD-10-CM | POA: Insufficient documentation

## 2023-04-08 DIAGNOSIS — C3492 Malignant neoplasm of unspecified part of left bronchus or lung: Secondary | ICD-10-CM

## 2023-04-08 DIAGNOSIS — Z803 Family history of malignant neoplasm of breast: Secondary | ICD-10-CM | POA: Insufficient documentation

## 2023-04-08 DIAGNOSIS — C3432 Malignant neoplasm of lower lobe, left bronchus or lung: Secondary | ICD-10-CM | POA: Diagnosis present

## 2023-04-08 DIAGNOSIS — Z923 Personal history of irradiation: Secondary | ICD-10-CM | POA: Insufficient documentation

## 2023-04-08 MED ORDER — PREDNISONE 10 MG (21) PO TBPK
ORAL_TABLET | ORAL | 0 refills | Status: DC
Start: 2023-04-08 — End: 2023-05-07

## 2023-04-08 NOTE — Progress Notes (Signed)
States she has concerns about her SOB. Having left shoulder pain that she would also like to address.

## 2023-04-08 NOTE — Progress Notes (Signed)
START OFF PATHWAY REGIMEN - Non-Small Cell Lung   [id number]:Pembrolizumab 200 mg IV D1 q21 Days:   A cycle is every 21 days:     Pembrolizumab   **Always confirm dose/schedule in your pharmacy ordering system**  Patient Characteristics: Stage IV Metastatic, Nonsquamous, Molecular Analysis Completed, Molecular Alteration Present and Targeted Therapy Exhausted OR KRAS G12C+ or HER2+ Present and No Prior Chemo/Immunotherapy OR No Alteration Present, Initial Chemotherapy/Immunotherapy, PS =  0, 1, BRAF/MET/KRAS/HER2 Mutation Positive, Candidate for Immunotherapy, PD-L1 Expression Positive 1-49% (TPS) / Negative / Not Tested / Awaiting Test Results and Immunotherapy Candidate Therapeutic Status: Stage IV Metastatic Histology: Nonsquamous Cell Broad Molecular Profiling Status: Molecular Analysis Completed Molecular Analysis Results: KRAS G12C Mutation Present and No Prior Chemo/Immunotherapy ECOG Performance Status: 0 Chemotherapy/Immunotherapy Line of Therapy: Initial Chemotherapy/Immunotherapy Immunotherapy Candidate Status: Candidate for Immunotherapy PD-L1 Expression Status: PD-L1 Positive 1-49% (TPS) Intent of Therapy: Non-Curative / Palliative Intent, Discussed with Patient

## 2023-04-08 NOTE — Progress Notes (Signed)
[facility]  [phone] [phone]  ID: Hannah Yu OB: 1945-10-25  MR#: [id number]  VQQ#:[id number]  Patient Care Team: Mick Sell, MD as PCP - General (Infectious Diseases) Glory Buff, RN as Oncology Nurse Navigator Carmina Miller, MD as Consulting Physician (Radiation Oncology)   CHIEF COMPLAINT: Stage IVB adenocarcinoma of the lung, PD-L1 5% and K-ras G12C mutated.  INTERVAL HISTORY: Patient returns to clinic today for further evaluation and treatment planning.  She recently finished XRT to her mediastinal lymph node and will begin XRT to her left pelvis.  She has shoulder pain and increased shortness of breath, but otherwise feels well. She has no neurologic complaints.  She denies any recent fevers.  She has a good appetite and denies weight loss.  She has no chest pain, cough, or hemoptysis.  She denies any nausea, vomiting, constipation, or diarrhea.  She has no urinary complaints.  Patient offers no further specific complaints today.  REVIEW OF SYSTEMS:   Review of Systems  Constitutional: Negative.  Negative for fever, malaise/fatigue and weight loss.  Respiratory:  Positive for shortness of breath. Negative for cough and hemoptysis.   Cardiovascular: Negative.  Negative for chest pain and leg swelling.  Gastrointestinal: Negative.  Negative for abdominal pain.  Genitourinary: Negative.  Negative for dysuria.  Musculoskeletal:  Positive for joint pain. Negative for back pain.  Skin: Negative.  Negative for rash.  Neurological: Negative.  Negative for dizziness, focal weakness, weakness and headaches.  Psychiatric/Behavioral: Negative.  The patient is not nervous/anxious.     As per HPI. Otherwise, a complete review of systems is negative.  PAST MEDICAL HISTORY: Past Medical History:  Diagnosis Date   Acid reflux    Anemia    Anesthesia complication    Tachycardia previously, now on metoprolol   Arthritis     09/02/2019: per patient "have it real bad in both hands and back"   Difficult intubation    had to terminate intubation unable to advance tube for cholecystectomy 2017?   DVT (deep venous thrombosis) (HCC)    leg   Hypertension    Sciatica    09/02/2019: has had it for about 3 years    PAST SURGICAL HISTORY: Past Surgical History:  Procedure Laterality Date   ABDOMINAL HYSTERECTOMY     CATARACT EXTRACTION W/ INTRAOCULAR LENS IMPLANT Bilateral 2007   eyes done within 1 month of each other.   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   TOTAL HIP ARTHROPLASTY Right 09/06/2019   Procedure: RIGHT TOTAL HIP ARTHROPLASTY ANTERIOR APPROACH;  Surgeon: Kathryne Hitch, MD;  Location: [facility] OR;  Service: Orthopedics;  Laterality: Right;   TOTAL HIP ARTHROPLASTY Left 07/31/2020   Procedure: LEFT TOTAL HIP ARTHROPLASTY ANTERIOR APPROACH;  Surgeon: Kathryne Hitch, MD;  Location: [facility] OR;  Service: Orthopedics;  Laterality: Left;   VIDEO BRONCHOSCOPY WITH ENDOBRONCHIAL ULTRASOUND N/A 10/06/2022   Procedure: VIDEO BRONCHOSCOPY WITH ENDOBRONCHIAL ULTRASOUND;  Surgeon: Vida Rigger, MD;  Location: [facility] ORS;  Service: Thoracic;  Laterality: N/A;    FAMILY HISTORY: Family History  Problem Relation Age of Onset   Breast cancer Sister 51    ADVANCED DIRECTIVES (Y/N):  N  HEALTH MAINTENANCE: Social History   Tobacco Use   Smoking status: Former    Current packs/day: 0.00    Types: Cigarettes    Quit date: 01/02/1993    Years since quitting: 30.2   Smokeless tobacco: Never  Vaping Use   Vaping status: Never Used  Substance Use Topics   Alcohol use: Yes    Alcohol/week: 4.0 standard drinks of alcohol    Types: 4 Glasses of wine per week    Comment: 09/02/2019: per patient "ever so often"   Drug use: No     Colonoscopy:  PAP:  Bone density:  Lipid panel:  Allergies  Allergen Reactions   Ezetimibe Other (See  Comments)    Body aches, and stiffness   Codeine Diarrhea   Cortisone Swelling    injections   Lovastatin Other (See Comments)    Muscle cramps    Current Outpatient Medications  Medication Sig Dispense Refill   amLODipine (NORVASC) 10 MG tablet Take 10 mg by mouth daily.     aspirin EC 81 MG tablet Take 162 mg by mouth daily. Swallow whole.     cholecalciferol (VITAMIN D3) 25 MCG (1000 UNIT) tablet Take 1,000 Units by mouth daily.     FIBER PO Take 1 tablet by mouth daily.     furosemide (LASIX) 40 MG tablet Take 40 mg by mouth daily.     ibuprofen (ADVIL) 200 MG tablet Take 400 mg by mouth every 6 (six) hours as needed for mild pain or moderate pain.     Ibuprofen-diphenhydrAMINE HCl (IBUPROFEN PM) 200-25 MG CAPS Take 2 tablets by mouth at bedtime as needed (sleep).     losartan (COZAAR) 100 MG tablet Take 100 mg by mouth daily.     magnesium oxide (MAG-OX) 400 (240 Mg) MG tablet Take 400 mg by mouth daily.     metoprolol succinate (TOPROL-XL) 50 MG 24 hr tablet Take 50 mg by mouth daily.     omeprazole (PRILOSEC) 40 MG capsule Take 40 mg by mouth daily as needed.     OVER THE COUNTER MEDICATION Take 1 capsule by mouth daily. CBD gummie     predniSONE (STERAPRED UNI-PAK 21 TAB) 10 MG (21) TBPK tablet Taper as directed 21 tablet 0   Tetrahydrozoline HCl (VISINE OP) Place 1 drop into both eyes daily as needed (dry eyes).     vitamin B-12 (CYANOCOBALAMIN) 1000 MCG tablet Take 1,000 mcg by mouth daily.     cyclobenzaprine (FLEXERIL) 5 MG tablet Take 5 mg by mouth 3 (three) times daily as needed for muscle spasms. (Patient not taking: Reported on 03/11/2023)     No current facility-administered medications for this visit.    OBJECTIVE: Vitals:   04/08/23 0954  BP: 120/65  Pulse: 87  Resp: 16  Temp: 98.3 F (36.8 C)  SpO2: 92%      Body mass index is 35.9 kg/m.    ECOG FS:0 - Asymptomatic  General: Well-developed, well-nourished, no acute distress. Eyes: Pink conjunctiva,  anicteric sclera. HEENT: Normocephalic, moist mucous membranes. Lungs: No audible wheezing or coughing. Heart: Regular rate and rhythm. Abdomen: Soft, nontender, no obvious distention. Musculoskeletal: No edema, cyanosis, or clubbing. Neuro: Alert, answering all questions appropriately. Cranial nerves grossly intact. Skin: No rashes or petechiae noted. Psych: Normal affect.  LAB RESULTS:  Lab Results  Component Value Date   NA 134 (L) 08/01/2020   K 3.7 08/01/2020   CL 100 08/01/2020   CO2 28 08/01/2020   GLUCOSE 119 (H) 08/01/2020   BUN 7 (L) 08/01/2020   CREATININE 0.80 01/15/2023   CALCIUM 8.8 (L) 08/01/2020   GFRNONAA >60 08/01/2020   GFRAA >60 09/10/2019    Lab Results  Component Value Date   WBC 8.4 03/11/2023  HGB 15.6 (H) 03/11/2023   HCT 49.2 (H) 03/11/2023   MCV 81.6 03/11/2023   PLT 389 03/11/2023     STUDIES: CT ABDOMINAL MASS BIOPSY  Result Date: 03/11/2023 INDICATION: Left Pelvic lesion on PET scan EXAM: CT-GUIDED CORE BIOPSY OF A LEFT PELVIC MASS COMPARISON:  PET-CT, 02/06/2023.  CT CAP, 01/15/2023. MEDICATIONS: None. ANESTHESIA/SEDATION: Moderate (conscious) sedation was employed during this procedure. A total of Versed 1 mg and Fentanyl 75 mcg was administered intravenously. Moderate Sedation Time: 12 minutes. The patient's level of consciousness and vital signs were monitored continuously by radiology nursing throughout the procedure under my direct supervision. CONTRAST:  None. COMPLICATIONS: None immediate. PROCEDURE: RADIATION DOSE REDUCTION: This exam was performed according to the departmental dose-optimization program which includes automated exposure control, adjustment of the mA and/or kV according to patient size and/or use of iterative reconstruction technique. Informed consent was obtained from the patient following an explanation of the procedure, risks, benefits and alternatives. A time out was performed prior to the initiation of the procedure.  The patient was positioned prone on the CT table and a limited CT was performed for procedural planning demonstrating the LEFT pelvic mass. The procedure was planned. The operative site was prepped and draped in the usual sterile fashion. Appropriate trajectory was confirmed with a 22 gauge spinal needle after the adjacent tissues were anesthetized with 1% Lidocaine with epinephrine. Under intermittent CT guidance, a 17 gauge coaxial needle was advanced into the peripheral aspect of the mass. Appropriate positioning was confirmed and 3 samples were obtained with an 18 gauge core needle biopsy device. The co-axial needle was removed and hemostasis was achieved with manual compression. A limited postprocedural CT was negative for hemorrhage or additional complication. A dressing was placed. The patient tolerated the procedure well without immediate postprocedural complication. IMPRESSION: Successful CT-guided core needle biopsy of the LEFT pelvic mass. Roanna Banning, MD [facility] Rad[facility] Electronically Signed   By: Roanna Banning M.D.   On: 03/11/2023 10:45    ASSESSMENT: Stage IVB adenocarcinoma of the lung, PD-L1 5% and K-ras G12C mutated.  PLAN:    Stage IVB adenocarcinoma of the lung, PD-L1 5% and K-ras G12C mutated: Patient completed SBRT to the 2.7 cm irregular left lower lobe nodule in July 2024.  PET scan results from February 13, 2023 reviewed independently with residual low CT level of activity suggesting treatment response in the left lower lobe.  The AP window lymph node seen on recent CT scan reveals progressive enlargement now measuring 1.7 x 1.3 cm as well as increased hypermetabolic activity.  Patient completed XRT to this lesion.  Once she completes XRT to her left pelvis, will initiate Keytruda 200 mg every 3 weeks for minimum of 1 year, possibly up to do.  If she is noted to have progressive disease can use at Adagrasib 600 mg twice daily given  her K-ras mutation.  Will repeat imaging in approximately March 2025. Left greater sciatic notch lesion: This is a new hypermetabolic mass measuring 2.0 x 1.3 cm.  Biopsy confirmed adenocarcinoma.  XRT and Keytruda as above.   Left acromial lesion: Possibly metastasis.  Will defer to radiation oncology whether treatment is necessary.  Keytruda as above. Shortness of breath: Possibly radiation pneumonitis.  Patient was given a Medrol Dosepak today. Disposition: Return to clinic on May 07, 2023 to initiate cycle 1 of single agent Keytruda.   Patient expressed understanding and was in agreement with this plan. She also understands that  She can call clinic at any time with any questions, concerns, or complaints.    Cancer Staging  Adenocarcinoma of left lung [facility]) Staging form: Lung, AJCC 8th Edition - Clinical stage from 03/19/2023: Stage IVB (cTX, cN2, cM1c) - Signed by Jeralyn Ruths, MD on 03/19/2023 Stage prefix: Initial diagnosis   Jeralyn Ruths, MD   04/08/2023 12:58 PM

## 2023-04-09 ENCOUNTER — Other Ambulatory Visit: Payer: Self-pay

## 2023-04-09 ENCOUNTER — Ambulatory Visit
Admission: RE | Admit: 2023-04-09 | Discharge: 2023-04-09 | Disposition: A | Discharge status: Home / Self Care | Payer: Medicare Other | Source: Ambulatory Visit | Attending: Radiation Oncology | Admitting: Radiation Oncology

## 2023-04-09 DIAGNOSIS — Z510 Encounter for antineoplastic radiation therapy: Secondary | ICD-10-CM | POA: Insufficient documentation

## 2023-04-09 DIAGNOSIS — Z87891 Personal history of nicotine dependence: Secondary | ICD-10-CM | POA: Insufficient documentation

## 2023-04-09 DIAGNOSIS — R911 Solitary pulmonary nodule: Secondary | ICD-10-CM | POA: Diagnosis present

## 2023-04-09 DIAGNOSIS — C801 Malignant (primary) neoplasm, unspecified: Secondary | ICD-10-CM

## 2023-04-09 DIAGNOSIS — C3432 Malignant neoplasm of lower lobe, left bronchus or lung: Secondary | ICD-10-CM | POA: Insufficient documentation

## 2023-04-09 DIAGNOSIS — Z803 Family history of malignant neoplasm of breast: Secondary | ICD-10-CM | POA: Insufficient documentation

## 2023-04-09 DIAGNOSIS — M948X9 Other specified disorders of cartilage, unspecified sites: Secondary | ICD-10-CM | POA: Insufficient documentation

## 2023-04-09 DIAGNOSIS — Z79899 Other long term (current) drug therapy: Secondary | ICD-10-CM | POA: Diagnosis not present

## 2023-04-09 DIAGNOSIS — G5700 Lesion of sciatic nerve, unspecified lower limb: Secondary | ICD-10-CM | POA: Diagnosis not present

## 2023-04-13 ENCOUNTER — Inpatient Hospital Stay: Payer: Medicare Other

## 2023-04-13 DIAGNOSIS — Z510 Encounter for antineoplastic radiation therapy: Secondary | ICD-10-CM | POA: Diagnosis not present

## 2023-04-20 ENCOUNTER — Other Ambulatory Visit: Payer: Self-pay

## 2023-04-20 ENCOUNTER — Ambulatory Visit
Admission: RE | Admit: 2023-04-20 | Discharge: 2023-04-20 | Disposition: A | Discharge status: Home / Self Care | Payer: Medicare Other | Source: Ambulatory Visit | Attending: Radiation Oncology | Admitting: Radiation Oncology

## 2023-04-20 DIAGNOSIS — Z510 Encounter for antineoplastic radiation therapy: Secondary | ICD-10-CM | POA: Diagnosis not present

## 2023-04-20 LAB — RAD ONC ARIA SESSION SUMMARY
Course Elapsed Days: 0
Plan Fractions Treated to Date: 1
Plan Prescribed Dose Per Fraction: 6 Gy
Plan Total Fractions Prescribed: 5
Plan Total Prescribed Dose: 30 Gy
Reference Point Dosage Given to Date: 6 Gy
Reference Point Session Dosage Given: 6 Gy
Session Number: 1

## 2023-04-22 ENCOUNTER — Ambulatory Visit
Admission: RE | Admit: 2023-04-22 | Discharge: 2023-04-22 | Disposition: A | Discharge status: Home / Self Care | Payer: Medicare Other | Source: Ambulatory Visit | Attending: Radiation Oncology | Admitting: Radiation Oncology

## 2023-04-22 ENCOUNTER — Other Ambulatory Visit: Payer: Self-pay

## 2023-04-22 DIAGNOSIS — Z510 Encounter for antineoplastic radiation therapy: Secondary | ICD-10-CM | POA: Diagnosis not present

## 2023-04-22 LAB — RAD ONC ARIA SESSION SUMMARY
Course Elapsed Days: 2
Plan Fractions Treated to Date: 2
Plan Prescribed Dose Per Fraction: 6 Gy
Plan Total Fractions Prescribed: 5
Plan Total Prescribed Dose: 30 Gy
Reference Point Dosage Given to Date: 12 Gy
Reference Point Session Dosage Given: 6 Gy
Session Number: 2

## 2023-04-23 NOTE — Progress Notes (Signed)
Pharmacist Chemotherapy Monitoring - Initial Assessment    Anticipated start date: 05/07/23   The following has been reviewed per standard work regarding the patient's treatment regimen: The patient's diagnosis, treatment plan and drug doses, and organ/hematologic function Lab orders and baseline tests specific to treatment regimen  The treatment plan start date, drug sequencing, and pre-medications Prior authorization status  Patient's documented medication list, including drug-drug interaction screen and prescriptions for anti-emetics and supportive care specific to the treatment regimen The drug concentrations, fluid compatibility, administration routes, and timing of the medications to be used The patient's access for treatment and lifetime cumulative dose history, if applicable  The patient's medication allergies and previous infusion related reactions, if applicable   Changes made to treatment plan:  N/A  Follow up needed:  N/A   Sharen Hones, PharmD, BCPS Clinical Pharmacist   04/23/2023  3:23 PM

## 2023-04-27 ENCOUNTER — Ambulatory Visit
Admission: RE | Admit: 2023-04-27 | Discharge: 2023-04-27 | Disposition: A | Discharge status: Home / Self Care | Payer: Medicare Other | Source: Ambulatory Visit | Attending: Radiation Oncology | Admitting: Radiation Oncology

## 2023-04-27 ENCOUNTER — Other Ambulatory Visit: Payer: Self-pay

## 2023-04-27 DIAGNOSIS — Z510 Encounter for antineoplastic radiation therapy: Secondary | ICD-10-CM | POA: Diagnosis not present

## 2023-04-27 LAB — RAD ONC ARIA SESSION SUMMARY
Course Elapsed Days: 7
Plan Fractions Treated to Date: 3
Plan Prescribed Dose Per Fraction: 6 Gy
Plan Total Fractions Prescribed: 5
Plan Total Prescribed Dose: 30 Gy
Reference Point Dosage Given to Date: 18 Gy
Reference Point Session Dosage Given: 6 Gy
Session Number: 3

## 2023-04-30 ENCOUNTER — Other Ambulatory Visit: Payer: Self-pay

## 2023-04-30 ENCOUNTER — Ambulatory Visit
Admission: RE | Admit: 2023-04-30 | Discharge: 2023-04-30 | Disposition: A | Discharge status: Home / Self Care | Payer: Medicare Other | Source: Ambulatory Visit | Attending: Radiation Oncology | Admitting: Radiation Oncology

## 2023-04-30 ENCOUNTER — Encounter: Payer: Self-pay | Admitting: Oncology

## 2023-04-30 DIAGNOSIS — Z510 Encounter for antineoplastic radiation therapy: Secondary | ICD-10-CM | POA: Diagnosis not present

## 2023-04-30 LAB — RAD ONC ARIA SESSION SUMMARY
Course Elapsed Days: 10
Plan Fractions Treated to Date: 4
Plan Prescribed Dose Per Fraction: 6 Gy
Plan Total Fractions Prescribed: 5
Plan Total Prescribed Dose: 30 Gy
Reference Point Dosage Given to Date: 24 Gy
Reference Point Session Dosage Given: 6 Gy
Session Number: 4

## 2023-05-04 ENCOUNTER — Other Ambulatory Visit: Payer: Self-pay

## 2023-05-04 ENCOUNTER — Ambulatory Visit
Admission: RE | Admit: 2023-05-04 | Discharge: 2023-05-04 | Disposition: A | Discharge status: Home / Self Care | Payer: Medicare Other | Source: Ambulatory Visit | Attending: Radiation Oncology | Admitting: Radiation Oncology

## 2023-05-04 DIAGNOSIS — Z510 Encounter for antineoplastic radiation therapy: Secondary | ICD-10-CM | POA: Diagnosis not present

## 2023-05-04 LAB — RAD ONC ARIA SESSION SUMMARY
Course Elapsed Days: 14
Plan Fractions Treated to Date: 5
Plan Prescribed Dose Per Fraction: 6 Gy
Plan Total Fractions Prescribed: 5
Plan Total Prescribed Dose: 30 Gy
Reference Point Dosage Given to Date: 30 Gy
Reference Point Session Dosage Given: 6 Gy
Session Number: 5

## 2023-05-05 NOTE — Radiation Completion Notes (Signed)
Patient Name: Hannah Yu, QUILTY MRN: [id number] Date of Birth: 14-Apr-1946 Referring Physician: Clydie Braun, M.D. Date of Service: 2023-05-05 Radiation Oncologist: Carmina Miller, M.D. [facility] [place]                             RADIATION ONCOLOGY END OF TREATMENT NOTE     Diagnosis: C79.51 Secondary malignant neoplasm of bone Staging on 2023-03-19: Adenocarcinoma of left lung (HCC) T=cTX, N=cN2, M=cM1c Intent: Palliative     HPI: Patient is a 77 year old female now out approximately 4 months having pleated SBRT to left upper lobe for a stage I non-small cell lung cancer..  Of the left lower lobe.  Repeat PET CT scan showed marked decrease hypermetabolic activity in the left lower lobe lesion although there was a new left upper lobe hypermetabolic lesion.  She also had a hypermetabolic lesion measuring 2 cm anterior left greater sciatic notch.  There also was a hypermetabolic lesion in the left acromion cannot rule out malignancy.  She continues to be asymptomatic specifically [place] cough hemoptysis or chest tightness.  She is scheduled for biopsy of the sciatic notch lesion.      ==========DELIVERED PLANS==========  First Treatment Date: 2023-04-20 Last Treatment Date: 2023-05-04   Plan Name: Pelvis_SBRT Site: Pelvis Technique: SBRT/SRT-IMRT Mode: Photon Dose Per Fraction: 6 Gy Prescribed Dose (Delivered / Prescribed): 30 Gy / 30 Gy Prescribed Fxs (Delivered / Prescribed): 5 / 5     ==========ON TREATMENT VISIT DATES========== 2023-04-20, 2023-04-22, 2023-04-27, 2023-04-27, 2023-04-30, 2023-05-04     ==========UPCOMING VISITS========== 05/28/2023 [facility] INFUSION 1HR30MIN (90) [facility] INFUSION CHAIR 9  05/28/2023 [facility] EST PT 15 Jeralyn Ruths, MD  05/28/2023 [facility] LAB [facility]  05/25/2023 [facility] ONCOLOGY FOLLOW UP 30 Chrystal, Sherrine Maples, MD  05/07/2023 [facility] INFUSION 1HR30MIN  (90) [facility] INFUSION CHAIR 2  05/07/2023 [facility] EST PT 15 Jeralyn Ruths, MD  05/07/2023 [facility]        ==========APPENDIX - ON TREATMENT VISIT NOTES==========   See weekly On Treatment Notes in Epic for details in the Media tab (listed as Progress notes on the On Treatment Visit Dates listed above).

## 2023-05-07 ENCOUNTER — Inpatient Hospital Stay: Payer: Medicare Other | Admitting: Oncology

## 2023-05-07 ENCOUNTER — Ambulatory Visit
Admission: RE | Admit: 2023-05-07 | Discharge: 2023-05-07 | Disposition: A | Discharge status: Home / Self Care | Payer: Medicare Other | Source: Ambulatory Visit | Attending: Oncology | Admitting: Oncology

## 2023-05-07 ENCOUNTER — Other Ambulatory Visit: Payer: Self-pay | Admitting: *Deleted

## 2023-05-07 ENCOUNTER — Other Ambulatory Visit: Payer: Self-pay | Admitting: Oncology

## 2023-05-07 ENCOUNTER — Encounter: Payer: Self-pay | Admitting: Oncology

## 2023-05-07 ENCOUNTER — Telehealth: Payer: Self-pay | Admitting: *Deleted

## 2023-05-07 ENCOUNTER — Inpatient Hospital Stay: Payer: Medicare Other

## 2023-05-07 ENCOUNTER — Inpatient Hospital Stay: Payer: Medicare Other | Attending: Oncology

## 2023-05-07 VITALS — BP 130/71 | HR 83 | Temp 98.2°F | Resp 18 | Ht 60.0 in | Wt 182.0 lb

## 2023-05-07 VITALS — BP 136/62 | HR 76 | Resp 16

## 2023-05-07 DIAGNOSIS — Z87891 Personal history of nicotine dependence: Secondary | ICD-10-CM | POA: Diagnosis not present

## 2023-05-07 DIAGNOSIS — C3492 Malignant neoplasm of unspecified part of left bronchus or lung: Secondary | ICD-10-CM

## 2023-05-07 DIAGNOSIS — R0602 Shortness of breath: Secondary | ICD-10-CM

## 2023-05-07 DIAGNOSIS — C3432 Malignant neoplasm of lower lobe, left bronchus or lung: Secondary | ICD-10-CM | POA: Diagnosis present

## 2023-05-07 DIAGNOSIS — Z5112 Encounter for antineoplastic immunotherapy: Secondary | ICD-10-CM | POA: Insufficient documentation

## 2023-05-07 DIAGNOSIS — Z79620 Long term (current) use of immunosuppressive biologic: Secondary | ICD-10-CM | POA: Insufficient documentation

## 2023-05-07 LAB — CBC WITH DIFFERENTIAL (CANCER CENTER ONLY)
Abs Immature Granulocytes: 0.07 10*3/uL (ref 0.00–0.07)
Basophils Absolute: 0.1 10*3/uL (ref 0.0–0.1)
Basophils Relative: 1 %
Eosinophils Absolute: 0.3 10*3/uL (ref 0.0–0.5)
Eosinophils Relative: 3 %
HCT: 43.8 % (ref 36.0–46.0)
Hemoglobin: 13.9 g/dL (ref 12.0–15.0)
Immature Granulocytes: 1 %
Lymphocytes Relative: 10 %
Lymphs Abs: 0.8 10*3/uL (ref 0.7–4.0)
MCH: 26.4 pg (ref 26.0–34.0)
MCHC: 31.7 g/dL (ref 30.0–36.0)
MCV: 83.1 fL (ref 80.0–100.0)
Monocytes Absolute: 0.9 10*3/uL (ref 0.1–1.0)
Monocytes Relative: 11 %
Neutro Abs: 6.3 10*3/uL (ref 1.7–7.7)
Neutrophils Relative %: 74 %
Platelet Count: 370 10*3/uL (ref 150–400)
RBC: 5.27 MIL/uL — ABNORMAL HIGH (ref 3.87–5.11)
RDW: 16.7 % — ABNORMAL HIGH (ref 11.5–15.5)
WBC Count: 8.4 10*3/uL (ref 4.0–10.5)
nRBC: 0 % (ref 0.0–0.2)

## 2023-05-07 LAB — CMP (CANCER CENTER ONLY)
ALT: 16 U/L (ref 0–44)
AST: 17 U/L (ref 15–41)
Albumin: 3.9 g/dL (ref 3.5–5.0)
Alkaline Phosphatase: 114 U/L (ref 38–126)
Anion gap: 11 (ref 5–15)
BUN: 13 mg/dL (ref 8–23)
CO2: 24 mmol/L (ref 22–32)
Calcium: 9.2 mg/dL (ref 8.9–10.3)
Chloride: 99 mmol/L (ref 98–111)
Creatinine: 0.67 mg/dL (ref 0.44–1.00)
GFR, Estimated: >60 mL/min (ref >60)
Glucose, Bld: 115 mg/dL — ABNORMAL HIGH (ref 70–99)
Potassium: 4 mmol/L (ref 3.5–5.1)
Sodium: 134 mmol/L — ABNORMAL LOW (ref 135–145)
Total Bilirubin: 0.5 mg/dL (ref 0.0–1.2)
Total Protein: 7.8 g/dL (ref 6.5–8.1)

## 2023-05-07 LAB — TSH: TSH: 1.926 u[IU]/mL (ref 0.350–4.500)

## 2023-05-07 MED ORDER — SODIUM CHLORIDE 0.9 % IV SOLN
200.0000 mg | Freq: Once | INTRAVENOUS | Status: AC
  Administered 2023-05-07: 200 mg via INTRAVENOUS
  Filled 2023-05-07: qty 200

## 2023-05-07 MED ORDER — PREDNISONE 10 MG (21) PO TBPK: ORAL_TABLET | ORAL | 0 refills | Status: DC

## 2023-05-07 MED ORDER — METHYLPREDNISOLONE 4 MG PO TBPK: ORAL_TABLET | ORAL | 0 refills | Status: DC

## 2023-05-07 MED ORDER — SODIUM CHLORIDE 0.9 % IV SOLN
INTRAVENOUS | Status: DC
  Filled 2023-05-07: qty 250

## 2023-05-07 MED ORDER — LIDOCAINE-PRILOCAINE 2.5-2.5 % EX CREA: 1.0000 | TOPICAL_CREAM | CUTANEOUS | 0 refills | Status: DC | PRN

## 2023-05-07 MED ORDER — LIDOCAINE-PRILOCAINE 2.5-2.5 % EX CREA: TOPICAL_CREAM | CUTANEOUS | 1 refills | Status: DC

## 2023-05-07 NOTE — Patient Instructions (Signed)
 [facility] - A DEPT OF [facility]  Discharge Instructions: Thank you for choosing [facility] to provide your oncology and hematology care.  If you have a lab appointment with the Cancer Center, please go directly to the Cancer Center and check in at the registration area.  Wear comfortable clothing and clothing appropriate for easy access to any Portacath or PICC line.   We strive to give you quality time with your provider. You may need to reschedule your appointment if you arrive late (15 or more minutes).  Arriving late affects you and other patients whose appointments are after yours.  Also, if you miss three or more appointments without notifying the office, you may be dismissed from the clinic at the provider's discretion.      For prescription refill requests, have your pharmacy contact our office and allow 72 hours for refills to be completed.    Today you received the following chemotherapy and/or immunotherapy agents Keytruda      To help prevent nausea and vomiting after your treatment, we encourage you to take your nausea medication as directed.  BELOW ARE SYMPTOMS THAT SHOULD BE REPORTED IMMEDIATELY: *FEVER GREATER THAN 100.4 F (38 C) OR HIGHER *CHILLS OR SWEATING *NAUSEA AND VOMITING THAT IS NOT CONTROLLED WITH YOUR NAUSEA MEDICATION *UNUSUAL SHORTNESS OF BREATH *UNUSUAL BRUISING OR BLEEDING *URINARY PROBLEMS (pain or burning when urinating, or frequent urination) *BOWEL PROBLEMS (unusual diarrhea, constipation, pain near the anus) TENDERNESS IN MOUTH AND THROAT WITH OR WITHOUT PRESENCE OF ULCERS (sore throat, sores in mouth, or a toothache) UNUSUAL RASH, SWELLING OR PAIN  UNUSUAL VAGINAL DISCHARGE OR ITCHING   Items with * indicate a potential emergency and should be followed up as soon as possible or go to the Emergency Department if any problems should occur.  Please show the CHEMOTHERAPY ALERT CARD or IMMUNOTHERAPY  ALERT CARD at check-in to the Emergency Department and triage nurse.  Should you have questions after your visit or need to cancel or reschedule your appointment, please contact [facility] - A DEPT OF [facility]  [phone] and follow the prompts.  Office hours are 8:00 a.m. to 4:30 p.m. Monday - Friday. Please note that voicemails left after 4:00 p.m. may not be returned until the following business day.  We are closed weekends and major holidays. You have access to a nurse at all times for urgent questions. Please call the main number to the clinic [phone] and follow the prompts.  For any non-urgent questions, you may also contact your provider using MyChart. We now offer e-Visits for anyone 52 and older to request care online for non-urgent symptoms. For details visit [email or web address].   Also download the MyChart app! Go to the app store, search "MyChart", open the app, select [place], and log in with your MyChart username and password.    Pembrolizumab Injection What is this medication? PEMBROLIZUMAB (PEM broe LIZ ue mab) treats some types of cancer. It works by helping your immune system slow or stop the spread of cancer cells. It is a monoclonal antibody. This medicine may be used for other purposes; ask your health care provider or pharmacist if you have questions. COMMON BRAND NAME(S): Keytruda What should I tell my care team before I take this medication? They need to know if you have any of these conditions: Allogeneic stem cell transplant (uses someone else's stem cells) Autoimmune diseases, such as  Crohn disease, ulcerative colitis, lupus History of chest radiation Nervous system problems, such as Guillain-Barre syndrome, myasthenia gravis Organ transplant An unusual or allergic reaction to pembrolizumab, other medications, foods, dyes, or preservatives Pregnant or trying to get pregnant Breast-feeding How should I use this  medication? This medication is injected into a vein. It is given by your care team in a hospital or clinic setting. A special MedGuide will be given to you before each treatment. Be sure to read this information carefully each time. Talk to your care team about the use of this medication in children. While it may be prescribed for children as young as 6 months for selected conditions, precautions do apply. Overdosage: If you think you have taken too much of this medicine contact a poison control center or emergency room at once. NOTE: This medicine is only for you. Do not share this medicine with others. What if I miss a dose? Keep appointments for follow-up doses. It is important not to miss your dose. Call your care team if you are unable to keep an appointment. What may interact with this medication? Interactions have not been studied. This list may not describe all possible interactions. Give your health care provider a list of all the medicines, herbs, non-prescription drugs, or dietary supplements you use. Also tell them if you smoke, drink alcohol, or use illegal drugs. Some items may interact with your medicine. What should I watch for while using this medication? Your condition will be monitored carefully while you are receiving this medication. You may need blood work while taking this medication. This medication may cause serious skin reactions. They can happen weeks to months after starting the medication. Contact your care team right away if you notice fevers or flu-like symptoms with a rash. The rash may be red or purple and then turn into blisters or peeling of the skin. You may also notice a red rash with swelling of the face, lips, or lymph nodes in your neck or under your arms. Tell your care team right away if you have any change in your eyesight. Talk to your care team if you may be pregnant. Serious birth defects can occur if you take this medication during pregnancy and for 4  months after the last dose. You will need a negative pregnancy test before starting this medication. Contraception is recommended while taking this medication and for 4 months after the last dose. Your care team can help you find the option that works for you. Do not breastfeed while taking this medication and for 4 months after the last dose. What side effects may I notice from receiving this medication? Side effects that you should report to your care team as soon as possible: Allergic reactions--skin rash, itching, hives, swelling of the face, lips, tongue, or throat Dry cough, shortness of breath or trouble breathing Eye pain, redness, irritation, or discharge with blurry or decreased vision Heart muscle inflammation--unusual weakness or fatigue, shortness of breath, chest pain, fast or irregular heartbeat, dizziness, swelling of the ankles, feet, or hands Hormone gland problems--headache, sensitivity to light, unusual weakness or fatigue, dizziness, fast or irregular heartbeat, increased sensitivity to cold or heat, excessive sweating, constipation, hair loss, increased thirst or amount of urine, tremors or shaking, irritability Infusion reactions--chest pain, shortness of breath or trouble breathing, feeling faint or lightheaded Kidney injury (glomerulonephritis)--decrease in the amount of urine, red or dark brown urine, foamy or bubbly urine, swelling of the ankles, hands, or feet Liver injury--right upper belly  pain, loss of appetite, nausea, light-colored stool, dark yellow or brown urine, yellowing skin or eyes, unusual weakness or fatigue Pain, tingling, or numbness in the hands or feet, muscle weakness, change in vision, confusion or trouble speaking, loss of balance or coordination, trouble walking, seizures Rash, fever, and swollen lymph nodes Redness, blistering, peeling, or loosening of the skin, including inside the mouth Sudden or severe stomach pain, bloody diarrhea, fever, nausea,  vomiting Side effects that usually do not require medical attention (report to your care team if they continue or are bothersome): Bone, joint, or muscle pain Diarrhea Fatigue Loss of appetite Nausea Skin rash This list may not describe all possible side effects. Call your doctor for medical advice about side effects. You may report side effects to FDA at 1-800-FDA-1088. Where should I keep my medication? This medication is given in a hospital or clinic. It will not be stored at home. NOTE: This sheet is a summary. It may not cover all possible information. If you have questions about this medicine, talk to your doctor, pharmacist, or health care provider.  2024 Elsevier/Gold Standard (2021-09-03 00:00:00)

## 2023-05-07 NOTE — Telephone Encounter (Signed)
 I called the pharmacy to see the if the pharmacy has the steroid taper and the emla  cream. [facility] sent the meds this afternoon. I checked with Dr. Jacobo to make sure that the steroid is what he wanted and it starts 6 tablet, then 5 tablet, 4 tablets , 3 tablet, 2 tablets and 1 then done. Also she has EMLa  cream was in pharmacy to pick it awesome

## 2023-05-07 NOTE — Progress Notes (Signed)
 States she has been having SOB more than she usually does. Wants to know what she can use or do about her cough.

## 2023-05-07 NOTE — Progress Notes (Signed)
 Pt starting steroid taper today for SOB.  OK to proceed w/ pembrolizumab today per Dr Orlie Dakin.  Ebony Hail, Pharm.D., CPP 05/07/2023@12 :21 PM

## 2023-05-07 NOTE — Addendum Note (Signed)
 Addended by: Luz Lex on: 05/07/2023 03:57 PM   Modules accepted: Orders

## 2023-05-07 NOTE — Progress Notes (Signed)
 [facility]  Telephone:(336) [phone] Fax:([phone]) [phone]  ID: Hannah Yu OB: Mar 01, 1946  MR#: [id number]  RDW#:[id number]  Patient Care Team: Epifanio Alm SQUIBB, MD as PCP - General (Infectious Diseases) Verdene Gills, RN as Oncology Nurse Navigator Lenn Aran, MD as Consulting Physician (Radiation Oncology)   CHIEF COMPLAINT: Stage IVB adenocarcinoma of the lung, PD-L1 5% and K-ras G12C mutated.  INTERVAL HISTORY: Patient returns to clinic today for further evaluation and initiation of Keytruda .  She recently finished XRT to her mediastinal lymph node as well as her left pelvis.  She continues to have chronic shoulder pain.  She has worsening shortness of breath and cough today. She has no neurologic complaints.  She denies any recent fevers.  She has a good appetite and denies weight loss.  She denies any chest pain or hemoptysis.  She denies any nausea, vomiting, constipation, or diarrhea.  She has no urinary complaints.  Patient offers no further specific complaints today.  REVIEW OF SYSTEMS:   Review of Systems  Constitutional: Negative.  Negative for fever, malaise/fatigue and weight loss.  Respiratory:  Positive for cough and shortness of breath. Negative for hemoptysis.   Cardiovascular: Negative.  Negative for chest pain and leg swelling.  Gastrointestinal: Negative.  Negative for abdominal pain.  Genitourinary: Negative.  Negative for dysuria.  Musculoskeletal:  Positive for joint pain. Negative for back pain.  Skin: Negative.  Negative for rash.  Neurological: Negative.  Negative for dizziness, focal weakness, weakness and headaches.  Psychiatric/Behavioral: Negative.  The patient is not nervous/anxious.     As per HPI. Otherwise, a complete review of systems is negative.  PAST MEDICAL HISTORY: Past Medical History:  Diagnosis Date   Acid reflux    Anemia    Anesthesia complication    Tachycardia previously, now on metoprolol     Arthritis    09/02/2019: per patient have it real bad in both hands and back   Difficult intubation    had to terminate intubation unable to advance tube for cholecystectomy 2017?   DVT (deep venous thrombosis) (HCC)    leg   Hypertension    Sciatica    09/02/2019: has had it for about 3 years    PAST SURGICAL HISTORY: Past Surgical History:  Procedure Laterality Date   ABDOMINAL HYSTERECTOMY     CATARACT EXTRACTION W/ INTRAOCULAR LENS IMPLANT Bilateral 2007   eyes done within 1 month of each other.   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   TOTAL HIP ARTHROPLASTY Right 09/06/2019   Procedure: RIGHT TOTAL HIP ARTHROPLASTY ANTERIOR APPROACH;  Surgeon: Vernetta Lonni GRADE, MD;  Location: [facility] OR;  Service: Orthopedics;  Laterality: Right;   TOTAL HIP ARTHROPLASTY Left 07/31/2020   Procedure: LEFT TOTAL HIP ARTHROPLASTY ANTERIOR APPROACH;  Surgeon: Vernetta Lonni GRADE, MD;  Location: [facility] OR;  Service: Orthopedics;  Laterality: Left;   VIDEO BRONCHOSCOPY WITH ENDOBRONCHIAL ULTRASOUND N/A 10/06/2022   Procedure: VIDEO BRONCHOSCOPY WITH ENDOBRONCHIAL ULTRASOUND;  Surgeon: Parris Manna, MD;  Location: [facility] ORS;  Service: Thoracic;  Laterality: N/A;    FAMILY HISTORY: Family History  Problem Relation Age of Onset   Breast cancer Sister 58    ADVANCED DIRECTIVES (Y/N):  N  HEALTH MAINTENANCE: Social History   Tobacco Use   Smoking status: Former    Current packs/day: 0.00    Types: Cigarettes    Quit date: 01/02/1993    Years since quitting: 30.3   Smokeless tobacco:  Never  Vaping Use   Vaping status: Never Used  Substance Use Topics   Alcohol  use: Yes    Alcohol /week: 4.0 standard drinks of alcohol     Types: 4 Glasses of wine per week    Comment: 09/02/2019: per patient ever so often   Drug use: No     Colonoscopy:  PAP:  Bone density:  Lipid panel:  Allergies  Allergen Reactions   Ezetimibe  Other (See Comments)    Body aches, and stiffness   Codeine Diarrhea   Cortisone Swelling    injections   Lovastatin Other (See Comments)    Muscle cramps    Current Outpatient Medications  Medication Sig Dispense Refill   amLODipine  (NORVASC ) 10 MG tablet Take 10 mg by mouth daily.     aspirin  EC 81 MG tablet Take 162 mg by mouth daily. Swallow whole.     cholecalciferol  (VITAMIN D3) 25 MCG (1000 UNIT) tablet Take 1,000 Units by mouth daily.     FIBER PO Take 1 tablet by mouth daily.     furosemide  (LASIX ) 40 MG tablet Take 40 mg by mouth daily.     ibuprofen (ADVIL) 200 MG tablet Take 400 mg by mouth every 6 (six) hours as needed for mild pain or moderate pain.     Ibuprofen-diphenhydrAMINE  HCl (IBUPROFEN PM) 200-25 MG CAPS Take 2 tablets by mouth at bedtime as needed (sleep).     losartan  (COZAAR ) 100 MG tablet Take 100 mg by mouth daily.     magnesium  oxide (MAG-OX) 400 (240 Mg) MG tablet Take 400 mg by mouth daily.     metoprolol  succinate (TOPROL -XL) 50 MG 24 hr tablet Take 50 mg by mouth daily.     omeprazole (PRILOSEC) 40 MG capsule Take 40 mg by mouth daily as needed.     OVER THE COUNTER MEDICATION Take 1 capsule by mouth daily. CBD gummie     predniSONE  (STERAPRED UNI-PAK 21 TAB) 10 MG (21) TBPK tablet Taper as directed 21 tablet 0   Tetrahydrozoline HCl (VISINE OP) Place 1 drop into both eyes daily as needed (dry eyes).     vitamin B-12 (CYANOCOBALAMIN ) 1000 MCG tablet Take 1,000 mcg by mouth daily.     cyclobenzaprine  (FLEXERIL ) 5 MG tablet Take 5 mg by mouth 3 (three) times daily as needed for muscle spasms. (Patient not taking: Reported on 03/11/2023)     No current facility-administered medications for this visit.    OBJECTIVE: Vitals:   05/07/23 1047  BP: 130/71  Pulse: 83  Resp: 18  Temp: 98.2 F (36.8 C)  SpO2: (!) 86%      Body mass index is 35.54 kg/m.    ECOG FS:1 - Symptomatic but completely ambulatory  General: Well-developed, well-nourished, no  acute distress. Eyes: Pink conjunctiva, anicteric sclera. HEENT: Normocephalic, moist mucous membranes. Lungs: No audible wheezing or coughing. Heart: Regular rate and rhythm. Abdomen: Soft, nontender, no obvious distention. Musculoskeletal: No edema, cyanosis, or clubbing. Neuro: Alert, answering all questions appropriately. Cranial nerves grossly intact. Skin: No rashes or petechiae noted. Psych: Normal affect.  LAB RESULTS:  Lab Results  Component Value Date   NA 134 (L) 05/07/2023   K 4.0 05/07/2023   CL 99 05/07/2023   CO2 24 05/07/2023   GLUCOSE 115 (H) 05/07/2023   BUN 13 05/07/2023   CREATININE 0.67 05/07/2023   CALCIUM 9.2 05/07/2023   PROT 7.8 05/07/2023   ALBUMIN  3.9 05/07/2023   AST 17 05/07/2023   ALT 16 05/07/2023  ALKPHOS 114 05/07/2023   BILITOT 0.5 05/07/2023   GFRNONAA >60 05/07/2023   GFRAA >60 09/10/2019    Lab Results  Component Value Date   WBC 8.4 05/07/2023   NEUTROABS 6.3 05/07/2023   HGB 13.9 05/07/2023   HCT 43.8 05/07/2023   MCV 83.1 05/07/2023   PLT 370 05/07/2023     STUDIES: No results found.  ASSESSMENT: Stage IVB adenocarcinoma of the lung, PD-L1 5% and K-ras G12C mutated.  PLAN:    Stage IVB adenocarcinoma of the lung, PD-L1 5% and K-ras G12C mutated: Patient completed SBRT to the 2.7 cm irregular left lower lobe nodule in July 2024.  PET scan results from February 13, 2023 reviewed independently with residual low CT level of activity suggesting treatment response in the left lower lobe.  The AP window lymph node seen on recent CT scan reveals progressive enlargement now measuring 1.7 x 1.3 cm as well as increased hypermetabolic activity.  Patient completed XRT to this lesion.  Patient has also completed XRT to her left pelvis.  Current plan is to proceed with 200 mg Keytruda  every 3 weeks for 1-2 years.  Patient has now requested port placement.  If she is noted to have progressive disease can use at Adagrasib  600 mg twice daily  given her K-ras mutation.  Proceed with cycle 1 of Keytruda  today.  Return to clinic in 3 weeks for further evaluation and consideration of cycle 2.  Will repeat imaging in approximately March 2025. Left greater sciatic notch lesion: This is a new hypermetabolic mass measuring 2.0 x 1.3 cm.  Biopsy confirmed adenocarcinoma.  XRT and Keytruda  as above.   Left acromial lesion: Possibly metastasis. Keytruda  as above. Shortness of breath/cough: Home oxygen has been ordered.  Patient was also given a prescription for Medrol  Dosepak.  Will get a chest x-ray today and refer patient back to pulmonary.    I spent a total of 30 minutes reviewing chart data, face-to-face evaluation with the patient, counseling and coordination of care as detailed above.   Patient expressed understanding and was in agreement with this plan. She also understands that She can call clinic at any time with any questions, concerns, or complaints.    Cancer Staging  Adenocarcinoma of left lung [facility]) Staging form: Lung, AJCC 8th Edition - Clinical stage from 03/19/2023: Stage IVB (cTX, cN2, cM1c) - Signed by Jacobo Evalene PARAS, MD on 03/19/2023 Stage prefix: Initial diagnosis   Evalene PARAS Jacobo, MD   05/07/2023 11:04 AM

## 2023-05-08 ENCOUNTER — Telehealth: Payer: Self-pay

## 2023-05-08 LAB — T4: T4, Total: 9.3 ug/dL (ref 4.5–12.0)

## 2023-05-08 NOTE — Telephone Encounter (Signed)
Telephone call to patient for follow up after receiving first infusion.   No answer but left message stating we were calling to check on them.  Encouraged patient to call for any questions or concerns.   

## 2023-05-10 ENCOUNTER — Other Ambulatory Visit: Payer: Self-pay

## 2023-05-11 ENCOUNTER — Ambulatory Visit: Payer: Medicare Other | Admitting: Radiation Oncology

## 2023-05-15 ENCOUNTER — Encounter: Payer: Self-pay | Admitting: Oncology

## 2023-05-15 ENCOUNTER — Encounter: Payer: Self-pay | Admitting: Radiology

## 2023-05-15 NOTE — H&P (Signed)
 Chief Complaint: Durable IV access for chemotherapy. Request is for portacath placement.   Referring Physician(s): Finnegan,Timothy J  Supervising Physician: Alona Corners  Patient Status: [facility] - Out-pt  History of Present Illness: Hannah Yu is a 78 y.o. female 78 y.o. female. History of HTN, DVT, anemia. Found to have metastatic adenocarcinoma after left sided pelvic bass biopsy performed by IR on 11.6.24. Team is requesting a portacath placement for durable IV access for chemotherapy.   Patient alert and laying in bed,calm. Endorses left shoulder pain she states is chronic in nature. Denies any fevers, headache, chest pain, SOB, cough, abdominal pain, nausea, vomiting or bleeding.    PET Scan from 10.4.24 shows left lower hypermetabolic nodule. RIJ is accessible. Labs from 1.2.25 unremarkable. Patient is on ASA. Allergies include cortisone and codeine.   Return precautions and treatment recommendations and follow-up discussed with the patient  who is agreeable with the plan.    Past Medical History:  Diagnosis Date   Acid reflux    Anemia    Anesthesia complication    Tachycardia previously, now on metoprolol    Arthritis    09/02/2019: per patient have it real bad in both hands and back   Difficult intubation    had to terminate intubation unable to advance tube for cholecystectomy 2017?   DVT (deep venous thrombosis) (HCC)    leg   Hypertension    Sciatica    09/02/2019: has had it for about 3 years    Past Surgical History:  Procedure Laterality Date   ABDOMINAL HYSTERECTOMY     CATARACT EXTRACTION W/ INTRAOCULAR LENS IMPLANT Bilateral 2007   eyes done within 1 month of each other.   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   TOTAL HIP ARTHROPLASTY Right 09/06/2019   Procedure: RIGHT TOTAL HIP ARTHROPLASTY ANTERIOR APPROACH;  Surgeon: Vernetta Lonni GRADE, MD;  Location: [facility] OR;   Service: Orthopedics;  Laterality: Right;   TOTAL HIP ARTHROPLASTY Left 07/31/2020   Procedure: LEFT TOTAL HIP ARTHROPLASTY ANTERIOR APPROACH;  Surgeon: Vernetta Lonni GRADE, MD;  Location: [facility] OR;  Service: Orthopedics;  Laterality: Left;   VIDEO BRONCHOSCOPY WITH ENDOBRONCHIAL ULTRASOUND N/A 10/06/2022   Procedure: VIDEO BRONCHOSCOPY WITH ENDOBRONCHIAL ULTRASOUND;  Surgeon: Parris Manna, MD;  Location: [facility] ORS;  Service: Thoracic;  Laterality: N/A;    Allergies: Ezetimibe, Codeine, Cortisone, and Lovastatin  Medications: Prior to Admission medications   Medication Sig Start Date End Date Taking? Authorizing Provider  amLODipine  (NORVASC ) 10 MG tablet Take 10 mg by mouth daily. 07/15/19   [provider]  aspirin  EC 81 MG tablet Take 162 mg by mouth daily. Swallow whole.    [provider]  cholecalciferol  (VITAMIN D3) 25 MCG (1000 UNIT) tablet Take 1,000 Units by mouth daily.    [provider]  cyclobenzaprine  (FLEXERIL ) 5 MG tablet Take 5 mg by mouth 3 (three) times daily as needed for muscle spasms. Patient not taking: Reported on 03/11/2023    [provider]  FIBER PO Take 1 tablet by mouth daily.    [provider]  furosemide  (LASIX ) 40 MG tablet Take 40 mg by mouth daily.    [provider]  ibuprofen (ADVIL) 200 MG tablet Take 400 mg by mouth every 6 (six) hours as needed for mild pain or moderate pain.    [provider]  Ibuprofen-diphenhydrAMINE  HCl (IBUPROFEN PM) 200-25 MG CAPS Take 2 tablets by mouth at bedtime as  needed (sleep).    [provider]  lidocaine -prilocaine  (EMLA ) cream Apply dime sized amount over the port and apply plastic wrap over it 2 hours prior to port being accessed each time 05/07/23   Jacobo Evalene PARAS, MD  lidocaine -prilocaine  (EMLA ) cream Apply 1 Application topically as needed. 05/07/23   Jacobo Evalene PARAS, MD  losartan  (COZAAR ) 100 MG tablet Take 100 mg by mouth daily. 07/15/19    [provider]  magnesium  oxide (MAG-OX) 400 (240 Mg) MG tablet Take 400 mg by mouth daily.    [provider]  metoprolol  succinate (TOPROL -XL) 50 MG 24 hr tablet Take 50 mg by mouth daily. 06/24/19   [provider]  omeprazole (PRILOSEC) 40 MG capsule Take 40 mg by mouth daily as needed.    [provider]  OVER THE COUNTER MEDICATION Take 1 capsule by mouth daily. CBD gummie    [provider]  predniSONE  (STERAPRED UNI-PAK 21 TAB) 10 MG (21) TBPK tablet Taper as directed 05/07/23   Finnegan, Timothy J, MD  Tetrahydrozoline HCl (VISINE OP) Place 1 drop into both eyes daily as needed (dry eyes).    [provider]  vitamin B-12 (CYANOCOBALAMIN ) 1000 MCG tablet Take 1,000 mcg by mouth daily.    [provider]     Family History  Problem Relation Age of Onset   Breast cancer Sister 81    Social History   Socioeconomic History   Marital status: Single    Spouse name: Not on file   Number of children: Not on file   Years of education: Not on file   Highest education level: Not on file  Occupational History   Not on file  Tobacco Use   Smoking status: Former    Current packs/day: 0.00    Types: Cigarettes    Quit date: 01/02/1993    Years since quitting: 30.3   Smokeless tobacco: Never  Vaping Use   Vaping status: Never Used  Substance and Sexual Activity   Alcohol  use: Yes    Alcohol /week: 4.0 standard drinks of alcohol     Types: 4 Glasses of wine per week    Comment: 09/02/2019: per patient ever so often   Drug use: No   Sexual activity: Not on file  Other Topics Concern   Not on file  Social History Narrative   Not on file   Social Drivers of Health   Financial Resource Strain: Low Risk  (05/08/2023)   Received from [facility]   Overall Financial Resource Strain (CARDIA)    Difficulty of Paying Living Expenses: Not hard at all  Food Insecurity: No Food Insecurity (05/08/2023)   Received  from [facility]   Hunger Vital Sign    Worried About Running Out of Food in the Last Year: Never true    Ran Out of Food in the Last Year: Never true  Transportation Needs: No Transportation Needs (05/08/2023)   Received from [facility] - Transportation    In the past 12 months, has lack of transportation kept you from medical appointments or from getting medications?: No    Lack of Transportation (Non-Medical): No  Physical Activity: Not on file  Stress: Not on file  Social Connections: Unknown (09/17/2021)   Received from [facility]   Social Network    Social Network: Not on file     Review of Systems: A 12 point ROS discussed and pertinent positives are  indicated in the HPI above.  All other systems are negative.  Review of Systems  Constitutional:  Negative for fatigue and fever.  HENT:  Negative for congestion.   Respiratory:  Negative for cough and shortness of breath.   Gastrointestinal:  Negative for abdominal pain, diarrhea, nausea and vomiting.  Musculoskeletal:  Positive for arthralgias (left shoulder pain that is baseline).    Vital Signs: BP 108/65   Pulse 84   Temp 97.6 F (36.4 C) (Oral)   Resp 15   Ht 5' (1.524 m)   Wt 180 lb (81.6 kg)   SpO2 92%   BMI 35.15 kg/m    Physical Exam Vitals and nursing note reviewed.  Constitutional:      Appearance: She is well-developed. She is obese.  HENT:     Head: Normocephalic and atraumatic.     Mouth/Throat:     Mouth: Mucous membranes are dry.  Eyes:     Conjunctiva/sclera: Conjunctivae normal.  Cardiovascular:     Rate and Rhythm: Normal rate and regular rhythm.  Pulmonary:     Effort: Pulmonary effort is normal.  Musculoskeletal:     Cervical back: Normal range of motion.  Neurological:     General: No focal deficit present.     Mental Status: She is alert and oriented to person, place, and time. Mental status is at baseline.   Psychiatric:        Mood and Affect: Mood normal.        Behavior: Behavior normal.        Thought Content: Thought content normal.        Judgment: Judgment normal.     Imaging: DG Chest 2 View Result Date: 05/15/2023 CLINICAL DATA:  Shortness of breath.  History of lung carcinoma. EXAM: CHEST - 2 VIEW COMPARISON:  10/06/2022. FINDINGS: Low lung volume. There is mild diffuse increased interstitial markings, nonspecific. Differential diagnosis includes mild underlying pulmonary edema versus atypical pneumonia. Correlate clinically. There are additional left retrocardiac airspace opacity partially obscuring the central left hemidiaphragm, portion of descending thoracic aorta and blunting the left lateral costophrenic angle, suggesting combination of left lung atelectasis and/or consolidation with pleural effusion. Bilateral lung fields are otherwise clear. No acute consolidation or lung collapse. Right costophrenic angle is clear. Stable cardio-mediastinal silhouette. No acute osseous abnormalities. The soft tissues are within normal limits. IMPRESSION: 1. Mild diffuse increased interstitial markings, which may represent mild underlying pulmonary edema versus atypical pneumonia. Correlate clinically. 2. Left retrocardiac opacity, as described above. In this patient with history of carcinoma, consider cross-sectional examination, unless recently performed. Electronically Signed   By: Ree Molt M.D.   On: 05/15/2023 09:27    Labs:  CBC: Recent Labs    03/11/23 0753 05/07/23 1027  WBC 8.4 8.4  HGB 15.6* 13.9  HCT 49.2* 43.8  PLT 389 370    COAGS: Recent Labs    10/01/22 1026 03/11/23 0753  INR 1.0 1.0  APTT 28  --     BMP: Recent Labs    10/14/22 1700 01/15/23 1350 05/07/23 1027  NA  --   --  134*  K  --   --  4.0  CL  --   --  99  CO2  --   --  24  GLUCOSE  --   --  115*  BUN  --   --  13  CALCIUM  --   --  9.2  CREATININE 1.40* 0.80 0.67  GFRNONAA  --   --  >  60     LIVER FUNCTION TESTS: Recent Labs    05/07/23 1027  BILITOT 0.5  AST 17  ALT 16  ALKPHOS 114  PROT 7.8  ALBUMIN  3.9    Assessment and Plan:  78 y.o. female. History of HTN, DVT, anemia. Found to have metastatic adenocarcinoma after left sided pelvic bass biopsy performed by IR on 11.6.24. Team is requesting a portacath placement for durable IV access for chemotherapy.   PLAN:  IR Image Guided Portacath Placement.   Risks and benefits of image guided port-a-catheter placement was discussed with the patient including, but not limited to bleeding, infection, pneumothorax, or fibrin sheath development and need for additional procedures.  All of the patient's questions were answered, patient is agreeable to proceed. Consent signed and in chart.   Thank you for this interesting consult.  I greatly enjoyed meeting Hannah Yu and look forward to participating in their care.  A copy of this report was sent to the requesting provider on this date.  Electronically Signed: Delon JAYSON Beagle, NP 05/19/2023, 8:14 AM   I spent a total of  30 Minutes   in face to face in clinical consultation, greater than 50% of which was counseling/coordinating care for portacath placement

## 2023-05-18 ENCOUNTER — Telehealth: Payer: Self-pay | Admitting: *Deleted

## 2023-05-18 ENCOUNTER — Other Ambulatory Visit: Payer: Self-pay | Admitting: Radiology

## 2023-05-18 ENCOUNTER — Encounter: Payer: Self-pay | Admitting: Oncology

## 2023-05-18 NOTE — Telephone Encounter (Signed)
 Call placed to patient to follow up on mychart message regarding questions about port placement. Patient verbalized understanding.

## 2023-05-18 NOTE — Progress Notes (Signed)
 Patient for IR Port Insertion on Tues 05/19/2023, I called and spoke with the patient on the phone and gave pre-procedure instructions. Pt was made aware to be here at 7:30a, NPO after MN prior to procedure as well as driver post procedure/recovery/discharge. Pt stated understanding.  Called 05/18/2023

## 2023-05-19 ENCOUNTER — Other Ambulatory Visit: Payer: Self-pay

## 2023-05-19 ENCOUNTER — Encounter: Payer: Self-pay | Admitting: Radiology

## 2023-05-19 ENCOUNTER — Ambulatory Visit
Admission: RE | Admit: 2023-05-19 | Discharge: 2023-05-19 | Disposition: A | Discharge status: Home / Self Care | Payer: Medicare Other | Source: Ambulatory Visit | Attending: Oncology | Admitting: Oncology

## 2023-05-19 DIAGNOSIS — C3492 Malignant neoplasm of unspecified part of left bronchus or lung: Secondary | ICD-10-CM | POA: Insufficient documentation

## 2023-05-19 DIAGNOSIS — I10 Essential (primary) hypertension: Secondary | ICD-10-CM | POA: Diagnosis not present

## 2023-05-19 DIAGNOSIS — Z885 Allergy status to narcotic agent status: Secondary | ICD-10-CM | POA: Insufficient documentation

## 2023-05-19 DIAGNOSIS — Z87891 Personal history of nicotine dependence: Secondary | ICD-10-CM | POA: Diagnosis not present

## 2023-05-19 DIAGNOSIS — Z7982 Long term (current) use of aspirin: Secondary | ICD-10-CM | POA: Insufficient documentation

## 2023-05-19 DIAGNOSIS — D649 Anemia, unspecified: Secondary | ICD-10-CM | POA: Insufficient documentation

## 2023-05-19 DIAGNOSIS — Z86718 Personal history of other venous thrombosis and embolism: Secondary | ICD-10-CM | POA: Insufficient documentation

## 2023-05-19 HISTORY — PX: IR IMAGING GUIDED PORT INSERTION: IMG5740

## 2023-05-19 MED ORDER — LIDOCAINE-EPINEPHRINE 1 %-1:100000 IJ SOLN
INTRAMUSCULAR | Status: AC
  Filled 2023-05-19: qty 1

## 2023-05-19 MED ORDER — HEPARIN SOD (PORK) LOCK FLUSH 100 UNIT/ML IV SOLN
500.0000 [IU] | Freq: Once | INTRAVENOUS | Status: AC
  Administered 2023-05-19: 500 [IU] via INTRAVENOUS

## 2023-05-19 MED ORDER — FENTANYL CITRATE (PF) 100 MCG/2ML IJ SOLN
INTRAMUSCULAR | Status: AC | PRN
  Administered 2023-05-19: 50 ug via INTRAVENOUS

## 2023-05-19 MED ORDER — MIDAZOLAM HCL 2 MG/2ML IJ SOLN
INTRAMUSCULAR | Status: AC
  Filled 2023-05-19: qty 4

## 2023-05-19 MED ORDER — IOHEXOL 300 MG/ML  SOLN
50.0000 mL | Freq: Once | INTRAMUSCULAR | Status: AC | PRN
  Administered 2023-05-19: 4 mL via INTRAVENOUS

## 2023-05-19 MED ORDER — LIDOCAINE-EPINEPHRINE 1 %-1:100000 IJ SOLN
20.0000 mL | Freq: Once | INTRAMUSCULAR | Status: AC
  Administered 2023-05-19: 19 mL

## 2023-05-19 MED ORDER — MIDAZOLAM HCL 2 MG/2ML IJ SOLN
INTRAMUSCULAR | Status: AC | PRN
  Administered 2023-05-19: 1 mg via INTRAVENOUS

## 2023-05-19 MED ORDER — HEPARIN SOD (PORK) LOCK FLUSH 100 UNIT/ML IV SOLN
INTRAVENOUS | Status: AC
  Filled 2023-05-19: qty 5

## 2023-05-19 MED ORDER — FENTANYL CITRATE (PF) 100 MCG/2ML IJ SOLN
INTRAMUSCULAR | Status: AC
  Filled 2023-05-19: qty 2

## 2023-05-19 NOTE — Procedures (Signed)
Interventional Radiology Procedure Note  Procedure: Placement of a right IJ approach single lumen PowerPort.  Tip is positioned at the superior cavoatrial junction and catheter is ready for immediate use.  Complications: None Recommendations:  - Ok to shower tomorrow - Do not submerge for 7 days - Routine line care  - DC 1 hr  Signed,  Dulcy Fanny. Earleen Newport, DO

## 2023-05-25 ENCOUNTER — Encounter: Payer: Self-pay | Admitting: Radiation Oncology

## 2023-05-25 ENCOUNTER — Ambulatory Visit
Admission: RE | Admit: 2023-05-25 | Discharge: 2023-05-25 | Disposition: A | Discharge status: Home / Self Care | Payer: Medicare Other | Source: Ambulatory Visit | Attending: Radiation Oncology | Admitting: Radiation Oncology

## 2023-05-25 VITALS — BP 130/76 | HR 89 | Temp 97.6°F | Resp 12 | Wt 185.0 lb

## 2023-05-25 DIAGNOSIS — C3412 Malignant neoplasm of upper lobe, left bronchus or lung: Secondary | ICD-10-CM | POA: Diagnosis present

## 2023-05-25 NOTE — Progress Notes (Signed)
Radiation Oncology Follow up Note  Name: Hannah Yu   Date:   05/25/2023 MRN:  [id number] DOB: Jan 06, 1946    This 78 y.o. female presents to the clinic today for 1 month follow-up status post SBRT to her pelvis for metastatic.  Stage IVb adenocarcinoma the lung previously treated 2 to lung lesions with SBRT as well as a pelvic metastatic lesion with SBRT now 1 month out from the pelvic treatment.  REFERRING PROVIDER: Mick Sell, MD  HPI: Patient is a 78 year old female now out 1 month having pleated SBRT to her pelvis for metastatic biopsy-proven lesion part of her stage IV adenocarcinoma of the lung.  She is currently on Keytruda therapy.  She does have some slight pain in her left supraclavicular shoulder area although it has been a chronic problem and seems to be exacerbated since her port placement in her right chest.  She specifically [place] cough hemoptysis or chest tightness..  COMPLICATIONS OF TREATMENT: none  FOLLOW UP COMPLIANCE: keeps appointments   PHYSICAL EXAM:  BP 130/76   Pulse 89   Temp 97.6 F (36.4 C)   Resp 12   Wt 185 lb (83.9 kg)   BMI 36.13 kg/m .  The left shoulder does have no palpable mass in the supraclavicular region or lower neck. Well-developed well-nourished patient in NAD. HEENT reveals PERLA, EOMI, discs not visualized.  Oral cavity is clear. No oral mucosal lesions are identified. Neck is clear without evidence of cervical or supraclavicular adenopathy. Lungs are clear to A&P. Cardiac examination is essentially unremarkable with regular rate and rhythm without murmur rub or thrill. Abdomen is benign with no organomegaly or masses noted. Motor sensory and DTR levels are equal and symmetric in the upper and lower extremities. Cranial nerves II through XII are grossly intact. Proprioception is intact. No peripheral adenopathy or edema is identified. No motor or sensory levels are noted. Crude visual fields are within normal range.  RADIOLOGY  RESULTS: No current films to review  PLAN: Present time patient is doing well with low side effect profile from her multiple areas of SBRT treatment.  She continues Keytruda therapy through medical oncology.  She scheduled for PET CT scan in March and I schedule a follow-up after that.  Patient is to call with any concerns.  I would like to take this opportunity to thank you for allowing me to participate in the care of your patient.Carmina Miller, MD

## 2023-05-26 ENCOUNTER — Other Ambulatory Visit: Payer: Self-pay

## 2023-05-28 ENCOUNTER — Inpatient Hospital Stay: Payer: Medicare Other

## 2023-05-28 ENCOUNTER — Encounter: Payer: Self-pay | Admitting: Oncology

## 2023-05-28 ENCOUNTER — Inpatient Hospital Stay: Payer: Medicare Other | Admitting: Oncology

## 2023-05-28 VITALS — BP 123/63 | HR 86 | Temp 99.3°F | Resp 18 | Ht 60.0 in | Wt 183.4 lb

## 2023-05-28 DIAGNOSIS — Z5112 Encounter for antineoplastic immunotherapy: Secondary | ICD-10-CM | POA: Diagnosis not present

## 2023-05-28 DIAGNOSIS — C3492 Malignant neoplasm of unspecified part of left bronchus or lung: Secondary | ICD-10-CM

## 2023-05-28 LAB — CMP (CANCER CENTER ONLY)
ALT: 18 U/L (ref 0–44)
AST: 18 U/L (ref 15–41)
Albumin: 3.9 g/dL (ref 3.5–5.0)
Alkaline Phosphatase: 92 U/L (ref 38–126)
Anion gap: 10 (ref 5–15)
BUN: 12 mg/dL (ref 8–23)
CO2: 25 mmol/L (ref 22–32)
Calcium: 9.2 mg/dL (ref 8.9–10.3)
Chloride: 100 mmol/L (ref 98–111)
Creatinine: 0.71 mg/dL (ref 0.44–1.00)
GFR, Estimated: >60 mL/min (ref >60)
Glucose, Bld: 110 mg/dL — ABNORMAL HIGH (ref 70–99)
Potassium: 3.7 mmol/L (ref 3.5–5.1)
Sodium: 135 mmol/L (ref 135–145)
Total Bilirubin: 0.6 mg/dL (ref 0.0–1.2)
Total Protein: 7.6 g/dL (ref 6.5–8.1)

## 2023-05-28 LAB — CBC WITH DIFFERENTIAL (CANCER CENTER ONLY)
Abs Immature Granulocytes: 0.06 10*3/uL (ref 0.00–0.07)
Basophils Absolute: 0.1 10*3/uL (ref 0.0–0.1)
Basophils Relative: 1 %
Eosinophils Absolute: 0.2 10*3/uL (ref 0.0–0.5)
Eosinophils Relative: 2 %
HCT: 35.7 % — ABNORMAL LOW (ref 36.0–46.0)
Hemoglobin: 11.3 g/dL — ABNORMAL LOW (ref 12.0–15.0)
Immature Granulocytes: 1 %
Lymphocytes Relative: 12 %
Lymphs Abs: 0.9 10*3/uL (ref 0.7–4.0)
MCH: 26.3 pg (ref 26.0–34.0)
MCHC: 31.7 g/dL (ref 30.0–36.0)
MCV: 83.2 fL (ref 80.0–100.0)
Monocytes Absolute: 0.8 10*3/uL (ref 0.1–1.0)
Monocytes Relative: 11 %
Neutro Abs: 5.7 10*3/uL (ref 1.7–7.7)
Neutrophils Relative %: 73 %
Platelet Count: 445 10*3/uL — ABNORMAL HIGH (ref 150–400)
RBC: 4.29 MIL/uL (ref 3.87–5.11)
RDW: 15.7 % — ABNORMAL HIGH (ref 11.5–15.5)
WBC Count: 7.8 10*3/uL (ref 4.0–10.5)
nRBC: 0 % (ref 0.0–0.2)

## 2023-05-28 MED ORDER — HEPARIN SOD (PORK) LOCK FLUSH 100 UNIT/ML IV SOLN
500.0000 [IU] | Freq: Once | INTRAVENOUS | Status: AC | PRN
  Administered 2023-05-28: 500 [IU]
  Filled 2023-05-28: qty 5

## 2023-05-28 MED ORDER — SODIUM CHLORIDE 0.9 % IV SOLN
INTRAVENOUS | Status: DC
Start: 2023-05-28 — End: 2023-05-28
  Filled 2023-05-28 (×2): qty 250

## 2023-05-28 MED ORDER — SODIUM CHLORIDE 0.9 % IV SOLN
200.0000 mg | Freq: Once | INTRAVENOUS | Status: AC
  Administered 2023-05-28: 200 mg via INTRAVENOUS
  Filled 2023-05-28: qty 200

## 2023-05-28 NOTE — Patient Instructions (Signed)

## 2023-05-28 NOTE — Progress Notes (Signed)
[facility]  [phone] [phone]  ID: Hannah Yu OB: Sep 25, 1945  MR#: [id number]  IRJ#:[id number]  Patient Care Team: Mick Sell, MD as PCP - General (Infectious Diseases) Glory Buff, RN as Oncology Nurse Navigator Carmina Miller, MD as Consulting Physician (Radiation Oncology) Jeralyn Ruths, MD as Consulting Physician (Oncology)   CHIEF COMPLAINT: Stage IVB adenocarcinoma of the lung, PD-L1 5% and K-ras G12C mutated.  INTERVAL HISTORY: Patient returns to clinic today for further evaluation and consideration of cycle 2 of Keytruda.  She has now completed her XRT.  She continues to have chronic left shoulder pain which appears to be musculoskeletal, but otherwise feels well.  She also had recent port placement.  She tolerated her first infusion without significant side effects.  Her cough and shortness of breath have resolved.  She has no neurologic complaints.  She denies any recent fevers.  She has a good appetite and denies weight loss.  She denies any chest pain or hemoptysis.  She denies any nausea, vomiting, constipation, or diarrhea.  She has no urinary complaints.  Patient offers no further specific complaints today.  REVIEW OF SYSTEMS:   Review of Systems  Constitutional: Negative.  Negative for fever, malaise/fatigue and weight loss.  Respiratory: Negative.  Negative for cough, hemoptysis and shortness of breath.   Cardiovascular: Negative.  Negative for chest pain and leg swelling.  Gastrointestinal: Negative.  Negative for abdominal pain.  Genitourinary: Negative.  Negative for dysuria.  Musculoskeletal:  Positive for joint pain. Negative for back pain.  Skin: Negative.  Negative for rash.  Neurological: Negative.  Negative for dizziness, focal weakness, weakness and headaches.  Psychiatric/Behavioral: Negative.  The patient is not nervous/anxious.     As per HPI. Otherwise, a complete review of systems is  negative.  PAST MEDICAL HISTORY: Past Medical History:  Diagnosis Date   Acid reflux    Anemia    Anesthesia complication    Tachycardia previously, now on metoprolol   Arthritis    09/02/2019: per patient "have it real bad in both hands and back"   Difficult intubation    had to terminate intubation unable to advance tube for cholecystectomy 2017?   DVT (deep venous thrombosis) (HCC)    leg   Hypertension    Sciatica    09/02/2019: has had it for about 3 years    PAST SURGICAL HISTORY: Past Surgical History:  Procedure Laterality Date   ABDOMINAL HYSTERECTOMY     CATARACT EXTRACTION W/ INTRAOCULAR LENS IMPLANT Bilateral 2007   eyes done within 1 month of each other.   IR IMAGING GUIDED PORT INSERTION  05/19/2023   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   TOTAL HIP ARTHROPLASTY Right 09/06/2019   Procedure: RIGHT TOTAL HIP ARTHROPLASTY ANTERIOR APPROACH;  Surgeon: Kathryne Hitch, MD;  Location: [facility] OR;  Service: Orthopedics;  Laterality: Right;   TOTAL HIP ARTHROPLASTY Left 07/31/2020   Procedure: LEFT TOTAL HIP ARTHROPLASTY ANTERIOR APPROACH;  Surgeon: Kathryne Hitch, MD;  Location: [facility] OR;  Service: Orthopedics;  Laterality: Left;   VIDEO BRONCHOSCOPY WITH ENDOBRONCHIAL ULTRASOUND N/A 10/06/2022   Procedure: VIDEO BRONCHOSCOPY WITH ENDOBRONCHIAL ULTRASOUND;  Surgeon: Vida Rigger, MD;  Location: [facility] ORS;  Service: Thoracic;  Laterality: N/A;    FAMILY HISTORY: Family History  Problem Relation Age of Onset   Breast cancer Sister 2    ADVANCED DIRECTIVES (Y/N):  N  HEALTH MAINTENANCE: Social History  Tobacco Use   Smoking status: Former    Current packs/day: 0.00    Types: Cigarettes    Quit date: 01/02/1993    Years since quitting: 30.4   Smokeless tobacco: Never  Vaping Use   Vaping status: Never Used  Substance Use Topics   Alcohol use: Yes    Alcohol/week: 4.0 standard drinks  of alcohol    Types: 4 Glasses of wine per week    Comment: 09/02/2019: per patient "ever so often"   Drug use: No     Colonoscopy:  PAP:  Bone density:  Lipid panel:  Allergies  Allergen Reactions   Ezetimibe Other (See Comments)    Body aches, and stiffness   Codeine Diarrhea   Cortisone Swelling    injections   Lovastatin Other (See Comments)    Muscle cramps    Current Outpatient Medications  Medication Sig Dispense Refill   amLODipine (NORVASC) 10 MG tablet Take 10 mg by mouth daily.     aspirin EC 81 MG tablet Take 162 mg by mouth daily. Swallow whole.     cholecalciferol (VITAMIN D3) 25 MCG (1000 UNIT) tablet Take 1,000 Units by mouth daily.     FIBER PO Take 1 tablet by mouth daily.     furosemide (LASIX) 40 MG tablet Take 40 mg by mouth daily.     ibuprofen (ADVIL) 200 MG tablet Take 400 mg by mouth every 6 (six) hours as needed for mild pain or moderate pain.     Ibuprofen-diphenhydrAMINE HCl (IBUPROFEN PM) 200-25 MG CAPS Take 2 tablets by mouth at bedtime as needed (sleep).     lidocaine-prilocaine (EMLA) cream Apply dime sized amount over the port and apply plastic wrap over it 2 hours prior to port being accessed each time 30 g 1   lidocaine-prilocaine (EMLA) cream Apply 1 Application topically as needed. 30 g 0   losartan (COZAAR) 100 MG tablet Take 100 mg by mouth daily.     magnesium oxide (MAG-OX) 400 (240 Mg) MG tablet Take 400 mg by mouth daily.     metoprolol succinate (TOPROL-XL) 50 MG 24 hr tablet Take 50 mg by mouth daily.     omeprazole (PRILOSEC) 40 MG capsule Take 40 mg by mouth daily as needed.     ondansetron (ZOFRAN-ODT) 4 MG disintegrating tablet Take 4 mg by mouth every 8 (eight) hours as needed.     OVER THE COUNTER MEDICATION Take 1 capsule by mouth daily. CBD gummie     Tetrahydrozoline HCl (VISINE OP) Place 1 drop into both eyes daily as needed (dry eyes).     vitamin B-12 (CYANOCOBALAMIN) 1000 MCG tablet Take 1,000 mcg by mouth daily.      No current facility-administered medications for this visit.   Facility-Administered Medications Ordered in Other Visits  Medication Dose Route Frequency Provider Last Rate Last Admin   0.9 %  sodium chloride infusion   Intravenous Continuous Jeralyn Ruths, MD 10 mL/hr at 05/28/23 0954 New Bag at 05/28/23 0954   pembrolizumab (KEYTRUDA) 200 mg in sodium chloride 0.9 % 50 mL chemo infusion  200 mg Intravenous Once Jeralyn Ruths, MD        OBJECTIVE: Vitals:   05/28/23 0917  BP: 123/63  Pulse: 86  Resp: 18  Temp: 99.3 F (37.4 C)  SpO2: 96%      Body mass index is 35.82 kg/m.    ECOG FS:1 - Symptomatic but completely ambulatory  General: Well-developed, well-nourished, no acute distress. Eyes:  Pink conjunctiva, anicteric sclera. HEENT: Normocephalic, moist mucous membranes. Lungs: No audible wheezing or coughing. Heart: Regular rate and rhythm. Abdomen: Soft, nontender, no obvious distention. Musculoskeletal: No edema, cyanosis, or clubbing. Neuro: Alert, answering all questions appropriately. Cranial nerves grossly intact. Skin: No rashes or petechiae noted. Psych: Normal affect.  LAB RESULTS:  Lab Results  Component Value Date   NA 135 05/28/2023   K 3.7 05/28/2023   CL 100 05/28/2023   CO2 25 05/28/2023   GLUCOSE 110 (H) 05/28/2023   BUN 12 05/28/2023   CREATININE 0.71 05/28/2023   CALCIUM 9.2 05/28/2023   PROT 7.6 05/28/2023   ALBUMIN 3.9 05/28/2023   AST 18 05/28/2023   ALT 18 05/28/2023   ALKPHOS 92 05/28/2023   BILITOT 0.6 05/28/2023   GFRNONAA >60 05/28/2023   GFRAA >60 09/10/2019    Lab Results  Component Value Date   WBC 7.8 05/28/2023   NEUTROABS 5.7 05/28/2023   HGB 11.3 (L) 05/28/2023   HCT 35.7 (L) 05/28/2023   MCV 83.2 05/28/2023   PLT 445 (H) 05/28/2023     STUDIES: IR IMAGING GUIDED PORT INSERTION Result Date: 05/19/2023 INDICATION: 78 year old female referred for port catheter EXAM: IMAGE GUIDED PORT CATHETER  MEDICATIONS: None ANESTHESIA/SEDATION: Moderate (conscious) sedation was employed during this procedure. A total of Versed 1 mg and Fentanyl 50 mcg was administered intravenously. Moderate Sedation Time: 27 minutes. The patient's level of consciousness and vital signs were monitored continuously by radiology nursing throughout the procedure under my direct supervision. FLUOROSCOPY TIME:  Fluoroscopy Time:   (19 mGy). COMPLICATIONS: None PROCEDURE: Informed written consent was obtained from the patient after a discussion of the risks, benefits, and alternatives to treatment. Questions regarding the procedure were encouraged and answered. The right neck and chest were prepped with chlorhexidine in a sterile fashion, and a sterile drape was applied covering the operative field. Maximum barrier sterile technique with sterile gowns and gloves were used for the procedure. A timeout was performed prior to the initiation of the procedure. Ultrasound survey was performed with images stored and sent to PACs. Right internal jugular vein is small caliber, though documented to be patent. The right neck and chest was prepped with chlorhexidine, and draped in the usual sterile fashion using maximum barrier technique (cap and mask, sterile gown, sterile gloves, large sterile sheet, hand hygiene and cutaneous antiseptic). Local anesthesia was attained by infiltration with 1% lidocaine without epinephrine. Under real-time ultrasound guidance, small caliber right internal jugular vein was accessed with a 21 gauge micropuncture needle and image documentation was performed. 018 wire passed into the jugular vein though did not pass centrally into the SVC. A small dermatotomy was made at the access site with an 11 scalpel. The access needle exchanged for a 62F micropuncture vascular sheath. Once the sheath was in place we attempted to pass a Nitrex wire centrally which did not pass. Small amount of contrast was then administered,  identifying that the catheter was extraluminal. Catheter was removed. Ultrasound survey was then again performed with ultrasound guidance used to puncture the right internal jugular vein. On this attempt, the 018 wire passed centrally into the SVC. Dermatotomy was performed with 11 blade scalpel. The 4 French micropuncture vascular sheath was advanced on the wire. A 0.018" wire was advanced into the SVC and used to estimate the length of the internal catheter. An appropriate location for the subcutaneous reservoir was selected below the clavicle and an incision was made through the skin and underlying soft tissues.  1% lidocaine was used for local anesthesia at the port site. The subcutaneous tissues were then dissected using a combination of blunt and sharp surgical technique and a pocket was formed. A single lumen power injectable portacatheter was then tunneled through the subcutaneous tissues from the pocket to the dermatotomy and the port reservoir placed within the subcutaneous pocket. The venous access site was then serially dilated and a peel away vascular sheath placed over the wire. The wire was removed and the port catheter advanced into position under fluoroscopic guidance. The catheter tip is positioned in the cavoatrial junction. This was documented with a spot image. The portacatheter was then tested and found to flush and aspirate well. The port was flushed with saline followed by 100 units/mL heparinized saline. The pocket was then closed in two layers using first subdermal inverted interrupted absorbable sutures followed by a running subcuticular suture. The epidermis was then sealed with Dermabond. The dermatotomy at the venous access site was also seal with Dermabond. Patient tolerated the procedure well and remained hemodynamically stable throughout. No complications encountered and no significant blood loss encountered IMPRESSION: Status post image guided right IJ port catheter placement.  Signed, Yvone Neu. Miachel Roux, RPVI [facility] Electronically Signed   By: Gilmer Mor D.O.   On: 05/19/2023 11:01   DG Chest 2 View Result Date: 05/15/2023 CLINICAL DATA:  Shortness of breath.  History of lung carcinoma. EXAM: CHEST - 2 VIEW COMPARISON:  10/06/2022. FINDINGS: Low lung volume. There is mild diffuse increased interstitial markings, nonspecific. Differential diagnosis includes mild underlying pulmonary edema versus atypical pneumonia. Correlate clinically. There are additional left retrocardiac airspace opacity partially obscuring the central left hemidiaphragm, portion of descending thoracic aorta and blunting the left lateral costophrenic angle, suggesting combination of left lung atelectasis and/or consolidation with pleural effusion. Bilateral lung fields are otherwise clear. No acute consolidation or lung collapse. Right costophrenic angle is clear. Stable cardio-mediastinal silhouette. No acute osseous abnormalities. The soft tissues are within normal limits. IMPRESSION: 1. Mild diffuse increased interstitial markings, which may represent mild underlying pulmonary edema versus atypical pneumonia. Correlate clinically. 2. Left retrocardiac opacity, as described above. In this patient with history of carcinoma, consider cross-sectional examination, unless recently performed. Electronically Signed   By: Jules Schick M.D.   On: 05/15/2023 09:27    ASSESSMENT: Stage IVB adenocarcinoma of the lung, PD-L1 5% and K-ras G12C mutated.  PLAN:    Stage IVB adenocarcinoma of the lung, PD-L1 5% and K-ras G12C mutated: Patient completed SBRT to the 2.7 cm irregular left lower lobe nodule in July 2024.  PET scan results from February 13, 2023 reviewed independently with residual low CT level of activity suggesting treatment response in the left lower lobe.  The AP window lymph node seen on recent CT scan reveals progressive enlargement  now measuring 1.7 x 1.3 cm as well as increased hypermetabolic activity.  Patient completed XRT to this lesion.  Patient has also completed XRT to her left pelvis.  Current plan is to proceed with 200 mg Keytruda every 3 weeks for 1-2 years.  Patient has had port placement.  If she is noted to have progressive disease can use at Adagrasib 600 mg twice daily given her K-ras mutation.  Proceed with cycle 2 of Keytruda today.  Return to clinic in 3 weeks for further evaluation and consideration of cycle 3.  Will repeat imaging in approximately March 2025. Left greater sciatic notch lesion: This is a  new hypermetabolic mass measuring 2.0 x 1.3 cm.  Biopsy confirmed adenocarcinoma.  XRT and Keytruda as above.   Left acromial lesion: Possibly metastasis. Keytruda as above. Shortness of breath/cough: Resolved.  Patient has an appointment with pulmonary today. Left shoulder pain: Appears to be musculoskeletal.  Patient was given a referral to occupational therapy.   I spent a total of 30 minutes reviewing chart data, face-to-face evaluation with the patient, counseling and coordination of care as detailed above.   Patient expressed understanding and was in agreement with this plan. She also understands that She can call clinic at any time with any questions, concerns, or complaints.    Cancer Staging  Adenocarcinoma of left lung [facility]) Staging form: Lung, AJCC 8th Edition - Clinical stage from 03/19/2023: Stage IVB (cTX, cN2, cM1c) - Signed by Jeralyn Ruths, MD on 03/19/2023 Stage prefix: Initial diagnosis   Jeralyn Ruths, MD   05/28/2023 10:04 AM

## 2023-05-28 NOTE — Patient Instructions (Signed)

## 2023-06-10 ENCOUNTER — Inpatient Hospital Stay: Payer: Medicare Other | Attending: Oncology | Admitting: Occupational Therapy

## 2023-06-10 DIAGNOSIS — Z87891 Personal history of nicotine dependence: Secondary | ICD-10-CM | POA: Insufficient documentation

## 2023-06-10 DIAGNOSIS — Z79899 Other long term (current) drug therapy: Secondary | ICD-10-CM | POA: Insufficient documentation

## 2023-06-10 DIAGNOSIS — Z5112 Encounter for antineoplastic immunotherapy: Secondary | ICD-10-CM | POA: Insufficient documentation

## 2023-06-10 DIAGNOSIS — C3432 Malignant neoplasm of lower lobe, left bronchus or lung: Secondary | ICD-10-CM | POA: Insufficient documentation

## 2023-06-10 DIAGNOSIS — R509 Fever, unspecified: Secondary | ICD-10-CM | POA: Insufficient documentation

## 2023-06-10 DIAGNOSIS — R7989 Other specified abnormal findings of blood chemistry: Secondary | ICD-10-CM | POA: Insufficient documentation

## 2023-06-10 DIAGNOSIS — M25512 Pain in left shoulder: Secondary | ICD-10-CM

## 2023-06-10 NOTE — Therapy (Signed)
 [facility] - A Dept Of [place]. [facility] [address], [address] [place], [place], [address] Phone: [phone]   Fax:  [phone]  Occupational Therapy Screen  Patient Details  Name: Hannah Yu MRN: [id number] Date of Birth: [birth year] No data recorded  Encounter Date: 06/10/2023   OT End of Session - 06/10/23 1310     Visit Number 0             Past Medical History:  Diagnosis Date   Acid reflux    Anemia    Anesthesia complication    Tachycardia previously, now on metoprolol    Arthritis    09/02/2019: per patient have it real bad in both hands and back   Difficult intubation    had to terminate intubation unable to advance tube for cholecystectomy 2017?   DVT (deep venous thrombosis) (HCC)    leg   Hypertension    Sciatica    09/02/2019: has had it for about 3 years    Past Surgical History:  Procedure Laterality Date   ABDOMINAL HYSTERECTOMY     CATARACT EXTRACTION W/ INTRAOCULAR LENS IMPLANT Bilateral 2007   eyes done within 1 month of each other.   IR IMAGING GUIDED PORT INSERTION  05/19/2023   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   TOTAL HIP ARTHROPLASTY Right 09/06/2019   Procedure: RIGHT TOTAL HIP ARTHROPLASTY ANTERIOR APPROACH;  Surgeon: Vernetta Lonni GRADE, MD;  Location: [facility] OR;  Service: Orthopedics;  Laterality: Right;   TOTAL HIP ARTHROPLASTY Left 07/31/2020   Procedure: LEFT TOTAL HIP ARTHROPLASTY ANTERIOR APPROACH;  Surgeon: Vernetta Lonni GRADE, MD;  Location: [facility] OR;  Service: Orthopedics;  Laterality: Left;   VIDEO BRONCHOSCOPY WITH ENDOBRONCHIAL ULTRASOUND N/A 10/06/2022   Procedure: VIDEO BRONCHOSCOPY WITH ENDOBRONCHIAL ULTRASOUND;  Surgeon: Parris Manna, MD;  Location: [facility] ORS;  Service: Thoracic;  Laterality: N/A;    There were no vitals filed for this visit.   Subjective Assessment - 06/10/23 1309      Subjective  I pulled my chair in a Christmas.  And felt a pull of my shoulder.  Since then my shoulder has been hurting when I am reaching up.  Or moving certain ways.    Pain Location Shoulder    Pain Orientation Left    Pain Descriptors / Indicators Aching;Tender                   DR Jacobo 05/28/23 ASSESSMENT: Stage IVB adenocarcinoma of the lung, PD-L1 5% and K-ras G12C mutated.   PLAN:     Stage IVB adenocarcinoma of the lung, PD-L1 5% and K-ras G12C mutated: Patient completed SBRT to the 2.7 cm irregular left lower lobe nodule in July 2024.  PET scan results from February 13, 2023 reviewed independently with residual low CT level of activity suggesting treatment response in the left lower lobe.  The AP window lymph node seen on recent CT scan reveals progressive enlargement now measuring 1.7 x 1.3 cm as well as increased hypermetabolic activity.  Patient completed XRT to this lesion.  Patient has also completed XRT to her left pelvis.  Current plan is to proceed with 200 mg Keytruda  every 3 weeks for 1-2 years.  Patient has had port placement.  If she is noted to have progressive disease can use at Adagrasib  600 mg twice daily given her K-ras mutation.  Proceed with cycle 2 of  Keytruda  today.  Return to clinic in 3 weeks for further evaluation and consideration of cycle 3.  Will repeat imaging in approximately March 2025. Left greater sciatic notch lesion: This is a new hypermetabolic mass measuring 2.0 x 1.3 cm.  Biopsy confirmed adenocarcinoma.  XRT and Keytruda  as above.   Left acromial lesion: Possibly metastasis. Keytruda  as above. Shortness of breath/cough: Resolved.  Patient has an appointment with pulmonary today. Left shoulder pain: Appears to be musculoskeletal.  Patient was given a referral to occupational therapy.   OT SCREEN 06/10/23: Patient referred by Dr. Jacobo for left shoulder pain. Patient report pain occurred after pulling her chair under her in at Christmas  time. Continues to have pain with motion since then. Patient pain increased with shoulder abduction, flexion and internal rotation. Patient pain is in the shoulder joint. Tenderness over the acromion Explained to patient it could be just arthritis.  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 Because of last scan showed-lesion possible metastasis.-Will not recommend any joint mobs or range of motion at this time. Patient seen Dr. Sharrie at [place] [facility] in the past. Referred her to Dr. Sharrie for possible x-ray-and physical therapy afterwards if indicated.                                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   Unilateral primary osteoarthritis, left hip 04/25/2020   Status post total replacement of right hip 09/06/2019   Pelvic pain in female 07/20/2019   Hyperlipidemia 07/20/2019   B12 deficiency 07/20/2019   Neuropathic pain of left hand 06/23/2017   Spinal stenosis of lumbosacral region 11/11/2016   Primary osteoarthritis of right hip 11/11/2016   Obesity (BMI 30.0-34.9) 09/12/2016   H/O supraventricular tachycardia 01/14/2016   Hypertension 12/24/2015   Back pain with right-sided sciatica 12/24/2015   Gallstone 10/19/2015   Biliary colic 10/19/2015   Diverticulitis 06/28/2014    Ancel Peters, OTR/L,CLT 06/10/2023, 1:11 PM  [facility] - A Dept Of [place]. [facility] [address] [place], [place], [address] Phone: [phone]   Fax:  [phone]  Name: Hannah Yu MRN: [id number] Date of Birth: 1945/08/04

## 2023-06-12 ENCOUNTER — Telehealth: Payer: Self-pay | Admitting: *Deleted

## 2023-06-12 NOTE — Telephone Encounter (Signed)
 The patient was told on the last visit that he was going to start every other to see her but she would still the come in and get treatment.  The patient called and said that she did not see his name with the 2/13 appointment.  I spoke to Dr. And again and he said he just started that with to 13 that she does go straight to infusion and get the treatment and then the next time he will see her.  If the patient wanted him to be seen on the 13th then she can give us  a call back and we could set it up for her

## 2023-06-18 ENCOUNTER — Ambulatory Visit
Admission: RE | Admit: 2023-06-18 | Discharge: 2023-06-18 | Disposition: A | Discharge status: Home / Self Care | Payer: Medicare Other | Source: Ambulatory Visit | Attending: Hospice and Palliative Medicine

## 2023-06-18 ENCOUNTER — Ambulatory Visit
Admission: RE | Admit: 2023-06-18 | Discharge: 2023-06-18 | Disposition: A | Discharge status: Home / Self Care | Payer: Medicare Other | Attending: Hospice and Palliative Medicine | Admitting: Hospice and Palliative Medicine

## 2023-06-18 ENCOUNTER — Inpatient Hospital Stay (HOSPITAL_BASED_OUTPATIENT_CLINIC_OR_DEPARTMENT_OTHER): Payer: Medicare Other | Admitting: Hospice and Palliative Medicine

## 2023-06-18 ENCOUNTER — Inpatient Hospital Stay: Payer: Medicare Other

## 2023-06-18 ENCOUNTER — Other Ambulatory Visit: Payer: Self-pay | Admitting: Oncology

## 2023-06-18 VITALS — BP 124/59 | HR 93 | Temp 101.2°F | Resp 18

## 2023-06-18 DIAGNOSIS — Z95828 Presence of other vascular implants and grafts: Secondary | ICD-10-CM

## 2023-06-18 DIAGNOSIS — R509 Fever, unspecified: Secondary | ICD-10-CM | POA: Diagnosis not present

## 2023-06-18 DIAGNOSIS — Z79899 Other long term (current) drug therapy: Secondary | ICD-10-CM | POA: Diagnosis not present

## 2023-06-18 DIAGNOSIS — C3432 Malignant neoplasm of lower lobe, left bronchus or lung: Secondary | ICD-10-CM

## 2023-06-18 DIAGNOSIS — Z87891 Personal history of nicotine dependence: Secondary | ICD-10-CM | POA: Diagnosis not present

## 2023-06-18 DIAGNOSIS — C3492 Malignant neoplasm of unspecified part of left bronchus or lung: Secondary | ICD-10-CM

## 2023-06-18 DIAGNOSIS — Z5112 Encounter for antineoplastic immunotherapy: Secondary | ICD-10-CM | POA: Diagnosis present

## 2023-06-18 DIAGNOSIS — R7989 Other specified abnormal findings of blood chemistry: Secondary | ICD-10-CM | POA: Diagnosis not present

## 2023-06-18 LAB — CMP (CANCER CENTER ONLY)
ALT: 29 U/L (ref 0–44)
AST: 19 U/L (ref 15–41)
Albumin: 3.1 g/dL — ABNORMAL LOW (ref 3.5–5.0)
Alkaline Phosphatase: 95 U/L (ref 38–126)
Anion gap: 9 (ref 5–15)
BUN: 20 mg/dL (ref 8–23)
CO2: 24 mmol/L (ref 22–32)
Calcium: 8.4 mg/dL — ABNORMAL LOW (ref 8.9–10.3)
Chloride: 100 mmol/L (ref 98–111)
Creatinine: 1.14 mg/dL — ABNORMAL HIGH (ref 0.44–1.00)
GFR, Estimated: 49 mL/min — ABNORMAL LOW (ref >60)
Glucose, Bld: 126 mg/dL — ABNORMAL HIGH (ref 70–99)
Potassium: 4 mmol/L (ref 3.5–5.1)
Sodium: 133 mmol/L — ABNORMAL LOW (ref 135–145)
Total Bilirubin: 0.6 mg/dL (ref 0.0–1.2)
Total Protein: 6.8 g/dL (ref 6.5–8.1)

## 2023-06-18 LAB — RESPIRATORY PANEL BY PCR

## 2023-06-18 LAB — CBC WITH DIFFERENTIAL (CANCER CENTER ONLY)
Abs Immature Granulocytes: 0.16 10*3/uL — ABNORMAL HIGH (ref 0.00–0.07)
Basophils Absolute: 0 10*3/uL (ref 0.0–0.1)
Basophils Relative: 0 %
Eosinophils Absolute: 0.2 10*3/uL (ref 0.0–0.5)
Eosinophils Relative: 1 %
HCT: 34.1 % — ABNORMAL LOW (ref 36.0–46.0)
Hemoglobin: 11 g/dL — ABNORMAL LOW (ref 12.0–15.0)
Immature Granulocytes: 2 %
Lymphocytes Relative: 7 %
Lymphs Abs: 0.8 10*3/uL (ref 0.7–4.0)
MCH: 26.6 pg (ref 26.0–34.0)
MCHC: 32.3 g/dL (ref 30.0–36.0)
MCV: 82.6 fL (ref 80.0–100.0)
Monocytes Absolute: 0.6 10*3/uL (ref 0.1–1.0)
Monocytes Relative: 5 %
Neutro Abs: 9.1 10*3/uL — ABNORMAL HIGH (ref 1.7–7.7)
Neutrophils Relative %: 85 %
Platelet Count: 235 10*3/uL (ref 150–400)
RBC: 4.13 MIL/uL (ref 3.87–5.11)
RDW: 16.5 % — ABNORMAL HIGH (ref 11.5–15.5)
WBC Count: 10.8 10*3/uL — ABNORMAL HIGH (ref 4.0–10.5)
nRBC: 0 % (ref 0.0–0.2)

## 2023-06-18 LAB — URINALYSIS, COMPLETE (UACMP) WITH MICROSCOPIC
Bilirubin Urine: NEGATIVE
Glucose, UA: NEGATIVE mg/dL
Ketones, ur: NEGATIVE mg/dL
Nitrite: NEGATIVE
Protein, ur: NEGATIVE mg/dL
Specific Gravity, Urine: 1.008 (ref 1.005–1.030)
pH: 5 (ref 5.0–8.0)

## 2023-06-18 MED ORDER — SODIUM CHLORIDE 0.9% FLUSH
10.0000 mL | INTRAVENOUS | Status: DC | PRN
  Administered 2023-06-18: 10 mL via INTRAVENOUS
  Filled 2023-06-18: qty 10

## 2023-06-18 MED ORDER — SODIUM CHLORIDE 0.9 % IV SOLN
Freq: Once | INTRAVENOUS | Status: AC
  Filled 2023-06-18: qty 250

## 2023-06-18 MED ORDER — HEPARIN SOD (PORK) LOCK FLUSH 100 UNIT/ML IV SOLN
500.0000 [IU] | Freq: Once | INTRAVENOUS | Status: AC
Start: 2023-06-18 — End: 2023-06-18
  Administered 2023-06-18: 500 [IU] via INTRAVENOUS
  Filled 2023-06-18: qty 5

## 2023-06-18 NOTE — Patient Instructions (Signed)
[facility] [place] Address: [address], [place], [place] [address] Phone: [phone]

## 2023-06-18 NOTE — Progress Notes (Signed)
Symptom Management Clinic [facility] at [facility] Telephone:(336) [phone] Fax:(336) [phone]  Patient Care Team: Mick Sell, MD as PCP - General (Infectious Diseases) Glory Buff, RN as Oncology Nurse Navigator Carmina Miller, MD as Consulting Physician (Radiation Oncology) Jeralyn Ruths, MD as Consulting Physician (Oncology)   NAME OF PATIENT: Hannah Yu  [id number]  28-Dec-1945   DATE OF VISIT: 06/18/23  REASON FOR CONSULT: Hannah Yu is a 78 y.o. female with multiple medical problems including stage IVb adenocarcinoma of the lung.  She is status post SBRT to the pelvis.  Currently on Keytruda.  INTERVAL HISTORY: Patient presents to clinic today for cycle 3 Keytruda.  However, was found to be febrile with oral temp of 101.2.  Was added on to [facility] schedule to evaluate.  Today, patient reports that she is felt somewhat fatigued but did not realize that she had a fever.  Denies chills or muscle aches.  Denies cough, congestion, shortness of breath.  No sore throat or rhinorrhea.  Denies nausea, vomiting, diarrhea, or constipation.  Denies dysuria or urinary frequency or urgency.  Denies any symptomatic complaints or concerns.  No known sick contacts.  Denies any neurologic complaints.  Denies any easy bleeding or bruising. Reports good appetite and denies weight loss. Denies chest pain. Patient offers no further specific complaints today.   PAST MEDICAL HISTORY: Past Medical History:  Diagnosis Date   Acid reflux    Anemia    Anesthesia complication    Tachycardia previously, now on metoprolol   Arthritis    09/02/2019: per patient "have it real bad in both hands and back"   Difficult intubation    had to terminate intubation unable to advance tube for cholecystectomy 2017?   DVT (deep venous thrombosis) (HCC)    leg   Hypertension    Sciatica    09/02/2019: has had it for about 3 years    PAST SURGICAL HISTORY:  Past  Surgical History:  Procedure Laterality Date   ABDOMINAL HYSTERECTOMY     CATARACT EXTRACTION W/ INTRAOCULAR LENS IMPLANT Bilateral 2007   eyes done within 1 month of each other.   IR IMAGING GUIDED PORT INSERTION  05/19/2023   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   TOTAL HIP ARTHROPLASTY Right 09/06/2019   Procedure: RIGHT TOTAL HIP ARTHROPLASTY ANTERIOR APPROACH;  Surgeon: Kathryne Hitch, MD;  [facility]: [facility];  Service: Orthopedics;  Laterality: Right;   TOTAL HIP ARTHROPLASTY Left 07/31/2020   Procedure: LEFT TOTAL HIP ARTHROPLASTY ANTERIOR APPROACH;  Surgeon: Kathryne Hitch, MD;  Location: [facility] OR;  Service: Orthopedics;  Laterality: Left;   VIDEO BRONCHOSCOPY WITH ENDOBRONCHIAL ULTRASOUND N/A 10/06/2022   Procedure: VIDEO BRONCHOSCOPY WITH ENDOBRONCHIAL ULTRASOUND;  Surgeon: Vida Rigger, MD;  Location: [facility] ORS;  Service: Thoracic;  Laterality: N/A;    HEMATOLOGY/ONCOLOGY HISTORY:  Oncology History  Adenocarcinoma of left lung (HCC)  03/19/2023 Initial Diagnosis   Adenocarcinoma of left lung (HCC)   03/19/2023 Cancer Staging   Staging form: Lung, AJCC 8th Edition - Clinical stage from 03/19/2023: Stage IVB (cTX, cN2, cM1c) - Signed by Jeralyn Ruths, MD on 03/19/2023 Stage prefix: Initial diagnosis   05/07/2023 -  Chemotherapy   Patient is on Treatment Plan : LUNG NSCLC Pembrolizumab (200) q21d       ALLERGIES:  is allergic to ezetimibe, codeine, cortisone, and lovastatin.  MEDICATIONS:  Current Outpatient Medications  Medication Sig Dispense Refill  amLODipine (NORVASC) 10 MG tablet Take 10 mg by mouth daily.     aspirin EC 81 MG tablet Take 162 mg by mouth daily. Swallow whole.     cholecalciferol (VITAMIN D3) 25 MCG (1000 UNIT) tablet Take 1,000 Units by mouth daily.     FIBER PO Take 1 tablet by mouth daily.     furosemide (LASIX) 40 MG tablet Take 40 mg by mouth daily.      ibuprofen (ADVIL) 200 MG tablet Take 400 mg by mouth every 6 (six) hours as needed for mild pain or moderate pain.     Ibuprofen-diphenhydrAMINE HCl (IBUPROFEN PM) 200-25 MG CAPS Take 2 tablets by mouth at bedtime as needed (sleep).     lidocaine-prilocaine (EMLA) cream Apply dime sized amount over the port and apply plastic wrap over it 2 hours prior to port being accessed each time 30 g 1   lidocaine-prilocaine (EMLA) cream Apply 1 Application topically as needed. 30 g 0   losartan (COZAAR) 100 MG tablet Take 100 mg by mouth daily.     magnesium oxide (MAG-OX) 400 (240 Mg) MG tablet Take 400 mg by mouth daily.     metoprolol succinate (TOPROL-XL) 50 MG 24 hr tablet Take 50 mg by mouth daily.     omeprazole (PRILOSEC) 40 MG capsule Take 40 mg by mouth daily as needed.     ondansetron (ZOFRAN-ODT) 4 MG disintegrating tablet Take 4 mg by mouth every 8 (eight) hours as needed.     OVER THE COUNTER MEDICATION Take 1 capsule by mouth daily. CBD gummie     Tetrahydrozoline HCl (VISINE OP) Place 1 drop into both eyes daily as needed (dry eyes).     vitamin B-12 (CYANOCOBALAMIN) 1000 MCG tablet Take 1,000 mcg by mouth daily.     No current facility-administered medications for this visit.    VITAL SIGNS: There were no vitals taken for this visit. There were no vitals filed for this visit.  Estimated body mass index is 35.82 kg/m as calculated from the following:   Height as of 05/28/23: 5' (1.524 m).   Weight as of 05/28/23: 183 lb 6.4 oz (83.2 kg).  LABS: CBC:    Component Value Date/Time   WBC 10.8 (H) 06/18/2023 1319   WBC 8.4 03/11/2023 0753   HGB 11.0 (L) 06/18/2023 1319   HCT 34.1 (L) 06/18/2023 1319   PLT 235 06/18/2023 1319   MCV 82.6 06/18/2023 1319   NEUTROABS 9.1 (H) 06/18/2023 1319   LYMPHSABS 0.8 06/18/2023 1319   MONOABS 0.6 06/18/2023 1319   EOSABS 0.2 06/18/2023 1319   BASOSABS 0.0 06/18/2023 1319   Comprehensive Metabolic Panel:    Component Value Date/Time   NA 133  (L) 06/18/2023 1319   K 4.0 06/18/2023 1319   CL 100 06/18/2023 1319   CO2 24 06/18/2023 1319   BUN 20 06/18/2023 1319   CREATININE 1.14 (H) 06/18/2023 1319   GLUCOSE 126 (H) 06/18/2023 1319   CALCIUM 8.4 (L) 06/18/2023 1319   AST 19 06/18/2023 1319   ALT 29 06/18/2023 1319   ALKPHOS 95 06/18/2023 1319   BILITOT 0.6 06/18/2023 1319   PROT 6.8 06/18/2023 1319   ALBUMIN 3.1 (L) 06/18/2023 1319    RADIOGRAPHIC STUDIES: No results found.  PERFORMANCE STATUS (ECOG) : 0 - Asymptomatic  Review of Systems Unless otherwise noted, a complete review of systems is negative.  Physical Exam General: NAD Cardiovascular: regular rate and rhythm Pulmonary: clear anterior/posterior fields Abdomen: soft, nontender, + bowel  sounds GU: no suprapubic tenderness Extremities: no edema, no joint deformities Skin: no rashes Neurological: nonfocal  IMPRESSION/PLAN: Stage IV NSCLC -on Keytruda  Fever -unclear etiology. Doubt tumor fever.  Suspect early viral syndrome. Note opacities on recent chest x-ray concerning for possible atypical infection.  Patient does not have any respiratory symptoms today.  She is otherwise asymptomatic and nontoxic-appearing. Discussed with Dr. Orlie Dakin and will repeat chest x-ray and obtain viral respiratory panel in addition to blood cultures and UA/urine culture.  Will proceed with gentle fluids today given slight worsening of serum creatinine.  Case and plan discussed with Dr. Orlie Dakin.  ED/urgent care triggers reviewed in detail with patient.  Patient expressed understanding and was in agreement with this plan. She also understands that She can call clinic at any time with any questions, concerns, or complaints.   Thank you for allowing me to participate in the care of this very pleasant patient.   Time Total: 20 minutes  Visit consisted of counseling and education dealing with the complex and emotionally intense issues of symptom management in the setting of  serious illness.Greater than 50%  of this time was spent counseling and coordinating care related to the above assessment and plan.  Signed by: Laurette Schimke, PhD, NP-C

## 2023-06-19 ENCOUNTER — Telehealth: Payer: Self-pay

## 2023-06-19 MED ORDER — AMOXICILLIN-POT CLAVULANATE 875-125 MG PO TABS
1.0000 | ORAL_TABLET | Freq: Two times a day (BID) | ORAL | 0 refills | Status: DC
Start: 2023-06-19 — End: 2023-07-21

## 2023-06-19 NOTE — Addendum Note (Signed)
Addended by: Malachy Moan on: 06/19/2023 09:24 AM   Modules accepted: Orders

## 2023-06-19 NOTE — Telephone Encounter (Signed)
Per [employer], called patient to inform chest xray showed pneumonia in the lower lobe. Advised that rx for Augmentin was sent to [facility] pharmacy. Advised to start taking 1 tablet bid for 10 days. Patient informed that Dr. Orlie Dakin deferred tx for 1 week. Patient verbalized understanding.

## 2023-06-20 LAB — URINE CULTURE: Culture: >=100000 — AB

## 2023-06-22 ENCOUNTER — Telehealth: Payer: Self-pay

## 2023-06-22 NOTE — Telephone Encounter (Signed)
I called patient to ask how she was feeling after being recently having pneumonia.  Patient states she feels a lot better and she is still taking the antibiotics( Augmentin). Patient states everything is going fine with everything else and will see [place] Thursday to get her Keytruda.

## 2023-06-23 ENCOUNTER — Telehealth: Payer: Self-pay | Admitting: *Deleted

## 2023-06-23 LAB — CULTURE, BLOOD (ROUTINE X 2)
Culture: NO GROWTH
Special Requests: ADEQUATE

## 2023-06-23 NOTE — Telephone Encounter (Signed)
Patient wants a call back from [facility] about her temperature and diarrhea. I asked her if she had tried imodium and she says that she was told to not to take meds unless the MD tells her to.

## 2023-06-25 ENCOUNTER — Inpatient Hospital Stay: Payer: Medicare Other

## 2023-06-25 VITALS — BP 113/53 | HR 73 | Temp 97.2°F | Resp 18 | Wt 180.4 lb

## 2023-06-25 DIAGNOSIS — C3492 Malignant neoplasm of unspecified part of left bronchus or lung: Secondary | ICD-10-CM

## 2023-06-25 DIAGNOSIS — Z5112 Encounter for antineoplastic immunotherapy: Secondary | ICD-10-CM | POA: Diagnosis not present

## 2023-06-25 MED ORDER — HEPARIN SOD (PORK) LOCK FLUSH 100 UNIT/ML IV SOLN
500.0000 [IU] | Freq: Once | INTRAVENOUS | Status: AC | PRN
  Administered 2023-06-25: 500 [IU]
  Filled 2023-06-25: qty 5

## 2023-06-25 MED ORDER — SODIUM CHLORIDE 0.9 % IV SOLN
INTRAVENOUS | Status: DC
  Filled 2023-06-25: qty 250

## 2023-06-25 MED ORDER — SODIUM CHLORIDE 0.9 % IV SOLN
200.0000 mg | Freq: Once | INTRAVENOUS | Status: AC
  Administered 2023-06-25: 200 mg via INTRAVENOUS
  Filled 2023-06-25: qty 8

## 2023-06-25 NOTE — Patient Instructions (Signed)
 [facility] - A DEPT OF [facility]  Discharge Instructions: Thank you for choosing [facility] to provide your oncology and hematology care.  If you have a lab appointment with the Cancer Center, please go directly to the Cancer Center and check in at the registration area.  Wear comfortable clothing and clothing appropriate for easy access to any Portacath or PICC line.   We strive to give you quality time with your provider. You may need to reschedule your appointment if you arrive late (15 or more minutes).  Arriving late affects you and other patients whose appointments are after yours.  Also, if you miss three or more appointments without notifying the office, you may be dismissed from the clinic at the provider's discretion.      For prescription refill requests, have your pharmacy contact our office and allow 72 hours for refills to be completed.    Today you received the following chemotherapy and/or immunotherapy agents Hannah Yu      To help prevent nausea and vomiting after your treatment, we encourage you to take your nausea medication as directed.  BELOW ARE SYMPTOMS THAT SHOULD BE REPORTED IMMEDIATELY: *FEVER GREATER THAN 100.4 F (38 C) OR HIGHER *CHILLS OR SWEATING *NAUSEA AND VOMITING THAT IS NOT CONTROLLED WITH YOUR NAUSEA MEDICATION *UNUSUAL SHORTNESS OF BREATH *UNUSUAL BRUISING OR BLEEDING *URINARY PROBLEMS (pain or burning when urinating, or frequent urination) *BOWEL PROBLEMS (unusual diarrhea, constipation, pain near the anus) TENDERNESS IN MOUTH AND THROAT WITH OR WITHOUT PRESENCE OF ULCERS (sore throat, sores in mouth, or a toothache) UNUSUAL RASH, SWELLING OR PAIN  UNUSUAL VAGINAL DISCHARGE OR ITCHING   Items with * indicate a potential emergency and should be followed up as soon as possible or go to the Emergency Department if any problems should occur.  Please show the CHEMOTHERAPY ALERT CARD or IMMUNOTHERAPY  ALERT CARD at check-in to the Emergency Department and triage nurse.  Should you have questions after your visit or need to cancel or reschedule your appointment, please contact [facility] - A DEPT OF [facility]  [phone] and follow the prompts.  Office hours are 8:00 a.m. to 4:30 p.m. Monday - Friday. Please note that voicemails left after 4:00 p.m. may not be returned until the following business day.  We are closed weekends and major holidays. You have access to a nurse at all times for urgent questions. Please call the main number to the clinic [phone] and follow the prompts.  For any non-urgent questions, you may also contact your provider using MyChart. We now offer e-Visits for anyone 48 and older to request care online for non-urgent symptoms. For details visit [email or web address].   Also download the MyChart app! Go to the app store, search "MyChart", open the app, select [place], and log in with your MyChart username and password.

## 2023-07-01 NOTE — Addendum Note (Signed)
 Encounter addended by: Edward Qualia on: 07/01/2023 12:13 PM  Actions taken: Imaging Exam ended

## 2023-07-07 ENCOUNTER — Encounter: Payer: Self-pay | Admitting: Oncology

## 2023-07-07 ENCOUNTER — Other Ambulatory Visit: Payer: Self-pay | Admitting: *Deleted

## 2023-07-07 MED ORDER — BENZONATATE 100 MG PO CAPS: 100.0000 mg | ORAL_CAPSULE | Freq: Three times a day (TID) | ORAL | 0 refills | Status: DC | PRN

## 2023-07-09 ENCOUNTER — Other Ambulatory Visit: Payer: Medicare Other

## 2023-07-09 ENCOUNTER — Ambulatory Visit: Payer: Medicare Other

## 2023-07-09 ENCOUNTER — Ambulatory Visit: Payer: Medicare Other | Admitting: Oncology

## 2023-07-16 ENCOUNTER — Other Ambulatory Visit: Payer: Self-pay | Admitting: Oncology

## 2023-07-16 ENCOUNTER — Encounter: Payer: Self-pay | Admitting: Oncology

## 2023-07-16 ENCOUNTER — Inpatient Hospital Stay: Payer: Medicare Other | Attending: Oncology

## 2023-07-16 ENCOUNTER — Inpatient Hospital Stay (HOSPITAL_BASED_OUTPATIENT_CLINIC_OR_DEPARTMENT_OTHER): Payer: Medicare Other | Admitting: Oncology

## 2023-07-16 ENCOUNTER — Inpatient Hospital Stay: Payer: Medicare Other

## 2023-07-16 ENCOUNTER — Other Ambulatory Visit: Payer: Self-pay | Admitting: *Deleted

## 2023-07-16 ENCOUNTER — Ambulatory Visit
Admission: RE | Admit: 2023-07-16 | Discharge: 2023-07-16 | Disposition: A | Discharge status: Home / Self Care | Source: Ambulatory Visit | Attending: Oncology | Admitting: Oncology

## 2023-07-16 VITALS — BP 138/69 | HR 72 | Temp 97.7°F | Resp 18 | Ht 60.0 in | Wt 181.0 lb

## 2023-07-16 VITALS — BP 138/59 | HR 70 | Temp 96.3°F | Resp 19

## 2023-07-16 DIAGNOSIS — C3492 Malignant neoplasm of unspecified part of left bronchus or lung: Secondary | ICD-10-CM

## 2023-07-16 DIAGNOSIS — Z95828 Presence of other vascular implants and grafts: Secondary | ICD-10-CM

## 2023-07-16 DIAGNOSIS — R9389 Abnormal findings on diagnostic imaging of other specified body structures: Secondary | ICD-10-CM

## 2023-07-16 DIAGNOSIS — R0602 Shortness of breath: Secondary | ICD-10-CM

## 2023-07-16 DIAGNOSIS — C3432 Malignant neoplasm of lower lobe, left bronchus or lung: Secondary | ICD-10-CM | POA: Insufficient documentation

## 2023-07-16 DIAGNOSIS — Z5112 Encounter for antineoplastic immunotherapy: Secondary | ICD-10-CM | POA: Diagnosis present

## 2023-07-16 DIAGNOSIS — Z79620 Long term (current) use of immunosuppressive biologic: Secondary | ICD-10-CM | POA: Diagnosis not present

## 2023-07-16 DIAGNOSIS — Z87891 Personal history of nicotine dependence: Secondary | ICD-10-CM | POA: Insufficient documentation

## 2023-07-16 LAB — CBC WITH DIFFERENTIAL (CANCER CENTER ONLY)
Abs Immature Granulocytes: 0.1 10*3/uL — ABNORMAL HIGH (ref 0.00–0.07)
Basophils Absolute: 0.1 10*3/uL (ref 0.0–0.1)
Basophils Relative: 1 %
Eosinophils Absolute: 0.4 10*3/uL (ref 0.0–0.5)
Eosinophils Relative: 3 %
HCT: 36.3 % (ref 36.0–46.0)
Hemoglobin: 10.9 g/dL — ABNORMAL LOW (ref 12.0–15.0)
Immature Granulocytes: 1 %
Lymphocytes Relative: 13 %
Lymphs Abs: 1.6 10*3/uL (ref 0.7–4.0)
MCH: 25.5 pg — ABNORMAL LOW (ref 26.0–34.0)
MCHC: 30 g/dL (ref 30.0–36.0)
MCV: 84.8 fL (ref 80.0–100.0)
Monocytes Absolute: 1.1 10*3/uL — ABNORMAL HIGH (ref 0.1–1.0)
Monocytes Relative: 9 %
Neutro Abs: 8.9 10*3/uL — ABNORMAL HIGH (ref 1.7–7.7)
Neutrophils Relative %: 73 %
Platelet Count: 503 10*3/uL — ABNORMAL HIGH (ref 150–400)
RBC: 4.28 MIL/uL (ref 3.87–5.11)
RDW: 16.1 % — ABNORMAL HIGH (ref 11.5–15.5)
WBC Count: 12.3 10*3/uL — ABNORMAL HIGH (ref 4.0–10.5)
nRBC: 0 % (ref 0.0–0.2)

## 2023-07-16 LAB — CMP (CANCER CENTER ONLY)
ALT: 16 U/L (ref 0–44)
AST: 19 U/L (ref 15–41)
Albumin: 3.6 g/dL (ref 3.5–5.0)
Alkaline Phosphatase: 105 U/L (ref 38–126)
Anion gap: 11 (ref 5–15)
BUN: 14 mg/dL (ref 8–23)
CO2: 24 mmol/L (ref 22–32)
Calcium: 8.9 mg/dL (ref 8.9–10.3)
Chloride: 101 mmol/L (ref 98–111)
Creatinine: 0.95 mg/dL (ref 0.44–1.00)
GFR, Estimated: >60 mL/min (ref >60)
Glucose, Bld: 140 mg/dL — ABNORMAL HIGH (ref 70–99)
Potassium: 3.7 mmol/L (ref 3.5–5.1)
Sodium: 136 mmol/L (ref 135–145)
Total Bilirubin: 0.4 mg/dL (ref 0.0–1.2)
Total Protein: 7.2 g/dL (ref 6.5–8.1)

## 2023-07-16 LAB — TSH: TSH: 1.406 u[IU]/mL (ref 0.350–4.500)

## 2023-07-16 MED ORDER — SODIUM CHLORIDE 0.9 % IV SOLN
200.0000 mg | Freq: Once | INTRAVENOUS | Status: AC
  Administered 2023-07-16: 200 mg via INTRAVENOUS
  Filled 2023-07-16: qty 200

## 2023-07-16 MED ORDER — IOPAMIDOL (ISOVUE-370) INJECTION 76%
75.0000 mL | Freq: Once | INTRAVENOUS | Status: AC | PRN
  Administered 2023-07-16: 75 mL via INTRAVENOUS

## 2023-07-16 MED ORDER — HEPARIN SOD (PORK) LOCK FLUSH 100 UNIT/ML IV SOLN
500.0000 [IU] | Freq: Once | INTRAVENOUS | Status: DC | PRN
  Filled 2023-07-16: qty 5

## 2023-07-16 MED ORDER — SODIUM CHLORIDE 0.9 % IV SOLN
INTRAVENOUS | Status: DC
  Filled 2023-07-16: qty 250

## 2023-07-16 MED ORDER — SODIUM CHLORIDE 0.9% FLUSH
10.0000 mL | INTRAVENOUS | Status: DC | PRN
  Administered 2023-07-16: 10 mL via INTRAVENOUS

## 2023-07-16 MED ORDER — HEPARIN SOD (PORK) LOCK FLUSH 100 UNIT/ML IV SOLN
500.0000 [IU] | Freq: Once | INTRAVENOUS | Status: AC
  Administered 2023-07-16: 500 [IU] via INTRAVENOUS

## 2023-07-16 NOTE — Progress Notes (Signed)
 Has concerns about cough, SOB, and oxygen. Having more SOB at home trying to get around her house. She does take pulse ox at home and it ranges between 86-94.

## 2023-07-16 NOTE — Progress Notes (Signed)
 [facility]  Telephone:(336) [phone] Fax:(336) [phone]  ID: Hannah Yu OB: [birth year]  MR#: [id number]  AOZ#:[id number]  Patient Care Team: Mick Sell, MD as PCP - General (Infectious Diseases) Glory Buff, RN as Oncology Nurse Navigator Carmina Miller, MD as Consulting Physician (Radiation Oncology) Jeralyn Ruths, MD as Consulting Physician (Oncology)   CHIEF COMPLAINT: Stage IVB adenocarcinoma of the lung, PD-L1 5% and K-ras G12C mutated.  INTERVAL HISTORY: Patient returns to clinic today for further evaluation and consideration of cycle 4 of Keytruda.  She continues to have shortness of breath and cough, but otherwise feels well.  She is tolerating her treatments without significant side effects. She has no neurologic complaints.  She denies any recent fevers.  She has a good appetite and denies weight loss.  She denies any chest pain or hemoptysis.  She denies any nausea, vomiting, constipation, or diarrhea.  She has no urinary complaints.  Patient offers no further specific complaints today.  REVIEW OF SYSTEMS:   Review of Systems  Constitutional: Negative.  Negative for fever, malaise/fatigue and weight loss.  Respiratory:  Positive for cough and shortness of breath. Negative for hemoptysis.   Cardiovascular: Negative.  Negative for chest pain and leg swelling.  Gastrointestinal: Negative.  Negative for abdominal pain.  Genitourinary: Negative.  Negative for dysuria.  Musculoskeletal: Negative.  Negative for back pain and joint pain.  Skin: Negative.  Negative for rash.  Neurological: Negative.  Negative for dizziness, focal weakness, weakness and headaches.  Psychiatric/Behavioral: Negative.  The patient is not nervous/anxious.     As per HPI. Otherwise, a complete review of systems is negative.  PAST MEDICAL HISTORY: Past Medical History:  Diagnosis Date   Acid reflux    Anemia    Anesthesia complication    Tachycardia  previously, now on metoprolol   Arthritis    09/02/2019: per patient "have it real bad in both hands and back"   Difficult intubation    had to terminate intubation unable to advance tube for cholecystectomy 2017?   DVT (deep venous thrombosis) (HCC)    leg   Hypertension    Sciatica    09/02/2019: has had it for about 3 years    PAST SURGICAL HISTORY: Past Surgical History:  Procedure Laterality Date   ABDOMINAL HYSTERECTOMY     CATARACT EXTRACTION W/ INTRAOCULAR LENS IMPLANT Bilateral 2007   eyes done within 1 month of each other.   IR IMAGING GUIDED PORT INSERTION  05/19/2023   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   TOTAL HIP ARTHROPLASTY Right 09/06/2019   Procedure: RIGHT TOTAL HIP ARTHROPLASTY ANTERIOR APPROACH;  Surgeon: Kathryne Hitch, MD;  Location: [facility];  Service: Orthopedics;  Laterality: Right;   TOTAL HIP ARTHROPLASTY Left 07/31/2020   Procedure: LEFT TOTAL HIP ARTHROPLASTY ANTERIOR APPROACH;  Surgeon: Kathryne Hitch, MD;  Location: [facility];  Service: Orthopedics;  Laterality: Left;   VIDEO BRONCHOSCOPY WITH ENDOBRONCHIAL ULTRASOUND N/A 10/06/2022   Procedure: VIDEO BRONCHOSCOPY WITH ENDOBRONCHIAL ULTRASOUND;  Surgeon: Vida Rigger, MD;  Location: [facility] ORS;  Service: Thoracic;  Laterality: N/A;    FAMILY HISTORY: Family History  Problem Relation Age of Onset   Breast cancer Sister 36    ADVANCED DIRECTIVES (Y/N):  N  HEALTH MAINTENANCE: Social History   Tobacco Use   Smoking status: Former    Current packs/day: 0.00    Types: Cigarettes    Quit date: 01/02/1993  Years since quitting: 30.5   Smokeless tobacco: Never  Vaping Use   Vaping status: Never Used  Substance Use Topics   Alcohol use: Yes    Alcohol/week: 4.0 standard drinks of alcohol    Types: 4 Glasses of wine per week    Comment: 09/02/2019: per patient "ever so often"   Drug use: No      Colonoscopy:  PAP:  Bone density:  Lipid panel:  Allergies  Allergen Reactions   Ezetimibe Other (See Comments)    Body aches, and stiffness   Codeine Diarrhea   Cortisone Swelling    injections   Lovastatin Other (See Comments)    Muscle cramps    Current Outpatient Medications  Medication Sig Dispense Refill   aspirin EC 81 MG tablet Take 162 mg by mouth daily. Swallow whole.     cholecalciferol (VITAMIN D3) 25 MCG (1000 UNIT) tablet Take 1,000 Units by mouth daily.     FIBER PO Take 1 tablet by mouth daily.     furosemide (LASIX) 40 MG tablet Take 40 mg by mouth daily.     ibuprofen (ADVIL) 200 MG tablet Take 400 mg by mouth every 6 (six) hours as needed for mild pain or moderate pain.     Ibuprofen-diphenhydrAMINE HCl (IBUPROFEN PM) 200-25 MG CAPS Take 2 tablets by mouth at bedtime as needed (sleep).     lidocaine-prilocaine (EMLA) cream Apply dime sized amount over the port and apply plastic wrap over it 2 hours prior to port being accessed each time 30 g 1   losartan (COZAAR) 100 MG tablet Take 100 mg by mouth daily.     magnesium oxide (MAG-OX) 400 (240 Mg) MG tablet Take 400 mg by mouth daily.     metoprolol succinate (TOPROL-XL) 50 MG 24 hr tablet Take 50 mg by mouth daily.     omeprazole (PRILOSEC) 40 MG capsule Take 40 mg by mouth daily as needed.     ondansetron (ZOFRAN-ODT) 4 MG disintegrating tablet Take 4 mg by mouth every 8 (eight) hours as needed.     OVER THE COUNTER MEDICATION Take 1 capsule by mouth daily. CBD gummie     Tetrahydrozoline HCl (VISINE OP) Place 1 drop into both eyes daily as needed (dry eyes).     vitamin B-12 (CYANOCOBALAMIN) 1000 MCG tablet Take 1,000 mcg by mouth daily.     amoxicillin-clavulanate (AUGMENTIN) 875-125 MG tablet Take 1 tablet by mouth 2 (two) times daily. (Patient not taking: Reported on 07/16/2023) 20 tablet 0   benzonatate (TESSALON) 100 MG capsule Take 1 capsule (100 mg total) by mouth 3 (three) times daily as needed for  cough. (Patient not taking: Reported on 07/16/2023) 30 capsule 0   No current facility-administered medications for this visit.    OBJECTIVE: Vitals:   07/16/23 0947  BP: 138/69  Pulse: 72  Resp: 18  Temp: 97.7 F (36.5 C)  SpO2: 90%      Body mass index is 35.35 kg/m.    ECOG FS:1 - Symptomatic but completely ambulatory  General: Well-developed, well-nourished, no acute distress. Eyes: Pink conjunctiva, anicteric sclera. HEENT: Normocephalic, moist mucous membranes. Lungs: No audible wheezing or coughing. Heart: Regular rate and rhythm. Abdomen: Soft, nontender, no obvious distention. Musculoskeletal: No edema, cyanosis, or clubbing. Neuro: Alert, answering all questions appropriately. Cranial nerves grossly intact. Skin: No rashes or petechiae noted. Psych: Normal affect.  LAB RESULTS:  Lab Results  Component Value Date   NA 136 07/16/2023   K 3.7 07/16/2023  CL 101 07/16/2023   CO2 24 07/16/2023   GLUCOSE 140 (H) 07/16/2023   BUN 14 07/16/2023   CREATININE 0.95 07/16/2023   CALCIUM 8.9 07/16/2023   PROT 7.2 07/16/2023   ALBUMIN 3.6 07/16/2023   AST 19 07/16/2023   ALT 16 07/16/2023   ALKPHOS 105 07/16/2023   BILITOT 0.4 07/16/2023   GFRNONAA >60 07/16/2023   GFRAA >60 09/10/2019    Lab Results  Component Value Date   WBC 12.3 (H) 07/16/2023   NEUTROABS 8.9 (H) 07/16/2023   HGB 10.9 (L) 07/16/2023   HCT 36.3 07/16/2023   MCV 84.8 07/16/2023   PLT 503 (H) 07/16/2023     STUDIES: DG Chest 2 View Result Date: 06/18/2023 CLINICAL DATA:  Fever. Follow-up radiograph. History of lung cancer. EXAM: CHEST - 2 VIEW COMPARISON:  Chest radiograph dated 05/07/2023. FINDINGS: Right-sided Port-A-Cath with tip over central SVC. Bilateral mid to lower lung field interstitial and hazy airspace densities may represent atelectasis or pneumonia. A 4 cm ovoid opacity in the subpleural superior segment of the left lower lobe as well as faint nodular density in the left  upper lobe. A small left pleural effusion suspected. No pneumothorax. Stable cardiac silhouette. Atherosclerotic calcification of the aorta. Degenerative changes spine. No acute osseous pathology. IMPRESSION: 1. Bilateral mid to lower lung field atelectasis or pneumonia. 2. Left lower lobe mass and left upper lobe nodular density. CT is recommended for better evaluation. Electronically Signed   By: Elgie Collard M.D.   On: 06/18/2023 17:02    ASSESSMENT: Stage IVB adenocarcinoma of the lung, PD-L1 5% and K-ras G12C mutated.  PLAN:    Stage IVB adenocarcinoma of the lung, PD-L1 5% and K-ras G12C mutated: Patient completed SBRT to the 2.7 cm irregular left lower lobe nodule in July 2024.  PET scan results from February 13, 2023 reviewed independently with residual low CT level of activity suggesting treatment response in the left lower lobe.  The AP window lymph node seen on recent CT scan reveals progressive enlargement now measuring 1.7 x 1.3 cm as well as increased hypermetabolic activity.  Patient completed XRT to this lesion.  Patient has also completed XRT to her left pelvis.  Current plan is to proceed with 200 mg Keytruda every 3 weeks for 1-2 years.  Patient has had port placement.  If she is noted to have progressive disease can use at Adagrasib 600 mg twice daily given her K-ras mutation.  Proceed with cycle 4 of Keytruda today.  Return to clinic in 3 weeks for further evaluation and consideration of cycle 5.  Will reimage with chest CT prior to next treatment.  Left greater sciatic notch lesion: This is a new hypermetabolic mass measuring 2.0 x 1.3 cm.  Biopsy confirmed adenocarcinoma.  XRT and Keytruda as above.   Left acromial lesion: Possibly metastasis. Keytruda as above. Shortness of breath/cough: Chronic and unchanged.  CT scan as above.  Continue symptomatic treatment.   Left shoulder pain: Appears to be musculoskeletal.  Patient was previously given a referral to occupational  therapy.  I spent a total of 30 minutes reviewing chart data, face-to-face evaluation with the patient, counseling and coordination of care as detailed above.    Patient expressed understanding and was in agreement with this plan. She also understands that She can call clinic at any time with any questions, concerns, or complaints.    Cancer Staging  Adenocarcinoma of left lung [facility]) Staging form: Lung, AJCC 8th Edition - Clinical stage from  03/19/2023: Stage IVB (cTX, cN2, cM1c) - Signed by Jeralyn Ruths, MD on 03/19/2023 Stage prefix: Initial diagnosis   Jeralyn Ruths, MD   07/16/2023 10:28 AM

## 2023-07-17 ENCOUNTER — Other Ambulatory Visit: Payer: Self-pay | Admitting: *Deleted

## 2023-07-17 DIAGNOSIS — R0602 Shortness of breath: Secondary | ICD-10-CM

## 2023-07-17 DIAGNOSIS — C3492 Malignant neoplasm of unspecified part of left bronchus or lung: Secondary | ICD-10-CM

## 2023-07-20 ENCOUNTER — Ambulatory Visit
Admission: RE | Admit: 2023-07-20 | Discharge: 2023-07-20 | Disposition: A | Discharge status: Home / Self Care | Source: Ambulatory Visit | Attending: Oncology | Admitting: Oncology

## 2023-07-20 DIAGNOSIS — R0602 Shortness of breath: Secondary | ICD-10-CM | POA: Diagnosis not present

## 2023-07-20 DIAGNOSIS — D735 Infarction of spleen: Secondary | ICD-10-CM | POA: Insufficient documentation

## 2023-07-20 DIAGNOSIS — I700 Atherosclerosis of aorta: Secondary | ICD-10-CM | POA: Diagnosis not present

## 2023-07-20 DIAGNOSIS — K8020 Calculus of gallbladder without cholecystitis without obstruction: Secondary | ICD-10-CM | POA: Diagnosis not present

## 2023-07-20 DIAGNOSIS — M899 Disorder of bone, unspecified: Secondary | ICD-10-CM | POA: Insufficient documentation

## 2023-07-20 DIAGNOSIS — C782 Secondary malignant neoplasm of pleura: Secondary | ICD-10-CM | POA: Diagnosis not present

## 2023-07-20 DIAGNOSIS — Z96642 Presence of left artificial hip joint: Secondary | ICD-10-CM | POA: Diagnosis not present

## 2023-07-20 DIAGNOSIS — C3492 Malignant neoplasm of unspecified part of left bronchus or lung: Secondary | ICD-10-CM | POA: Insufficient documentation

## 2023-07-20 DIAGNOSIS — J90 Pleural effusion, not elsewhere classified: Secondary | ICD-10-CM | POA: Diagnosis present

## 2023-07-20 LAB — GLUCOSE, CAPILLARY: Glucose-Capillary: 85 mg/dL (ref 70–99)

## 2023-07-20 MED ORDER — FLUDEOXYGLUCOSE F - 18 (FDG) INJECTION
9.4000 | Freq: Once | INTRAVENOUS | Status: AC | PRN
  Administered 2023-07-20: 9.9 via INTRAVENOUS

## 2023-07-21 ENCOUNTER — Telehealth: Payer: Self-pay | Admitting: Pharmacist

## 2023-07-21 ENCOUNTER — Inpatient Hospital Stay (HOSPITAL_BASED_OUTPATIENT_CLINIC_OR_DEPARTMENT_OTHER): Admitting: Oncology

## 2023-07-21 ENCOUNTER — Telehealth: Payer: Self-pay | Admitting: Pharmacy Technician

## 2023-07-21 ENCOUNTER — Other Ambulatory Visit (HOSPITAL_COMMUNITY): Payer: Self-pay

## 2023-07-21 ENCOUNTER — Other Ambulatory Visit: Payer: Self-pay | Admitting: *Deleted

## 2023-07-21 ENCOUNTER — Ambulatory Visit: Admitting: Pharmacist

## 2023-07-21 ENCOUNTER — Encounter: Payer: Self-pay | Admitting: Oncology

## 2023-07-21 VITALS — BP 182/59 | HR 94 | Temp 99.3°F | Resp 20 | Ht 60.0 in | Wt 180.0 lb

## 2023-07-21 DIAGNOSIS — C3492 Malignant neoplasm of unspecified part of left bronchus or lung: Secondary | ICD-10-CM

## 2023-07-21 DIAGNOSIS — R9389 Abnormal findings on diagnostic imaging of other specified body structures: Secondary | ICD-10-CM

## 2023-07-21 DIAGNOSIS — J90 Pleural effusion, not elsewhere classified: Secondary | ICD-10-CM | POA: Diagnosis not present

## 2023-07-21 DIAGNOSIS — Z5112 Encounter for antineoplastic immunotherapy: Secondary | ICD-10-CM | POA: Diagnosis not present

## 2023-07-21 MED ORDER — ADAGRASIB 200 MG PO TABS
600.0000 mg | ORAL_TABLET | Freq: Two times a day (BID) | ORAL | 0 refills | Status: DC
  Filled 2023-07-22: qty 180, 30d supply, fill #0

## 2023-07-21 MED ORDER — PROCHLORPERAZINE MALEATE 10 MG PO TABS: 10.0000 mg | ORAL_TABLET | Freq: Four times a day (QID) | ORAL | 2 refills | Status: DC | PRN

## 2023-07-21 MED ORDER — ADAGRASIB 200 MG PO TABS: 600.0000 mg | ORAL_TABLET | Freq: Two times a day (BID) | ORAL | Status: DC

## 2023-07-21 NOTE — Telephone Encounter (Signed)
 Oral Oncology Patient Advocate Encounter  Prior Authorization for [facility] has been approved.    PA# [id number] Effective dates: 07/21/2023 through 01/17/2024  Patients co-pay is $1,928.58.    Patty Almedia Balls, CPhT Oncology Pharmacy Patient Advocate [facility] Direct Number: [phone] Fax: [phone]

## 2023-07-21 NOTE — Telephone Encounter (Signed)
 I called and spoke with patient, she has been scheduled for MD follow up visit today at 2:45 pm.

## 2023-07-21 NOTE — Progress Notes (Signed)
 Clinical Pharmacist Practitioner Clinic [facility]  [phone] Fax:([phone]  Patient Care Team: Mick Sell, MD as PCP - General (Infectious Diseases) Glory Buff, RN as Oncology Nurse Navigator Carmina Miller, MD as Consulting Physician (Radiation Oncology) Jeralyn Ruths, MD as Consulting Physician (Oncology)   Name of the patient: Hannah Yu  [id number]  1945-06-22   Date of visit: 07/21/23  HPI: Patient is a 78 y.o. female with progressive NSCLC, KRAS G12C mutation positive ([place] 03/3023). Planned treatment with Caryn Section (adagrasib).   Reason for Consult: Adagrasib oral chemotherapy education.   PAST MEDICAL HISTORY: Past Medical History:  Diagnosis Date   Acid reflux    Anemia    Anesthesia complication    Tachycardia previously, now on metoprolol   Arthritis    09/02/2019: per patient "have it real bad in both hands and back"   Difficult intubation    had to terminate intubation unable to advance tube for cholecystectomy 2017?   DVT (deep venous thrombosis) (HCC)    leg   Hypertension    Sciatica    09/02/2019: has had it for about 3 years    HEMATOLOGY/ONCOLOGY HISTORY:  Oncology History  Adenocarcinoma of left lung (HCC)  03/19/2023 Initial Diagnosis   Adenocarcinoma of left lung (HCC)   03/19/2023 Cancer Staging   Staging form: Lung, AJCC 8th Edition - Clinical stage from 03/19/2023: Stage IVB (cTX, cN2, cM1c) - Signed by Jeralyn Ruths, MD on 03/19/2023 Stage prefix: Initial diagnosis   05/07/2023 -  Chemotherapy   Patient is on Treatment Plan : LUNG NSCLC Pembrolizumab (200) q21d       ALLERGIES:  is allergic to ezetimibe, codeine, cortisone, and lovastatin.  MEDICATIONS:  Current Outpatient Medications  Medication Sig Dispense Refill   adagrasib (KRAZATI) 200 MG tablet Take 3 tablets (600 mg total) by mouth 2 (two) times daily.     aspirin EC 81 MG tablet Take 162 mg by mouth daily.  Swallow whole.     cholecalciferol (VITAMIN D3) 25 MCG (1000 UNIT) tablet Take 1,000 Units by mouth daily.     FIBER PO Take 1 tablet by mouth daily.     furosemide (LASIX) 40 MG tablet Take 40 mg by mouth daily.     ibuprofen (ADVIL) 200 MG tablet Take 400 mg by mouth every 6 (six) hours as needed for mild pain or moderate pain.     Ibuprofen-diphenhydrAMINE HCl (IBUPROFEN PM) 200-25 MG CAPS Take 2 tablets by mouth at bedtime as needed (sleep).     lidocaine-prilocaine (EMLA) cream Apply dime sized amount over the port and apply plastic wrap over it 2 hours prior to port being accessed each time 30 g 1   losartan (COZAAR) 100 MG tablet Take 100 mg by mouth daily.     magnesium oxide (MAG-OX) 400 (240 Mg) MG tablet Take 400 mg by mouth daily.     metoprolol succinate (TOPROL-XL) 50 MG 24 hr tablet Take 50 mg by mouth daily.     omeprazole (PRILOSEC) 40 MG capsule Take 40 mg by mouth daily as needed.     ondansetron (ZOFRAN-ODT) 4 MG disintegrating tablet Take 4 mg by mouth every 8 (eight) hours as needed.     OVER THE COUNTER MEDICATION Take 1 capsule by mouth daily. CBD gummie     Tetrahydrozoline HCl (VISINE OP) Place 1 drop into both eyes daily as needed (dry eyes).     vitamin B-12 (CYANOCOBALAMIN) 1000 MCG tablet Take 1,000 mcg by  mouth daily.     No current facility-administered medications for this visit.    VITAL SIGNS: There were no vitals taken for this visit. There were no vitals filed for this visit.  Estimated body mass index is 35.15 kg/m as calculated from the following:   Height as of an earlier encounter on 07/21/23: 5' (1.524 m).   Weight as of an earlier encounter on 07/21/23: 81.6 kg (180 lb).  LABS: CBC:    Component Value Date/Time   WBC 12.3 (H) 07/16/2023 0936   WBC 8.4 03/11/2023 0753   HGB 10.9 (L) 07/16/2023 0936   HCT 36.3 07/16/2023 0936   PLT 503 (H) 07/16/2023 0936   MCV 84.8 07/16/2023 0936   NEUTROABS 8.9 (H) 07/16/2023 0936   LYMPHSABS 1.6  07/16/2023 0936   MONOABS 1.1 (H) 07/16/2023 0936   EOSABS 0.4 07/16/2023 0936   BASOSABS 0.1 07/16/2023 0936   Comprehensive Metabolic Panel:    Component Value Date/Time   NA 136 07/16/2023 0936   K 3.7 07/16/2023 0936   CL 101 07/16/2023 0936   CO2 24 07/16/2023 0936   BUN 14 07/16/2023 0936   CREATININE 0.95 07/16/2023 0936   GLUCOSE 140 (H) 07/16/2023 0936   CALCIUM 8.9 07/16/2023 0936   AST 19 07/16/2023 0936   ALT 16 07/16/2023 0936   ALKPHOS 105 07/16/2023 0936   BILITOT 0.4 07/16/2023 0936   PROT 7.2 07/16/2023 0936   ALBUMIN 3.6 07/16/2023 0936     Present during today's visit: patient and her friend  Start plan: patient will start when she has medication in hand, likely early next week   Patient Education I spoke with patient for overview of new oral chemotherapy medication: adagrasib   Administration: Counseled patient on administration, dosing, side effects, monitoring, drug-food interactions, safe handling, storage, and disposal. Patient will take 3 tablets (600 mg total) by mouth 2 (two) times daily .  Side Effects: Side effects include but not limited to: diarrhea, nausea, fatigue, decreased wbc/hgb, edema, changes in liver function.   Diarrhea: patient knows to use loperamide as needed and call the office if they are having four or more loose stool per day Nausea: Asked the patient to stop taking her ondansetron due to QTc prolongation concerns with adagrasib, she will use prochlorperazine as needed as an alternative  Drug-drug Interactions (DDI): Metoprolol and losartan DDIs reviewed with patient. (See assessment note for DDI details)   Adherence: After discussion with patient no patient barriers to medication adherence identified.  Reviewed with patient importance of keeping a medication schedule and plan for any missed doses.  Ms. Dunsworth voiced understanding and appreciation. All questions answered. Medication handout provided.  Provided patient  with Oral Chemotherapy Navigation Clinic phone number. Patient knows to call the office with questions or concerns. Oral Chemotherapy Navigation Clinic will continue to follow.  Patient expressed understanding and was in agreement with this plan. She also understands that She can call clinic at any time with any questions, concerns, or complaints.   Medication Access Issues: No issue, patient plans on paying the out of pocket cost at the [facility])  Follow-up plan: RTC as scheduled  Thank you for allowing me to participate in the care of this patient.   Time Total: 30 mins  Visit consisted of counseling and education on dealing with issues of symptom management in the setting of serious and potentially life-threatening illness.Greater than 50%  of this time was spent counseling and coordinating care related to  the above assessment and plan.  Signed by: Remi Haggard, PharmD, Nolon Bussing, CPP Hematology/Oncology Clinical Pharmacist Practitioner [facility] [phone]  07/21/2023 3:53 PM

## 2023-07-21 NOTE — Progress Notes (Signed)
 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  INTERVAL HISTORY: Patient returns to clinic today for further evaluation and discussion of her PET scan results.  She has increased shortness of breath and cough, but otherwise feels well.  She has also anxious.  She does not complain of pain today.  She has no neurologic complaints.  She denies any recent fevers.  She has a good appetite and denies weight loss.  She denies any chest pain or hemoptysis.  She denies any nausea, vomiting, constipation, or diarrhea.  She has no urinary complaints.  Patient offers no further specific complaints today.  REVIEW OF SYSTEMS:   Review of Systems  Constitutional: Negative.  Negative for fever, malaise/fatigue and weight loss.  Respiratory:  Positive for cough and shortness of breath. Negative for hemoptysis.   Cardiovascular: Negative.  Negative for chest pain and leg swelling.  Gastrointestinal: Negative.  Negative for abdominal pain.  Genitourinary: Negative.  Negative for dysuria.  Musculoskeletal: Negative.  Negative for back pain and joint pain.  Skin: Negative.  Negative for rash.  Neurological: Negative.  Negative for dizziness, focal weakness, weakness and headaches.  Psychiatric/Behavioral:  The patient is nervous/anxious.     As per HPI. Otherwise, a complete review of systems is negative.  PAST MEDICAL HISTORY: Past Medical History:  Diagnosis Date   Acid reflux    Anemia    Anesthesia complication    Tachycardia previously, now on  metoprolol   Arthritis    09/02/2019: per patient "have it real bad in both hands and back"   Difficult intubation    had to terminate intubation unable to advance tube for cholecystectomy 2017?   DVT (deep venous thrombosis) (HCC)    leg   Hypertension    Sciatica    09/02/2019: has had it for about 3 years    PAST SURGICAL HISTORY: Past Surgical History:  Procedure Laterality Date   ABDOMINAL HYSTERECTOMY     CATARACT EXTRACTION W/ INTRAOCULAR LENS IMPLANT Bilateral 2007   eyes done within 1 month of each other.   IR IMAGING GUIDED PORT INSERTION  05/19/2023   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   TOTAL HIP ARTHROPLASTY Right 09/06/2019   Procedure: RIGHT TOTAL HIP ARTHROPLASTY ANTERIOR APPROACH;  Surgeon: Kathryne Hitch, MD;  Location: [facility] OR;  Service: Orthopedics;  Laterality: Right;   TOTAL HIP ARTHROPLASTY Left 07/31/2020   Procedure: LEFT TOTAL HIP ARTHROPLASTY ANTERIOR APPROACH;  Surgeon: Kathryne Hitch, MD;  Location: [facility] OR;  Service: Orthopedics;  Laterality: Left;   VIDEO BRONCHOSCOPY WITH ENDOBRONCHIAL ULTRASOUND N/A 10/06/2022   Procedure: VIDEO BRONCHOSCOPY WITH ENDOBRONCHIAL ULTRASOUND;  Surgeon: Vida Rigger, MD;  Location: [facility] ORS;  Service: Thoracic;  Laterality: N/A;    FAMILY HISTORY: Family History  Problem Relation Age of Onset   Breast cancer Sister 11    ADVANCED DIRECTIVES (Y/N):  N  HEALTH MAINTENANCE: Social History   Tobacco Use   Smoking status: Former    Current packs/day: 0.00    Types: Cigarettes    Quit date:  01/02/1993    Years since quitting: 30.5   Smokeless tobacco: Never  Vaping Use   Vaping status: Never Used  Substance Use Topics   Alcohol use: Yes    Alcohol/week: 4.0 standard drinks of alcohol    Types: 4 Glasses of wine per week    Comment: 09/02/2019: per patient "ever so often"   Drug use: No     Colonoscopy:  PAP:  Bone  density:  Lipid panel:  Allergies  Allergen Reactions   Ezetimibe Other (See Comments)    Body aches, and stiffness   Codeine Diarrhea   Cortisone Swelling    injections   Lovastatin Other (See Comments)    Muscle cramps    Current Outpatient Medications  Medication Sig Dispense Refill   aspirin EC 81 MG tablet Take 162 mg by mouth daily. Swallow whole.     cholecalciferol (VITAMIN D3) 25 MCG (1000 UNIT) tablet Take 1,000 Units by mouth daily.     FIBER PO Take 1 tablet by mouth daily.     furosemide (LASIX) 40 MG tablet Take 40 mg by mouth daily.     ibuprofen (ADVIL) 200 MG tablet Take 400 mg by mouth every 6 (six) hours as needed for mild pain or moderate pain.     Ibuprofen-diphenhydrAMINE HCl (IBUPROFEN PM) 200-25 MG CAPS Take 2 tablets by mouth at bedtime as needed (sleep).     lidocaine-prilocaine (EMLA) cream Apply dime sized amount over the port and apply plastic wrap over it 2 hours prior to port being accessed each time 30 g 1   losartan (COZAAR) 100 MG tablet Take 100 mg by mouth daily.     magnesium oxide (MAG-OX) 400 (240 Mg) MG tablet Take 400 mg by mouth daily.     metoprolol succinate (TOPROL-XL) 50 MG 24 hr tablet Take 50 mg by mouth daily.     omeprazole (PRILOSEC) 40 MG capsule Take 40 mg by mouth daily as needed.     ondansetron (ZOFRAN-ODT) 4 MG disintegrating tablet Take 4 mg by mouth every 8 (eight) hours as needed.     OVER THE COUNTER MEDICATION Take 1 capsule by mouth daily. CBD gummie     Tetrahydrozoline HCl (VISINE OP) Place 1 drop into both eyes daily as needed (dry eyes).     vitamin B-12 (CYANOCOBALAMIN) 1000 MCG tablet Take 1,000 mcg by mouth daily.     adagrasib (KRAZATI) 200 MG tablet Take 3 tablets (600 mg total) by mouth 2 (two) times daily.     No current facility-administered medications for this visit.    OBJECTIVE: Vitals:   07/21/23 1435  BP: (!) 182/59  Pulse: 94  Resp: 20  Temp: 99.3 F (37.4 C)  SpO2: (!) 88%      Body mass  index is 35.15 kg/m.    ECOG FS:1 - Symptomatic but completely ambulatory  General: Well-developed, well-nourished, no acute distress. Eyes: Pink conjunctiva, anicteric sclera. HEENT: Normocephalic, moist mucous membranes. Lungs: No audible wheezing or coughing. Heart: Regular rate and rhythm. Abdomen: Soft, nontender, no obvious distention. Musculoskeletal: No edema, cyanosis, or clubbing. Neuro: Alert, answering all questions appropriately. Cranial nerves grossly intact. Skin: No rashes or petechiae noted. Psych: Normal affect.  LAB RESULTS:  Lab Results  Component Value Date   NA 136 07/16/2023   K 3.7 07/16/2023   CL 101 07/16/2023   CO2 24 07/16/2023   GLUCOSE 140 (H) 07/16/2023   BUN 14 07/16/2023   CREATININE 0.95 07/16/2023   CALCIUM 8.9  07/16/2023   PROT 7.2 07/16/2023   ALBUMIN 3.6 07/16/2023   AST 19 07/16/2023   ALT 16 07/16/2023   ALKPHOS 105 07/16/2023   BILITOT 0.4 07/16/2023   GFRNONAA >60 07/16/2023   GFRAA >60 09/10/2019    Lab Results  Component Value Date   WBC 12.3 (H) 07/16/2023   NEUTROABS 8.9 (H) 07/16/2023   HGB 10.9 (L) 07/16/2023   HCT 36.3 07/16/2023   MCV 84.8 07/16/2023   PLT 503 (H) 07/16/2023     STUDIES: NM PET Image Restag (PS) Skull Base To Thigh Result Date: 07/20/2023 CLINICAL DATA:  Subsequent treatment strategy for lung cancer. EXAM: NUCLEAR MEDICINE PET SKULL BASE TO THIGH TECHNIQUE: 9.9 mCi F-18 FDG was injected intravenously. Full-ring PET imaging was performed from the skull base to thigh after the radiotracer. CT data was obtained and used for attenuation correction and anatomic localization. Fasting blood glucose: 85 mg/dl COMPARISON:  11/91/4782 and chest CT from 07/16/2023 FINDINGS: Mediastinal blood pool activity: SUV max 3.0 Liver activity: SUV max NA NECK: New hypermetabolic focus in the left inferior cerebellum, maximum SUV 42.4 compared to the contralateral normal appearing side that has a maximum SUV of 10.2.  Concerning for potential intracranial metastatic disease, dedicated MRI brain with and without contrast recommended. Incidental CT findings: None. CHEST: Moderate to large partially loculated left pleural effusion with numerous metastatic foci along the pleural margins. Tumor posteriorly in the left hemithorax measuring 2.7 cm in short axis on image 53 series 6 has a maximum SUV of 14.7. Multiple other pleural metastatic foci are present on the left compatible with malignant pleural effusion. Hypermetabolic activity anteriorly along the atelectatic left lower lobe may be pleural or within the collapsed lower lobe, maximum SUV 12.3, as shown on image 69 series 607. Other foci of hypermetabolic activity may be in the lower lobe or more likely pleural based. A small left hilar lymph node has a maximum SUV of 7.9. Anterior nodule potentially along the pleural or adipose tissues outside of the pericardium measures 1.6 by 0.8 cm on image 64 series 6 with maximum SUV 7.7. Incidental CT findings: Coronary, aortic arch, and branch vessel atherosclerotic vascular disease. Right Port-A-Cath tip: Cavoatrial junction. Mild scattered atelectasis or scarring in the right lung. ABDOMEN/PELVIS: No well-defined adrenal mass,, right adrenal activity maximum SUV 4.5 (previously 4.2) and left adrenal activity maximum SUV 4.9 (previously 3.7). The left sciatic notch mass measures 0.9 cm in short axis (previously 1.3 cm) with maximum SUV 3.5 (formerly 10.0). Left hip prosthesis with accentuated metabolic activity along what appears to be an iliopsoas bursa, likely from synovitis and bursitis. Left external iliac node 1.2 cm in short axis on image 29 series 2 with maximum SUV 7.0, previously 1.1 cm with maximum SUV 4.9. Incidental CT findings: Cholelithiasis. Atherosclerosis is present, including aortoiliac atherosclerotic disease. Sigmoid colon diverticulosis. Wedge-shaped hypo activity in the upper spleen correlating with recent  hypoenhancement compatible with splenic infarcts. SKELETON: Enlarging lytic lesion of the left acromion currently 3.5 cm in long axis on image 30 series 6, maximum SUV 14.8 and previously 7.9. New small focal hypodense lesion of the left posterior acetabular cortex maximum SUV 4.6, suspicious for early metastatic lesion. Incidental CT findings: Bilateral hip prostheses. IMPRESSION: 1. New small hypermetabolic focus in the left inferior cerebellum, concerning for potential intracranial metastatic disease, dedicated MRI brain with and without contrast recommended. 2. Moderate to large partially loculated left pleural effusion with numerous metastatic foci along the pleural margins. 3. Enlarging lytic lesion  of the left acromion with increasing metabolic activity. 4. New small focal hypodense lesion of the left posterior acetabular cortex with maximum SUV 4.6, suspicious for early metastatic lesion. 5. Left external iliac node has increased in size and metabolic activity. 6. Left sciatic notch mass has decreased in size and metabolic activity. 7. Bilateral adrenal activity without well-defined adrenal mass, maximum SUV 4.5 (previously 4.2) and left adrenal activity maximum SUV 4.9 (previously 3.7). 8. Left hip prosthesis with accentuated metabolic activity along what appears to be an iliopsoas bursa, likely from synovitis and bursitis. 9. Splenic infarcts again noted. 10. Cholelithiasis. 11. Aortic atherosclerosis. Aortic Atherosclerosis (ICD10-I70.0). Electronically Signed   By: Gaylyn Rong M.D.   On: 07/20/2023 15:26   CT CHEST W CONTRAST Result Date: 07/16/2023 CLINICAL DATA:  Restaging lung cancer.  * Tracking Code: BO * EXAM: CT CHEST WITH CONTRAST TECHNIQUE: Multidetector CT imaging of the chest was performed during intravenous contrast administration. RADIATION DOSE REDUCTION: This exam was performed according to the departmental dose-optimization program which includes automated exposure control,  adjustment of the mA and/or kV according to patient size and/or use of iterative reconstruction technique. CONTRAST:  75mL ISOVUE-370 IOPAMIDOL (ISOVUE-370) INJECTION 76% COMPARISON:  X-ray 06/18/2023. PET-CT scan 02/06/2023. CT chest abdomen pelvis 01/15/2023 FINDINGS: Cardiovascular: Right IJ chest port is accessed. Tip of the catheter extends into the SVC right atrial junction region. Small caliber right internal jugular vein. Heart is nonenlarged. Prominent epicardial fat. Small pericardial effusion. Thoracic aorta is normal course and caliber with scattered partially calcified plaque. Mediastinum/Nodes: Normal caliber thoracic esophagus. Preserved thyroid gland. No specific abnormal lymph node enlargement identified in the axillary regions. On the prior examination there was a hypermetabolic lymph node along the left side of the upper mediastinum measuring 13 x 8 mm. This is not seen on the current examination. There are some small nodes elsewhere the mediastinum which are less than a cm short axis and not pathologic by size criteria. Some of these were seen on the prior examination as well. There is some fullness left hilum but again these are small nodes. Lungs/Pleura: Breathing motion identified diffusely. Emphysematous changes as well. Right lung has some bandlike opacity along the medial right lower lobe, likely scar or atelectasis. Similar changes in the middle lobe are stable from previous examination. There is some nonspecific ground-glass in the right lung. The areas of somewhat scattered. On left side there is moderate pleural effusion with some loculated areas. Focal soft tissue mass along the lower left hemithorax along the pleura measuring 6.7 by 2.5 cm. There are additional nodules along the course of the interlobar fissure of the left lung such as series 2, image 74 measuring 17 by 12 mm. Several other foci identified as well such as image 62. Other foci identified as well such as image 84.  Medially along the left upper pole hemothorax measures 12 mm x 13 mm on image 60. It is uncertain this is juxtapleural and parenchymal. Associated areas of consolidation left lower lobe with some occlusion of the bronchi, presumed mucous plugging in the left lower lobe. Upper Abdomen: Heterogeneous enhancement of the spleen. Based on appearance this could be areas of developing infarction. The adrenal glands are grossly preserved. Musculoskeletal: Scattered degenerative changes along the spine. IMPRESSION: Development of moderate left pleural effusion with loculated components and nodular soft tissue along the pleura in multiple locations worrisome for pleural metastases. Developing parenchymal opacity in the left lower lobe with some mucous plugging. Previous left-sided upper mediastinal lymph  node is no longer identified. Other areas of nodal changes in the mediastinum and hilum are small. Heterogeneous enhancement of the spleen with wedge-shaped low-density areas worrisome for developing splenic infarcts. Aortic Atherosclerosis (ICD10-I70.0) and Emphysema (ICD10-J43.9). Electronically Signed   By: Karen Kays M.D.   On: 07/16/2023 17:08   ONCOLOGY HISTORY: Patient completed SBRT to the 2.7 cm irregular left lower lobe nodule in July 2024.  PET scan results from February 13, 2023 reviewed independently with residual low level of activity suggesting treatment response in the left lower lobe.  The AP window lymph node seen on subsequent CT scan revealed progressive enlargement now measuring 1.7 x 1.3 cm as well as increased hypermetabolic activity.  Patient completed XRT to this lesion.  Patient has also completed XRT to her left pelvis.    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  Patient initiated Rande Lawman on May 07, 2023.  But  given her progressive disease, will switch treatment to Adagrasib Caryn Section) 600 mg twice daily given her K-ras mutation.  Will repeat brain MRI to complete the restaging workup.  She has been instructed to keep her previously scheduled follow-up appointment on August 06, 2023.  If Adagrasib is not available, will give the patient 1 last infusion of Keytruda at that visit.   Left greater sciatic notch lesion: This is a new hypermetabolic mass measuring 2.0 x 1.3 cm.  Biopsy confirmed adenocarcinoma.  XRT and Keytruda as above.   Shortness of breath/cough: Chronic and unchanged.  Will get ultrasound-guided thoracentesis to assess if any fluid is able to be aspirated.   Left shoulder pain: Appears to be musculoskeletal.  Patient was previously given a referral to occupational therapy.  I spent a total of 30 minutes reviewing chart data, face-to-face evaluation with the patient, counseling and coordination of care as detailed above.   Patient expressed understanding and was in agreement with this plan. She also understands that She can call clinic at any time with any questions, concerns, or complaints.    Cancer Staging  Adenocarcinoma of left lung [facility]) Staging form: Lung, AJCC 8th Edition - Clinical stage from 03/19/2023: Stage IVB (cTX, cN2, cM1c) - Signed by Jeralyn Ruths, MD on 03/19/2023 Stage prefix: Initial diagnosis   Jeralyn Ruths, MD   07/21/2023 3:51 PM

## 2023-07-21 NOTE — Telephone Encounter (Signed)
 Clinical Pharmacist Practitioner Encounter   Received new prescription for Hannah Yu [facility]) for the treatment of progressive NSCLC, KRAS G12C mutation positive ([place] 03/3023), planned duration until disease progression or unacceptable drug toxicity.  Prescription dose and frequency assessed. Hepatic labs will be monitoring on therapy  Current medication list in Epic reviewed, a few DDIs with adagrasib identified: Metoprolol: adagrasib may increase the serum concentration of Metoprolol. Monitor closely for evidence of excessive response to metoprolol.  Losartan: adagrasib may decrease serum concentrations of the active metabolite(s) of Losartan. Monitor for decreased therapeutic effects of losartan.   Evaluated chart and no patient barriers to medication adherence identified.   Prescription has been e-scribed to the [facility] for benefits analysis and approval.  Oral Oncology Clinic will continue to follow for insurance authorization, copayment issues, initial counseling and start date.   Remi Haggard, PharmD, BCOP, CPP Hematology/Oncology Clinical Pharmacist [facility] Oral Chemotherapy Navigation Clinic [phone]  07/21/2023 1:58 PM

## 2023-07-21 NOTE — Telephone Encounter (Signed)
 Oral Oncology Patient Advocate Encounter   Began application for assistance for [place] through [employer].   Application will be submitted upon completion of necessary supporting documentation.   BMS phone number [phone].   I will continue to check the status until final determination.   Patty Almedia Balls, CPhT Oncology Pharmacy Patient Advocate [facility] Direct Number: [phone] Fax: [phone]

## 2023-07-21 NOTE — Telephone Encounter (Signed)
 Oral Oncology Patient Advocate Encounter  Patient has decided to pay out-of-pocket.  Patty Almedia Balls, CPhT Oncology Pharmacy Patient Advocate [facility] Direct Number: [phone] Fax: [phone]

## 2023-07-21 NOTE — Telephone Encounter (Signed)
 Oral Oncology Patient Advocate Encounter   Received notification that prior authorization for Caryn Section is required.   PA submitted on 07/21/2023 Key  [id number] Status is pending     Patty Almedia Balls, CPhT Oncology Pharmacy Patient Advocate [facility] Direct Number: [phone] Fax: [phone]

## 2023-07-22 ENCOUNTER — Other Ambulatory Visit: Payer: Self-pay

## 2023-07-22 ENCOUNTER — Other Ambulatory Visit: Payer: Self-pay | Admitting: Pharmacy Technician

## 2023-07-22 ENCOUNTER — Ambulatory Visit
Admission: RE | Admit: 2023-07-22 | Discharge: 2023-07-22 | Disposition: A | Discharge status: Home / Self Care | Source: Ambulatory Visit | Attending: Oncology | Admitting: Oncology

## 2023-07-22 ENCOUNTER — Ambulatory Visit
Admission: RE | Admit: 2023-07-22 | Discharge: 2023-07-22 | Disposition: A | Discharge status: Home / Self Care | Source: Ambulatory Visit | Attending: Student | Admitting: Student

## 2023-07-22 ENCOUNTER — Other Ambulatory Visit: Payer: Self-pay | Admitting: Student

## 2023-07-22 ENCOUNTER — Other Ambulatory Visit (HOSPITAL_COMMUNITY): Payer: Self-pay

## 2023-07-22 DIAGNOSIS — C3490 Malignant neoplasm of unspecified part of unspecified bronchus or lung: Secondary | ICD-10-CM | POA: Diagnosis not present

## 2023-07-22 DIAGNOSIS — J90 Pleural effusion, not elsewhere classified: Secondary | ICD-10-CM | POA: Insufficient documentation

## 2023-07-22 MED ORDER — LIDOCAINE HCL (PF) 1 % IJ SOLN
10.0000 mL | Freq: Once | INTRAMUSCULAR | Status: AC
Start: 2023-07-22 — End: 2023-07-22
  Administered 2023-07-22: 10 mL via INTRADERMAL
  Filled 2023-07-22: qty 10

## 2023-07-22 NOTE — Procedures (Signed)
 PROCEDURE SUMMARY:  Successful US guided left thoracentesis. Yielded 700 ml of amber-colored fluid. Pt tolerated procedure well. No immediate complications.  Specimen not sent for labs. CXR ordered; no post-procedure pneumothorax identified.   EBL < 2 mL  Mickie Kay,  NP 07/22/2023 3:46 PM

## 2023-07-22 NOTE — Progress Notes (Signed)
 Patient education documented in EPIC note on 07/21/23.

## 2023-07-22 NOTE — Progress Notes (Signed)
 Specialty Pharmacy Initial Fill Coordination Note  Hannah Yu is a 78 y.o. female contacted today regarding refills of specialty medication(s) Adagrasib (KRAZATI) .  Patient requested Daryll Drown at [facility] at [place]  on 07/24/23   Medication will be filled on 07/23/2023.   Patient is aware of $1,928.58 copayment. Patient will be paying with a check.  Patty Almedia Balls, CPhT Oncology Pharmacy Patient Advocate [facility] Direct Number: [phone] Fax: [phone]

## 2023-07-23 ENCOUNTER — Other Ambulatory Visit: Payer: Self-pay | Admitting: Oncology

## 2023-07-23 ENCOUNTER — Other Ambulatory Visit: Payer: Self-pay

## 2023-07-23 ENCOUNTER — Other Ambulatory Visit (HOSPITAL_COMMUNITY): Payer: Self-pay

## 2023-08-06 ENCOUNTER — Ambulatory Visit: Admitting: Oncology

## 2023-08-06 ENCOUNTER — Ambulatory Visit

## 2023-08-07 ENCOUNTER — Encounter: Payer: Self-pay | Admitting: Oncology

## 2023-08-10 ENCOUNTER — Other Ambulatory Visit: Payer: Self-pay | Admitting: *Deleted

## 2023-08-10 DIAGNOSIS — M7989 Other specified soft tissue disorders: Secondary | ICD-10-CM

## 2023-08-11 ENCOUNTER — Other Ambulatory Visit
Admission: RE | Admit: 2023-08-11 | Discharge: 2023-08-11 | Disposition: A | Discharge status: Home / Self Care | Source: Ambulatory Visit | Attending: Infectious Diseases | Admitting: Infectious Diseases

## 2023-08-11 DIAGNOSIS — R600 Localized edema: Secondary | ICD-10-CM | POA: Diagnosis present

## 2023-08-11 DIAGNOSIS — I10 Essential (primary) hypertension: Secondary | ICD-10-CM | POA: Diagnosis present

## 2023-08-11 DIAGNOSIS — Z8679 Personal history of other diseases of the circulatory system: Secondary | ICD-10-CM | POA: Insufficient documentation

## 2023-08-11 DIAGNOSIS — C3490 Malignant neoplasm of unspecified part of unspecified bronchus or lung: Secondary | ICD-10-CM | POA: Diagnosis present

## 2023-08-11 DIAGNOSIS — R0902 Hypoxemia: Secondary | ICD-10-CM | POA: Insufficient documentation

## 2023-08-11 LAB — BRAIN NATRIURETIC PEPTIDE: B Natriuretic Peptide: 160.9 pg/mL — ABNORMAL HIGH (ref 0.0–100.0)

## 2023-08-12 ENCOUNTER — Ambulatory Visit
Admission: RE | Admit: 2023-08-12 | Discharge: 2023-08-12 | Disposition: A | Discharge status: Home / Self Care | Source: Ambulatory Visit | Attending: Oncology | Admitting: Oncology

## 2023-08-12 DIAGNOSIS — M7989 Other specified soft tissue disorders: Secondary | ICD-10-CM | POA: Insufficient documentation

## 2023-08-14 ENCOUNTER — Other Ambulatory Visit: Payer: Self-pay

## 2023-08-18 ENCOUNTER — Other Ambulatory Visit: Payer: Self-pay | Admitting: Oncology

## 2023-08-18 ENCOUNTER — Other Ambulatory Visit (HOSPITAL_COMMUNITY): Payer: Self-pay

## 2023-08-18 DIAGNOSIS — C3492 Malignant neoplasm of unspecified part of left bronchus or lung: Secondary | ICD-10-CM

## 2023-08-19 ENCOUNTER — Other Ambulatory Visit (HOSPITAL_COMMUNITY): Payer: Self-pay

## 2023-08-19 ENCOUNTER — Other Ambulatory Visit: Payer: Self-pay

## 2023-08-19 MED ORDER — KRAZATI 200 MG PO TABS
600.0000 mg | ORAL_TABLET | Freq: Two times a day (BID) | ORAL | 0 refills | Status: DC
  Filled 2023-08-19 – 2023-08-20 (×2): qty 180, 30d supply, fill #0

## 2023-08-20 ENCOUNTER — Other Ambulatory Visit (HOSPITAL_COMMUNITY): Payer: Self-pay

## 2023-08-20 ENCOUNTER — Other Ambulatory Visit: Payer: Self-pay

## 2023-08-20 NOTE — Progress Notes (Signed)
 Specialty Pharmacy Ongoing Clinical Assessment Note  Hannah Yu is a 78 y.o. female who is being followed by the specialty pharmacy service for RxSp Oncology   Patient's specialty medication(s) reviewed today: Adagrasib (Krazati)   Missed doses in the last 4 weeks: 0   Patient/Caregiver did not have any additional questions or concerns.   Therapeutic benefit summary: Unable to assess   Adverse events/side effects summary: No adverse events/side effects   Patient's therapy is appropriate to: Continue    Goals Addressed             This Visit's Progress    Slow Disease Progression       Patient is unable to be assessed as therapy was recently initiated. Patient will maintain adherence          Follow up:  3 months  [name] Specialty Pharmacist

## 2023-08-20 NOTE — Progress Notes (Signed)
 Specialty Pharmacy Refill Coordination Note  Hannah Yu is a 78 y.o. female contacted today regarding refills of specialty medication(s) Adagrasib (Krazati)   Patient requested [name] at [facility] at [place] date: 08/20/23   Medication will be filled on 08/20/23.

## 2023-08-21 ENCOUNTER — Other Ambulatory Visit: Payer: Self-pay | Admitting: *Deleted

## 2023-08-21 DIAGNOSIS — C3492 Malignant neoplasm of unspecified part of left bronchus or lung: Secondary | ICD-10-CM

## 2023-08-22 ENCOUNTER — Ambulatory Visit
Admission: RE | Admit: 2023-08-22 | Discharge: 2023-08-22 | Disposition: A | Discharge status: Home / Self Care | Source: Ambulatory Visit | Attending: Oncology | Admitting: Oncology

## 2023-08-22 DIAGNOSIS — C3492 Malignant neoplasm of unspecified part of left bronchus or lung: Secondary | ICD-10-CM

## 2023-08-22 DIAGNOSIS — R9389 Abnormal findings on diagnostic imaging of other specified body structures: Secondary | ICD-10-CM

## 2023-08-22 MED ORDER — GADOPICLENOL 0.5 MMOL/ML IV SOLN
8.0000 mL | Freq: Once | INTRAVENOUS | Status: AC | PRN
  Administered 2023-08-22: 8 mL via INTRAVENOUS

## 2023-08-24 ENCOUNTER — Other Ambulatory Visit (HOSPITAL_COMMUNITY): Payer: Self-pay

## 2023-08-24 ENCOUNTER — Inpatient Hospital Stay: Admitting: Pharmacist

## 2023-08-24 ENCOUNTER — Encounter: Payer: Self-pay | Admitting: *Deleted

## 2023-08-24 ENCOUNTER — Inpatient Hospital Stay

## 2023-08-24 ENCOUNTER — Other Ambulatory Visit: Payer: Self-pay

## 2023-08-24 ENCOUNTER — Encounter: Payer: Self-pay | Admitting: Oncology

## 2023-08-24 ENCOUNTER — Other Ambulatory Visit: Payer: Self-pay | Admitting: *Deleted

## 2023-08-24 ENCOUNTER — Inpatient Hospital Stay: Attending: Oncology | Admitting: Oncology

## 2023-08-24 VITALS — BP 108/68 | HR 71 | Temp 97.8°F | Resp 16 | Ht 60.0 in | Wt 179.0 lb

## 2023-08-24 DIAGNOSIS — C7931 Secondary malignant neoplasm of brain: Secondary | ICD-10-CM | POA: Insufficient documentation

## 2023-08-24 DIAGNOSIS — C3492 Malignant neoplasm of unspecified part of left bronchus or lung: Secondary | ICD-10-CM

## 2023-08-24 DIAGNOSIS — E871 Hypo-osmolality and hyponatremia: Secondary | ICD-10-CM | POA: Diagnosis not present

## 2023-08-24 DIAGNOSIS — Z87891 Personal history of nicotine dependence: Secondary | ICD-10-CM | POA: Insufficient documentation

## 2023-08-24 DIAGNOSIS — D72829 Elevated white blood cell count, unspecified: Secondary | ICD-10-CM | POA: Insufficient documentation

## 2023-08-24 DIAGNOSIS — Z923 Personal history of irradiation: Secondary | ICD-10-CM | POA: Insufficient documentation

## 2023-08-24 DIAGNOSIS — N289 Disorder of kidney and ureter, unspecified: Secondary | ICD-10-CM | POA: Diagnosis not present

## 2023-08-24 DIAGNOSIS — Z79899 Other long term (current) drug therapy: Secondary | ICD-10-CM | POA: Diagnosis not present

## 2023-08-24 DIAGNOSIS — C3432 Malignant neoplasm of lower lobe, left bronchus or lung: Secondary | ICD-10-CM | POA: Insufficient documentation

## 2023-08-24 DIAGNOSIS — Z803 Family history of malignant neoplasm of breast: Secondary | ICD-10-CM | POA: Insufficient documentation

## 2023-08-24 LAB — CBC WITH DIFFERENTIAL/PLATELET
Abs Immature Granulocytes: 0.45 10*3/uL — ABNORMAL HIGH (ref 0.00–0.07)
Basophils Absolute: 0.1 10*3/uL (ref 0.0–0.1)
Basophils Relative: 1 %
Eosinophils Absolute: 0.4 10*3/uL (ref 0.0–0.5)
Eosinophils Relative: 3 %
HCT: 40.3 % (ref 36.0–46.0)
Hemoglobin: 12.3 g/dL (ref 12.0–15.0)
Immature Granulocytes: 3 %
Lymphocytes Relative: 8 %
Lymphs Abs: 1.3 10*3/uL (ref 0.7–4.0)
MCH: 24.3 pg — ABNORMAL LOW (ref 26.0–34.0)
MCHC: 30.5 g/dL (ref 30.0–36.0)
MCV: 79.5 fL — ABNORMAL LOW (ref 80.0–100.0)
Monocytes Absolute: 1.6 10*3/uL — ABNORMAL HIGH (ref 0.1–1.0)
Monocytes Relative: 10 %
Neutro Abs: 12.2 10*3/uL — ABNORMAL HIGH (ref 1.7–7.7)
Neutrophils Relative %: 75 %
Platelets: 383 10*3/uL (ref 150–400)
RBC: 5.07 MIL/uL (ref 3.87–5.11)
RDW: 16.7 % — ABNORMAL HIGH (ref 11.5–15.5)
WBC: 16.1 10*3/uL — ABNORMAL HIGH (ref 4.0–10.5)
nRBC: 0 % (ref 0.0–0.2)

## 2023-08-24 LAB — CMP (CANCER CENTER ONLY)
ALT: 22 U/L (ref 0–44)
AST: 20 U/L (ref 15–41)
Albumin: 3.7 g/dL (ref 3.5–5.0)
Alkaline Phosphatase: 106 U/L (ref 38–126)
Anion gap: 12 (ref 5–15)
BUN: 41 mg/dL — ABNORMAL HIGH (ref 8–23)
CO2: 22 mmol/L (ref 22–32)
Calcium: 8.6 mg/dL — ABNORMAL LOW (ref 8.9–10.3)
Chloride: 97 mmol/L — ABNORMAL LOW (ref 98–111)
Creatinine: 2.37 mg/dL — ABNORMAL HIGH (ref 0.44–1.00)
GFR, Estimated: 20 mL/min — ABNORMAL LOW (ref >60)
Glucose, Bld: 104 mg/dL — ABNORMAL HIGH (ref 70–99)
Potassium: 4.8 mmol/L (ref 3.5–5.1)
Sodium: 131 mmol/L — ABNORMAL LOW (ref 135–145)
Total Bilirubin: 1 mg/dL (ref 0.0–1.2)
Total Protein: 7 g/dL (ref 6.5–8.1)

## 2023-08-24 NOTE — Progress Notes (Signed)
 Wondering about when her next port flush is due and if she still needs her port.

## 2023-08-24 NOTE — Progress Notes (Signed)
 Clinical Pharmacist Practitioner Clinic [facility]  [phone] [phone]  Patient Care Team: Eartha Gold, MD as PCP - General (Infectious Diseases) Drake Gens, RN as Oncology Nurse Navigator Glenis Langdon, MD as Consulting Physician (Radiation Oncology) Shellie Dials, MD as Consulting Physician (Oncology)   Name of the patient: Hannah Yu  [id number]  06-22-1945   Date of visit: 08/24/23  HPI: Patient is a 78 y.o. female with ***  Reason for Consult: Oral chemotherapy follow-up for *** therapy.   PAST MEDICAL HISTORY: Past Medical History:  Diagnosis Date   Acid reflux    Anemia    Anesthesia complication    Tachycardia previously, now on metoprolol    Arthritis    09/02/2019: per patient "have it real bad in both hands and back"   Difficult intubation    had to terminate intubation unable to advance tube for cholecystectomy 2017?   DVT (deep venous thrombosis) (HCC)    leg   Hypertension    Sciatica    09/02/2019: has had it for about 3 years    HEMATOLOGY/ONCOLOGY HISTORY:  Oncology History  Adenocarcinoma of left lung (HCC)  03/19/2023 Initial Diagnosis   Adenocarcinoma of left lung (HCC)   03/19/2023 Cancer Staging   Staging form: Lung, AJCC 8th Edition - Clinical stage from 03/19/2023: Stage IVB (cTX, cN2, cM1c) - Signed by Shellie Dials, MD on 03/19/2023 Stage prefix: Initial diagnosis   05/07/2023 - 07/16/2023 Chemotherapy   Patient is on Treatment Plan : LUNG NSCLC Pembrolizumab  (200) q21d       ALLERGIES:  is allergic to ezetimibe, codeine, cortisone, and lovastatin.  MEDICATIONS:  Current Outpatient Medications  Medication Sig Dispense Refill   adagrasib  (KRAZATI ) 200 MG tablet Take 3 tablets (600 mg total) by mouth 2 (two) times daily. 180 tablet 0   ALPRAZolam (XANAX) 0.25 MG tablet Take 0.25 mg by mouth at bedtime as needed.     aspirin  EC 81 MG tablet Take 162 mg by mouth daily.  Swallow whole.     cholecalciferol  (VITAMIN D3) 25 MCG (1000 UNIT) tablet Take 1,000 Units by mouth daily.     FIBER PO Take 1 tablet by mouth daily.     furosemide  (LASIX ) 40 MG tablet Take 40 mg by mouth daily.     ibuprofen (ADVIL) 200 MG tablet Take 400 mg by mouth every 6 (six) hours as needed for mild pain or moderate pain.     Ibuprofen-diphenhydrAMINE  HCl (IBUPROFEN PM) 200-25 MG CAPS Take 2 tablets by mouth at bedtime as needed (sleep).     losartan  (COZAAR ) 100 MG tablet Take 100 mg by mouth daily.     magnesium oxide (MAG-OX) 400 (240 Mg) MG tablet Take 400 mg by mouth daily.     metoprolol  succinate (TOPROL -XL) 50 MG 24 hr tablet Take 50 mg by mouth daily.     omeprazole (PRILOSEC) 40 MG capsule Take 40 mg by mouth daily as needed.     OVER THE COUNTER MEDICATION Take 1 capsule by mouth daily. CBD gummie     prochlorperazine  (COMPAZINE ) 10 MG tablet Take 1 tablet (10 mg total) by mouth every 6 (six) hours as needed for nausea or vomiting. 30 tablet 2   Tetrahydrozoline HCl (VISINE OP) Place 1 drop into both eyes daily as needed (dry eyes).     vitamin B-12 (CYANOCOBALAMIN ) 1000 MCG tablet Take 1,000 mcg by mouth daily.     No current facility-administered medications for this visit.  VITAL SIGNS: There were no vitals taken for this visit. There were no vitals filed for this visit.  Estimated body mass index is 34.96 kg/m as calculated from the following:   Height as of an earlier encounter on 08/24/23: 5' (1.524 m).   Weight as of an earlier encounter on 08/24/23: 81.2 kg (179 lb).  LABS: CBC:    Component Value Date/Time   WBC 16.1 (H) 08/24/2023 1340   HGB 12.3 08/24/2023 1340   HGB 10.9 (L) 07/16/2023 0936   HCT 40.3 08/24/2023 1340   PLT 383 08/24/2023 1340   PLT 503 (H) 07/16/2023 0936   MCV 79.5 (L) 08/24/2023 1340   NEUTROABS 12.2 (H) 08/24/2023 1340   LYMPHSABS 1.3 08/24/2023 1340   MONOABS 1.6 (H) 08/24/2023 1340   EOSABS 0.4 08/24/2023 1340   BASOSABS  0.1 08/24/2023 1340   Comprehensive Metabolic Panel:    Component Value Date/Time   NA 131 (L) 08/24/2023 1339   K 4.8 08/24/2023 1339   CL 97 (L) 08/24/2023 1339   CO2 22 08/24/2023 1339   BUN 41 (H) 08/24/2023 1339   CREATININE 2.37 (H) 08/24/2023 1339   GLUCOSE 104 (H) 08/24/2023 1339   CALCIUM 8.6 (L) 08/24/2023 1339   AST 20 08/24/2023 1339   ALT 22 08/24/2023 1339   ALKPHOS 106 08/24/2023 1339   BILITOT 1.0 08/24/2023 1339   PROT 7.0 08/24/2023 1339   ALBUMIN  3.7 08/24/2023 1339     Present during today's visit: ***  Assessment and Plan: ***   Oral Chemotherapy Side Effect/Intolerance:  ***  Oral Chemotherapy Adherence: *** *** patient barriers to medication adherence identified.   New medications: ***  Medication Access Issues: ***  Patient expressed understanding and was in agreement with this plan. She also understands that She can call clinic at any time with any questions, concerns, or complaints.   Follow-up plan: ***  Thank you for allowing me to participate in the care of this very pleasant patient.   Time Total: ***  Visit consisted of counseling and education on dealing with issues of symptom management in the setting of serious and potentially life-threatening illness.Greater than 50%  of this time was spent counseling and coordinating care related to the above assessment and plan.  Signed by: Triva Hueber N. Kanda Deluna, PharmD, Lorraine Roses, CPP Hematology/Oncology Clinical Pharmacist Practitioner [place] [phone]  08/24/2023 4:32 PM

## 2023-08-25 NOTE — Progress Notes (Signed)
 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  INTERVAL HISTORY: Patient returns to clinic today for further evaluation and to assess her toleration of Adagrasib .  She continues to be anxious, but otherwise feels well.  She is tolerating her treatment without significant side effects.  Recent MRI of the brain revealed a new metastatic lesion, but patient is asymptomatic and has no neurologic complaints.  She denies any recent fevers.  She has a good appetite and denies weight loss.  She denies any chest pain, shortness of breath, cough, or hemoptysis.  She denies any nausea, vomiting, constipation, or diarrhea.  She has no urinary complaints.  Patient offers no further specific complaints today.  REVIEW OF SYSTEMS:   Review of Systems  Constitutional: Negative.  Negative for fever, malaise/fatigue and weight loss.  Respiratory: Negative.  Negative for cough, hemoptysis and shortness of breath.   Cardiovascular: Negative.  Negative for chest pain and leg swelling.  Gastrointestinal: Negative.  Negative for abdominal pain.  Genitourinary: Negative.  Negative for dysuria.  Musculoskeletal: Negative.  Negative for back pain and joint pain.  Skin: Negative.  Negative for rash.  Neurological: Negative.  Negative for dizziness, focal weakness, weakness and headaches.  Psychiatric/Behavioral:  The patient is nervous/anxious.     As per HPI. Otherwise, a complete review of systems is negative.  PAST MEDICAL HISTORY: Past Medical History:   Diagnosis Date   Acid reflux    Anemia    Anesthesia complication    Tachycardia previously, now on metoprolol    Arthritis    09/02/2019: per patient "have it real bad in both hands and back"   Difficult intubation    had to terminate intubation unable to advance tube for cholecystectomy 2017?   DVT (deep venous thrombosis) (HCC)    leg   Hypertension    Sciatica    09/02/2019: has had it for about 3 years    PAST SURGICAL HISTORY: Past Surgical History:  Procedure Laterality Date   ABDOMINAL HYSTERECTOMY     CATARACT EXTRACTION W/ INTRAOCULAR LENS IMPLANT Bilateral 2007   eyes done within 1 month of each other.   IR IMAGING GUIDED PORT INSERTION  05/19/2023   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   TOTAL HIP ARTHROPLASTY Right 09/06/2019   Procedure: RIGHT TOTAL HIP ARTHROPLASTY ANTERIOR APPROACH;  Surgeon: Arnie Lao, MD;  Location: [facility] OR;  Service: Orthopedics;  Laterality: Right;   TOTAL HIP ARTHROPLASTY Left 07/31/2020   Procedure: LEFT TOTAL HIP ARTHROPLASTY ANTERIOR APPROACH;  Surgeon: Arnie Lao, MD;  Location: [facility] OR;  Service: Orthopedics;  Laterality: Left;   VIDEO BRONCHOSCOPY WITH ENDOBRONCHIAL ULTRASOUND N/A 10/06/2022   Procedure: VIDEO BRONCHOSCOPY WITH ENDOBRONCHIAL ULTRASOUND;  Surgeon: Erskin Hearing, MD;  Location: [facility] ORS;  Service: Thoracic;  Laterality: N/A;    FAMILY HISTORY: Family History  Problem Relation Age of Onset   Breast cancer Sister 23    ADVANCED DIRECTIVES (Y/N):  N  HEALTH MAINTENANCE: Social History   Tobacco Use   Smoking status: Former    Current packs/day:  0.00    Types: Cigarettes    Quit date: 01/02/1993    Years since quitting: 30.6   Smokeless tobacco: Never  Vaping Use   Vaping status: Never Used  Substance Use Topics   Alcohol use: Yes    Alcohol/week: 4.0 standard drinks of alcohol    Types: 4 Glasses of wine per week     Comment: 09/02/2019: per patient "ever so often"   Drug use: No     Colonoscopy:  PAP:  Bone density:  Lipid panel:  Allergies  Allergen Reactions   Ezetimibe Other (See Comments)    Body aches, and stiffness   Codeine Diarrhea   Cortisone Swelling    injections   Lovastatin Other (See Comments)    Muscle cramps    Current Outpatient Medications  Medication Sig Dispense Refill   adagrasib  (KRAZATI ) 200 MG tablet Take 3 tablets (600 mg total) by mouth 2 (two) times daily. 180 tablet 0   ALPRAZolam (XANAX) 0.25 MG tablet Take 0.25 mg by mouth at bedtime as needed.     aspirin  EC 81 MG tablet Take 162 mg by mouth daily. Swallow whole.     cholecalciferol  (VITAMIN D3) 25 MCG (1000 UNIT) tablet Take 1,000 Units by mouth daily.     FIBER PO Take 1 tablet by mouth daily.     furosemide  (LASIX ) 40 MG tablet Take 40 mg by mouth daily.     ibuprofen (ADVIL) 200 MG tablet Take 400 mg by mouth every 6 (six) hours as needed for mild pain or moderate pain.     Ibuprofen-diphenhydrAMINE  HCl (IBUPROFEN PM) 200-25 MG CAPS Take 2 tablets by mouth at bedtime as needed (sleep).     losartan  (COZAAR ) 100 MG tablet Take 100 mg by mouth daily.     magnesium oxide (MAG-OX) 400 (240 Mg) MG tablet Take 400 mg by mouth daily.     metoprolol  succinate (TOPROL -XL) 50 MG 24 hr tablet Take 50 mg by mouth daily.     omeprazole (PRILOSEC) 40 MG capsule Take 40 mg by mouth daily as needed.     OVER THE COUNTER MEDICATION Take 1 capsule by mouth daily. CBD gummie     prochlorperazine  (COMPAZINE ) 10 MG tablet Take 1 tablet (10 mg total) by mouth every 6 (six) hours as needed for nausea or vomiting. 30 tablet 2   Tetrahydrozoline HCl (VISINE OP) Place 1 drop into both eyes daily as needed (dry eyes).     vitamin B-12 (CYANOCOBALAMIN ) 1000 MCG tablet Take 1,000 mcg by mouth daily.     No current facility-administered medications for this visit.    OBJECTIVE: Vitals:   08/24/23 1348  BP: 108/68  Pulse: 71   Resp: 16  Temp: 97.8 F (36.6 C)  SpO2: 96%      Body mass index is 34.96 kg/m.    ECOG FS:1 - Symptomatic but completely ambulatory  General: Well-developed, well-nourished, no acute distress. Eyes: Pink conjunctiva, anicteric sclera. HEENT: Normocephalic, moist mucous membranes. Lungs: No audible wheezing or coughing. Heart: Regular rate and rhythm. Abdomen: Soft, nontender, no obvious distention. Musculoskeletal: No edema, cyanosis, or clubbing. Neuro: Alert, answering all questions appropriately. Cranial nerves grossly intact. Skin: No rashes or petechiae noted. Psych: Normal affect.  LAB RESULTS:  Lab Results  Component Value Date   NA 131 (L) 08/24/2023   K 4.8 08/24/2023   CL 97 (L) 08/24/2023   CO2 22 08/24/2023   GLUCOSE 104 (H) 08/24/2023   BUN 41 (H) 08/24/2023  CREATININE 2.37 (H) 08/24/2023   CALCIUM 8.6 (L) 08/24/2023   PROT 7.0 08/24/2023   ALBUMIN  3.7 08/24/2023   AST 20 08/24/2023   ALT 22 08/24/2023   ALKPHOS 106 08/24/2023   BILITOT 1.0 08/24/2023   GFRNONAA 20 (L) 08/24/2023   GFRAA >60 09/10/2019    Lab Results  Component Value Date   WBC 16.1 (H) 08/24/2023   NEUTROABS 12.2 (H) 08/24/2023   HGB 12.3 08/24/2023   HCT 40.3 08/24/2023   MCV 79.5 (L) 08/24/2023   PLT 383 08/24/2023     STUDIES: MR Brain W Wo Contrast Result Date: 08/22/2023 CLINICAL DATA:  Lung cancer.  Abnormal findings 3 weeks ago. EXAM: MRI HEAD WITHOUT AND WITH CONTRAST TECHNIQUE: Multiplanar, multiecho pulse sequences of the brain and surrounding structures were obtained without and with intravenous contrast. CONTRAST:  8 cc Vueway  COMPARISON:  Head CT 10/14/2022 FINDINGS: Brain: Diffusion imaging does not show any acute or subacute infarction or other cause of restricted diffusion. No focal abnormality affects the brainstem. There is an enhancing mass of the lateral cerebellum on the left measuring up to 10 mm in diameter consistent with a metastasis. Minimal  surrounding edema. Cerebral hemispheres show moderate chronic small-vessel ischemic changes of the white matter. No large vessel territory stroke. No evidence of supra tentorial metastatic lesion. Vascular: Major vessels at the base of the brain show flow. Skull and upper cervical spine: Negative Sinuses/Orbits: Clear/normal Other: None IMPRESSION: 1. 10 mm enhancing mass of the lateral cerebellum on the left consistent with a metastasis. Minimal surrounding edema. No evidence of supra tentorial metastatic lesion. 2. Moderate chronic small-vessel ischemic changes of the cerebral hemispheric white matter. Electronically Signed   By: Bettylou Brunner M.D.   On: 08/22/2023 14:49   US  Venous Img Lower Bilateral Result Date: 08/12/2023 CLINICAL DATA:  Leg swelling.  Prior history of DVT. EXAM: Bilateral lower Extremity Venous Doppler Ultrasound TECHNIQUE: Gray-scale sonography with compression, as well as color and duplex ultrasound, were performed to evaluate the deep venous system(s) from the level of the common femoral vein through the popliteal and proximal calf veins. COMPARISON:  None available FINDINGS: VENOUS Normal compressibility of the common femoral, superficial femoral, and popliteal veins, as well as the visualized calf veins. Visualized portions of profunda femoral vein and great saphenous vein unremarkable. No filling defects to suggest DVT on grayscale or color Doppler imaging. Doppler waveforms show normal direction of venous flow, normal respiratory plasticity and response to augmentation. OTHER None. Limitations: none IMPRESSION: No  lower extremity DVT. Electronically Signed   By: Elester Grim M.D.   On: 08/12/2023 16:36   ONCOLOGY HISTORY: Patient completed SBRT to the 2.7 cm irregular left lower lobe nodule in July 2024.  PET scan results from February 13, 2023 reviewed independently with residual low level of activity suggesting treatment response in the left lower lobe.  The AP window lymph node  seen on subsequent CT scan revealed progressive enlargement now measuring 1.7 x 1.3 cm as well as increased hypermetabolic activity.  Patient completed XRT to this lesion.  Patient has also completed XRT to her left pelvis.    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  Patient initiated Keytruda  on May 07, 2023.  But given her progressive disease, will switch treatment to Adagrasib  (Krazati ) 600 mg twice daily given  her K-ras mutation.  MRI of the brain on August 22, 2023 revealed a 1 cm left lateral cerebellum lesion and a referral has been sent back to radiation oncology for treatment.  Given patient's elevated creatinine, will hold treatment 1 week.  Return to clinic in 1 week for repeat laboratory work, further evaluation, and consideration of reinitiating treatment.  Appreciate clinical pharmacy input. 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 Renal insufficiency: Patient's creatinine is increased to 2.37, although patient states she has been adjusting her dose of Lasix .  Hold treatment as above.  Monitor. Hyponatremia: Patient sodium level has decreased to 131, possibly secondary to increased dose of Lasix .  Monitor. Leukocytosis: Likely reactive, monitor. Shortness of breath/cough: Chronic and unchanged.  Patient does not complain of this today.    Patient expressed understanding and was in agreement with this plan. She also understands that She can call clinic at any time with any questions, concerns, or complaints.    Cancer Staging  Adenocarcinoma of left lung [facility]) Staging form: Lung, AJCC 8th Edition - Clinical stage from 03/19/2023: Stage IVB (cTX, cN2, cM1c) - Signed by Shellie Dials, MD on 03/19/2023 Stage prefix: Initial diagnosis   Shellie Dials, MD   08/25/2023 4:24  PM

## 2023-08-27 ENCOUNTER — Other Ambulatory Visit

## 2023-08-27 ENCOUNTER — Ambulatory Visit: Admitting: Oncology

## 2023-08-27 ENCOUNTER — Ambulatory Visit

## 2023-08-31 ENCOUNTER — Inpatient Hospital Stay: Admitting: Oncology

## 2023-08-31 ENCOUNTER — Inpatient Hospital Stay

## 2023-08-31 ENCOUNTER — Inpatient Hospital Stay: Admitting: Pharmacist

## 2023-09-02 ENCOUNTER — Telehealth: Payer: Self-pay | Admitting: *Deleted

## 2023-09-02 ENCOUNTER — Ambulatory Visit
Admission: RE | Admit: 2023-09-02 | Discharge: 2023-09-02 | Disposition: A | Discharge status: Home / Self Care | Source: Ambulatory Visit | Attending: Radiation Oncology | Admitting: Radiation Oncology

## 2023-09-02 ENCOUNTER — Other Ambulatory Visit: Payer: Self-pay | Admitting: *Deleted

## 2023-09-02 DIAGNOSIS — C7931 Secondary malignant neoplasm of brain: Secondary | ICD-10-CM | POA: Insufficient documentation

## 2023-09-02 DIAGNOSIS — C3492 Malignant neoplasm of unspecified part of left bronchus or lung: Secondary | ICD-10-CM

## 2023-09-02 MED ORDER — GADOBUTROL 1 MMOL/ML IV SOLN
8.0000 mL | Freq: Once | INTRAVENOUS | Status: AC | PRN
  Administered 2023-09-02: 8 mL via INTRAVENOUS

## 2023-09-02 NOTE — Telephone Encounter (Signed)
 The patient says she just had a MRI of the brain on the 19th in [place] and now she has to come again and get another MRI and she does not do the MRIs here because it is not an open MRI so she has to have it done in [place].  I called Kim in radiation told her about this information and she is going to put in for another MRI in [place] and get in touch with the patient to let her know also

## 2023-09-03 ENCOUNTER — Institutional Professional Consult (permissible substitution): Admitting: Radiation Oncology

## 2023-09-03 ENCOUNTER — Inpatient Hospital Stay

## 2023-09-03 ENCOUNTER — Other Ambulatory Visit: Payer: Self-pay | Admitting: *Deleted

## 2023-09-03 ENCOUNTER — Ambulatory Visit
Admission: RE | Admit: 2023-09-03 | Discharge: 2023-09-03 | Disposition: A | Discharge status: Home / Self Care | Source: Ambulatory Visit | Attending: Radiation Oncology | Admitting: Radiation Oncology

## 2023-09-03 ENCOUNTER — Encounter: Payer: Self-pay | Admitting: Radiation Oncology

## 2023-09-03 ENCOUNTER — Encounter: Payer: Self-pay | Admitting: Oncology

## 2023-09-03 ENCOUNTER — Inpatient Hospital Stay (HOSPITAL_BASED_OUTPATIENT_CLINIC_OR_DEPARTMENT_OTHER): Admitting: Oncology

## 2023-09-03 ENCOUNTER — Inpatient Hospital Stay: Admitting: Pharmacist

## 2023-09-03 VITALS — BP 136/62 | HR 88 | Temp 98.3°F | Resp 18 | Ht 60.0 in | Wt 177.8 lb

## 2023-09-03 DIAGNOSIS — C3432 Malignant neoplasm of lower lobe, left bronchus or lung: Secondary | ICD-10-CM | POA: Insufficient documentation

## 2023-09-03 DIAGNOSIS — Z87891 Personal history of nicotine dependence: Secondary | ICD-10-CM | POA: Insufficient documentation

## 2023-09-03 DIAGNOSIS — Z510 Encounter for antineoplastic radiation therapy: Secondary | ICD-10-CM | POA: Diagnosis present

## 2023-09-03 DIAGNOSIS — C3490 Malignant neoplasm of unspecified part of unspecified bronchus or lung: Secondary | ICD-10-CM | POA: Diagnosis present

## 2023-09-03 DIAGNOSIS — Z79899 Other long term (current) drug therapy: Secondary | ICD-10-CM | POA: Insufficient documentation

## 2023-09-03 DIAGNOSIS — C7931 Secondary malignant neoplasm of brain: Secondary | ICD-10-CM

## 2023-09-03 DIAGNOSIS — C3492 Malignant neoplasm of unspecified part of left bronchus or lung: Secondary | ICD-10-CM

## 2023-09-03 LAB — CMP (CANCER CENTER ONLY)
ALT: 23 U/L (ref 0–44)
AST: 30 U/L (ref 15–41)
Albumin: 3.6 g/dL (ref 3.5–5.0)
Alkaline Phosphatase: 109 U/L (ref 38–126)
Anion gap: 9 (ref 5–15)
BUN: 27 mg/dL — ABNORMAL HIGH (ref 8–23)
CO2: 24 mmol/L (ref 22–32)
Calcium: 8.9 mg/dL (ref 8.9–10.3)
Chloride: 101 mmol/L (ref 98–111)
Creatinine: 1.05 mg/dL — ABNORMAL HIGH (ref 0.44–1.00)
GFR, Estimated: 54 mL/min — ABNORMAL LOW (ref >60)
Glucose, Bld: 147 mg/dL — ABNORMAL HIGH (ref 70–99)
Potassium: 3.8 mmol/L (ref 3.5–5.1)
Sodium: 134 mmol/L — ABNORMAL LOW (ref 135–145)
Total Bilirubin: 0.6 mg/dL (ref 0.0–1.2)
Total Protein: 7.4 g/dL (ref 6.5–8.1)

## 2023-09-03 LAB — CBC WITH DIFFERENTIAL/PLATELET
Abs Immature Granulocytes: 0.13 10*3/uL — ABNORMAL HIGH (ref 0.00–0.07)
Basophils Absolute: 0.1 10*3/uL (ref 0.0–0.1)
Basophils Relative: 1 %
Eosinophils Absolute: 0.2 10*3/uL (ref 0.0–0.5)
Eosinophils Relative: 3 %
HCT: 39.6 % (ref 36.0–46.0)
Hemoglobin: 12.2 g/dL (ref 12.0–15.0)
Immature Granulocytes: 1 %
Lymphocytes Relative: 20 %
Lymphs Abs: 1.8 10*3/uL (ref 0.7–4.0)
MCH: 24.2 pg — ABNORMAL LOW (ref 26.0–34.0)
MCHC: 30.8 g/dL (ref 30.0–36.0)
MCV: 78.4 fL — ABNORMAL LOW (ref 80.0–100.0)
Monocytes Absolute: 1 10*3/uL (ref 0.1–1.0)
Monocytes Relative: 11 %
Neutro Abs: 5.8 10*3/uL (ref 1.7–7.7)
Neutrophils Relative %: 64 %
Platelets: 426 10*3/uL — ABNORMAL HIGH (ref 150–400)
RBC: 5.05 MIL/uL (ref 3.87–5.11)
RDW: 16.9 % — ABNORMAL HIGH (ref 11.5–15.5)
WBC: 9.1 10*3/uL (ref 4.0–10.5)
nRBC: 0 % (ref 0.0–0.2)

## 2023-09-03 MED ORDER — DEXAMETHASONE 2 MG PO TABS: 2.0000 mg | ORAL_TABLET | Freq: Two times a day (BID) | ORAL | 0 refills | Status: DC

## 2023-09-03 NOTE — Progress Notes (Signed)
 Clinical Pharmacist Practitioner Clinic [facility]  Telephone:([phone] Fax:([phone]  Patient Care Team: Eartha Gold, MD as PCP - General (Infectious Diseases) Drake Gens, RN as Oncology Nurse Navigator Glenis Langdon, MD as Consulting Physician (Radiation Oncology) Shellie Dials, MD as Consulting Physician (Oncology)   Name of the patient: Hannah Yu  [id number]  20-Nov-1945   Date of visit: 09/03/23  HPI: Patient is a 78 y.o. female with  progressive NSCLC, KRAS G12C mutation positive ([place] 03/3023). Patient started adagrasib  on 08/20/23.   Reason for Consult: Oral chemotherapy follow-up for adagrasib  therapy.   PAST MEDICAL HISTORY: Past Medical History:  Diagnosis Date   Acid reflux    Anemia    Anesthesia complication    Tachycardia previously, now on metoprolol    Arthritis    09/02/2019: per patient "have it real bad in both hands and back"   Difficult intubation    had to terminate intubation unable to advance tube for cholecystectomy 2017?   DVT (deep venous thrombosis) (HCC)    leg   Hypertension    Sciatica    09/02/2019: has had it for about 3 years    HEMATOLOGY/ONCOLOGY HISTORY:  Oncology History  Adenocarcinoma of left lung (HCC)  03/19/2023 Initial Diagnosis   Adenocarcinoma of left lung (HCC)   03/19/2023 Cancer Staging   Staging form: Lung, AJCC 8th Edition - Clinical stage from 03/19/2023: Stage IVB (cTX, cN2, cM1c) - Signed by Shellie Dials, MD on 03/19/2023 Stage prefix: Initial diagnosis   05/07/2023 - 07/16/2023 Chemotherapy   Patient is on Treatment Plan : LUNG NSCLC Pembrolizumab  (200) q21d       ALLERGIES:  is allergic to ezetimibe, codeine, cortisone, and lovastatin.  MEDICATIONS:  Current Outpatient Medications  Medication Sig Dispense Refill   adagrasib  (KRAZATI ) 200 MG tablet Take 3 tablets (600 mg total) by mouth 2 (two) times daily. (Patient not taking: Reported on 09/03/2023)  180 tablet 0   ALPRAZolam (XANAX) 0.25 MG tablet Take 0.25 mg by mouth at bedtime as needed.     aspirin  EC 81 MG tablet Take 162 mg by mouth daily. Swallow whole.     cholecalciferol  (VITAMIN D3) 25 MCG (1000 UNIT) tablet Take 1,000 Units by mouth daily.     FIBER PO Take 1 tablet by mouth daily.     furosemide  (LASIX ) 40 MG tablet Take 40 mg by mouth daily.     ibuprofen (ADVIL) 200 MG tablet Take 400 mg by mouth every 6 (six) hours as needed for mild pain or moderate pain.     Ibuprofen-diphenhydrAMINE  HCl (IBUPROFEN PM) 200-25 MG CAPS Take 2 tablets by mouth at bedtime as needed (sleep).     losartan  (COZAAR ) 100 MG tablet Take 100 mg by mouth daily.     magnesium oxide (MAG-OX) 400 (240 Mg) MG tablet Take 400 mg by mouth daily.     metoprolol  succinate (TOPROL -XL) 50 MG 24 hr tablet Take 50 mg by mouth daily.     omeprazole (PRILOSEC) 40 MG capsule Take 40 mg by mouth daily as needed.     OVER THE COUNTER MEDICATION Take 1 capsule by mouth daily. CBD gummie     prochlorperazine  (COMPAZINE ) 10 MG tablet Take 1 tablet (10 mg total) by mouth every 6 (six) hours as needed for nausea or vomiting. 30 tablet 2   Tetrahydrozoline HCl (VISINE OP) Place 1 drop into both eyes daily as needed (dry eyes).     vitamin B-12 (CYANOCOBALAMIN ) 1000 MCG  tablet Take 1,000 mcg by mouth daily.     No current facility-administered medications for this visit.    VITAL SIGNS: There were no vitals taken for this visit. There were no vitals filed for this visit.  Estimated body mass index is 34.72 kg/m as calculated from the following:   Height as of an earlier encounter on 09/03/23: 5' (1.524 m).   Weight as of an earlier encounter on 09/03/23: 80.6 kg (177 lb 12.8 oz).  LABS: CBC:    Component Value Date/Time   WBC 9.1 09/03/2023 0836   HGB 12.2 09/03/2023 0836   HGB 10.9 (L) 07/16/2023 0936   HCT 39.6 09/03/2023 0836   PLT 426 (H) 09/03/2023 0836   PLT 503 (H) 07/16/2023 0936   MCV 78.4 (L) 09/03/2023  0836   NEUTROABS 5.8 09/03/2023 0836   LYMPHSABS 1.8 09/03/2023 0836   MONOABS 1.0 09/03/2023 0836   EOSABS 0.2 09/03/2023 0836   BASOSABS 0.1 09/03/2023 0836   Comprehensive Metabolic Panel:    Component Value Date/Time   NA 134 (L) 09/03/2023 0836   K 3.8 09/03/2023 0836   CL 101 09/03/2023 0836   CO2 24 09/03/2023 0836   BUN 27 (H) 09/03/2023 0836   CREATININE 1.05 (H) 09/03/2023 0836   GLUCOSE 147 (H) 09/03/2023 0836   CALCIUM 8.9 09/03/2023 0836   AST 30 09/03/2023 0836   ALT 23 09/03/2023 0836   ALKPHOS 109 09/03/2023 0836   BILITOT 0.6 09/03/2023 0836   PROT 7.4 09/03/2023 0836   ALBUMIN  3.6 09/03/2023 0836     Present during today's visit: patient and her friend [name]  Assessment and Plan: CBC/CMP reviewed, SCr returned closer to baseline 1.05mg /dL Patient resume adagrasib  at original dose 600mg  twice daily. She knows if Serum creatinine increase returns we will consider dose reducing to 400mg  twice daily She reported continued increased furosemide  dosing (40mg  and 80mg  daily alternating)    Oral Chemotherapy Side Effect/Intolerance:  N/A medication has been on hold  Oral Chemotherapy Adherence: N/A medication has been on hold No patient barriers to medication adherence identified.   New medications: None reported  Medication Access Issues: No issues, patient fills at [facility]Special[facility]   Patient expressed understanding and was in agreement with this plan. She also understands that She can call clinic at any time with any questions, concerns, or complaints.   Follow-up plan: RTC in one week, labs and telephone visit  Thank you for allowing me to participate in the care of this very pleasant patient.   Time Total: 10 mins  Visit consisted of counseling and education on dealing with issues of symptom management in the setting of serious and potentially life-threatening illness.Greater than 50%  of this time was spent counseling and coordinating  care related to the above assessment and plan.  Signed by: Channel Papandrea N. Sidra Oldfield, PharmD, Hannah Yu, CPP Hematology/Oncology Clinical Pharmacist Practitioner [facility] [phone]  09/03/2023 9:37 AM

## 2023-09-03 NOTE — Progress Notes (Signed)
 Radiation Oncology Follow up Note  Name: Hannah Yu   Date:   09/03/2023 MRN:  [id number] DOB: 1945-07-18    This 78 y.o. 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  REFERRING PROVIDER: Eartha Gold, MD  HPI: Patient is a 78 year old female well-known to our department having received palliative radiation therapy to her pelvis as well as multiple lung lesions treated with SBRT for stage IVb adenocarcinoma of the lung.  She has been on Keytruda .  Although recent MRI of the brain was performed showing a 1 cm enhancing mass in the lateral cerebellum on the left consistent with metastatic disease with minimal surrounding edema.  No other evidence of supratentorial metastatic lesions noted.  She is fairly asymptomatic occasionally does have a headache no change in visual fields no focal neurologic deficits noted trouble with balance.  Air other areas of prior treatment including metastatic disease to her pelvis she seems to have benefited significantly with very little pain at this time.  She is seen today for radiation oncology evaluation for SRS  COMPLICATIONS OF TREATMENT: none  FOLLOW UP COMPLIANCE: keeps appointments   PHYSICAL EXAM:  There were no vitals taken for this visit. Crude visual fields are within normal range proprioception is intact motor sensory and Diette levels are equal and symmetric in the upper lower extremities.  Well-developed well-nourished patient in NAD. HEENT reveals PERLA, EOMI, discs not visualized.  Oral cavity is clear. No oral mucosal lesions are identified. Neck is clear without evidence of cervical or supraclavicular adenopathy. Lungs are clear to A&P. Cardiac examination is essentially unremarkable with regular rate and rhythm without murmur rub or thrill. Abdomen is benign with no organomegaly or  masses noted. Motor sensory and DTR levels are equal and symmetric in the upper and lower extremities. Cranial nerves II through XII are grossly intact. Proprioception is intact. No peripheral adenopathy or edema is identified. No motor or sensory levels are noted. Crude visual fields are within normal range.  RADIOLOGY RESULTS: MRI and thickened MRI reviewed compatible with above-stated findings showing only solitary lesion in the left cerebellum in 78 year old female  PLAN: At this time elect go with SRS.  Will plan on 20 Gray in 1 fraction.  Risks and benefits of treatment including possible hair loss fatigue skin changes all were described in detail to the patient.  We are simulating her today we will plan on her single treatment next week.  Will also start her on low-dose Decadron  prior to her treatment.  Patient comprehends my recommendations well.  I would like to take this opportunity to thank you for allowing me to participate in the care of your patient.Glenis Langdon, MD

## 2023-09-03 NOTE — Progress Notes (Signed)
 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  INTERVAL HISTORY: Patient returns to clinic today for repeat laboratory, further evaluation, and reinitiation of Adagrasib .  She currently feels well and is asymptomatic.  She has no neurologic complaints.  She denies any recent fevers.  She has a good appetite and denies weight loss.  She denies any chest pain, shortness of breath, cough, or hemoptysis.  She denies any nausea, vomiting, constipation, or diarrhea.  She has no urinary complaints.  Patient offers no specific complaints today.  REVIEW OF SYSTEMS:   Review of Systems  Constitutional: Negative.  Negative for fever, malaise/fatigue and weight loss.  Respiratory: Negative.  Negative for cough, hemoptysis and shortness of breath.   Cardiovascular: Negative.  Negative for chest pain and leg swelling.  Gastrointestinal: Negative.  Negative for abdominal pain.  Genitourinary: Negative.  Negative for dysuria.  Musculoskeletal: Negative.  Negative for back pain and joint pain.  Skin: Negative.  Negative for rash.  Neurological: Negative.  Negative for dizziness, focal weakness, weakness and headaches.  Psychiatric/Behavioral: Negative.  The patient is not nervous/anxious.     As per HPI. Otherwise, a complete review of systems is negative.  PAST MEDICAL HISTORY: Past Medical History:  Diagnosis Date   Acid reflux    Anemia    Anesthesia complication    Tachycardia previously, now on metoprolol    Arthritis    09/02/2019: per  patient "have it real bad in both hands and back"   Difficult intubation    had to terminate intubation unable to advance tube for cholecystectomy 2017?   DVT (deep venous thrombosis) (HCC)    leg   Hypertension    Sciatica    09/02/2019: has had it for about 3 years    PAST SURGICAL HISTORY: Past Surgical History:  Procedure Laterality Date   ABDOMINAL HYSTERECTOMY     CATARACT EXTRACTION W/ INTRAOCULAR LENS IMPLANT Bilateral 2007   eyes done within 1 month of each other.   IR IMAGING GUIDED PORT INSERTION  05/19/2023   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   TOTAL HIP ARTHROPLASTY Right 09/06/2019   Procedure: RIGHT TOTAL HIP ARTHROPLASTY ANTERIOR APPROACH;  Surgeon: Arnie Lao, MD;  Location: [facility] OR;  Service: Orthopedics;  Laterality: Right;   TOTAL HIP ARTHROPLASTY Left 07/31/2020   Procedure: LEFT TOTAL HIP ARTHROPLASTY ANTERIOR APPROACH;  Surgeon: Arnie Lao, MD;  Location: [facility] OR;  Service: Orthopedics;  Laterality: Left;   VIDEO BRONCHOSCOPY WITH ENDOBRONCHIAL ULTRASOUND N/A 10/06/2022   Procedure: VIDEO BRONCHOSCOPY WITH ENDOBRONCHIAL ULTRASOUND;  Surgeon: Erskin Hearing, MD;  Location: [facility] ORS;  Service: Thoracic;  Laterality: N/A;    FAMILY HISTORY: Family History  Problem Relation Age of Onset   Breast cancer Sister 63    ADVANCED DIRECTIVES (Y/N):  N  HEALTH MAINTENANCE: Social History   Tobacco Use   Smoking status: Former    Current packs/day: 0.00    Types: Cigarettes    Quit date: 01/02/1993    Years since quitting: 30.6   Smokeless tobacco: Never  Vaping  Use   Vaping status: Never Used  Substance Use Topics   Alcohol use: Yes    Alcohol/week: 4.0 standard drinks of alcohol    Types: 4 Glasses of wine per week    Comment: 09/02/2019: per patient "ever so often"   Drug use: No     Colonoscopy:  PAP:  Bone density:  Lipid panel:  Allergies  Allergen Reactions    Ezetimibe Other (See Comments)    Body aches, and stiffness   Codeine Diarrhea   Cortisone Swelling    injections   Lovastatin Other (See Comments)    Muscle cramps    Current Outpatient Medications  Medication Sig Dispense Refill   ALPRAZolam (XANAX) 0.25 MG tablet Take 0.25 mg by mouth at bedtime as needed.     aspirin  EC 81 MG tablet Take 162 mg by mouth daily. Swallow whole.     cholecalciferol  (VITAMIN D3) 25 MCG (1000 UNIT) tablet Take 1,000 Units by mouth daily.     FIBER PO Take 1 tablet by mouth daily.     furosemide  (LASIX ) 40 MG tablet Take 40 mg by mouth daily.     ibuprofen (ADVIL) 200 MG tablet Take 400 mg by mouth every 6 (six) hours as needed for mild pain or moderate pain.     Ibuprofen-diphenhydrAMINE  HCl (IBUPROFEN PM) 200-25 MG CAPS Take 2 tablets by mouth at bedtime as needed (sleep).     losartan  (COZAAR ) 100 MG tablet Take 100 mg by mouth daily.     magnesium oxide (MAG-OX) 400 (240 Mg) MG tablet Take 400 mg by mouth daily.     metoprolol  succinate (TOPROL -XL) 50 MG 24 hr tablet Take 50 mg by mouth daily.     omeprazole (PRILOSEC) 40 MG capsule Take 40 mg by mouth daily as needed.     OVER THE COUNTER MEDICATION Take 1 capsule by mouth daily. CBD gummie     prochlorperazine  (COMPAZINE ) 10 MG tablet Take 1 tablet (10 mg total) by mouth every 6 (six) hours as needed for nausea or vomiting. 30 tablet 2   Tetrahydrozoline HCl (VISINE OP) Place 1 drop into both eyes daily as needed (dry eyes).     vitamin B-12 (CYANOCOBALAMIN ) 1000 MCG tablet Take 1,000 mcg by mouth daily.     adagrasib  (KRAZATI ) 200 MG tablet Take 3 tablets (600 mg total) by mouth 2 (two) times daily. (Patient not taking: Reported on 09/03/2023) 180 tablet 0   No current facility-administered medications for this visit.    OBJECTIVE: Vitals:   09/03/23 0842  BP: 136/62  Pulse: 88  Resp: 18  Temp: 98.3 F (36.8 C)  SpO2: 97%      Body mass index is 34.72 kg/m.    ECOG FS:0 -  Asymptomatic  General: Well-developed, well-nourished, no acute distress. Eyes: Pink conjunctiva, anicteric sclera. HEENT: Normocephalic, moist mucous membranes. Lungs: No audible wheezing or coughing. Heart: Regular rate and rhythm. Abdomen: Soft, nontender, no obvious distention. Musculoskeletal: No edema, cyanosis, or clubbing. Neuro: Alert, answering all questions appropriately. Cranial nerves grossly intact. Skin: No rashes or petechiae noted. Psych: Normal affect.  LAB RESULTS:  Lab Results  Component Value Date   NA 134 (L) 09/03/2023   K 3.8 09/03/2023   CL 101 09/03/2023   CO2 24 09/03/2023   GLUCOSE 147 (H) 09/03/2023   BUN 27 (H) 09/03/2023   CREATININE 1.05 (H) 09/03/2023   CALCIUM 8.9 09/03/2023   PROT 7.4 09/03/2023   ALBUMIN  3.6 09/03/2023   AST 30  09/03/2023   ALT 23 09/03/2023   ALKPHOS 109 09/03/2023   BILITOT 0.6 09/03/2023   GFRNONAA 54 (L) 09/03/2023   GFRAA >60 09/10/2019    Lab Results  Component Value Date   WBC 9.1 09/03/2023   NEUTROABS 5.8 09/03/2023   HGB 12.2 09/03/2023   HCT 39.6 09/03/2023   MCV 78.4 (L) 09/03/2023   PLT 426 (H) 09/03/2023     STUDIES: MR Brain W Wo Contrast Result Date: 08/22/2023 CLINICAL DATA:  Lung cancer.  Abnormal findings 3 weeks ago. EXAM: MRI HEAD WITHOUT AND WITH CONTRAST TECHNIQUE: Multiplanar, multiecho pulse sequences of the brain and surrounding structures were obtained without and with intravenous contrast. CONTRAST:  8 cc Vueway  COMPARISON:  Head CT 10/14/2022 FINDINGS: Brain: Diffusion imaging does not show any acute or subacute infarction or other cause of restricted diffusion. No focal abnormality affects the brainstem. There is an enhancing mass of the lateral cerebellum on the left measuring up to 10 mm in diameter consistent with a metastasis. Minimal surrounding edema. Cerebral hemispheres show moderate chronic small-vessel ischemic changes of the white matter. No large vessel territory stroke. No  evidence of supra tentorial metastatic lesion. Vascular: Major vessels at the base of the brain show flow. Skull and upper cervical spine: Negative Sinuses/Orbits: Clear/normal Other: None IMPRESSION: 1. 10 mm enhancing mass of the lateral cerebellum on the left consistent with a metastasis. Minimal surrounding edema. No evidence of supra tentorial metastatic lesion. 2. Moderate chronic small-vessel ischemic changes of the cerebral hemispheric white matter. Electronically Signed   By: Bettylou Brunner M.D.   On: 08/22/2023 14:49   US  Venous Img Lower Bilateral Result Date: 08/12/2023 CLINICAL DATA:  Leg swelling.  Prior history of DVT. EXAM: Bilateral lower Extremity Venous Doppler Ultrasound TECHNIQUE: Gray-scale sonography with compression, as well as color and duplex ultrasound, were performed to evaluate the deep venous system(s) from the level of the common femoral vein through the popliteal and proximal calf veins. COMPARISON:  None available FINDINGS: VENOUS Normal compressibility of the common femoral, superficial femoral, and popliteal veins, as well as the visualized calf veins. Visualized portions of profunda femoral vein and great saphenous vein unremarkable. No filling defects to suggest DVT on grayscale or color Doppler imaging. Doppler waveforms show normal direction of venous flow, normal respiratory plasticity and response to augmentation. OTHER None. Limitations: none IMPRESSION: No  lower extremity DVT. Electronically Signed   By: Elester Grim M.D.   On: 08/12/2023 16:36   ONCOLOGY HISTORY: Patient completed SBRT to the 2.7 cm irregular left lower lobe nodule in July 2024.  PET scan results from February 13, 2023 reviewed independently with residual low level of activity suggesting treatment response in the left lower lobe.  The AP window lymph node seen on subsequent CT scan revealed progressive enlargement now measuring 1.7 x 1.3 cm as well as increased hypermetabolic activity.  Patient completed  XRT to this lesion.  Patient has also completed XRT to her left pelvis.    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  Patient initiated Keytruda  on May 07, 2023.  But given her progressive disease, will switch treatment to Adagrasib  (Krazati ) 600 mg twice daily given her K-ras mutation.  MRI of the brain on August 22, 2023 revealed a 1 cm left lateral cerebellum lesion and a referral has been  sent back to radiation oncology for treatment.  Patient's creatinine has now significantly improved and she has been instructed to reinitiate Adagrasib .  Return to clinic in 1 week for laboratory work and phone discussion with clinical pharmacy.  She will then return to clinic in 2 weeks for repeat laboratory work, further evaluation, and continuation of treatment.   Brain metastasis: Patient has an appointment with radiation oncology today. Renal insufficiency: Essentially resolved.  Reinitiate treatment as above.  If patient's creatinine begins to rise again, will consider dose reduction.   Hyponatremia: Sodium improved to 134.  Monitor. Leukocytosis: Resolved.   Thrombocytosis: Likely reactive. Shortness of breath/cough: Chronic and unchanged.  Patient does not complain of this today.    Patient expressed understanding and was in agreement with this plan. She also understands that She can call clinic at any time with any questions, concerns, or complaints.    Cancer Staging  Adenocarcinoma of left lung [facility]) Staging form: Lung, AJCC 8th Edition - Clinical stage from 03/19/2023: Stage IVB (cTX, cN2, cM1c) - Signed by Shellie Dials, MD on 03/19/2023 Stage prefix: Initial diagnosis   Shellie Dials, MD   09/03/2023 10:22 AM

## 2023-09-05 DIAGNOSIS — Z510 Encounter for antineoplastic radiation therapy: Secondary | ICD-10-CM | POA: Diagnosis not present

## 2023-09-08 ENCOUNTER — Other Ambulatory Visit: Payer: Self-pay

## 2023-09-08 ENCOUNTER — Other Ambulatory Visit: Payer: Self-pay | Admitting: *Deleted

## 2023-09-08 DIAGNOSIS — C3492 Malignant neoplasm of unspecified part of left bronchus or lung: Secondary | ICD-10-CM

## 2023-09-09 ENCOUNTER — Inpatient Hospital Stay

## 2023-09-09 ENCOUNTER — Ambulatory Visit
Admission: RE | Admit: 2023-09-09 | Discharge: 2023-09-09 | Disposition: A | Discharge status: Home / Self Care | Source: Ambulatory Visit | Attending: Radiation Oncology | Admitting: Radiation Oncology

## 2023-09-09 ENCOUNTER — Other Ambulatory Visit: Payer: Self-pay | Admitting: *Deleted

## 2023-09-09 ENCOUNTER — Other Ambulatory Visit: Payer: Self-pay

## 2023-09-09 DIAGNOSIS — Z510 Encounter for antineoplastic radiation therapy: Secondary | ICD-10-CM | POA: Diagnosis not present

## 2023-09-09 DIAGNOSIS — C3492 Malignant neoplasm of unspecified part of left bronchus or lung: Secondary | ICD-10-CM

## 2023-09-09 LAB — CBC WITH DIFFERENTIAL/PLATELET
Abs Immature Granulocytes: 0.16 10*3/uL — ABNORMAL HIGH (ref 0.00–0.07)
Basophils Absolute: 0 10*3/uL (ref 0.0–0.1)
Basophils Relative: 0 %
Eosinophils Absolute: 0 10*3/uL (ref 0.0–0.5)
Eosinophils Relative: 0 %
HCT: 38.8 % (ref 36.0–46.0)
Hemoglobin: 12 g/dL (ref 12.0–15.0)
Immature Granulocytes: 1 %
Lymphocytes Relative: 9 %
Lymphs Abs: 1.1 10*3/uL (ref 0.7–4.0)
MCH: 23.8 pg — ABNORMAL LOW (ref 26.0–34.0)
MCHC: 30.9 g/dL (ref 30.0–36.0)
MCV: 76.8 fL — ABNORMAL LOW (ref 80.0–100.0)
Monocytes Absolute: 0.3 10*3/uL (ref 0.1–1.0)
Monocytes Relative: 2 %
Neutro Abs: 10.8 10*3/uL — ABNORMAL HIGH (ref 1.7–7.7)
Neutrophils Relative %: 88 %
Platelets: 488 10*3/uL — ABNORMAL HIGH (ref 150–400)
RBC: 5.05 MIL/uL (ref 3.87–5.11)
RDW: 16.9 % — ABNORMAL HIGH (ref 11.5–15.5)
WBC: 12.4 10*3/uL — ABNORMAL HIGH (ref 4.0–10.5)
nRBC: 0 % (ref 0.0–0.2)

## 2023-09-09 LAB — RAD ONC ARIA SESSION SUMMARY
Course Elapsed Days: 0
Plan Fractions Treated to Date: 1
Plan Prescribed Dose Per Fraction: 20 Gy
Plan Total Fractions Prescribed: 1
Plan Total Prescribed Dose: 20 Gy
Reference Point Dosage Given to Date: 20 Gy
Reference Point Session Dosage Given: 20 Gy
Session Number: 1

## 2023-09-09 LAB — CMP (CANCER CENTER ONLY)
ALT: 31 U/L (ref 0–44)
AST: 34 U/L (ref 15–41)
Albumin: 3.7 g/dL (ref 3.5–5.0)
Alkaline Phosphatase: 120 U/L (ref 38–126)
Anion gap: 12 (ref 5–15)
BUN: 31 mg/dL — ABNORMAL HIGH (ref 8–23)
CO2: 20 mmol/L — ABNORMAL LOW (ref 22–32)
Calcium: 9 mg/dL (ref 8.9–10.3)
Chloride: 101 mmol/L (ref 98–111)
Creatinine: 1.28 mg/dL — ABNORMAL HIGH (ref 0.44–1.00)
GFR, Estimated: 43 mL/min — ABNORMAL LOW (ref >60)
Glucose, Bld: 210 mg/dL — ABNORMAL HIGH (ref 70–99)
Potassium: 4.4 mmol/L (ref 3.5–5.1)
Sodium: 133 mmol/L — ABNORMAL LOW (ref 135–145)
Total Bilirubin: 0.6 mg/dL (ref 0.0–1.2)
Total Protein: 7.7 g/dL (ref 6.5–8.1)

## 2023-09-09 MED ORDER — DEXAMETHASONE 2 MG PO TABS: 2.0000 mg | ORAL_TABLET | Freq: Two times a day (BID) | ORAL | 0 refills | Status: AC

## 2023-09-10 ENCOUNTER — Other Ambulatory Visit: Payer: Self-pay

## 2023-09-10 ENCOUNTER — Inpatient Hospital Stay: Admitting: Pharmacist

## 2023-09-10 ENCOUNTER — Telehealth: Payer: Self-pay | Admitting: *Deleted

## 2023-09-10 DIAGNOSIS — C3492 Malignant neoplasm of unspecified part of left bronchus or lung: Secondary | ICD-10-CM

## 2023-09-10 MED ORDER — KRAZATI 200 MG PO TABS
600.0000 mg | ORAL_TABLET | Freq: Two times a day (BID) | ORAL | 0 refills | Status: DC
  Filled 2023-09-10 – 2023-09-11 (×2): qty 180, 30d supply, fill #0

## 2023-09-10 NOTE — Progress Notes (Signed)
 Clinical Pharmacist Practitioner- Virtual Visit [facility]  [phone] [phone]  Patient Care Team: Eartha Gold, MD as PCP - General (Infectious Diseases) Drake Gens, RN as Oncology Nurse Navigator Glenis Langdon, MD as Consulting Physician (Radiation Oncology) Shellie Dials, MD as Consulting Physician (Oncology)   Name of the patient: Hannah Yu  [id number]  October 15, 1945   Date of visit: 09/10/23  Virtual visit: I connected with  Gorge Laud on 09/10/2023 at 11:30 AM EDT by a video enabled telemedicine application and verified that I am speaking with the correct person using two identifiers. I discussed the limitations of evaluation and management by telemedicine and the availability of in person appointments. The patient expressed understanding and agreed to proceed. Locations: Patient: home , Provider: in office  HPI: Patient is a 78 y.o. female with progressive NSCLC, KRAS G12C mutation positive ([place] 03/3023). Patient started adagrasib  on 08/20/23.   Reason for Consult: Oral chemotherapy follow-up for adagrasib   therapy.   PAST MEDICAL HISTORY: Past Medical History:  Diagnosis Date   Acid reflux    Anemia    Anesthesia complication    Tachycardia previously, now on metoprolol    Arthritis    09/02/2019: per patient "have it real bad in both hands and back"   Difficult intubation    had to terminate intubation unable to advance tube for cholecystectomy 2017?   DVT (deep venous thrombosis) (HCC)    leg   Hypertension    Sciatica    09/02/2019: has had it for about 3 years    HEMATOLOGY/ONCOLOGY HISTORY:  Oncology History  Adenocarcinoma of left lung (HCC)  03/19/2023 Initial Diagnosis   Adenocarcinoma of left lung (HCC)   03/19/2023 Cancer Staging   Staging form: Lung, AJCC 8th Edition - Clinical stage from 03/19/2023: Stage IVB (cTX, cN2, cM1c) - Signed by Shellie Dials, MD on 03/19/2023 Stage  prefix: Initial diagnosis   05/07/2023 - 07/16/2023 Chemotherapy   Patient is on Treatment Plan : LUNG NSCLC Pembrolizumab  (200) q21d       ALLERGIES:  is allergic to ezetimibe, codeine, cortisone, and lovastatin.  MEDICATIONS:  Current Outpatient Medications  Medication Sig Dispense Refill   adagrasib  (KRAZATI ) 200 MG tablet Take 3 tablets (600 mg total) by mouth 2 (two) times daily. (Patient not taking: Reported on 09/03/2023) 180 tablet 0   ALPRAZolam (XANAX) 0.25 MG tablet Take 0.25 mg by mouth at bedtime as needed.     aspirin  EC 81 MG tablet Take 162 mg by mouth daily. Swallow whole.     cholecalciferol  (VITAMIN D3) 25 MCG (1000 UNIT) tablet Take 1,000 Units by mouth daily.     dexamethasone  (DECADRON ) 2 MG tablet Take 1 tablet (2 mg total) by mouth 2 (two) times daily with a meal. Per MD instructions 11 tablet 0   dexamethasone  (DECADRON ) 2 MG tablet Take 1 tablet (2 mg total) by mouth 2 (two) times daily with a meal for 1 day. 2 tablet 0   FIBER PO Take 1 tablet by mouth daily.     furosemide  (LASIX ) 40 MG tablet Take 40 mg by mouth daily.     ibuprofen (ADVIL) 200 MG tablet Take 400 mg by mouth every 6 (six) hours as needed for mild pain or moderate pain.     Ibuprofen-diphenhydrAMINE  HCl (IBUPROFEN PM) 200-25 MG CAPS Take 2 tablets by mouth at bedtime as needed (sleep).     losartan  (COZAAR ) 100 MG tablet Take 100 mg by mouth daily.  magnesium oxide (MAG-OX) 400 (240 Mg) MG tablet Take 400 mg by mouth daily.     metoprolol  succinate (TOPROL -XL) 50 MG 24 hr tablet Take 50 mg by mouth daily.     omeprazole (PRILOSEC) 40 MG capsule Take 40 mg by mouth daily as needed.     OVER THE COUNTER MEDICATION Take 1 capsule by mouth daily. CBD gummie     prochlorperazine  (COMPAZINE ) 10 MG tablet Take 1 tablet (10 mg total) by mouth every 6 (six) hours as needed for nausea or vomiting. 30 tablet 2   Tetrahydrozoline HCl (VISINE OP) Place 1 drop into both eyes daily as needed (dry eyes).      vitamin B-12 (CYANOCOBALAMIN ) 1000 MCG tablet Take 1,000 mcg by mouth daily.     No current facility-administered medications for this visit.    VITAL SIGNS: There were no vitals taken for this visit. There were no vitals filed for this visit.  Estimated body mass index is 34.72 kg/m as calculated from the following:   Height as of 09/03/23: 5' (1.524 m).   Weight as of 09/03/23: 80.6 kg (177 lb 12.8 oz).  LABS: CBC:    Component Value Date/Time   WBC 12.4 (H) 09/09/2023 0816   HGB 12.0 09/09/2023 0816   HGB 10.9 (L) 07/16/2023 0936   HCT 38.8 09/09/2023 0816   PLT 488 (H) 09/09/2023 0816   PLT 503 (H) 07/16/2023 0936   MCV 76.8 (L) 09/09/2023 0816   NEUTROABS 10.8 (H) 09/09/2023 0816   LYMPHSABS 1.1 09/09/2023 0816   MONOABS 0.3 09/09/2023 0816   EOSABS 0.0 09/09/2023 0816   BASOSABS 0.0 09/09/2023 0816   Comprehensive Metabolic Panel:    Component Value Date/Time   NA 133 (L) 09/09/2023 0816   K 4.4 09/09/2023 0816   CL 101 09/09/2023 0816   CO2 20 (L) 09/09/2023 0816   BUN 31 (H) 09/09/2023 0816   CREATININE 1.28 (H) 09/09/2023 0816   GLUCOSE 210 (H) 09/09/2023 0816   CALCIUM 9.0 09/09/2023 0816   AST 34 09/09/2023 0816   ALT 31 09/09/2023 0816   ALKPHOS 120 09/09/2023 0816   BILITOT 0.6 09/09/2023 0816   PROT 7.7 09/09/2023 0816   ALBUMIN  3.7 09/09/2023 0816    Present during today's visit: patient only  Assessment and Plan-  CMP/CBC reviewed with patient: SCr from 09/09/23 and historic SCr results reviewed with patient. She is okay to continue adagrasib  600mg  twice daily. Will continue to monitor closely. She will have repeat labs next week with f/u MD visit Overall patient is feeling good. Of note, she has radiation yesterday and reports no issues.    Oral Chemotherapy Side Effect/Intolerance:  None reported  Oral Chemotherapy Adherence: No missed dose reported No patient barriers to medication adherence identified.   New medications: dexamethasone  from  radiation oncology, this is already updated in her medication list  Medication Access Issues: No issues, refill rx sent to [facility] (Specialty) today  Patient expressed understanding and was in agreement with this plan. She also understands that She can call clinic at any time with any questions, concerns, or complaints.   Follow-up plan: RTC next week as scheduled  Thank you for allowing me to participate in the care of this very pleasant patient.   Time Total: 8 mins  Visit consisted of counseling and education on dealing with issues of symptom management in the setting of serious and potentially life-threatening illness.Greater than 50%  of this time was spent counseling and coordinating  care related to the above assessment and plan.  Signed by: Dayanne Yiu N. Milli Woolridge, PharmD, Lorraine Roses, CPP Hematology/Oncology Clinical Pharmacist [facility] Oral Chemotherapy [facility] [phone]  09/10/2023 12:33 PM

## 2023-09-10 NOTE — Telephone Encounter (Signed)
 Patient said that she has gotten radiation and she wanted to know if there is any problems with washing hair or getting cut or anything about the hair that she might need to know

## 2023-09-10 NOTE — Telephone Encounter (Signed)
 Returned patient call advised her that there were no restrictions for hair care.

## 2023-09-10 NOTE — Radiation Completion Notes (Signed)
 Patient Name: Hannah Yu, Hannah Yu MRN: [id number] Date of Birth: [birth year] Referring Physician: Emi Hanson, M.D. Date of Service: 2023-09-10 Radiation Oncologist: Glenis Langdon, M.D. [facility] [place]                             RADIATION ONCOLOGY END OF TREATMENT NOTE     Diagnosis: C79.31 Secondary malignant neoplasm of brain Staging on 2023-03-19: Adenocarcinoma of left lung (HCC) T=cTX, N=cN2, M=cM1c Intent: Curative     HPI: Patient is a 78 year old female well-known to our department having received palliative radiation therapy to her pelvis as well as multiple lung lesions treated with SBRT for stage IVb adenocarcinoma of the lung.  She has been on Keytruda .  Although recent MRI of the brain was performed showing a 1 cm enhancing mass in the lateral cerebellum on the left consistent with metastatic disease with minimal surrounding edema.  No other evidence of supratentorial metastatic lesions noted.  She is fairly asymptomatic occasionally does have a headache no change in visual fields no focal neurologic deficits noted trouble with balance.  Air other areas of prior treatment including metastatic disease to her pelvis she seems to have benefited significantly with very little pain at this time.  She is seen today for radiation oncology evaluation for [facility]      ==========DELIVERED PLANS==========  First Treatment Date: 2023-09-09 Last Treatment Date: 2023-09-09   Plan Name: Brain_SRS Site: Brain Technique: SBRT/SRT-IMRT Mode: Photon Dose Per Fraction: 20 Gy Prescribed Dose (Delivered / Prescribed): 20 Gy / 20 Gy Prescribed Fxs (Delivered / Prescribed): 1 / 1     ==========ON TREATMENT VISIT DATES========== 2023-09-09, 2023-09-09     ==========UPCOMING VISITS========== 10/08/2023 [facility] RAD ONCOLOGY FOLLOW UP 30 Glenis Langdon, MD  09/17/2023 [facility] MED ONC EST PT 15 Shellie Dials, MD  09/17/2023 [facility] MED ONC LAB [facility]  09/10/2023 [facility] MED ONC MYCHART VIDEO VISIT Thaddeus Filippo, RPH-CPP        ==========APPENDIX - ON TREATMENT VISIT NOTES==========   See weekly On Treatment Notes in Epic for details in the Media tab (listed as Progress notes on the On Treatment Visit Dates listed above).

## 2023-09-11 ENCOUNTER — Other Ambulatory Visit: Payer: Self-pay

## 2023-09-11 ENCOUNTER — Other Ambulatory Visit (HOSPITAL_COMMUNITY): Payer: Self-pay

## 2023-09-11 NOTE — Progress Notes (Signed)
 Specialty Pharmacy Refill Coordination Note  Hannah Yu is a 78 y.o. female contacted today regarding refills of specialty medication(s) Adagrasib  (Krazati )   Patient requested [name] [place] date: 09/16/23   Medication will be filled on 09/15/23.       Aaron Aas

## 2023-09-15 ENCOUNTER — Ambulatory Visit: Attending: Internal Medicine

## 2023-09-15 DIAGNOSIS — J449 Chronic obstructive pulmonary disease, unspecified: Secondary | ICD-10-CM | POA: Insufficient documentation

## 2023-09-15 DIAGNOSIS — E669 Obesity, unspecified: Secondary | ICD-10-CM | POA: Diagnosis not present

## 2023-09-15 DIAGNOSIS — I10 Essential (primary) hypertension: Secondary | ICD-10-CM | POA: Diagnosis not present

## 2023-09-15 DIAGNOSIS — G4710 Hypersomnia, unspecified: Secondary | ICD-10-CM | POA: Diagnosis present

## 2023-09-15 DIAGNOSIS — Z9981 Dependence on supplemental oxygen: Secondary | ICD-10-CM | POA: Diagnosis not present

## 2023-09-15 DIAGNOSIS — Z6834 Body mass index (BMI) 34.0-34.9, adult: Secondary | ICD-10-CM | POA: Insufficient documentation

## 2023-09-15 DIAGNOSIS — G4733 Obstructive sleep apnea (adult) (pediatric): Secondary | ICD-10-CM | POA: Diagnosis not present

## 2023-09-16 ENCOUNTER — Other Ambulatory Visit: Payer: Self-pay | Admitting: *Deleted

## 2023-09-16 DIAGNOSIS — C3492 Malignant neoplasm of unspecified part of left bronchus or lung: Secondary | ICD-10-CM

## 2023-09-17 ENCOUNTER — Inpatient Hospital Stay (HOSPITAL_BASED_OUTPATIENT_CLINIC_OR_DEPARTMENT_OTHER): Admitting: Oncology

## 2023-09-17 ENCOUNTER — Inpatient Hospital Stay

## 2023-09-17 ENCOUNTER — Encounter: Payer: Self-pay | Admitting: Oncology

## 2023-09-17 ENCOUNTER — Telehealth: Payer: Self-pay | Admitting: *Deleted

## 2023-09-17 VITALS — BP 123/66 | HR 59 | Temp 98.2°F | Resp 16 | Wt 178.0 lb

## 2023-09-17 DIAGNOSIS — C3492 Malignant neoplasm of unspecified part of left bronchus or lung: Secondary | ICD-10-CM

## 2023-09-17 DIAGNOSIS — Z510 Encounter for antineoplastic radiation therapy: Secondary | ICD-10-CM | POA: Diagnosis not present

## 2023-09-17 LAB — CBC WITH DIFFERENTIAL/PLATELET
Abs Immature Granulocytes: 0.51 10*3/uL — ABNORMAL HIGH (ref 0.00–0.07)
Basophils Absolute: 0.1 10*3/uL (ref 0.0–0.1)
Basophils Relative: 0 %
Eosinophils Absolute: 0 10*3/uL (ref 0.0–0.5)
Eosinophils Relative: 0 %
HCT: 42.7 % (ref 36.0–46.0)
Hemoglobin: 13.4 g/dL (ref 12.0–15.0)
Immature Granulocytes: 3 %
Lymphocytes Relative: 9 %
Lymphs Abs: 1.5 10*3/uL (ref 0.7–4.0)
MCH: 23.9 pg — ABNORMAL LOW (ref 26.0–34.0)
MCHC: 31.4 g/dL (ref 30.0–36.0)
MCV: 76.1 fL — ABNORMAL LOW (ref 80.0–100.0)
Monocytes Absolute: 2.2 10*3/uL — ABNORMAL HIGH (ref 0.1–1.0)
Monocytes Relative: 14 %
Neutro Abs: 11.8 10*3/uL — ABNORMAL HIGH (ref 1.7–7.7)
Neutrophils Relative %: 74 %
Platelets: 397 10*3/uL (ref 150–400)
RBC: 5.61 MIL/uL — ABNORMAL HIGH (ref 3.87–5.11)
RDW: 17.8 % — ABNORMAL HIGH (ref 11.5–15.5)
WBC: 16.1 10*3/uL — ABNORMAL HIGH (ref 4.0–10.5)
nRBC: 0 % (ref 0.0–0.2)

## 2023-09-17 LAB — CMP (CANCER CENTER ONLY)
ALT: 38 U/L (ref 0–44)
AST: 25 U/L (ref 15–41)
Albumin: 3.6 g/dL (ref 3.5–5.0)
Alkaline Phosphatase: 101 U/L (ref 38–126)
Anion gap: 10 (ref 5–15)
BUN: 45 mg/dL — ABNORMAL HIGH (ref 8–23)
CO2: 26 mmol/L (ref 22–32)
Calcium: 8.6 mg/dL — ABNORMAL LOW (ref 8.9–10.3)
Chloride: 96 mmol/L — ABNORMAL LOW (ref 98–111)
Creatinine: 1.15 mg/dL — ABNORMAL HIGH (ref 0.44–1.00)
GFR, Estimated: 48 mL/min — ABNORMAL LOW (ref >60)
Glucose, Bld: 78 mg/dL (ref 70–99)
Potassium: 4.4 mmol/L (ref 3.5–5.1)
Sodium: 132 mmol/L — ABNORMAL LOW (ref 135–145)
Total Bilirubin: 0.7 mg/dL (ref 0.0–1.2)
Total Protein: 7 g/dL (ref 6.5–8.1)

## 2023-09-17 NOTE — Telephone Encounter (Signed)
 I called her back and let her know that it is  6/5 1:30 she is ok.

## 2023-09-17 NOTE — Progress Notes (Signed)
 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  INTERVAL HISTORY: Patient returns to clinic today for repeat laboratory work, further evaluation, and continuation of Adagrasib .  She is tolerating treatment well without significant side effects.  She currently feels well and is asymptomatic.  She has no neurologic complaints.  She denies any recent fevers.  She has a good appetite and denies weight loss.  She denies any chest pain, shortness of breath, cough, or hemoptysis.  She denies any nausea, vomiting, constipation, or diarrhea.  She has no urinary complaints.  Patient offers no specific complaints today.  REVIEW OF SYSTEMS:   Review of Systems  Constitutional: Negative.  Negative for fever, malaise/fatigue and weight loss.  Respiratory: Negative.  Negative for cough, hemoptysis and shortness of breath.   Cardiovascular: Negative.  Negative for chest pain and leg swelling.  Gastrointestinal: Negative.  Negative for abdominal pain.  Genitourinary: Negative.  Negative for dysuria.  Musculoskeletal: Negative.  Negative for back pain and joint pain.  Skin: Negative.  Negative for rash.  Neurological: Negative.  Negative for dizziness, focal weakness, weakness and headaches.  Psychiatric/Behavioral: Negative.  The patient is not nervous/anxious.     As per HPI. Otherwise, a complete review of systems is negative.  PAST MEDICAL HISTORY: Past Medical History:  Diagnosis Date   Acid reflux    Anemia    Anesthesia complication     Tachycardia previously, now on metoprolol    Arthritis    09/02/2019: per patient "have it real bad in both hands and back"   Difficult intubation    had to terminate intubation unable to advance tube for cholecystectomy 2017?   DVT (deep venous thrombosis) (HCC)    leg   Hypertension    Sciatica    09/02/2019: has had it for about 3 years    PAST SURGICAL HISTORY: Past Surgical History:  Procedure Laterality Date   ABDOMINAL HYSTERECTOMY     CATARACT EXTRACTION W/ INTRAOCULAR LENS IMPLANT Bilateral 2007   eyes done within 1 month of each other.   IR IMAGING GUIDED PORT INSERTION  05/19/2023   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   TOTAL HIP ARTHROPLASTY Right 09/06/2019   Procedure: RIGHT TOTAL HIP ARTHROPLASTY ANTERIOR APPROACH;  Surgeon: Arnie Lao, MD;  Location: [facility] OR;  Service: Orthopedics;  Laterality: Right;   TOTAL HIP ARTHROPLASTY Left 07/31/2020   Procedure: LEFT TOTAL HIP ARTHROPLASTY ANTERIOR APPROACH;  Surgeon: Arnie Lao, MD;  Location: [facility] OR;  Service: Orthopedics;  Laterality: Left;   VIDEO BRONCHOSCOPY WITH ENDOBRONCHIAL ULTRASOUND N/A 10/06/2022   Procedure: VIDEO BRONCHOSCOPY WITH ENDOBRONCHIAL ULTRASOUND;  Surgeon: Erskin Hearing, MD;  Location: [facility] ORS;  Service: Thoracic;  Laterality: N/A;    FAMILY HISTORY: Family History  Problem Relation Age of Onset   Breast cancer Sister 69    ADVANCED DIRECTIVES (Y/N):  N  HEALTH MAINTENANCE: Social History   Tobacco Use   Smoking status: Former    Current packs/day: 0.00    Types: Cigarettes    Quit date: 01/02/1993  Years since quitting: 30.7   Smokeless tobacco: Never  Vaping Use   Vaping status: Never Used  Substance Use Topics   Alcohol use: Yes    Alcohol/week: 4.0 standard drinks of alcohol    Types: 4 Glasses of wine per week    Comment: 09/02/2019: per patient "ever so often"   Drug use: No      Colonoscopy:  PAP:  Bone density:  Lipid panel:  Allergies  Allergen Reactions   Ezetimibe Other (See Comments)    Body aches, and stiffness   Codeine Diarrhea   Cortisone Swelling    injections   Lovastatin Other (See Comments)    Muscle cramps    Current Outpatient Medications  Medication Sig Dispense Refill   adagrasib  (KRAZATI ) 200 MG tablet Take 3 tablets (600 mg total) by mouth 2 (two) times daily. 180 tablet 0   aspirin  EC 81 MG tablet Take 162 mg by mouth daily. Swallow whole.     Biotin 10 MG CAPS      cholecalciferol  (VITAMIN D3) 25 MCG (1000 UNIT) tablet Take 1,000 Units by mouth daily.     FIBER PO Take 1 tablet by mouth daily.     furosemide  (LASIX ) 40 MG tablet Take 40 mg by mouth daily. 40 mg alternating 80 mg every other day     ibuprofen (ADVIL) 200 MG tablet Take 400 mg by mouth every 6 (six) hours as needed for mild pain or moderate pain.     Ibuprofen-diphenhydrAMINE  HCl (IBUPROFEN PM) 200-25 MG CAPS Take 2 tablets by mouth at bedtime as needed (sleep).     LORazepam (ATIVAN) 0.5 MG tablet Take by mouth.     losartan  (COZAAR ) 100 MG tablet Take 100 mg by mouth daily.     magnesium oxide (MAG-OX) 400 (240 Mg) MG tablet Take 400 mg by mouth daily.     metoprolol  succinate (TOPROL -XL) 50 MG 24 hr tablet Take 50 mg by mouth daily.     omeprazole (PRILOSEC) 40 MG capsule Take 40 mg by mouth daily as needed.     OVER THE COUNTER MEDICATION Take 1 capsule by mouth daily. CBD gummie     Tetrahydrozoline HCl (VISINE OP) Place 1 drop into both eyes daily as needed (dry eyes).     vitamin B-12 (CYANOCOBALAMIN ) 1000 MCG tablet Take 1,000 mcg by mouth daily.     dexamethasone  (DECADRON ) 2 MG tablet Take 1 tablet (2 mg total) by mouth 2 (two) times daily with a meal. Per MD instructions (Patient not taking: Reported on 09/17/2023) 11 tablet 0   prochlorperazine  (COMPAZINE ) 10 MG tablet Take 1 tablet (10 mg total) by mouth every 6 (six) hours as needed for nausea or  vomiting. (Patient not taking: Reported on 09/17/2023) 30 tablet 2   No current facility-administered medications for this visit.    OBJECTIVE: Vitals:   09/17/23 1027  BP: 123/66  Pulse: (!) 59  Resp: 16  Temp: 98.2 F (36.8 C)  SpO2: 97%      Body mass index is 34.76 kg/m.    ECOG FS:0 - Asymptomatic  General: Well-developed, well-nourished, no acute distress. Eyes: Pink conjunctiva, anicteric sclera. HEENT: Normocephalic, moist mucous membranes. Lungs: No audible wheezing or coughing. Heart: Regular rate and rhythm. Abdomen: Soft, nontender, no obvious distention. Musculoskeletal: No edema, cyanosis, or clubbing. Neuro: Alert, answering all questions appropriately. Cranial nerves grossly intact. Skin: No rashes or petechiae noted. Psych: Normal affect.  LAB RESULTS:  Lab Results  Component Value Date  NA 132 (L) 09/17/2023   K 4.4 09/17/2023   CL 96 (L) 09/17/2023   CO2 26 09/17/2023   GLUCOSE 78 09/17/2023   BUN 45 (H) 09/17/2023   CREATININE 1.15 (H) 09/17/2023   CALCIUM 8.6 (L) 09/17/2023   PROT 7.0 09/17/2023   ALBUMIN  3.6 09/17/2023   AST 25 09/17/2023   ALT 38 09/17/2023   ALKPHOS 101 09/17/2023   BILITOT 0.7 09/17/2023   GFRNONAA 48 (L) 09/17/2023   GFRAA >60 09/10/2019    Lab Results  Component Value Date   WBC 16.1 (H) 09/17/2023   NEUTROABS 11.8 (H) 09/17/2023   HGB 13.4 09/17/2023   HCT 42.7 09/17/2023   MCV 76.1 (L) 09/17/2023   PLT 397 09/17/2023     STUDIES: MR Brain W Wo Contrast Result Date: 09/08/2023 CLINICAL DATA:  Brain metastasis, assess treatment response. EXAM: MRI HEAD WITHOUT AND WITH CONTRAST TECHNIQUE: Multiplanar, multiecho pulse sequences of the brain and surrounding structures were obtained without and with intravenous contrast. CONTRAST:  8mL GADAVIST  GADOBUTROL  1 MMOL/ML IV SOLN COMPARISON:  08/22/2023 and earlier. FINDINGS: Brain: Redemonstrated enhancing lesion in the lateral aspect of the left cerebellum which  measures approximately 12 x 9 x 9 mm on the current study, previously measuring 11 x 9 x 8 mm. Minimal surrounding edema within the left cerebellum. No acute infarct. No evidence of intracranial hemorrhage. Scattered T2/FLAIR signal abnormality in the periventricular and subcortical white matter likely reflecting chronic microvascular ischemic changes. No midline shift. Normal appearance of midline structures. The basilar cisterns are patent. No extra-axial fluid collections. Ventricles: Prominence of the lateral ventricles suggestive of underlying parenchymal volume loss. Vascular: Skull base flow voids are visualized. Skull and upper cervical spine: No focal abnormality. Sinuses/Orbits: Orbits are symmetric. Paranasal sinuses are clear. Other: Small right mastoid effusion again noted. IMPRESSION: 12 mm enhancing lesion in the lateral aspect of the left cerebellum, similar to 08/22/2023 when remeasured in a similar manner. No new intracranial lesions noted. Electronically Signed   By: Denny Flack M.D.   On: 09/08/2023 14:58   MR Brain W Wo Contrast Result Date: 08/22/2023 CLINICAL DATA:  Lung cancer.  Abnormal findings 3 weeks ago. EXAM: MRI HEAD WITHOUT AND WITH CONTRAST TECHNIQUE: Multiplanar, multiecho pulse sequences of the brain and surrounding structures were obtained without and with intravenous contrast. CONTRAST:  8 cc [employer]  COMPARISON:  Head CT 10/14/2022 FINDINGS: Brain: Diffusion imaging does not show any acute or subacute infarction or other cause of restricted diffusion. No focal abnormality affects the brainstem. There is an enhancing mass of the lateral cerebellum on the left measuring up to 10 mm in diameter consistent with a metastasis. Minimal surrounding edema. Cerebral hemispheres show moderate chronic small-vessel ischemic changes of the white matter. No large vessel territory stroke. No evidence of supra tentorial metastatic lesion. Vascular: Major vessels at the base of the brain show  flow. Skull and upper cervical spine: Negative Sinuses/Orbits: Clear/normal Other: None IMPRESSION: 1. 10 mm enhancing mass of the lateral cerebellum on the left consistent with a metastasis. Minimal surrounding edema. No evidence of supra tentorial metastatic lesion. 2. Moderate chronic small-vessel ischemic changes of the cerebral hemispheric white matter. Electronically Signed   By: Bettylou Brunner M.D.   On: 08/22/2023 14:49   ONCOLOGY HISTORY: Patient completed SBRT to the 2.7 cm irregular left lower lobe nodule in July 2024.  PET scan results from February 13, 2023 reviewed independently with residual low level of activity suggesting treatment response in the left lower  lobe.  The AP window lymph node seen on subsequent CT scan revealed progressive enlargement now measuring 1.7 x 1.3 cm as well as increased hypermetabolic activity.  Patient completed XRT to this lesion.  Patient has also completed XRT to her left pelvis.    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  Patient initiated Keytruda  on May 07, 2023.  PET scan on July 08, 2023 revealed progressive disease and treatment was changed to Adagrasib  (Krazati ) 600 mg twice daily given her K-ras mutation.  MRI of the brain on August 22, 2023 revealed a 1 cm left lateral cerebellum lesion and a referral has been sent back to radiation oncology for treatment.  Continue adagrasib  as prescribed.  Return to clinic in 2 weeks for laboratory work and evaluation by clinical pharmacy.  Will get a PET scan in 3 weeks.  Patient will then return to clinic in 4 weeks for laboratory work, discussion of her imaging results, and continuation of treatment. 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 Renal insufficiency: Creatinine continues to improve and  is now 1.15.  Continue treatment as above. Hyponatremia: Chronic and unchanged.  Patient sodium levels 132. Leukocytosis: Likely reactive, monitor. Thrombocytosis: Resolved. Shortness of breath/cough: Chronic and unchanged.  Patient does not complain of this today.    Patient expressed understanding and was in agreement with this plan. She also understands that She can call clinic at any time with any questions, concerns, or complaints.    Cancer Staging  Adenocarcinoma of left lung [facility]) Staging form: Lung, AJCC 8th Edition - Clinical stage from 03/19/2023: Stage IVB (cTX, cN2, cM1c) - Signed by Shellie Dials, MD on 03/19/2023 Stage prefix: Initial diagnosis   Shellie Dials, MD   09/17/2023 11:15 AM

## 2023-09-17 NOTE — Progress Notes (Signed)
 Pt in for follow up, reports having some occasional leg pain otherwise no concerns.

## 2023-09-24 ENCOUNTER — Other Ambulatory Visit: Payer: Self-pay | Admitting: Pharmacist

## 2023-09-24 DIAGNOSIS — C3492 Malignant neoplasm of unspecified part of left bronchus or lung: Secondary | ICD-10-CM

## 2023-09-30 ENCOUNTER — Ambulatory Visit: Payer: Medicare Other | Admitting: Radiation Oncology

## 2023-10-01 ENCOUNTER — Inpatient Hospital Stay

## 2023-10-01 ENCOUNTER — Inpatient Hospital Stay: Admitting: Pharmacist

## 2023-10-01 VITALS — BP 137/50 | HR 88 | Temp 99.9°F | Resp 18 | Wt 181.3 lb

## 2023-10-01 DIAGNOSIS — Z510 Encounter for antineoplastic radiation therapy: Secondary | ICD-10-CM | POA: Diagnosis not present

## 2023-10-01 DIAGNOSIS — C3492 Malignant neoplasm of unspecified part of left bronchus or lung: Secondary | ICD-10-CM

## 2023-10-01 LAB — CBC WITH DIFFERENTIAL (CANCER CENTER ONLY)
Abs Immature Granulocytes: 0.23 10*3/uL — ABNORMAL HIGH (ref 0.00–0.07)
Basophils Absolute: 0.1 10*3/uL (ref 0.0–0.1)
Basophils Relative: 1 %
Eosinophils Absolute: 0.2 10*3/uL (ref 0.0–0.5)
Eosinophils Relative: 3 %
HCT: 38.9 % (ref 36.0–46.0)
Hemoglobin: 12.1 g/dL (ref 12.0–15.0)
Immature Granulocytes: 3 %
Lymphocytes Relative: 16 %
Lymphs Abs: 1.2 10*3/uL (ref 0.7–4.0)
MCH: 23.3 pg — ABNORMAL LOW (ref 26.0–34.0)
MCHC: 31.1 g/dL (ref 30.0–36.0)
MCV: 75 fL — ABNORMAL LOW (ref 80.0–100.0)
Monocytes Absolute: 1.1 10*3/uL — ABNORMAL HIGH (ref 0.1–1.0)
Monocytes Relative: 14 %
Neutro Abs: 4.9 10*3/uL (ref 1.7–7.7)
Neutrophils Relative %: 63 %
Platelet Count: 327 10*3/uL (ref 150–400)
RBC: 5.19 MIL/uL — ABNORMAL HIGH (ref 3.87–5.11)
RDW: 18.8 % — ABNORMAL HIGH (ref 11.5–15.5)
Smear Review: NORMAL
WBC Count: 7.7 10*3/uL (ref 4.0–10.5)
nRBC: 0.3 % — ABNORMAL HIGH (ref 0.0–0.2)

## 2023-10-01 LAB — CMP (CANCER CENTER ONLY)
ALT: 29 U/L (ref 0–44)
AST: 32 U/L (ref 15–41)
Albumin: 3.4 g/dL — ABNORMAL LOW (ref 3.5–5.0)
Alkaline Phosphatase: 108 U/L (ref 38–126)
Anion gap: 10 (ref 5–15)
BUN: 26 mg/dL — ABNORMAL HIGH (ref 8–23)
CO2: 23 mmol/L (ref 22–32)
Calcium: 8.4 mg/dL — ABNORMAL LOW (ref 8.9–10.3)
Chloride: 99 mmol/L (ref 98–111)
Creatinine: 1.4 mg/dL — ABNORMAL HIGH (ref 0.44–1.00)
GFR, Estimated: 38 mL/min — ABNORMAL LOW (ref >60)
Glucose, Bld: 120 mg/dL — ABNORMAL HIGH (ref 70–99)
Potassium: 4.3 mmol/L (ref 3.5–5.1)
Sodium: 132 mmol/L — ABNORMAL LOW (ref 135–145)
Total Bilirubin: 0.6 mg/dL (ref 0.0–1.2)
Total Protein: 6.9 g/dL (ref 6.5–8.1)

## 2023-10-01 NOTE — Progress Notes (Signed)
 Clinical Pharmacist Practitioner Clinic [facility]  Telephone:([phone] [phone]  Patient Care Team: Eartha Gold, MD as PCP - General (Infectious Diseases) Drake Gens, RN as Oncology Nurse Navigator Glenis Langdon, MD as Consulting Physician (Radiation Oncology) Shellie Dials, MD as Consulting Physician (Oncology)   Name of the patient: Hannah Yu  [id number]  April 29, 1946   Date of visit: 10/01/23  HPI: Patient is a 78 y.o. female with  progressive NSCLC, KRAS G12C mutation positive ([place] 03/3023). Patient started adagrasib  on 08/20/23.   Reason for Consult: Oral chemotherapy follow-up for adagrasib  therapy.   PAST MEDICAL HISTORY: Past Medical History:  Diagnosis Date   Acid reflux    Anemia    Anesthesia complication    Tachycardia previously, now on metoprolol    Arthritis    09/02/2019: per patient "have it real bad in both hands and back"   Difficult intubation    had to terminate intubation unable to advance tube for cholecystectomy 2017?   DVT (deep venous thrombosis) (HCC)    leg   Hypertension    Sciatica    09/02/2019: has had it for about 3 years    HEMATOLOGY/ONCOLOGY HISTORY:  Oncology History  Adenocarcinoma of left lung (HCC)  03/19/2023 Initial Diagnosis   Adenocarcinoma of left lung (HCC)   03/19/2023 Cancer Staging   Staging form: Lung, AJCC 8th Edition - Clinical stage from 03/19/2023: Stage IVB (cTX, cN2, cM1c) - Signed by Shellie Dials, MD on 03/19/2023 Stage prefix: Initial diagnosis   05/07/2023 - 07/16/2023 Chemotherapy   Patient is on Treatment Plan : LUNG NSCLC Pembrolizumab  (200) q21d       ALLERGIES:  is allergic to ezetimibe, codeine, cortisone, and lovastatin.  MEDICATIONS:  Current Outpatient Medications  Medication Sig Dispense Refill   adagrasib  (KRAZATI ) 200 MG tablet Take 3 tablets (600 mg total) by mouth 2 (two) times daily. 180 tablet 0   aspirin  EC 81 MG tablet  Take 162 mg by mouth daily. Swallow whole.     Biotin 10 MG CAPS      cholecalciferol  (VITAMIN D3) 25 MCG (1000 UNIT) tablet Take 1,000 Units by mouth daily.     dexamethasone  (DECADRON ) 2 MG tablet Take 1 tablet (2 mg total) by mouth 2 (two) times daily with a meal. Per MD instructions (Patient not taking: Reported on 09/17/2023) 11 tablet 0   FIBER PO Take 1 tablet by mouth daily.     furosemide  (LASIX ) 40 MG tablet Take 40 mg by mouth daily. 40 mg alternating 80 mg every other day     ibuprofen (ADVIL) 200 MG tablet Take 400 mg by mouth every 6 (six) hours as needed for mild pain or moderate pain.     Ibuprofen-diphenhydrAMINE  HCl (IBUPROFEN PM) 200-25 MG CAPS Take 2 tablets by mouth at bedtime as needed (sleep).     LORazepam (ATIVAN) 0.5 MG tablet Take by mouth.     losartan  (COZAAR ) 100 MG tablet Take 100 mg by mouth daily.     magnesium oxide (MAG-OX) 400 (240 Mg) MG tablet Take 400 mg by mouth daily.     metoprolol  succinate (TOPROL -XL) 50 MG 24 hr tablet Take 50 mg by mouth daily.     omeprazole (PRILOSEC) 40 MG capsule Take 40 mg by mouth daily as needed.     OVER THE COUNTER MEDICATION Take 1 capsule by mouth daily. CBD gummie     prochlorperazine  (COMPAZINE ) 10 MG tablet TAKE ONE TABLET BY MOUTH EVERY 6 HOURS  AS NEEDED FOR NAUSEA / VOMITING 30 tablet 2   Tetrahydrozoline HCl (VISINE OP) Place 1 drop into both eyes daily as needed (dry eyes).     vitamin B-12 (CYANOCOBALAMIN ) 1000 MCG tablet Take 1,000 mcg by mouth daily.     No current facility-administered medications for this visit.    VITAL SIGNS: BP (!) 137/50   Pulse 88   Temp 99.9 F (37.7 C) (Tympanic)   Resp 18   Wt 82.2 kg (181 lb 4.8 oz)   BMI 35.41 kg/m  Filed Weights   10/01/23 1041  Weight: 82.2 kg (181 lb 4.8 oz)    Estimated body mass index is 35.41 kg/m as calculated from the following:   Height as of 09/03/23: 5' (1.524 m).   Weight as of this encounter: 82.2 kg (181 lb 4.8 oz).  LABS: CBC:     Component Value Date/Time   WBC 7.7 10/01/2023 1024   WBC 16.1 (H) 09/17/2023 1008   HGB 12.1 10/01/2023 1024   HCT 38.9 10/01/2023 1024   PLT 327 10/01/2023 1024   MCV 75.0 (L) 10/01/2023 1024   NEUTROABS 4.9 10/01/2023 1024   LYMPHSABS 1.2 10/01/2023 1024   MONOABS 1.1 (H) 10/01/2023 1024   EOSABS 0.2 10/01/2023 1024   BASOSABS 0.1 10/01/2023 1024   Comprehensive Metabolic Panel:    Component Value Date/Time   NA 132 (L) 10/01/2023 1024   K 4.3 10/01/2023 1024   CL 99 10/01/2023 1024   CO2 23 10/01/2023 1024   BUN 26 (H) 10/01/2023 1024   CREATININE 1.40 (H) 10/01/2023 1024   GLUCOSE 120 (H) 10/01/2023 1024   CALCIUM 8.4 (L) 10/01/2023 1024   AST 32 10/01/2023 1024   ALT 29 10/01/2023 1024   ALKPHOS 108 10/01/2023 1024   BILITOT 0.6 10/01/2023 1024   PROT 6.9 10/01/2023 1024   ALBUMIN  3.4 (L) 10/01/2023 1024     Present during today's visit: patient and her friend Hannah Yu  Assessment and Plan: CBC/CMP reviewed, will continue to monitor SCr but no treatment changes needed at this time  Continue adagrasib  at original dose 600mg  twice daily.  Patient will have repeat PET prior to her next MD appt    Oral Chemotherapy Side Effect/Intolerance:  Nausea: patient has occasionally nausea which responds well to antiemetics No reported diarrhea or fatigue   Oral Chemotherapy Adherence: No missed doses reported No patient barriers to medication adherence identified.   New medications: None reported  Medication Access Issues: No issues, patient fills at [facility] (Specialty)  Patient expressed understanding and was in agreement with this plan. She also understands that She can call clinic at any time with any questions, concerns, or complaints.   Follow-up plan: RTC as scheduled  Thank you for allowing me to participate in the care of this very pleasant patient.   Time Total: 15 mins  Visit consisted of counseling and education on dealing with issues of  symptom management in the setting of serious and potentially life-threatening illness.Greater than 50%  of this time was spent counseling and coordinating care related to the above assessment and plan.  Signed by: Cosby Proby N. Zakaria Sedor, PharmD, Lorraine Roses, CPP Hematology/Oncology Clinical Pharmacist Practitioner [place] [phone]  10/01/2023 12:53 PM

## 2023-10-07 ENCOUNTER — Ambulatory Visit
Admission: RE | Admit: 2023-10-07 | Discharge: 2023-10-07 | Disposition: A | Discharge status: Home / Self Care | Source: Ambulatory Visit | Attending: Oncology | Admitting: Oncology

## 2023-10-07 DIAGNOSIS — R918 Other nonspecific abnormal finding of lung field: Secondary | ICD-10-CM | POA: Insufficient documentation

## 2023-10-07 DIAGNOSIS — C3492 Malignant neoplasm of unspecified part of left bronchus or lung: Secondary | ICD-10-CM | POA: Diagnosis not present

## 2023-10-07 DIAGNOSIS — R935 Abnormal findings on diagnostic imaging of other abdominal regions, including retroperitoneum: Secondary | ICD-10-CM | POA: Diagnosis not present

## 2023-10-07 LAB — GLUCOSE, CAPILLARY: Glucose-Capillary: 116 mg/dL — ABNORMAL HIGH (ref 70–99)

## 2023-10-07 MED ORDER — FLUDEOXYGLUCOSE F - 18 (FDG) INJECTION
9.4000 | Freq: Once | INTRAVENOUS | Status: AC | PRN
Start: 2023-10-07 — End: 2023-10-07
  Administered 2023-10-07: 9.64 via INTRAVENOUS

## 2023-10-08 ENCOUNTER — Encounter: Payer: Self-pay | Admitting: Radiation Oncology

## 2023-10-08 ENCOUNTER — Ambulatory Visit
Admission: RE | Admit: 2023-10-08 | Discharge: 2023-10-08 | Disposition: A | Discharge status: Home / Self Care | Source: Ambulatory Visit | Attending: Radiation Oncology | Admitting: Radiation Oncology

## 2023-10-08 VITALS — BP 138/79 | HR 76 | Temp 98.2°F | Resp 16 | Wt 180.0 lb

## 2023-10-08 DIAGNOSIS — D72829 Elevated white blood cell count, unspecified: Secondary | ICD-10-CM | POA: Insufficient documentation

## 2023-10-08 DIAGNOSIS — C3492 Malignant neoplasm of unspecified part of left bronchus or lung: Secondary | ICD-10-CM | POA: Insufficient documentation

## 2023-10-08 DIAGNOSIS — C7931 Secondary malignant neoplasm of brain: Secondary | ICD-10-CM | POA: Diagnosis present

## 2023-10-08 DIAGNOSIS — Z510 Encounter for antineoplastic radiation therapy: Secondary | ICD-10-CM | POA: Diagnosis not present

## 2023-10-08 DIAGNOSIS — C7951 Secondary malignant neoplasm of bone: Secondary | ICD-10-CM | POA: Insufficient documentation

## 2023-10-08 NOTE — Progress Notes (Signed)
 Radiation Oncology Follow up Note  Name: Hannah Yu   Date:   10/08/2023 MRN:  [id number] DOB: Jan 17, 1946    This 78 y.o. 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  She has had multiple courses of treatment in the past.  She is doing fairly well specifically denies any pain except for some occasional pain on rotation of her left upper extremity.  She specifically [place] cough hemoptysis or chest tightness that she is currently on Adagrasib  .  She does have PD-L1 5% and K-ras G12C mutated.  She had a recent PET CT scan yesterday which I have reviewed again shows progression of disease in the left acromion which has increased in hypermetabolic activity and destruction over the last PET CT scan back in March.  She is having no neurologic complaints has a stable neurologic exam.  COMPLICATIONS OF TREATMENT: none  FOLLOW UP COMPLIANCE: keeps appointments   PHYSICAL EXAM:  BP 138/79   Pulse 76   Temp 98.2 F (36.8 C) (Tympanic)   Resp 16   Wt 180 lb (81.6 kg)   BMI 35.15 kg/m  Crude visual fields within normal range motor sensory and Diette levels are equal and symmetric in upper lower extremities.  There is pain on deep palpation of her chromium on the left side as well as some pain on range of motion.  Well-developed well-nourished patient in NAD. HEENT reveals PERLA, EOMI, discs not visualized.  Oral cavity is clear. No oral mucosal lesions are identified. Neck is clear without evidence of cervical or supraclavicular adenopathy. Lungs are clear to A&P. Cardiac examination is essentially unremarkable with regular rate and rhythm without murmur rub or thrill. Abdomen is benign  with no organomegaly or masses noted. Motor sensory and DTR levels are equal and symmetric in the upper and lower extremities. Cranial nerves II through XII are grossly intact. Proprioception is intact. No peripheral adenopathy or edema is identified. No motor or sensory levels are noted. Crude visual fields are within normal range.  RADIOLOGY RESULTS: PET CT scan reviewed compatible with above-stated findings  PLAN: At this time elected ahead with SBRT to her left acromion.  Will plan on delivering 30 Gray in 5 fractions.  Risks and benefits of treatment occluding extreme low side effect profile possibly some skin reaction were reviewed with the patient.  I have set her up for simulation early next week.  Patient will continue on Adagrasib  at this time.  Patient comprehends her recommendations well some appointment was given.  I would like to take this opportunity to thank you for allowing me to participate in the care of your patient.Glenis Langdon, MD

## 2023-10-12 ENCOUNTER — Other Ambulatory Visit: Payer: Self-pay | Admitting: Pharmacist

## 2023-10-12 DIAGNOSIS — C3492 Malignant neoplasm of unspecified part of left bronchus or lung: Secondary | ICD-10-CM

## 2023-10-12 MED ORDER — ONDANSETRON HCL 8 MG PO TABS
8.0000 mg | ORAL_TABLET | Freq: Three times a day (TID) | ORAL | 0 refills | Status: DC | PRN
Start: 2023-10-12 — End: 2024-01-08

## 2023-10-12 MED ORDER — DIPHENOXYLATE-ATROPINE 2.5-0.025 MG PO TABS: 1.0000 | ORAL_TABLET | Freq: Four times a day (QID) | ORAL | 0 refills | Status: DC | PRN

## 2023-10-12 NOTE — Telephone Encounter (Signed)
 Patient called to report that her diarrhea has not been responsive to her loperamide over the last 2 weeks. At most she is having to take 6 tablets of loperamide per day.   Patient also reporting nausea that has not been controlled with prochlorperazine . Will send in rx for ondansetron  but patient will need ECG check at her next office visit to make sure she is okay using this in combination with adagrasib . ECG on  08/24/23 showed QTc of 462 msec.

## 2023-10-13 ENCOUNTER — Ambulatory Visit
Admission: RE | Admit: 2023-10-13 | Discharge: 2023-10-13 | Disposition: A | Discharge status: Home / Self Care | Source: Ambulatory Visit | Attending: Radiation Oncology | Admitting: Radiation Oncology

## 2023-10-13 DIAGNOSIS — Z510 Encounter for antineoplastic radiation therapy: Secondary | ICD-10-CM | POA: Diagnosis not present

## 2023-10-14 DIAGNOSIS — Z510 Encounter for antineoplastic radiation therapy: Secondary | ICD-10-CM | POA: Diagnosis not present

## 2023-10-15 ENCOUNTER — Other Ambulatory Visit: Payer: Self-pay

## 2023-10-15 ENCOUNTER — Inpatient Hospital Stay (HOSPITAL_BASED_OUTPATIENT_CLINIC_OR_DEPARTMENT_OTHER): Admitting: Oncology

## 2023-10-15 ENCOUNTER — Inpatient Hospital Stay: Admitting: Pharmacist

## 2023-10-15 ENCOUNTER — Inpatient Hospital Stay

## 2023-10-15 ENCOUNTER — Encounter: Payer: Self-pay | Admitting: Oncology

## 2023-10-15 ENCOUNTER — Other Ambulatory Visit: Payer: Self-pay | Admitting: Pharmacist

## 2023-10-15 VITALS — BP 150/54 | HR 75 | Temp 97.9°F | Resp 16 | Wt 182.0 lb

## 2023-10-15 DIAGNOSIS — Z87891 Personal history of nicotine dependence: Secondary | ICD-10-CM | POA: Insufficient documentation

## 2023-10-15 DIAGNOSIS — C3492 Malignant neoplasm of unspecified part of left bronchus or lung: Secondary | ICD-10-CM

## 2023-10-15 DIAGNOSIS — E871 Hypo-osmolality and hyponatremia: Secondary | ICD-10-CM | POA: Insufficient documentation

## 2023-10-15 DIAGNOSIS — Z923 Personal history of irradiation: Secondary | ICD-10-CM | POA: Insufficient documentation

## 2023-10-15 DIAGNOSIS — C3432 Malignant neoplasm of lower lobe, left bronchus or lung: Secondary | ICD-10-CM | POA: Insufficient documentation

## 2023-10-15 DIAGNOSIS — N289 Disorder of kidney and ureter, unspecified: Secondary | ICD-10-CM | POA: Insufficient documentation

## 2023-10-15 DIAGNOSIS — C7931 Secondary malignant neoplasm of brain: Secondary | ICD-10-CM | POA: Insufficient documentation

## 2023-10-15 DIAGNOSIS — D72829 Elevated white blood cell count, unspecified: Secondary | ICD-10-CM | POA: Insufficient documentation

## 2023-10-15 DIAGNOSIS — R0602 Shortness of breath: Secondary | ICD-10-CM | POA: Insufficient documentation

## 2023-10-15 DIAGNOSIS — I10 Essential (primary) hypertension: Secondary | ICD-10-CM | POA: Diagnosis not present

## 2023-10-15 DIAGNOSIS — C7951 Secondary malignant neoplasm of bone: Secondary | ICD-10-CM | POA: Insufficient documentation

## 2023-10-15 DIAGNOSIS — Z8679 Personal history of other diseases of the circulatory system: Secondary | ICD-10-CM | POA: Diagnosis not present

## 2023-10-15 DIAGNOSIS — Z510 Encounter for antineoplastic radiation therapy: Secondary | ICD-10-CM | POA: Diagnosis not present

## 2023-10-15 LAB — CBC WITH DIFFERENTIAL (CANCER CENTER ONLY)
Abs Immature Granulocytes: 0.45 10*3/uL — ABNORMAL HIGH (ref 0.00–0.07)
Basophils Absolute: 0.1 10*3/uL (ref 0.0–0.1)
Basophils Relative: 1 %
Eosinophils Absolute: 0 10*3/uL (ref 0.0–0.5)
Eosinophils Relative: 0 %
HCT: 37.1 % (ref 36.0–46.0)
Hemoglobin: 11.5 g/dL — ABNORMAL LOW (ref 12.0–15.0)
Immature Granulocytes: 4 %
Lymphocytes Relative: 9 %
Lymphs Abs: 1 10*3/uL (ref 0.7–4.0)
MCH: 23.1 pg — ABNORMAL LOW (ref 26.0–34.0)
MCHC: 31 g/dL (ref 30.0–36.0)
MCV: 74.6 fL — ABNORMAL LOW (ref 80.0–100.0)
Monocytes Absolute: 1.9 10*3/uL — ABNORMAL HIGH (ref 0.1–1.0)
Monocytes Relative: 17 %
Neutro Abs: 7.4 10*3/uL (ref 1.7–7.7)
Neutrophils Relative %: 69 %
Platelet Count: 384 10*3/uL (ref 150–400)
RBC: 4.97 MIL/uL (ref 3.87–5.11)
RDW: 18.6 % — ABNORMAL HIGH (ref 11.5–15.5)
WBC Count: 10.8 10*3/uL — ABNORMAL HIGH (ref 4.0–10.5)
nRBC: 0 % (ref 0.0–0.2)

## 2023-10-15 LAB — CMP (CANCER CENTER ONLY)
ALT: 21 U/L (ref 0–44)
AST: 30 U/L (ref 15–41)
Albumin: 3.7 g/dL (ref 3.5–5.0)
Alkaline Phosphatase: 138 U/L — ABNORMAL HIGH (ref 38–126)
Anion gap: 11 (ref 5–15)
BUN: 37 mg/dL — ABNORMAL HIGH (ref 8–23)
CO2: 24 mmol/L (ref 22–32)
Calcium: 8.7 mg/dL — ABNORMAL LOW (ref 8.9–10.3)
Chloride: 96 mmol/L — ABNORMAL LOW (ref 98–111)
Creatinine: 1.9 mg/dL — ABNORMAL HIGH (ref 0.44–1.00)
GFR, Estimated: 27 mL/min — ABNORMAL LOW (ref >60)
Glucose, Bld: 127 mg/dL — ABNORMAL HIGH (ref 70–99)
Potassium: 4.5 mmol/L (ref 3.5–5.1)
Sodium: 131 mmol/L — ABNORMAL LOW (ref 135–145)
Total Bilirubin: 0.8 mg/dL (ref 0.0–1.2)
Total Protein: 7.3 g/dL (ref 6.5–8.1)

## 2023-10-15 MED ORDER — KRAZATI 200 MG PO TABS
600.0000 mg | ORAL_TABLET | Freq: Two times a day (BID) | ORAL | 0 refills | Status: DC
  Filled 2023-10-15: qty 180, 30d supply, fill #0

## 2023-10-15 NOTE — Progress Notes (Signed)
 Specialty Pharmacy Refill Coordination Note  Hannah Yu is a 78 y.o. female contacted today regarding refills of specialty medication(s) Adagrasib  (Krazati )   Patient requested [name] at [facility] at [place] date: 10/16/23   Medication will be filled on 10/16/23.   This fill date is pending response to refill request from provider. Patient is aware and if they have not received fill by intended date they must follow up with pharmacy.

## 2023-10-15 NOTE — Progress Notes (Signed)
 Clinical Pharmacist Practitioner Clinic [facility]  Telephone:([phone] [phone]  Patient Care Team: Eartha Gold, MD as PCP - General (Infectious Diseases) Drake Gens, RN as Oncology Nurse Navigator Glenis Langdon, MD as Consulting Physician (Radiation Oncology) Shellie Dials, MD as Consulting Physician (Oncology)   Name of the patient: Hannah Yu  [id number]  February 21, 1946   Date of visit: 10/15/23  HPI: Patient is a 78 y.o. female with  progressive NSCLC, KRAS G12C mutation positive ([place] 03/3023). Patient started adagrasib  on 08/20/23.   Reason for Consult: Oral chemotherapy follow-up for adagrasib  therapy.   PAST MEDICAL HISTORY: Past Medical History:  Diagnosis Date   Acid reflux    Anemia    Anesthesia complication    Tachycardia previously, now on metoprolol    Arthritis    09/02/2019: per patient have it real bad in both hands and back   Difficult intubation    had to terminate intubation unable to advance tube for cholecystectomy 2017?   DVT (deep venous thrombosis) (HCC)    leg   Hypertension    Sciatica    09/02/2019: has had it for about 3 years    HEMATOLOGY/ONCOLOGY HISTORY:  Oncology History  Adenocarcinoma of left lung (HCC)  03/19/2023 Initial Diagnosis   Adenocarcinoma of left lung (HCC)   03/19/2023 Cancer Staging   Staging form: Lung, AJCC 8th Edition - Clinical stage from 03/19/2023: Stage IVB (cTX, cN2, cM1c) - Signed by Shellie Dials, MD on 03/19/2023 Stage prefix: Initial diagnosis   05/07/2023 - 07/16/2023 Chemotherapy   Patient is on Treatment Plan : LUNG NSCLC Pembrolizumab  (200) q21d       ALLERGIES:  is allergic to ezetimibe, codeine, cortisone, and lovastatin.  MEDICATIONS:  Current Outpatient Medications  Medication Sig Dispense Refill   adagrasib  (KRAZATI ) 200 MG tablet Take 3 tablets (600 mg total) by mouth 2 (two) times daily. 180 tablet 0   aspirin  EC 81 MG tablet  Take 162 mg by mouth daily. Swallow whole.     Biotin 10 MG CAPS      cholecalciferol  (VITAMIN D3) 25 MCG (1000 UNIT) tablet Take 1,000 Units by mouth daily.     diphenoxylate-atropine (LOMOTIL) 2.5-0.025 MG tablet Take 1 tablet by mouth 4 (four) times daily as needed for diarrhea or loose stools. 30 tablet 0   FIBER PO Take 1 tablet by mouth daily.     furosemide  (LASIX ) 40 MG tablet Take 40 mg by mouth daily. 40 mg alternating 80 mg every other day     ibuprofen (ADVIL) 200 MG tablet Take 400 mg by mouth every 6 (six) hours as needed for mild pain or moderate pain.     Ibuprofen-diphenhydrAMINE  HCl (IBUPROFEN PM) 200-25 MG CAPS Take 2 tablets by mouth at bedtime as needed (sleep).     LORazepam (ATIVAN) 0.5 MG tablet Take by mouth.     losartan  (COZAAR ) 100 MG tablet Take 100 mg by mouth daily.     magnesium oxide (MAG-OX) 400 (240 Mg) MG tablet Take 400 mg by mouth daily.     metoprolol  succinate (TOPROL -XL) 50 MG 24 hr tablet Take 50 mg by mouth daily.     omeprazole (PRILOSEC) 40 MG capsule Take 40 mg by mouth daily as needed.     ondansetron  (ZOFRAN ) 8 MG tablet Take 1 tablet (8 mg total) by mouth every 8 (eight) hours as needed for nausea or vomiting. 20 tablet 0   OVER THE COUNTER MEDICATION Take 1 capsule by mouth  daily. CBD gummie     prochlorperazine  (COMPAZINE ) 10 MG tablet TAKE ONE TABLET BY MOUTH EVERY 6 HOURS AS NEEDED FOR NAUSEA / VOMITING 30 tablet 2   Tetrahydrozoline HCl (VISINE OP) Place 1 drop into both eyes daily as needed (dry eyes).     traZODone (DESYREL) 50 MG tablet Take 1 tablet by mouth at bedtime.     vitamin B-12 (CYANOCOBALAMIN ) 1000 MCG tablet Take 1,000 mcg by mouth daily.     No current facility-administered medications for this visit.    VITAL SIGNS: There were no vitals taken for this visit. There were no vitals filed for this visit.   Estimated body mass index is 35.54 kg/m as calculated from the following:   Height as of 09/03/23: 5' (1.524 m).    Weight as of an earlier encounter on 10/15/23: 82.6 kg (182 lb).  LABS: CBC:    Component Value Date/Time   WBC 10.8 (H) 10/15/2023 0946   WBC 16.1 (H) 09/17/2023 1008   HGB 11.5 (L) 10/15/2023 0946   HCT 37.1 10/15/2023 0946   PLT 384 10/15/2023 0946   MCV 74.6 (L) 10/15/2023 0946   NEUTROABS 7.4 10/15/2023 0946   LYMPHSABS 1.0 10/15/2023 0946   MONOABS 1.9 (H) 10/15/2023 0946   EOSABS 0.0 10/15/2023 0946   BASOSABS 0.1 10/15/2023 0946   Comprehensive Metabolic Panel:    Component Value Date/Time   NA 131 (L) 10/15/2023 0945   K 4.5 10/15/2023 0945   CL 96 (L) 10/15/2023 0945   CO2 24 10/15/2023 0945   BUN 37 (H) 10/15/2023 0945   CREATININE 1.90 (H) 10/15/2023 0945   GLUCOSE 127 (H) 10/15/2023 0945   CALCIUM 8.7 (L) 10/15/2023 0945   AST 30 10/15/2023 0945   ALT 21 10/15/2023 0945   ALKPHOS 138 (H) 10/15/2023 0945   BILITOT 0.8 10/15/2023 0945   PROT 7.3 10/15/2023 0945   ALBUMIN  3.7 10/15/2023 0945     Present during today's visit: patient and her friend [name]  Assessment and Plan: CBC/CMP reviewed, will continue to monitor SCr it is increased today but [name] has also been having diarrhea over the last few weeks. Will continue adagrasib  at this time with come SCr monitoring  Continue adagrasib  600mg  twice daily.  Recent PET imaging showed treatment response Patient called earlier this week about issues with nausea and diarrhea but has not yet tried the new medication sent for management, ondansetron  and Lomotil   Oral Chemotherapy Side Effect/Intolerance:  Nausea: patient reports that prochlorperazine  is not working for her nausea, rx sent for ondansetron , discussed prn use with patient Due to rsik of QTc prolongation with adagrasib , ECG was checked today and showed QTc within normal limits and repeat ECg will be checked at next visit Diarrhea: rx was sent for Lomotil, discussed use with patient, also asked patient to increase hydration due to the extra fluid loss  with diarrhea Fatigue: occasional and manageable   Oral Chemotherapy Adherence: No missed doses reported No patient barriers to medication adherence identified.   New medications: None reported  Medication Access Issues: No issues, patient fills at [facility] (Specialty)  Patient expressed understanding and was in agreement with this plan. She also understands that She can call clinic at any time with any questions, concerns, or complaints.   Follow-up plan: RTC as scheduled  Thank you for allowing me to participate in the care of this very pleasant patient.   Time Total: 15 mins  Visit consisted of counseling and education  on dealing with issues of symptom management in the setting of serious and potentially life-threatening illness.Greater than 50%  of this time was spent counseling and coordinating care related to the above assessment and plan.  Signed by: Jeris Easterly N. Zamyra Allensworth, PharmD, Lorraine Roses, CPP Hematology/Oncology Clinical Pharmacist Practitioner [place] [phone]  10/15/2023 2:54 PM

## 2023-10-15 NOTE — Progress Notes (Signed)
 Patient had a PET scan on 10/07/2023. She is doing ok, no new questions or concerns for the doctor today besides her most recent PET scan results.

## 2023-10-15 NOTE — Progress Notes (Signed)
 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  INTERVAL HISTORY: Patient returns clinic today for repeat laboratory work, further evaluation, discussion of her PET scan results, and continuation of Adagrasib .  She currently feels well and is asymptomatic.  She is tolerating treatments well without significant side effects.  She has no neurologic complaints.  She denies any recent fevers.  She has a good appetite and denies weight loss.  She denies any chest pain, shortness of breath, cough, or hemoptysis.  She denies any nausea, vomiting, constipation, or diarrhea.  She has no urinary complaints.  Patient offers no specific complaints today.  REVIEW OF SYSTEMS:   Review of Systems  Constitutional: Negative.  Negative for fever, malaise/fatigue and weight loss.  Respiratory: Negative.  Negative for cough, hemoptysis and shortness of breath.   Cardiovascular: Negative.  Negative for chest pain and leg swelling.  Gastrointestinal: Negative.  Negative for abdominal pain.  Genitourinary: Negative.  Negative for dysuria.  Musculoskeletal: Negative.  Negative for back pain and joint pain.  Skin: Negative.  Negative for rash.  Neurological: Negative.  Negative for dizziness, focal weakness, weakness and headaches.  Psychiatric/Behavioral: Negative.  The patient is not nervous/anxious.     As per HPI. Otherwise, a complete review of systems is negative.  PAST MEDICAL HISTORY: Past Medical History:  Diagnosis Date   Acid reflux    Anemia     Anesthesia complication    Tachycardia previously, now on metoprolol    Arthritis    09/02/2019: per patient have it real bad in both hands and back   Difficult intubation    had to terminate intubation unable to advance tube for cholecystectomy 2017?   DVT (deep venous thrombosis) (HCC)    leg   Hypertension    Sciatica    09/02/2019: has had it for about 3 years    PAST SURGICAL HISTORY: Past Surgical History:  Procedure Laterality Date   ABDOMINAL HYSTERECTOMY     CATARACT EXTRACTION W/ INTRAOCULAR LENS IMPLANT Bilateral 2007   eyes done within 1 month of each other.   IR IMAGING GUIDED PORT INSERTION  05/19/2023   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   TOTAL HIP ARTHROPLASTY Right 09/06/2019   Procedure: RIGHT TOTAL HIP ARTHROPLASTY ANTERIOR APPROACH;  Surgeon: Arnie Lao, MD;  Location: [facility] OR;  Service: Orthopedics;  Laterality: Right;   TOTAL HIP ARTHROPLASTY Left 07/31/2020   Procedure: LEFT TOTAL HIP ARTHROPLASTY ANTERIOR APPROACH;  Surgeon: Arnie Lao, MD;  Location: [facility] OR;  Service: Orthopedics;  Laterality: Left;   VIDEO BRONCHOSCOPY WITH ENDOBRONCHIAL ULTRASOUND N/A 10/06/2022   Procedure: VIDEO BRONCHOSCOPY WITH ENDOBRONCHIAL ULTRASOUND;  Surgeon: Erskin Hearing, MD;  Location: [facility] ORS;  Service: Thoracic;  Laterality: N/A;    FAMILY HISTORY: Family History  Problem Relation Age of Onset   Breast cancer Sister 31    ADVANCED DIRECTIVES (Y/N):  N  HEALTH MAINTENANCE: Social History   Tobacco Use   Smoking status: Former    Current packs/day: 0.00    Types: Cigarettes    Quit  date: 01/02/1993    Years since quitting: 30.8   Smokeless tobacco: Never  Vaping Use   Vaping status: Never Used  Substance Use Topics   Alcohol use: Yes    Alcohol/week: 4.0 standard drinks of alcohol    Types: 4 Glasses of wine per week    Comment: 09/02/2019: per patient ever so often    Drug use: No     Colonoscopy:  PAP:  Bone density:  Lipid panel:  Allergies  Allergen Reactions   Ezetimibe Other (See Comments)    Body aches, and stiffness   Codeine Diarrhea   Cortisone Swelling    injections   Lovastatin Other (See Comments)    Muscle cramps    Current Outpatient Medications  Medication Sig Dispense Refill   adagrasib  (KRAZATI ) 200 MG tablet Take 3 tablets (600 mg total) by mouth 2 (two) times daily. 180 tablet 0   aspirin  EC 81 MG tablet Take 162 mg by mouth daily. Swallow whole.     Biotin 10 MG CAPS      cholecalciferol  (VITAMIN D3) 25 MCG (1000 UNIT) tablet Take 1,000 Units by mouth daily.     diphenoxylate-atropine (LOMOTIL) 2.5-0.025 MG tablet Take 1 tablet by mouth 4 (four) times daily as needed for diarrhea or loose stools. 30 tablet 0   FIBER PO Take 1 tablet by mouth daily.     furosemide  (LASIX ) 40 MG tablet Take 40 mg by mouth daily. 40 mg alternating 80 mg every other day     ibuprofen (ADVIL) 200 MG tablet Take 400 mg by mouth every 6 (six) hours as needed for mild pain or moderate pain.     Ibuprofen-diphenhydrAMINE  HCl (IBUPROFEN PM) 200-25 MG CAPS Take 2 tablets by mouth at bedtime as needed (sleep).     LORazepam (ATIVAN) 0.5 MG tablet Take by mouth.     losartan  (COZAAR ) 100 MG tablet Take 100 mg by mouth daily.     magnesium oxide (MAG-OX) 400 (240 Mg) MG tablet Take 400 mg by mouth daily.     metoprolol  succinate (TOPROL -XL) 50 MG 24 hr tablet Take 50 mg by mouth daily.     omeprazole (PRILOSEC) 40 MG capsule Take 40 mg by mouth daily as needed.     ondansetron  (ZOFRAN ) 8 MG tablet Take 1 tablet (8 mg total) by mouth every 8 (eight) hours as needed for nausea or vomiting. 20 tablet 0   OVER THE COUNTER MEDICATION Take 1 capsule by mouth daily. CBD gummie     prochlorperazine  (COMPAZINE ) 10 MG tablet TAKE ONE TABLET BY MOUTH EVERY 6 HOURS AS NEEDED FOR NAUSEA / VOMITING 30 tablet 2   Tetrahydrozoline HCl (VISINE OP) Place 1 drop into both  eyes daily as needed (dry eyes).     traZODone (DESYREL) 50 MG tablet Take 1 tablet by mouth at bedtime.     vitamin B-12 (CYANOCOBALAMIN ) 1000 MCG tablet Take 1,000 mcg by mouth daily.     No current facility-administered medications for this visit.    OBJECTIVE: Vitals:   10/15/23 1017  BP: (!) 150/54  Pulse: 75  Resp: 16  Temp: 97.9 F (36.6 C)  SpO2: 94%      Body mass index is 35.54 kg/m.    ECOG FS:0 - Asymptomatic  General: Well-developed, well-nourished, no acute distress. Eyes: Pink conjunctiva, anicteric sclera. HEENT: Normocephalic, moist mucous membranes. Lungs: No audible wheezing or coughing. Heart: Regular rate and rhythm. Abdomen: Soft, nontender, no obvious distention. Musculoskeletal: No edema, cyanosis, or  clubbing. Neuro: Alert, answering all questions appropriately. Cranial nerves grossly intact. Skin: No rashes or petechiae noted. Psych: Normal affect.   LAB RESULTS:  Lab Results  Component Value Date   NA 131 (L) 10/15/2023   K 4.5 10/15/2023   CL 96 (L) 10/15/2023   CO2 24 10/15/2023   GLUCOSE 127 (H) 10/15/2023   BUN 37 (H) 10/15/2023   CREATININE 1.90 (H) 10/15/2023   CALCIUM 8.7 (L) 10/15/2023   PROT 7.3 10/15/2023   ALBUMIN  3.7 10/15/2023   AST 30 10/15/2023   ALT 21 10/15/2023   ALKPHOS 138 (H) 10/15/2023   BILITOT 0.8 10/15/2023   GFRNONAA 27 (L) 10/15/2023   GFRAA >60 09/10/2019    Lab Results  Component Value Date   WBC 10.8 (H) 10/15/2023   NEUTROABS 7.4 10/15/2023   HGB 11.5 (L) 10/15/2023   HCT 37.1 10/15/2023   MCV 74.6 (L) 10/15/2023   PLT 384 10/15/2023     STUDIES: NM PET Image Restage (PS) Skull Base to Thigh (F-18 FDG) Result Date: 10/15/2023 CLINICAL DATA:  Subsequent treatment strategy for lung adenocarcinoma. EXAM: NUCLEAR MEDICINE PET SKULL BASE TO THIGH TECHNIQUE: 9.6 mCi F-18 FDG was injected intravenously. Full-ring PET imaging was performed from the skull base to thigh after the radiotracer. CT data  was obtained and used for attenuation correction and anatomic localization. Fasting blood glucose: 116 mg/dl COMPARISON:  PET-CT scan 07/20/2023 FINDINGS: NECK: No hypermetabolic lymph nodes in the neck. Incidental CT findings: None. CHEST: Hypermetabolic pleural thickening in the LEFT lower lobe is decreased activity in activity. For example nodular thickening on image 50 measures 14 mm compared to 27 mm with SUV max equal 6.0 compared SUV max equal 14.7 (image 52). Hypermetabolic nodular pleural thickening in the superior LEFT lung along the fissure with SUV max equal 6.3 (image 44) compared SUV max equal 12.0. Interval decrease in pleural fluid within the LEFT lung. No new sites of metabolic activity in the lungs. RIGHT lung clear. No hypermetabolic mediastinal lymph nodes. Incidental CT findings: None. ABDOMEN/PELVIS: Hypermetabolic LEFT adrenal gland SUV max equal 5.5 compared SUV max 4.9. No significant abnormal activity in the RIGHT adrenal gland. Hypermetabolic LEFT external iliac lymph node is decreased in volume and metabolic activity SUV max equal 4.4 compared SUV max equal 7.0. Incidental CT findings: None. SKELETON: Intense metabolic activity associated with the LEFT acromion of the scapula with SUV max equal 9.3 decreased from SUV max equal 14.8. Intense activity associated with the LEFT hip prosthetic is favored purse itis. No new skeletal metastasis Incidental CT findings: None. IMPRESSION: 1. Overall positive response to treatment with no evidence disease progression. 2. 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 Decrease in pleural thickening. No new sites of malignancy in the thorax. 3. Similar hypermetabolic activity in the LEFT adrenal gland remains concerning for adrenal metastasis 4. Decreased metabolic activity associated with LEFT external iliac lymph node metastasis. 5. Decreased metabolic activity associated with the LEFT acromion scapula metastasis.  No new skeletal metastasis. Electronically Signed   By: Deboraha Fallow M.D.   On: 10/15/2023 10:19   Sleep Study Documents Result Date: 10/08/2023 Ordered by an unspecified provider.  ONCOLOGY HISTORY: Patient completed SBRT to the 2.7 cm irregular left lower lobe nodule in July 2024.  PET scan results from February 13, 2023 reviewed independently with residual low level of activity suggesting treatment response in the left lower lobe.  The AP window lymph node seen on subsequent CT scan revealed progressive  enlargement now measuring 1.7 x 1.3 cm as well as increased hypermetabolic activity.  Patient completed XRT to this lesion.  Patient has also completed XRT to her left pelvis.    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  Patient initiated Keytruda  on May 07, 2023.  PET scan on July 08, 2023 revealed progressive disease and treatment was changed to Adagrasib  (Krazati ) 600 mg twice daily given her K-ras mutation.  MRI of the brain on August 22, 2023 revealed a 1 cm left lateral cerebellum lesion.  Patient has completed XRT to this lesion.  Repeat PET scan results from October 07, 2023 reviewed independently and reported as above with overall reduction in disease burden with no new evidence of progressive or metastatic disease. Continue adagrasib  as prescribed.  Return to clinic in 2 weeks for laboratory work only and then in 4 weeks for laboratory work and further evaluation.  Appreciate clinical pharmacy in point.   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 Renal insufficiency: Creatinine has trended up to 1.9.  Recommended increase fluid intake and will repeat labs in 2 weeks as above. Hyponatremia: Chronic change.  Patient sodium is 131. Leukocytosis: Chronic and unchanged.  Likely  reactive.  Monitor.   Shortness of breath/cough: Chronic and unchanged.  Patient does not complain of this today.    Patient expressed understanding and was in agreement with this plan. She also understands that She can call clinic at any time with any questions, concerns, or complaints.    Cancer Staging  Adenocarcinoma of left lung [facility]) Staging form: Lung, AJCC 8th Edition - Clinical stage from 03/19/2023: Stage IVB (cTX, cN2, cM1c) - Signed by Shellie Dials, MD on 03/19/2023 Stage prefix: Initial diagnosis   Shellie Dials, MD   10/15/2023 10:41 AM

## 2023-10-16 ENCOUNTER — Other Ambulatory Visit: Payer: Self-pay

## 2023-10-22 ENCOUNTER — Other Ambulatory Visit: Payer: Self-pay

## 2023-10-22 NOTE — Progress Notes (Signed)
 Clinical Intervention Note  Clinical Intervention Notes: Patient reported initiating Lomotil  and Ondansetron . No DDIs identified between Lomotil  and Krazati . Ondansetron  and Krazati  may cause QTc prolongation. As documented in Alyson's visit notes, patient had ECG in office which was normal prior to initiating ondansetron  and will be checked again at next visit and followed by provider for this possible interaction.   Clinical Intervention Outcomes: Prevention of an adverse drug event   Rena Carnes Specialty Pharmacist

## 2023-10-29 ENCOUNTER — Ambulatory Visit
Admission: RE | Admit: 2023-10-29 | Discharge: 2023-10-29 | Disposition: A | Discharge status: Home / Self Care | Source: Ambulatory Visit | Attending: Radiation Oncology | Admitting: Radiation Oncology

## 2023-10-29 ENCOUNTER — Other Ambulatory Visit: Payer: Self-pay

## 2023-10-29 ENCOUNTER — Inpatient Hospital Stay

## 2023-10-29 DIAGNOSIS — Z510 Encounter for antineoplastic radiation therapy: Secondary | ICD-10-CM | POA: Diagnosis not present

## 2023-10-29 DIAGNOSIS — Z95828 Presence of other vascular implants and grafts: Secondary | ICD-10-CM

## 2023-10-29 LAB — CBC WITH DIFFERENTIAL (CANCER CENTER ONLY)
Abs Immature Granulocytes: 0.15 K/uL — ABNORMAL HIGH (ref 0.00–0.07)
Basophils Absolute: 0.1 K/uL (ref 0.0–0.1)
Basophils Relative: 1 %
Eosinophils Absolute: 0.2 K/uL (ref 0.0–0.5)
Eosinophils Relative: 2 %
HCT: 38.2 % (ref 36.0–46.0)
Hemoglobin: 11.7 g/dL — ABNORMAL LOW (ref 12.0–15.0)
Immature Granulocytes: 1 %
Lymphocytes Relative: 10 %
Lymphs Abs: 1.1 K/uL (ref 0.7–4.0)
MCH: 22.2 pg — ABNORMAL LOW (ref 26.0–34.0)
MCHC: 30.6 g/dL (ref 30.0–36.0)
MCV: 72.6 fL — ABNORMAL LOW (ref 80.0–100.0)
Monocytes Absolute: 1.4 K/uL — ABNORMAL HIGH (ref 0.1–1.0)
Monocytes Relative: 13 %
Neutro Abs: 8 K/uL — ABNORMAL HIGH (ref 1.7–7.7)
Neutrophils Relative %: 73 %
Platelet Count: 416 K/uL — ABNORMAL HIGH (ref 150–400)
RBC: 5.26 MIL/uL — ABNORMAL HIGH (ref 3.87–5.11)
RDW: 18 % — ABNORMAL HIGH (ref 11.5–15.5)
WBC Count: 11 K/uL — ABNORMAL HIGH (ref 4.0–10.5)
nRBC: 0 % (ref 0.0–0.2)

## 2023-10-29 LAB — CMP (CANCER CENTER ONLY)
ALT: 26 U/L (ref 0–44)
AST: 34 U/L (ref 15–41)
Albumin: 3.9 g/dL (ref 3.5–5.0)
Alkaline Phosphatase: 128 U/L — ABNORMAL HIGH (ref 38–126)
Anion gap: 11 (ref 5–15)
BUN: 21 mg/dL (ref 8–23)
CO2: 24 mmol/L (ref 22–32)
Calcium: 9 mg/dL (ref 8.9–10.3)
Chloride: 98 mmol/L (ref 98–111)
Creatinine: 1.13 mg/dL — ABNORMAL HIGH (ref 0.44–1.00)
GFR, Estimated: 49 mL/min — ABNORMAL LOW
Glucose, Bld: 104 mg/dL — ABNORMAL HIGH (ref 70–99)
Potassium: 4.2 mmol/L (ref 3.5–5.1)
Sodium: 133 mmol/L — ABNORMAL LOW (ref 135–145)
Total Bilirubin: 1 mg/dL (ref 0.0–1.2)
Total Protein: 7.7 g/dL (ref 6.5–8.1)

## 2023-10-29 LAB — RAD ONC ARIA SESSION SUMMARY
Course Elapsed Days: 0
Plan Fractions Treated to Date: 1
Plan Prescribed Dose Per Fraction: 6 Gy
Plan Total Fractions Prescribed: 5
Plan Total Prescribed Dose: 30 Gy
Reference Point Dosage Given to Date: 6 Gy
Reference Point Session Dosage Given: 6 Gy
Session Number: 1

## 2023-10-29 LAB — MAGNESIUM: Magnesium: 2.2 mg/dL (ref 1.7–2.4)

## 2023-10-29 MED ORDER — HEPARIN SOD (PORK) LOCK FLUSH 100 UNIT/ML IV SOLN
500.0000 [IU] | Freq: Once | INTRAVENOUS | Status: AC
  Administered 2023-10-29: 500 [IU] via INTRAVENOUS
  Filled 2023-10-29: qty 5

## 2023-10-29 MED ORDER — SODIUM CHLORIDE 0.9% FLUSH
10.0000 mL | INTRAVENOUS | Status: DC | PRN
  Administered 2023-10-29: 10 mL via INTRAVENOUS
  Filled 2023-10-29: qty 10

## 2023-11-02 ENCOUNTER — Encounter (INDEPENDENT_AMBULATORY_CARE_PROVIDER_SITE_OTHER): Payer: Self-pay

## 2023-11-02 ENCOUNTER — Other Ambulatory Visit: Payer: Self-pay

## 2023-11-02 ENCOUNTER — Ambulatory Visit
Admission: RE | Admit: 2023-11-02 | Discharge: 2023-11-02 | Disposition: A | Discharge status: Home / Self Care | Source: Ambulatory Visit | Attending: Radiation Oncology | Admitting: Radiation Oncology

## 2023-11-02 DIAGNOSIS — Z510 Encounter for antineoplastic radiation therapy: Secondary | ICD-10-CM | POA: Diagnosis not present

## 2023-11-02 LAB — RAD ONC ARIA SESSION SUMMARY
Course Elapsed Days: 4
Plan Fractions Treated to Date: 2
Plan Prescribed Dose Per Fraction: 6 Gy
Plan Total Fractions Prescribed: 5
Plan Total Prescribed Dose: 30 Gy
Reference Point Dosage Given to Date: 12 Gy
Reference Point Session Dosage Given: 6 Gy
Session Number: 2

## 2023-11-03 ENCOUNTER — Other Ambulatory Visit: Payer: Self-pay

## 2023-11-03 ENCOUNTER — Other Ambulatory Visit: Payer: Self-pay | Admitting: Pharmacist

## 2023-11-03 DIAGNOSIS — C3492 Malignant neoplasm of unspecified part of left bronchus or lung: Secondary | ICD-10-CM

## 2023-11-03 MED ORDER — KRAZATI 200 MG PO TABS
600.0000 mg | ORAL_TABLET | Freq: Two times a day (BID) | ORAL | 2 refills | Status: DC
  Filled 2023-11-03: qty 180, 30d supply, fill #0
  Filled 2023-11-30 – 2023-12-10 (×3): qty 180, 30d supply, fill #1
  Filled 2024-01-05 – 2024-01-07 (×2): qty 180, 30d supply, fill #2

## 2023-11-03 NOTE — Progress Notes (Signed)
 Specialty Pharmacy Refill Coordination Note  Hannah Yu is a 78 y.o. female contacted today regarding refills of specialty medication(s) Adagrasib  (Krazati )   Patient requested (Patient-Rptd) Pickup at [facility] at [facility] date: (Patient-Rptd) 11/10/23   Medication will be filled on 11/09/23.

## 2023-11-04 ENCOUNTER — Other Ambulatory Visit: Payer: Self-pay

## 2023-11-04 ENCOUNTER — Ambulatory Visit
Admission: RE | Admit: 2023-11-04 | Discharge: 2023-11-04 | Disposition: A | Discharge status: Home / Self Care | Source: Ambulatory Visit | Attending: Radiation Oncology | Admitting: Radiation Oncology

## 2023-11-04 DIAGNOSIS — C7931 Secondary malignant neoplasm of brain: Secondary | ICD-10-CM | POA: Insufficient documentation

## 2023-11-04 DIAGNOSIS — Z510 Encounter for antineoplastic radiation therapy: Secondary | ICD-10-CM | POA: Diagnosis not present

## 2023-11-04 DIAGNOSIS — C3492 Malignant neoplasm of unspecified part of left bronchus or lung: Secondary | ICD-10-CM | POA: Insufficient documentation

## 2023-11-04 DIAGNOSIS — D72829 Elevated white blood cell count, unspecified: Secondary | ICD-10-CM | POA: Insufficient documentation

## 2023-11-04 DIAGNOSIS — C7951 Secondary malignant neoplasm of bone: Secondary | ICD-10-CM | POA: Diagnosis not present

## 2023-11-04 LAB — RAD ONC ARIA SESSION SUMMARY
Course Elapsed Days: 6
Plan Fractions Treated to Date: 3
Plan Prescribed Dose Per Fraction: 6 Gy
Plan Total Fractions Prescribed: 5
Plan Total Prescribed Dose: 30 Gy
Reference Point Dosage Given to Date: 18 Gy
Reference Point Session Dosage Given: 6 Gy
Session Number: 3

## 2023-11-05 ENCOUNTER — Other Ambulatory Visit: Payer: Self-pay

## 2023-11-05 NOTE — Progress Notes (Signed)
 Specialty Pharmacy Ongoing Clinical Assessment Note  Hannah Yu is a 78 y.o. female who is being followed by the specialty pharmacy service for RxSp Oncology   Patient's specialty medication(s) reviewed today: Adagrasib  (Krazati )   Missed doses in the last 4 weeks: 0   Patient/Caregiver did not have any additional questions or concerns.   Therapeutic benefit summary: Patient is achieving benefit   Adverse events/side effects summary: Experienced adverse events/side effects (Diarrhea, able to control with lomotil  prn)   Patient's therapy is appropriate to: Continue    Goals Addressed             This Visit's Progress    Slow Disease Progression   On track    Patient is on track. Patient will maintain adherence. Per visit on 6/12, pt had scan on 6/4 that showed overall reduction in diseased burden and no new evidence of progressive or metastatic disease.          Follow up: 3 months  [facility]

## 2023-11-09 ENCOUNTER — Other Ambulatory Visit: Payer: Self-pay | Admitting: Oncology

## 2023-11-09 ENCOUNTER — Other Ambulatory Visit: Payer: Self-pay

## 2023-11-09 ENCOUNTER — Ambulatory Visit
Admission: RE | Admit: 2023-11-09 | Discharge: 2023-11-09 | Disposition: A | Discharge status: Home / Self Care | Source: Ambulatory Visit | Attending: Radiation Oncology | Admitting: Radiation Oncology

## 2023-11-09 DIAGNOSIS — Z510 Encounter for antineoplastic radiation therapy: Secondary | ICD-10-CM | POA: Diagnosis not present

## 2023-11-09 LAB — RAD ONC ARIA SESSION SUMMARY
Course Elapsed Days: 11
Plan Fractions Treated to Date: 4
Plan Prescribed Dose Per Fraction: 6 Gy
Plan Total Fractions Prescribed: 5
Plan Total Prescribed Dose: 30 Gy
Reference Point Dosage Given to Date: 24 Gy
Reference Point Session Dosage Given: 6 Gy
Session Number: 4

## 2023-11-11 ENCOUNTER — Other Ambulatory Visit: Payer: Self-pay

## 2023-11-11 ENCOUNTER — Other Ambulatory Visit: Payer: Self-pay | Admitting: *Deleted

## 2023-11-11 ENCOUNTER — Ambulatory Visit
Admission: RE | Admit: 2023-11-11 | Discharge: 2023-11-11 | Disposition: A | Discharge status: Home / Self Care | Source: Ambulatory Visit | Attending: Radiation Oncology | Admitting: Radiation Oncology

## 2023-11-11 DIAGNOSIS — Z510 Encounter for antineoplastic radiation therapy: Secondary | ICD-10-CM | POA: Diagnosis not present

## 2023-11-11 DIAGNOSIS — C3492 Malignant neoplasm of unspecified part of left bronchus or lung: Secondary | ICD-10-CM

## 2023-11-11 LAB — RAD ONC ARIA SESSION SUMMARY
Course Elapsed Days: 13
Plan Fractions Treated to Date: 5
Plan Prescribed Dose Per Fraction: 6 Gy
Plan Total Fractions Prescribed: 5
Plan Total Prescribed Dose: 30 Gy
Reference Point Dosage Given to Date: 30 Gy
Reference Point Session Dosage Given: 6 Gy
Session Number: 5

## 2023-11-12 ENCOUNTER — Ambulatory Visit: Admitting: Pharmacist

## 2023-11-12 ENCOUNTER — Encounter: Payer: Self-pay | Admitting: Oncology

## 2023-11-12 ENCOUNTER — Inpatient Hospital Stay (HOSPITAL_BASED_OUTPATIENT_CLINIC_OR_DEPARTMENT_OTHER): Admitting: Oncology

## 2023-11-12 ENCOUNTER — Inpatient Hospital Stay: Attending: Oncology

## 2023-11-12 VITALS — BP 132/62 | HR 75 | Temp 98.6°F | Resp 19 | Wt 177.0 lb

## 2023-11-12 DIAGNOSIS — C7931 Secondary malignant neoplasm of brain: Secondary | ICD-10-CM | POA: Diagnosis present

## 2023-11-12 DIAGNOSIS — C3492 Malignant neoplasm of unspecified part of left bronchus or lung: Secondary | ICD-10-CM

## 2023-11-12 DIAGNOSIS — Z79899 Other long term (current) drug therapy: Secondary | ICD-10-CM | POA: Diagnosis not present

## 2023-11-12 DIAGNOSIS — Z923 Personal history of irradiation: Secondary | ICD-10-CM | POA: Insufficient documentation

## 2023-11-12 DIAGNOSIS — N289 Disorder of kidney and ureter, unspecified: Secondary | ICD-10-CM | POA: Insufficient documentation

## 2023-11-12 DIAGNOSIS — Z803 Family history of malignant neoplasm of breast: Secondary | ICD-10-CM | POA: Diagnosis not present

## 2023-11-12 DIAGNOSIS — Z87891 Personal history of nicotine dependence: Secondary | ICD-10-CM | POA: Insufficient documentation

## 2023-11-12 DIAGNOSIS — C3432 Malignant neoplasm of lower lobe, left bronchus or lung: Secondary | ICD-10-CM | POA: Diagnosis present

## 2023-11-12 DIAGNOSIS — D75839 Thrombocytosis, unspecified: Secondary | ICD-10-CM | POA: Insufficient documentation

## 2023-11-12 DIAGNOSIS — E871 Hypo-osmolality and hyponatremia: Secondary | ICD-10-CM | POA: Diagnosis not present

## 2023-11-12 LAB — CMP (CANCER CENTER ONLY)
ALT: 22 U/L (ref 0–44)
AST: 29 U/L (ref 15–41)
Albumin: 4 g/dL (ref 3.5–5.0)
Alkaline Phosphatase: 134 U/L — ABNORMAL HIGH (ref 38–126)
Anion gap: 9 (ref 5–15)
BUN: 26 mg/dL — ABNORMAL HIGH (ref 8–23)
CO2: 23 mmol/L (ref 22–32)
Calcium: 9.1 mg/dL (ref 8.9–10.3)
Chloride: 95 mmol/L — ABNORMAL LOW (ref 98–111)
Creatinine: 1.27 mg/dL — ABNORMAL HIGH (ref 0.44–1.00)
GFR, Estimated: 43 mL/min — ABNORMAL LOW (ref >60)
Glucose, Bld: 122 mg/dL — ABNORMAL HIGH (ref 70–99)
Potassium: 4.2 mmol/L (ref 3.5–5.1)
Sodium: 127 mmol/L — ABNORMAL LOW (ref 135–145)
Total Bilirubin: 0.8 mg/dL (ref 0.0–1.2)
Total Protein: 7.6 g/dL (ref 6.5–8.1)

## 2023-11-12 LAB — CBC WITH DIFFERENTIAL/PLATELET
Abs Immature Granulocytes: 0.11 K/uL — ABNORMAL HIGH (ref 0.00–0.07)
Basophils Absolute: 0.1 K/uL (ref 0.0–0.1)
Basophils Relative: 1 %
Eosinophils Absolute: 0.1 K/uL (ref 0.0–0.5)
Eosinophils Relative: 1 %
HCT: 39.1 % (ref 36.0–46.0)
Hemoglobin: 12.3 g/dL (ref 12.0–15.0)
Immature Granulocytes: 1 %
Lymphocytes Relative: 9 %
Lymphs Abs: 0.9 K/uL (ref 0.7–4.0)
MCH: 22.9 pg — ABNORMAL LOW (ref 26.0–34.0)
MCHC: 31.5 g/dL (ref 30.0–36.0)
MCV: 72.9 fL — ABNORMAL LOW (ref 80.0–100.0)
Monocytes Absolute: 1 K/uL (ref 0.1–1.0)
Monocytes Relative: 10 %
Neutro Abs: 7.6 K/uL (ref 1.7–7.7)
Neutrophils Relative %: 78 %
Platelets: 424 K/uL — ABNORMAL HIGH (ref 150–400)
RBC: 5.36 MIL/uL — ABNORMAL HIGH (ref 3.87–5.11)
RDW: 17.7 % — ABNORMAL HIGH (ref 11.5–15.5)
WBC: 9.7 K/uL (ref 4.0–10.5)
nRBC: 0 % (ref 0.0–0.2)

## 2023-11-12 LAB — MAGNESIUM: Magnesium: 2.2 mg/dL (ref 1.7–2.4)

## 2023-11-12 NOTE — Progress Notes (Signed)
 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  INTERVAL HISTORY: Patient returns to clinic today for further evaluation and continuation of Adagrasib .  She continues to tolerate her treatment well without significant side effects.  She currently feels well and is asymptomatic. She has no neurologic complaints.  She denies any recent fevers.  She has a good appetite and denies weight loss.  She denies any chest pain, shortness of breath, cough, or hemoptysis.  She denies any nausea, vomiting, constipation, or diarrhea.  She has no urinary complaints.  Patient offers no specific complaints today.  REVIEW OF SYSTEMS:   Review of Systems  Constitutional: Negative.  Negative for fever, malaise/fatigue and weight loss.  Respiratory: Negative.  Negative for cough, hemoptysis and shortness of breath.   Cardiovascular: Negative.  Negative for chest pain and leg swelling.  Gastrointestinal: Negative.  Negative for abdominal pain.  Genitourinary: Negative.  Negative for dysuria.  Musculoskeletal: Negative.  Negative for back pain and joint pain.  Skin: Negative.  Negative for rash.  Neurological: Negative.  Negative for dizziness, focal weakness, weakness and headaches.  Psychiatric/Behavioral: Negative.  The patient is not nervous/anxious.     As per HPI. Otherwise, a complete review of systems is negative.  PAST MEDICAL HISTORY: Past Medical History:  Diagnosis Date   Acid reflux    Anemia    Anesthesia complication    Tachycardia  previously, now on metoprolol    Arthritis    09/02/2019: per patient have it real bad in both hands and back   Difficult intubation    had to terminate intubation unable to advance tube for cholecystectomy 2017?   DVT (deep venous thrombosis) (HCC)    leg   Hypertension    Sciatica    09/02/2019: has had it for about 3 years    PAST SURGICAL HISTORY: Past Surgical History:  Procedure Laterality Date   ABDOMINAL HYSTERECTOMY     CATARACT EXTRACTION W/ INTRAOCULAR LENS IMPLANT Bilateral 2007   eyes done within 1 month of each other.   IR IMAGING GUIDED PORT INSERTION  05/19/2023   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   TOTAL HIP ARTHROPLASTY Right 09/06/2019   Procedure: RIGHT TOTAL HIP ARTHROPLASTY ANTERIOR APPROACH;  Surgeon: Vernetta Lonni GRADE, MD;  Location: [facility] OR;  Service: Orthopedics;  Laterality: Right;   TOTAL HIP ARTHROPLASTY Left 07/31/2020   Procedure: LEFT TOTAL HIP ARTHROPLASTY ANTERIOR APPROACH;  Surgeon: Vernetta Lonni GRADE, MD;  Location: [facility] OR;  Service: Orthopedics;  Laterality: Left;   VIDEO BRONCHOSCOPY WITH ENDOBRONCHIAL ULTRASOUND N/A 10/06/2022   Procedure: VIDEO BRONCHOSCOPY WITH ENDOBRONCHIAL ULTRASOUND;  Surgeon: Parris Manna, MD;  Location: [facility] ORS;  Service: Thoracic;  Laterality: N/A;    FAMILY HISTORY: Family History  Problem Relation Age of Onset   Breast cancer Sister 50    ADVANCED DIRECTIVES (Y/N):  N  HEALTH MAINTENANCE: Social History   Tobacco Use   Smoking status: Former    Current packs/day: 0.00    Types: Cigarettes    Quit date: 01/02/1993    Years since  quitting: 30.8   Smokeless tobacco: Never  Vaping Use   Vaping status: Never Used  Substance Use Topics   Alcohol use: Yes    Alcohol/week: 4.0 standard drinks of alcohol    Types: 4 Glasses of wine per week    Comment: 09/02/2019: per patient ever so often   Drug use: No      Colonoscopy:  PAP:  Bone density:  Lipid panel:  Allergies  Allergen Reactions   Ezetimibe Other (See Comments)    Body aches, and stiffness   Codeine Diarrhea   Cortisone Swelling    injections   Lovastatin Other (See Comments)    Muscle cramps    Current Outpatient Medications  Medication Sig Dispense Refill   adagrasib  (KRAZATI ) 200 MG tablet Take 3 tablets (600 mg total) by mouth 2 (two) times daily. 180 tablet 2   aspirin  EC 81 MG tablet Take 162 mg by mouth daily. Swallow whole.     Biotin 10 MG CAPS      cholecalciferol  (VITAMIN D3) 25 MCG (1000 UNIT) tablet Take 1,000 Units by mouth daily.     diphenoxylate -atropine  (LOMOTIL ) 2.5-0.025 MG tablet Take 1 tablet by mouth 4 (four) times daily as needed for diarrhea or loose stools. 60 tablet 2   FIBER PO Take 1 tablet by mouth daily.     furosemide  (LASIX ) 40 MG tablet Take 40 mg by mouth daily. 40 mg alternating 80 mg every other day     ibuprofen (ADVIL) 200 MG tablet Take 400 mg by mouth every 6 (six) hours as needed for mild pain or moderate pain.     Ibuprofen-diphenhydrAMINE  HCl (IBUPROFEN PM) 200-25 MG CAPS Take 2 tablets by mouth at bedtime as needed (sleep).     LORazepam (ATIVAN) 0.5 MG tablet Take by mouth.     losartan  (COZAAR ) 100 MG tablet Take 100 mg by mouth daily.     magnesium oxide (MAG-OX) 400 (240 Mg) MG tablet Take 400 mg by mouth daily.     metoprolol  succinate (TOPROL -XL) 50 MG 24 hr tablet Take 50 mg by mouth daily.     omeprazole (PRILOSEC) 40 MG capsule Take 40 mg by mouth daily as needed.     OVER THE COUNTER MEDICATION Take 1 capsule by mouth daily. CBD gummie     prochlorperazine  (COMPAZINE ) 10 MG tablet TAKE ONE TABLET BY MOUTH EVERY 6 HOURS AS NEEDED FOR NAUSEA / VOMITING 30 tablet 2   Tetrahydrozoline HCl (VISINE OP) Place 1 drop into both eyes daily as needed (dry eyes).     traZODone (DESYREL) 50 MG tablet Take 1 tablet by mouth at bedtime.     vitamin B-12 (CYANOCOBALAMIN ) 1000 MCG  tablet Take 1,000 mcg by mouth daily.     ondansetron  (ZOFRAN ) 8 MG tablet Take 1 tablet (8 mg total) by mouth every 8 (eight) hours as needed for nausea or vomiting. (Patient not taking: Reported on 11/12/2023) 20 tablet 0   No current facility-administered medications for this visit.    OBJECTIVE: Vitals:   11/12/23 1007  BP: 132/62  Pulse: 75  Resp: 19  Temp: 98.6 F (37 C)  SpO2: 98%      Body mass index is 34.57 kg/m.    ECOG FS:0 - Asymptomatic  General: Well-developed, well-nourished, no acute distress. Eyes: Pink conjunctiva, anicteric sclera. HEENT: Normocephalic, moist mucous membranes. Lungs: No audible wheezing or coughing. Heart: Regular rate and rhythm. Abdomen: Soft, nontender, no obvious distention. Musculoskeletal: No edema, cyanosis, or clubbing. Neuro:  Alert, answering all questions appropriately. Cranial nerves grossly intact. Skin: No rashes or petechiae noted. Psych: Normal affect.  LAB RESULTS:  Lab Results  Component Value Date   NA 127 (L) 11/12/2023   K 4.2 11/12/2023   CL 95 (L) 11/12/2023   CO2 23 11/12/2023   GLUCOSE 122 (H) 11/12/2023   BUN 26 (H) 11/12/2023   CREATININE 1.27 (H) 11/12/2023   CALCIUM 9.1 11/12/2023   PROT 7.6 11/12/2023   ALBUMIN  4.0 11/12/2023   AST 29 11/12/2023   ALT 22 11/12/2023   ALKPHOS 134 (H) 11/12/2023   BILITOT 0.8 11/12/2023   GFRNONAA 43 (L) 11/12/2023   GFRAA >60 09/10/2019    Lab Results  Component Value Date   WBC 9.7 11/12/2023   NEUTROABS 7.6 11/12/2023   HGB 12.3 11/12/2023   HCT 39.1 11/12/2023   MCV 72.9 (L) 11/12/2023   PLT 424 (H) 11/12/2023     STUDIES: No results found.  ONCOLOGY HISTORY: Patient completed SBRT to the 2.7 cm irregular left lower lobe nodule in July 2024.  PET scan results from February 13, 2023 reviewed independently with residual low level of activity suggesting treatment response in the left lower lobe.  The AP window lymph node seen on subsequent CT scan revealed  progressive enlargement now measuring 1.7 x 1.3 cm as well as increased hypermetabolic activity.  Patient completed XRT to this lesion.  Patient has also completed XRT to her left pelvis.    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  Patient initiated Keytruda  on May 07, 2023.  PET scan on July 08, 2023 revealed progressive disease and treatment was changed to Adagrasib  (Krazati ) 600 mg twice daily given her K-ras mutation.  MRI of the brain on August 22, 2023 revealed a 1 cm left lateral cerebellum lesion.  Patient has completed XRT to this lesion.  Repeat PET scan results from October 07, 2023 reviewed independently with overall reduction in disease burden with no new evidence of progressive or metastatic disease. Continue adagrasib  as prescribed.  Return to clinic in 4 weeks for repeat laboratory work and further evaluation.  Appreciate clinical pharmacy input.   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   Renal insufficiency: Creatinine improved to 1.27.  Continue increased fluid intake. Hyponatremia: Sodium is trended down slightly to 127.  Monitor. Leukocytosis: Resolved. Thrombocytosis: Likely reactive, monitor. Shortness of breath/cough: Patient does not complain of this today.    Patient expressed understanding and was in agreement with this plan. She also understands that She can call clinic at any time with any questions, concerns, or complaints.    Cancer Staging  Adenocarcinoma of left lung [facility]) Staging form: Lung, AJCC 8th Edition - Clinical stage from 03/19/2023: Stage IVB (cTX, cN2, cM1c) - Signed by Jacobo Evalene PARAS, MD on 03/19/2023 Stage prefix: Initial diagnosis   Evalene PARAS Jacobo, MD   11/12/2023 10:50 AM

## 2023-11-13 NOTE — Radiation Completion Notes (Signed)
 Patient Name: Hannah Yu, Hannah Yu MRN: [id number] Date of Birth: 10/31/1945 Referring Physician: ALM NEEDLE, M.D. Date of Service: 2023-11-13 Radiation Oncologist: Marcey Penton, M.D. 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  She has had multiple courses of treatment in the past.  She is doing fairly well specifically denies any pain except for some occasional pain on rotation of her left upper extremity.  She specifically [place] cough hemoptysis or chest tightness that she is currently on Adagrasib  .  She does have PD-L1 5% and K-ras G12C mutated.  She had a recent PET CT scan yesterday which I have reviewed again shows progression of disease in the left acromion which has increased in hypermetabolic activity and destruction over the last PET CT scan back in March.  She is having no neurologic complaints has a stable neurologic exam.      ==========DELIVERED PLANS==========  First Treatment Date: 2023-10-29 Last Treatment Date: 2023-11-11   Plan Name: Chest_L_SBRT Site: Scapula, Left Technique: SBRT/SRT-IMRT Mode: Photon Dose Per Fraction: 6 Gy Prescribed Dose (Delivered / Prescribed): 30 Gy / 30 Gy Prescribed Fxs (Delivered / Prescribed): 5 / 5     ==========ON TREATMENT VISIT DATES========== 2023-10-29, 2023-11-02, 2023-11-04, 2023-11-04, 2023-11-09, 2023-11-11     ==========UPCOMING VISITS==========       ==========APPENDIX - ON TREATMENT VISIT NOTES==========   See weekly On Treatment Notes in Epic for details in the Media tab (listed as Progress notes on the On Treatment Visit Dates listed above).

## 2023-11-25 ENCOUNTER — Other Ambulatory Visit: Payer: Self-pay

## 2023-11-25 DIAGNOSIS — C3492 Malignant neoplasm of unspecified part of left bronchus or lung: Secondary | ICD-10-CM

## 2023-11-26 ENCOUNTER — Inpatient Hospital Stay

## 2023-11-26 DIAGNOSIS — C3492 Malignant neoplasm of unspecified part of left bronchus or lung: Secondary | ICD-10-CM

## 2023-11-26 DIAGNOSIS — C7931 Secondary malignant neoplasm of brain: Secondary | ICD-10-CM | POA: Diagnosis not present

## 2023-11-26 LAB — CMP (CANCER CENTER ONLY)
ALT: 23 U/L (ref 0–44)
AST: 27 U/L (ref 15–41)
Albumin: 4.1 g/dL (ref 3.5–5.0)
Alkaline Phosphatase: 136 U/L — ABNORMAL HIGH (ref 38–126)
Anion gap: 8 (ref 5–15)
BUN: 33 mg/dL — ABNORMAL HIGH (ref 8–23)
CO2: 23 mmol/L (ref 22–32)
Calcium: 9 mg/dL (ref 8.9–10.3)
Chloride: 96 mmol/L — ABNORMAL LOW (ref 98–111)
Creatinine: 1.3 mg/dL — ABNORMAL HIGH (ref 0.44–1.00)
GFR, Estimated: 42 mL/min — ABNORMAL LOW (ref >60)
Glucose, Bld: 103 mg/dL — ABNORMAL HIGH (ref 70–99)
Potassium: 4.4 mmol/L (ref 3.5–5.1)
Sodium: 127 mmol/L — ABNORMAL LOW (ref 135–145)
Total Bilirubin: 0.8 mg/dL (ref 0.0–1.2)
Total Protein: 7.5 g/dL (ref 6.5–8.1)

## 2023-11-26 LAB — CBC WITH DIFFERENTIAL/PLATELET
Abs Immature Granulocytes: 0.12 K/uL — ABNORMAL HIGH (ref 0.00–0.07)
Basophils Absolute: 0.1 K/uL (ref 0.0–0.1)
Basophils Relative: 1 %
Eosinophils Absolute: 0.3 K/uL (ref 0.0–0.5)
Eosinophils Relative: 3 %
HCT: 38.5 % (ref 36.0–46.0)
Hemoglobin: 12 g/dL (ref 12.0–15.0)
Immature Granulocytes: 1 %
Lymphocytes Relative: 10 %
Lymphs Abs: 1.1 K/uL (ref 0.7–4.0)
MCH: 22.6 pg — ABNORMAL LOW (ref 26.0–34.0)
MCHC: 31.2 g/dL (ref 30.0–36.0)
MCV: 72.4 fL — ABNORMAL LOW (ref 80.0–100.0)
Monocytes Absolute: 1.4 K/uL — ABNORMAL HIGH (ref 0.1–1.0)
Monocytes Relative: 13 %
Neutro Abs: 8 K/uL — ABNORMAL HIGH (ref 1.7–7.7)
Neutrophils Relative %: 72 %
Platelets: 378 K/uL (ref 150–400)
RBC: 5.32 MIL/uL — ABNORMAL HIGH (ref 3.87–5.11)
RDW: 18.5 % — ABNORMAL HIGH (ref 11.5–15.5)
WBC: 10.9 K/uL — ABNORMAL HIGH (ref 4.0–10.5)
nRBC: 0 % (ref 0.0–0.2)

## 2023-11-26 LAB — MAGNESIUM: Magnesium: 2.1 mg/dL (ref 1.7–2.4)

## 2023-11-30 ENCOUNTER — Other Ambulatory Visit: Payer: Self-pay

## 2023-12-02 ENCOUNTER — Other Ambulatory Visit: Payer: Self-pay

## 2023-12-03 ENCOUNTER — Other Ambulatory Visit: Payer: Self-pay

## 2023-12-08 ENCOUNTER — Other Ambulatory Visit: Payer: Self-pay | Admitting: Oncology

## 2023-12-09 ENCOUNTER — Other Ambulatory Visit: Payer: Self-pay

## 2023-12-09 DIAGNOSIS — C3492 Malignant neoplasm of unspecified part of left bronchus or lung: Secondary | ICD-10-CM

## 2023-12-10 ENCOUNTER — Inpatient Hospital Stay (HOSPITAL_BASED_OUTPATIENT_CLINIC_OR_DEPARTMENT_OTHER): Admitting: Oncology

## 2023-12-10 ENCOUNTER — Other Ambulatory Visit (HOSPITAL_COMMUNITY): Payer: Self-pay

## 2023-12-10 ENCOUNTER — Other Ambulatory Visit: Payer: Self-pay

## 2023-12-10 ENCOUNTER — Encounter: Payer: Self-pay | Admitting: Oncology

## 2023-12-10 ENCOUNTER — Inpatient Hospital Stay: Admitting: Pharmacist

## 2023-12-10 ENCOUNTER — Inpatient Hospital Stay: Attending: Oncology

## 2023-12-10 VITALS — BP 182/64 | HR 68 | Temp 97.5°F | Resp 16 | Wt 176.0 lb

## 2023-12-10 DIAGNOSIS — Z87891 Personal history of nicotine dependence: Secondary | ICD-10-CM | POA: Diagnosis not present

## 2023-12-10 DIAGNOSIS — D75839 Thrombocytosis, unspecified: Secondary | ICD-10-CM | POA: Diagnosis not present

## 2023-12-10 DIAGNOSIS — N289 Disorder of kidney and ureter, unspecified: Secondary | ICD-10-CM | POA: Diagnosis not present

## 2023-12-10 DIAGNOSIS — C3432 Malignant neoplasm of lower lobe, left bronchus or lung: Secondary | ICD-10-CM | POA: Diagnosis present

## 2023-12-10 DIAGNOSIS — C3492 Malignant neoplasm of unspecified part of left bronchus or lung: Secondary | ICD-10-CM

## 2023-12-10 DIAGNOSIS — Z79899 Other long term (current) drug therapy: Secondary | ICD-10-CM | POA: Diagnosis not present

## 2023-12-10 DIAGNOSIS — I10 Essential (primary) hypertension: Secondary | ICD-10-CM | POA: Insufficient documentation

## 2023-12-10 DIAGNOSIS — C7931 Secondary malignant neoplasm of brain: Secondary | ICD-10-CM | POA: Diagnosis present

## 2023-12-10 DIAGNOSIS — E871 Hypo-osmolality and hyponatremia: Secondary | ICD-10-CM | POA: Insufficient documentation

## 2023-12-10 DIAGNOSIS — Z803 Family history of malignant neoplasm of breast: Secondary | ICD-10-CM | POA: Diagnosis not present

## 2023-12-10 DIAGNOSIS — Z923 Personal history of irradiation: Secondary | ICD-10-CM | POA: Diagnosis not present

## 2023-12-10 LAB — CMP (CANCER CENTER ONLY)
ALT: 23 U/L (ref 0–44)
AST: 30 U/L (ref 15–41)
Albumin: 3.9 g/dL (ref 3.5–5.0)
Alkaline Phosphatase: 140 U/L — ABNORMAL HIGH (ref 38–126)
Anion gap: 9 (ref 5–15)
BUN: 29 mg/dL — ABNORMAL HIGH (ref 8–23)
CO2: 22 mmol/L (ref 22–32)
Calcium: 9.2 mg/dL (ref 8.9–10.3)
Chloride: 98 mmol/L (ref 98–111)
Creatinine: 1.4 mg/dL — ABNORMAL HIGH (ref 0.44–1.00)
GFR, Estimated: 38 mL/min — ABNORMAL LOW (ref >60)
Glucose, Bld: 105 mg/dL — ABNORMAL HIGH (ref 70–99)
Potassium: 4.4 mmol/L (ref 3.5–5.1)
Sodium: 129 mmol/L — ABNORMAL LOW (ref 135–145)
Total Bilirubin: 0.9 mg/dL (ref 0.0–1.2)
Total Protein: 7.7 g/dL (ref 6.5–8.1)

## 2023-12-10 LAB — CBC WITH DIFFERENTIAL (CANCER CENTER ONLY)
Abs Immature Granulocytes: 0.13 K/uL — ABNORMAL HIGH (ref 0.00–0.07)
Basophils Absolute: 0.1 K/uL (ref 0.0–0.1)
Basophils Relative: 1 %
Eosinophils Absolute: 0.3 K/uL (ref 0.0–0.5)
Eosinophils Relative: 3 %
HCT: 37.5 % (ref 36.0–46.0)
Hemoglobin: 12 g/dL (ref 12.0–15.0)
Immature Granulocytes: 1 %
Lymphocytes Relative: 12 %
Lymphs Abs: 1.1 K/uL (ref 0.7–4.0)
MCH: 22.7 pg — ABNORMAL LOW (ref 26.0–34.0)
MCHC: 32 g/dL (ref 30.0–36.0)
MCV: 71 fL — ABNORMAL LOW (ref 80.0–100.0)
Monocytes Absolute: 1.3 K/uL — ABNORMAL HIGH (ref 0.1–1.0)
Monocytes Relative: 13 %
Neutro Abs: 6.8 K/uL (ref 1.7–7.7)
Neutrophils Relative %: 70 %
Platelet Count: 447 K/uL — ABNORMAL HIGH (ref 150–400)
RBC: 5.28 MIL/uL — ABNORMAL HIGH (ref 3.87–5.11)
RDW: 18.6 % — ABNORMAL HIGH (ref 11.5–15.5)
WBC Count: 9.7 K/uL (ref 4.0–10.5)
nRBC: 0 % (ref 0.0–0.2)

## 2023-12-10 LAB — MAGNESIUM: Magnesium: 2.2 mg/dL (ref 1.7–2.4)

## 2023-12-10 NOTE — Progress Notes (Signed)
 Specialty Pharmacy Refill Coordination Note  Spoke with Hannah Yu  Hannah Yu is a 78 y.o. female contacted today regarding refills of specialty medication(s) Adagrasib  (Krazati )  Doses on hand: 42 tablets (7 days)   Patient requested: Pickup at [facility] at [place] date: 12/11/23  Medication will be filled on 12/10/23.

## 2023-12-10 NOTE — Progress Notes (Unsigned)
 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  INTERVAL HISTORY: Patient returns to clinic today for further evaluation, laboratory work, and continuation of Adagrasib .  She currently feels well and is asymptomatic.  She continues to tolerate her treatments without significant side effects.  She has no neurologic complaints.  She denies any recent fevers or illnesses. She has a good appetite and denies weight loss.  She denies any chest pain, shortness of breath, cough, or hemoptysis.  She denies any nausea, vomiting, constipation, or diarrhea.  She has no urinary complaints.  Patient offers no specific complaints today.  REVIEW OF SYSTEMS:   Review of Systems  Constitutional: Negative.  Negative for fever, malaise/fatigue and weight loss.  Respiratory: Negative.  Negative for cough, hemoptysis and shortness of breath.   Cardiovascular: Negative.  Negative for chest pain and leg swelling.  Gastrointestinal: Negative.  Negative for abdominal pain.  Genitourinary: Negative.  Negative for dysuria.  Musculoskeletal: Negative.  Negative for back pain and joint pain.  Skin: Negative.  Negative for rash.  Neurological: Negative.  Negative for dizziness, focal weakness, weakness and headaches.  Psychiatric/Behavioral: Negative.  The patient is not nervous/anxious.     As per HPI. Otherwise, a complete review of systems is negative.  PAST MEDICAL HISTORY: Past Medical History:  Diagnosis Date   Acid reflux    Anemia    Anesthesia  complication    Tachycardia previously, now on metoprolol    Arthritis    09/02/2019: per patient have it real bad in both hands and back   Difficult intubation    had to terminate intubation unable to advance tube for cholecystectomy 2017?   DVT (deep venous thrombosis) (HCC)    leg   Hypertension    Sciatica    09/02/2019: has had it for about 3 years    PAST SURGICAL HISTORY: Past Surgical History:  Procedure Laterality Date   ABDOMINAL HYSTERECTOMY     CATARACT EXTRACTION W/ INTRAOCULAR LENS IMPLANT Bilateral 2007   eyes done within 1 month of each other.   IR IMAGING GUIDED PORT INSERTION  05/19/2023   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   TOTAL HIP ARTHROPLASTY Right 09/06/2019   Procedure: RIGHT TOTAL HIP ARTHROPLASTY ANTERIOR APPROACH;  Surgeon: Vernetta Lonni GRADE, MD;  Location: [facility] OR;  Service: Orthopedics;  Laterality: Right;   TOTAL HIP ARTHROPLASTY Left 07/31/2020   Procedure: LEFT TOTAL HIP ARTHROPLASTY ANTERIOR APPROACH;  Surgeon: Vernetta Lonni GRADE, MD;  Location: [facility] OR;  Service: Orthopedics;  Laterality: Left;   VIDEO BRONCHOSCOPY WITH ENDOBRONCHIAL ULTRASOUND N/A 10/06/2022   Procedure: VIDEO BRONCHOSCOPY WITH ENDOBRONCHIAL ULTRASOUND;  Surgeon: Parris Manna, MD;  Location: [facility] ORS;  Service: Thoracic;  Laterality: N/A;    FAMILY HISTORY: Family History  Problem Relation Age of Onset   Breast cancer Sister 46    ADVANCED DIRECTIVES (Y/N):  N  HEALTH MAINTENANCE: Social History   Tobacco Use   Smoking status: Former    Current packs/day: 0.00    Types: Cigarettes    Quit date: 01/02/1993  Years since quitting: 30.9   Smokeless tobacco: Never  Vaping Use   Vaping status: Never Used  Substance Use Topics   Alcohol use: Yes    Alcohol/week: 4.0 standard drinks of alcohol    Types: 4 Glasses of wine per week    Comment: 09/02/2019: per patient ever so often   Drug use: No      Colonoscopy:  PAP:  Bone density:  Lipid panel:  Allergies  Allergen Reactions   Ezetimibe Other (See Comments)    Body aches, and stiffness   Codeine Diarrhea   Cortisone Swelling    injections   Lovastatin Other (See Comments)    Muscle cramps    Current Outpatient Medications  Medication Sig Dispense Refill   adagrasib  (KRAZATI ) 200 MG tablet Take 3 tablets (600 mg total) by mouth 2 (two) times daily. 180 tablet 2   aspirin  EC 81 MG tablet Take 162 mg by mouth daily. Swallow whole.     Biotin 10 MG CAPS      cholecalciferol  (VITAMIN D3) 25 MCG (1000 UNIT) tablet Take 1,000 Units by mouth daily.     diphenoxylate -atropine  (LOMOTIL ) 2.5-0.025 MG tablet Take 1 tablet by mouth 4 (four) times daily as needed for diarrhea or loose stools. 60 tablet 2   FIBER PO Take 1 tablet by mouth daily.     furosemide  (LASIX ) 40 MG tablet Take 40 mg by mouth daily. 40 mg alternating 80 mg every other day     ibuprofen (ADVIL) 200 MG tablet Take 400 mg by mouth every 6 (six) hours as needed for mild pain or moderate pain.     Ibuprofen-diphenhydrAMINE  HCl (IBUPROFEN PM) 200-25 MG CAPS Take 2 tablets by mouth at bedtime as needed (sleep).     LORazepam (ATIVAN) 0.5 MG tablet Take by mouth.     losartan  (COZAAR ) 100 MG tablet Take 100 mg by mouth daily.     magnesium oxide (MAG-OX) 400 (240 Mg) MG tablet Take 400 mg by mouth daily.     metoprolol  succinate (TOPROL -XL) 50 MG 24 hr tablet Take 50 mg by mouth daily.     omeprazole (PRILOSEC) 40 MG capsule Take 40 mg by mouth daily as needed.     OVER THE COUNTER MEDICATION Take 1 capsule by mouth daily. CBD gummie     prochlorperazine  (COMPAZINE ) 10 MG tablet TAKE ONE TABLET BY MOUTH EVERY 6 HOURS AS NEEDED FOR NAUSEA / VOMITING 30 tablet 2   Tetrahydrozoline HCl (VISINE OP) Place 1 drop into both eyes daily as needed (dry eyes).     traZODone (DESYREL) 50 MG tablet Take 1 tablet by mouth at bedtime.     vitamin B-12 (CYANOCOBALAMIN ) 1000 MCG  tablet Take 1,000 mcg by mouth daily.     ondansetron  (ZOFRAN ) 8 MG tablet Take 1 tablet (8 mg total) by mouth every 8 (eight) hours as needed for nausea or vomiting. (Patient not taking: Reported on 12/10/2023) 20 tablet 0   No current facility-administered medications for this visit.    OBJECTIVE: Vitals:   12/10/23 0945 12/10/23 0951  BP: (!) 182/67 (!) 182/64  Pulse: 68   Resp: 16   Temp: (!) 97.5 F (36.4 C)   SpO2: 94%       Body mass index is 34.37 kg/m.    ECOG FS:0 - Asymptomatic  General: Well-developed, well-nourished, no acute distress. Eyes: Pink conjunctiva, anicteric sclera. HEENT: Normocephalic, moist mucous membranes. Lungs: No audible wheezing or coughing. Heart: Regular rate and rhythm. Abdomen:  Soft, nontender, no obvious distention. Musculoskeletal: No edema, cyanosis, or clubbing. Neuro: Alert, answering all questions appropriately. Cranial nerves grossly intact. Skin: No rashes or petechiae noted. Psych: Normal affect.  LAB RESULTS:  Lab Results  Component Value Date   NA 129 (L) 12/10/2023   K 4.4 12/10/2023   CL 98 12/10/2023   CO2 22 12/10/2023   GLUCOSE 105 (H) 12/10/2023   BUN 29 (H) 12/10/2023   CREATININE 1.40 (H) 12/10/2023   CALCIUM 9.2 12/10/2023   PROT 7.7 12/10/2023   ALBUMIN  3.9 12/10/2023   AST 30 12/10/2023   ALT 23 12/10/2023   ALKPHOS 140 (H) 12/10/2023   BILITOT 0.9 12/10/2023   GFRNONAA 38 (L) 12/10/2023   GFRAA >60 09/10/2019    Lab Results  Component Value Date   WBC 9.7 12/10/2023   NEUTROABS 6.8 12/10/2023   HGB 12.0 12/10/2023   HCT 37.5 12/10/2023   MCV 71.0 (L) 12/10/2023   PLT 447 (H) 12/10/2023     STUDIES: No results found.  ONCOLOGY HISTORY: Patient completed SBRT to the 2.7 cm irregular left lower lobe nodule in July 2024.  PET scan results from February 13, 2023 reviewed independently with residual low level of activity suggesting treatment response in the left lower lobe.  The AP window lymph node  seen on subsequent CT scan revealed progressive enlargement now measuring 1.7 x 1.3 cm as well as increased hypermetabolic activity.  Patient completed XRT to this lesion.  Patient has also completed XRT to her left pelvis.    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  Patient initiated Keytruda  on May 07, 2023.  PET scan on July 08, 2023 revealed progressive disease and treatment was changed to Adagrasib  (Krazati ) 600 mg twice daily given her K-ras mutation.  MRI of the brain on August 22, 2023 revealed a 1 cm left lateral cerebellum lesion.  Patient has completed XRT to this lesion.  Repeat PET scan results from October 07, 2023 reviewed independently with overall reduction in disease burden with no new evidence of progressive or metastatic disease. Continue adagrasib  as prescribed.  Return to clinic in 4 weeks for repeat laboratory and further evaluation.  Will repeat PET scan prior to next clinic appointment.  Appreciate clinical pharmacy input. 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   Renal insufficiency: Creatinine is trended up slightly to 1.40.  Monitor.  Continue increased fluid intake. Hyponatremia: Chronic and unchanged.  Patient's sodium level is 129.   Leukocytosis: Resolved. Thrombocytosis: Chronic and unchanged.  Likely reactive. Shortness of breath/cough: Patient does not complain of this today. Hypertension: Patient's blood pressure is moderately elevated today.  Continue monitoring treatment per primary care.    Patient expressed understanding and was in agreement with this plan. She also understands that She can call clinic at any time with any questions, concerns, or complaints.    Cancer Staging  Adenocarcinoma of left lung [facility]) Staging form: Lung, AJCC 8th  Edition - Clinical stage from 03/19/2023: Stage IVB (cTX, cN2, cM1c) - Signed by Jacobo Evalene PARAS, MD on 03/19/2023 Stage prefix: Initial diagnosis   Evalene PARAS Jacobo, MD   12/10/2023 10:12 AM

## 2023-12-10 NOTE — Progress Notes (Signed)
 Clinical Pharmacist Practitioner Clinic [facility]  [phone] [phone]  Patient Care Team: Epifanio Alm SQUIBB, MD as PCP - General (Infectious Diseases) Verdene Gills, RN as Oncology Nurse Navigator Lenn Aran, MD as Consulting Physician (Radiation Oncology) Jacobo Evalene PARAS, MD as Consulting Physician (Oncology)   Name of the patient: Hannah Yu  [id number]  01-02-1946   Date of visit: 12/10/23  HPI: Patient is a 78 y.o. female with  progressive NSCLC, KRAS G12C mutation positive ([place] 03/3023). Patient started adagrasib  on 08/20/23.   Reason for Consult: Oral chemotherapy follow-up for adagrasib  therapy.   PAST MEDICAL HISTORY: Past Medical History:  Diagnosis Date   Acid reflux    Anemia    Anesthesia complication    Tachycardia previously, now on metoprolol    Arthritis    09/02/2019: per patient have it real bad in both hands and back   Difficult intubation    had to terminate intubation unable to advance tube for cholecystectomy 2017?   DVT (deep venous thrombosis) (HCC)    leg   Hypertension    Sciatica    09/02/2019: has had it for about 3 years    HEMATOLOGY/ONCOLOGY HISTORY:  Oncology History  Adenocarcinoma of left lung (HCC)  03/19/2023 Initial Diagnosis   Adenocarcinoma of left lung (HCC)   03/19/2023 Cancer Staging   Staging form: Lung, AJCC 8th Edition - Clinical stage from 03/19/2023: Stage IVB (cTX, cN2, cM1c) - Signed by Jacobo Evalene PARAS, MD on 03/19/2023 Stage prefix: Initial diagnosis   05/07/2023 - 07/16/2023 Chemotherapy   Patient is on Treatment Plan : LUNG NSCLC Pembrolizumab  (200) q21d       ALLERGIES:  is allergic to ezetimibe, codeine, cortisone, and lovastatin.  MEDICATIONS:  Current Outpatient Medications  Medication Sig Dispense Refill   adagrasib  (KRAZATI ) 200 MG tablet Take 3 tablets (600 mg total) by mouth 2 (two) times daily. 180 tablet 2   aspirin  EC 81 MG tablet  Take 162 mg by mouth daily. Swallow whole.     Biotin 10 MG CAPS      cholecalciferol  (VITAMIN D3) 25 MCG (1000 UNIT) tablet Take 1,000 Units by mouth daily.     diphenoxylate -atropine  (LOMOTIL ) 2.5-0.025 MG tablet Take 1 tablet by mouth 4 (four) times daily as needed for diarrhea or loose stools. 60 tablet 2   FIBER PO Take 1 tablet by mouth daily.     furosemide  (LASIX ) 40 MG tablet Take 40 mg by mouth daily. 40 mg alternating 80 mg every other day     ibuprofen (ADVIL) 200 MG tablet Take 400 mg by mouth every 6 (six) hours as needed for mild pain or moderate pain.     Ibuprofen-diphenhydrAMINE  HCl (IBUPROFEN PM) 200-25 MG CAPS Take 2 tablets by mouth at bedtime as needed (sleep).     LORazepam (ATIVAN) 0.5 MG tablet Take by mouth.     losartan  (COZAAR ) 100 MG tablet Take 100 mg by mouth daily.     magnesium oxide (MAG-OX) 400 (240 Mg) MG tablet Take 400 mg by mouth daily.     metoprolol  succinate (TOPROL -XL) 50 MG 24 hr tablet Take 50 mg by mouth daily.     omeprazole (PRILOSEC) 40 MG capsule Take 40 mg by mouth daily as needed.     ondansetron  (ZOFRAN ) 8 MG tablet Take 1 tablet (8 mg total) by mouth every 8 (eight) hours as needed for nausea or vomiting. (Patient not taking: Reported on 12/10/2023) 20 tablet 0   OVER THE COUNTER  MEDICATION Take 1 capsule by mouth daily. CBD gummie     prochlorperazine  (COMPAZINE ) 10 MG tablet TAKE ONE TABLET BY MOUTH EVERY 6 HOURS AS NEEDED FOR NAUSEA / VOMITING 30 tablet 2   Tetrahydrozoline HCl (VISINE OP) Place 1 drop into both eyes daily as needed (dry eyes).     traZODone (DESYREL) 50 MG tablet Take 1 tablet by mouth at bedtime.     vitamin B-12 (CYANOCOBALAMIN ) 1000 MCG tablet Take 1,000 mcg by mouth daily.     No current facility-administered medications for this visit.    VITAL SIGNS: There were no vitals taken for this visit. There were no vitals filed for this visit.   Estimated body mass index is 34.37 kg/m as calculated from the following:    Height as of 09/03/23: 5' (1.524 m).   Weight as of an earlier encounter on 12/10/23: 79.8 kg (176 lb).  LABS: CBC:    Component Value Date/Time   WBC 9.7 12/10/2023 0918   WBC 10.9 (H) 11/26/2023 1015   HGB 12.0 12/10/2023 0918   HCT 37.5 12/10/2023 0918   PLT 447 (H) 12/10/2023 0918   MCV 71.0 (L) 12/10/2023 0918   NEUTROABS 6.8 12/10/2023 0918   LYMPHSABS 1.1 12/10/2023 0918   MONOABS 1.3 (H) 12/10/2023 0918   EOSABS 0.3 12/10/2023 0918   BASOSABS 0.1 12/10/2023 0918   Comprehensive Metabolic Panel:    Component Value Date/Time   NA 129 (L) 12/10/2023 0918   K 4.4 12/10/2023 0918   CL 98 12/10/2023 0918   CO2 22 12/10/2023 0918   BUN 29 (H) 12/10/2023 0918   CREATININE 1.40 (H) 12/10/2023 0918   GLUCOSE 105 (H) 12/10/2023 0918   CALCIUM 9.2 12/10/2023 0918   AST 30 12/10/2023 0918   ALT 23 12/10/2023 0918   ALKPHOS 140 (H) 12/10/2023 0918   BILITOT 0.9 12/10/2023 0918   PROT 7.7 12/10/2023 0918   ALBUMIN  3.9 12/10/2023 0918     Present during today's visit: patient and her friend Candis  Assessment and Plan: CBC/CMP reviewed, continue adagrasib  600mg  twice daily.  SCr continues to fluctuant, will continue to monitor  Patient will have repeat PET prior to next office visit   Oral Chemotherapy Side Effect/Intolerance:  Nausea: patient reports she is no longer having nausea and not needing antiemetics currently Diarrhea: managed with Lomotil , patient taking Lomotil  ~ once daily  Oral Chemotherapy Adherence: No missed doses reported No patient barriers to medication adherence identified.   New medications: None reported  Medication Access Issues: No issues, patient fills at [facility] (Specialty)  Patient expressed understanding and was in agreement with this plan. She also understands that She can call clinic at any time with any questions, concerns, or complaints.   Follow-up plan: RTC as scheduled  Thank you for allowing me to participate in the  care of this very pleasant patient.   Time Total: 15 mins  Visit consisted of counseling and education on dealing with issues of symptom management in the setting of serious and potentially life-threatening illness.Greater than 50%  of this time was spent counseling and coordinating care related to the above assessment and plan.  Signed by: Tatisha Cerino N. Inna Tisdell, PharmD, NEILA, CPP Hematology/Oncology Clinical Pharmacist Practitioner [facility] [phone]  12/10/2023 9:52 AM

## 2023-12-11 ENCOUNTER — Telehealth: Payer: Self-pay | Admitting: Pharmacist

## 2023-12-11 ENCOUNTER — Other Ambulatory Visit (HOSPITAL_COMMUNITY): Payer: Self-pay

## 2023-12-11 NOTE — Telephone Encounter (Signed)
 Patient reported hyper pigmentation at last office visit. Submitted a medical inquiry to [facility] and they called to let me know that 11.2% of patient's in the adagasib arm of one of their studies reported hyperpigmentation, all were grade 1-2. They stated the exact mechanism of the interaction is unknown, but they did evaluate for phototoxicity and found none. There are no adverse effects linked to the hyperpigmentation.   Attempted call to patient to share the above information, had to LVM.

## 2023-12-15 NOTE — Telephone Encounter (Signed)
 Spoke with Hannah Yu and shared the information from [facility] about the adagrasib .

## 2023-12-17 ENCOUNTER — Encounter: Payer: Self-pay | Admitting: Radiation Oncology

## 2023-12-17 ENCOUNTER — Ambulatory Visit
Admission: RE | Admit: 2023-12-17 | Discharge: 2023-12-17 | Disposition: A | Discharge status: Home / Self Care | Source: Ambulatory Visit | Attending: Radiation Oncology | Admitting: Radiation Oncology

## 2023-12-17 VITALS — BP 155/62 | HR 70 | Temp 98.2°F | Resp 18 | Ht 60.0 in | Wt 177.6 lb

## 2023-12-17 DIAGNOSIS — C7951 Secondary malignant neoplasm of bone: Secondary | ICD-10-CM | POA: Diagnosis present

## 2023-12-17 NOTE — Progress Notes (Signed)
 Radiation Oncology Follow up Note  Name: Hannah Yu   Date:   12/17/2023 MRN:  [id number] DOB: January 04, 1946    This 78 y.o. female presents to the clinic today for 1 month follow-up status post radiation therapy to her left scapula and patient with known 4B adenocarcinoma of the lung previously treated with SRS for solitary brain metastasis.  REFERRING PROVIDER: Epifanio Alm SQUIBB, MD  HPI: Patient is a 78 year old female.  Now at 1 month after completed SBRT to her left scapula and shoulder for metastatic disease in patient with known stage IV adenocarcinoma of the lung.  She is seen today in routine follow-up is doing well she has had a complete response as far as pain is concerned.  She has no new areas of pain no specific pulmonary consult complaints.  She is also stable neurologically.  She is currently onAdagrasib which she is tolerating well through medical oncology.  She has no new areas of pain complaint.  No specific neurologic complaints.  She is scheduled for repeat PET CT scan early next week I will review those results when they are available.  COMPLICATIONS OF TREATMENT: none  FOLLOW UP COMPLIANCE: keeps appointments   PHYSICAL EXAM:  BP (!) 155/62   Pulse 70   Temp 98.2 F (36.8 C) (Tympanic)   Resp 18   Ht 5' (1.524 m)   Wt 177 lb 9.6 oz (80.6 kg)   BMI 34.69 kg/m  Range of motion of her left upper extremity MD palpation of her scapula does not elicit pain.  Well-developed well-nourished patient in NAD. HEENT reveals PERLA, EOMI, discs not visualized.  Oral cavity is clear. No oral mucosal lesions are identified. Neck is clear without evidence of cervical or supraclavicular adenopathy. Lungs are clear to A&P. Cardiac examination is essentially unremarkable with regular rate and rhythm without murmur rub or thrill. Abdomen is benign with no organomegaly or masses noted. Motor sensory and DTR levels are equal and symmetric in the upper and lower extremities. Cranial  nerves II through XII are grossly intact. Proprioception is intact. No peripheral adenopathy or edema is identified. No motor or sensory levels are noted. Crude visual fields are within normal range.  RADIOLOGY RESULTS: PET CT scan will be reviewed when available next week.  PLAN: Present time patient has no areas of need palliation at this time.  This may change on review of her PET CT scan.  I will turn follow-up care over to medical oncology.  I be happy to reevaluate the patient anytime the future should that be indicated.  Patient knows to call with any concerns.  I would like to take this opportunity to thank you for allowing me to participate in the care of your patient.SABRA Marcey Penton, MD

## 2023-12-21 ENCOUNTER — Ambulatory Visit
Admission: RE | Admit: 2023-12-21 | Discharge: 2023-12-21 | Disposition: A | Discharge status: Home / Self Care | Source: Ambulatory Visit | Attending: Oncology | Admitting: Oncology

## 2023-12-21 DIAGNOSIS — C3492 Malignant neoplasm of unspecified part of left bronchus or lung: Secondary | ICD-10-CM | POA: Insufficient documentation

## 2023-12-21 DIAGNOSIS — K5730 Diverticulosis of large intestine without perforation or abscess without bleeding: Secondary | ICD-10-CM | POA: Insufficient documentation

## 2023-12-21 DIAGNOSIS — R911 Solitary pulmonary nodule: Secondary | ICD-10-CM | POA: Diagnosis not present

## 2023-12-21 DIAGNOSIS — K8020 Calculus of gallbladder without cholecystitis without obstruction: Secondary | ICD-10-CM | POA: Diagnosis not present

## 2023-12-21 DIAGNOSIS — I700 Atherosclerosis of aorta: Secondary | ICD-10-CM | POA: Insufficient documentation

## 2023-12-21 DIAGNOSIS — J984 Other disorders of lung: Secondary | ICD-10-CM | POA: Insufficient documentation

## 2023-12-21 LAB — GLUCOSE, CAPILLARY: Glucose-Capillary: 111 mg/dL — ABNORMAL HIGH (ref 70–99)

## 2023-12-21 MED ORDER — FLUDEOXYGLUCOSE F - 18 (FDG) INJECTION
9.7300 | Freq: Once | INTRAVENOUS | Status: AC | PRN
  Administered 2023-12-21: 9.73 via INTRAVENOUS

## 2023-12-23 NOTE — Progress Notes (Signed)
 DIVISION OF PULMONARY AND CRITICAL CARE MEDICINE                              FOLLOW UP ENCOUNTER     Chief complaint: COPD with Lung adenocarcinoma  History of Present Illness Hannah Yu is a 78 year old female with sleep apnea who presents with difficulty using CPAP due to claustrophobia.  She experiences significant difficulty using her CPAP machine due to severe claustrophobia. Despite attempts to use the machine, she feels 'closed in' and unable to breathe, leading her to stay awake for half the night before removing it. She has tried both the nasal mask and the nasal pillow, but neither is tolerable due to the sensation of having something around her head.  She is currently taking a diuretic, which causes her to urinate once at night. She drinks a lot of water and is surprised she does not urinate more frequently. No swelling over her belly or ankles and no pain are reported.  She is not using any inhalers. She checks her oxygen levels before bed and in the morning, noting they range from 90 to 96 without using any oxygen support during sleep. She has not been using the oxygen or CPAP machine recently.   Past Medical History:   Past Medical History:  Diagnosis Date  . Allergy   . Arrhythmia   . B12 deficiency   . Chronic acquired lymphedema   . Diverticulitis   . GERD (gastroesophageal reflux disease)   . History of cataract   . History of mammogram 03/01/2013  . Hyperlipidemia   . Hypertension   . Osteoarthritis     Past Surgical History:   Past Surgical History:  Procedure Laterality Date  . COLONOSCOPY  10/05/2007   Adenomatous Polyp: CBF 10/2012  . ARTHROSCOPIC ROTATOR CUFF REPAIR  08/2008  . COLONOSCOPY  06/16/2014   Adenomatos Polyp: CBF 06/2019  . CATARACT EXTRACTION Bilateral   . HYSTERECTOMY     partial  . left hip replacement Left    anterior - 2022 in [place]    Allergies:   Allergies  Allergen Reactions   . Zetia [Ezetimibe] Other (See Comments)    Body aches, and stiffness  . Codeine Sulfate Other (See Comments)    Stomach upset  . Cortisone Swelling  . Lovastatin Muscle Pain    Current Medications:   Prior to Admission medications  Medication Sig Taking? Last Dose  adagrasib  (KRAZATI ) 200 mg tablet Take 600 mg by mouth 2 (two) times daily Yes Taking  aspirin  81 MG EC tablet Take 81 mg by mouth once daily. Yes Taking  cholecalciferol  (VITAMIN D3) 1000 unit tablet Take 1,000 Units by mouth once daily. Yes Taking  cyanocobalamin  (VITAMIN B12) 1000 MCG tablet Take 1 tablet by mouth once daily Yes Taking  FUROsemide  (LASIX ) 40 MG tablet Take 1 tablet (40 mg total) by mouth once daily Yes Taking  ibuprofen (MOTRIN) 200 MG tablet Take 400 mg by mouth every 6 (six) hours as needed for Pain Yes Taking  losartan  (COZAAR ) 100 MG tablet TAKE ONE TABLET BY MOUTH EVERY DAY Yes Taking  mecobalamin (B12 ACTIVE ORAL) Take 5,000 mcg by mouth once daily Yes Taking  metoprolol  SUCCinate (TOPROL -XL) 50 MG XL tablet TAKE 1  TABLET BY MOUTH DAILY Yes Taking  multivitamin tablet Take 1 tablet by mouth once daily. Yes Taking  omeprazole (PRILOSEC) 40 MG DR capsule Take 1 capsule (40 mg total) by mouth once daily Yes Taking  traZODone (DESYREL) 50 MG tablet Take 1 tablet (50 mg total) by mouth at bedtime Yes Taking  cyclobenzaprine  (FLEXERIL ) 5 MG tablet Take 1 tablet (5 mg total) by mouth once daily as needed for Muscle spasms Patient not taking: Reported on 12/23/2023  Not Taking  FIBER, HERBAL, ORAL Take 1 tablet by mouth once daily    Patient not taking: Reported on 12/23/2023  Not Taking  fluticasone-umeclidinium-vilanterol (TRELEGY ELLIPTA) 100-62.5-25 mcg inhaler Inhale 1 Puff into the lungs once daily Patient not taking: Reported on 12/23/2023  Not Taking  Herbal Supplement Take 2 capsules by mouth once daily Herbal Name: Neurop AWAY Nerve Support Formula Patient not taking: Reported on 12/23/2023  Not  Taking  ondansetron  (ZOFRAN -ODT) 4 MG disintegrating tablet DISSOLVE 1 TABLET UNDER THE TONGUE EVERY8 HOURS AS NEEDED FOR NAUSEA Patient not taking: Reported on 12/23/2023  Not Taking  traMADoL  (ULTRAM ) 50 mg tablet Take 1 tablet (50 mg total) by mouth 2 (two) times daily as needed 1 po bid prn Patient not taking: Reported on 12/23/2023  Not Taking    Family History:   Family History  Problem Relation Name Age of Onset  . Myocardial Infarction (Heart attack) Mother Richard Kotter   . Autoimmune disease Mother Jerilyn Gillaspie   . High blood pressure (Hypertension) Mother Lavada Langsam   . Hyperlipidemia (Elevated cholesterol) Mother Richard Quirion   . Coronary Artery Disease (Blocked arteries around heart) Mother Richard Mcmeans   . Lung cancer Father Curtistine Hayes   . Myocardial Infarction (Heart attack) Father Curtistine Noreen   . Hyperlipidemia (Elevated cholesterol) Father Curtistine Weekes   . High blood pressure (Hypertension) Father Curtistine Marsicano   . Coronary Artery Disease (Blocked arteries around heart) Father Curtistine Rivest   . Osteoarthritis Father Curtistine Tripodi   . Clotting disorder Sister    . Diabetes Brother Richard Duma   . Hyperlipidemia (Elevated cholesterol) Brother Richard Houdek   . Coronary Artery Disease (Blocked arteries around heart) Brother Richard Hancock   . Myocardial Infarction (Heart attack) Maternal Grandmother    . No Known Problems Maternal Grandfather    . Myocardial Infarction (Heart attack) Paternal Grandmother    . Myocardial Infarction (Heart attack) Paternal Grandfather    . Breast cancer Sister Cheryl Hadassah Leona Morene   . No Known Problems Sister    . Breast cancer Sister Torrance Frech DiPalo     Social History:   Social History   Socioeconomic History  . Marital status: Single  . Number of children: 0  . Years of education: 108  . Highest education level: Some college, no degree  Occupational History  .  Occupation: Retired Aeronautical engineer  Tobacco Use  . Smoking status: Former    Types: Cigarettes  . Smokeless tobacco: Never  Vaping Use  . Vaping status: Never Used  Substance and Sexual Activity  . Alcohol use: Yes    Alcohol/week: 1.0 standard drink of alcohol    Types: 1 Glasses of wine per week  . Drug use: Never  . Sexual activity: Not Currently    Partners: Male    Birth control/protection: Surgical  Social History Narrative   Single - lives alone   Brother lives in [place] here from [place] over 40 yrs ago  Social Drivers of Corporate investment banker Strain: Low Risk  (12/08/2023)   Overall Financial Resource Strain (CARDIA)   . Difficulty of Paying Living Expenses: Not hard at all  Food Insecurity: No Food Insecurity (12/08/2023)   Hunger Vital Sign   . Worried About Programme researcher, broadcasting/film/video in the Last Year: Never true   . Ran Out of Food in the Last Year: Never true  Transportation Needs: No Transportation Needs (12/08/2023)   PRAPARE - Transportation   . Lack of Transportation (Medical): No   . Lack of Transportation (Non-Medical): No   [facility]   Social Network  Housing Stability: Low Risk  (12/08/2023)   Housing Stability Vital Sign   . Unable to Pay for Housing in the Last Year: No   . Number of Times Moved in the Last Year: 0   . Homeless in the Last Year: No    Review of Systems:   A 10 point review of systems is negative, except for the pertinent positives and negatives detailed in the HPI.  Vitals:   Vitals:   12/23/23 1358  BP: (!) 144/77  Pulse: 75  SpO2: 96%  Weight: 80.1 kg (176 lb 9.6 oz)  Height: 152.4 cm (5')     Body mass index is 34.49 kg/m.  Physical Exam:   Physical Exam Vitals and nursing note reviewed.  Constitutional:      General: in no acute distress.    Appearance: Normal appearance. Is not ill-appearing, toxic-appearing or diaphoretic.  HENT:     Head: Normocephalic and atraumatic.     Right Ear:  External ear normal.     Left Ear: External ear normal.  Eyes:     General:        Right eye: No discharge.        Left eye: No discharge.     Extraocular Movements: Extraocular movements intact.     Pupils: Pupils are equal, round, and reactive to light.  Cardiovascular:     Rate and Rhythm: Normal rate and regular rhythm.     Pulses: Normal pulses.     Heart sounds: Normal heart sounds. No murmur heard.    No friction rub. No gallop.  Abdominal:     General: Bowel sounds are normal.  Skin:    General: Skin is warm and dry.     Capillary Refill: Capillary refill takes less than 2 seconds.  Neurological:     Mental Status: Patient is alert.     Lab and Imaging Results:   Results DIAGNOSTIC Apnea Index: <1    Assessment and Plan:   Diagnoses and all orders for this visit:  Adenocarcinoma of left lung, stage 4 (CMS/HHS-HCC)  Advanced chronic obstructive pulmonary disease (CMS/HHS-HCC)  Noncompliance with CPAP treatment    Assessment & Plan Obstructive sleep apnea with intolerance to CPAP therapy She has obstructive sleep apnea, previously managed with CPAP therapy. Reports significant intolerance to CPAP due to severe claustrophobia, preventing sleep with the device. Despite using a nasal pillow mask and a low pressure setting (5 to 10 cm H2O), she feels closed in and unable to breathe, leading to discontinuation of CPAP use. Her apnea index improved to less than one with CPAP, indicating effective treatment when used. However, she cannot tolerate the device due to claustrophobia. - Discontinue CPAP therapy due to intolerance. - Discontinue nighttime oxygen therapy as her morning oxygen saturation levels are between 94% to 96%. - Contact the equipment provider to  arrange for the pickup of CPAP and oxygen equipment.  Chronic obstructive pulmonary disease (COPD) She has COPD. Currently, she reports no swelling, no mucus production, and is not using inhalers. She is  taking diuretics, which are effective in managing fluid retention, as she only wakes once at night to urinate.  Hypoxemia Previously required supplemental oxygen. Reports oxygen saturation levels between 94% to 96% in the morning without supplemental oxygen, indicating adequate oxygenation without the need for additional support.     I spent a total of 41 minutes in both face-to-face and non-face-to-face activities, excluding procedures performed, for this visit on the date of this encounter.   This note has been created using dictation software tool and any typographical errors are purely unintentional.  Patient received an After Visit Summary

## 2023-12-24 ENCOUNTER — Encounter: Payer: Self-pay | Admitting: Oncology

## 2023-12-28 ENCOUNTER — Telehealth: Payer: Self-pay | Admitting: Pharmacy Technician

## 2023-12-28 ENCOUNTER — Other Ambulatory Visit: Payer: Self-pay | Admitting: *Deleted

## 2023-12-28 ENCOUNTER — Other Ambulatory Visit (HOSPITAL_COMMUNITY): Payer: Self-pay

## 2023-12-28 DIAGNOSIS — C3492 Malignant neoplasm of unspecified part of left bronchus or lung: Secondary | ICD-10-CM

## 2023-12-28 NOTE — Telephone Encounter (Signed)
 Oral Oncology Patient Advocate Encounter  Prior Authorization for Krazati  has been approved.    PA# [id number]  Effective dates: 12/28/2023 through 06/25/2024  Patient can [facility] to fill at [facility].   Shalik Sanfilippo (Patty) Chet Burnet, CPhT  [facility], [place], [place] Oral Chemotherapy Patient Advocate Specialist III Phone: [phone]  Fax: [phone]

## 2023-12-28 NOTE — Telephone Encounter (Signed)
 Oral Oncology Patient Advocate Encounter   Received notification that prior authorization for Krazati  is required.   PA submitted on CMM via Latent Key [id number]  Status is pending     Hannah Yu (Patty) Chet Burnet, CPhT  [facility], [place], [place] Oral Chemotherapy Patient Advocate Specialist III Phone: [phone]  Fax: [phone]

## 2023-12-29 ENCOUNTER — Other Ambulatory Visit: Payer: Self-pay | Admitting: Oncology

## 2023-12-29 ENCOUNTER — Inpatient Hospital Stay

## 2023-12-29 ENCOUNTER — Encounter: Payer: Self-pay | Admitting: Oncology

## 2023-12-29 ENCOUNTER — Inpatient Hospital Stay: Admitting: Pharmacist

## 2023-12-29 ENCOUNTER — Inpatient Hospital Stay (HOSPITAL_BASED_OUTPATIENT_CLINIC_OR_DEPARTMENT_OTHER): Admitting: Oncology

## 2023-12-29 VITALS — BP 140/78 | HR 80 | Temp 99.1°F | Resp 18 | Ht 60.0 in | Wt 177.0 lb

## 2023-12-29 DIAGNOSIS — C3492 Malignant neoplasm of unspecified part of left bronchus or lung: Secondary | ICD-10-CM | POA: Diagnosis not present

## 2023-12-29 DIAGNOSIS — C7931 Secondary malignant neoplasm of brain: Secondary | ICD-10-CM | POA: Diagnosis not present

## 2023-12-29 LAB — MAGNESIUM: Magnesium: 2.2 mg/dL (ref 1.7–2.4)

## 2023-12-29 LAB — CMP (CANCER CENTER ONLY)
ALT: 21 U/L (ref 0–44)
AST: 30 U/L (ref 15–41)
Albumin: 3.9 g/dL (ref 3.5–5.0)
Alkaline Phosphatase: 151 U/L — ABNORMAL HIGH (ref 38–126)
Anion gap: 9 (ref 5–15)
BUN: 32 mg/dL — ABNORMAL HIGH (ref 8–23)
CO2: 24 mmol/L (ref 22–32)
Calcium: 9.2 mg/dL (ref 8.9–10.3)
Chloride: 93 mmol/L — ABNORMAL LOW (ref 98–111)
Creatinine: 1.3 mg/dL — ABNORMAL HIGH (ref 0.44–1.00)
GFR, Estimated: 42 mL/min — ABNORMAL LOW (ref >60)
Glucose, Bld: 100 mg/dL — ABNORMAL HIGH (ref 70–99)
Potassium: 4.4 mmol/L (ref 3.5–5.1)
Sodium: 126 mmol/L — ABNORMAL LOW (ref 135–145)
Total Bilirubin: 0.8 mg/dL (ref 0.0–1.2)
Total Protein: 7.8 g/dL (ref 6.5–8.1)

## 2023-12-29 LAB — CBC WITH DIFFERENTIAL/PLATELET
Abs Immature Granulocytes: 0.17 K/uL — ABNORMAL HIGH (ref 0.00–0.07)
Basophils Absolute: 0.1 K/uL (ref 0.0–0.1)
Basophils Relative: 1 %
Eosinophils Absolute: 0.1 K/uL (ref 0.0–0.5)
Eosinophils Relative: 1 %
HCT: 38.8 % (ref 36.0–46.0)
Hemoglobin: 12.4 g/dL (ref 12.0–15.0)
Immature Granulocytes: 1 %
Lymphocytes Relative: 11 %
Lymphs Abs: 1.3 K/uL (ref 0.7–4.0)
MCH: 22.9 pg — ABNORMAL LOW (ref 26.0–34.0)
MCHC: 32 g/dL (ref 30.0–36.0)
MCV: 71.6 fL — ABNORMAL LOW (ref 80.0–100.0)
Monocytes Absolute: 1.5 K/uL — ABNORMAL HIGH (ref 0.1–1.0)
Monocytes Relative: 12 %
Neutro Abs: 8.9 K/uL — ABNORMAL HIGH (ref 1.7–7.7)
Neutrophils Relative %: 74 %
Platelets: 462 K/uL — ABNORMAL HIGH (ref 150–400)
RBC: 5.42 MIL/uL — ABNORMAL HIGH (ref 3.87–5.11)
RDW: 19.7 % — ABNORMAL HIGH (ref 11.5–15.5)
WBC: 11.9 K/uL — ABNORMAL HIGH (ref 4.0–10.5)
nRBC: 0 % (ref 0.0–0.2)

## 2023-12-29 NOTE — Progress Notes (Unsigned)
 Is it ok for her to get the RSV vaccine.

## 2023-12-29 NOTE — Progress Notes (Signed)
 DISCONTINUE OFF PATHWAY REGIMEN - Non-Small Cell Lung   [id number]:Pembrolizumab  200 mg IV D1 q21 Days:   A cycle is every 21 days:     Pembrolizumab    **Always confirm dose/schedule in your pharmacy ordering system**  PRIOR TREATMENT: Off Pathway: Pembrolizumab  200 mg IV D1 q21 Days  START ON PATHWAY REGIMEN - Non-Small Cell Lung     A cycle is every 21 days:     Bevacizumab-xxxx      Paclitaxel      Carboplatin   **Always confirm dose/schedule in your pharmacy ordering system**  Patient Characteristics: Stage IV Metastatic, Nonsquamous, Molecular Analysis Completed, Molecular Alteration Present and Targeted Therapy Exhausted OR KRAS G12C+ or HER2+ or NRG1+ or c-Met Present and No Prior Chemo/Immunotherapy OR No Alteration Present, Second Line -  Chemotherapy/Immunotherapy, PS = 0, 1, Prior PD-1/PD-L1 Inhibitor and No Prior Platinum-Based Chemotherapy Therapeutic Status: Stage IV Metastatic Histology: Nonsquamous Cell Broad Molecular Profiling Status: Molecular Analysis Completed Molecular Analysis Results: Alteration Present and Targeted Therapy Exhausted ECOG Performance Status: 0 Chemotherapy/Immunotherapy Line of Therapy: Second Line Chemotherapy/Immunotherapy Immunotherapy Candidate Status: Not a Candidate for Immunotherapy Prior Immunotherapy Status: Prior PD-1/PD-L1 Inhibitor and No Prior Platinum-Based Chemotherapy Intent of Therapy: Non-Curative / Palliative Intent, Discussed with Patient

## 2023-12-29 NOTE — Progress Notes (Signed)
Patient not seen by CPP today

## 2023-12-30 ENCOUNTER — Encounter: Payer: Self-pay | Admitting: Oncology

## 2023-12-30 ENCOUNTER — Other Ambulatory Visit: Payer: Self-pay

## 2023-12-30 NOTE — Progress Notes (Signed)
  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  INTERVAL HISTORY: Patient returns to clinic today for further evaluation, discussion of her PET scan results, and treatment planning.  She is anxious, but otherwise feels well.  She has no neurologic complaints.  She denies any recent fevers or illnesses. She has a good appetite and denies weight loss.  She denies any chest pain, shortness of breath, cough, or hemoptysis.  She denies any nausea, vomiting, constipation, or diarrhea.  She has no urinary complaints.  Patient offers no further specific complaints today.  REVIEW OF SYSTEMS:   Review of Systems  Constitutional: Negative.  Negative for fever, malaise/fatigue and weight loss.  Respiratory: Negative.  Negative for cough, hemoptysis and shortness of breath.   Cardiovascular: Negative.  Negative for chest pain and leg swelling.  Gastrointestinal: Negative.  Negative for abdominal pain.  Genitourinary: Negative.  Negative for dysuria.  Musculoskeletal: Negative.  Negative for back pain and joint pain.  Skin: Negative.  Negative for rash.  Neurological: Negative.  Negative for dizziness, focal weakness, weakness and headaches.  Psychiatric/Behavioral:  The patient is nervous/anxious.     As per HPI. Otherwise, a complete review of systems is negative.  PAST MEDICAL HISTORY: Past Medical History:  Diagnosis Date   Acid reflux    Anemia    Anesthesia complication    Tachycardia previously, now on metoprolol    Arthritis     09/02/2019: per patient have it real bad in both hands and back   Difficult intubation    had to terminate intubation unable to advance tube for cholecystectomy 2017?   DVT (deep venous thrombosis) (HCC)    leg   Hypertension    Sciatica    09/02/2019: has had it for about 3 years    PAST SURGICAL HISTORY: Past Surgical History:  Procedure Laterality Date   ABDOMINAL HYSTERECTOMY     CATARACT EXTRACTION W/ INTRAOCULAR LENS IMPLANT Bilateral 2007   eyes done within 1 month of each other.   IR IMAGING GUIDED PORT INSERTION  05/19/2023   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   TOTAL HIP ARTHROPLASTY Right 09/06/2019   Procedure: RIGHT TOTAL HIP ARTHROPLASTY ANTERIOR APPROACH;  Surgeon: Vernetta Lonni GRADE, MD;  Location: [facility] OR;  Service: Orthopedics;  Laterality: Right;   TOTAL HIP ARTHROPLASTY Left 07/31/2020   Procedure: LEFT TOTAL HIP ARTHROPLASTY ANTERIOR APPROACH;  Surgeon: Vernetta Lonni GRADE, MD;  Location: [facility] OR;  Service: Orthopedics;  Laterality: Left;   VIDEO BRONCHOSCOPY WITH ENDOBRONCHIAL ULTRASOUND N/A 10/06/2022   Procedure: VIDEO BRONCHOSCOPY WITH ENDOBRONCHIAL ULTRASOUND;  Surgeon: Parris Manna, MD;  Location: [facility] ORS;  Service: Thoracic;  Laterality: N/A;    FAMILY HISTORY: Family History  Problem Relation Age of Onset   Breast cancer Sister 76    ADVANCED DIRECTIVES (Y/N):  N  HEALTH MAINTENANCE: Social History   Tobacco Use   Smoking status: Former    Current packs/day: 0.00    Types: Cigarettes    Quit date: 01/02/1993    Years since quitting: 31.0   Smokeless tobacco:  Never  Vaping Use   Vaping status: Never Used  Substance Use Topics   Alcohol use: Yes    Alcohol/week: 4.0 standard drinks of alcohol    Types: 4 Glasses of wine per week    Comment: 09/02/2019: per patient ever so often   Drug use: No     Colonoscopy:  PAP:  Bone density:  Lipid panel:  Allergies   Allergen Reactions   Ezetimibe Other (See Comments)    Body aches, and stiffness   Codeine Diarrhea   Cortisone Swelling    injections   Lovastatin Other (See Comments)    Muscle cramps    Current Outpatient Medications  Medication Sig Dispense Refill   adagrasib  (KRAZATI ) 200 MG tablet Take 3 tablets (600 mg total) by mouth 2 (two) times daily. 180 tablet 2   aspirin  EC 81 MG tablet Take 162 mg by mouth daily. Swallow whole.     Biotin 10 MG CAPS      cholecalciferol  (VITAMIN D3) 25 MCG (1000 UNIT) tablet Take 1,000 Units by mouth daily.     diphenoxylate -atropine  (LOMOTIL ) 2.5-0.025 MG tablet Take 1 tablet by mouth 4 (four) times daily as needed for diarrhea or loose stools. 60 tablet 2   FIBER PO Take 1 tablet by mouth daily.     furosemide  (LASIX ) 40 MG tablet Take 40 mg by mouth daily. 40 mg alternating 80 mg every other day     ibuprofen (ADVIL) 200 MG tablet Take 400 mg by mouth every 6 (six) hours as needed for mild pain or moderate pain.     Ibuprofen-diphenhydrAMINE  HCl (IBUPROFEN PM) 200-25 MG CAPS Take 2 tablets by mouth at bedtime as needed (sleep).     LORazepam (ATIVAN) 0.5 MG tablet Take by mouth.     losartan  (COZAAR ) 100 MG tablet Take 100 mg by mouth daily.     magnesium oxide (MAG-OX) 400 (240 Mg) MG tablet Take 400 mg by mouth daily.     metoprolol  succinate (TOPROL -XL) 50 MG 24 hr tablet Take 50 mg by mouth daily.     omeprazole (PRILOSEC) 40 MG capsule Take 40 mg by mouth daily as needed.     OVER THE COUNTER MEDICATION Take 1 capsule by mouth daily. CBD gummie     prochlorperazine  (COMPAZINE ) 10 MG tablet TAKE ONE TABLET BY MOUTH EVERY 6 HOURS AS NEEDED FOR NAUSEA / VOMITING 30 tablet 2   Tetrahydrozoline HCl (VISINE OP) Place 1 drop into both eyes daily as needed (dry eyes).     traZODone (DESYREL) 50 MG tablet Take 1 tablet by mouth at bedtime.     vitamin B-12 (CYANOCOBALAMIN ) 1000 MCG tablet Take 1,000 mcg by mouth daily.     ondansetron  (ZOFRAN ) 8 MG  tablet Take 1 tablet (8 mg total) by mouth every 8 (eight) hours as needed for nausea or vomiting. (Patient not taking: Reported on 12/29/2023) 20 tablet 0   No current facility-administered medications for this visit.    OBJECTIVE: Vitals:   12/29/23 0922  BP: (!) 140/78  Pulse: 80  Resp: 18  Temp: 99.1 F (37.3 C)  SpO2: 100%      Body mass index is 34.57 kg/m.    ECOG FS:0 - Asymptomatic  General: Well-developed, well-nourished, no acute distress. Eyes: Pink conjunctiva, anicteric sclera. HEENT: Normocephalic, moist mucous membranes. Lungs: No audible wheezing or coughing. Heart: Regular rate and rhythm. Abdomen: Soft, nontender, no obvious distention. Musculoskeletal: No edema, cyanosis, or clubbing. Neuro: Alert, answering all questions appropriately.  Cranial nerves grossly intact. Skin: No rashes or petechiae noted. Psych: Normal affect.  LAB RESULTS:  Lab Results  Component Value Date   NA 126 (L) 12/29/2023   K 4.4 12/29/2023   CL 93 (L) 12/29/2023   CO2 24 12/29/2023   GLUCOSE 100 (H) 12/29/2023   BUN 32 (H) 12/29/2023   CREATININE 1.30 (H) 12/29/2023   CALCIUM 9.2 12/29/2023   PROT 7.8 12/29/2023   ALBUMIN  3.9 12/29/2023   AST 30 12/29/2023   ALT 21 12/29/2023   ALKPHOS 151 (H) 12/29/2023   BILITOT 0.8 12/29/2023   GFRNONAA 42 (L) 12/29/2023   GFRAA >60 09/10/2019    Lab Results  Component Value Date   WBC 11.9 (H) 12/29/2023   NEUTROABS 8.9 (H) 12/29/2023   HGB 12.4 12/29/2023   HCT 38.8 12/29/2023   MCV 71.6 (L) 12/29/2023   PLT 462 (H) 12/29/2023     STUDIES: NM PET Image Restag (PS) Skull Base To Thigh Result Date: 12/24/2023 CLINICAL DATA:  Subsequent treatment strategy for left lung adenocarcinoma. EXAM: NUCLEAR MEDICINE PET SKULL BASE TO THIGH TECHNIQUE: 9.7 mCi F-18 FDG was injected intravenously. Full-ring PET imaging was performed from the skull base to thigh after the radiotracer. CT data was obtained and used for attenuation  correction and anatomic localization. Fasting blood glucose: 111 mg/dl COMPARISON:  93/95/7974 FINDINGS: Mediastinal blood pool activity: SUV max 2.7 Liver activity: SUV max NA NECK: No hypermetabolic lymph nodes in the neck. Incidental CT findings: None. CHEST: Interval development of hypermetabolic lymph nodes in the left hilum ( SUV max = 5.3).r 1.8 x 1.5 cm perifissural nodule in the left lung on 45/6 was 1.2 x 0.8 cm previously (remeasured). SUV max = 8.2 today compared to 6.3 previously. 11 mm left perifissural nodule r on 56/6 was 6 mm previously. Posterior left upper lobe pulmonary nodule measuring 1.6 x 1.2 cm today shows only low level FDG accumulation but this nodule measured 0.9 x 0.5 cm previously. Left posterolateral focal pleural thickening/nodularity (image 56/6) with demonstrated SUV max = 7.6 previously now with SUV max = 5.2. More posterior focal nodular pleural thickening (53/6) with previously demonstrated SUV max = 6.0 now shows SUV max = 5.5. this finding is stable in size measuring 3.9 cm today compared to 4.0 cm when measured in a similar fashion on the prior study. There is a new hypermetabolic focus in the posteromedial/paraspinal consolidative disease in the left inferior lung SUV max = 7.3. Peripheral hypermetabolism is associated with a subtle hyperattenuating rim measuring 4.9 x 3.1 cm in the posterior left lung on image 66/6, suggesting the presence of a necrotic lesion. The hypermetabolism appears more circumferential on today's study with SUV max = 6.8. Hypermetabolic nodule along the inferior pleura of the left hemithorax (image 68/6) is similar to prior. Incidental CT findings: Right Port-A-Cath tip is positioned at the SVC/RA junction. Coronary artery calcification is evident. Mild atherosclerotic calcification is noted in the wall of the thoracic aorta. ABDOMEN/PELVIS: No abnormal hypermetabolic activity within the liver, pancreas, adrenal glands, or spleen. No hypermetabolic  lymph nodes in the abdomen or pelvis. Intensely hypermetabolic soft tissue nodule in the posterior right thigh was not included in the field of view on the previous exam. Low level FDG accumulation is identified in lymph nodes along the left pelvic sidewall and left groin, similar to prior. Incidental CT findings: Calcified gallstone evident. Vascular calcification seen in the hilum of each kidney. There is moderate atherosclerotic calcification of the abdominal aorta without  aneurysm. Diffuse colonic diverticulosis evident. SKELETON: Hypermetabolic lesion in the left acromion demonstrates SUV max = 7.5 compared to 9.3 previously. Uptake in the region of the left acetabulum along the hip replacement demonstrates SUV max = 7.8 today increased from 7.2 previously. Hypermetabolic lymph nodes are seen along the left pelvic sidewall. Incidental CT findings: None. IMPRESSION: 1. Interval mixed response to therapy with a concern for generalized interval progression of disease. New hypermetabolic lymph nodes in the left hilum and interval increase in size of the perifissural nodules in the left lung. 2. New hypermetabolic focus in the posteromedial/paraspinal consolidative disease in the left inferior lung with another apparent necrotic pulmonary nodule with peripheral hypermetabolism associated with a subtle hyperattenuating rim. There was some FDG uptake in this rim previously but it was non circumferential 3. Some of the pleural based nodules in the lower left hemithorax show minimally decreased hypermetabolism on today's study. Hypermetabolic nodule along the inferior pleura of the left hemithorax is similar to prior. 4. Interval decrease in hypermetabolism associated with the lesion in the left acromion. 5. Stable hypermetabolic lymph nodes along the left pelvic sidewall. 6. Cholelithiasis. 7. Colonic diverticulosis without diverticulitis. 8.  Aortic Atherosclerosis (ICD10-I70.0). Electronically Signed   By: Camellia Candle M.D.   On: 12/24/2023 14:57    ONCOLOGY HISTORY: Patient completed SBRT to the 2.7 cm irregular left lower lobe nodule in July 2024.  PET scan results from February 13, 2023 reviewed independently with residual low level of activity suggesting treatment response in the left lower lobe.  The AP window lymph node seen on subsequent CT scan revealed progressive enlargement now measuring 1.7 x 1.3 cm as well as increased hypermetabolic activity.  Patient completed XRT to this lesion.  Patient has also completed XRT to her left pelvis.    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  Patient initiated Keytruda  on May 07, 2023.  PET scan on July 08, 2023 revealed progressive disease and treatment was changed to Adagrasib  (Krazati ) 600 mg twice daily given her K-ras mutation.  MRI of the brain on August 22, 2023 revealed a 1 cm left lateral cerebellum lesion.  Patient has completed XRT to this lesion.  Her most recent PET scan on December 21, 2023 revealed progression of disease.  Recommendation is to transition to a chemotherapy using carboplatinum, Taxol, and Avastin every 3 weeks.  We also discussed possibly continuing adagrasib  as prescribed in the short-term and repeating a PET in 2 months.  Patient is going to go to chemo education class to further learn about chemotherapy and then call clinic with her decision.   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  Repeat brain MRI in proximately November 2025. Renal insufficiency: Chronic and unchanged.  Patient's creatinine is 1.30 today.  Monitor.  Hyponatremia: Sodium level slightly worse at 126 today. Leukocytosis: Likely reactive, monitor Thrombocytosis: Chronic and unchanged. Shortness of breath/cough: Patient does not complain of this  today. Hypertension: Blood pressure is only mildly elevated today.  Continue monitoring treatment per primary care.    Patient expressed understanding and was in agreement with this plan. She also understands that She can call clinic at any time with any questions, concerns, or complaints.    Cancer Staging  Adenocarcinoma of left lung [facility]) Staging form: Lung, AJCC 8th  Edition - Clinical stage from 03/19/2023: Stage IVB (cTX, cN2, cM1c) - Signed by Jacobo Evalene PARAS, MD on 03/19/2023 Stage prefix: Initial diagnosis   Evalene PARAS Jacobo, MD   12/30/2023 6:50 AM

## 2024-01-01 ENCOUNTER — Other Ambulatory Visit: Payer: Self-pay

## 2024-01-05 ENCOUNTER — Other Ambulatory Visit: Payer: Self-pay

## 2024-01-07 ENCOUNTER — Other Ambulatory Visit: Payer: Self-pay | Admitting: Pharmacy Technician

## 2024-01-07 ENCOUNTER — Other Ambulatory Visit: Payer: Self-pay | Admitting: Oncology

## 2024-01-07 ENCOUNTER — Other Ambulatory Visit

## 2024-01-07 ENCOUNTER — Inpatient Hospital Stay: Attending: Oncology

## 2024-01-07 ENCOUNTER — Ambulatory Visit: Admitting: Oncology

## 2024-01-07 ENCOUNTER — Ambulatory Visit: Admitting: Pharmacist

## 2024-01-07 ENCOUNTER — Inpatient Hospital Stay

## 2024-01-07 ENCOUNTER — Other Ambulatory Visit: Payer: Self-pay

## 2024-01-07 DIAGNOSIS — C3492 Malignant neoplasm of unspecified part of left bronchus or lung: Secondary | ICD-10-CM

## 2024-01-07 NOTE — Progress Notes (Signed)
 [facility] CSW Progress Note  Clinical Social Work introduced self to patient during Patient Education with Raoul Moats, Charity fundraiser.  Provided information regarding CSW role, including counseling, advanced care planning and support group.  Answered questions as needed.  Follow Up Plan:  CSW will follow-up with patient by phone     Macario CHRISTELLA Au, LCSW Clinical Social Worker [facility]

## 2024-01-07 NOTE — Progress Notes (Signed)
 Specialty Pharmacy Refill Coordination Note  Hannah Yu is a 78 y.o. female contacted today regarding refills of specialty medication(s) Adagrasib  (Krazati )   Patient requested [name] at [facility] at [place] date: 01/08/24   Medication will be filled on 01/07/24.  Patient request to pick up med at pharmacy on 01/08/24.

## 2024-01-08 ENCOUNTER — Other Ambulatory Visit: Payer: Self-pay | Admitting: Pharmacist

## 2024-01-08 ENCOUNTER — Other Ambulatory Visit: Payer: Self-pay

## 2024-01-08 ENCOUNTER — Other Ambulatory Visit: Payer: Self-pay | Admitting: *Deleted

## 2024-01-08 ENCOUNTER — Other Ambulatory Visit (HOSPITAL_COMMUNITY): Payer: Self-pay

## 2024-01-08 DIAGNOSIS — C3492 Malignant neoplasm of unspecified part of left bronchus or lung: Secondary | ICD-10-CM

## 2024-01-08 MED ORDER — ONDANSETRON HCL 8 MG PO TABS: 8.0000 mg | ORAL_TABLET | Freq: Three times a day (TID) | ORAL | 3 refills | Status: DC | PRN

## 2024-01-08 NOTE — Progress Notes (Signed)
 Patient stopping Krazati , disenrolling patient.

## 2024-01-13 ENCOUNTER — Other Ambulatory Visit: Payer: Self-pay

## 2024-01-18 ENCOUNTER — Other Ambulatory Visit: Payer: Self-pay

## 2024-01-18 ENCOUNTER — Other Ambulatory Visit (HOSPITAL_COMMUNITY): Payer: Self-pay

## 2024-01-20 NOTE — Progress Notes (Signed)
 Pharmacist Chemotherapy Monitoring - Initial Assessment    Anticipated start date: 02/03/24   The following has been reviewed per standard work regarding the patient's treatment regimen: The patient's diagnosis, treatment plan and drug doses, and organ/hematologic function Lab orders and baseline tests specific to treatment regimen  The treatment plan start date, drug sequencing, and pre-medications Prior authorization status  Patient's documented medication list, including drug-drug interaction screen and prescriptions for anti-emetics and supportive care specific to the treatment regimen The drug concentrations, fluid compatibility, administration routes, and timing of the medications to be used The patient's access for treatment and lifetime cumulative dose history, if applicable  The patient's medication allergies and previous infusion related reactions, if applicable  PET scan on December 21, 2023 revealed progression of disease  transition to a chemotherapy using carboplatinum, Taxol, and Avastin every 3 weeks.    Follow up needed:  N/A   Redell JINNY Gaskins, [facility], 01/20/2024  2:54 PM

## 2024-01-25 ENCOUNTER — Encounter: Payer: Self-pay | Admitting: Oncology

## 2024-01-25 ENCOUNTER — Other Ambulatory Visit: Payer: Self-pay | Admitting: Oncology

## 2024-01-27 ENCOUNTER — Encounter: Payer: Self-pay | Admitting: Oncology

## 2024-02-03 ENCOUNTER — Other Ambulatory Visit: Payer: Self-pay | Admitting: *Deleted

## 2024-02-03 ENCOUNTER — Encounter: Payer: Self-pay | Admitting: Oncology

## 2024-02-03 ENCOUNTER — Other Ambulatory Visit: Payer: Self-pay | Admitting: Oncology

## 2024-02-03 ENCOUNTER — Inpatient Hospital Stay: Attending: Oncology

## 2024-02-03 ENCOUNTER — Inpatient Hospital Stay (HOSPITAL_BASED_OUTPATIENT_CLINIC_OR_DEPARTMENT_OTHER): Admitting: Oncology

## 2024-02-03 ENCOUNTER — Inpatient Hospital Stay

## 2024-02-03 VITALS — BP 142/65 | HR 98 | Temp 99.0°F | Resp 18 | Ht 60.0 in

## 2024-02-03 VITALS — BP 148/63 | HR 79 | Temp 98.3°F | Resp 18 | Wt 174.8 lb

## 2024-02-03 DIAGNOSIS — Z5189 Encounter for other specified aftercare: Secondary | ICD-10-CM | POA: Insufficient documentation

## 2024-02-03 DIAGNOSIS — Z87891 Personal history of nicotine dependence: Secondary | ICD-10-CM | POA: Insufficient documentation

## 2024-02-03 DIAGNOSIS — Z5111 Encounter for antineoplastic chemotherapy: Secondary | ICD-10-CM | POA: Diagnosis present

## 2024-02-03 DIAGNOSIS — C3492 Malignant neoplasm of unspecified part of left bronchus or lung: Secondary | ICD-10-CM | POA: Diagnosis not present

## 2024-02-03 DIAGNOSIS — C7951 Secondary malignant neoplasm of bone: Secondary | ICD-10-CM | POA: Insufficient documentation

## 2024-02-03 DIAGNOSIS — Z79899 Other long term (current) drug therapy: Secondary | ICD-10-CM | POA: Diagnosis not present

## 2024-02-03 DIAGNOSIS — C3432 Malignant neoplasm of lower lobe, left bronchus or lung: Secondary | ICD-10-CM | POA: Diagnosis present

## 2024-02-03 DIAGNOSIS — Z5112 Encounter for antineoplastic immunotherapy: Secondary | ICD-10-CM | POA: Insufficient documentation

## 2024-02-03 DIAGNOSIS — Z452 Encounter for adjustment and management of vascular access device: Secondary | ICD-10-CM | POA: Diagnosis not present

## 2024-02-03 DIAGNOSIS — C7931 Secondary malignant neoplasm of brain: Secondary | ICD-10-CM | POA: Insufficient documentation

## 2024-02-03 LAB — CBC WITH DIFFERENTIAL (CANCER CENTER ONLY)
Abs Immature Granulocytes: 0.21 K/uL — ABNORMAL HIGH (ref 0.00–0.07)
Basophils Absolute: 0 K/uL (ref 0.0–0.1)
Basophils Relative: 0 %
Eosinophils Absolute: 0.2 K/uL (ref 0.0–0.5)
Eosinophils Relative: 1 %
HCT: 39.2 % (ref 36.0–46.0)
Hemoglobin: 12.3 g/dL (ref 12.0–15.0)
Immature Granulocytes: 1 %
Lymphocytes Relative: 8 %
Lymphs Abs: 1.4 K/uL (ref 0.7–4.0)
MCH: 22.7 pg — ABNORMAL LOW (ref 26.0–34.0)
MCHC: 31.4 g/dL (ref 30.0–36.0)
MCV: 72.2 fL — ABNORMAL LOW (ref 80.0–100.0)
Monocytes Absolute: 1.8 K/uL — ABNORMAL HIGH (ref 0.1–1.0)
Monocytes Relative: 11 %
Neutro Abs: 12.9 K/uL — ABNORMAL HIGH (ref 1.7–7.7)
Neutrophils Relative %: 79 %
Platelet Count: 554 K/uL — ABNORMAL HIGH (ref 150–400)
RBC: 5.43 MIL/uL — ABNORMAL HIGH (ref 3.87–5.11)
RDW: 19.3 % — ABNORMAL HIGH (ref 11.5–15.5)
WBC Count: 16.5 K/uL — ABNORMAL HIGH (ref 4.0–10.5)
nRBC: 0 % (ref 0.0–0.2)

## 2024-02-03 LAB — CMP (CANCER CENTER ONLY)
ALT: 19 U/L (ref 0–44)
AST: 22 U/L (ref 15–41)
Albumin: 3.5 g/dL (ref 3.5–5.0)
Alkaline Phosphatase: 161 U/L — ABNORMAL HIGH (ref 38–126)
Anion gap: 8 (ref 5–15)
BUN: 36 mg/dL — ABNORMAL HIGH (ref 8–23)
CO2: 27 mmol/L (ref 22–32)
Calcium: 8.9 mg/dL (ref 8.9–10.3)
Chloride: 100 mmol/L (ref 98–111)
Creatinine: 1 mg/dL (ref 0.44–1.00)
GFR, Estimated: 57 mL/min — ABNORMAL LOW (ref >60)
Glucose, Bld: 110 mg/dL — ABNORMAL HIGH (ref 70–99)
Potassium: 3.6 mmol/L (ref 3.5–5.1)
Sodium: 135 mmol/L (ref 135–145)
Total Bilirubin: 0.8 mg/dL (ref 0.0–1.2)
Total Protein: 7.2 g/dL (ref 6.5–8.1)

## 2024-02-03 LAB — TOTAL PROTEIN, URINE DIPSTICK: Protein, ur: NEGATIVE mg/dL

## 2024-02-03 MED ORDER — SODIUM CHLORIDE 0.9 % IV SOLN
15.0000 mg/kg | Freq: Once | INTRAVENOUS | Status: AC
  Administered 2024-02-03: 1200 mg via INTRAVENOUS
  Filled 2024-02-03: qty 48

## 2024-02-03 MED ORDER — FAMOTIDINE IN NACL 20-0.9 MG/50ML-% IV SOLN
20.0000 mg | Freq: Once | INTRAVENOUS | Status: AC
  Administered 2024-02-03: 20 mg via INTRAVENOUS
  Filled 2024-02-03: qty 50

## 2024-02-03 MED ORDER — APREPITANT 130 MG/18ML IV EMUL
130.0000 mg | Freq: Once | INTRAVENOUS | Status: AC
  Administered 2024-02-03: 130 mg via INTRAVENOUS
  Filled 2024-02-03: qty 18

## 2024-02-03 MED ORDER — PALONOSETRON HCL INJECTION 0.25 MG/5ML
0.2500 mg | Freq: Once | INTRAVENOUS | Status: AC
  Administered 2024-02-03: 0.25 mg via INTRAVENOUS
  Filled 2024-02-03: qty 5

## 2024-02-03 MED ORDER — OXYCODONE HCL 5 MG PO TABS
5.0000 mg | ORAL_TABLET | Freq: Once | ORAL | Status: AC
  Administered 2024-02-03: 5 mg via ORAL
  Filled 2024-02-03: qty 1

## 2024-02-03 MED ORDER — SODIUM CHLORIDE 0.9 % IV SOLN
498.0000 mg | Freq: Once | INTRAVENOUS | Status: AC
  Administered 2024-02-03: 500 mg via INTRAVENOUS
  Filled 2024-02-03: qty 50

## 2024-02-03 MED ORDER — SODIUM CHLORIDE 0.9 % IV SOLN
INTRAVENOUS | Status: DC
  Filled 2024-02-03: qty 250

## 2024-02-03 MED ORDER — HYDROCODONE-ACETAMINOPHEN 5-325 MG PO TABS: 1.0000 | ORAL_TABLET | ORAL | 0 refills | Status: DC | PRN

## 2024-02-03 MED ORDER — SODIUM CHLORIDE 0.9 % IV SOLN
200.0000 mg/m2 | Freq: Once | INTRAVENOUS | Status: AC
  Administered 2024-02-03: 372 mg via INTRAVENOUS
  Filled 2024-02-03: qty 62

## 2024-02-03 MED ORDER — DEXAMETHASONE SODIUM PHOSPHATE 10 MG/ML IJ SOLN
10.0000 mg | Freq: Once | INTRAMUSCULAR | Status: AC
  Administered 2024-02-03: 10 mg via INTRAVENOUS
  Filled 2024-02-03: qty 1

## 2024-02-03 MED ORDER — DIPHENHYDRAMINE HCL 50 MG/ML IJ SOLN
25.0000 mg | Freq: Once | INTRAMUSCULAR | Status: AC
  Administered 2024-02-03: 25 mg via INTRAVENOUS
  Filled 2024-02-03 (×2): qty 1

## 2024-02-03 NOTE — Patient Instructions (Signed)

## 2024-02-03 NOTE — Progress Notes (Signed)
 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  INTERVAL HISTORY: Patient returns to clinic today for further evaluation and initiation of cycle 1 of carboplatin, Taxol, and Avastin.  She is having significant back pain unrelated to her malignancy, but otherwise feels well.  She has no neurologic complaints.  She denies any recent fevers or illnesses. She has a good appetite and denies weight loss.  She denies any chest pain, shortness of breath, cough, or hemoptysis.  She denies any nausea, vomiting, constipation, or diarrhea.  She has no urinary complaints.  Patient offers no further specific complaints today.  REVIEW OF SYSTEMS:   Review of Systems  Constitutional: Negative.  Negative for fever, malaise/fatigue and weight loss.  Respiratory: Negative.  Negative for cough, hemoptysis and shortness of breath.   Cardiovascular: Negative.  Negative for chest pain and leg swelling.  Gastrointestinal: Negative.  Negative for abdominal pain.  Genitourinary: Negative.  Negative for dysuria.  Musculoskeletal:  Positive for back pain. Negative for joint pain.  Skin: Negative.  Negative for rash.  Neurological: Negative.  Negative for dizziness, focal weakness, weakness and headaches.  Psychiatric/Behavioral:  The patient is nervous/anxious.     As per HPI. Otherwise, a complete review of systems is negative.  PAST MEDICAL HISTORY: Past Medical History:  Diagnosis Date   Acid reflux    Anemia    Anesthesia complication    Tachycardia  previously, now on metoprolol    Arthritis    09/02/2019: per patient have it real bad in both hands and back   Difficult intubation    had to terminate intubation unable to advance tube for cholecystectomy 2017?   DVT (deep venous thrombosis) (HCC)    leg   Hypertension    Sciatica    09/02/2019: has had it for about 3 years    PAST SURGICAL HISTORY: Past Surgical History:  Procedure Laterality Date   ABDOMINAL HYSTERECTOMY     CATARACT EXTRACTION W/ INTRAOCULAR LENS IMPLANT Bilateral 2007   eyes done within 1 month of each other.   IR IMAGING GUIDED PORT INSERTION  05/19/2023   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   TOTAL HIP ARTHROPLASTY Right 09/06/2019   Procedure: RIGHT TOTAL HIP ARTHROPLASTY ANTERIOR APPROACH;  Surgeon: Vernetta Lonni GRADE, MD;  Location: [facility] OR;  Service: Orthopedics;  Laterality: Right;   TOTAL HIP ARTHROPLASTY Left 07/31/2020   Procedure: LEFT TOTAL HIP ARTHROPLASTY ANTERIOR APPROACH;  Surgeon: Vernetta Lonni GRADE, MD;  Location: [facility] OR;  Service: Orthopedics;  Laterality: Left;   VIDEO BRONCHOSCOPY WITH ENDOBRONCHIAL ULTRASOUND N/A 10/06/2022   Procedure: VIDEO BRONCHOSCOPY WITH ENDOBRONCHIAL ULTRASOUND;  Surgeon: Parris Manna, MD;  Location: [facility] ORS;  Service: Thoracic;  Laterality: N/A;    FAMILY HISTORY: Family History  Problem Relation Age of Onset   Breast cancer Sister 76    ADVANCED DIRECTIVES (Y/N):  N  HEALTH MAINTENANCE: Social History   Tobacco Use   Smoking status: Former    Current packs/day: 0.00    Types: Cigarettes    Quit date: 01/02/1993  Years since quitting: 31.1   Smokeless tobacco: Never  Vaping Use   Vaping status: Never Used  Substance Use Topics   Alcohol use: Yes    Alcohol/week: 4.0 standard drinks of alcohol    Types: 4 Glasses of wine per week    Comment: 09/02/2019: per patient ever so often   Drug use: No      Colonoscopy:  PAP:  Bone density:  Lipid panel:  Allergies  Allergen Reactions   Ezetimibe Other (See Comments)    Body aches, and stiffness   Codeine Diarrhea   Cortisone Swelling    injections   Lovastatin Other (See Comments)    Muscle cramps    Current Outpatient Medications  Medication Sig Dispense Refill   aspirin  EC 81 MG tablet Take 162 mg by mouth daily. Swallow whole.     Biotin 10 MG CAPS      cholecalciferol  (VITAMIN D3) 25 MCG (1000 UNIT) tablet Take 1,000 Units by mouth daily.     diphenoxylate -atropine  (LOMOTIL ) 2.5-0.025 MG tablet Take 1 tablet by mouth 4 (four) times daily as needed for diarrhea or loose stools. 60 tablet 2   FIBER PO Take 1 tablet by mouth daily.     furosemide  (LASIX ) 40 MG tablet Take 40 mg by mouth daily. 40 mg alternating 80 mg every other day     ibuprofen (ADVIL) 200 MG tablet Take 400 mg by mouth every 6 (six) hours as needed for mild pain or moderate pain.     Ibuprofen-diphenhydrAMINE  HCl (IBUPROFEN PM) 200-25 MG CAPS Take 2 tablets by mouth at bedtime as needed (sleep).     LORazepam (ATIVAN) 0.5 MG tablet Take by mouth.     losartan  (COZAAR ) 100 MG tablet Take 100 mg by mouth daily.     magnesium oxide (MAG-OX) 400 (240 Mg) MG tablet Take 400 mg by mouth daily.     metoprolol  succinate (TOPROL -XL) 50 MG 24 hr tablet Take 50 mg by mouth daily.     omeprazole (PRILOSEC) 40 MG capsule Take 40 mg by mouth daily as needed.     ondansetron  (ZOFRAN ) 8 MG tablet Take 1 tablet (8 mg total) by mouth every 8 (eight) hours as needed for nausea or vomiting. 30 tablet 3   OVER THE COUNTER MEDICATION Take 1 capsule by mouth daily. CBD gummie     prochlorperazine  (COMPAZINE ) 10 MG tablet TAKE ONE TABLET BY MOUTH EVERY 6 HOURS AS NEEDED FOR NAUSEA / VOMITING 30 tablet 2   Tetrahydrozoline HCl (VISINE OP) Place 1 drop into both eyes daily as needed (dry eyes).     traZODone (DESYREL) 50 MG tablet Take 1 tablet by mouth at bedtime.     vitamin  B-12 (CYANOCOBALAMIN ) 1000 MCG tablet Take 1,000 mcg by mouth daily.     No current facility-administered medications for this visit.    OBJECTIVE: Vitals:   02/03/24 0907  BP: (!) 142/65  Pulse: 98  Resp: 18  Temp: 99 F (37.2 C)  SpO2: 96%      Body mass index is 34.57 kg/m.    ECOG FS:0 - Asymptomatic  General: Well-developed, well-nourished, no acute distress.  Sitting in a wheelchair. Eyes: Pink conjunctiva, anicteric sclera. HEENT: Normocephalic, moist mucous membranes. Lungs: No audible wheezing or coughing. Heart: Regular rate and rhythm. Abdomen: Soft, nontender, no obvious distention. Musculoskeletal: No edema, cyanosis, or clubbing. Neuro: Alert, answering all questions appropriately. Cranial nerves grossly intact. Skin: No rashes or petechiae noted. Psych: Normal affect.  LAB  RESULTS:  Lab Results  Component Value Date   NA 135 02/03/2024   K 3.6 02/03/2024   CL 100 02/03/2024   CO2 27 02/03/2024   GLUCOSE 110 (H) 02/03/2024   BUN 36 (H) 02/03/2024   CREATININE 1.00 02/03/2024   CALCIUM 8.9 02/03/2024   PROT 7.2 02/03/2024   ALBUMIN  3.5 02/03/2024   AST 22 02/03/2024   ALT 19 02/03/2024   ALKPHOS 161 (H) 02/03/2024   BILITOT 0.8 02/03/2024   GFRNONAA 57 (L) 02/03/2024   GFRAA >60 09/10/2019    Lab Results  Component Value Date   WBC 16.5 (H) 02/03/2024   NEUTROABS 12.9 (H) 02/03/2024   HGB 12.3 02/03/2024   HCT 39.2 02/03/2024   MCV 72.2 (L) 02/03/2024   PLT 554 (H) 02/03/2024     STUDIES: No results found.   ONCOLOGY HISTORY: Patient completed SBRT to the 2.7 cm irregular left lower lobe nodule in July 2024.  PET scan results from February 13, 2023 reviewed independently with residual low level of activity suggesting treatment response in the left lower lobe.  The AP window lymph node seen on subsequent CT scan revealed progressive enlargement now measuring 1.7 x 1.3 cm as well as increased hypermetabolic activity.  Patient completed XRT  to this lesion.  Patient has also completed XRT to her left pelvis.  Patient initiated Keytruda  on May 07, 2023.  PET scan on July 08, 2023 revealed progressive disease and treatment was changed to Adagrasib  (Krazati ) 600 mg twice daily given her K-ras mutation.  MRI of the brain on August 22, 2023 revealed a 1 cm left lateral cerebellum lesion.  Patient has completed XRT to this lesion.  Her most recent PET scan on December 21, 2023 revealed progression of disease.   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  PET scan on December 21, 2023 revealed progression of disease.  Recommendation is to transition to chemotherapy using carboplatinum, Taxol, and Avastin every 3 weeks.  Proceed with cycle 1 of treatment today.  Return to clinic in 1 week for laboratory work and further evaluation and then in 3 weeks for further evaluation and consideration of cycle 2.  Will reimage with PET scan after the conclusion of cycle 4.   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  Repeat brain MRI in proximately November 2025. Renal insufficiency: Resolved.   Hyponatremia: Resolved. Leukocytosis: Likely reactive, monitor. Thrombocytosis: Chronic and unchanged.   Shortness of breath/cough: Patient does not complain of this today. Hypertension: Chronic and unchanged.  Continue monitoring treatment per primary care. Back pain/sciatica: Appears to be unrelated to malignancy.  Patient was given a prescription for hydrocodone today.  Can consider lumbar spine MRI in the future if needed.    Patient expressed understanding and was in agreement with this plan. She also understands that She can call clinic at any time with any questions, concerns, or complaints.    Cancer Staging  Adenocarcinoma of left lung  [facility]) Staging form: Lung, AJCC 8th Edition - Clinical stage from 03/19/2023: Stage IVB (cTX, cN2, cM1c) - Signed by Jacobo Evalene PARAS, MD on 03/19/2023 Stage prefix: Initial diagnosis   Evalene PARAS Jacobo, MD   02/03/2024 9:39 AM

## 2024-02-03 NOTE — Progress Notes (Signed)
 Patient is having pain and feeling bad for the past couple of days. She would like to discuss pain medication.

## 2024-02-03 NOTE — Patient Instructions (Signed)
 [facility] - A DEPT OF [facility]  Discharge Instructions: Thank you for choosing [facility] to provide your oncology and hematology care.  If you have a lab appointment with the Cancer Center, please go directly to the Cancer Center and check in at the registration area.  Wear comfortable clothing and clothing appropriate for easy access to any Portacath or PICC line.   We strive to give you quality time with your provider. You may need to reschedule your appointment if you arrive late (15 or more minutes).  Arriving late affects you and other patients whose appointments are after yours.  Also, if you miss three or more appointments without notifying the office, you may be dismissed from the clinic at the provider's discretion.      For prescription refill requests, have your pharmacy contact our office and allow 72 hours for refills to be completed.    Today you received the following chemotherapy and/or immunotherapy agents Bevacizumab, Taxol and Carboplatin        To help prevent nausea and vomiting after your treatment, we encourage you to take your nausea medication as directed.  BELOW ARE SYMPTOMS THAT SHOULD BE REPORTED IMMEDIATELY: *FEVER GREATER THAN 100.4 F (38 C) OR HIGHER *CHILLS OR SWEATING *NAUSEA AND VOMITING THAT IS NOT CONTROLLED WITH YOUR NAUSEA MEDICATION *UNUSUAL SHORTNESS OF BREATH *UNUSUAL BRUISING OR BLEEDING *URINARY PROBLEMS (pain or burning when urinating, or frequent urination) *BOWEL PROBLEMS (unusual diarrhea, constipation, pain near the anus) TENDERNESS IN MOUTH AND THROAT WITH OR WITHOUT PRESENCE OF ULCERS (sore throat, sores in mouth, or a toothache) UNUSUAL RASH, SWELLING OR PAIN  UNUSUAL VAGINAL DISCHARGE OR ITCHING   Items with * indicate a potential emergency and should be followed up as soon as possible or go to the Emergency Department if any problems should occur.  Please show the CHEMOTHERAPY  ALERT CARD or IMMUNOTHERAPY ALERT CARD at check-in to the Emergency Department and triage nurse.  Should you have questions after your visit or need to cancel or reschedule your appointment, please contact [facility] - A DEPT OF [facility]  [phone] and follow the prompts.  Office hours are 8:00 a.m. to 4:30 p.m. Monday - Friday. Please note that voicemails left after 4:00 p.m. may not be returned until the following business day.  We are closed weekends and major holidays. You have access to a nurse at all times for urgent questions. Please call the main number to the clinic [phone] and follow the prompts.  For any non-urgent questions, you may also contact your provider using MyChart. We now offer e-Visits for anyone 55 and older to request care online for non-urgent symptoms. For details visit [email or web address].   Also download the MyChart app! Go to the app store, search MyChart, open the app, select [facility], and log in with your MyChart username and password.

## 2024-02-04 ENCOUNTER — Encounter: Payer: Self-pay | Admitting: Oncology

## 2024-02-05 ENCOUNTER — Inpatient Hospital Stay

## 2024-02-05 DIAGNOSIS — C3492 Malignant neoplasm of unspecified part of left bronchus or lung: Secondary | ICD-10-CM

## 2024-02-05 DIAGNOSIS — Z5112 Encounter for antineoplastic immunotherapy: Secondary | ICD-10-CM | POA: Diagnosis not present

## 2024-02-05 MED ORDER — PEGFILGRASTIM-FPGK 6 MG/0.6ML ~~LOC~~ SOSY
6.0000 mg | PREFILLED_SYRINGE | Freq: Once | SUBCUTANEOUS | Status: AC
  Administered 2024-02-05: 6 mg via SUBCUTANEOUS
  Filled 2024-02-05: qty 0.6

## 2024-02-08 ENCOUNTER — Encounter: Payer: Self-pay | Admitting: Oncology

## 2024-02-10 ENCOUNTER — Other Ambulatory Visit: Payer: Self-pay | Admitting: *Deleted

## 2024-02-10 DIAGNOSIS — C3492 Malignant neoplasm of unspecified part of left bronchus or lung: Secondary | ICD-10-CM

## 2024-02-11 ENCOUNTER — Inpatient Hospital Stay (HOSPITAL_BASED_OUTPATIENT_CLINIC_OR_DEPARTMENT_OTHER): Admitting: Hospice and Palliative Medicine

## 2024-02-11 ENCOUNTER — Inpatient Hospital Stay (HOSPITAL_BASED_OUTPATIENT_CLINIC_OR_DEPARTMENT_OTHER): Admitting: Oncology

## 2024-02-11 ENCOUNTER — Inpatient Hospital Stay

## 2024-02-11 ENCOUNTER — Encounter: Payer: Self-pay | Admitting: Oncology

## 2024-02-11 VITALS — BP 146/77 | HR 85 | Temp 98.2°F | Resp 18 | Ht 60.0 in | Wt 170.0 lb

## 2024-02-11 DIAGNOSIS — Z515 Encounter for palliative care: Secondary | ICD-10-CM

## 2024-02-11 DIAGNOSIS — C3492 Malignant neoplasm of unspecified part of left bronchus or lung: Secondary | ICD-10-CM

## 2024-02-11 DIAGNOSIS — Z5112 Encounter for antineoplastic immunotherapy: Secondary | ICD-10-CM | POA: Diagnosis not present

## 2024-02-11 DIAGNOSIS — G893 Neoplasm related pain (acute) (chronic): Secondary | ICD-10-CM

## 2024-02-11 LAB — CMP (CANCER CENTER ONLY)
ALT: 18 U/L (ref 0–44)
AST: 27 U/L (ref 15–41)
Albumin: 3.6 g/dL (ref 3.5–5.0)
Alkaline Phosphatase: 185 U/L — ABNORMAL HIGH (ref 38–126)
Anion gap: 11 (ref 5–15)
BUN: 20 mg/dL (ref 8–23)
CO2: 26 mmol/L (ref 22–32)
Calcium: 9.1 mg/dL (ref 8.9–10.3)
Chloride: 92 mmol/L — ABNORMAL LOW (ref 98–111)
Creatinine: 0.94 mg/dL (ref 0.44–1.00)
GFR, Estimated: >60 mL/min (ref >60)
Glucose, Bld: 119 mg/dL — ABNORMAL HIGH (ref 70–99)
Potassium: 4.1 mmol/L (ref 3.5–5.1)
Sodium: 129 mmol/L — ABNORMAL LOW (ref 135–145)
Total Bilirubin: 0.2 mg/dL (ref 0.0–1.2)
Total Protein: 7.1 g/dL (ref 6.5–8.1)

## 2024-02-11 LAB — CBC WITH DIFFERENTIAL/PLATELET
Abs Immature Granulocytes: 2.03 K/uL — ABNORMAL HIGH (ref 0.00–0.07)
Basophils Absolute: 0.1 K/uL (ref 0.0–0.1)
Basophils Relative: 0 %
Eosinophils Absolute: 1.1 K/uL — ABNORMAL HIGH (ref 0.0–0.5)
Eosinophils Relative: 3 %
HCT: 37.7 % (ref 36.0–46.0)
Hemoglobin: 12.1 g/dL (ref 12.0–15.0)
Immature Granulocytes: 7 %
Lymphocytes Relative: 6 %
Lymphs Abs: 2 K/uL (ref 0.7–4.0)
MCH: 23.1 pg — ABNORMAL LOW (ref 26.0–34.0)
MCHC: 32.1 g/dL (ref 30.0–36.0)
MCV: 72.1 fL — ABNORMAL LOW (ref 80.0–100.0)
Monocytes Absolute: 4.1 K/uL — ABNORMAL HIGH (ref 0.1–1.0)
Monocytes Relative: 13 %
Neutro Abs: 21.6 K/uL — ABNORMAL HIGH (ref 1.7–7.7)
Neutrophils Relative %: 71 %
Platelets: 308 K/uL (ref 150–400)
RBC: 5.23 MIL/uL — ABNORMAL HIGH (ref 3.87–5.11)
RDW: 17.6 % — ABNORMAL HIGH (ref 11.5–15.5)
Smear Review: NORMAL
WBC: 30.8 K/uL — ABNORMAL HIGH (ref 4.0–10.5)
nRBC: 0.1 % (ref 0.0–0.2)

## 2024-02-11 MED ORDER — OXYCODONE HCL 5 MG PO TABS: 5.0000 mg | ORAL_TABLET | ORAL | 0 refills | Status: DC | PRN

## 2024-02-11 MED ORDER — METHYLPREDNISOLONE 4 MG PO TBPK: ORAL_TABLET | ORAL | 0 refills | Status: DC

## 2024-02-11 NOTE — Progress Notes (Signed)
 Palliative Medicine [facility] Telephone:(336) [phone] Fax:(336) [phone]   Name: Hannah Yu Date: 02/11/2024 MRN: [id number]  DOB: [birth year]  Patient Care Team: Epifanio Alm SQUIBB, MD as PCP - General (Infectious Diseases) Verdene Gills, RN as Oncology Nurse Navigator Lenn Aran, MD as Consulting Physician (Radiation Oncology) Jacobo Evalene PARAS, MD as Consulting Physician (Oncology)    REASON FOR CONSULTATION: Hannah Yu is a 78 y.o. female with multiple medical problems including stage IVb adenocarcinoma of the lung.  Palliative care was consulted to address goals of manage ongoing symptoms.  SOCIAL HISTORY:     reports that she quit smoking about 31 years ago. Her smoking use included cigarettes. She has never used smokeless tobacco. She reports current alcohol use of about 4.0 standard drinks of alcohol per week. She reports that she does not use drugs.  Patient is unmarried.  Has no children.  Lives at home with her brother.  Patient previously worked in office.  ADVANCE DIRECTIVES:  Not on file  CODE STATUS:   PAST MEDICAL HISTORY: Past Medical History:  Diagnosis Date   Acid reflux    Anemia    Anesthesia complication    Tachycardia previously, now on metoprolol    Arthritis    09/02/2019: per patient have it real bad in both hands and back   Difficult intubation    had to terminate intubation unable to advance tube for cholecystectomy 2017?   DVT (deep venous thrombosis) (HCC)    leg   Hypertension    Sciatica    09/02/2019: has had it for about 3 years    PAST SURGICAL HISTORY:  Past Surgical History:  Procedure Laterality Date   ABDOMINAL HYSTERECTOMY     CATARACT EXTRACTION W/ INTRAOCULAR LENS IMPLANT Bilateral 2007   eyes done within 1 month of each other.   IR IMAGING GUIDED PORT INSERTION  05/19/2023   SHOULDER ARTHROSCOPY WITH OPEN ROTATOR CUFF REPAIR Right 2012   TOTAL HIP  ARTHROPLASTY Right 09/06/2019   TOTAL HIP ARTHROPLASTY Right 09/06/2019   Procedure: RIGHT TOTAL HIP ARTHROPLASTY ANTERIOR APPROACH;  Surgeon: Vernetta Lonni GRADE, MD;  Location: [facility] OR;  Service: Orthopedics;  Laterality: Right;   TOTAL HIP ARTHROPLASTY Left 07/31/2020   Procedure: LEFT TOTAL HIP ARTHROPLASTY ANTERIOR APPROACH;  Surgeon: Vernetta Lonni GRADE, MD;  Location: [facility] OR;  Service: Orthopedics;  Laterality: Left;   VIDEO BRONCHOSCOPY WITH ENDOBRONCHIAL ULTRASOUND N/A 10/06/2022   Procedure: VIDEO BRONCHOSCOPY WITH ENDOBRONCHIAL ULTRASOUND;  Surgeon: Parris Manna, MD;  Location: [facility] ORS;  Service: Thoracic;  Laterality: N/A;    HEMATOLOGY/ONCOLOGY HISTORY:  Oncology History  Adenocarcinoma of left lung (HCC)  03/19/2023 Initial Diagnosis   Adenocarcinoma of left lung (HCC)   03/19/2023 Cancer Staging   Staging form: Lung, AJCC 8th Edition - Clinical stage from 03/19/2023: Stage IVB (cTX, cN2, cM1c) - Signed by Jacobo Evalene PARAS, MD on 03/19/2023 Stage prefix: Initial diagnosis   05/07/2023 - 07/16/2023 Chemotherapy   Patient is on Treatment Plan : LUNG NSCLC Pembrolizumab  (200) q21d     02/03/2024 -  Chemotherapy   Patient is on Treatment Plan : LUNG NSCLC Carboplatin + Paclitaxel + Bevacizumab q21d       ALLERGIES:  is allergic to ezetimibe, codeine, cortisone, and lovastatin.  MEDICATIONS:  Current Outpatient Medications  Medication Sig Dispense Refill   aspirin  EC 81 MG tablet Take 162 mg by mouth daily. Swallow whole.     Biotin 10 MG CAPS  cholecalciferol  (VITAMIN D3) 25 MCG (1000 UNIT) tablet Take 1,000 Units by mouth daily.     diphenoxylate -atropine  (LOMOTIL ) 2.5-0.025 MG tablet Take 1 tablet by mouth 4 (four) times daily as needed for diarrhea or loose stools. 60 tablet 2   FIBER PO Take 1 tablet by mouth daily.     furosemide  (LASIX ) 40 MG tablet Take 40 mg by mouth daily. 40 mg alternating 80 mg every other day     ibuprofen (ADVIL) 200 MG tablet Take  400 mg by mouth every 6 (six) hours as needed for mild pain or moderate pain.     Ibuprofen-diphenhydrAMINE  HCl (IBUPROFEN PM) 200-25 MG CAPS Take 2 tablets by mouth at bedtime as needed (sleep).     LORazepam (ATIVAN) 0.5 MG tablet Take by mouth.     losartan  (COZAAR ) 100 MG tablet Take 100 mg by mouth daily.     magnesium oxide (MAG-OX) 400 (240 Mg) MG tablet Take 400 mg by mouth daily.     metoprolol  succinate (TOPROL -XL) 50 MG 24 hr tablet Take 50 mg by mouth daily.     omeprazole (PRILOSEC) 40 MG capsule Take 40 mg by mouth daily as needed.     ondansetron  (ZOFRAN ) 8 MG tablet Take 1 tablet (8 mg total) by mouth every 8 (eight) hours as needed for nausea or vomiting. 30 tablet 3   OVER THE COUNTER MEDICATION Take 1 capsule by mouth daily. CBD gummie     prochlorperazine  (COMPAZINE ) 10 MG tablet TAKE ONE TABLET BY MOUTH EVERY 6 HOURS AS NEEDED FOR NAUSEA / VOMITING 30 tablet 2   Tetrahydrozoline HCl (VISINE OP) Place 1 drop into both eyes daily as needed (dry eyes).     traZODone (DESYREL) 50 MG tablet Take 1 tablet by mouth at bedtime.     vitamin B-12 (CYANOCOBALAMIN ) 1000 MCG tablet Take 1,000 mcg by mouth daily.     No current facility-administered medications for this visit.    VITAL SIGNS: There were no vitals taken for this visit. There were no vitals filed for this visit.  Estimated body mass index is 33.2 kg/m as calculated from the following:   Height as of an earlier encounter on 02/11/24: 5' (1.524 m).   Weight as of an earlier encounter on 02/11/24: 170 lb (77.1 kg).  LABS: CBC:    Component Value Date/Time   WBC 30.8 (H) 02/11/2024 0828   HGB 12.1 02/11/2024 0828   HGB 12.3 02/03/2024 0843   HCT 37.7 02/11/2024 0828   PLT 308 02/11/2024 0828   PLT 554 (H) 02/03/2024 0843   MCV 72.1 (L) 02/11/2024 0828   NEUTROABS PENDING 02/11/2024 0828   LYMPHSABS PENDING 02/11/2024 0828   MONOABS PENDING 02/11/2024 0828   EOSABS PENDING 02/11/2024 0828   BASOSABS PENDING  02/11/2024 0828   Comprehensive Metabolic Panel:    Component Value Date/Time   NA 129 (L) 02/11/2024 0827   K 4.1 02/11/2024 0827   CL 92 (L) 02/11/2024 0827   CO2 26 02/11/2024 0827   BUN 20 02/11/2024 0827   CREATININE 0.94 02/11/2024 0827   GLUCOSE 119 (H) 02/11/2024 0827   CALCIUM 9.1 02/11/2024 0827   AST 27 02/11/2024 0827   ALT 18 02/11/2024 0827   ALKPHOS 185 (H) 02/11/2024 0827   BILITOT 0.2 02/11/2024 0827   PROT 7.1 02/11/2024 0827   ALBUMIN  3.6 02/11/2024 0827    RADIOGRAPHIC STUDIES: No results found.  PERFORMANCE STATUS (ECOG) : 1 - Symptomatic but completely ambulatory  Review of  Systems Unless otherwise noted, a complete review of systems is negative.  Physical Exam General: NAD Cardiovascular: regular rate and rhythm Pulmonary: clear ant fields Abdomen: soft, nontender, + bowel sounds GU: no suprapubic tenderness Extremities: no edema, no joint deformities Skin: no rashes Neurological: Weakness but otherwise nonfocal  IMPRESSION: Patient with stage IVb adenocarcinoma of the lung on treatment with CarboTaxol Avastin.  PET scan on 12/21/2023 showed progression of disease.  Patient remains in agreement with current scope of treatment.  Discussed that treatment is with palliative intent.  Symptomatically, she endorses right scapular pain.  She says this started at time of last chemotherapy but she attributes it to sitting 8 hours in the chair.  She says it is easily reproducible when she raises or moves her right arm.  She denies any swelling to the arm, chest pain, or shortness of breath.  Patient was started on Norco, which she says she has been taking 2 tablets every 4 hours with little improvement in symptoms.  Patient has also been using heat with little improvement.  PET scan reviewed and unclear if patient has disease involvement in this area.  There does appear to be a spinal met.  Discussed with Dr. Jacobo who plans to refer to radiation  oncology.  Will rotate from Norco to oxycodone .  Will start on Medrol  taper as this could be musculoskeletal versus cancer.  Also discussed possible option of the lidocaine  patch.  Patient says she has previously completed ACP documents.  I requested that she bring those in to be scanned into the chart.  I sent her home with a MOST form today.  She says she has not given much consideration to end-of-life decision making.  PLAN: -Continue current scope of treatment - DC Norco - Start oxycodone  5 to 10 mg every 4 hours as needed #45 - Start Medrol  Dosepak - Daily bowel regimen - Patient to bring in ACP documents to be scanned - MOST Form reviewed - Follow-up 2 weeks  Case and plan discussed Dr. Jacobo   Patient expressed understanding and was in agreement with this plan. She also understands that She can call the clinic at any time with any questions, concerns, or complaints.     Time Total: 20 minutes  Visit consisted of counseling and education dealing with the complex and emotionally intense issues of symptom management and palliative care in the setting of serious and potentially life-threatening illness.Greater than 50%  of this time was spent counseling and coordinating care related to the above assessment and plan.  Signed by: Fonda Mower, PhD, NP-C

## 2024-02-11 NOTE — Progress Notes (Signed)
 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  INTERVAL HISTORY: Patient returns to clinic today for repeat laboratory work and to assess her toleration of cycle 1 of carboplatin, Taxol, and Avastin.  She has significant upper back pain that started the day after chemotherapy that is different in intensity and location from the lower back pain/sciatica she was having last week.  She otherwise tolerated her treatment well without significant side effects.  She has no neurologic complaints.  She denies any recent fevers or illnesses. She has a good appetite and denies weight loss.  She denies any chest pain, shortness of breath, cough, or hemoptysis.  She denies any nausea, vomiting, constipation, or diarrhea.  She has no urinary complaints.  Patient offers no further specific complaints today.  REVIEW OF SYSTEMS:   Review of Systems  Constitutional: Negative.  Negative for fever, malaise/fatigue and weight loss.  Respiratory: Negative.  Negative for cough, hemoptysis and shortness of breath.   Cardiovascular: Negative.  Negative for chest pain and leg swelling.  Gastrointestinal: Negative.  Negative for abdominal pain.  Genitourinary: Negative.  Negative for dysuria.  Musculoskeletal:  Positive for back pain. Negative for joint pain.  Skin: Negative.  Negative for rash.  Neurological: Negative.  Negative for dizziness, focal weakness, weakness and headaches.  Psychiatric/Behavioral:  The patient is nervous/anxious.     As per HPI.  Otherwise, a complete review of systems is negative.  PAST MEDICAL HISTORY: Past Medical History:  Diagnosis Date   Acid reflux    Anemia    Anesthesia complication    Tachycardia previously, now on metoprolol    Arthritis    09/02/2019: per patient have it real bad in both hands and back   Difficult intubation    had to terminate intubation unable to advance tube for cholecystectomy 2017?   DVT (deep venous thrombosis) (HCC)    leg   Hypertension    Sciatica    09/02/2019: has had it for about 3 years    PAST SURGICAL HISTORY: Past Surgical History:  Procedure Laterality Date   ABDOMINAL HYSTERECTOMY     CATARACT EXTRACTION W/ INTRAOCULAR LENS IMPLANT Bilateral 2007   eyes done within 1 month of each other.   IR IMAGING GUIDED PORT INSERTION  05/19/2023   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   TOTAL HIP ARTHROPLASTY Right 09/06/2019   Procedure: RIGHT TOTAL HIP ARTHROPLASTY ANTERIOR APPROACH;  Surgeon: Vernetta Lonni GRADE, MD;  Location: [facility] OR;  Service: Orthopedics;  Laterality: Right;   TOTAL HIP ARTHROPLASTY Left 07/31/2020   Procedure: LEFT TOTAL HIP ARTHROPLASTY ANTERIOR APPROACH;  Surgeon: Vernetta Lonni GRADE, MD;  Location: [facility] OR;  Service: Orthopedics;  Laterality: Left;   VIDEO BRONCHOSCOPY WITH ENDOBRONCHIAL ULTRASOUND N/A 10/06/2022   Procedure: VIDEO BRONCHOSCOPY WITH ENDOBRONCHIAL ULTRASOUND;  Surgeon: Parris Manna, MD;  Location: [facility] ORS;  Service: Thoracic;  Laterality: N/A;    FAMILY HISTORY: Family History  Problem Relation Age of Onset   Breast cancer Sister 43    ADVANCED DIRECTIVES (Y/N):  N  HEALTH MAINTENANCE: Social  History   Tobacco Use   Smoking status: Former    Current packs/day: 0.00    Types: Cigarettes    Quit date: 01/02/1993    Years since quitting: 31.1   Smokeless tobacco: Never  Vaping Use   Vaping status: Never Used  Substance Use Topics   Alcohol use:  Yes    Alcohol/week: 4.0 standard drinks of alcohol    Types: 4 Glasses of wine per week    Comment: 09/02/2019: per patient ever so often   Drug use: No     Colonoscopy:  PAP:  Bone density:  Lipid panel:  Allergies  Allergen Reactions   Ezetimibe Other (See Comments)    Body aches, and stiffness   Codeine Diarrhea   Cortisone Swelling    injections   Lovastatin Other (See Comments)    Muscle cramps    Current Outpatient Medications  Medication Sig Dispense Refill   aspirin  EC 81 MG tablet Take 162 mg by mouth daily. Swallow whole.     Biotin 10 MG CAPS      cholecalciferol  (VITAMIN D3) 25 MCG (1000 UNIT) tablet Take 1,000 Units by mouth daily.     diphenoxylate -atropine  (LOMOTIL ) 2.5-0.025 MG tablet Take 1 tablet by mouth 4 (four) times daily as needed for diarrhea or loose stools. 60 tablet 2   FIBER PO Take 1 tablet by mouth daily.     furosemide  (LASIX ) 40 MG tablet Take 40 mg by mouth daily. 40 mg alternating 80 mg every other day     ibuprofen (ADVIL) 200 MG tablet Take 400 mg by mouth every 6 (six) hours as needed for mild pain or moderate pain.     Ibuprofen-diphenhydrAMINE  HCl (IBUPROFEN PM) 200-25 MG CAPS Take 2 tablets by mouth at bedtime as needed (sleep).     LORazepam (ATIVAN) 0.5 MG tablet Take by mouth.     losartan  (COZAAR ) 100 MG tablet Take 100 mg by mouth daily.     magnesium oxide (MAG-OX) 400 (240 Mg) MG tablet Take 400 mg by mouth daily.     metoprolol  succinate (TOPROL -XL) 50 MG 24 hr tablet Take 50 mg by mouth daily.     omeprazole (PRILOSEC) 40 MG capsule Take 40 mg by mouth daily as needed.     ondansetron  (ZOFRAN ) 8 MG tablet Take 1 tablet (8 mg total) by mouth every 8 (eight) hours as needed for nausea or vomiting. 30 tablet 3   OVER THE COUNTER MEDICATION Take 1 capsule by mouth daily. CBD gummie     prochlorperazine  (COMPAZINE ) 10 MG tablet TAKE ONE TABLET BY MOUTH EVERY 6 HOURS AS NEEDED FOR NAUSEA / VOMITING 30 tablet 2   Tetrahydrozoline  HCl (VISINE OP) Place 1 drop into both eyes daily as needed (dry eyes).     traZODone (DESYREL) 50 MG tablet Take 1 tablet by mouth at bedtime.     vitamin B-12 (CYANOCOBALAMIN ) 1000 MCG tablet Take 1,000 mcg by mouth daily.     methylPREDNISolone  (MEDROL  DOSEPAK) 4 MG TBPK tablet Start with 6 tablets for 1st day and then reduce by one tablet daily until completed. 21 each 0   oxyCODONE  (OXY IR/ROXICODONE ) 5 MG immediate release tablet Take 1-2 tablets (5-10 mg total) by mouth every 4 (four) hours as needed for severe pain (pain score 7-10). 45 tablet 0   No current facility-administered medications for this visit.    OBJECTIVE: Vitals:   02/11/24 0848  BP: (!) 146/77  Pulse: 85  Resp: 18  Temp:  98.2 F (36.8 C)  SpO2: 97%      Body mass index is 33.2 kg/m.    ECOG FS:0 - Asymptomatic  General: Well-developed, well-nourished, no acute distress.  Sitting in a wheelchair. Eyes: Pink conjunctiva, anicteric sclera. HEENT: Normocephalic, moist mucous membranes. Lungs: No audible wheezing or coughing. Heart: Regular rate and rhythm. Abdomen: Soft, nontender, no obvious distention. Musculoskeletal: No edema, cyanosis, or clubbing. Neuro: Alert, answering all questions appropriately. Cranial nerves grossly intact. Skin: No rashes or petechiae noted. Psych: Normal affect.  LAB RESULTS:  Lab Results  Component Value Date   NA 129 (L) 02/11/2024   K 4.1 02/11/2024   CL 92 (L) 02/11/2024   CO2 26 02/11/2024   GLUCOSE 119 (H) 02/11/2024   BUN 20 02/11/2024   CREATININE 0.94 02/11/2024   CALCIUM 9.1 02/11/2024   PROT 7.1 02/11/2024   ALBUMIN  3.6 02/11/2024   AST 27 02/11/2024   ALT 18 02/11/2024   ALKPHOS 185 (H) 02/11/2024   BILITOT 0.2 02/11/2024   GFRNONAA >60 02/11/2024   GFRAA >60 09/10/2019    Lab Results  Component Value Date   WBC 30.8 (H) 02/11/2024   NEUTROABS 21.6 (H) 02/11/2024   HGB 12.1 02/11/2024   HCT 37.7 02/11/2024   MCV 72.1 (L) 02/11/2024   PLT 308  02/11/2024     STUDIES: No results found.   ONCOLOGY HISTORY: Patient completed SBRT to the 2.7 cm irregular left lower lobe nodule in July 2024.  PET scan results from February 13, 2023 reviewed independently with residual low level of activity suggesting treatment response in the left lower lobe.  The AP window lymph node seen on subsequent CT scan revealed progressive enlargement now measuring 1.7 x 1.3 cm as well as increased hypermetabolic activity.  Patient completed XRT to this lesion.  Patient has also completed XRT to her left pelvis.  Patient initiated Keytruda  on May 07, 2023.  PET scan on July 08, 2023 revealed progressive disease and treatment was changed to Adagrasib  (Krazati ) 600 mg twice daily given her K-ras mutation.  MRI of the brain on August 22, 2023 revealed a 1 cm left lateral cerebellum lesion.  Patient has completed XRT to this lesion.  Her most recent PET scan on December 21, 2023 revealed progression of disease.   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  PET scan on December 21, 2023 revealed progression of disease.  Recommendation is to transition to chemotherapy using carboplatinum, Taxol, and Avastin every 3 weeks.  Patient received cycle 1 of treatment last week without significant side effects.  Return to clinic in 2 weeks as previously scheduled for further evaluation and consideration of cycle 2.  Will reimage with PET scan after the conclusion of cycle 4.   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  Repeat brain MRI in proximately November 2025. Hyponatremia: Patient's sodium levels have dropped to 129, monitor. Leukocytosis: Secondary to G-CSF. Thrombocytosis: Resolved.   Shortness of breath/cough: Patient does not complain of this  today. Hypertension: Chronic and unchanged.  Continue monitoring treatment per primary care. Lower back pain/sciatica: Patient now states her sciatica has resolved and the pain she is complaining today is different in location and intensity.  She has point tenderness above the right scapula and symptoms started acutely after receiving chemotherapy.  Likely musculoskeletal.  Patient does have a vague hypermetabolic lesion in approximately that area on her vertebrae, but not in the scapula.  Will treat with steroids and increase oxycodone  as per palliative care.  If no resolution at next clinic visit, will consider XRT.     Patient expressed understanding and was in agreement with this plan. She also understands that She can call clinic at any time with any questions, concerns, or complaints.    Cancer Staging  Adenocarcinoma of left lung [facility]) Staging form: Lung, AJCC 8th Edition - Clinical stage from 03/19/2023: Stage IVB (cTX, cN2, cM1c) - Signed by Jacobo Evalene PARAS, MD on 03/19/2023 Stage prefix: Initial diagnosis   Evalene PARAS Jacobo, MD   02/11/2024 1:22 PM

## 2024-02-11 NOTE — Progress Notes (Signed)
 Upper back and her right shin is very painful and she rates her pain at abut  8. Her pain is uncontrollable, the hydrocodone isn't helping. She is trying to drink one boost drink a day.    She wants to go to the beach tomorrow and she doesn't want to be in pain.

## 2024-02-15 ENCOUNTER — Telehealth: Payer: Self-pay | Admitting: *Deleted

## 2024-02-15 NOTE — Telephone Encounter (Signed)
 The patient called and said that she is very constipated and she did try got and it did not work and she is out town.  She wants me to ask Dr. Jacobo what over-the-counter medicine could help her while she is out of town.  He states that she can get MiraLAX and you can mix it in water, coffee, tea or soda.  You take the lid off of the MiraLAX you  will see a line in the lid.  And the lid is thing that is how much you have to put in there.  Sometimes it takes 6 to 8 hours to get a bowel movement but if it goes more than like 6 go ahead and do another MiraLAX.  Says she understands

## 2024-02-16 ENCOUNTER — Encounter: Payer: Self-pay | Admitting: Oncology

## 2024-02-22 ENCOUNTER — Other Ambulatory Visit: Payer: Self-pay | Admitting: *Deleted

## 2024-02-22 ENCOUNTER — Encounter: Payer: Self-pay | Admitting: Oncology

## 2024-02-22 DIAGNOSIS — G629 Polyneuropathy, unspecified: Secondary | ICD-10-CM

## 2024-02-22 MED ORDER — OXYCODONE HCL 5 MG PO TABS: 5.0000 mg | ORAL_TABLET | ORAL | 0 refills | Status: DC | PRN

## 2024-02-22 NOTE — Telephone Encounter (Signed)
Call returned to patient.

## 2024-02-24 ENCOUNTER — Ambulatory Visit
Admission: RE | Admit: 2024-02-24 | Discharge: 2024-02-24 | Disposition: A | Discharge status: Home / Self Care | Source: Ambulatory Visit | Attending: Radiation Oncology | Admitting: Radiation Oncology

## 2024-02-24 ENCOUNTER — Inpatient Hospital Stay (HOSPITAL_BASED_OUTPATIENT_CLINIC_OR_DEPARTMENT_OTHER): Admitting: Hospice and Palliative Medicine

## 2024-02-24 ENCOUNTER — Inpatient Hospital Stay (HOSPITAL_BASED_OUTPATIENT_CLINIC_OR_DEPARTMENT_OTHER): Admitting: Oncology

## 2024-02-24 ENCOUNTER — Inpatient Hospital Stay

## 2024-02-24 ENCOUNTER — Ambulatory Visit: Attending: Oncology | Admitting: Occupational Therapy

## 2024-02-24 ENCOUNTER — Encounter: Payer: Self-pay | Admitting: Oncology

## 2024-02-24 VITALS — BP 120/90 | HR 50 | Temp 99.7°F | Resp 16 | Ht 60.0 in | Wt 172.0 lb

## 2024-02-24 DIAGNOSIS — Z5112 Encounter for antineoplastic immunotherapy: Secondary | ICD-10-CM | POA: Diagnosis not present

## 2024-02-24 DIAGNOSIS — Z515 Encounter for palliative care: Secondary | ICD-10-CM

## 2024-02-24 DIAGNOSIS — C3492 Malignant neoplasm of unspecified part of left bronchus or lung: Secondary | ICD-10-CM

## 2024-02-24 DIAGNOSIS — C7951 Secondary malignant neoplasm of bone: Secondary | ICD-10-CM

## 2024-02-24 LAB — CMP (CANCER CENTER ONLY)
ALT: 16 U/L (ref 0–44)
AST: 21 U/L (ref 15–41)
Albumin: 2.7 g/dL — ABNORMAL LOW (ref 3.5–5.0)
Alkaline Phosphatase: 140 U/L — ABNORMAL HIGH (ref 38–126)
Anion gap: 11 (ref 5–15)
BUN: 22 mg/dL (ref 8–23)
CO2: 24 mmol/L (ref 22–32)
Calcium: 8.7 mg/dL — ABNORMAL LOW (ref 8.9–10.3)
Chloride: 93 mmol/L — ABNORMAL LOW (ref 98–111)
Creatinine: 0.85 mg/dL (ref 0.44–1.00)
GFR, Estimated: >60 mL/min (ref >60)
Glucose, Bld: 129 mg/dL — ABNORMAL HIGH (ref 70–99)
Potassium: 4.6 mmol/L (ref 3.5–5.1)
Sodium: 128 mmol/L — ABNORMAL LOW (ref 135–145)
Total Bilirubin: 0.4 mg/dL (ref 0.0–1.2)
Total Protein: 7 g/dL (ref 6.5–8.1)

## 2024-02-24 LAB — CBC WITH DIFFERENTIAL (CANCER CENTER ONLY)
Abs Immature Granulocytes: 0.55 K/uL — ABNORMAL HIGH (ref 0.00–0.07)
Basophils Absolute: 0.2 K/uL — ABNORMAL HIGH (ref 0.0–0.1)
Basophils Relative: 1 %
Eosinophils Absolute: 0 K/uL (ref 0.0–0.5)
Eosinophils Relative: 0 %
HCT: 35.5 % — ABNORMAL LOW (ref 36.0–46.0)
Hemoglobin: 11.3 g/dL — ABNORMAL LOW (ref 12.0–15.0)
Immature Granulocytes: 3 %
Lymphocytes Relative: 6 %
Lymphs Abs: 1.3 K/uL (ref 0.7–4.0)
MCH: 23.6 pg — ABNORMAL LOW (ref 26.0–34.0)
MCHC: 31.8 g/dL (ref 30.0–36.0)
MCV: 74.3 fL — ABNORMAL LOW (ref 80.0–100.0)
Monocytes Absolute: 2.4 K/uL — ABNORMAL HIGH (ref 0.1–1.0)
Monocytes Relative: 12 %
Neutro Abs: 16.1 K/uL — ABNORMAL HIGH (ref 1.7–7.7)
Neutrophils Relative %: 78 %
Platelet Count: 614 K/uL — ABNORMAL HIGH (ref 150–400)
RBC: 4.78 MIL/uL (ref 3.87–5.11)
RDW: 20.6 % — ABNORMAL HIGH (ref 11.5–15.5)
WBC Count: 20.6 K/uL — ABNORMAL HIGH (ref 4.0–10.5)
nRBC: 0 % (ref 0.0–0.2)

## 2024-02-24 MED ORDER — APREPITANT 130 MG/18ML IV EMUL
130.0000 mg | Freq: Once | INTRAVENOUS | Status: AC
  Administered 2024-02-24: 130 mg via INTRAVENOUS
  Filled 2024-02-24: qty 18

## 2024-02-24 MED ORDER — SODIUM CHLORIDE 0.9 % IV SOLN
498.0000 mg | Freq: Once | INTRAVENOUS | Status: AC
  Administered 2024-02-24: 500 mg via INTRAVENOUS
  Filled 2024-02-24: qty 50

## 2024-02-24 MED ORDER — DULOXETINE HCL 20 MG PO CPEP: 20.0000 mg | ORAL_CAPSULE | Freq: Every day | ORAL | 3 refills | Status: DC

## 2024-02-24 MED ORDER — SODIUM CHLORIDE 0.9 % IV SOLN
INTRAVENOUS | Status: DC
  Filled 2024-02-24: qty 250

## 2024-02-24 MED ORDER — SODIUM CHLORIDE 0.9 % IV SOLN
175.0000 mg/m2 | Freq: Once | INTRAVENOUS | Status: AC
  Administered 2024-02-24: 324 mg via INTRAVENOUS
  Filled 2024-02-24: qty 54

## 2024-02-24 MED ORDER — DIPHENHYDRAMINE HCL 50 MG/ML IJ SOLN
25.0000 mg | Freq: Once | INTRAMUSCULAR | Status: AC
  Administered 2024-02-24: 25 mg via INTRAVENOUS
  Filled 2024-02-24: qty 1

## 2024-02-24 MED ORDER — FAMOTIDINE IN NACL 20-0.9 MG/50ML-% IV SOLN
20.0000 mg | Freq: Once | INTRAVENOUS | Status: AC
  Administered 2024-02-24: 20 mg via INTRAVENOUS
  Filled 2024-02-24: qty 50

## 2024-02-24 MED ORDER — DEXAMETHASONE SOD PHOSPHATE PF 10 MG/ML IJ SOLN
10.0000 mg | Freq: Once | INTRAMUSCULAR | Status: AC
  Administered 2024-02-24: 10 mg via INTRAVENOUS

## 2024-02-24 MED ORDER — PALONOSETRON HCL INJECTION 0.25 MG/5ML
0.2500 mg | Freq: Once | INTRAVENOUS | Status: AC
  Administered 2024-02-24: 0.25 mg via INTRAVENOUS
  Filled 2024-02-24: qty 5

## 2024-02-24 MED ORDER — SODIUM CHLORIDE 0.9 % IV SOLN
15.0000 mg/kg | Freq: Once | INTRAVENOUS | Status: AC
  Administered 2024-02-24: 1200 mg via INTRAVENOUS
  Filled 2024-02-24: qty 48

## 2024-02-24 NOTE — Progress Notes (Unsigned)
 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 She isn't eating much due to her teeth being so sensitive.

## 2024-02-24 NOTE — Patient Instructions (Signed)
 [facility] - A DEPT OF [facility]  Discharge Instructions: Thank you for choosing [facility] to provide your oncology and hematology care.  If you have a lab appointment with the Cancer Center, please go directly to the Cancer Center and check in at the registration area.  Wear comfortable clothing and clothing appropriate for easy access to any Portacath or PICC line.   We strive to give you quality time with your provider. You may need to reschedule your appointment if you arrive late (15 or more minutes).  Arriving late affects you and other patients whose appointments are after yours.  Also, if you miss three or more appointments without notifying the office, you may be dismissed from the clinic at the provider's discretion.      For prescription refill requests, have your pharmacy contact our office and allow 72 hours for refills to be completed.    Today you received the following chemotherapy and/or immunotherapy agents TAXOL and CARBOPLATIN       To help prevent nausea and vomiting after your treatment, we encourage you to take your nausea medication as directed.  BELOW ARE SYMPTOMS THAT SHOULD BE REPORTED IMMEDIATELY: *FEVER GREATER THAN 100.4 F (38 C) OR HIGHER *CHILLS OR SWEATING *NAUSEA AND VOMITING THAT IS NOT CONTROLLED WITH YOUR NAUSEA MEDICATION *UNUSUAL SHORTNESS OF BREATH *UNUSUAL BRUISING OR BLEEDING *URINARY PROBLEMS (pain or burning when urinating, or frequent urination) *BOWEL PROBLEMS (unusual diarrhea, constipation, pain near the anus) TENDERNESS IN MOUTH AND THROAT WITH OR WITHOUT PRESENCE OF ULCERS (sore throat, sores in mouth, or a toothache) UNUSUAL RASH, SWELLING OR PAIN  UNUSUAL VAGINAL DISCHARGE OR ITCHING   Items with * indicate a potential emergency and should be followed up as soon as possible or go to the Emergency Department if any problems should occur.  Please show the CHEMOTHERAPY ALERT CARD or  IMMUNOTHERAPY ALERT CARD at check-in to the Emergency Department and triage nurse.  Should you have questions after your visit or need to cancel or reschedule your appointment, please contact [facility] - A DEPT OF [facility]  [phone] and follow the prompts.  Office hours are 8:00 a.m. to 4:30 p.m. Monday - Friday. Please note that voicemails left after 4:00 p.m. may not be returned until the following business day.  We are closed weekends and major holidays. You have access to a nurse at all times for urgent questions. Please call the main number to the clinic [phone] and follow the prompts.  For any non-urgent questions, you may also contact your provider using MyChart. We now offer e-Visits for anyone 83 and older to request care online for non-urgent symptoms. For details visit [email or web address].   Also download the MyChart app! Go to the app store, search "MyChart", open the app, select [facility], and log in with your MyChart username and password.  Paclitaxel Injection What is this medication? PACLITAXEL (PAK li TAX el) treats some types of cancer. It works by slowing down the growth of cancer cells. This medicine may be used for other purposes; ask your health care provider or pharmacist if you have questions. COMMON BRAND NAME(S): Onxol, Taxol What should I tell my care team before I take this medication? They need to know if you have any of these conditions: Heart disease Liver disease Low white blood cell levels An unusual or allergic reaction to paclitaxel, other medications, foods, dyes, or preservatives  If you or your partner are pregnant or trying to get pregnant Breast-feeding How should I use this medication? This medication is injected into a vein. It is given by your care team in a hospital or clinic setting. Talk to your care team about the use of this medication in children. While it may be given to children for selected  conditions, precautions do apply. Overdosage: If you think you have taken too much of this medicine contact a poison control center or emergency room at once. NOTE: This medicine is only for you. Do not share this medicine with others. What if I miss a dose? Keep appointments for follow-up doses. It is important not to miss your dose. Call your care team if you are unable to keep an appointment. What may interact with this medication? Do not take this medication with any of the following: Live virus vaccines Other medications may affect the way this medication works. Talk with your care team about all of the medications you take. They may suggest changes to your treatment plan to lower the risk of side effects and to make sure your medications work as intended. This list may not describe all possible interactions. Give your health care provider a list of all the medicines, herbs, non-prescription drugs, or dietary supplements you use. Also tell them if you smoke, drink alcohol, or use illegal drugs. Some items may interact with your medicine. What should I watch for while using this medication? Your condition will be monitored carefully while you are receiving this medication. You may need blood work while taking this medication. This medication may make you feel generally unwell. This is not uncommon as chemotherapy can affect healthy cells as well as cancer cells. Report any side effects. Continue your course of treatment even though you feel ill unless your care team tells you to stop. This medication can cause serious allergic reactions. To reduce the risk, your care team may give you other medications to take before receiving this one. Be sure to follow the directions from your care team. This medication may increase your risk of getting an infection. Call your care team for advice if you get a fever, chills, sore throat, or other symptoms of a cold or flu. Do not treat yourself. Try to avoid  being around people who are sick. This medication may increase your risk to bruise or bleed. Call your care team if you notice any unusual bleeding. Be careful brushing or flossing your teeth or using a toothpick because you may get an infection or bleed more easily. If you have any dental work done, tell your dentist you are receiving this medication. Talk to your care team if you may be pregnant. Serious birth defects can occur if you take this medication during pregnancy. Talk to your care team before breastfeeding. Changes to your treatment plan may be needed. What side effects may I notice from receiving this medication? Side effects that you should report to your care team as soon as possible: Allergic reactions--skin rash, itching, hives, swelling of the face, lips, tongue, or throat Heart rhythm changes--fast or irregular heartbeat, dizziness, feeling faint or lightheaded, chest pain, trouble breathing Increase in blood pressure Infection--fever, chills, cough, sore throat, wounds that don't heal, pain or trouble when passing urine, general feeling of discomfort or being unwell Low blood pressure--dizziness, feeling faint or lightheaded, blurry vision Low red blood cell level--unusual weakness or fatigue, dizziness, headache, trouble breathing Painful swelling, warmth, or redness of the skin, blisters  or sores at the infusion site Pain, tingling, or numbness in the hands or feet Slow heartbeat--dizziness, feeling faint or lightheaded, confusion, trouble breathing, unusual weakness or fatigue Unusual bruising or bleeding Side effects that usually do not require medical attention (report to your care team if they continue or are bothersome): Diarrhea Hair loss Joint pain Loss of appetite Muscle pain Nausea Vomiting This list may not describe all possible side effects. Call your doctor for medical advice about side effects. You may report side effects to FDA at 1-800-FDA-1088. Where  should I keep my medication? This medication is given in a hospital or clinic. It will not be stored at home. NOTE: This sheet is a summary. It may not cover all possible information. If you have questions about this medicine, talk to your doctor, pharmacist, or health care provider.  2024 Elsevier/Gold Standard (2021-09-10 00:00:00)  Carboplatin Injection What is this medication? CARBOPLATIN (KAR boe pla tin) treats some types of cancer. It works by slowing down the growth of cancer cells. This medicine may be used for other purposes; ask your health care provider or pharmacist if you have questions. COMMON BRAND NAME(S): Paraplatin What should I tell my care team before I take this medication? They need to know if you have any of these conditions: Blood disorders Hearing problems Kidney disease Recent or ongoing radiation therapy An unusual or allergic reaction to carboplatin, cisplatin, other medications, foods, dyes, or preservatives Pregnant or trying to get pregnant Breast-feeding How should I use this medication? This medication is injected into a vein. It is given by your care team in a hospital or clinic setting. Talk to your care team about the use of this medication in children. Special care may be needed. Overdosage: If you think you have taken too much of this medicine contact a poison control center or emergency room at once. NOTE: This medicine is only for you. Do not share this medicine with others. What if I miss a dose? Keep appointments for follow-up doses. It is important not to miss your dose. Call your care team if you are unable to keep an appointment. What may interact with this medication? Medications for seizures Some antibiotics, such as amikacin, gentamicin, neomycin, streptomycin, tobramycin Vaccines This list may not describe all possible interactions. Give your health care provider a list of all the medicines, herbs, non-prescription drugs, or dietary  supplements you use. Also tell them if you smoke, drink alcohol, or use illegal drugs. Some items may interact with your medicine. What should I watch for while using this medication? Your condition will be monitored carefully while you are receiving this medication. You may need blood work while taking this medication. This medication may make you feel generally unwell. This is not uncommon, as chemotherapy can affect healthy cells as well as cancer cells. Report any side effects. Continue your course of treatment even though you feel ill unless your care team tells you to stop. In some cases, you may be given additional medications to help with side effects. Follow all directions for their use. This medication may increase your risk of getting an infection. Call your care team for advice if you get a fever, chills, sore throat, or other symptoms of a cold or flu. Do not treat yourself. Try to avoid being around people who are sick. Avoid taking medications that contain aspirin, acetaminophen, ibuprofen, naproxen, or ketoprofen unless instructed by your care team. These medications may hide a fever. Be careful brushing or flossing your  teeth or using a toothpick because you may get an infection or bleed more easily. If you have any dental work done, tell your dentist you are receiving this medication. Talk to your care team if you wish to become pregnant or think you might be pregnant. This medication can cause serious birth defects. Talk to your care team about effective forms of contraception. Do not breast-feed while taking this medication. What side effects may I notice from receiving this medication? Side effects that you should report to your care team as soon as possible: Allergic reactions--skin rash, itching, hives, swelling of the face, lips, tongue, or throat Infection--fever, chills, cough, sore throat, wounds that don't heal, pain or trouble when passing urine, general feeling of  discomfort or being unwell Low red blood cell level--unusual weakness or fatigue, dizziness, headache, trouble breathing Pain, tingling, or numbness in the hands or feet, muscle weakness, change in vision, confusion or trouble speaking, loss of balance or coordination, trouble walking, seizures Unusual bruising or bleeding Side effects that usually do not require medical attention (report to your care team if they continue or are bothersome): Hair loss Nausea Unusual weakness or fatigue Vomiting This list may not describe all possible side effects. Call your doctor for medical advice about side effects. You may report side effects to FDA at 1-800-FDA-1088. Where should I keep my medication? This medication is given in a hospital or clinic. It will not be stored at home. NOTE: This sheet is a summary. It may not cover all possible information. If you have questions about this medicine, talk to your doctor, pharmacist, or health care provider.  2024 Elsevier/Gold Standard (2021-08-13 00:00:00)

## 2024-02-24 NOTE — Progress Notes (Unsigned)
 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  INTERVAL HISTORY: Patient returns to clinic today for repeat laboratory work, further evaluation, consideration of cycle 2 of carboplatin, Taxol, and Avastin.  She has a mild peripheral neuropathy in the bottoms of both feet also complains of significant left leg pain with walking.  Her pain not evident if she is not moving.  Her scapular pain has resolved.  She has no neurologic complaints.  She denies any recent fevers or illnesses. She has a good appetite and denies weight loss.  She denies any chest pain, shortness of breath, cough, or hemoptysis.  She denies any nausea, vomiting, constipation, or diarrhea.  She has no urinary complaints.  Patient offers no further specific complaints today.  REVIEW OF SYSTEMS:   Review of Systems  Constitutional: Negative.  Negative for fever, malaise/fatigue and weight loss.  Respiratory: Negative.  Negative for cough, hemoptysis and shortness of breath.   Cardiovascular: Negative.  Negative for chest pain and leg swelling.  Gastrointestinal: Negative.  Negative for abdominal pain.  Genitourinary: Negative.  Negative for dysuria.  Musculoskeletal:  Positive for back pain. Negative for joint pain.  Skin: Negative.  Negative for rash.  Neurological:  Positive for sensory change. Negative for dizziness, focal weakness, weakness and headaches.  Psychiatric/Behavioral:  The patient is nervous/anxious.     As per HPI. Otherwise, a complete review of systems  is negative.  PAST MEDICAL HISTORY: Past Medical History:  Diagnosis Date   Acid reflux    Anemia    Anesthesia complication    Tachycardia previously, now on metoprolol    Arthritis    09/02/2019: per patient have it real bad in both hands and back   Difficult intubation    had to terminate intubation unable to advance tube for cholecystectomy 2017?   DVT (deep venous thrombosis) (HCC)    leg   Hypertension    Sciatica    09/02/2019: has had it for about 3 years    PAST SURGICAL HISTORY: Past Surgical History:  Procedure Laterality Date   ABDOMINAL HYSTERECTOMY     CATARACT EXTRACTION W/ INTRAOCULAR LENS IMPLANT Bilateral 2007   eyes done within 1 month of each other.   IR IMAGING GUIDED PORT INSERTION  05/19/2023   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   TOTAL HIP ARTHROPLASTY Right 09/06/2019   Procedure: RIGHT TOTAL HIP ARTHROPLASTY ANTERIOR APPROACH;  Surgeon: Vernetta Lonni GRADE, MD;  Location: [facility] OR;  Service: Orthopedics;  Laterality: Right;   TOTAL HIP ARTHROPLASTY Left 07/31/2020   Procedure: LEFT TOTAL HIP ARTHROPLASTY ANTERIOR APPROACH;  Surgeon: Vernetta Lonni GRADE, MD;  Location: [facility] OR;  Service: Orthopedics;  Laterality: Left;   VIDEO BRONCHOSCOPY WITH ENDOBRONCHIAL ULTRASOUND N/A 10/06/2022   Procedure: VIDEO BRONCHOSCOPY WITH ENDOBRONCHIAL ULTRASOUND;  Surgeon: Parris Manna, MD;  Location: [facility] ORS;  Service: Thoracic;  Laterality: N/A;    FAMILY HISTORY: Family History  Problem Relation Age of Onset   Breast cancer Sister 35    ADVANCED DIRECTIVES (Y/N):  N  HEALTH MAINTENANCE: Social History  Tobacco Use   Smoking status: Former    Current packs/day: 0.00    Types: Cigarettes    Quit date: 01/02/1993    Years since quitting: 31.1   Smokeless tobacco: Never  Vaping Use   Vaping status: Never Used  Substance Use Topics   Alcohol use: Yes    Alcohol/week: 4.0 standard  drinks of alcohol    Types: 4 Glasses of wine per week    Comment: 09/02/2019: per patient ever so often   Drug use: No     Colonoscopy:  PAP:  Bone density:  Lipid panel:  Allergies  Allergen Reactions   Ezetimibe Other (See Comments)    Body aches, and stiffness   Codeine Diarrhea   Cortisone Swelling    injections   Lovastatin Other (See Comments)    Muscle cramps    Current Outpatient Medications  Medication Sig Dispense Refill   aspirin  EC 81 MG tablet Take 162 mg by mouth daily. Swallow whole.     Biotin 10 MG CAPS      cholecalciferol  (VITAMIN D3) 25 MCG (1000 UNIT) tablet Take 1,000 Units by mouth daily.     diphenoxylate -atropine  (LOMOTIL ) 2.5-0.025 MG tablet Take 1 tablet by mouth 4 (four) times daily as needed for diarrhea or loose stools. 60 tablet 2   FIBER PO Take 1 tablet by mouth daily.     furosemide  (LASIX ) 40 MG tablet Take 40 mg by mouth daily. 40 mg alternating 80 mg every other day     ibuprofen (ADVIL) 200 MG tablet Take 400 mg by mouth every 6 (six) hours as needed for mild pain or moderate pain.     Ibuprofen-diphenhydrAMINE  HCl (IBUPROFEN PM) 200-25 MG CAPS Take 2 tablets by mouth at bedtime as needed (sleep).     LORazepam (ATIVAN) 0.5 MG tablet Take by mouth.     losartan  (COZAAR ) 100 MG tablet Take 100 mg by mouth daily.     magnesium oxide (MAG-OX) 400 (240 Mg) MG tablet Take 400 mg by mouth daily.     methylPREDNISolone  (MEDROL  DOSEPAK) 4 MG TBPK tablet Start with 6 tablets for 1st day and then reduce by one tablet daily until completed. 21 each 0   metoprolol  succinate (TOPROL -XL) 50 MG 24 hr tablet Take 50 mg by mouth daily.     omeprazole (PRILOSEC) 40 MG capsule Take 40 mg by mouth daily as needed.     ondansetron  (ZOFRAN ) 8 MG tablet Take 1 tablet (8 mg total) by mouth every 8 (eight) hours as needed for nausea or vomiting. 30 tablet 3   OVER THE COUNTER MEDICATION Take 1 capsule by mouth daily. CBD gummie     oxyCODONE  (OXY IR/ROXICODONE )  5 MG immediate release tablet Take 1-2 tablets (5-10 mg total) by mouth every 4 (four) hours as needed for severe pain (pain score 7-10). 60 tablet 0   prochlorperazine  (COMPAZINE ) 10 MG tablet TAKE ONE TABLET BY MOUTH EVERY 6 HOURS AS NEEDED FOR NAUSEA / VOMITING 30 tablet 2   Tetrahydrozoline HCl (VISINE OP) Place 1 drop into both eyes daily as needed (dry eyes).     vitamin B-12 (CYANOCOBALAMIN ) 1000 MCG tablet Take 1,000 mcg by mouth daily.     DULoxetine (CYMBALTA) 20 MG capsule Take 1 capsule (20 mg total) by mouth daily. 30 capsule 3   No current facility-administered medications for this visit.    OBJECTIVE: Vitals:   02/24/24 0921  BP: (!) 120/90  Pulse: (!) 50  Resp: 16  Temp: 99.7 F (37.6 C)  SpO2: 93%      Body mass index is 33.59 kg/m.    ECOG FS:1 - Symptomatic but completely ambulatory  General: Well-developed, well-nourished, no acute distress.  Sitting in a wheelchair. Eyes: Pink conjunctiva, anicteric sclera. HEENT: Normocephalic, moist mucous membranes. Lungs: No audible wheezing or coughing. Heart: Regular rate and rhythm. Abdomen: Soft, nontender, no obvious distention. Musculoskeletal: No edema, cyanosis, or clubbing. Neuro: Alert, answering all questions appropriately. Cranial nerves grossly intact. Skin: No rashes or petechiae noted. Psych: Normal affect.  LAB RESULTS:  Lab Results  Component Value Date   NA 128 (L) 02/24/2024   K 4.6 02/24/2024   CL 93 (L) 02/24/2024   CO2 24 02/24/2024   GLUCOSE 129 (H) 02/24/2024   BUN 22 02/24/2024   CREATININE 0.85 02/24/2024   CALCIUM 8.7 (L) 02/24/2024   PROT 7.0 02/24/2024   ALBUMIN  2.7 (L) 02/24/2024   AST 21 02/24/2024   ALT 16 02/24/2024   ALKPHOS 140 (H) 02/24/2024   BILITOT 0.4 02/24/2024   GFRNONAA >60 02/24/2024   GFRAA >60 09/10/2019    Lab Results  Component Value Date   WBC 20.6 (H) 02/24/2024   NEUTROABS 16.1 (H) 02/24/2024   HGB 11.3 (L) 02/24/2024   HCT 35.5 (L) 02/24/2024    MCV 74.3 (L) 02/24/2024   PLT 614 (H) 02/24/2024     STUDIES: No results found.   ONCOLOGY HISTORY: Patient completed SBRT to the 2.7 cm irregular left lower lobe nodule in July 2024.  PET scan results from February 13, 2023 reviewed independently with residual low level of activity suggesting treatment response in the left lower lobe.  The AP window lymph node seen on subsequent CT scan revealed progressive enlargement now measuring 1.7 x 1.3 cm as well as increased hypermetabolic activity.  Patient completed XRT to this lesion.  Patient has also completed XRT to her left pelvis.  Patient initiated Keytruda  on May 07, 2023.  PET scan on July 08, 2023 revealed progressive disease and treatment was changed to Adagrasib  (Krazati ) 600 mg twice daily given her K-ras mutation.  MRI of the brain on August 22, 2023 revealed a 1 cm left lateral cerebellum lesion.  Patient has completed XRT to this lesion.  Her most recent PET scan on December 21, 2023 revealed progression of disease.   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  PET scan on December 21, 2023 revealed progression of disease.  Recommendation is to transition to chemotherapy using carboplatinum, Taxol, and Avastin every 3 weeks.  Proceed with cycle 2 of treatment today.  Return to clinic in 2 days for G-CSF support and then in 3 weeks for further evaluation and consideration of cycle 3.  Will reimage with PET scan after the conclusion of cycle 4.   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  Repeat brain MRI in proximately November 2025. Hyponatremia: Chronic and unchanged.  Patient sodium was 128. Leukocytosis: Secondary to G-CSF. Anemia: Mild, monitor. Thrombocytosis: Likely reactive, monitor. Shortness of breath/cough: Patient  does not complain of this today. Hypertension: Mild.  Continue monitoring treatment per primary care. Lower back pain/sciatica: Patient's pain today states it is different than her sciatica or the point tenderness she was having previously in her right scapula.  Appears to be  musculoskeletal.  She has an appointment with occupational therapy today.  Continue oxycodone  as prescribed.  Appreciate palliative care input.   Neuropathy: Patient was given a prescription for Cymbalta.   Patient expressed understanding and was in agreement with this plan. She also understands that She can call clinic at any time with any questions, concerns, or complaints.    Cancer Staging  Adenocarcinoma of left lung [facility]) Staging form: Lung, AJCC 8th Edition - Clinical stage from 03/19/2023: Stage IVB (cTX, cN2, cM1c) - Signed by Jacobo Evalene PARAS, MD on 03/19/2023 Stage prefix: Initial diagnosis   Evalene PARAS Jacobo, MD   02/25/2024 6:31 AM

## 2024-02-24 NOTE — Progress Notes (Signed)
 Palliative Medicine [facility] at [facility] Telephone:(336) [phone] [phone]   Name: Hannah Yu Date: 02/24/2024 MRN: [id number]  DOB: March 27, 1946  Patient Care Team: Epifanio Alm SQUIBB, MD as PCP - General (Infectious Diseases) Verdene Gills, RN as Oncology Nurse Navigator Lenn Aran, MD as Consulting Physician (Radiation Oncology) Jacobo Evalene PARAS, MD as Consulting Physician (Oncology)    REASON FOR CONSULTATION: Hannah Yu is a 78 y.o. female with multiple medical problems including stage IVb adenocarcinoma of the lung.  Palliative care was consulted to address goals of manage ongoing symptoms.  SOCIAL HISTORY:     reports that she quit smoking about 31 years ago. Her smoking use included cigarettes. She has never used smokeless tobacco. She reports current alcohol use of about 4.0 standard drinks of alcohol per week. She reports that she does not use drugs.  Patient is unmarried.  Has no children.  Lives at home with her brother.  Patient previously worked in office.  ADVANCE DIRECTIVES:  Not on file  CODE STATUS:   PAST MEDICAL HISTORY: Past Medical History:  Diagnosis Date   Acid reflux    Anemia    Anesthesia complication    Tachycardia previously, now on metoprolol    Arthritis    09/02/2019: per patient have it real bad in both hands and back   Difficult intubation    had to terminate intubation unable to advance tube for cholecystectomy 2017?   DVT (deep venous thrombosis) (HCC)    leg   Hypertension    Sciatica    09/02/2019: has had it for about 3 years    PAST SURGICAL HISTORY:  Past Surgical History:  Procedure Laterality Date   ABDOMINAL HYSTERECTOMY     CATARACT EXTRACTION W/ INTRAOCULAR LENS IMPLANT Bilateral 2007   eyes done within 1 month of each other.   IR IMAGING GUIDED PORT INSERTION  05/19/2023   SHOULDER ARTHROSCOPY WITH OPEN ROTATOR CUFF REPAIR Right 2012   TOTAL HIP  ARTHROPLASTY Right 09/06/2019   TOTAL HIP ARTHROPLASTY Right 09/06/2019   Procedure: RIGHT TOTAL HIP ARTHROPLASTY ANTERIOR APPROACH;  Surgeon: Vernetta Lonni GRADE, MD;  Location: [facility] OR;  Service: Orthopedics;  Laterality: Right;   TOTAL HIP ARTHROPLASTY Left 07/31/2020   Procedure: LEFT TOTAL HIP ARTHROPLASTY ANTERIOR APPROACH;  Surgeon: Vernetta Lonni GRADE, MD;  Location: [facility] OR;  Service: Orthopedics;  Laterality: Left;   VIDEO BRONCHOSCOPY WITH ENDOBRONCHIAL ULTRASOUND N/A 10/06/2022   Procedure: VIDEO BRONCHOSCOPY WITH ENDOBRONCHIAL ULTRASOUND;  Surgeon: Parris Manna, MD;  Location: [facility] ORS;  Service: Thoracic;  Laterality: N/A;    HEMATOLOGY/ONCOLOGY HISTORY:  Oncology History  Adenocarcinoma of left lung (HCC)  03/19/2023 Initial Diagnosis   Adenocarcinoma of left lung (HCC)   03/19/2023 Cancer Staging   Staging form: Lung, AJCC 8th Edition - Clinical stage from 03/19/2023: Stage IVB (cTX, cN2, cM1c) - Signed by Jacobo Evalene PARAS, MD on 03/19/2023 Stage prefix: Initial diagnosis   05/07/2023 - 07/16/2023 Chemotherapy   Patient is on Treatment Plan : LUNG NSCLC Pembrolizumab  (200) q21d     02/03/2024 -  Chemotherapy   Patient is on Treatment Plan : LUNG NSCLC Carboplatin + Paclitaxel + Bevacizumab q21d       ALLERGIES:  is allergic to ezetimibe, codeine, cortisone, and lovastatin.  MEDICATIONS:  Current Outpatient Medications  Medication Sig Dispense Refill   DULoxetine (CYMBALTA) 20 MG capsule Take 1 capsule (20 mg total) by mouth daily. 30 capsule 3   aspirin  EC 81 MG  tablet Take 162 mg by mouth daily. Swallow whole.     Biotin 10 MG CAPS      cholecalciferol  (VITAMIN D3) 25 MCG (1000 UNIT) tablet Take 1,000 Units by mouth daily.     diphenoxylate -atropine  (LOMOTIL ) 2.5-0.025 MG tablet Take 1 tablet by mouth 4 (four) times daily as needed for diarrhea or loose stools. 60 tablet 2   FIBER PO Take 1 tablet by mouth daily.     furosemide  (LASIX ) 40 MG tablet Take 40 mg  by mouth daily. 40 mg alternating 80 mg every other day     ibuprofen (ADVIL) 200 MG tablet Take 400 mg by mouth every 6 (six) hours as needed for mild pain or moderate pain.     Ibuprofen-diphenhydrAMINE  HCl (IBUPROFEN PM) 200-25 MG CAPS Take 2 tablets by mouth at bedtime as needed (sleep).     LORazepam (ATIVAN) 0.5 MG tablet Take by mouth.     losartan  (COZAAR ) 100 MG tablet Take 100 mg by mouth daily.     magnesium oxide (MAG-OX) 400 (240 Mg) MG tablet Take 400 mg by mouth daily.     methylPREDNISolone  (MEDROL  DOSEPAK) 4 MG TBPK tablet Start with 6 tablets for 1st day and then reduce by one tablet daily until completed. 21 each 0   metoprolol  succinate (TOPROL -XL) 50 MG 24 hr tablet Take 50 mg by mouth daily.     omeprazole (PRILOSEC) 40 MG capsule Take 40 mg by mouth daily as needed.     ondansetron  (ZOFRAN ) 8 MG tablet Take 1 tablet (8 mg total) by mouth every 8 (eight) hours as needed for nausea or vomiting. 30 tablet 3   OVER THE COUNTER MEDICATION Take 1 capsule by mouth daily. CBD gummie     oxyCODONE  (OXY IR/ROXICODONE ) 5 MG immediate release tablet Take 1-2 tablets (5-10 mg total) by mouth every 4 (four) hours as needed for severe pain (pain score 7-10). 60 tablet 0   prochlorperazine  (COMPAZINE ) 10 MG tablet TAKE ONE TABLET BY MOUTH EVERY 6 HOURS AS NEEDED FOR NAUSEA / VOMITING 30 tablet 2   Tetrahydrozoline HCl (VISINE OP) Place 1 drop into both eyes daily as needed (dry eyes).     vitamin B-12 (CYANOCOBALAMIN ) 1000 MCG tablet Take 1,000 mcg by mouth daily.     No current facility-administered medications for this visit.   Facility-Administered Medications Ordered in Other Visits  Medication Dose Route Frequency Provider Last Rate Last Admin   0.9 %  sodium chloride  infusion   Intravenous Continuous Finnegan, Timothy J, MD 10 mL/hr at 02/24/24 1024 New Bag at 02/24/24 1024   aprepitant (CINVANTI) injection 130 mg  130 mg Intravenous Once Finnegan, Timothy J, MD        bevacizumab-adcd (VEGZELMA) 1,200 mg in sodium chloride  0.9 % 100 mL chemo infusion  15 mg/kg (Treatment Plan Recorded) Intravenous Once Finnegan, Timothy J, MD       CARBOplatin (PARAPLATIN) 500 mg in sodium chloride  0.9 % 250 mL chemo infusion  500 mg Intravenous Once Finnegan, Timothy J, MD       dexamethasone  (DECADRON ) injection 10 mg  10 mg Intravenous Once Finnegan, Timothy J, MD       diphenhydrAMINE  (BENADRYL ) injection 25 mg  25 mg Intravenous Once Finnegan, Timothy J, MD       famotidine (PEPCID) IVPB 20 mg premix  20 mg Intravenous Once Finnegan, Timothy J, MD       PACLitaxel (TAXOL) 324 mg in sodium chloride  0.9 % 500 mL chemo infusion (>  80mg /m2)  175 mg/m2 (Treatment Plan Recorded) Intravenous Once Finnegan, Timothy J, MD       palonosetron (ALOXI) injection 0.25 mg  0.25 mg Intravenous Once Finnegan, Timothy J, MD        VITAL SIGNS: There were no vitals taken for this visit. There were no vitals filed for this visit.  Estimated body mass index is 33.59 kg/m as calculated from the following:   Height as of an earlier encounter on 02/24/24: 5' (1.524 m).   Weight as of an earlier encounter on 02/24/24: 172 lb (78 kg).  LABS: CBC:    Component Value Date/Time   WBC 20.6 (H) 02/24/2024 0854   WBC 30.8 (H) 02/11/2024 0828   HGB 11.3 (L) 02/24/2024 0854   HCT 35.5 (L) 02/24/2024 0854   PLT 614 (H) 02/24/2024 0854   MCV 74.3 (L) 02/24/2024 0854   NEUTROABS 16.1 (H) 02/24/2024 0854   LYMPHSABS 1.3 02/24/2024 0854   MONOABS 2.4 (H) 02/24/2024 0854   EOSABS 0.0 02/24/2024 0854   BASOSABS 0.2 (H) 02/24/2024 0854   Comprehensive Metabolic Panel:    Component Value Date/Time   NA 128 (L) 02/24/2024 0854   K 4.6 02/24/2024 0854   CL 93 (L) 02/24/2024 0854   CO2 24 02/24/2024 0854   BUN 22 02/24/2024 0854   CREATININE 0.85 02/24/2024 0854   GLUCOSE 129 (H) 02/24/2024 0854   CALCIUM 8.7 (L) 02/24/2024 0854   AST 21 02/24/2024 0854   ALT 16 02/24/2024 0854   ALKPHOS 140  (H) 02/24/2024 0854   BILITOT 0.4 02/24/2024 0854   PROT 7.0 02/24/2024 0854   ALBUMIN  2.7 (L) 02/24/2024 0854    RADIOGRAPHIC STUDIES: No results found.  PERFORMANCE STATUS (ECOG) : 1 - Symptomatic but completely ambulatory  Review of Systems Unless otherwise noted, a complete review of systems is negative.  Physical Exam General: NAD Cardiovascular: regular rate and rhythm Pulmonary: clear ant fields Abdomen: soft, nontender, + bowel sounds GU: no suprapubic tenderness Extremities: no edema, no joint deformities Skin: no rashes Neurological: Weakness but otherwise nonfocal  IMPRESSION: Follow-up visit.  Patient seen in infusion.  Patient reports resolution of previous scapular pain.  However, most recently, she has been having pain in the bilateral legs with report of numbness and tingling.  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  Discussed starting her on low-dose duloxetine.  Patient taking oxycodone  but says that effect is limited.   PLAN: -Continue current scope of treatment - Start oxycodone  5 to 10 mg every 4 hours as needed - Start duloxetine 20 mg daily - Daily bowel regimen - Patient to bring in ACP documents to be scanned - MOST Form previously reviewed - Follow-up 1 month  Case and plan discussed Dr. Jacobo   Patient expressed understanding and was in agreement with this plan. She also understands that She can call the clinic at any time with any questions, concerns, or complaints.     Time Total: 20 minutes  Visit consisted of counseling and education dealing with the complex and emotionally intense issues of symptom management and palliative care in the setting of serious and potentially life-threatening illness.Greater than 50%  of this time was spent counseling and coordinating care related to the above assessment and plan.  Signed by: Fonda Mower, PhD, NP-C

## 2024-02-24 NOTE — Progress Notes (Signed)
 Radiation Oncology Follow up Note  Name: Hannah Yu   Date:   02/24/2024 MRN:  [id number] DOB: 03-17-1946    This 78 y.o. female presents to the clinic today for reevaluation of left shoulder and chest pain and patient with known stage IV adenocarcinoma of the lung   REFERRING PROVIDER: Epifanio Alm SQUIBB, MD  HPI: Patient is a 78 year old female well-known to her apartment having received multiple courses of radiation therapy for now known stage IV adenocarcinoma lung.  She has received brain SRS as well as SBRT to her left shoulder back in June 2025.  She is still having pain in the left shoulder although this may residual from the bony destruction caused by metastatic involvement at the time.  She has been having some atypical chest pain and shoulder pain and she did have a lesion on her last PET scan around the T6 vertebral body with close approximation to the spinal canal.  She was seen today in infusion for evaluation..  COMPLICATIONS OF TREATMENT: none  FOLLOW UP COMPLIANCE: keeps appointments   PHYSICAL EXAM:  There were no vitals taken for this visit. Range of motion of her left shoulder does elicit some pain and discomfort.  Motor and sensory levels in the upper lower extremities are equal and symmetric.  Well-developed well-nourished patient in NAD. HEENT reveals PERLA, EOMI, discs not visualized.  Oral cavity is clear. No oral mucosal lesions are identified. Neck is clear without evidence of cervical or supraclavicular adenopathy. Lungs are clear to A&P. Cardiac examination is essentially unremarkable with regular rate and rhythm without murmur rub or thrill. Abdomen is benign with no organomegaly or masses noted. Motor sensory and DTR levels are equal and symmetric in the upper and lower extremities. Cranial nerves II through XII are grossly intact. Proprioception is intact. No peripheral adenopathy or edema is identified. No motor or sensory levels are noted. Crude visual  fields are within normal range.  RADIOLOGY RESULTS: PET scan reviewed compatible with above-stated findings  PLAN: At this time I am offering palliative radiation therapy to her thoracic spinal vertebral body with tumor in close proximity to the spinal canal.  Will plan on delivering 30 Gray in 5 fractions using SBRT.  Risks and benefits of treatment including extremely low side effect profile possibly some fatigue all were reviewed with the patient.  She comprehends my treatment plan well.  I have personally set up and ordered CT simulation for next week.  I would like to take this opportunity to thank you for allowing me to participate in the care of your patient.SABRA Marcey Penton, MD

## 2024-02-24 NOTE — Patient Instructions (Signed)

## 2024-02-25 ENCOUNTER — Encounter: Payer: Self-pay | Admitting: Oncology

## 2024-02-25 NOTE — Telephone Encounter (Signed)
 I spoke with patient and answered her questions regarding toothpaste for sensitive teeth.  She feels neuropathy has improved since starting duloxetine.

## 2024-02-26 ENCOUNTER — Inpatient Hospital Stay

## 2024-02-26 ENCOUNTER — Telehealth: Payer: Self-pay

## 2024-02-26 ENCOUNTER — Other Ambulatory Visit: Payer: Self-pay

## 2024-02-26 ENCOUNTER — Encounter: Payer: Self-pay | Admitting: Oncology

## 2024-02-26 DIAGNOSIS — Z5112 Encounter for antineoplastic immunotherapy: Secondary | ICD-10-CM | POA: Diagnosis not present

## 2024-02-26 DIAGNOSIS — C3492 Malignant neoplasm of unspecified part of left bronchus or lung: Secondary | ICD-10-CM

## 2024-02-26 MED ORDER — PEGFILGRASTIM-FPGK 6 MG/0.6ML ~~LOC~~ SOSY
6.0000 mg | PREFILLED_SYRINGE | Freq: Once | SUBCUTANEOUS | Status: AC
  Administered 2024-02-26: 6 mg via SUBCUTANEOUS
  Filled 2024-02-26: qty 0.6

## 2024-02-26 MED ORDER — STERILE WATER FOR INJECTION IJ SOLN
5.0000 mL | Freq: Four times a day (QID) | OROMUCOSAL | 3 refills | Status: DC | PRN
  Filled 2024-02-26: qty 480, 12d supply, fill #0

## 2024-02-26 NOTE — Telephone Encounter (Signed)
 Faxed over the magic mouthwash prescription for patients mouth sores.

## 2024-02-29 ENCOUNTER — Encounter: Payer: Self-pay | Admitting: Oncology

## 2024-02-29 ENCOUNTER — Telehealth: Payer: Self-pay | Admitting: *Deleted

## 2024-02-29 NOTE — Telephone Encounter (Signed)
 The patient states that she is tired all the time she is on chemotherapy CarboTaxol bevacizumab.  He does want something to take so she could perk up a little bit.  I looked in the labs and I did not see if she has ever had B12 level that I can see. Let me know if there is anything that she can help with the patient.

## 2024-02-29 NOTE — Telephone Encounter (Signed)
 I spoke to the patient and she says that hopefully he will find something to make me be better with the fatigue.

## 2024-03-02 ENCOUNTER — Ambulatory Visit
Admission: RE | Admit: 2024-03-02 | Discharge: 2024-03-02 | Disposition: A | Discharge status: Home / Self Care | Source: Ambulatory Visit | Attending: Radiation Oncology | Admitting: Radiation Oncology

## 2024-03-02 ENCOUNTER — Inpatient Hospital Stay

## 2024-03-02 ENCOUNTER — Other Ambulatory Visit: Payer: Self-pay | Admitting: *Deleted

## 2024-03-02 DIAGNOSIS — Z5112 Encounter for antineoplastic immunotherapy: Secondary | ICD-10-CM | POA: Diagnosis not present

## 2024-03-02 DIAGNOSIS — C7951 Secondary malignant neoplasm of bone: Secondary | ICD-10-CM | POA: Insufficient documentation

## 2024-03-02 DIAGNOSIS — Z87891 Personal history of nicotine dependence: Secondary | ICD-10-CM | POA: Insufficient documentation

## 2024-03-02 DIAGNOSIS — C3490 Malignant neoplasm of unspecified part of unspecified bronchus or lung: Secondary | ICD-10-CM | POA: Insufficient documentation

## 2024-03-02 DIAGNOSIS — C3492 Malignant neoplasm of unspecified part of left bronchus or lung: Secondary | ICD-10-CM

## 2024-03-02 MED ORDER — SODIUM CHLORIDE 0.9 % IV SOLN
Freq: Once | INTRAVENOUS | Status: AC
  Filled 2024-03-02: qty 250

## 2024-03-02 MED ORDER — SUCRALFATE 1 G PO TABS: 1.0000 g | ORAL_TABLET | Freq: Three times a day (TID) | ORAL | 0 refills | Status: DC

## 2024-03-04 ENCOUNTER — Encounter: Payer: Self-pay | Admitting: Oncology

## 2024-03-07 ENCOUNTER — Telehealth: Payer: Self-pay | Admitting: *Deleted

## 2024-03-07 ENCOUNTER — Other Ambulatory Visit: Payer: Self-pay | Admitting: *Deleted

## 2024-03-07 ENCOUNTER — Inpatient Hospital Stay

## 2024-03-07 ENCOUNTER — Inpatient Hospital Stay: Attending: Oncology | Admitting: Hospice and Palliative Medicine

## 2024-03-07 VITALS — BP 133/70 | HR 71 | Temp 98.6°F | Resp 16 | Ht 60.0 in | Wt 163.4 lb

## 2024-03-07 DIAGNOSIS — G620 Drug-induced polyneuropathy: Secondary | ICD-10-CM

## 2024-03-07 DIAGNOSIS — K1230 Oral mucositis (ulcerative), unspecified: Secondary | ICD-10-CM | POA: Diagnosis not present

## 2024-03-07 DIAGNOSIS — C3492 Malignant neoplasm of unspecified part of left bronchus or lung: Secondary | ICD-10-CM | POA: Diagnosis not present

## 2024-03-07 DIAGNOSIS — E860 Dehydration: Secondary | ICD-10-CM

## 2024-03-07 DIAGNOSIS — T451X5A Adverse effect of antineoplastic and immunosuppressive drugs, initial encounter: Secondary | ICD-10-CM

## 2024-03-07 DIAGNOSIS — C7931 Secondary malignant neoplasm of brain: Secondary | ICD-10-CM | POA: Insufficient documentation

## 2024-03-07 DIAGNOSIS — K1379 Other lesions of oral mucosa: Secondary | ICD-10-CM

## 2024-03-07 DIAGNOSIS — Z87891 Personal history of nicotine dependence: Secondary | ICD-10-CM | POA: Insufficient documentation

## 2024-03-07 DIAGNOSIS — C3432 Malignant neoplasm of lower lobe, left bronchus or lung: Secondary | ICD-10-CM | POA: Insufficient documentation

## 2024-03-07 DIAGNOSIS — E871 Hypo-osmolality and hyponatremia: Secondary | ICD-10-CM

## 2024-03-07 LAB — CBC (CANCER CENTER ONLY)
HCT: 34.2 % — ABNORMAL LOW (ref 36.0–46.0)
Hemoglobin: 11.1 g/dL — ABNORMAL LOW (ref 12.0–15.0)
MCH: 24.2 pg — ABNORMAL LOW (ref 26.0–34.0)
MCHC: 32.5 g/dL (ref 30.0–36.0)
MCV: 74.5 fL — ABNORMAL LOW (ref 80.0–100.0)
Platelet Count: 137 K/uL — ABNORMAL LOW (ref 150–400)
RBC: 4.59 MIL/uL (ref 3.87–5.11)
RDW: 21.2 % — ABNORMAL HIGH (ref 11.5–15.5)
WBC Count: 24.8 K/uL — ABNORMAL HIGH (ref 4.0–10.5)
nRBC: 0.1 % (ref 0.0–0.2)

## 2024-03-07 LAB — BASIC METABOLIC PANEL - CANCER CENTER ONLY
Anion gap: 11 (ref 5–15)
BUN: 20 mg/dL (ref 8–23)
CO2: 23 mmol/L (ref 22–32)
Calcium: 8.4 mg/dL — ABNORMAL LOW (ref 8.9–10.3)
Chloride: 88 mmol/L — ABNORMAL LOW (ref 98–111)
Creatinine: 0.76 mg/dL (ref 0.44–1.00)
GFR, Estimated: >60 mL/min (ref >60)
Glucose, Bld: 128 mg/dL — ABNORMAL HIGH (ref 70–99)
Potassium: 4.1 mmol/L (ref 3.5–5.1)
Sodium: 122 mmol/L — ABNORMAL LOW (ref 135–145)

## 2024-03-07 MED ORDER — DULOXETINE HCL 30 MG PO CPEP: 30.0000 mg | ORAL_CAPSULE | Freq: Every day | ORAL | 3 refills | Status: DC

## 2024-03-07 MED ORDER — SODIUM CHLORIDE 0.9 % IV SOLN
INTRAVENOUS | Status: DC
  Filled 2024-03-07 (×2): qty 250

## 2024-03-07 MED ORDER — MAGIC MOUTHWASH W/LIDOCAINE: 5.0000 mL | Freq: Four times a day (QID) | ORAL | 0 refills | Status: DC

## 2024-03-07 NOTE — Progress Notes (Unsigned)
 Symptom Management Clinic [facility] at [facility] Telephone:(336) [phone] Fax:(336) [phone]  Patient Care Team: Epifanio Alm SQUIBB, MD as PCP - General (Infectious Diseases) Verdene Gills, RN as Oncology Nurse Navigator Lenn Aran, MD as Consulting Physician (Radiation Oncology) Jacobo Evalene PARAS, MD as Consulting Physician (Oncology)   NAME OF PATIENT: Hannah Yu  [id number]  07-14-1945   DATE OF VISIT: 03/07/24  REASON FOR CONSULT: Hannah Yu is a 78 y.o. female with multiple medical problems including stage IVb adenocarcinoma of the lung.   INTERVAL HISTORY: Patient had cycle 2 CarboTaxol plus Bev on 02/24/2024.  Patient presents to [facility] for evaluation of mouth pain, difficulty swallowing.  No improvement with Magic mouthwash.  Patient states general mouth pain and odynophagia.  Denies noticing any specific lesions, ulcerations, or white patches.  States oral intake has been poor.  Also endorses ongoing peripheral neuropathy.  No significant improvement after starting duloxetine.  Denies any neurologic complaints. Denies recent fevers or illnesses. Denies any easy bleeding or bruising. Denies chest pain. Denies any nausea, vomiting, constipation, or diarrhea. Denies urinary complaints. Patient offers no further specific complaints today.   PAST MEDICAL HISTORY: Past Medical History:  Diagnosis Date   Acid reflux    Anemia    Anesthesia complication    Tachycardia previously, now on metoprolol    Arthritis    09/02/2019: per patient have it real bad in both hands and back   Difficult intubation    had to terminate intubation unable to advance tube for cholecystectomy 2017?   DVT (deep venous thrombosis) (HCC)    leg   Hypertension    Sciatica    09/02/2019: has had it for about 3 years    PAST SURGICAL HISTORY:  Past Surgical History:  Procedure Laterality Date   ABDOMINAL HYSTERECTOMY     CATARACT EXTRACTION W/  INTRAOCULAR LENS IMPLANT Bilateral 2007   eyes done within 1 month of each other.   IR IMAGING GUIDED PORT INSERTION  05/19/2023   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   TOTAL HIP ARTHROPLASTY Right 09/06/2019   Procedure: RIGHT TOTAL HIP ARTHROPLASTY ANTERIOR APPROACH;  Surgeon: Vernetta Lonni GRADE, MD;  Location: [facility] OR;  Service: Orthopedics;  Laterality: Right;   TOTAL HIP ARTHROPLASTY Left 07/31/2020   Procedure: LEFT TOTAL HIP ARTHROPLASTY ANTERIOR APPROACH;  Surgeon: Vernetta Lonni GRADE, MD;  Location: [facility] OR;  Service: Orthopedics;  Laterality: Left;   VIDEO BRONCHOSCOPY WITH ENDOBRONCHIAL ULTRASOUND N/A 10/06/2022   Procedure: VIDEO BRONCHOSCOPY WITH ENDOBRONCHIAL ULTRASOUND;  Surgeon: Parris Manna, MD;  Location: [facility] ORS;  Service: Thoracic;  Laterality: N/A;    HEMATOLOGY/ONCOLOGY HISTORY:  Oncology History  Adenocarcinoma of left lung (HCC)  03/19/2023 Initial Diagnosis   Adenocarcinoma of left lung (HCC)   03/19/2023 Cancer Staging   Staging form: Lung, AJCC 8th Edition - Clinical stage from 03/19/2023: Stage IVB (cTX, cN2, cM1c) - Signed by Jacobo Evalene PARAS, MD on 03/19/2023 Stage prefix: Initial diagnosis   05/07/2023 - 07/16/2023 Chemotherapy   Patient is on Treatment Plan : LUNG NSCLC Pembrolizumab  (200) q21d     02/03/2024 -  Chemotherapy   Patient is on Treatment Plan : LUNG NSCLC Carboplatin + Paclitaxel + Bevacizumab q21d       ALLERGIES:  is allergic to ezetimibe, codeine, cortisone, and lovastatin.  MEDICATIONS:  Current Outpatient Medications  Medication Sig Dispense Refill   aspirin  EC 81 MG tablet Take 162 mg by  mouth daily. Swallow whole.     Biotin 10 MG CAPS      cholecalciferol  (VITAMIN D3) 25 MCG (1000 UNIT) tablet Take 1,000 Units by mouth daily.     diphenoxylate -atropine  (LOMOTIL ) 2.5-0.025 MG tablet Take 1 tablet by mouth 4 (four) times daily as needed for diarrhea or loose  stools. 60 tablet 2   DULoxetine (CYMBALTA) 20 MG capsule Take 1 capsule (20 mg total) by mouth daily. 30 capsule 3   FIBER PO Take 1 tablet by mouth daily.     furosemide  (LASIX ) 40 MG tablet Take 40 mg by mouth daily. 40 mg alternating 80 mg every other day     ibuprofen (ADVIL) 200 MG tablet Take 400 mg by mouth every 6 (six) hours as needed for mild pain or moderate pain.     Ibuprofen-diphenhydrAMINE  HCl (IBUPROFEN PM) 200-25 MG CAPS Take 2 tablets by mouth at bedtime as needed (sleep).     LORazepam (ATIVAN) 0.5 MG tablet Take by mouth.     losartan  (COZAAR ) 100 MG tablet Take 100 mg by mouth daily.     magic mouthwash (multi-ingredient) oral suspension Swish and swallow 5-10 mLs 4 (four) times daily as needed. 480 mL 3   magnesium oxide (MAG-OX) 400 (240 Mg) MG tablet Take 400 mg by mouth daily.     methylPREDNISolone  (MEDROL  DOSEPAK) 4 MG TBPK tablet Start with 6 tablets for 1st day and then reduce by one tablet daily until completed. 21 each 0   metoprolol  succinate (TOPROL -XL) 50 MG 24 hr tablet Take 50 mg by mouth daily.     omeprazole (PRILOSEC) 40 MG capsule Take 40 mg by mouth daily as needed.     ondansetron  (ZOFRAN ) 8 MG tablet Take 1 tablet (8 mg total) by mouth every 8 (eight) hours as needed for nausea or vomiting. 30 tablet 3   OVER THE COUNTER MEDICATION Take 1 capsule by mouth daily. CBD gummie     oxyCODONE  (OXY IR/ROXICODONE ) 5 MG immediate release tablet Take 1-2 tablets (5-10 mg total) by mouth every 4 (four) hours as needed for severe pain (pain score 7-10). 60 tablet 0   prochlorperazine  (COMPAZINE ) 10 MG tablet TAKE ONE TABLET BY MOUTH EVERY 6 HOURS AS NEEDED FOR NAUSEA / VOMITING 30 tablet 2   sucralfate (CARAFATE) 1 g tablet Take 1 tablet (1 g total) by mouth 3 (three) times daily before meals. Dissolve tablet in 4 T warm water swish and swallow 30 minutes before meals. 90 tablet 0   Tetrahydrozoline HCl (VISINE OP) Place 1 drop into both eyes daily as needed (dry  eyes).     vitamin B-12 (CYANOCOBALAMIN ) 1000 MCG tablet Take 1,000 mcg by mouth daily.     No current facility-administered medications for this visit.    VITAL SIGNS: There were no vitals taken for this visit. There were no vitals filed for this visit.  Estimated body mass index is 33.59 kg/m as calculated from the following:   Height as of 02/24/24: 5' (1.524 m).   Weight as of 02/24/24: 172 lb (78 kg).  LABS: CBC:    Component Value Date/Time   WBC 20.6 (H) 02/24/2024 0854   WBC 30.8 (H) 02/11/2024 0828   HGB 11.3 (L) 02/24/2024 0854   HCT 35.5 (L) 02/24/2024 0854   PLT 614 (H) 02/24/2024 0854   MCV 74.3 (L) 02/24/2024 0854   NEUTROABS 16.1 (H) 02/24/2024 0854   LYMPHSABS 1.3 02/24/2024 0854   MONOABS 2.4 (H) 02/24/2024 9145  EOSABS 0.0 02/24/2024 0854   BASOSABS 0.2 (H) 02/24/2024 0854   Comprehensive Metabolic Panel:    Component Value Date/Time   NA 128 (L) 02/24/2024 0854   K 4.6 02/24/2024 0854   CL 93 (L) 02/24/2024 0854   CO2 24 02/24/2024 0854   BUN 22 02/24/2024 0854   CREATININE 0.85 02/24/2024 0854   GLUCOSE 129 (H) 02/24/2024 0854   CALCIUM 8.7 (L) 02/24/2024 0854   AST 21 02/24/2024 0854   ALT 16 02/24/2024 0854   ALKPHOS 140 (H) 02/24/2024 0854   BILITOT 0.4 02/24/2024 0854   PROT 7.0 02/24/2024 0854   ALBUMIN  2.7 (L) 02/24/2024 0854    RADIOGRAPHIC STUDIES: No results found.  PERFORMANCE STATUS (ECOG) : 2 - Symptomatic, <50% confined to bed  Review of Systems Unless otherwise noted, a complete review of systems is negative.  Physical Exam General: NAD HEENT: No ulcerations or thrush Cardiovascular: regular rate and rhythm Pulmonary: clear ant fields Abdomen: soft, nontender, + bowel sounds GU: no suprapubic tenderness Extremities: no edema, no joint deformities Skin: no rashes Neurological: Weakness but otherwise nonfocal  IMPRESSION/PLAN: Stage IV lung cancer -on chemotherapy with carbo Taxol plus Bev  Mucositis -No  ulcerations or thrush noted on exam. will rotate to Magic mouthwash with lidocaine .  If needed, could consider dexamethasone  oral rinse or doxepin.  Also recommended baking soda/salt rinses and good oral hygiene.  Hyponatremia -suspect hypovolemia with reduced oral intake.  Will proceed with IV fluids today. SIADH is also a possibility.  Will order hyponatremia workup.  Chemo-induced peripheral neuropathy -increase duloxetine 30 mg daily.  Referral for rehab screening.  MD follow-up next week.  Patient expressed understanding and was in agreement with this plan. She also understands that She can call clinic at any time with any questions, concerns, or complaints.   Thank you for allowing me to participate in the care of this very pleasant patient.   Time Total: 25 minutes  Visit consisted of counseling and education dealing with the complex and emotionally intense issues of symptom management in the setting of serious illness.Greater than 50%  of this time was spent counseling and coordinating care related to the above assessment and plan.  Signed by: Fonda Mower, PhD, NP-C

## 2024-03-07 NOTE — Patient Instructions (Signed)
 Baking Soda Mouth Rinse.  Mix with water: Add a small amount of baking soda to a glass of water and rinse your mouth for 30 seconds.  Use as a toothpaste: Add a teaspoon of baking soda to your regular toothpaste.  Make a mouthwash: Dissolve 1/2 teaspoon of baking soda in 8 ounces of water.

## 2024-03-07 NOTE — Telephone Encounter (Signed)
 Call placed to follow up on message patient left this morning. Patient reports mouth pain, difficulty swallowing.  I called back to get more information from patient. Patient reports pain, swelling and sores to her mouth and throat. She is having difficulty swallowing and talking. Patient does have magic mouthwash on hand but has not relieved symptoms. Patient is currently on active treatment. Please advise on if she can be seen in [facility]. She requests visit be virtual if possible.

## 2024-03-08 ENCOUNTER — Encounter: Payer: Self-pay | Admitting: Oncology

## 2024-03-10 ENCOUNTER — Encounter: Payer: Self-pay | Admitting: Hospice and Palliative Medicine

## 2024-03-10 ENCOUNTER — Telehealth: Payer: Self-pay | Admitting: *Deleted

## 2024-03-10 DIAGNOSIS — E860 Dehydration: Secondary | ICD-10-CM

## 2024-03-10 DIAGNOSIS — C3492 Malignant neoplasm of unspecified part of left bronchus or lung: Secondary | ICD-10-CM

## 2024-03-10 NOTE — Telephone Encounter (Signed)
 Left vm for patient. Pt had sent mychart to request for IV fluids. I spoke with Josh, ok to add pt for port labs and iv fluids today. Left vm for patient to return my phone call.

## 2024-03-11 ENCOUNTER — Encounter: Payer: Self-pay | Admitting: Oncology

## 2024-03-11 ENCOUNTER — Other Ambulatory Visit: Payer: Self-pay | Admitting: Oncology

## 2024-03-11 DIAGNOSIS — Z510 Encounter for antineoplastic radiation therapy: Secondary | ICD-10-CM | POA: Diagnosis not present

## 2024-03-11 NOTE — Telephone Encounter (Signed)
 Patient never returned phone call nor mychart.

## 2024-03-14 ENCOUNTER — Other Ambulatory Visit: Payer: Self-pay

## 2024-03-14 ENCOUNTER — Inpatient Hospital Stay

## 2024-03-14 ENCOUNTER — Ambulatory Visit
Admission: RE | Admit: 2024-03-14 | Discharge: 2024-03-14 | Disposition: A | Discharge status: Home / Self Care | Source: Ambulatory Visit | Attending: Radiation Oncology | Admitting: Radiation Oncology

## 2024-03-14 DIAGNOSIS — Z510 Encounter for antineoplastic radiation therapy: Secondary | ICD-10-CM | POA: Diagnosis not present

## 2024-03-14 DIAGNOSIS — C3492 Malignant neoplasm of unspecified part of left bronchus or lung: Secondary | ICD-10-CM | POA: Insufficient documentation

## 2024-03-14 DIAGNOSIS — E860 Dehydration: Secondary | ICD-10-CM

## 2024-03-14 DIAGNOSIS — C7951 Secondary malignant neoplasm of bone: Secondary | ICD-10-CM | POA: Insufficient documentation

## 2024-03-14 DIAGNOSIS — Z87891 Personal history of nicotine dependence: Secondary | ICD-10-CM | POA: Insufficient documentation

## 2024-03-14 LAB — RAD ONC ARIA SESSION SUMMARY
Course Elapsed Days: 0
Plan Fractions Treated to Date: 1
Plan Prescribed Dose Per Fraction: 6 Gy
Plan Total Fractions Prescribed: 5
Plan Total Prescribed Dose: 30 Gy
Reference Point Dosage Given to Date: 6 Gy
Reference Point Session Dosage Given: 6 Gy
Session Number: 1

## 2024-03-14 MED ORDER — SODIUM CHLORIDE 0.9 % IV SOLN
Freq: Once | INTRAVENOUS | Status: AC
  Filled 2024-03-14: qty 250

## 2024-03-16 ENCOUNTER — Ambulatory Visit

## 2024-03-16 ENCOUNTER — Inpatient Hospital Stay

## 2024-03-16 ENCOUNTER — Inpatient Hospital Stay (HOSPITAL_BASED_OUTPATIENT_CLINIC_OR_DEPARTMENT_OTHER): Admitting: Oncology

## 2024-03-16 ENCOUNTER — Encounter: Payer: Self-pay | Admitting: Oncology

## 2024-03-16 ENCOUNTER — Inpatient Hospital Stay: Admitting: Occupational Therapy

## 2024-03-16 ENCOUNTER — Inpatient Hospital Stay (HOSPITAL_BASED_OUTPATIENT_CLINIC_OR_DEPARTMENT_OTHER): Admitting: Hospice and Palliative Medicine

## 2024-03-16 DIAGNOSIS — R531 Weakness: Secondary | ICD-10-CM | POA: Diagnosis not present

## 2024-03-16 DIAGNOSIS — C3492 Malignant neoplasm of unspecified part of left bronchus or lung: Secondary | ICD-10-CM | POA: Diagnosis not present

## 2024-03-16 DIAGNOSIS — Z515 Encounter for palliative care: Secondary | ICD-10-CM | POA: Diagnosis not present

## 2024-03-16 DIAGNOSIS — Z510 Encounter for antineoplastic radiation therapy: Secondary | ICD-10-CM | POA: Diagnosis not present

## 2024-03-16 LAB — CBC WITH DIFFERENTIAL (CANCER CENTER ONLY)
Abs Immature Granulocytes: 0.76 K/uL — ABNORMAL HIGH (ref 0.00–0.07)
Basophils Absolute: 0.1 K/uL (ref 0.0–0.1)
Basophils Relative: 1 %
Eosinophils Absolute: 0 K/uL (ref 0.0–0.5)
Eosinophils Relative: 0 %
HCT: 32.7 % — ABNORMAL LOW (ref 36.0–46.0)
Hemoglobin: 10.8 g/dL — ABNORMAL LOW (ref 12.0–15.0)
Immature Granulocytes: 4 %
Lymphocytes Relative: 5 %
Lymphs Abs: 0.9 K/uL (ref 0.7–4.0)
MCH: 24.5 pg — ABNORMAL LOW (ref 26.0–34.0)
MCHC: 33 g/dL (ref 30.0–36.0)
MCV: 74.1 fL — ABNORMAL LOW (ref 80.0–100.0)
Monocytes Absolute: 1.5 K/uL — ABNORMAL HIGH (ref 0.1–1.0)
Monocytes Relative: 9 %
Neutro Abs: 14.5 K/uL — ABNORMAL HIGH (ref 1.7–7.7)
Neutrophils Relative %: 81 %
Platelet Count: 411 K/uL — ABNORMAL HIGH (ref 150–400)
RBC: 4.41 MIL/uL (ref 3.87–5.11)
RDW: 21.7 % — ABNORMAL HIGH (ref 11.5–15.5)
WBC Count: 17.7 K/uL — ABNORMAL HIGH (ref 4.0–10.5)
nRBC: 0 % (ref 0.0–0.2)

## 2024-03-16 LAB — CMP (CANCER CENTER ONLY)
ALT: 19 U/L (ref 0–44)
AST: 24 U/L (ref 15–41)
Albumin: 2.5 g/dL — ABNORMAL LOW (ref 3.5–5.0)
Alkaline Phosphatase: 173 U/L — ABNORMAL HIGH (ref 38–126)
Anion gap: 13 (ref 5–15)
BUN: 20 mg/dL (ref 8–23)
CO2: 24 mmol/L (ref 22–32)
Calcium: 8.3 mg/dL — ABNORMAL LOW (ref 8.9–10.3)
Chloride: 81 mmol/L — ABNORMAL LOW (ref 98–111)
Creatinine: 0.69 mg/dL (ref 0.44–1.00)
GFR, Estimated: >60 mL/min (ref >60)
Glucose, Bld: 190 mg/dL — ABNORMAL HIGH (ref 70–99)
Potassium: 3.9 mmol/L (ref 3.5–5.1)
Sodium: 118 mmol/L — CL (ref 135–145)
Total Bilirubin: 0.4 mg/dL (ref 0.0–1.2)
Total Protein: 6.8 g/dL (ref 6.5–8.1)

## 2024-03-16 MED ORDER — PREGABALIN 25 MG PO CAPS: 25.0000 mg | ORAL_CAPSULE | Freq: Two times a day (BID) | ORAL | 0 refills | Status: DC

## 2024-03-16 MED ORDER — SUCRALFATE 1 G PO TABS: 1.0000 g | ORAL_TABLET | Freq: Three times a day (TID) | ORAL | 0 refills | Status: DC

## 2024-03-16 MED ORDER — SODIUM CHLORIDE 1 G PO TABS: 1.0000 g | ORAL_TABLET | Freq: Three times a day (TID) | ORAL | 0 refills | Status: DC

## 2024-03-16 NOTE — Progress Notes (Signed)
 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  INTERVAL HISTORY: Patient returns to clinic today for repeat laboratory, further evaluation, and consideration of cycle 3 of carboplatin, Taxol, and Avastin.  She continues to have a peripheral neuropathy making it difficult to walk.  She also has chronic weakness and fatigue.  She has no other neurologic complaints. She denies any recent fevers or illnesses. She has a good appetite and denies weight loss.  She denies any chest pain, shortness of breath, cough, or hemoptysis.  She denies any nausea, vomiting, constipation, or diarrhea.  She has no urinary complaints.  Patient offers no further specific complaints today.  REVIEW OF SYSTEMS:   Review of Systems  Constitutional:  Positive for malaise/fatigue. Negative for fever and weight loss.  Respiratory: Negative.  Negative for cough, hemoptysis and shortness of breath.   Cardiovascular: Negative.  Negative for chest pain and leg swelling.  Gastrointestinal: Negative.  Negative for abdominal pain.  Genitourinary: Negative.  Negative for dysuria.  Musculoskeletal: Negative.  Negative for back pain and joint pain.  Skin: Negative.  Negative for rash.  Neurological:  Positive for tingling, sensory change and weakness. Negative for dizziness, focal weakness and headaches.  Psychiatric/Behavioral: Negative.  The patient is not nervous/anxious.     As per HPI. Otherwise, a complete review of systems is negative.  PAST MEDICAL HISTORY: Past  Medical History:  Diagnosis Date   Acid reflux    Anemia    Anesthesia complication    Tachycardia previously, now on metoprolol    Arthritis    09/02/2019: per patient have it real bad in both hands and back   Difficult intubation    had to terminate intubation unable to advance tube for cholecystectomy 2017?   DVT (deep venous thrombosis) (HCC)    leg   Hypertension    Sciatica    09/02/2019: has had it for about 3 years    PAST SURGICAL HISTORY: Past Surgical History:  Procedure Laterality Date   ABDOMINAL HYSTERECTOMY     CATARACT EXTRACTION W/ INTRAOCULAR LENS IMPLANT Bilateral 2007   eyes done within 1 month of each other.   IR IMAGING GUIDED PORT INSERTION  05/19/2023   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   TOTAL HIP ARTHROPLASTY Right 09/06/2019   Procedure: RIGHT TOTAL HIP ARTHROPLASTY ANTERIOR APPROACH;  Surgeon: Vernetta Lonni GRADE, MD;  Location: [facility] OR;  Service: Orthopedics;  Laterality: Right;   TOTAL HIP ARTHROPLASTY Left 07/31/2020   Procedure: LEFT TOTAL HIP ARTHROPLASTY ANTERIOR APPROACH;  Surgeon: Vernetta Lonni GRADE, MD;  Location: [facility] OR;  Service: Orthopedics;  Laterality: Left;   VIDEO BRONCHOSCOPY WITH ENDOBRONCHIAL ULTRASOUND N/A 10/06/2022   Procedure: VIDEO BRONCHOSCOPY WITH ENDOBRONCHIAL ULTRASOUND;  Surgeon: Parris Manna, MD;  Location: [facility] ORS;  Service: Thoracic;  Laterality: N/A;    FAMILY HISTORY: Family History  Problem Relation Age of Onset   Breast cancer Sister 14    ADVANCED DIRECTIVES (Y/N):  N  HEALTH MAINTENANCE: Social History   Tobacco Use   Smoking status: Former    Current  packs/day: 0.00    Types: Cigarettes    Quit date: 01/02/1993    Years since quitting: 31.2   Smokeless tobacco: Never  Vaping Use   Vaping status: Never Used  Substance Use Topics   Alcohol use: Yes    Alcohol/week: 4.0 standard drinks of alcohol    Types: 4 Glasses of wine  per week    Comment: 09/02/2019: per patient ever so often   Drug use: No     Colonoscopy:  PAP:  Bone density:  Lipid panel:  Allergies  Allergen Reactions   Ezetimibe Other (See Comments)    Body aches, and stiffness   Codeine Diarrhea   Cortisone Swelling    injections   Lovastatin Other (See Comments)    Muscle cramps    Current Outpatient Medications  Medication Sig Dispense Refill   sodium chloride  1 g tablet Take 1 tablet (1 g total) by mouth 3 (three) times daily with meals. 60 tablet 0   aspirin  EC 81 MG tablet Take 162 mg by mouth daily. Swallow whole.     Biotin 10 MG CAPS      cholecalciferol  (VITAMIN D3) 25 MCG (1000 UNIT) tablet Take 1,000 Units by mouth daily.     diphenoxylate -atropine  (LOMOTIL ) 2.5-0.025 MG tablet Take 1 tablet by mouth 4 (four) times daily as needed for diarrhea or loose stools. 60 tablet 2   DULoxetine (CYMBALTA) 30 MG capsule Take 1 capsule (30 mg total) by mouth daily. 30 capsule 3   FIBER PO Take 1 tablet by mouth daily.     furosemide  (LASIX ) 40 MG tablet Take 40 mg by mouth daily. 40 mg alternating 80 mg every other day     ibuprofen (ADVIL) 200 MG tablet Take 400 mg by mouth every 6 (six) hours as needed for mild pain or moderate pain.     Ibuprofen-diphenhydrAMINE  HCl (IBUPROFEN PM) 200-25 MG CAPS Take 2 tablets by mouth at bedtime as needed (sleep).     LORazepam (ATIVAN) 0.5 MG tablet Take by mouth.     losartan  (COZAAR ) 100 MG tablet Take 100 mg by mouth daily.     magic mouthwash (multi-ingredient) oral suspension Swish and swallow 5-10 mLs 4 (four) times daily as needed. 480 mL 3   magic mouthwash w/lidocaine  SOLN Take 5 mLs by mouth 4 (four) times daily. Suspension contains equal amounts of Maalox Extra Strength, nystatin, diphenhydramine  and lidocaine . 480 mL 0   magnesium oxide (MAG-OX) 400 (240 Mg) MG tablet Take 400 mg by mouth daily.     methylPREDNISolone  (MEDROL  DOSEPAK) 4 MG TBPK tablet Start with 6 tablets for 1st day  and then reduce by one tablet daily until completed. 21 each 0   metoprolol  succinate (TOPROL -XL) 50 MG 24 hr tablet Take 50 mg by mouth daily.     omeprazole (PRILOSEC) 40 MG capsule Take 40 mg by mouth daily as needed.     ondansetron  (ZOFRAN ) 8 MG tablet Take 1 tablet (8 mg total) by mouth every 8 (eight) hours as needed for nausea or vomiting. 30 tablet 3   OVER THE COUNTER MEDICATION Take 1 capsule by mouth daily. CBD gummie     oxyCODONE  (OXY IR/ROXICODONE ) 5 MG immediate release tablet Take 1-2 tablets (5-10 mg total) by mouth every 4 (four) hours as needed for severe pain (pain score 7-10). 60 tablet 0   pregabalin (LYRICA) 25 MG capsule Take 1 capsule (25 mg total) by mouth 2 (two) times daily. 60 capsule 0   prochlorperazine  (  COMPAZINE ) 10 MG tablet TAKE ONE TABLET BY MOUTH EVERY 6 HOURS AS NEEDED FOR NAUSEA / VOMITING 30 tablet 2   sucralfate (CARAFATE) 1 g tablet Take 1 tablet (1 g total) by mouth 3 (three) times daily before meals. Dissolve tablet in 4 T warm water swish and swallow 30 minutes before meals. 90 tablet 0   Tetrahydrozoline HCl (VISINE OP) Place 1 drop into both eyes daily as needed (dry eyes).     vitamin B-12 (CYANOCOBALAMIN ) 1000 MCG tablet Take 1,000 mcg by mouth daily.     No current facility-administered medications for this visit.    OBJECTIVE: There were no vitals filed for this visit.     There is no height or weight on file to calculate BMI.    ECOG FS:1 - Symptomatic but completely ambulatory  General: Well-developed, well-nourished, no acute distress.  Sitting in a wheelchair. Eyes: Pink conjunctiva, anicteric sclera. HEENT: Normocephalic, moist mucous membranes. Lungs: No audible wheezing or coughing. Heart: Regular rate and rhythm. Abdomen: Soft, nontender, no obvious distention. Musculoskeletal: No edema, cyanosis, or clubbing. Neuro: Alert, answering all questions appropriately. Cranial nerves grossly intact. Skin: No rashes or petechiae  noted. Psych: Normal affect.  LAB RESULTS:  Lab Results  Component Value Date   NA 118 (LL) 03/16/2024   K 3.9 03/16/2024   CL 81 (L) 03/16/2024   CO2 24 03/16/2024   GLUCOSE 190 (H) 03/16/2024   BUN 20 03/16/2024   CREATININE 0.69 03/16/2024   CALCIUM 8.3 (L) 03/16/2024   PROT 6.8 03/16/2024   ALBUMIN  2.5 (L) 03/16/2024   AST 24 03/16/2024   ALT 19 03/16/2024   ALKPHOS 173 (H) 03/16/2024   BILITOT 0.4 03/16/2024   GFRNONAA >60 03/16/2024   GFRAA >60 09/10/2019    Lab Results  Component Value Date   WBC 17.7 (H) 03/16/2024   NEUTROABS 14.5 (H) 03/16/2024   HGB 10.8 (L) 03/16/2024   HCT 32.7 (L) 03/16/2024   MCV 74.1 (L) 03/16/2024   PLT 411 (H) 03/16/2024     STUDIES: No results found.   ONCOLOGY HISTORY: Patient completed SBRT to the 2.7 cm irregular left lower lobe nodule in July 2024.  PET scan results from February 13, 2023 reviewed independently with residual low level of activity suggesting treatment response in the left lower lobe.  The AP window lymph node seen on subsequent CT scan revealed progressive enlargement now measuring 1.7 x 1.3 cm as well as increased hypermetabolic activity.  Patient completed XRT to this lesion.  Patient has also completed XRT to her left pelvis.  Patient initiated Keytruda  on May 07, 2023.  PET scan on July 08, 2023 revealed progressive disease and treatment was changed to Adagrasib  (Krazati ) 600 mg twice daily given her K-ras mutation.  MRI of the brain on August 22, 2023 revealed a 1 cm left lateral cerebellum lesion.  Patient has completed XRT to this lesion.  Her most recent PET scan on December 21, 2023 revealed progression of disease.   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  PET scan on December 21, 2023 revealed progression of disease.  Recommendation is  to transition to chemotherapy using carboplatinum, Taxol, and Avastin every 3 weeks.  Given patient's significant hyponatremia, will delay treatment 1 week.  Return to clinic in 1 week for further evaluation and reconsideration of  cycle 3. Will reimage with PET scan after the conclusion of cycle 4.   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  Repeat brain MRI in November/December 2025. Hyponatremia: Sodium continues to trend down and is now 118.  Delay treatment as above.  Patient was given a prescription for salt tablets.   Leukocytosis: Likely reactive, monitor. Anemia: Chronic and unchanged.  Patient's hemoglobin is 10.8. Thrombocytosis: Likely reactive, monitor. Shortness of breath/cough: Patient does not complain of this today. Hypertension: Chronic and unchanged.  Continue monitoring treatment per primary care. Lower back pain/sciatica: Patient does not complain of this today. Appears to be musculoskeletal.  Patient was given a referral to Occupational Therapy and home health for physical therapy.  Continue oxycodone  as prescribed.  Appreciate palliative care input.   Neuropathy: Patient does not like gabapentin .  Continue Cymbalta and Lyrica.   Patient expressed understanding and was in agreement with this plan. She also understands that She can call clinic at any time with any questions, concerns, or complaints.    Cancer Staging  Adenocarcinoma of left lung [facility]) Staging form: Lung, AJCC 8th Edition - Clinical stage from 03/19/2023: Stage IVB (cTX, cN2, cM1c) - Signed by Jacobo Evalene PARAS, MD on 03/19/2023 Stage prefix: Initial diagnosis   Evalene PARAS Jacobo, MD   03/17/2024 1:26 PM

## 2024-03-16 NOTE — Progress Notes (Signed)
 Palliative Medicine [facility] at [facility] [phone]) [phone] Fax:(336) [phone]   Name: Hannah Yu Date: 03/16/2024 MRN: [id number]  DOB: 10-Oct-1945  Patient Care Team: Epifanio Alm SQUIBB, MD as PCP - General (Infectious Diseases) Verdene Gills, RN as Oncology Nurse Navigator Lenn Aran, MD as Consulting Physician (Radiation Oncology) Jacobo Evalene PARAS, MD as Consulting Physician (Oncology)    REASON FOR CONSULTATION: Hannah Yu is a 78 y.o. female with multiple medical problems including stage IVb adenocarcinoma of the lung.  Palliative care was consulted to address goals of manage ongoing symptoms.  SOCIAL HISTORY:     reports that she quit smoking about 31 years ago. Her smoking use included cigarettes. She has never used smokeless tobacco. She reports current alcohol use of about 4.0 standard drinks of alcohol per week. She reports that she does not use drugs.  Patient is unmarried.  Has no children.  Lives at home with her brother.  Patient previously worked in office.  ADVANCE DIRECTIVES:  Not on file  CODE STATUS:   PAST MEDICAL HISTORY: Past Medical History:  Diagnosis Date   Acid reflux    Anemia    Anesthesia complication    Tachycardia previously, now on metoprolol    Arthritis    09/02/2019: per patient have it real bad in both hands and back   Difficult intubation    had to terminate intubation unable to advance tube for cholecystectomy 2017?   DVT (deep venous thrombosis) (HCC)    leg   Hypertension    Sciatica    09/02/2019: has had it for about 3 years    PAST SURGICAL HISTORY:  Past Surgical History:  Procedure Laterality Date   ABDOMINAL HYSTERECTOMY     CATARACT EXTRACTION W/ INTRAOCULAR LENS IMPLANT Bilateral 2007   eyes done within 1 month of each other.   IR IMAGING GUIDED PORT INSERTION  05/19/2023   SHOULDER ARTHROSCOPY WITH OPEN ROTATOR CUFF REPAIR Right 2012   TOTAL HIP  ARTHROPLASTY Right 09/06/2019   TOTAL HIP ARTHROPLASTY Right 09/06/2019   Procedure: RIGHT TOTAL HIP ARTHROPLASTY ANTERIOR APPROACH;  Surgeon: Vernetta Lonni GRADE, MD;  Location: [facility] OR;  Service: Orthopedics;  Laterality: Right;   TOTAL HIP ARTHROPLASTY Left 07/31/2020   Procedure: LEFT TOTAL HIP ARTHROPLASTY ANTERIOR APPROACH;  Surgeon: Vernetta Lonni GRADE, MD;  Location: [facility] OR;  Service: Orthopedics;  Laterality: Left;   VIDEO BRONCHOSCOPY WITH ENDOBRONCHIAL ULTRASOUND N/A 10/06/2022   Procedure: VIDEO BRONCHOSCOPY WITH ENDOBRONCHIAL ULTRASOUND;  Surgeon: Parris Manna, MD;  Location: [facility] ORS;  Service: Thoracic;  Laterality: N/A;    HEMATOLOGY/ONCOLOGY HISTORY:  Oncology History  Adenocarcinoma of left lung (HCC)  03/19/2023 Initial Diagnosis   Adenocarcinoma of left lung (HCC)   03/19/2023 Cancer Staging   Staging form: Lung, AJCC 8th Edition - Clinical stage from 03/19/2023: Stage IVB (cTX, cN2, cM1c) - Signed by Jacobo Evalene PARAS, MD on 03/19/2023 Stage prefix: Initial diagnosis   05/07/2023 - 07/16/2023 Chemotherapy   Patient is on Treatment Plan : LUNG NSCLC Pembrolizumab  (200) q21d     02/03/2024 -  Chemotherapy   Patient is on Treatment Plan : LUNG NSCLC Carboplatin + Paclitaxel + Bevacizumab q21d       ALLERGIES:  is allergic to ezetimibe, codeine, cortisone, and lovastatin.  MEDICATIONS:  Current Outpatient Medications  Medication Sig Dispense Refill   aspirin  EC 81 MG tablet Take 162 mg by mouth daily. Swallow whole.     Biotin 10 MG CAPS  cholecalciferol  (VITAMIN D3) 25 MCG (1000 UNIT) tablet Take 1,000 Units by mouth daily.     diphenoxylate -atropine  (LOMOTIL ) 2.5-0.025 MG tablet Take 1 tablet by mouth 4 (four) times daily as needed for diarrhea or loose stools. 60 tablet 2   DULoxetine (CYMBALTA) 30 MG capsule Take 1 capsule (30 mg total) by mouth daily. 30 capsule 3   FIBER PO Take 1 tablet by mouth daily.     furosemide  (LASIX ) 40 MG tablet Take 40 mg  by mouth daily. 40 mg alternating 80 mg every other day     ibuprofen (ADVIL) 200 MG tablet Take 400 mg by mouth every 6 (six) hours as needed for mild pain or moderate pain.     Ibuprofen-diphenhydrAMINE  HCl (IBUPROFEN PM) 200-25 MG CAPS Take 2 tablets by mouth at bedtime as needed (sleep).     LORazepam (ATIVAN) 0.5 MG tablet Take by mouth.     losartan  (COZAAR ) 100 MG tablet Take 100 mg by mouth daily.     magic mouthwash (multi-ingredient) oral suspension Swish and swallow 5-10 mLs 4 (four) times daily as needed. 480 mL 3   magic mouthwash w/lidocaine  SOLN Take 5 mLs by mouth 4 (four) times daily. Suspension contains equal amounts of Maalox Extra Strength, nystatin, diphenhydramine  and lidocaine . 480 mL 0   magnesium oxide (MAG-OX) 400 (240 Mg) MG tablet Take 400 mg by mouth daily.     methylPREDNISolone  (MEDROL  DOSEPAK) 4 MG TBPK tablet Start with 6 tablets for 1st day and then reduce by one tablet daily until completed. 21 each 0   metoprolol  succinate (TOPROL -XL) 50 MG 24 hr tablet Take 50 mg by mouth daily.     omeprazole (PRILOSEC) 40 MG capsule Take 40 mg by mouth daily as needed.     ondansetron  (ZOFRAN ) 8 MG tablet Take 1 tablet (8 mg total) by mouth every 8 (eight) hours as needed for nausea or vomiting. 30 tablet 3   OVER THE COUNTER MEDICATION Take 1 capsule by mouth daily. CBD gummie     oxyCODONE  (OXY IR/ROXICODONE ) 5 MG immediate release tablet Take 1-2 tablets (5-10 mg total) by mouth every 4 (four) hours as needed for severe pain (pain score 7-10). 60 tablet 0   prochlorperazine  (COMPAZINE ) 10 MG tablet TAKE ONE TABLET BY MOUTH EVERY 6 HOURS AS NEEDED FOR NAUSEA / VOMITING 30 tablet 2   sucralfate (CARAFATE) 1 g tablet Take 1 tablet (1 g total) by mouth 3 (three) times daily before meals. Dissolve tablet in 4 T warm water swish and swallow 30 minutes before meals. 90 tablet 0   Tetrahydrozoline HCl (VISINE OP) Place 1 drop into both eyes daily as needed (dry eyes).     vitamin  B-12 (CYANOCOBALAMIN ) 1000 MCG tablet Take 1,000 mcg by mouth daily.     No current facility-administered medications for this visit.    VITAL SIGNS: There were no vitals taken for this visit. There were no vitals filed for this visit.  Estimated body mass index is 31.91 kg/m as calculated from the following:   Height as of 03/07/24: 5' (1.524 m).   Weight as of 03/07/24: 163 lb 6.4 oz (74.1 kg).  LABS: CBC:    Component Value Date/Time   WBC 17.7 (H) 03/16/2024 0830   WBC 30.8 (H) 02/11/2024 0828   HGB 10.8 (L) 03/16/2024 0830   HCT 32.7 (L) 03/16/2024 0830   PLT 411 (H) 03/16/2024 0830   MCV 74.1 (L) 03/16/2024 0830   NEUTROABS 14.5 (H) 03/16/2024 0830  LYMPHSABS 0.9 03/16/2024 0830   MONOABS 1.5 (H) 03/16/2024 0830   EOSABS 0.0 03/16/2024 0830   BASOSABS 0.1 03/16/2024 0830   Comprehensive Metabolic Panel:    Component Value Date/Time   NA 122 (L) 03/07/2024 1438   K 4.1 03/07/2024 1438   CL 88 (L) 03/07/2024 1438   CO2 23 03/07/2024 1438   BUN 20 03/07/2024 1438   CREATININE 0.76 03/07/2024 1438   GLUCOSE 128 (H) 03/07/2024 1438   CALCIUM 8.4 (L) 03/07/2024 1438   AST 21 02/24/2024 0854   ALT 16 02/24/2024 0854   ALKPHOS 140 (H) 02/24/2024 0854   BILITOT 0.4 02/24/2024 0854   PROT 7.0 02/24/2024 0854   ALBUMIN  2.7 (L) 02/24/2024 0854    RADIOGRAPHIC STUDIES: No results found.  PERFORMANCE STATUS (ECOG) : 1 - Symptomatic but completely ambulatory  Review of Systems Unless otherwise noted, a complete review of systems is negative.  Physical Exam General: NAD Cardiovascular: regular rate and rhythm Pulmonary: clear ant fields Abdomen: soft, nontender, + bowel sounds GU: no suprapubic tenderness Extremities: no edema, no joint deformities Skin: no rashes Neurological: Weakness but otherwise nonfocal  IMPRESSION: Follow-up visit.    Patient continues to endorse severe peripheral neuropathy in her feet.  It hurts when she stands and therefore mobility  has been extremely limited.  Patient was previously on gabapentin  but did not find this beneficial, although likely dose was not maximized.  I started her on duloxetine the patient reports that this has not yet helped.  Discussed option of increasing duloxetine to 60 mg daily versus also starting her on pregabalin.  We also discussed possible future referral to neuro-oncology if symptoms remain poorly controlled.  Patient remains weak.  Will refer to home health PT.  Patient reports oral pain is improved on Magic mouthwash with lidocaine .  She does still continue to have some pain with swallowing.  Patient appears to have continued ulceration of her tongue.  Continue Magic mouthwash and will restart sucralfate.  Could consider oral steroid mouth rinse if needed.  Referral to nutrition.  Patient says she is not interested in completing MOST Form or discussing end-of-life planning.  She has previously completed healthcare power of attorney documents and says that she will bring those in to be scanned into the chart.  Her brother is reportedly her HCPOA.  PLAN: -Continue current scope of treatment - Continue oxycodone  - Continue duloxetine  - Start pregabalin 25 mg twice daily - Daily bowel regimen - Sucralfate and Magic mouthwash - Referral to Home health PT/rehab screening - Referral to nutrition - Patient to bring in ACP documents to be scanned - Patient not interested in completing MOST form - Follow-up 1 month  Case and plan discussed Dr. Jacobo   Patient expressed understanding and was in agreement with this plan. She also understands that She can call the clinic at any time with any questions, concerns, or complaints.     Time Total: 20 minutes  Visit consisted of counseling and education dealing with the complex and emotionally intense issues of symptom management and palliative care in the setting of serious and potentially life-threatening illness.Greater than 50%  of this time  was spent counseling and coordinating care related to the above assessment and plan.  Signed by: Fonda Mower, PhD, NP-C

## 2024-03-16 NOTE — Patient Instructions (Signed)

## 2024-03-16 NOTE — Progress Notes (Signed)
 Patient is having some pain at about an 8 today, the pain medication isn't helping. The neuropathy is very bad, she can't even walk anymore. Above the knee. Magic mouth wash isn't really doing anything for the pain she has in her throat.

## 2024-03-17 ENCOUNTER — Encounter: Payer: Self-pay | Admitting: Oncology

## 2024-03-17 ENCOUNTER — Other Ambulatory Visit: Payer: Self-pay | Admitting: Oncology

## 2024-03-17 ENCOUNTER — Ambulatory Visit
Admission: RE | Admit: 2024-03-17 | Discharge: 2024-03-17 | Disposition: A | Discharge status: Home / Self Care | Source: Ambulatory Visit | Attending: Radiation Oncology | Admitting: Radiation Oncology

## 2024-03-17 ENCOUNTER — Other Ambulatory Visit: Payer: Self-pay

## 2024-03-17 DIAGNOSIS — Z510 Encounter for antineoplastic radiation therapy: Secondary | ICD-10-CM | POA: Diagnosis not present

## 2024-03-17 LAB — RAD ONC ARIA SESSION SUMMARY
Course Elapsed Days: 3
Plan Fractions Treated to Date: 2
Plan Prescribed Dose Per Fraction: 6 Gy
Plan Total Fractions Prescribed: 5
Plan Total Prescribed Dose: 30 Gy
Reference Point Dosage Given to Date: 12 Gy
Reference Point Session Dosage Given: 6 Gy
Session Number: 2

## 2024-03-18 ENCOUNTER — Emergency Department

## 2024-03-18 ENCOUNTER — Encounter: Payer: Self-pay | Admitting: Hospice and Palliative Medicine

## 2024-03-18 ENCOUNTER — Other Ambulatory Visit: Payer: Self-pay

## 2024-03-18 ENCOUNTER — Inpatient Hospital Stay
Admission: EM | Admit: 2024-03-18 | Discharge: 2024-04-04 | DRG: 871 | Disposition: E | Attending: Internal Medicine | Admitting: Internal Medicine

## 2024-03-18 ENCOUNTER — Inpatient Hospital Stay

## 2024-03-18 DIAGNOSIS — Z96642 Presence of left artificial hip joint: Secondary | ICD-10-CM | POA: Diagnosis present

## 2024-03-18 DIAGNOSIS — N179 Acute kidney failure, unspecified: Secondary | ICD-10-CM | POA: Diagnosis present

## 2024-03-18 DIAGNOSIS — J189 Pneumonia, unspecified organism: Secondary | ICD-10-CM | POA: Diagnosis present

## 2024-03-18 DIAGNOSIS — E785 Hyperlipidemia, unspecified: Secondary | ICD-10-CM | POA: Diagnosis present

## 2024-03-18 DIAGNOSIS — I428 Other cardiomyopathies: Secondary | ICD-10-CM | POA: Diagnosis present

## 2024-03-18 DIAGNOSIS — E669 Obesity, unspecified: Secondary | ICD-10-CM | POA: Diagnosis present

## 2024-03-18 DIAGNOSIS — I82441 Acute embolism and thrombosis of right tibial vein: Secondary | ICD-10-CM | POA: Diagnosis present

## 2024-03-18 DIAGNOSIS — J9601 Acute respiratory failure with hypoxia: Secondary | ICD-10-CM | POA: Diagnosis not present

## 2024-03-18 DIAGNOSIS — W19XXXA Unspecified fall, initial encounter: Secondary | ICD-10-CM | POA: Diagnosis present

## 2024-03-18 DIAGNOSIS — R443 Hallucinations, unspecified: Secondary | ICD-10-CM | POA: Diagnosis not present

## 2024-03-18 DIAGNOSIS — M545 Low back pain, unspecified: Secondary | ICD-10-CM | POA: Diagnosis present

## 2024-03-18 DIAGNOSIS — Z9181 History of falling: Secondary | ICD-10-CM

## 2024-03-18 DIAGNOSIS — I480 Paroxysmal atrial fibrillation: Secondary | ICD-10-CM | POA: Diagnosis present

## 2024-03-18 DIAGNOSIS — Z515 Encounter for palliative care: Secondary | ICD-10-CM

## 2024-03-18 DIAGNOSIS — I272 Pulmonary hypertension, unspecified: Secondary | ICD-10-CM | POA: Diagnosis present

## 2024-03-18 DIAGNOSIS — I2692 Saddle embolus of pulmonary artery without acute cor pulmonale: Secondary | ICD-10-CM | POA: Diagnosis present

## 2024-03-18 DIAGNOSIS — I10 Essential (primary) hypertension: Secondary | ICD-10-CM | POA: Diagnosis present

## 2024-03-18 DIAGNOSIS — Z888 Allergy status to other drugs, medicaments and biological substances status: Secondary | ICD-10-CM

## 2024-03-18 DIAGNOSIS — Z90710 Acquired absence of both cervix and uterus: Secondary | ICD-10-CM

## 2024-03-18 DIAGNOSIS — I878 Other specified disorders of veins: Secondary | ICD-10-CM | POA: Diagnosis present

## 2024-03-18 DIAGNOSIS — R627 Adult failure to thrive: Secondary | ICD-10-CM | POA: Diagnosis present

## 2024-03-18 DIAGNOSIS — R6520 Severe sepsis without septic shock: Secondary | ICD-10-CM | POA: Diagnosis present

## 2024-03-18 DIAGNOSIS — I071 Rheumatic tricuspid insufficiency: Secondary | ICD-10-CM | POA: Diagnosis present

## 2024-03-18 DIAGNOSIS — D509 Iron deficiency anemia, unspecified: Secondary | ICD-10-CM | POA: Diagnosis present

## 2024-03-18 DIAGNOSIS — Z9842 Cataract extraction status, left eye: Secondary | ICD-10-CM

## 2024-03-18 DIAGNOSIS — E222 Syndrome of inappropriate secretion of antidiuretic hormone: Secondary | ICD-10-CM | POA: Diagnosis present

## 2024-03-18 DIAGNOSIS — Z9841 Cataract extraction status, right eye: Secondary | ICD-10-CM

## 2024-03-18 DIAGNOSIS — T451X5A Adverse effect of antineoplastic and immunosuppressive drugs, initial encounter: Secondary | ICD-10-CM | POA: Diagnosis present

## 2024-03-18 DIAGNOSIS — Z87891 Personal history of nicotine dependence: Secondary | ICD-10-CM

## 2024-03-18 DIAGNOSIS — Z7901 Long term (current) use of anticoagulants: Secondary | ICD-10-CM

## 2024-03-18 DIAGNOSIS — C7931 Secondary malignant neoplasm of brain: Secondary | ICD-10-CM | POA: Diagnosis present

## 2024-03-18 DIAGNOSIS — Z66 Do not resuscitate: Secondary | ICD-10-CM | POA: Diagnosis not present

## 2024-03-18 DIAGNOSIS — K922 Gastrointestinal hemorrhage, unspecified: Secondary | ICD-10-CM | POA: Diagnosis not present

## 2024-03-18 DIAGNOSIS — E872 Acidosis, unspecified: Secondary | ICD-10-CM | POA: Diagnosis present

## 2024-03-18 DIAGNOSIS — I371 Nonrheumatic pulmonary valve insufficiency: Secondary | ICD-10-CM | POA: Diagnosis present

## 2024-03-18 DIAGNOSIS — Z923 Personal history of irradiation: Secondary | ICD-10-CM

## 2024-03-18 DIAGNOSIS — G620 Drug-induced polyneuropathy: Secondary | ICD-10-CM | POA: Diagnosis present

## 2024-03-18 DIAGNOSIS — Z1152 Encounter for screening for COVID-19: Secondary | ICD-10-CM

## 2024-03-18 DIAGNOSIS — A419 Sepsis, unspecified organism: Secondary | ICD-10-CM | POA: Diagnosis present

## 2024-03-18 DIAGNOSIS — M4854XA Collapsed vertebra, not elsewhere classified, thoracic region, initial encounter for fracture: Secondary | ICD-10-CM | POA: Diagnosis present

## 2024-03-18 DIAGNOSIS — R5381 Other malaise: Secondary | ICD-10-CM | POA: Diagnosis present

## 2024-03-18 DIAGNOSIS — I2699 Other pulmonary embolism without acute cor pulmonale: Secondary | ICD-10-CM

## 2024-03-18 DIAGNOSIS — C3492 Malignant neoplasm of unspecified part of left bronchus or lung: Secondary | ICD-10-CM | POA: Diagnosis present

## 2024-03-18 DIAGNOSIS — Z79899 Other long term (current) drug therapy: Secondary | ICD-10-CM

## 2024-03-18 DIAGNOSIS — Z8673 Personal history of transient ischemic attack (TIA), and cerebral infarction without residual deficits: Secondary | ICD-10-CM

## 2024-03-18 DIAGNOSIS — Z7982 Long term (current) use of aspirin: Secondary | ICD-10-CM

## 2024-03-18 DIAGNOSIS — R531 Weakness: Secondary | ICD-10-CM | POA: Diagnosis present

## 2024-03-18 DIAGNOSIS — R570 Cardiogenic shock: Secondary | ICD-10-CM | POA: Diagnosis present

## 2024-03-18 DIAGNOSIS — I890 Lymphedema, not elsewhere classified: Secondary | ICD-10-CM | POA: Diagnosis present

## 2024-03-18 DIAGNOSIS — Z803 Family history of malignant neoplasm of breast: Secondary | ICD-10-CM

## 2024-03-18 DIAGNOSIS — Z96643 Presence of artificial hip joint, bilateral: Secondary | ICD-10-CM | POA: Diagnosis present

## 2024-03-18 DIAGNOSIS — I255 Ischemic cardiomyopathy: Secondary | ICD-10-CM | POA: Diagnosis present

## 2024-03-18 DIAGNOSIS — Z9221 Personal history of antineoplastic chemotherapy: Secondary | ICD-10-CM

## 2024-03-18 DIAGNOSIS — G9341 Metabolic encephalopathy: Secondary | ICD-10-CM | POA: Diagnosis not present

## 2024-03-18 DIAGNOSIS — D75839 Thrombocytosis, unspecified: Secondary | ICD-10-CM | POA: Diagnosis not present

## 2024-03-18 DIAGNOSIS — K121 Other forms of stomatitis: Secondary | ICD-10-CM | POA: Diagnosis present

## 2024-03-18 DIAGNOSIS — M5430 Sciatica, unspecified side: Secondary | ICD-10-CM | POA: Diagnosis present

## 2024-03-18 DIAGNOSIS — I82451 Acute embolism and thrombosis of right peroneal vein: Secondary | ICD-10-CM | POA: Diagnosis present

## 2024-03-18 DIAGNOSIS — I519 Heart disease, unspecified: Secondary | ICD-10-CM

## 2024-03-18 DIAGNOSIS — Z6833 Body mass index (BMI) 33.0-33.9, adult: Secondary | ICD-10-CM

## 2024-03-18 DIAGNOSIS — C7951 Secondary malignant neoplasm of bone: Secondary | ICD-10-CM | POA: Diagnosis present

## 2024-03-18 DIAGNOSIS — Z961 Presence of intraocular lens: Secondary | ICD-10-CM | POA: Diagnosis present

## 2024-03-18 DIAGNOSIS — E538 Deficiency of other specified B group vitamins: Secondary | ICD-10-CM | POA: Diagnosis present

## 2024-03-18 DIAGNOSIS — J440 Chronic obstructive pulmonary disease with acute lower respiratory infection: Secondary | ICD-10-CM | POA: Diagnosis present

## 2024-03-18 DIAGNOSIS — Z885 Allergy status to narcotic agent status: Secondary | ICD-10-CM

## 2024-03-18 DIAGNOSIS — Z6835 Body mass index (BMI) 35.0-35.9, adult: Secondary | ICD-10-CM

## 2024-03-18 DIAGNOSIS — K219 Gastro-esophageal reflux disease without esophagitis: Secondary | ICD-10-CM | POA: Diagnosis present

## 2024-03-18 LAB — COMPREHENSIVE METABOLIC PANEL WITH GFR
ALT: 30 U/L (ref 0–44)
AST: 54 U/L — ABNORMAL HIGH (ref 15–41)
Albumin: 3 g/dL — ABNORMAL LOW (ref 3.5–5.0)
Alkaline Phosphatase: 228 U/L — ABNORMAL HIGH (ref 38–126)
Anion gap: 17 — ABNORMAL HIGH (ref 5–15)
BUN: 35 mg/dL — ABNORMAL HIGH (ref 8–23)
CO2: 21 mmol/L — ABNORMAL LOW (ref 22–32)
Calcium: 8.6 mg/dL — ABNORMAL LOW (ref 8.9–10.3)
Chloride: 80 mmol/L — ABNORMAL LOW (ref 98–111)
Creatinine, Ser: 1.05 mg/dL — ABNORMAL HIGH (ref 0.44–1.00)
GFR, Estimated: 54 mL/min — ABNORMAL LOW (ref >60)
Glucose, Bld: 195 mg/dL — ABNORMAL HIGH (ref 70–99)
Potassium: 5 mmol/L (ref 3.5–5.1)
Sodium: 117 mmol/L — CL (ref 135–145)
Total Bilirubin: 0.3 mg/dL (ref 0.0–1.2)
Total Protein: 6.7 g/dL (ref 6.5–8.1)

## 2024-03-18 LAB — CBC WITH DIFFERENTIAL/PLATELET
Abs Immature Granulocytes: 1.09 K/uL — ABNORMAL HIGH (ref 0.00–0.07)
Basophils Absolute: 0.1 K/uL (ref 0.0–0.1)
Basophils Relative: 0 %
Eosinophils Absolute: 0 K/uL (ref 0.0–0.5)
Eosinophils Relative: 0 %
HCT: 30.7 % — ABNORMAL LOW (ref 36.0–46.0)
Hemoglobin: 9.9 g/dL — ABNORMAL LOW (ref 12.0–15.0)
Immature Granulocytes: 4 %
Lymphocytes Relative: 2 %
Lymphs Abs: 0.6 K/uL — ABNORMAL LOW (ref 0.7–4.0)
MCH: 24.6 pg — ABNORMAL LOW (ref 26.0–34.0)
MCHC: 32.2 g/dL (ref 30.0–36.0)
MCV: 76.2 fL — ABNORMAL LOW (ref 80.0–100.0)
Monocytes Absolute: 1.6 K/uL — ABNORMAL HIGH (ref 0.1–1.0)
Monocytes Relative: 6 %
Neutro Abs: 24 K/uL — ABNORMAL HIGH (ref 1.7–7.7)
Neutrophils Relative %: 88 %
Platelets: 465 K/uL — ABNORMAL HIGH (ref 150–400)
RBC: 4.03 MIL/uL (ref 3.87–5.11)
RDW: 22.4 % — ABNORMAL HIGH (ref 11.5–15.5)
Smear Review: NORMAL
WBC: 27.3 K/uL — ABNORMAL HIGH (ref 4.0–10.5)
nRBC: 0 % (ref 0.0–0.2)

## 2024-03-18 LAB — PROCALCITONIN: Procalcitonin: 11.5 ng/mL

## 2024-03-18 LAB — TROPONIN T, HIGH SENSITIVITY
Troponin T High Sensitivity: 349 ng/L (ref 0–19)
Troponin T High Sensitivity: 365 ng/L (ref 0–19)

## 2024-03-18 LAB — LACTIC ACID, PLASMA
Lactic Acid, Venous: 4.6 mmol/L (ref 0.5–1.9)
Lactic Acid, Venous: 2.7 mmol/L (ref 0.5–1.9)

## 2024-03-18 LAB — MAGNESIUM: Magnesium: 2.1 mg/dL (ref 1.7–2.4)

## 2024-03-18 LAB — PROTIME-INR
INR: 1.1 (ref 0.8–1.2)
Prothrombin Time: 14.6 s (ref 11.4–15.2)

## 2024-03-18 LAB — APTT: aPTT: 26 s (ref 24–36)

## 2024-03-18 LAB — PRO BRAIN NATRIURETIC PEPTIDE: Pro Brain Natriuretic Peptide: 26777 pg/mL — ABNORMAL HIGH (ref <300.0)

## 2024-03-18 MED ORDER — IOHEXOL 350 MG/ML SOLN
75.0000 mL | Freq: Once | INTRAVENOUS | Status: AC | PRN
  Administered 2024-03-18: 75 mL via INTRAVENOUS

## 2024-03-18 MED ORDER — NOREPINEPHRINE 4 MG/250ML-% IV SOLN
0.0000 ug/min | INTRAVENOUS | Status: DC
  Administered 2024-03-19: 2 ug/min via INTRAVENOUS
  Filled 2024-03-18: qty 250

## 2024-03-18 MED ORDER — DOCUSATE SODIUM 100 MG PO CAPS
100.0000 mg | ORAL_CAPSULE | Freq: Two times a day (BID) | ORAL | Status: DC | PRN
  Administered 2024-03-21 – 2024-03-25 (×3): 100 mg via ORAL
  Filled 2024-03-18 (×3): qty 1

## 2024-03-18 MED ORDER — HEPARIN BOLUS VIA INFUSION
3700.0000 [IU] | Freq: Once | INTRAVENOUS | Status: AC
  Administered 2024-03-18: 3700 [IU] via INTRAVENOUS
  Filled 2024-03-18: qty 3700

## 2024-03-18 MED ORDER — CHLORHEXIDINE GLUCONATE CLOTH 2 % EX PADS
6.0000 | MEDICATED_PAD | Freq: Every day | CUTANEOUS | Status: DC
  Administered 2024-03-19 – 2024-04-03 (×16): 6 via TOPICAL

## 2024-03-18 MED ORDER — SODIUM CHLORIDE 0.9 % IV SOLN
500.0000 mg | Freq: Once | INTRAVENOUS | Status: AC
  Administered 2024-03-18: 500 mg via INTRAVENOUS
  Filled 2024-03-18: qty 5

## 2024-03-18 MED ORDER — HEPARIN SODIUM (PORCINE) 5000 UNIT/ML IJ SOLN: 4000.0000 [IU] | Freq: Once | INTRAMUSCULAR | Status: DC

## 2024-03-18 MED ORDER — SODIUM CHLORIDE 0.9 % IV SOLN
2.0000 g | Freq: Once | INTRAVENOUS | Status: AC
  Administered 2024-03-18: 2 g via INTRAVENOUS
  Filled 2024-03-18: qty 20

## 2024-03-18 MED ORDER — SODIUM CHLORIDE 0.9 % IV BOLUS
2000.0000 mL | Freq: Once | INTRAVENOUS | Status: AC
  Administered 2024-03-18: 2000 mL via INTRAVENOUS

## 2024-03-18 MED ORDER — HEPARIN (PORCINE) 25000 UT/250ML-% IV SOLN
1650.0000 [IU]/h | INTRAVENOUS | Status: DC
  Administered 2024-03-18 – 2024-03-19 (×2): 950 [IU]/h via INTRAVENOUS
  Administered 2024-03-19: 1100 [IU]/h via INTRAVENOUS
  Administered 2024-03-20: 1350 [IU]/h via INTRAVENOUS
  Administered 2024-03-21 – 2024-03-23 (×4): 1550 [IU]/h via INTRAVENOUS
  Filled 2024-03-18 (×9): qty 250

## 2024-03-18 MED ORDER — POLYETHYLENE GLYCOL 3350 17 G PO PACK
17.0000 g | PACK | Freq: Every day | ORAL | Status: DC | PRN
  Administered 2024-03-20 – 2024-04-02 (×5): 17 g via ORAL
  Filled 2024-03-18 (×6): qty 1

## 2024-03-18 NOTE — ED Provider Notes (Signed)
 [facility] Provider Note    Event Date/Time   First MD Initiated Contact with Patient 03/18/24 1825     (approximate)   History   Shortness of Breath and Fall   HPI  Hannah Yu is a 78 y.o. female who presents to the ED for evaluation of Shortness of Breath and Fall   I reviewed oncology clinic visit from 2 days ago.  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  Brain mets, hyponatremia with recent 118  Patient presents to the ED with hypoxia, shortness of breath and a fall.  EMS reports they are called out frequently to her residence for lift assistance after patient slides out of chairs.  They were called for the same today and found her hypoxic to the 60s on room air improved with CPAP.  Patient reports leg weakness and her legs give out frequently.  Reports shortness of breath improved with EMS CPAP and transition to our BiPAP.   Physical Exam   Triage Vital Signs: ED Triage Vitals [03/18/24 1823]  Encounter Vitals Group     BP      Girls Systolic BP Percentile      Girls Diastolic BP Percentile      Boys Systolic BP Percentile      Boys Diastolic BP Percentile      Pulse      Resp      Temp      Temp src      SpO2 92 %     Weight      Height      Head Circumference      Peak Flow      Pain Score      Pain Loc      Pain Education      Exclude from Growth Chart     Most recent vital signs: Vitals:   03/18/24 2130 03/18/24 2230  BP: (!) 84/59 90/68  Pulse: (!) 112 91  Resp: 20 16  Temp:    SpO2: 90% 93%    General: Awake, no distress.  CV:  Good peripheral perfusion.  Tachycardic and irregular, A-fib on the monitor Resp:  Tachypneic, no wheezing, no distress, pulling good volumes on BiPAP Abd:  No distention.  Nontender MSK:  No deformity noted.  Neuro:  No focal deficits appreciated. Other:     ED Results / Procedures / Treatments   Labs (all labs ordered are listed, but only abnormal results are  displayed) Labs Reviewed  LACTIC ACID, PLASMA - Abnormal; Notable for the following components:      Result Value   Lactic Acid, Venous 4.6 (*)    All other components within normal limits  LACTIC ACID, PLASMA - Abnormal; Notable for the following components:   Lactic Acid, Venous 2.7 (*)    All other components within normal limits  COMPREHENSIVE METABOLIC PANEL WITH GFR - Abnormal; Notable for the following components:   Sodium 117 (*)    Chloride 80 (*)    CO2 21 (*)    Glucose, Bld 195 (*)    BUN 35 (*)    Creatinine, Ser 1.05 (*)    Calcium 8.6 (*)    Albumin  3.0 (*)    AST 54 (*)    Alkaline Phosphatase 228 (*)    GFR, Estimated 54 (*)    Anion gap 17 (*)    All other components within normal limits  CBC WITH DIFFERENTIAL/PLATELET - Abnormal; Notable for the  following components:   WBC 27.3 (*)    Hemoglobin 9.9 (*)    HCT 30.7 (*)    MCV 76.2 (*)    MCH 24.6 (*)    RDW 22.4 (*)    Platelets 465 (*)    Neutro Abs 24.0 (*)    Lymphs Abs 0.6 (*)    Monocytes Absolute 1.6 (*)    Abs Immature Granulocytes 1.09 (*)    All other components within normal limits  PRO BRAIN NATRIURETIC PEPTIDE - Abnormal; Notable for the following components:   Pro Brain Natriuretic Peptide 26,777.0 (*)    All other components within normal limits  TROPONIN T, HIGH SENSITIVITY - Abnormal; Notable for the following components:   Troponin T High Sensitivity 349 (*)    All other components within normal limits  TROPONIN T, HIGH SENSITIVITY - Abnormal; Notable for the following components:   Troponin T High Sensitivity 365 (*)    All other components within normal limits  CULTURE, BLOOD (ROUTINE X 2)  CULTURE, BLOOD (ROUTINE X 2)  MRSA NEXT GEN BY PCR, NASAL  PROTIME-INR  MAGNESIUM  APTT  PROCALCITONIN  URINALYSIS, ROUTINE W REFLEX MICROSCOPIC  PHOSPHORUS  MAGNESIUM  BASIC METABOLIC PANEL WITH GFR  CBC    EKG A-fib with RVR, rate of 145 bpm.  Normal axis and intervals.  No  STEMI.  RADIOLOGY CTA chest interpreted by me with small PE CXR interpreted by me with signs of pneumonia  Official radiology report(s): CT Angio Chest PE W and/or Wo Contrast Result Date: 03/18/2024 EXAM: CTA CHEST 03/18/2024 09:56:31 PM TECHNIQUE: CTA of the chest was performed after the administration of 75 mL of iohexol  (OMNIPAQUE ) 350 MG/ML injection. Multiplanar reformatted images are provided for review. MIP images are provided for review. Automated exposure control, iterative reconstruction, and/or weight based adjustment of the mA/kV was utilized to reduce the radiation dose to as low as reasonably achievable. COMPARISON: PET CT dated 12/21/2023 CLINICAL HISTORY: eval PE. lung cancer patient, acutely hypoxic, tachy. FINDINGS: PULMONARY ARTERIES: Pulmonary arteries are adequately opacified for evaluation. Segmental/subsegmental emboli in the right middle and lower lobe pulmonary arteries (images 57, 65, 66, and 70). Overall clot burden is small to moderate. Main pulmonary artery is normal in caliber. MEDIASTINUM: Right chest port terminates at the cavoatrial junction. Cardiomegaly. The pericardium demonstrates no acute abnormality. Mild thoracic aortic atherosclerosis. LYMPH NODES: No mediastinal, hilar or axillary lymphadenopathy. LUNGS AND PLEURA: Multifocal patchy/nodular opacities in the left lung reflecting a combination of atelectasis and tumor, including a 2.0 cm perifissural nodule along the left fissure (image 34) and 4.5 cm pleural-based mass in the posterior left lower hemithorax (image 87). Overall grossly unchanged. Trace left pleural effusion. No pneumothorax. UPPER ABDOMEN: Cholelithiasis. Limited images of the upper abdomen demonstrate cholelithiasis. SOFT TISSUES AND BONES: Severe compression fracture deformity at T6 (sagittal image 81), new. Known multifocal osseous metastases are not evident on CT, although hypermetabolic on PET. No acute soft tissue abnormality. IMPRESSION: 1.  Acute segmental/subsegmental pulmonary emboli in the right middle and lower lobe arteries. Small to moderate clot burden. 2. New severe T6 compression fracture deformity. 3. Left lung multifocal opacities reflecting atelectasis and tumor, overall grossly unchanged. 4. Known osseous metastases are better evaluated on PET. 5. Critical Value/emergent results were called by telephone at the time of interpretation on 03/18/2024 at 2215 hrs to provider Dr Claudene. Electronically signed by: Pinkie Pebbles MD 03/18/2024 10:19 PM EST RP Workstation: [id number]   DG Chest Port 1 View Result Date: 03/18/2024  EXAM: 1 VIEW(S) XRAY OF THE CHEST 03/18/2024 07:05:00 PM COMPARISON: 07/22/2023 CLINICAL HISTORY: Questionable sepsis - evaluate for abnormality FINDINGS: LINES, TUBES AND DEVICES: Right chest wall port stable in position. LUNGS AND PLEURA: Small left pleural effusion. Retrocardiac airspace opacity. Developing left upper lobe airspace opacity. No pneumothorax. HEART AND MEDIASTINUM: Cardiomegaly, unchanged. Aortic arch atherosclerosis. BONES AND SOFT TISSUES: Rotator cuff anchor suture along the right shoulder noted. No acute osseous abnormality. IMPRESSION: 1. Developing left upper lobe airspace opacity and retrocardiac airspace opacity, suspicious for pneumonia or aspiration. Follow-up PA and lateral chest x-ray is recommended in 3-4 weeks following therapy to ensure resolution and exclude underlying malignancy. 2. Small left pleural effusion with associated retroperitoneal airspace opacity . Electronically signed by: Morgane Naveau MD 03/18/2024 07:27 PM EST RP Workstation: [id number]    PROCEDURES and INTERVENTIONS:  .Critical Care  Performed by: Claudene Rover, MD Authorized by: Claudene Rover, MD   Critical care provider statement:    Critical care time (minutes):  45   Critical care time was exclusive of:  Separately billable procedures and treating other patients   Critical care was necessary to treat  or prevent imminent or life-threatening deterioration of the following conditions:  Circulatory failure, cardiac failure and respiratory failure   Critical care was time spent personally by me on the following activities:  Development of treatment plan with patient or surrogate, discussions with consultants, evaluation of patient's response to treatment, examination of patient, ordering and review of laboratory studies, ordering and review of radiographic studies, ordering and performing treatments and interventions, pulse oximetry, re-evaluation of patient's condition and review of old charts .1-3 Lead EKG Interpretation  Performed by: Claudene Rover, MD Authorized by: Claudene Rover, MD     Interpretation: abnormal     ECG rate:  141   ECG rate assessment: tachycardic     Rhythm: atrial fibrillation     Ectopy: none     Conduction: normal     Medications  azithromycin (ZITHROMAX) 500 mg in sodium chloride  0.9 % 250 mL IVPB (500 mg Intravenous New Bag/Given 03/18/24 2301)  heparin  ADULT infusion 100 units/mL (25000 units/250mL) (950 Units/hr Intravenous New Bag/Given 03/18/24 2305)  Chlorhexidine  Gluconate Cloth 2 % PADS 6 each (has no administration in time range)  docusate sodium  (COLACE) capsule 100 mg (has no administration in time range)  polyethylene glycol (MIRALAX / GLYCOLAX) packet 17 g (has no administration in time range)  norepinephrine (LEVOPHED) 4mg  in 250mL (0.016 mg/mL) premix infusion (has no administration in time range)  cefTRIAXone (ROCEPHIN) 2 g in sodium chloride  0.9 % 100 mL IVPB (0 g Intravenous Stopped 03/18/24 2202)  sodium chloride  0.9 % bolus 2,000 mL (2,000 mLs Intravenous New Bag/Given 03/18/24 2025)  iohexol  (OMNIPAQUE ) 350 MG/ML injection 75 mL (75 mLs Intravenous Contrast Given 03/18/24 2148)  heparin  bolus via infusion 3,700 Units (3,700 Units Intravenous Bolus from Bag 03/18/24 2305)     IMPRESSION / MDM / ASSESSMENT AND PLAN / ED COURSE  I reviewed the  triage vital signs and the nursing notes.  Differential diagnosis includes, but is not limited to, ACS, PTX, PNA, muscle strain/spasm, PE, dissection, anxiety, pleural effusion  {Patient presents with symptoms of an acute illness or injury that is potentially life-threatening.  Advanced lung cancer patient presents acutely short of breath and hypoxic with signs of multiple pathologies: Acute small PE, new onset A-fib with RVR, NSTEMI, pneumonia and sepsis, hypoxia requiring ICU admission.  She is tachycardic with A-fib which appears to be new onset.  Her pain is normotensive with soft pressures are positive and IV fluids in the indication for vasopressors at this point.  Pulling good volumes on BiPAP, no wheezing but borderline oxygenation with sats in the low 90s.  Elevated troponins likely multifactorial in the setting of PE, respiratory status, severe sepsis, A-fib with RVR.  Initiate heparin  drip due to these troponins and PE seen on CT.  Leukocytosis, elevated procalcitonin and CXR indicate antibiotics for CAP coverage.  Mildly worse hyponatremia superimposed on her chronic hyponatremia.  Consult ICU who agrees to admit  Clinical Course as of 03/18/24 2345  Fri Mar 18, 2024  [id number] [place]ell, family at the bedside, brother and good friend.  Get some supplemental history.  No baseline oxygen or COPD.  Acutely short of breath in the past 1 day. [DS]  2119 Reassessed, awaiting CTA chest, has had some delays coordinating with RT and nursing staff to get her over there while on BiPAP.  Discussed signs of pneumonia and infection on x-ray, plan for medical admission.  We discussed elevated troponins and possible etiologies of this.  They confirmed that she has no history of A-fib prior to this tonight that they know of. [DS]  2217 Call from rads [DS]  2235 Updated patient and brother [DS]  2248 I consult ICU for admission [DS]    Clinical Course User Index [DS] Claudene Rover, MD      FINAL CLINICAL IMPRESSION(S) / ED DIAGNOSES   Final diagnoses:  Acute respiratory failure with hypoxia (HCC)  Acute pulmonary embolism without acute cor pulmonale, unspecified pulmonary embolism type (HCC)  Severe sepsis (HCC)     Rx / DC Orders   ED Discharge Orders     None        Note:  This document was prepared using Dragon voice recognition software and may include unintentional dictation errors.   Claudene Rover, MD 03/18/24 [phone]

## 2024-03-18 NOTE — Progress Notes (Signed)
 ANTICOAGULATION CONSULT NOTE  Pharmacy Consult for heparin  infusion Indication: NSTEMI and new onset Afib RVR  Allergies  Allergen Reactions   Ezetimibe Other (See Comments)    Body aches, and stiffness   Codeine Diarrhea   Cortisone Swelling    injections   Lovastatin Other (See Comments)    Muscle cramps    Patient Measurements: Height: 5' (152.4 cm) Weight: 74.7 kg (164 lb 10.9 oz) IBW/kg (Calculated) : 45.5 HEPARIN  DW (KG): 62.2  Vital Signs: Temp: 99.4 F (37.4 C) (11/14 1824) Temp Source: Axillary (11/14 1824) BP: 91/63 (11/14 2100) Pulse Rate: 127 (11/14 2100)  Labs: Recent Labs    03/16/24 0830 03/18/24 1850  HGB 10.8* 9.9*  HCT 32.7* 30.7*  PLT 411* 465*  LABPROT  --  14.6  INR  --  1.1  CREATININE 0.69 1.05*    Estimated Creatinine Clearance: 39.2 mL/min (A) (by C-G formula based on SCr of 1.05 mg/dL (H)).   Medical History: Past Medical History:  Diagnosis Date   Acid reflux    Anemia    Anesthesia complication    Tachycardia previously, now on metoprolol    Arthritis    09/02/2019: per patient have it real bad in both hands and back   Difficult intubation    had to terminate intubation unable to advance tube for cholecystectomy 2017?   DVT (deep venous thrombosis) (HCC)    leg   Hypertension    Sciatica    09/02/2019: has had it for about 3 years    Assessment: Pt is a 78 yo female presenting to ED c/o SOB and fall, found with new onset Afib , found with elevated Troponin level trending up and BNP.  Goal of Therapy:  Heparin  level 0.3-0.7 units/ml Monitor platelets by anticoagulation protocol: Yes   Plan:  Bolus 3700 units x 1 Start heparin  infusion at 950 units/hr Will check HL in 8 hr after start of infusion CBC daily while on heparin   Rankin CANDIE Dills, PharmD, [facility] 03/18/2024 10:05 PM

## 2024-03-18 NOTE — ED Triage Notes (Signed)
 Pt BIB [facility] from home. Family initially called out due to pt slipping out of chair. EMS reports a room air sat of 62% and was placed on cpap. Pt is not c/o sob but endorses feeling off for the past day. Does not wear o2 at baseline.

## 2024-03-19 ENCOUNTER — Other Ambulatory Visit: Payer: Self-pay

## 2024-03-19 ENCOUNTER — Inpatient Hospital Stay

## 2024-03-19 ENCOUNTER — Inpatient Hospital Stay (HOSPITAL_COMMUNITY)
Admit: 2024-03-19 | Discharge: 2024-03-19 | Disposition: A | Discharge status: Home / Self Care | Attending: Student in an Organized Health Care Education/Training Program | Admitting: Student in an Organized Health Care Education/Training Program

## 2024-03-19 DIAGNOSIS — I2699 Other pulmonary embolism without acute cor pulmonale: Secondary | ICD-10-CM

## 2024-03-19 DIAGNOSIS — I519 Heart disease, unspecified: Secondary | ICD-10-CM

## 2024-03-19 DIAGNOSIS — R570 Cardiogenic shock: Secondary | ICD-10-CM

## 2024-03-19 DIAGNOSIS — J9601 Acute respiratory failure with hypoxia: Principal | ICD-10-CM

## 2024-03-19 DIAGNOSIS — I371 Nonrheumatic pulmonary valve insufficiency: Secondary | ICD-10-CM

## 2024-03-19 DIAGNOSIS — R0609 Other forms of dyspnea: Secondary | ICD-10-CM | POA: Diagnosis not present

## 2024-03-19 DIAGNOSIS — N179 Acute kidney failure, unspecified: Secondary | ICD-10-CM | POA: Diagnosis not present

## 2024-03-19 DIAGNOSIS — A419 Sepsis, unspecified organism: Secondary | ICD-10-CM

## 2024-03-19 LAB — COOXEMETRY PANEL
Carboxyhemoglobin: 1.1 % (ref 0.5–1.5)
Methemoglobin: <0.7 % (ref 0.0–1.5)
O2 Saturation: 56.2 %
Total hemoglobin: 15.3 g/dL (ref 12.0–16.0)
Total oxygen content: 55.2 %

## 2024-03-19 LAB — CBC
HCT: 27.1 % — ABNORMAL LOW (ref 36.0–46.0)
Hemoglobin: 8.8 g/dL — ABNORMAL LOW (ref 12.0–15.0)
MCH: 24.6 pg — ABNORMAL LOW (ref 26.0–34.0)
MCHC: 32.5 g/dL (ref 30.0–36.0)
MCV: 75.7 fL — ABNORMAL LOW (ref 80.0–100.0)
Platelets: 410 K/uL — ABNORMAL HIGH (ref 150–400)
RBC: 3.58 MIL/uL — ABNORMAL LOW (ref 3.87–5.11)
RDW: 21.8 % — ABNORMAL HIGH (ref 11.5–15.5)
WBC: 31.2 K/uL — ABNORMAL HIGH (ref 4.0–10.5)
nRBC: 0 % (ref 0.0–0.2)

## 2024-03-19 LAB — BASIC METABOLIC PANEL WITH GFR
Anion gap: 13 (ref 5–15)
BUN: 37 mg/dL — ABNORMAL HIGH (ref 8–23)
CO2: 20 mmol/L — ABNORMAL LOW (ref 22–32)
Calcium: 7.7 mg/dL — ABNORMAL LOW (ref 8.9–10.3)
Chloride: 87 mmol/L — ABNORMAL LOW (ref 98–111)
Creatinine, Ser: 1.14 mg/dL — ABNORMAL HIGH (ref 0.44–1.00)
GFR, Estimated: 49 mL/min — ABNORMAL LOW (ref >60)
Glucose, Bld: 165 mg/dL — ABNORMAL HIGH (ref 70–99)
Potassium: 4.6 mmol/L (ref 3.5–5.1)
Sodium: 120 mmol/L — ABNORMAL LOW (ref 135–145)

## 2024-03-19 LAB — ECHOCARDIOGRAM COMPLETE
AR max vel: 1.31 cm2
AV Peak grad: 6.9 mmHg
Ao pk vel: 1.31 m/s
Area-P 1/2: 2.24 cm2
Height: 60 in
S' Lateral: 2.1 cm
Weight: 2758.4 [oz_av]

## 2024-03-19 LAB — RESP PANEL BY RT-PCR (RSV, FLU A&B, COVID)  RVPGX2
Influenza A by PCR: NEGATIVE
Influenza B by PCR: NEGATIVE
Resp Syncytial Virus by PCR: NEGATIVE
SARS Coronavirus 2 by RT PCR: NEGATIVE

## 2024-03-19 LAB — GLUCOSE, CAPILLARY: Glucose-Capillary: 173 mg/dL — ABNORMAL HIGH (ref 70–99)

## 2024-03-19 LAB — PHOSPHORUS: Phosphorus: 4.5 mg/dL (ref 2.5–4.6)

## 2024-03-19 LAB — HEPARIN LEVEL (UNFRACTIONATED)
Heparin Unfractionated: 0.3 [IU]/mL (ref 0.30–0.70)
Heparin Unfractionated: 0.25 [IU]/mL — ABNORMAL LOW (ref 0.30–0.70)
Heparin Unfractionated: 0.26 [IU]/mL — ABNORMAL LOW (ref 0.30–0.70)

## 2024-03-19 LAB — MAGNESIUM: Magnesium: 2 mg/dL (ref 1.7–2.4)

## 2024-03-19 LAB — URINALYSIS, ROUTINE W REFLEX MICROSCOPIC
Bilirubin Urine: NEGATIVE
Glucose, UA: NEGATIVE mg/dL
Hgb urine dipstick: NEGATIVE
Ketones, ur: NEGATIVE mg/dL
Leukocytes,Ua: NEGATIVE
Nitrite: NEGATIVE
Protein, ur: NEGATIVE mg/dL
Specific Gravity, Urine: 1.029 (ref 1.005–1.030)
pH: 5 (ref 5.0–8.0)

## 2024-03-19 LAB — MRSA NEXT GEN BY PCR, NASAL: MRSA by PCR Next Gen: NOT DETECTED

## 2024-03-19 LAB — LACTIC ACID, PLASMA
Lactic Acid, Venous: 1.8 mmol/L (ref 0.5–1.9)
Lactic Acid, Venous: 1.6 mmol/L (ref 0.5–1.9)

## 2024-03-19 MED ORDER — CEFTRIAXONE SODIUM 2 G IJ SOLR
2.0000 g | INTRAMUSCULAR | Status: AC
  Administered 2024-03-19 – 2024-03-21 (×3): 2 g via INTRAVENOUS
  Filled 2024-03-19 (×4): qty 20

## 2024-03-19 MED ORDER — HEPARIN BOLUS VIA INFUSION
950.0000 [IU] | Freq: Once | INTRAVENOUS | Status: AC
  Administered 2024-03-19: 950 [IU] via INTRAVENOUS
  Filled 2024-03-19: qty 950

## 2024-03-19 MED ORDER — SODIUM CHLORIDE 0.9 % IV SOLN
500.0000 mg | INTRAVENOUS | Status: DC
  Administered 2024-03-19 – 2024-03-21 (×3): 500 mg via INTRAVENOUS
  Filled 2024-03-19 (×4): qty 5

## 2024-03-19 MED ORDER — OXYCODONE HCL 5 MG PO TABS
5.0000 mg | ORAL_TABLET | ORAL | Status: DC | PRN
  Administered 2024-03-19: 5 mg via ORAL
  Administered 2024-03-20 – 2024-03-21 (×2): 10 mg via ORAL
  Administered 2024-03-25: 5 mg via ORAL
  Filled 2024-03-19 (×2): qty 2
  Filled 2024-03-19 (×2): qty 1

## 2024-03-19 MED ORDER — FENTANYL CITRATE (PF) 50 MCG/ML IJ SOSY
50.0000 ug | PREFILLED_SYRINGE | Freq: Once | INTRAMUSCULAR | Status: AC
  Administered 2024-03-19: 50 ug via INTRAVENOUS
  Filled 2024-03-19: qty 1

## 2024-03-19 MED ORDER — METOPROLOL TARTRATE 5 MG/5ML IV SOLN
5.0000 mg | Freq: Once | INTRAVENOUS | Status: AC
  Administered 2024-03-19: 5 mg via INTRAVENOUS
  Filled 2024-03-19: qty 5

## 2024-03-19 MED ORDER — SODIUM CHLORIDE 0.9 % IV SOLN
2.0000 g | Freq: Two times a day (BID) | INTRAVENOUS | Status: DC
  Administered 2024-03-19: 2 g via INTRAVENOUS
  Filled 2024-03-19 (×2): qty 12.5

## 2024-03-19 MED ORDER — VANCOMYCIN HCL 1750 MG/350ML IV SOLN
1750.0000 mg | Freq: Once | INTRAVENOUS | Status: AC
  Administered 2024-03-19: 1750 mg via INTRAVENOUS
  Filled 2024-03-19: qty 350

## 2024-03-19 MED ORDER — VANCOMYCIN HCL 750 MG/150ML IV SOLN: 750.0000 mg | INTRAVENOUS | Status: DC

## 2024-03-19 NOTE — Progress Notes (Signed)
 ANTICOAGULATION CONSULT NOTE  Pharmacy Consult for heparin  infusion Indication: NSTEMI and new onset Afib RVR with PE  Allergies  Allergen Reactions   Ezetimibe Other (See Comments)    Body aches, and stiffness   Codeine Diarrhea   Cortisone Swelling    injections   Lovastatin Other (See Comments)    Muscle cramps    Patient Measurements: Height: 5' (152.4 cm) Weight: 78.2 kg (172 lb 6.4 oz) IBW/kg (Calculated) : 45.5 HEPARIN  DW (KG): 62.2  Vital Signs: BP: 101/69 (11/15 1000) Pulse Rate: 83 (11/15 1000)  Labs: Recent Labs    03/18/24 1850 03/18/24 2250 03/19/24 0326 03/19/24 1123  HGB 9.9*  --  8.8*  --   HCT 30.7*  --  27.1*  --   PLT 465*  --  410*  --   APTT  --  26  --   --   LABPROT 14.6  --   --   --   INR 1.1  --   --   --   HEPARINUNFRC  --   --  0.30 0.25*  CREATININE 1.05*  --  1.14*  --     Estimated Creatinine Clearance: 37 mL/min (A) (by C-G formula based on SCr of 1.14 mg/dL (H)).   Medical History: Past Medical History:  Diagnosis Date   Acid reflux    Anemia    Anesthesia complication    Tachycardia previously, now on metoprolol    Arthritis    09/02/2019: per patient have it real bad in both hands and back   Difficult intubation    had to terminate intubation unable to advance tube for cholecystectomy 2017?   DVT (deep venous thrombosis) (HCC)    leg   Hypertension    Sciatica    09/02/2019: has had it for about 3 years    Assessment: Pt is a 78 yo female presenting to ED c/o SOB and fall, found with new onset Afib , found with elevated Troponin level trending up and BNP.  Goal of Therapy:  Heparin  level 0.3-0.7 units/ml Monitor platelets by anticoagulation protocol: Yes  11/15 0326 HL 0.30, therapeutic x 1 11/15 1123 HL 0.25, subtherapeutic@950  units/hr   Plan:  Bolus 950 units x 1 Increase heparin  infusion at 1100 units/hr Recheck HL in 8 hr to confirm CBC daily while on heparin   Daven Pinckney A Marsha Hillman, PharmD Clinical  Pharmacist 03/19/2024 12:29 PM

## 2024-03-19 NOTE — Consult Note (Signed)
 PHARMACY CONSULT NOTE - ELECTROLYTES  Pharmacy Consult for Electrolyte Monitoring and Replacement   Recent Labs: Height: 5' (152.4 cm) Weight: 78.2 kg (172 lb 6.4 oz) IBW/kg (Calculated) : 45.5 Estimated Creatinine Clearance: 37 mL/min (A) (by C-G formula based on SCr of 1.14 mg/dL (H)). Potassium (mmol/L)  Date Value  03/19/2024 4.6   Magnesium (mg/dL)  Date Value  88/84/7974 2.0   Calcium (mg/dL)  Date Value  88/84/7974 7.7 (L)   Albumin  (g/dL)  Date Value  88/85/7974 3.0 (L)   Phosphorus (mg/dL)  Date Value  88/84/7974 4.5   Sodium (mmol/L)  Date Value  03/19/2024 120 (L)   Corrected Ca: 8.5 mg/dL  Assessment  Hannah Yu is a 78 y.o. female presenting with shortness of breath. 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 Pharmacy has been consulted to monitor and replace electrolytes.  Diet: NPO MIVF: N/A Pertinent medications: N/A  Goal of Therapy: Electrolytes WNL  Plan:  No replacement currently indicated Check BMP, Mg, Phos with AM labs  Thank you for allowing pharmacy to be a part of this patient's care.  Layla Kesling A Monserrate Blaschke, PharmD Clinical Pharmacist 03/19/2024 8:41 AM

## 2024-03-19 NOTE — H&P (Signed)
 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  He reports she is been nonambulatory due to generalized weakness and chemo related peripheral neuropathy.  Patient usually sits for long periods of time in the recliner and had a fall last week.  On EMS arrival patient was hypoxic with sats of 62% on room air.  She was placed on CPAP and transported to the ED.  ED Course: Initial vital signs showed HR of 130 beats/minute, BP 128/174mm Hg, the RR 21 breaths/minute,95 %O2 Sat on CPAP and Temp of 99.45F (36.4C).  Pertinent Labs/Diagnostics Findings: Na+/ K+: 117/5.0 Glucose:195 BUN/Cr.:35/1.05 AST/ALT54/30: CO2:21 AG:17 WBC: 27.3K/L Hgb/Hct:9.9/30.7 Plts:465  PCT: 11.5  Lactic acid: 4.6~2.7 COVID PCR: Negative,  HsTrop:349~365 [id number]   CXR>? Pneumonia, left pleural effusion CTA Chest> Positive for right segmental PE  Patient was started on Heparin  gtt and abx initated for CAP coverage. 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 B12 deficiency  Chronic acquired lymphedema  Diverticulitis  GERD (gastroesophageal reflux disease)  History of cataract  History of mammogram 03/01/2013  Hyperlipidemia  Hypertension  Osteoarthritis  SVT DVT  Significant  Hospital Events   11/14: Admitted to ICU with acute hypoxic respiratory failure iso PE and possible pneumonia  Consults:  Vascular  Procedures:  None  Interim History / Subjective:      Micro Data:  11/14: SARS-CoV-2 PCR> negative 11/14: Influenza PCR> negative 11/14: Respiratory Viral Panel> 11/14: Blood culture x2> 11/14: MRSA PCR>>   Antimicrobials:  Azithromycin 11/14> Ceftriaxone 11/14>  OBJECTIVE  Blood pressure (!) 91/59, pulse (!) 106, temperature 99.5 F (37.5 C), temperature source Axillary, resp. rate 18, height 5' (1.524 m), weight 74.7 kg, SpO2 100%.    FiO2 (%):  [30 %-100 %] 80 %   Intake/Output Summary (Last 24 hours) at 03/19/2024 0303 Last data filed at 03/18/2024 2202 Gross per 24 hour  Intake 100 ml  Output --  Net 100 ml   Filed Weights   03/18/24 1828  Weight: 74.7 kg   Physical Examination  GEN: Critically ill patient, WDWN in NAD, on BiPAP HEENT: Lead/AT. PERRL, sclerae anicteric. HEART: regular rhythm, normal rate, S1, S2, no M/R/G,  LUNGS: CTAB, mild crackles without wheezes, no increased WOB,  EXTREMITIES: Bilateral Lower extremities edema NEURO: No gross focal deficits. PSYCH:  Mood and Affect: Mood normal.  ABDOMINAL: Soft: BS x 4, NTND SKIN: Intact, warm, no rashes lesion, or ulcer  Labs/imaging that I havepersonally reviewed  (right click and Reselect all SmartList Selections daily)   CT Angio Chest PE W and/or Wo Contrast Result Date: 03/18/2024 EXAM: CTA CHEST 03/18/2024 09:56:31 PM TECHNIQUE: CTA of the chest was performed after the administration of 75 mL of iohexol  (OMNIPAQUE ) 350 MG/ML injection. Multiplanar reformatted images are provided for review. MIP images are provided for review. Automated exposure control, iterative reconstruction,  and/or weight based adjustment of the mA/kV was utilized to reduce the radiation dose to as low as reasonably achievable. COMPARISON: PET CT dated 12/21/2023 CLINICAL HISTORY: eval PE.  lung cancer patient, acutely hypoxic, tachy. FINDINGS: PULMONARY ARTERIES: Pulmonary arteries are adequately opacified for evaluation. Segmental/subsegmental emboli in the right middle and lower lobe pulmonary arteries (images 57, 65, 66, and 70). Overall clot burden is small to moderate. Main pulmonary artery is normal in caliber. MEDIASTINUM: Right chest port terminates at the cavoatrial junction. Cardiomegaly. The pericardium demonstrates no acute abnormality. Mild thoracic aortic atherosclerosis. LYMPH NODES: No mediastinal, hilar or axillary lymphadenopathy. LUNGS AND PLEURA: Multifocal patchy/nodular opacities in the left lung reflecting a combination of atelectasis and tumor, including a 2.0 cm perifissural nodule along the left fissure (image 34) and 4.5 cm pleural-based mass in the posterior left lower hemithorax (image 87). Overall grossly unchanged. Trace left pleural effusion. No pneumothorax. UPPER ABDOMEN: Cholelithiasis. Limited images of the upper abdomen demonstrate cholelithiasis. SOFT TISSUES AND BONES: Severe compression fracture deformity at T6 (sagittal image 81), new. Known multifocal osseous metastases are not evident on CT, although hypermetabolic on PET. No acute soft tissue abnormality. IMPRESSION: 1. Acute segmental/subsegmental pulmonary emboli in the right middle and lower lobe arteries. Small to moderate clot burden. 2. New severe T6 compression fracture deformity. 3. Left lung multifocal opacities reflecting atelectasis and tumor, overall grossly unchanged. 4. Known osseous metastases are better evaluated on PET. 5. Critical Value/emergent results were called by telephone at the time of interpretation on 03/18/2024 at 2215 hrs to provider Dr Claudene. Electronically signed by: Pinkie Pebbles MD 03/18/2024 10:19 PM EST RP Workstation: [id number]   DG Chest Port 1 View Result Date: 03/18/2024 EXAM: 1 VIEW(S) XRAY OF THE CHEST 03/18/2024 07:05:00 PM COMPARISON: 07/22/2023 CLINICAL  HISTORY: Questionable sepsis - evaluate for abnormality FINDINGS: LINES, TUBES AND DEVICES: Right chest wall port stable in position. LUNGS AND PLEURA: Small left pleural effusion. Retrocardiac airspace opacity. Developing left upper lobe airspace opacity. No pneumothorax. HEART AND MEDIASTINUM: Cardiomegaly, unchanged. Aortic arch atherosclerosis. BONES AND SOFT TISSUES: Rotator cuff anchor suture along the right shoulder noted. No acute osseous abnormality. IMPRESSION: 1. Developing left upper lobe airspace opacity and retrocardiac airspace opacity, suspicious for pneumonia or aspiration. Follow-up PA and lateral chest x-ray is recommended in 3-4 weeks following therapy to ensure resolution and exclude underlying malignancy. 2. Small left pleural effusion with associated retroperitoneal airspace opacity . Electronically signed by: Kate Plummer MD 03/18/2024 07:27 PM EST RP Workstation: [id number]     Labs   CBC: Recent Labs  Lab 03/16/24 0830 03/18/24 1850  WBC 17.7* 27.3*  NEUTROABS 14.5* 24.0*  HGB 10.8* 9.9*  HCT 32.7* 30.7*  MCV 74.1* 76.2*  PLT 411* 465*    Basic Metabolic Panel: Recent Labs  Lab 03/16/24 0830 03/18/24 1850  NA 118* 117*  K 3.9 5.0  CL 81* 80*  CO2 24 21*  GLUCOSE 190* 195*  BUN 20 35*  CREATININE 0.69 1.05*  CALCIUM 8.3* 8.6*  MG  --  2.1   GFR: Estimated Creatinine Clearance: 39.2 mL/min (A) (by C-G formula based on SCr of 1.05 mg/dL (H)). Recent Labs  Lab 03/16/24 0830 03/18/24 1826 03/18/24 1850 03/18/24 2033  PROCALCITON  --   --   --  11.50  WBC 17.7*  --  27.3*  --   LATICACIDVEN  --  4.6*  --  2.7*   Liver Function Tests: Recent Labs  Lab 03/16/24 0830 03/18/24 1850  AST 24 54*  ALT 19 30  ALKPHOS 173* 228*  BILITOT 0.4 0.3  PROT 6.8 6.7  ALBUMIN  2.5* 3.0*   No results for input(s): LIPASE, AMYLASE in the last 168 hours. No results for input(s): AMMONIA in the last 168 hours.  ABG No results found for: PHART,  PCO2ART, PO2ART, HCO3, TCO2, ACIDBASEDEF, O2SAT   Coagulation Profile: Recent Labs  Lab 03/18/24 1850  INR 1.1   Cardiac Enzymes: No results for input(s): CKTOTAL, CKMB, CKMBINDEX, TROPONINI in the last 168 hours.  HbA1C: No results found for: HGBA1C  CBG: Recent Labs  Lab 03/19/24 0015  GLUCAP 173*   Review of Systems:   Unable to be obtained secondary to the patient's on BiPAP  Past Medical History  She,  has a past medical history of Acid reflux, Anemia, Anesthesia complication, Arthritis, Difficult intubation, DVT (deep venous thrombosis) (HCC), Hypertension, and Sciatica.   Surgical History    Past Surgical History:  Procedure Laterality Date   ABDOMINAL HYSTERECTOMY     CATARACT EXTRACTION W/ INTRAOCULAR LENS IMPLANT Bilateral 2007   eyes done within 1 month of each other.   IR IMAGING GUIDED PORT INSERTION  05/19/2023   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   TOTAL HIP ARTHROPLASTY Right 09/06/2019   Procedure: RIGHT TOTAL HIP ARTHROPLASTY ANTERIOR APPROACH;  Surgeon: Vernetta Lonni GRADE, MD;  Location: [facility] OR;  Service: Orthopedics;  Laterality: Right;   TOTAL HIP ARTHROPLASTY Left 07/31/2020   Procedure: LEFT TOTAL HIP ARTHROPLASTY ANTERIOR APPROACH;  Surgeon: Vernetta Lonni GRADE, MD;  Location: [facility] OR;  Service: Orthopedics;  Laterality: Left;   VIDEO BRONCHOSCOPY WITH ENDOBRONCHIAL ULTRASOUND N/A 10/06/2022   Procedure: VIDEO BRONCHOSCOPY WITH ENDOBRONCHIAL ULTRASOUND;  Surgeon: Parris Manna, MD;  Location: [facility] ORS;  Service: Thoracic;  Laterality: N/A;   Social History   reports that she quit smoking about 31 years ago. Her smoking use included cigarettes. She has never used smokeless tobacco. She reports current alcohol use of about 4.0 standard drinks of alcohol per week. She reports that she does not use drugs.   Family History   Her family history includes Breast  cancer (age of onset: 38) in her sister.   Allergies Allergies  Allergen Reactions   Ezetimibe Other (See Comments)    Body aches, and stiffness   Codeine Diarrhea   Cortisone Swelling    injections   Lovastatin Other (See Comments)    Muscle cramps   Home Medications  Prior to Admission medications   Medication Sig Start Date End Date Taking? Authorizing Provider  aspirin  EC 81 MG tablet Take 162 mg by mouth daily. Swallow whole.    [provider]  Biotin 10 MG CAPS  08/07/22   [provider]  cholecalciferol  (VITAMIN D3) 25 MCG (1000 UNIT) tablet Take 1,000 Units by mouth daily.    [provider]  diphenoxylate -atropine  (LOMOTIL ) 2.5-0.025 MG tablet Take 1 tablet by mouth 4 (four) times daily as needed for diarrhea or loose stools. 11/09/23   Jacobo Evalene PARAS, MD  DULoxetine (CYMBALTA) 30 MG capsule Take 1 capsule (30 mg total) by mouth daily. 03/07/24   Borders, Fonda SAUNDERS, NP  FIBER PO Take 1 tablet by mouth daily.    [provider]  furosemide  (LASIX ) 40 MG tablet Take 40 mg by mouth daily. 40 mg alternating 80 mg every other day    [provider]  ibuprofen (ADVIL) 200 MG tablet Take 400 mg by mouth  every 6 (six) hours as needed for mild pain or moderate pain.    [provider]  Ibuprofen-diphenhydrAMINE  HCl (IBUPROFEN PM) 200-25 MG CAPS Take 2 tablets by mouth at bedtime as needed (sleep).    [provider]  LORazepam (ATIVAN) 0.5 MG tablet Take by mouth. 09/04/23   [provider]  losartan  (COZAAR ) 100 MG tablet Take 100 mg by mouth daily. 07/15/19   [provider]  magic mouthwash (multi-ingredient) oral suspension Swish and swallow 5-10 mLs 4 (four) times daily as needed. 02/26/24   Jacobo Evalene PARAS, MD  magic mouthwash w/lidocaine  SOLN Take 5 mLs by mouth 4 (four) times daily. Suspension contains equal amounts of Maalox Extra Strength, nystatin, diphenhydramine  and lidocaine . 03/07/24    Borders, Joshua R, NP  magnesium oxide (MAG-OX) 400 (240 Mg) MG tablet Take 400 mg by mouth daily.    [provider]  methylPREDNISolone  (MEDROL  DOSEPAK) 4 MG TBPK tablet Start with 6 tablets for 1st day and then reduce by one tablet daily until completed. 02/11/24   Borders, Fonda SAUNDERS, NP  metoprolol  succinate (TOPROL -XL) 50 MG 24 hr tablet Take 50 mg by mouth daily. 06/24/19   [provider]  omeprazole (PRILOSEC) 40 MG capsule Take 40 mg by mouth daily as needed.    [provider]  ondansetron  (ZOFRAN ) 8 MG tablet Take 1 tablet (8 mg total) by mouth every 8 (eight) hours as needed for nausea or vomiting. 01/08/24   Jacobo Evalene PARAS, MD  OVER THE COUNTER MEDICATION Take 1 capsule by mouth daily. CBD gummie    [provider]  oxyCODONE  (OXY IR/ROXICODONE ) 5 MG immediate release tablet Take 1-2 tablets (5-10 mg total) by mouth every 4 (four) hours as needed for severe pain (pain score 7-10). 02/22/24   Jacobo Evalene PARAS, MD  pregabalin (LYRICA) 25 MG capsule Take 1 capsule (25 mg total) by mouth 2 (two) times daily. 03/16/24   Borders, Fonda SAUNDERS, NP  prochlorperazine  (COMPAZINE ) 10 MG tablet TAKE ONE TABLET BY MOUTH EVERY 6 HOURS AS NEEDED FOR NAUSEA / VOMITING 09/24/23   Leonard, Alyson N, RPH-CPP  sodium chloride  1 g tablet Take 1 tablet (1 g total) by mouth 3 (three) times daily with meals. 03/16/24   Finnegan, Timothy J, MD  sucralfate (CARAFATE) 1 g tablet Take 1 tablet (1 g total) by mouth 3 (three) times daily before meals. Dissolve tablet in 4 T warm water swish and swallow 30 minutes before meals. 03/16/24 04/15/24  Borders, Fonda SAUNDERS, NP  Tetrahydrozoline HCl (VISINE OP) Place 1 drop into both eyes daily as needed (dry eyes).    [provider]  vitamin B-12 (CYANOCOBALAMIN ) 1000 MCG tablet Take 1,000 mcg by mouth daily.    [provider]  Scheduled Meds:  Chlorhexidine  Gluconate Cloth  6 each Topical Daily   Continuous Infusions:   heparin  950 Units/hr (03/19/24 0043)   norepinephrine (LEVOPHED) Adult infusion 2 mcg/min (03/19/24 0338)   PRN Meds:.docusate sodium , polyethylene glycol  Active Hospital Problem list   See systems below  Assessment & Plan:  #Acute Segmental Right PE with with no CT evidence of right heart strain Etiology likely due to + Hypercoagulable iso Malignancy, and + Venous Stasis: Immobilization due to severe Peripheral Neuropathy -Hemodynamic support with IVF + vasopressors  -Start Heparin  gtt -Follow Troponin, BNP, serial EKG  -Obtain TTEcho  -Obtain Bilateral US  DVT -Consult IR/Vascular for  Thromobolysis vs. Embolectomy and IVC Filter placement if appropriate   #Acute Hypoxic Respiratory  Failure iso  #Acute PE #Left Pleural Effusion #?Pneumonia PMHx: Progressive Left lung cancer with mets following with Hem/onc  -Supplemental O2 as needed titrate to goal 88-92% -BiPAP, wean as tolerated -High risk for intubation -PRN X-ray & ABG  -PRN Bronchodilators   -Antibiotics as below    #Sepsis Unclear source, wbc significantly elevated with procal 11.5.?reactive -f/u cultures, trend lactic/ PCT -Monitor WBC/ fever curve -IV antibiotics: received coverage in the ED with ceftriaxone and Azithromycin. 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 -Mets to the brain treated 5/25 -repeat MRI Brain pending per her oncologist -plan to transition to chemo,delayed given  significant hyponatremia   #Chemo related Severe Peripheral Neuropathy #Lower back pain/sciatica  -resume home Duloxetine and Lyrica  Best practice:  Diet:  NPO Pain/Anxiety/Delirium protocol (if indicated): No VAP protocol (if indicated): Not indicated DVT prophylaxis: Systemic AC GI prophylaxis: PPI Glucose control:  SSI Yes Central venous access:  N/A Arterial line:  N/A Foley:  N/A Mobility:  bed rest  PT/OT consulted: N/A Code Status:  full code Disposition: ICU   = Goals of Care =  Primary Emergency Contact: Sposato,Joan M   Critical care time: 47 minutes        Almarie Nose DNP, CCRN, FNP-C, AGACNP-BC Acute Care & Family Nurse Practitioner [facility] PCCM on call pager [phone]

## 2024-03-19 NOTE — Progress Notes (Signed)
 ANTICOAGULATION CONSULT NOTE  Pharmacy Consult for heparin  infusion Indication: NSTEMI and new onset Afib RVR with PE  Allergies  Allergen Reactions   Ezetimibe Other (See Comments)    Body aches, and stiffness   Codeine Diarrhea   Cortisone Swelling    injections   Lovastatin Other (See Comments)    Muscle cramps    Patient Measurements: Height: 5' (152.4 cm) Weight: 78.2 kg (172 lb 6.4 oz) IBW/kg (Calculated) : 45.5 HEPARIN  DW (KG): 62.2  Vital Signs: Temp: 98.2 F (36.8 C) (11/15 1945) Temp Source: Oral (11/15 1945) BP: 135/58 (11/15 1945) Pulse Rate: 134 (11/15 2027)  Labs: Recent Labs    03/18/24 1850 03/18/24 2250 03/19/24 0326 03/19/24 1123 03/19/24 2007  HGB 9.9*  --  8.8*  --   --   HCT 30.7*  --  27.1*  --   --   PLT 465*  --  410*  --   --   APTT  --  26  --   --   --   LABPROT 14.6  --   --   --   --   INR 1.1  --   --   --   --   HEPARINUNFRC  --   --  0.30 0.25* 0.26*  CREATININE 1.05*  --  1.14*  --   --     Estimated Creatinine Clearance: 37 mL/min (A) (by C-G formula based on SCr of 1.14 mg/dL (H)).   Medical History: Past Medical History:  Diagnosis Date   Acid reflux    Anemia    Anesthesia complication    Tachycardia previously, now on metoprolol    Arthritis    09/02/2019: per patient have it real bad in both hands and back   Difficult intubation    had to terminate intubation unable to advance tube for cholecystectomy 2017?   DVT (deep venous thrombosis) (HCC)    leg   Hypertension    Sciatica    09/02/2019: has had it for about 3 years    Assessment: Pt is a 78 yo female presenting to ED c/o SOB and fall, found with new onset Afib , found with elevated Troponin level trending up and BNP.  Goal of Therapy:  Heparin  level 0.3-0.7 units/ml Monitor platelets by anticoagulation protocol: Yes  11/15 0326 HL 0.30, therapeutic x 1 11/15 1123 HL 0.25, subtherapeutic@950  units/hr 11/15 2007 HL 0.26, subtherapeutic @ 1100  units/hr   Plan:  Bolus 950 units x 1 Increase heparin  infusion to 1250 units/hr Recheck HL in 8 hr after rate change to confirm CBC daily while on heparin   Hannah Yu, PharmD Clinical Pharmacist 03/19/2024 9:33 PM

## 2024-03-19 NOTE — Progress Notes (Signed)
 Pharmacy Antibiotic Note  Hannah Yu is a 78 y.o. female admitted on 03/18/2024 with sepsis.  Pharmacy has been consulted for Cefepime and Vancomycin dosing.  Plan: Cefepime 2 gm q12hr per indication & renal fxn.   Pt given Vancomycin 1750 mg once. Vancomycin 750 mg IV Q 24 hrs. Goal AUC 400-550. Expected AUC: 493.5 SCr used: 1.14  Follow up culture results to assess for antibiotic optimization. Monitor renal function to assess for any necessary antibiotic dosing changes. Pharmacy will continue to follow and will adjust abx dosing whenever warranted.  Temp (24hrs), Avg:99.5 F (37.5 C), Min:99.4 F (37.4 C), Max:99.5 F (37.5 C)   Recent Labs  Lab 03/16/24 0830 03/18/24 1826 03/18/24 1850 03/18/24 2033 03/19/24 0326  WBC 17.7*  --  27.3*  --  31.2*  CREATININE 0.69  --  1.05*  --  1.14*  LATICACIDVEN  --  4.6*  --  2.7*  --     Estimated Creatinine Clearance: 36.1 mL/min (A) (by C-G formula based on SCr of 1.14 mg/dL (H)).    Allergies  Allergen Reactions   Ezetimibe Other (See Comments)    Body aches, and stiffness   Codeine Diarrhea   Cortisone Swelling    injections   Lovastatin Other (See Comments)    Muscle cramps    Antimicrobials this admission: 11/14 Ceftriaxone >> x 1 dose 11/14 Azithromycin >> x 1 dose 11/15 Cefepime >>  11/15 Vancomycin >>  Microbiology results: 11/14 BCx: Pending  Thank you for allowing pharmacy to be a part of this patient's care.  Rankin CANDIE Dills, PharmD, [facility] 03/19/2024 5:24 AM

## 2024-03-19 NOTE — Consult Note (Signed)
 CARDIOLOGY CONSULT NOTE               Patient ID: Hannah Yu MRN: [id number] DOB/AGE: [birth year] 78 y.o.  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  Denies any chest pain still has some shortness of breath resting comfortably in bed patient has been hypertensive slightly elevated white count and low sodium patient has known brain mets so thrombolytics were contraindicated.  No known history of coronary artery disease.  Echocardiogram showed preserved left ventricular function  Review of systems complete and found to be negative unless listed above     Past Medical History:  Diagnosis Date   Acid reflux    Anemia    Anesthesia complication    Tachycardia previously, now on metoprolol    Arthritis    09/02/2019: per patient have it real bad in both hands and back   Difficult intubation    had to terminate intubation unable to advance tube for cholecystectomy 2017?   DVT (deep venous thrombosis) (HCC)    leg   Hypertension    Sciatica    09/02/2019: has had it for about 3 years    Past Surgical History:  Procedure Laterality Date    ABDOMINAL HYSTERECTOMY     CATARACT EXTRACTION W/ INTRAOCULAR LENS IMPLANT Bilateral 2007   eyes done within 1 month of each other.   IR IMAGING GUIDED PORT INSERTION  05/19/2023   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   TOTAL HIP ARTHROPLASTY Right 09/06/2019   Procedure: RIGHT TOTAL HIP ARTHROPLASTY ANTERIOR APPROACH;  Surgeon: Vernetta Lonni GRADE, MD;  Location: [facility] OR;  Service: Orthopedics;  Laterality: Right;   TOTAL HIP ARTHROPLASTY Left 07/31/2020   Procedure: LEFT TOTAL HIP ARTHROPLASTY ANTERIOR APPROACH;  Surgeon: Vernetta Lonni GRADE, MD;  Location: [facility] OR;  Service: Orthopedics;  Laterality: Left;   VIDEO BRONCHOSCOPY WITH ENDOBRONCHIAL ULTRASOUND N/A 10/06/2022   Procedure: VIDEO BRONCHOSCOPY WITH ENDOBRONCHIAL ULTRASOUND;  Surgeon: Parris Manna, MD;  Location: [facility] ORS;  Service: Thoracic;  Laterality: N/A;    Medications Prior to Admission  Medication Sig Dispense Refill Last Dose/Taking   aspirin  EC 81 MG tablet Take 162 mg by mouth daily. Swallow whole.   03/18/2024   Biotin 10 MG CAPS    03/18/2024   cholecalciferol  (VITAMIN D3) 25 MCG (1000 UNIT) tablet Take 1,000 Units by mouth daily.   03/18/2024   diphenoxylate -atropine  (LOMOTIL ) 2.5-0.025 MG tablet Take 1 tablet by mouth 4 (four) times daily as needed for diarrhea or loose stools. 60 tablet 2 Taking As Needed   DULoxetine (CYMBALTA) 30 MG capsule Take 1 capsule (30 mg total) by mouth daily. 30 capsule 3 03/18/2024   FIBER PO Take 1 tablet by mouth daily.   03/18/2024   furosemide  (LASIX ) 40 MG tablet Take 40 mg  by mouth daily. 40 mg alternating 80 mg every other day   03/18/2024   ibuprofen (ADVIL) 200 MG tablet Take 400 mg by mouth every 6 (six) hours as needed for mild pain or moderate pain.   Taking As Needed   Ibuprofen-diphenhydrAMINE  HCl (IBUPROFEN PM) 200-25 MG CAPS Take 2 tablets by mouth at bedtime as needed (sleep).   Taking As Needed    LORazepam (ATIVAN) 0.5 MG tablet Take by mouth.   03/18/2024   losartan  (COZAAR ) 100 MG tablet Take 100 mg by mouth daily.   03/18/2024   magic mouthwash (multi-ingredient) oral suspension Swish and swallow 5-10 mLs 4 (four) times daily as needed. 480 mL 3 Taking As Needed   magic mouthwash w/lidocaine  SOLN Take 5 mLs by mouth 4 (four) times daily. Suspension contains equal amounts of Maalox Extra Strength, nystatin, diphenhydramine  and lidocaine . 480 mL 0 03/18/2024 Morning   magnesium oxide (MAG-OX) 400 (240 Mg) MG tablet Take 400 mg by mouth daily.   03/18/2024   methylPREDNISolone  (MEDROL  DOSEPAK) 4 MG TBPK tablet Start with 6 tablets for 1st day and then reduce by one tablet daily until completed. 21 each 0 Taking   metoprolol  succinate (TOPROL -XL) 50 MG 24 hr tablet Take 50 mg by mouth daily.   03/18/2024   omeprazole (PRILOSEC) 40 MG capsule Take 40 mg by mouth daily as needed.   03/18/2024   ondansetron  (ZOFRAN ) 8 MG tablet Take 1 tablet (8 mg total) by mouth every 8 (eight) hours as needed for nausea or vomiting. 30 tablet 3 Taking As Needed   oxyCODONE  (OXY IR/ROXICODONE ) 5 MG immediate release tablet Take 1-2 tablets (5-10 mg total) by mouth every 4 (four) hours as needed for severe pain (pain score 7-10). 60 tablet 0 Taking As Needed   pregabalin (LYRICA) 25 MG capsule Take 1 capsule (25 mg total) by mouth 2 (two) times daily. 60 capsule 0 03/18/2024 Morning   prochlorperazine  (COMPAZINE ) 10 MG tablet TAKE ONE TABLET BY MOUTH EVERY 6 HOURS AS NEEDED FOR NAUSEA / VOMITING 30 tablet 2 Taking   sodium chloride  1 g tablet Take 1 tablet (1 g total) by mouth 3 (three) times daily with meals. 60 tablet 0 03/18/2024 Morning   sucralfate (CARAFATE) 1 g tablet Take 1 tablet (1 g total) by mouth 3 (three) times daily before meals. Dissolve tablet in 4 T warm water swish and swallow 30 minutes before meals. 90 tablet 0 03/18/2024 Morning   Tetrahydrozoline HCl (VISINE OP) Place 1 drop into both eyes  daily as needed (dry eyes).   Taking As Needed   vitamin B-12 (CYANOCOBALAMIN ) 1000 MCG tablet Take 1,000 mcg by mouth daily.   03/18/2024   OVER THE COUNTER MEDICATION Take 1 capsule by mouth daily. CBD gummie   Unknown   Social History   Socioeconomic History   Marital status: Single    Spouse name: Not on file   Number of children: Not on file   Years of education: Not on file   Highest education level: Not on file  Occupational History   Not on file  Tobacco Use   Smoking status: Former    Current packs/day: 0.00    Types: Cigarettes    Quit date: 01/02/1993    Years since quitting: 31.2   Smokeless tobacco: Never  Vaping Use   Vaping status: Never Used  Substance and Sexual Activity   Alcohol use: Yes    Alcohol/week: 4.0 standard drinks of alcohol    Types: 4  Glasses of wine per week    Comment: 09/02/2019: per patient ever so often   Drug use: No   Sexual activity: Not on file  Other Topics Concern   Not on file  Social History Narrative   Not on file   Social Drivers of Health   Financial Resource Strain: Low Risk  (12/08/2023)   Received from [facility]   Overall Financial Resource Strain (CARDIA)    Difficulty of Paying Living Expenses: Not hard at all  Food Insecurity: No Food Insecurity (12/08/2023)   Received from [facility]   Hunger Vital Sign    Within the past 12 months, you worried that your food would run out before you got the money to buy more.: Never true    Within the past 12 months, the food you bought just didn't last and you didn't have money to get more.: Never true  Transportation Needs: No Transportation Needs (12/08/2023)   Received from [facility] - Transportation    In the past 12 months, has lack of transportation kept you from medical appointments or from getting medications?: No    Lack of Transportation (Non-Medical): No  Physical Activity: Not on file  Stress: Not on  file  Social Connections: Unknown (09/17/2021)   Received from [facility]   Social Network    Social Network: Not on file  Intimate Partner Violence: Unknown (08/09/2021)   Received from [facility]   HITS    Physically Hurt: Not on file    Insult or Talk Down To: Not on file    Threaten Physical Harm: Not on file    Scream or Curse: Not on file    Family History  Problem Relation Age of Onset   Breast cancer Sister 40      Review of systems complete and found to be negative unless listed above      PHYSICAL EXAM  General: Well developed, well nourished, in no acute distress HEENT:  Normocephalic and atramatic Neck:  No JVD.  Lungs: Clear bilaterally to auscultation and percussion. Heart: HRRR . Normal S1 and S2 without gallops or murmurs.  Abdomen: Bowel sounds are positive, abdomen soft and non-tender  Msk:  Back normal, normal gait. Normal strength and tone for age. Extremities: No clubbing, cyanosis or edema.   Neuro: Alert and oriented X 3. Psych:  Good affect, responds appropriately  Labs:   Lab Results  Component Value Date   WBC 31.2 (H) 03/19/2024   HGB 8.8 (L) 03/19/2024   HCT 27.1 (L) 03/19/2024   MCV 75.7 (L) 03/19/2024   PLT 410 (H) 03/19/2024    Recent Labs  Lab 03/18/24 1850 03/19/24 0326  NA 117* 120*  K 5.0 4.6  CL 80* 87*  CO2 21* 20*  BUN 35* 37*  CREATININE 1.05* 1.14*  CALCIUM 8.6* 7.7*  PROT 6.7  --   BILITOT 0.3  --   ALKPHOS 228*  --   ALT 30  --   AST 54*  --   GLUCOSE 195* 165*   Lab Results  Component Value Date   TROPONINI <0.03 01/10/2016   No results found for: CHOL No results found for: HDL No results found for: LDLCALC No results found for: TRIG No results found for: CHOLHDL No results found for: LDLDIRECT    Radiology: ECHOCARDIOGRAM COMPLETE Result Date: 03/19/2024    ECHOCARDIOGRAM REPORT   Patient Name:   DIANE HANEL Knippenberg Date of  Exam: 03/19/2024 Medical Rec #:  [id number]            Height:       60.0 in Accession #:    [id number]          Weight:       172.4 lb Date of Birth:  [birth year]            BSA:          1.752 m Patient Age:    78 years            BP:           109/78 mmHg Patient Gender: F                   HR:           94 bpm. Exam Location:  [facility] Procedure: 2D Echo, Color Doppler and Cardiac Doppler (Both Spectral and Color            Flow Doppler were utilized during procedure). STAT ECHO Indications:     Dyspnea R06.00  History:         Patient has prior history of Echocardiogram examinations, most                  recent 09/09/2019.  Sonographer:     Thedora Louder RDCS, FASE Referring Phys:  [id number] KHABIB DGAYLI Diagnosing Phys: Annabella Scarce MD IMPRESSIONS  1. Compared with the echo in 2021, right ventricular dilation and dysfunction are new. Consistent with known finding of pulmonary embolis.  2. Left ventricular ejection fraction, by estimation, is 60 to 65%. The left ventricle has normal function. The left ventricle has no regional wall motion abnormalities. Left ventricular diastolic parameters are consistent with Grade I diastolic dysfunction (impaired relaxation).  3. Right ventricular systolic function is moderately reduced. The right ventricular size is moderately enlarged. There is severely elevated pulmonary artery systolic pressure.  4. Right atrial size was severely dilated.  5. The mitral valve is normal in structure. No evidence of mitral valve regurgitation. No evidence of mitral stenosis.  6. 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  7. The aortic valve is tricuspid. Aortic valve regurgitation is not visualized. No aortic stenosis is present.  8. The inferior vena cava is dilated in size with <50% respiratory variability, suggesting right atrial pressure of 15 mmHg. FINDINGS  Left Ventricle: Left ventricular ejection fraction, by estimation, is 60 to 65%. The left ventricle has normal function. The left ventricle has no regional wall motion abnormalities.  The left ventricular internal cavity size was normal in size. There is  no left ventricular hypertrophy. Left ventricular diastolic parameters are consistent with Grade I diastolic dysfunction (impaired relaxation). Normal left ventricular filling pressure. Right Ventricle: The right ventricular size is moderately enlarged. No increase in right ventricular wall thickness. Right ventricular systolic function is moderately reduced. There is severely elevated pulmonary artery systolic pressure. The tricuspid regurgitant velocity is 3.46 m/s, and with an assumed right atrial pressure of 15 mmHg, the estimated right ventricular systolic pressure is 62.9 mmHg. Left Atrium: Left atrial size was normal in size. Right Atrium: Right atrial size was severely dilated. Pericardium: There is no evidence of pericardial effusion. Mitral Valve: The mitral valve is normal in structure. No evidence of mitral valve regurgitation. No evidence of mitral valve stenosis. Tricuspid Valve: The tricuspid valve is normal in structure. 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 No evidence of tricuspid stenosis. Aortic Valve: The aortic valve is tricuspid. Aortic  valve regurgitation is not visualized. No aortic stenosis is present. Aortic valve peak gradient measures 6.9 mmHg. Pulmonic Valve: The pulmonic valve was normal in structure. Pulmonic valve regurgitation is not visualized. No evidence of pulmonic stenosis. Aorta: The aortic root is normal in size and structure. Venous: The inferior vena cava is dilated in size with less than 50% respiratory variability, suggesting right atrial pressure of 15 mmHg. IAS/Shunts: No atrial level shunt detected by color flow Doppler.  LEFT VENTRICLE PLAX 2D LVIDd:         2.90 cm   Diastology LVIDs:         2.10 cm   LV e' medial:    10.00 cm/s LV IVS:        1.00 cm   LV E/e' medial:  6.9 LVOT diam:     1.90 cm   LV e' lateral:   13.60 cm/s LV SV:         30        LV E/e' lateral: 5.1 LV SV Index:   17  LVOT Area:     2.84 cm  RIGHT VENTRICLE RV Basal diam:  3.40 cm RV S prime:     9.46 cm/s TAPSE (M-mode): 1.6 cm LEFT ATRIUM           Index        RIGHT ATRIUM           Index LA Vol (A4C): 40.2 ml 22.94 ml/m  RA Area:     23.80 cm                                    RA Volume:   75.20 ml  42.91 ml/m  AORTIC VALVE                 PULMONIC VALVE AV Area (Vmax): 1.31 cm     PV Vmax:        0.76 m/s AV Vmax:        131.00 cm/s  PV Peak grad:   2.3 mmHg AV Peak Grad:   6.9 mmHg     RVOT Peak grad: 1 mmHg LVOT Vmax:      60.70 cm/s LVOT Vmean:     38.700 cm/s LVOT VTI:       0.105 m  AORTA Ao Root diam: 3.00 cm Ao Asc diam:  3.30 cm MITRAL VALVE               TRICUSPID VALVE MV Area (PHT): 2.24 cm    TR Peak grad:   47.9 mmHg MV Decel Time: 338 msec    TR Vmax:        346.00 cm/s MV E velocity: 68.70 cm/s MV A velocity: 98.00 cm/s  SHUNTS MV E/A ratio:  0.70        Systemic VTI:  0.10 m                            Systemic Diam: 1.90 cm Annabella Scarce MD Electronically signed by Annabella Scarce MD Signature Date/Time: 03/19/2024/11:15:10 AM    Final (Updated)    US  Venous Img Lower Bilateral (DVT) Result Date: 03/19/2024 CLINICAL DATA:  Pulmonary embolism. EXAM: BILATERAL LOWER EXTREMITY VENOUS DOPPLER ULTRASOUND TECHNIQUE: Gray-scale sonography with graded compression, as well as color Doppler and duplex ultrasound were performed to evaluate the lower extremity deep venous systems  from the level of the common femoral vein and including the common femoral, femoral, profunda femoral, popliteal and calf veins including the posterior tibial, peroneal and gastrocnemius veins when visible. The superficial great saphenous vein was also interrogated. Spectral Doppler was utilized to evaluate flow at rest and with distal augmentation maneuvers in the common femoral, femoral and popliteal veins. COMPARISON:  None Available. FINDINGS: RIGHT LOWER EXTREMITY Common Femoral Vein: No evidence of thrombus. Normal  compressibility, respiratory phasicity and response to augmentation. Saphenofemoral Junction: No evidence of thrombus. Normal compressibility and flow on color Doppler imaging. Profunda Femoral Vein: No evidence of thrombus. Normal compressibility and flow on color Doppler imaging. Femoral Vein: No evidence of thrombus. Normal compressibility, respiratory phasicity and response to augmentation. Popliteal Vein: No evidence of thrombus. Normal compressibility, respiratory phasicity and response to augmentation. Calf Veins: Suggestion of nonocclusive thrombus in the visualized right posterior tibial and peroneal veins. Superficial Great Saphenous Vein: No evidence of thrombus. Normal compressibility. Venous Reflux:  None. Other Findings: No evidence of superficial thrombophlebitis or abnormal fluid collection. LEFT LOWER EXTREMITY Common Femoral Vein: No evidence of thrombus. Normal compressibility, respiratory phasicity and response to augmentation. Saphenofemoral Junction: No evidence of thrombus. Normal compressibility and flow on color Doppler imaging. Profunda Femoral Vein: No evidence of thrombus. Normal compressibility and flow on color Doppler imaging. Femoral Vein: No evidence of thrombus. Normal compressibility, respiratory phasicity and response to augmentation. Popliteal Vein: No evidence of thrombus. Normal compressibility, respiratory phasicity and response to augmentation. Calf Veins: No evidence of thrombus. Normal compressibility and flow on color Doppler imaging. Superficial Great Saphenous Vein: No evidence of thrombus. Normal compressibility. Venous Reflux:  None. Other Findings: No evidence of superficial thrombophlebitis or abnormal fluid collection. IMPRESSION: 1. Suggestion of nonocclusive thrombus in the visualized right posterior tibial and peroneal veins. 2. No evidence of deep venous thrombosis in the left lower extremity. Electronically Signed   By: Marcey Moan M.D.   On: 03/19/2024  09:19   CT Angio Chest PE W and/or Wo Contrast Result Date: 03/18/2024 EXAM: CTA CHEST 03/18/2024 09:56:31 PM TECHNIQUE: CTA of the chest was performed after the administration of 75 mL of iohexol  (OMNIPAQUE ) 350 MG/ML injection. Multiplanar reformatted images are provided for review. MIP images are provided for review. Automated exposure control, iterative reconstruction, and/or weight based adjustment of the mA/kV was utilized to reduce the radiation dose to as low as reasonably achievable. COMPARISON: PET CT dated 12/21/2023 CLINICAL HISTORY: eval PE. lung cancer patient, acutely hypoxic, tachy. FINDINGS: PULMONARY ARTERIES: Pulmonary arteries are adequately opacified for evaluation. Segmental/subsegmental emboli in the right middle and lower lobe pulmonary arteries (images 57, 65, 66, and 70). Overall clot burden is small to moderate. Main pulmonary artery is normal in caliber. MEDIASTINUM: Right chest port terminates at the cavoatrial junction. Cardiomegaly. The pericardium demonstrates no acute abnormality. Mild thoracic aortic atherosclerosis. LYMPH NODES: No mediastinal, hilar or axillary lymphadenopathy. LUNGS AND PLEURA: Multifocal patchy/nodular opacities in the left lung reflecting a combination of atelectasis and tumor, including a 2.0 cm perifissural nodule along the left fissure (image 34) and 4.5 cm pleural-based mass in the posterior left lower hemithorax (image 87). Overall grossly unchanged. Trace left pleural effusion. No pneumothorax. UPPER ABDOMEN: Cholelithiasis. Limited images of the upper abdomen demonstrate cholelithiasis. SOFT TISSUES AND BONES: Severe compression fracture deformity at T6 (sagittal image 81), new. Known multifocal osseous metastases are not evident on CT, although hypermetabolic on PET. No acute soft tissue abnormality. IMPRESSION: 1. Acute segmental/subsegmental pulmonary emboli in the right  middle and lower lobe arteries. Small to moderate clot burden. 2. New severe  T6 compression fracture deformity. 3. Left lung multifocal opacities reflecting atelectasis and tumor, overall grossly unchanged. 4. Known osseous metastases are better evaluated on PET. 5. Critical Value/emergent results were called by telephone at the time of interpretation on 03/18/2024 at 2215 hrs to provider Dr Claudene. Electronically signed by: Pinkie Pebbles MD 03/18/2024 10:19 PM EST RP Workstation: [id number]   DG Chest Port 1 View Result Date: 03/18/2024 EXAM: 1 VIEW(S) XRAY OF THE CHEST 03/18/2024 07:05:00 PM COMPARISON: 07/22/2023 CLINICAL HISTORY: Questionable sepsis - evaluate for abnormality FINDINGS: LINES, TUBES AND DEVICES: Right chest wall port stable in position. LUNGS AND PLEURA: Small left pleural effusion. Retrocardiac airspace opacity. Developing left upper lobe airspace opacity. No pneumothorax. HEART AND MEDIASTINUM: Cardiomegaly, unchanged. Aortic arch atherosclerosis. BONES AND SOFT TISSUES: Rotator cuff anchor suture along the right shoulder noted. No acute osseous abnormality. IMPRESSION: 1. Developing left upper lobe airspace opacity and retrocardiac airspace opacity, suspicious for pneumonia or aspiration. Follow-up PA and lateral chest x-ray is recommended in 3-4 weeks following therapy to ensure resolution and exclude underlying malignancy. 2. Small left pleural effusion with associated retroperitoneal airspace opacity . Electronically signed by: Kate Plummer MD 03/18/2024 07:27 PM EST RP Workstation: [id number]    EKG: Sinus tach with PACs rate of 125 nonspecific ST-T wave changes low voltage  ASSESSMENT AND PLAN:  RV dysfunction Acute pulm emboli Acute hypoxic respiratory failure Pleural effusion on the left Possible pneumonia Metastatic lung cancer on the left Sepsis Acute renal insufficiency Acute on chronic hyponatremia Elevated troponins History of hypertension  Plan Continue ICU level care with support Respiratory assistance inhalers supplemental  oxygen as necessary Anticoagulation for pulmonary emboli continue supportive care RV dysfunction appears to be acute on chronic multifactorial etiologies including chemotherapy pulmonary embolus COPD lungs CTA possibly pulmonary hypertension recommend supportive care maintain adequate hydration Metastatic lung cancer therapy as per oncology Correct electrolytes with severe hyponatremia Adequate rate control for sinus tach probably related to underlying comorbid disease Elevated troponin suggest myocardial injury non-MI uncertain whether this coronary disease Large pleural effusion would consider thoracentesis Do not recommend any direct cardiac intervention at this point There is no direct therapy delayed thing with benefit this patient for RV dysfunction at this point except supportive care.  Her RV dysfunction appears to be acute on chronic and multifactorial.  Patient appears to have evidence of pulmonary hypertension which also may be multifactorial  Signed: Cara JONETTA Lovelace MD,  03/19/2024, 12:09 PM

## 2024-03-19 NOTE — Progress Notes (Signed)
 eLink Physician-Brief Progress Note Patient Name: LEXUS SHAMPINE DOB: March 26, 1946 MRN: [id number]   Date of Service  03/19/2024  HPI/Events of Note  Patient admitted with a fall likely precipitated by acute hypoxic respiratory failure, patient is currently on BIPAP.  eICU Interventions  New Patient Evaluation.        Peighton Edgin U Verlon Carcione 03/19/2024, 12:36 AM

## 2024-03-19 NOTE — Progress Notes (Signed)
 ANTICOAGULATION CONSULT NOTE  Pharmacy Consult for heparin  infusion Indication: NSTEMI and new onset Afib RVR  Allergies  Allergen Reactions   Ezetimibe Other (See Comments)    Body aches, and stiffness   Codeine Diarrhea   Cortisone Swelling    injections   Lovastatin Other (See Comments)    Muscle cramps    Patient Measurements: Height: 5' (152.4 cm) Weight: 74.7 kg (164 lb 10.9 oz) IBW/kg (Calculated) : 45.5 HEPARIN  DW (KG): 62.2  Vital Signs: Temp: 99.5 F (37.5 C) (11/14 2340) Temp Source: Axillary (11/14 2340) BP: 104/69 (11/15 0400) Pulse Rate: 86 (11/15 0400)  Labs: Recent Labs    03/16/24 0830 03/18/24 1850 03/18/24 2250 03/19/24 0326  HGB 10.8* 9.9*  --  8.8*  HCT 32.7* 30.7*  --  27.1*  PLT 411* 465*  --  410*  APTT  --   --  26  --   LABPROT  --  14.6  --   --   INR  --  1.1  --   --   HEPARINUNFRC  --   --   --  0.30  CREATININE 0.69 1.05*  --  1.14*    Estimated Creatinine Clearance: 36.1 mL/min (A) (by C-G formula based on SCr of 1.14 mg/dL (H)).   Medical History: Past Medical History:  Diagnosis Date   Acid reflux    Anemia    Anesthesia complication    Tachycardia previously, now on metoprolol    Arthritis    09/02/2019: per patient have it real bad in both hands and back   Difficult intubation    had to terminate intubation unable to advance tube for cholecystectomy 2017?   DVT (deep venous thrombosis) (HCC)    leg   Hypertension    Sciatica    09/02/2019: has had it for about 3 years    Assessment: Pt is a 78 yo female presenting to ED c/o SOB and fall, found with new onset Afib , found with elevated Troponin level trending up and BNP.  Goal of Therapy:  Heparin  level 0.3-0.7 units/ml Monitor platelets by anticoagulation protocol: Yes  11/15 0326 HL 0.30, therapeutic x 1   Plan:  Continue heparin  infusion at 950 units/hr Recheck HL in 8 hr to confirm CBC daily while on heparin   Rankin CANDIE Dills, PharmD,  [facility] 03/19/2024 4:48 AM

## 2024-03-19 NOTE — Progress Notes (Signed)
 Echocardiogram 2D Echocardiogram has been performed. STAT requested and completed.  Hannah Yu 03/19/2024, 10:53 AM

## 2024-03-20 ENCOUNTER — Inpatient Hospital Stay

## 2024-03-20 DIAGNOSIS — R570 Cardiogenic shock: Secondary | ICD-10-CM | POA: Diagnosis not present

## 2024-03-20 DIAGNOSIS — I272 Pulmonary hypertension, unspecified: Secondary | ICD-10-CM | POA: Diagnosis not present

## 2024-03-20 DIAGNOSIS — J9601 Acute respiratory failure with hypoxia: Secondary | ICD-10-CM | POA: Diagnosis not present

## 2024-03-20 DIAGNOSIS — I2699 Other pulmonary embolism without acute cor pulmonale: Secondary | ICD-10-CM | POA: Diagnosis not present

## 2024-03-20 LAB — BLOOD GAS, ARTERIAL
Acid-Base Excess: 1.5 mmol/L (ref 0.0–2.0)
Bicarbonate: 27.2 mmol/L (ref 20.0–28.0)
O2 Content: 10 L/min
O2 Saturation: 99 %
Patient temperature: 37.2
pCO2 arterial: 46 mmHg (ref 32–48)
pH, Arterial: 7.38 (ref 7.35–7.45)
pO2, Arterial: 97 mmHg (ref 83–108)

## 2024-03-20 LAB — HEPARIN LEVEL (UNFRACTIONATED)
Heparin Unfractionated: 0.29 [IU]/mL — ABNORMAL LOW (ref 0.30–0.70)
Heparin Unfractionated: 0.32 [IU]/mL (ref 0.30–0.70)

## 2024-03-20 LAB — TROPONIN T, HIGH SENSITIVITY
Troponin T High Sensitivity: 305 ng/L (ref 0–19)
Troponin T High Sensitivity: 295 ng/L (ref 0–19)

## 2024-03-20 LAB — BASIC METABOLIC PANEL WITH GFR
Anion gap: 15 (ref 5–15)
BUN: 25 mg/dL — ABNORMAL HIGH (ref 8–23)
CO2: 22 mmol/L (ref 22–32)
Calcium: 8.5 mg/dL — ABNORMAL LOW (ref 8.9–10.3)
Chloride: 92 mmol/L — ABNORMAL LOW (ref 98–111)
Creatinine, Ser: 0.71 mg/dL (ref 0.44–1.00)
GFR, Estimated: >60 mL/min (ref >60)
Glucose, Bld: 103 mg/dL — ABNORMAL HIGH (ref 70–99)
Potassium: 4.2 mmol/L (ref 3.5–5.1)
Sodium: 129 mmol/L — ABNORMAL LOW (ref 135–145)

## 2024-03-20 LAB — COOXEMETRY PANEL
Carboxyhemoglobin: 1.7 % — ABNORMAL HIGH (ref 0.5–1.5)
Methemoglobin: 0 % (ref 0.0–1.5)
O2 Saturation: 79 %
Total hemoglobin: 9.3 g/dL — ABNORMAL LOW (ref 12.0–16.0)
Total oxygen content: 77.7 %

## 2024-03-20 LAB — CBC
HCT: 28.3 % — ABNORMAL LOW (ref 36.0–46.0)
Hemoglobin: 9.1 g/dL — ABNORMAL LOW (ref 12.0–15.0)
MCH: 25 pg — ABNORMAL LOW (ref 26.0–34.0)
MCHC: 32.2 g/dL (ref 30.0–36.0)
MCV: 77.7 fL — ABNORMAL LOW (ref 80.0–100.0)
Platelets: 457 K/uL — ABNORMAL HIGH (ref 150–400)
RBC: 3.64 MIL/uL — ABNORMAL LOW (ref 3.87–5.11)
RDW: 22.7 % — ABNORMAL HIGH (ref 11.5–15.5)
WBC: 20.6 K/uL — ABNORMAL HIGH (ref 4.0–10.5)
nRBC: 0.1 % (ref 0.0–0.2)

## 2024-03-20 LAB — STREP PNEUMONIAE URINARY ANTIGEN: Strep Pneumo Urinary Antigen: NEGATIVE

## 2024-03-20 LAB — PHOSPHORUS: Phosphorus: 3.6 mg/dL (ref 2.5–4.6)

## 2024-03-20 LAB — MAGNESIUM: Magnesium: 2.1 mg/dL (ref 1.7–2.4)

## 2024-03-20 LAB — LACTIC ACID, PLASMA: Lactic Acid, Venous: 1.6 mmol/L (ref 0.5–1.9)

## 2024-03-20 LAB — PRO BRAIN NATRIURETIC PEPTIDE: Pro Brain Natriuretic Peptide: 12988 pg/mL — ABNORMAL HIGH (ref <300.0)

## 2024-03-20 MED ORDER — MAGIC MOUTHWASH W/LIDOCAINE
5.0000 mL | Freq: Four times a day (QID) | ORAL | Status: DC
  Administered 2024-03-20 – 2024-04-03 (×48): 5 mL via ORAL
  Filled 2024-03-20 (×61): qty 5

## 2024-03-20 MED ORDER — DULOXETINE HCL 30 MG PO CPEP
30.0000 mg | ORAL_CAPSULE | Freq: Every day | ORAL | Status: DC
  Administered 2024-03-20 – 2024-04-03 (×15): 30 mg via ORAL
  Filled 2024-03-20 (×15): qty 1

## 2024-03-20 MED ORDER — FUROSEMIDE 10 MG/ML IJ SOLN
40.0000 mg | Freq: Once | INTRAMUSCULAR | Status: AC
  Administered 2024-03-20: 40 mg via INTRAVENOUS
  Filled 2024-03-20: qty 4

## 2024-03-20 MED ORDER — HEPARIN BOLUS VIA INFUSION
950.0000 [IU] | Freq: Once | INTRAVENOUS | Status: AC
  Administered 2024-03-20: 950 [IU] via INTRAVENOUS
  Filled 2024-03-20: qty 950

## 2024-03-20 MED ORDER — HYDROMORPHONE HCL 1 MG/ML IJ SOLN
0.5000 mg | Freq: Once | INTRAMUSCULAR | Status: AC
  Administered 2024-03-20: 0.5 mg via INTRAVENOUS

## 2024-03-20 MED ORDER — BOOST / RESOURCE BREEZE PO LIQD CUSTOM
1.0000 | Freq: Three times a day (TID) | ORAL | Status: DC
  Administered 2024-03-20 – 2024-03-23 (×7): 1 via ORAL

## 2024-03-20 MED ORDER — HYDROMORPHONE HCL 1 MG/ML IJ SOLN
INTRAMUSCULAR | Status: AC
  Filled 2024-03-20: qty 1

## 2024-03-20 NOTE — Progress Notes (Addendum)
 Patient ID: Hannah Yu, female   DOB: [birth year], 78 y.o.   MRN: [id number] [facility]    SUBJECTIVE: Lying in bed complains of weakness fatigue mild shortness of breath no pain no fever chills or sweats no palpitations or tachycardia   Vitals:   03/20/24 0800 03/20/24 0815 03/20/24 0830 03/20/24 0937  BP: (!) 139/93  (!) 131/90   Pulse: (!) 116 83 (!) 140   Resp: 13 16 20    Temp: 98.7 F (37.1 C)     TempSrc: Oral     SpO2: 93% 93% 93% 97%  Weight:      Height:         Intake/Output Summary (Last 24 hours) at 03/20/2024 0955 Last data filed at 03/20/2024 0810 Gross per 24 hour  Intake 855.55 ml  Output 1700 ml  Net -844.45 ml      PHYSICAL EXAM Ill-appearing female lying in bed slightly lethargic weak but alert and arousable General: Well developed, well nourished, in no acute distress HEENT:  Normocephalic and atramatic Neck:  No JVD.  Lungs: Clear bilaterally to auscultation and percussion. Heart: Tachycardia irregular. Normal S1 and S2 without gallops or murmurs.  Abdomen: Bowel sounds are positive, abdomen soft and non-tender  Msk:  Back normal, normal gait. Normal strength and tone for age. Extremities: No clubbing, cyanosis or edema.   Neuro: Alert and oriented X 3. Psych: Flat affect fatigue weakness, responds appropriately   LABS: Basic Metabolic Panel: Recent Labs    03/19/24 0326 03/20/24 0624  NA 120* 129*  K 4.6 4.2  CL 87* 92*  CO2 20* 22  GLUCOSE 165* 103*  BUN 37* 25*  CREATININE 1.14* 0.71  CALCIUM 7.7* 8.5*  MG 2.0 2.1  PHOS 4.5 3.6   Liver Function Tests: Recent Labs    03/18/24 1850  AST 54*  ALT 30  ALKPHOS 228*  BILITOT 0.3  PROT 6.7  ALBUMIN  3.0*   No results for input(s): LIPASE, AMYLASE in the last 72 hours. CBC: Recent Labs    03/18/24 1850 03/19/24 0326 03/20/24 0624  WBC 27.3* 31.2* 20.6*  NEUTROABS 24.0*  --   --   HGB 9.9* 8.8* 9.1*  HCT 30.7* 27.1* 28.3*  MCV 76.2* 75.7* 77.7*  PLT 465*  410* 457*   Cardiac Enzymes: No results for input(s): CKTOTAL, CKMB, CKMBINDEX, TROPONINI in the last 72 hours. BNP: Invalid input(s): POCBNP D-Dimer: No results for input(s): DDIMER in the last 72 hours. Hemoglobin A1C: No results for input(s): HGBA1C in the last 72 hours. Fasting Lipid Panel: No results for input(s): CHOL, HDL, LDLCALC, TRIG, CHOLHDL, LDLDIRECT in the last 72 hours. Thyroid  Function Tests: No results for input(s): TSH, T4TOTAL, T3FREE, THYROIDAB in the last 72 hours.  Invalid input(s): FREET3 Anemia Panel: No results for input(s): VITAMINB12, FOLATE, FERRITIN, TIBC, IRON, RETICCTPCT in the last 72 hours.  ECHOCARDIOGRAM COMPLETE Result Date: 03/19/2024    ECHOCARDIOGRAM REPORT   Patient Name:   Hannah Yu Date of Exam: 03/19/2024 Medical Rec #:  [id number]           Height:       60.0 in Accession #:    [id number]          Weight:       172.4 lb Date of Birth:  [birth year]            BSA:          1.752 m Patient Age:    60 years  BP:           109/78 mmHg Patient Gender: F                   HR:           94 bpm. Exam Location:  [facility] Procedure: 2D Echo, Color Doppler and Cardiac Doppler (Both Spectral and Color            Flow Doppler were utilized during procedure). STAT ECHO Indications:     Dyspnea R06.00  History:         Patient has prior history of Echocardiogram examinations, most                  recent 09/09/2019.  Sonographer:     Thedora Louder RDCS, FASE Referring Phys:  [id number] KHABIB DGAYLI Diagnosing Phys: Annabella Scarce MD IMPRESSIONS  1. Compared with the echo in 2021, right ventricular dilation and dysfunction are new. Consistent with known finding of pulmonary embolis.  2. Left ventricular ejection fraction, by estimation, is 60 to 65%. The left ventricle has normal function. The left ventricle has no regional wall motion abnormalities. Left ventricular diastolic parameters are consistent with  Grade I diastolic dysfunction (impaired relaxation).  3. Right ventricular systolic function is moderately reduced. The right ventricular size is moderately enlarged. There is severely elevated pulmonary artery systolic pressure.  4. Right atrial size was severely dilated.  5. The mitral valve is normal in structure. No evidence of mitral valve regurgitation. No evidence of mitral stenosis.  6. 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  7. The aortic valve is tricuspid. Aortic valve regurgitation is not visualized. No aortic stenosis is present.  8. The inferior vena cava is dilated in size with <50% respiratory variability, suggesting right atrial pressure of 15 mmHg. FINDINGS  Left Ventricle: Left ventricular ejection fraction, by estimation, is 60 to 65%. The left ventricle has normal function. The left ventricle has no regional wall motion abnormalities. The left ventricular internal cavity size was normal in size. There is  no left ventricular hypertrophy. Left ventricular diastolic parameters are consistent with Grade I diastolic dysfunction (impaired relaxation). Normal left ventricular filling pressure. Right Ventricle: The right ventricular size is moderately enlarged. No increase in right ventricular wall thickness. Right ventricular systolic function is moderately reduced. There is severely elevated pulmonary artery systolic pressure. The tricuspid regurgitant velocity is 3.46 m/s, and with an assumed right atrial pressure of 15 mmHg, the estimated right ventricular systolic pressure is 62.9 mmHg. Left Atrium: Left atrial size was normal in size. Right Atrium: Right atrial size was severely dilated. Pericardium: There is no evidence of pericardial effusion. Mitral Valve: The mitral valve is normal in structure. No evidence of mitral valve regurgitation. No evidence of mitral valve stenosis. Tricuspid Valve: The tricuspid valve is normal in structure. 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 No evidence of  tricuspid stenosis. Aortic Valve: The aortic valve is tricuspid. Aortic valve regurgitation is not visualized. No aortic stenosis is present. Aortic valve peak gradient measures 6.9 mmHg. Pulmonic Valve: The pulmonic valve was normal in structure. Pulmonic valve regurgitation is not visualized. No evidence of pulmonic stenosis. Aorta: The aortic root is normal in size and structure. Venous: The inferior vena cava is dilated in size with less than 50% respiratory variability, suggesting right atrial pressure of 15 mmHg. IAS/Shunts: No atrial level shunt detected by color flow Doppler.  LEFT VENTRICLE PLAX 2D LVIDd:         2.90 cm  Diastology LVIDs:         2.10 cm   LV e' medial:    10.00 cm/s LV IVS:        1.00 cm   LV E/e' medial:  6.9 LVOT diam:     1.90 cm   LV e' lateral:   13.60 cm/s LV SV:         30        LV E/e' lateral: 5.1 LV SV Index:   17 LVOT Area:     2.84 cm  RIGHT VENTRICLE RV Basal diam:  3.40 cm RV S prime:     9.46 cm/s TAPSE (M-mode): 1.6 cm LEFT ATRIUM           Index        RIGHT ATRIUM           Index LA Vol (A4C): 40.2 ml 22.94 ml/m  RA Area:     23.80 cm                                    RA Volume:   75.20 ml  42.91 ml/m  AORTIC VALVE                 PULMONIC VALVE AV Area (Vmax): 1.31 cm     PV Vmax:        0.76 m/s AV Vmax:        131.00 cm/s  PV Peak grad:   2.3 mmHg AV Peak Grad:   6.9 mmHg     RVOT Peak grad: 1 mmHg LVOT Vmax:      60.70 cm/s LVOT Vmean:     38.700 cm/s LVOT VTI:       0.105 m  AORTA Ao Root diam: 3.00 cm Ao Asc diam:  3.30 cm MITRAL VALVE               TRICUSPID VALVE MV Area (PHT): 2.24 cm    TR Peak grad:   47.9 mmHg MV Decel Time: 338 msec    TR Vmax:        346.00 cm/s MV E velocity: 68.70 cm/s MV A velocity: 98.00 cm/s  SHUNTS MV E/A ratio:  0.70        Systemic VTI:  0.10 m                            Systemic Diam: 1.90 cm Annabella Scarce MD Electronically signed by Annabella Scarce MD Signature Date/Time: 03/19/2024/11:15:10 AM    Final (Updated)     US  Venous Img Lower Bilateral (DVT) Result Date: 03/19/2024 CLINICAL DATA:  Pulmonary embolism. EXAM: BILATERAL LOWER EXTREMITY VENOUS DOPPLER ULTRASOUND TECHNIQUE: Gray-scale sonography with graded compression, as well as color Doppler and duplex ultrasound were performed to evaluate the lower extremity deep venous systems from the level of the common femoral vein and including the common femoral, femoral, profunda femoral, popliteal and calf veins including the posterior tibial, peroneal and gastrocnemius veins when visible. The superficial great saphenous vein was also interrogated. Spectral Doppler was utilized to evaluate flow at rest and with distal augmentation maneuvers in the common femoral, femoral and popliteal veins. COMPARISON:  None Available. FINDINGS: RIGHT LOWER EXTREMITY Common Femoral Vein: No evidence of thrombus. Normal compressibility, respiratory phasicity and response to augmentation. Saphenofemoral Junction: No evidence of thrombus. Normal compressibility and flow on color Doppler imaging.  Profunda Femoral Vein: No evidence of thrombus. Normal compressibility and flow on color Doppler imaging. Femoral Vein: No evidence of thrombus. Normal compressibility, respiratory phasicity and response to augmentation. Popliteal Vein: No evidence of thrombus. Normal compressibility, respiratory phasicity and response to augmentation. Calf Veins: Suggestion of nonocclusive thrombus in the visualized right posterior tibial and peroneal veins. Superficial Great Saphenous Vein: No evidence of thrombus. Normal compressibility. Venous Reflux:  None. Other Findings: No evidence of superficial thrombophlebitis or abnormal fluid collection. LEFT LOWER EXTREMITY Common Femoral Vein: No evidence of thrombus. Normal compressibility, respiratory phasicity and response to augmentation. Saphenofemoral Junction: No evidence of thrombus. Normal compressibility and flow on color Doppler imaging. Profunda Femoral  Vein: No evidence of thrombus. Normal compressibility and flow on color Doppler imaging. Femoral Vein: No evidence of thrombus. Normal compressibility, respiratory phasicity and response to augmentation. Popliteal Vein: No evidence of thrombus. Normal compressibility, respiratory phasicity and response to augmentation. Calf Veins: No evidence of thrombus. Normal compressibility and flow on color Doppler imaging. Superficial Great Saphenous Vein: No evidence of thrombus. Normal compressibility. Venous Reflux:  None. Other Findings: No evidence of superficial thrombophlebitis or abnormal fluid collection. IMPRESSION: 1. Suggestion of nonocclusive thrombus in the visualized right posterior tibial and peroneal veins. 2. No evidence of deep venous thrombosis in the left lower extremity. Electronically Signed   By: Marcey Moan M.D.   On: 03/19/2024 09:19   CT Angio Chest PE W and/or Wo Contrast Result Date: 03/18/2024 EXAM: CTA CHEST 03/18/2024 09:56:31 PM TECHNIQUE: CTA of the chest was performed after the administration of 75 mL of iohexol  (OMNIPAQUE ) 350 MG/ML injection. Multiplanar reformatted images are provided for review. MIP images are provided for review. Automated exposure control, iterative reconstruction, and/or weight based adjustment of the mA/kV was utilized to reduce the radiation dose to as low as reasonably achievable. COMPARISON: PET CT dated 12/21/2023 CLINICAL HISTORY: eval PE. lung cancer patient, acutely hypoxic, tachy. FINDINGS: PULMONARY ARTERIES: Pulmonary arteries are adequately opacified for evaluation. Segmental/subsegmental emboli in the right middle and lower lobe pulmonary arteries (images 57, 65, 66, and 70). Overall clot burden is small to moderate. Main pulmonary artery is normal in caliber. MEDIASTINUM: Right chest port terminates at the cavoatrial junction. Cardiomegaly. The pericardium demonstrates no acute abnormality. Mild thoracic aortic atherosclerosis. LYMPH NODES: No  mediastinal, hilar or axillary lymphadenopathy. LUNGS AND PLEURA: Multifocal patchy/nodular opacities in the left lung reflecting a combination of atelectasis and tumor, including a 2.0 cm perifissural nodule along the left fissure (image 34) and 4.5 cm pleural-based mass in the posterior left lower hemithorax (image 87). Overall grossly unchanged. Trace left pleural effusion. No pneumothorax. UPPER ABDOMEN: Cholelithiasis. Limited images of the upper abdomen demonstrate cholelithiasis. SOFT TISSUES AND BONES: Severe compression fracture deformity at T6 (sagittal image 81), new. Known multifocal osseous metastases are not evident on CT, although hypermetabolic on PET. No acute soft tissue abnormality. IMPRESSION: 1. Acute segmental/subsegmental pulmonary emboli in the right middle and lower lobe arteries. Small to moderate clot burden. 2. New severe T6 compression fracture deformity. 3. Left lung multifocal opacities reflecting atelectasis and tumor, overall grossly unchanged. 4. Known osseous metastases are better evaluated on PET. 5. Critical Value/emergent results were called by telephone at the time of interpretation on 03/18/2024 at 2215 hrs to provider Dr Claudene. Electronically signed by: Pinkie Pebbles MD 03/18/2024 10:19 PM EST RP Workstation: [id number]   DG Chest Port 1 View Result Date: 03/18/2024 EXAM: 1 VIEW(S) XRAY OF THE CHEST 03/18/2024 07:05:00 PM COMPARISON: 07/22/2023 CLINICAL HISTORY:  Questionable sepsis - evaluate for abnormality FINDINGS: LINES, TUBES AND DEVICES: Right chest wall port stable in position. LUNGS AND PLEURA: Small left pleural effusion. Retrocardiac airspace opacity. Developing left upper lobe airspace opacity. No pneumothorax. HEART AND MEDIASTINUM: Cardiomegaly, unchanged. Aortic arch atherosclerosis. BONES AND SOFT TISSUES: Rotator cuff anchor suture along the right shoulder noted. No acute osseous abnormality. IMPRESSION: 1. Developing left upper lobe airspace opacity  and retrocardiac airspace opacity, suspicious for pneumonia or aspiration. Follow-up PA and lateral chest x-ray is recommended in 3-4 weeks following therapy to ensure resolution and exclude underlying malignancy. 2. Small left pleural effusion with associated retroperitoneal airspace opacity . Electronically signed by: Kate Plummer MD 03/18/2024 07:27 PM EST RP Workstation: [id number]     Echo normal left ventricular function EF of 60% significant RV dysfunction elevated pulmonary pressures consistent with pulm hypertension  TELEMETRY: Sinus tach with PACs rate of 130:  ASSESSMENT AND PLAN:  Principal Problem:   Acute saddle pulmonary embolism (HCC) Active Problems:   Acute respiratory failure with hypoxia (HCC)   Acute pulmonary embolism without acute cor pulmonale (HCC)   Severe sepsis (HCC)   Mild pulmonic regurgitation and RV dysfunction by prior echocardiogram   AKI (acute kidney injury) Possible myocarditis  Plan Agree with supportive care anticoagulation for PE blood pressure support rate control sinus tach with PACs supplemental oxygen as necessary Inhalers as necessary supplemental oxygen respiratory support Pulmonary hypertension with RV dysfunction recommend supportive management and care Do not recommend right heart cath at this point to measure pulm hypertension values Continue antibiotic therapy for sepsis possible pneumonia 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

## 2024-03-20 NOTE — Progress Notes (Signed)
 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  He reports she is been nonambulatory due to generalized weakness and chemo related peripheral neuropathy.  Patient usually sits for long periods of time in the recliner and had a fall last week.  On EMS arrival patient was hypoxic with sats of 62% on room air.  She was placed on CPAP and transported to the ED.   ED Course: Initial vital signs showed HR of 130 beats/minute, BP 128/156mm Hg, the RR 21 breaths/minute,95 %O2 Sat on CPAP and Temp of 99.77F (36.4C).  Pertinent Labs/Diagnostics Findings: Na+/ K+: 117/5.0 Glucose:195 BUN/Cr.:35/1.05 AST/ALT54/30: CO2:21 AG:17 WBC: 27.3K/L Hgb/Hct:9.9/30.7 Plts:465  PCT: 11.5  Lactic acid: 4.6~2.7 COVID PCR: Negative,  HsTrop:349~365 [id number]   CXR>? Pneumonia, left pleural effusion CTA Chest> Positive for right segmental PE   Patient was started on Heparin  gtt and abx initated for CAP coverage. 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 IV furosemide  administered. Cardiology at bedside.   Interim History / Subjective:   Patient awake and alert, answers appropriately. Reports leg pain. Remains short of breath.  Objective    Blood pressure 109/62, pulse (!) 46, temperature 98.9 F (37.2 C), temperature source Axillary, resp. rate 16, height 5' (1.524 m), weight 67.6 kg, SpO2 98%.    FiO2 (%):  [40 %-60 %] 60 %   Intake/Output Summary (Last 24 hours) at 03/20/2024 1720 Last data filed at 03/20/2024 1500 Gross per 24 hour  Intake 968.43 ml  Output 1825 ml  Net -856.57 ml   Filed Weights   03/18/24 1828 03/19/24 0500 03/20/24 0500  Weight: 74.7 kg 78.2 kg 67.6 kg    Examination: Physical Exam Constitutional:      Appearance: She is ill-appearing.  Cardiovascular:     Rate and Rhythm: Regular rhythm. Tachycardia present.     Pulses: Normal pulses.     Heart sounds: Normal heart sounds.  Pulmonary:     Breath sounds: No wheezing or rales.  Abdominal:     Palpations: Abdomen is soft.  Musculoskeletal:     Right lower leg: Edema present.     Left lower leg: Edema present.  Neurological:     General: No focal deficit present.     Mental Status: She is alert and oriented to person, place, and time. Mental status is  at baseline.     Assessment and Plan   78 year old female with history of metastatic lung cancer who presented to the hospital with worsening shortness of breath found to have decompensated heart failure with RV dysfunction as well as acute segmental pulmonary embolism.  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 No sign of encephalopathy   CV - admitted with respiratory failure and increased oxygen requirements, with a significantly elevated proBNP as well as findings of pleural effusion. Echocardiogram shows a dilated RV, but clot burden on CT is minimal and does not explain RV strain. She is also contraindicated for thrombolysis given brain metastasis.   Suspect either RV infarct or pulmonary hypertension. Differential for pulmonary hypertension includes CTEPH (recommend V/Q scan), Bevacizumab induced cardiomyopathy or PAH, and ischemic cardiomyopathy. She was requiring vasopressor support on admission, with MAP > 65. Initial Co-ox showed central venous sat in the 50's, improved to the 70's on repeat today. Discussed with IR and clot burden felt to be minimal, she is also contraindicated for thrombolysis given brain metastasis. We have discussed further management of RV dysfunction with cardiology who do not recommend RHC at the moment not ionotropic support.             -goal MAP > 65 mmHg             -cardiology consult             -trend lactates  -IV Furosemide  40 mg once  -VQ scan  -consider ionotropic support in coordination with cardiology   Pulm - Acute hypoxic respiratory failure is more explained by decompensated heart failure and pulmonary hypertension rather than pulmonary embolism. LLL consolidation is chronic on my review, though she does have an elevated white count and an infectious process remains to be ruled out. She was transitioned to HFNC, and oxygenation is maintained. Continue heparin  for PE.   GI - resume diet. Resume magic wash for oral ulcers.   Renal - hyponatremia appears chronic and unchanged on review of her data. Does have a mild AKI that I suspect is due to shock and likely cardiorenal in etiology.   Endo - ICU glycemic protocol   Hem/Onc  - heparin  for PE   ID - CAP coverage with Ceftriaxone and Azithromycin. Respiratory cultures and Legionella/Strep urinary antigens ordered.  Other - I had a prolonged extensive conversation with the patient's POA going over the physiology and pathophysiology of her disease in detail.  We went over her underlying lung malignancy with metastases, as well as her current presentation with decompensated heart failure/pulmonary hypertension with quite dilated RV.  Discussed the differential diagnosis behind pulmonary hypertension including CTEPH, cardiomyopathy from bevacizumab or other chemotherapy, as well as likely underlying lung disease.  We discussed recommendations including dobutamine or other inotropic agent, as well as workup for pulm hypertension.  I answered all the POA's questions to the best of my ability.  We agreed that we will discuss her condition further with cardiology and come up with a plan of management that would satisfy all parties.  Labs   CBC: Recent Labs  Lab 03/16/24 0830 03/18/24 1850 03/19/24 0326 03/20/24 0624  WBC 17.7* 27.3* 31.2* 20.6*  NEUTROABS 14.5* 24.0*  --   --   HGB 10.8* 9.9* 8.8* 9.1*  HCT 32.7* 30.7* 27.1* 28.3*  MCV 74.1* 76.2*  75.7* 77.7*  PLT 411* 465* 410* 457*    Basic Metabolic Panel: Recent Labs  Lab 03/16/24 0830 03/18/24 1850 03/19/24 0326 03/20/24 0624  NA 118* 117* 120* 129*  K 3.9 5.0 4.6 4.2  CL 81* 80* 87* 92*  CO2 24 21* 20* 22  GLUCOSE 190* 195* 165* 103*  BUN 20 35* 37* 25*  CREATININE 0.69 1.05* 1.14* 0.71  CALCIUM 8.3* 8.6* 7.7* 8.5*  MG  --  2.1 2.0 2.1  PHOS  --   --  4.5 3.6   GFR: Estimated Creatinine Clearance: 48.9 mL/min (by C-G formula based on SCr of 0.71 mg/dL). Recent Labs  Lab 03/16/24 0830 03/18/24 1826 03/18/24 1850 03/18/24 2033 03/19/24 0326 03/19/24 1724 03/19/24 2007 03/20/24 0624  PROCALCITON  --   --   --  11.50  --   --   --   --   WBC 17.7*  --  27.3*  --  31.2*  --   --  20.6*   LATICACIDVEN  --    < >  --  2.7*  --  1.8 1.6 1.6   < > = values in this interval not displayed.    Liver Function Tests: Recent Labs  Lab 03/16/24 0830 03/18/24 1850  AST 24 54*  ALT 19 30  ALKPHOS 173* 228*  BILITOT 0.4 0.3  PROT 6.8 6.7  ALBUMIN  2.5* 3.0*   No results for input(s): LIPASE, AMYLASE in the last 168 hours. No results for input(s): AMMONIA in the last 168 hours.  ABG    Component Value Date/Time   O2SAT 79 03/20/2024 0821     Coagulation Profile: Recent Labs  Lab 03/18/24 1850  INR 1.1    Cardiac Enzymes: No results for input(s): CKTOTAL, CKMB, CKMBINDEX, TROPONINI in the last 168 hours.  HbA1C: No results found for: HGBA1C  CBG: Recent Labs  Lab 03/19/24 0015  GLUCAP 173*    Review of Systems:   N/A  Past Medical History:  She,  has a past medical history of Acid reflux, Anemia, Anesthesia complication, Arthritis, Difficult intubation, DVT (deep venous thrombosis) (HCC), Hypertension, and Sciatica.   Surgical History:   Past Surgical History:  Procedure Laterality Date   ABDOMINAL HYSTERECTOMY     CATARACT EXTRACTION W/ INTRAOCULAR LENS IMPLANT Bilateral 2007   eyes done within 1 month of each other.   IR IMAGING GUIDED PORT INSERTION  05/19/2023   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   TOTAL HIP ARTHROPLASTY Right 09/06/2019   Procedure: RIGHT TOTAL HIP ARTHROPLASTY ANTERIOR APPROACH;  Surgeon: Vernetta Lonni GRADE, MD;  Location: [facility] OR;  Service: Orthopedics;  Laterality: Right;   TOTAL HIP ARTHROPLASTY Left 07/31/2020   Procedure: LEFT TOTAL HIP ARTHROPLASTY ANTERIOR APPROACH;  Surgeon: Vernetta Lonni GRADE, MD;  Location: [facility] OR;  Service: Orthopedics;  Laterality: Left;   VIDEO BRONCHOSCOPY WITH ENDOBRONCHIAL ULTRASOUND N/A 10/06/2022   Procedure: VIDEO BRONCHOSCOPY WITH ENDOBRONCHIAL ULTRASOUND;  Surgeon: Parris Manna, MD;  Location: [facility]  ORS;  Service: Thoracic;  Laterality: N/A;     Social History:   reports that she quit smoking about 31 years ago. Her smoking use included cigarettes. She has never used smokeless tobacco. She reports current alcohol use of about 4.0 standard drinks of alcohol per week. She reports that she does not use drugs.   Family History:  Her family history includes Breast cancer (age of onset: 6) in her sister.   Allergies  Allergies  Allergen Reactions   Ezetimibe Other (See Comments)    Body aches, and stiffness   Codeine Diarrhea   Cortisone Swelling    injections   Lovastatin Other (See Comments)    Muscle cramps     Home Medications  Prior to Admission medications   Medication Sig Start Date End Date Taking? Authorizing Provider  aspirin  EC 81 MG tablet Take 162 mg by mouth daily. Swallow whole.   Yes [provider]  Biotin 10 MG CAPS  08/07/22  Yes [provider]  cholecalciferol  (VITAMIN D3) 25 MCG (1000 UNIT) tablet Take 1,000 Units by mouth daily.   Yes [provider]  diphenoxylate -atropine  (LOMOTIL ) 2.5-0.025 MG tablet Take 1 tablet by mouth 4 (four) times daily as needed for diarrhea or loose stools. 11/09/23  Yes Jacobo Evalene PARAS, MD  DULoxetine (CYMBALTA) 30 MG capsule Take 1 capsule (30 mg total) by mouth daily. 03/07/24  Yes Borders, Fonda SAUNDERS, NP  FIBER PO Take 1 tablet by mouth daily.   Yes [provider]  furosemide  (LASIX ) 40 MG tablet Take 40 mg by mouth daily. 40 mg alternating 80 mg every other day   Yes [provider]  ibuprofen (ADVIL) 200 MG tablet Take 400 mg by mouth every 6 (six) hours as needed for mild pain or moderate pain.   Yes [provider]  Ibuprofen-diphenhydrAMINE  HCl (IBUPROFEN PM) 200-25 MG CAPS Take 2 tablets by mouth at bedtime as needed (sleep).   Yes [provider]  LORazepam (ATIVAN) 0.5 MG tablet Take by mouth. 09/04/23  Yes [provider]  losartan  (COZAAR ) 100 MG  tablet Take 100 mg by mouth daily. 07/15/19  Yes [provider]  magic mouthwash (multi-ingredient) oral suspension Swish and swallow 5-10 mLs 4 (four) times daily as needed. 02/26/24  Yes Jacobo Evalene PARAS, MD  magic mouthwash w/lidocaine  SOLN Take 5 mLs by mouth 4 (four) times daily. Suspension contains equal amounts of Maalox Extra Strength, nystatin, diphenhydramine  and lidocaine . 03/07/24  Yes Borders, Joshua R, NP  magnesium oxide (MAG-OX) 400 (240 Mg) MG tablet Take 400 mg by mouth daily.   Yes [provider]  methylPREDNISolone  (MEDROL  DOSEPAK) 4 MG TBPK tablet Start with 6 tablets for 1st day and then reduce by one tablet daily until completed. 02/11/24  Yes Borders, Fonda SAUNDERS, NP  metoprolol  succinate (TOPROL -XL) 50 MG 24 hr tablet Take 50 mg by mouth daily. 06/24/19  Yes [provider]  omeprazole (PRILOSEC) 40 MG capsule Take 40 mg by mouth daily as needed.   Yes [provider]  ondansetron  (ZOFRAN ) 8 MG tablet Take 1 tablet (8 mg total) by mouth every 8 (eight) hours as needed for nausea or vomiting. 01/08/24  Yes Jacobo Evalene PARAS, MD  oxyCODONE  (OXY IR/ROXICODONE ) 5 MG immediate release tablet Take 1-2 tablets (5-10 mg total) by mouth every 4 (four) hours as needed for severe pain (pain score 7-10). 02/22/24  Yes Jacobo Evalene PARAS, MD  pregabalin (LYRICA) 25 MG capsule Take 1 capsule (25 mg total) by mouth 2 (two) times daily. 03/16/24  Yes Borders, Fonda SAUNDERS, NP  prochlorperazine  (COMPAZINE ) 10 MG tablet TAKE ONE TABLET BY MOUTH EVERY 6 HOURS AS NEEDED FOR NAUSEA / VOMITING 09/24/23  Yes Leonard, Alyson N, RPH-CPP  sodium chloride  1 g tablet Take 1 tablet (1 g total) by mouth 3 (three) times daily with meals. 03/16/24  Yes Jacobo Evalene PARAS, MD  sucralfate (CARAFATE) 1 g tablet Take 1 tablet (1  g total) by mouth 3 (three) times daily before meals. Dissolve tablet in 4 T warm water swish and swallow 30 minutes before meals. 03/16/24 04/15/24 Yes  Borders, Fonda SAUNDERS, NP  Tetrahydrozoline HCl (VISINE OP) Place 1 drop into both eyes daily as needed (dry eyes).   Yes [provider]  vitamin B-12 (CYANOCOBALAMIN ) 1000 MCG tablet Take 1,000 mcg by mouth daily.   Yes [provider]  OVER THE COUNTER MEDICATION Take 1 capsule by mouth daily. CBD gummie    [provider]     The patient is critically ill due to acute hypoxic respiratory failure, pulmonary hypertension, RV dysfunction.  Critical care was necessary to treat or prevent imminent or life-threatening deterioration. I personally performed titration, monitoring, and management of vasopressor/ionotrope infusion, non-invasive positive pressure ventilation management and titration, and invasive cardiac monitoring (including management of complex cardiac hemodynamics). Critical care time was spent by me on the following activities: development of a treatment plan with the patient and/or surrogate as well as nursing, discussions with consultants, evaluation of the patient's response to treatment, examination of the patient, obtaining a history from the patient or surrogate, ordering and performing treatments and interventions, ordering and review of laboratory studies, ordering and review of radiographic studies, review of telemetry data including pulse oximetry, re-evaluation of patient's condition and participation in multidisciplinary rounds.   I personally spent 93 minutes providing critical care not including any separately billable procedures.   Belva November, MD [facility] 03/20/2024 5:20 PM

## 2024-03-20 NOTE — Consult Note (Signed)
 PHARMACY CONSULT NOTE - ELECTROLYTES  Pharmacy Consult for Electrolyte Monitoring and Replacement   Recent Labs: Height: 5' (152.4 cm) Weight: 67.6 kg (149 lb 0.5 oz) IBW/kg (Calculated) : 45.5 Estimated Creatinine Clearance: 48.9 mL/min (by C-G formula based on SCr of 0.71 mg/dL). Potassium (mmol/L)  Date Value  03/20/2024 4.2   Magnesium (mg/dL)  Date Value  88/83/7974 2.1   Calcium (mg/dL)  Date Value  88/83/7974 8.5 (L)   Albumin  (g/dL)  Date Value  88/85/7974 3.0 (L)   Phosphorus (mg/dL)  Date Value  88/83/7974 3.6   Sodium (mmol/L)  Date Value  03/20/2024 129 (L)   Corrected Ca: 9.3 mg/dL  Assessment  Hannah Yu is a 78 y.o. female presenting with shortness of breath. 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 Pharmacy has been consulted to monitor and replace electrolytes.  Diet: NPO MIVF: N/A Pertinent medications: N/A  Goal of Therapy: Electrolytes WNL  Plan:  No replacement currently indicated Check BMP, Mg, Phos with AM labs  Thank you for allowing pharmacy to be a part of this patient's care.  Estill CHRISTELLA Lutes, PharmD, BCPS Clinical Pharmacist 03/20/2024 7:37 AM

## 2024-03-20 NOTE — Progress Notes (Signed)
 ANTICOAGULATION CONSULT NOTE  Pharmacy Consult for heparin  infusion Indication: NSTEMI and new onset Afib RVR with PE  Allergies  Allergen Reactions   Ezetimibe Other (See Comments)    Body aches, and stiffness   Codeine Diarrhea   Cortisone Swelling    injections   Lovastatin Other (See Comments)    Muscle cramps    Patient Measurements: Height: 5' (152.4 cm) Weight: 67.6 kg (149 lb 0.5 oz) IBW/kg (Calculated) : 45.5 HEPARIN  DW (KG): 62.2  Vital Signs: Temp: 98.2 F (36.8 C) (11/16 0400) Temp Source: Oral (11/16 0400) BP: 144/90 (11/16 0530) Pulse Rate: 111 (11/16 0530)  Labs: Recent Labs    03/18/24 1850 03/18/24 1850 03/18/24 2250 03/19/24 0326 03/19/24 1123 03/19/24 2007 03/20/24 0624  HGB 9.9*  --   --  8.8*  --   --  9.1*  HCT 30.7*  --   --  27.1*  --   --  28.3*  PLT 465*  --   --  410*  --   --  457*  APTT  --   --  26  --   --   --   --   LABPROT 14.6  --   --   --   --   --   --   INR 1.1  --   --   --   --   --   --   HEPARINUNFRC  --    < >  --  0.30 0.25* 0.26* 0.29*  CREATININE 1.05*  --   --  1.14*  --   --   --    < > = values in this interval not displayed.    Estimated Creatinine Clearance: 34.3 mL/min (A) (by C-G formula based on SCr of 1.14 mg/dL (H)).   Medical History: Past Medical History:  Diagnosis Date   Acid reflux    Anemia    Anesthesia complication    Tachycardia previously, now on metoprolol    Arthritis    09/02/2019: per patient have it real bad in both hands and back   Difficult intubation    had to terminate intubation unable to advance tube for cholecystectomy 2017?   DVT (deep venous thrombosis) (HCC)    leg   Hypertension    Sciatica    09/02/2019: has had it for about 3 years    Assessment: Pt is a 78 yo female presenting to ED c/o SOB and fall, found with new onset Afib , found with elevated Troponin level trending up and BNP.  Goal of Therapy:  Heparin  level 0.3-0.7 units/ml Monitor platelets by  anticoagulation protocol: Yes  11/15 0326 HL 0.30, therapeutic x 1 11/15 1123 HL 0.25, subtherapeutic@950  units/hr 11/15 2007 HL 0.26, subtherapeutic @ 1100 units/hr 11/16 0624 HL 0.29, subtherapeutic   Plan:  Bolus 950 units x 1 Increase heparin  infusion to 1350 units/hr Recheck HL in 8 hr after rate change CBC daily while on heparin   Rankin CANDIE Dills, PharmD, [facility] 03/20/2024 7:08 AM

## 2024-03-20 NOTE — Progress Notes (Signed)
 Patient c/o left leg pain most of the day. She does not want no one to touch her leg and it is painful for her to turn or move leg. Made ICU NP aware of same. New orders placed for x-ray and dilaudid  0.5 IV x1.

## 2024-03-20 NOTE — Plan of Care (Signed)
  Problem: Education: Goal: Knowledge of General Education information will improve Description: Including pain rating scale, medication(s)/side effects and non-pharmacologic comfort measures Outcome: Progressing   Problem: Nutrition: Goal: Adequate nutrition will be maintained Outcome: Progressing   Problem: Coping: Goal: Level of anxiety will decrease Outcome: Progressing   Problem: Pain Managment: Goal: General experience of comfort will improve and/or be controlled Outcome: Progressing   Problem: Safety: Goal: Ability to remain free from injury will improve Outcome: Progressing   Problem: Skin Integrity: Goal: Risk for impaired skin integrity will decrease Outcome: Progressing

## 2024-03-20 NOTE — Plan of Care (Signed)
  Problem: Clinical Measurements: Goal: Will remain free from infection Outcome: Progressing   Problem: Coping: Goal: Level of anxiety will decrease Outcome: Progressing   Problem: Elimination: Goal: Will not experience complications related to bowel motility Outcome: Progressing Goal: Will not experience complications related to urinary retention Outcome: Progressing   Problem: Pain Managment: Goal: General experience of comfort will improve and/or be controlled Outcome: Progressing   Problem: Safety: Goal: Ability to remain free from injury will improve Outcome: Progressing

## 2024-03-20 NOTE — Care Management Important Message (Signed)
 Important Message  Patient Details  Name: Hannah Yu MRN: [id number] Date of Birth: 1945-12-21   Important Message Given:  Yes - Medicare IM     [name] 03/20/2024, 6:04 PM

## 2024-03-20 NOTE — Progress Notes (Signed)
 ANTICOAGULATION CONSULT NOTE  Pharmacy Consult for heparin  infusion Indication: NSTEMI and new onset Afib RVR with PE  Allergies  Allergen Reactions   Ezetimibe Other (See Comments)    Body aches, and stiffness   Codeine Diarrhea   Cortisone Swelling    injections   Lovastatin Other (See Comments)    Muscle cramps    Patient Measurements: Height: 5' (152.4 cm) Weight: 67.6 kg (149 lb 0.5 oz) IBW/kg (Calculated) : 45.5 HEPARIN  DW (KG): 62.2  Vital Signs: Temp: 98.6 F (37 C) (11/16 1200) Temp Source: Oral (11/16 1200) BP: 127/87 (11/16 1500) Pulse Rate: 135 (11/16 1515)  Labs: Recent Labs    03/18/24 1850 03/18/24 2250 03/19/24 0326 03/19/24 1123 03/19/24 2007 03/20/24 0624 03/20/24 1509  HGB 9.9*  --  8.8*  --   --  9.1*  --   HCT 30.7*  --  27.1*  --   --  28.3*  --   PLT 465*  --  410*  --   --  457*  --   APTT  --  26  --   --   --   --   --   LABPROT 14.6  --   --   --   --   --   --   INR 1.1  --   --   --   --   --   --   HEPARINUNFRC  --   --  0.30   < > 0.26* 0.29* 0.32  CREATININE 1.05*  --  1.14*  --   --  0.71  --    < > = values in this interval not displayed.    Estimated Creatinine Clearance: 48.9 mL/min (by C-G formula based on SCr of 0.71 mg/dL).   Medical History: Past Medical History:  Diagnosis Date   Acid reflux    Anemia    Anesthesia complication    Tachycardia previously, now on metoprolol    Arthritis    09/02/2019: per patient have it real bad in both hands and back   Difficult intubation    had to terminate intubation unable to advance tube for cholecystectomy 2017?   DVT (deep venous thrombosis) (HCC)    leg   Hypertension    Sciatica    09/02/2019: has had it for about 3 years    Assessment: Pt is a 78 yo female presenting to ED c/o SOB and fall, found with new onset Afib , found with elevated Troponin level trending up and BNP.  Goal of Therapy:  Heparin  level 0.3-0.7 units/ml Monitor platelets by anticoagulation  protocol: Yes  11/15 0326 HL 0.30, therapeutic x 1 11/15 1123 HL 0.25, subtherapeutic@950  units/hr 11/15 2007 HL 0.26, subtherapeutic @ 1100 units/hr 11/16 0624 HL 0.29, subtherapeutic 11/16 1509 HL 0.32, therapeutic x 1   Plan:  Therapeutic x 1, but on lower end of goal Will increase heparin  infusion slightly to 1400 units/hr Recheck HL in 8 hr CBC daily while on heparin   Vickii Volland A Jerzee Jerome, PharmD Clinical Pharmacist 03/20/2024 3:50 PM

## 2024-03-21 ENCOUNTER — Inpatient Hospital Stay

## 2024-03-21 ENCOUNTER — Other Ambulatory Visit: Payer: Self-pay | Admitting: Oncology

## 2024-03-21 DIAGNOSIS — I2699 Other pulmonary embolism without acute cor pulmonale: Secondary | ICD-10-CM

## 2024-03-21 DIAGNOSIS — J9601 Acute respiratory failure with hypoxia: Secondary | ICD-10-CM | POA: Diagnosis not present

## 2024-03-21 DIAGNOSIS — Z515 Encounter for palliative care: Secondary | ICD-10-CM

## 2024-03-21 DIAGNOSIS — I2602 Saddle embolus of pulmonary artery with acute cor pulmonale: Secondary | ICD-10-CM | POA: Diagnosis not present

## 2024-03-21 LAB — COOXEMETRY PANEL
Carboxyhemoglobin: 0.7 % (ref 0.5–1.5)
Methemoglobin: <0.7 % (ref 0.0–1.5)
O2 Saturation: 51.7 %
Total hemoglobin: 9 g/dL — ABNORMAL LOW (ref 12.0–16.0)
Total oxygen content: 51.2 %

## 2024-03-21 LAB — CBC
HCT: 26.2 % — ABNORMAL LOW (ref 36.0–46.0)
Hemoglobin: 8 g/dL — ABNORMAL LOW (ref 12.0–15.0)
MCH: 24.3 pg — ABNORMAL LOW (ref 26.0–34.0)
MCHC: 30.5 g/dL (ref 30.0–36.0)
MCV: 79.6 fL — ABNORMAL LOW (ref 80.0–100.0)
Platelets: 446 K/uL — ABNORMAL HIGH (ref 150–400)
RBC: 3.29 MIL/uL — ABNORMAL LOW (ref 3.87–5.11)
RDW: 23.1 % — ABNORMAL HIGH (ref 11.5–15.5)
WBC: 15.1 K/uL — ABNORMAL HIGH (ref 4.0–10.5)
nRBC: 0.2 % (ref 0.0–0.2)

## 2024-03-21 LAB — RESPIRATORY PANEL BY PCR

## 2024-03-21 LAB — BASIC METABOLIC PANEL WITH GFR
Anion gap: 9 (ref 5–15)
BUN: 17 mg/dL (ref 8–23)
CO2: 25 mmol/L (ref 22–32)
Calcium: 8.5 mg/dL — ABNORMAL LOW (ref 8.9–10.3)
Chloride: 95 mmol/L — ABNORMAL LOW (ref 98–111)
Creatinine, Ser: 0.63 mg/dL (ref 0.44–1.00)
GFR, Estimated: >60 mL/min (ref >60)
Glucose, Bld: 112 mg/dL — ABNORMAL HIGH (ref 70–99)
Potassium: 4.4 mmol/L (ref 3.5–5.1)
Sodium: 129 mmol/L — ABNORMAL LOW (ref 135–145)

## 2024-03-21 LAB — HEPARIN LEVEL (UNFRACTIONATED)
Heparin Unfractionated: 0.28 [IU]/mL — ABNORMAL LOW (ref 0.30–0.70)
Heparin Unfractionated: 0.3 [IU]/mL (ref 0.30–0.70)
Heparin Unfractionated: 0.37 [IU]/mL (ref 0.30–0.70)

## 2024-03-21 LAB — HEPATIC FUNCTION PANEL
ALT: 27 U/L (ref 0–44)
AST: 26 U/L (ref 15–41)
Albumin: 2.7 g/dL — ABNORMAL LOW (ref 3.5–5.0)
Alkaline Phosphatase: 240 U/L — ABNORMAL HIGH (ref 38–126)
Bilirubin, Direct: 0.1 mg/dL (ref 0.0–0.2)
Indirect Bilirubin: 0.1 mg/dL — ABNORMAL LOW (ref 0.3–0.9)
Total Bilirubin: 0.2 mg/dL (ref 0.0–1.2)
Total Protein: 5.8 g/dL — ABNORMAL LOW (ref 6.5–8.1)

## 2024-03-21 LAB — PRO BRAIN NATRIURETIC PEPTIDE: Pro Brain Natriuretic Peptide: 14216 pg/mL — ABNORMAL HIGH (ref <300.0)

## 2024-03-21 MED ORDER — ALPRAZOLAM 0.5 MG PO TABS
0.5000 mg | ORAL_TABLET | Freq: Every day | ORAL | Status: DC
  Administered 2024-03-21 – 2024-04-02 (×12): 0.5 mg via ORAL
  Filled 2024-03-21 (×12): qty 1

## 2024-03-21 MED ORDER — AMIODARONE HCL IN DEXTROSE 360-4.14 MG/200ML-% IV SOLN
30.0000 mg/h | INTRAVENOUS | Status: AC
  Administered 2024-03-21 – 2024-03-24 (×7): 30 mg/h via INTRAVENOUS
  Filled 2024-03-21 (×6): qty 200

## 2024-03-21 MED ORDER — METOPROLOL TARTRATE 25 MG PO TABS
12.5000 mg | ORAL_TABLET | Freq: Two times a day (BID) | ORAL | Status: DC
  Administered 2024-03-21 (×2): 12.5 mg via ORAL
  Filled 2024-03-21 (×2): qty 1

## 2024-03-21 MED ORDER — HEPARIN BOLUS VIA INFUSION
900.0000 [IU] | Freq: Once | INTRAVENOUS | Status: AC
  Administered 2024-03-21: 900 [IU] via INTRAVENOUS
  Filled 2024-03-21: qty 900

## 2024-03-21 MED ORDER — BISACODYL 10 MG RE SUPP
10.0000 mg | Freq: Every day | RECTAL | Status: DC | PRN
  Administered 2024-04-02: 10 mg via RECTAL
  Filled 2024-03-21 (×3): qty 1

## 2024-03-21 MED ORDER — FUROSEMIDE 10 MG/ML IJ SOLN
20.0000 mg | Freq: Once | INTRAMUSCULAR | Status: AC
  Administered 2024-03-21: 20 mg via INTRAVENOUS
  Filled 2024-03-21: qty 2

## 2024-03-21 MED ORDER — SIMETHICONE 80 MG PO CHEW
80.0000 mg | CHEWABLE_TABLET | Freq: Four times a day (QID) | ORAL | Status: DC | PRN
  Administered 2024-03-21 – 2024-03-27 (×4): 80 mg via ORAL
  Filled 2024-03-21 (×6): qty 1

## 2024-03-21 MED ORDER — ACETAMINOPHEN 325 MG PO TABS
650.0000 mg | ORAL_TABLET | Freq: Four times a day (QID) | ORAL | Status: DC | PRN
  Administered 2024-03-21: 650 mg via ORAL
  Filled 2024-03-21: qty 2

## 2024-03-21 MED ORDER — AMIODARONE HCL IN DEXTROSE 360-4.14 MG/200ML-% IV SOLN
60.0000 mg/h | INTRAVENOUS | Status: AC
  Administered 2024-03-21 (×2): 60 mg/h via INTRAVENOUS
  Filled 2024-03-21 (×2): qty 200

## 2024-03-21 MED ORDER — ALUM & MAG HYDROXIDE-SIMETH 200-200-20 MG/5ML PO SUSP
15.0000 mL | Freq: Four times a day (QID) | ORAL | Status: DC | PRN
  Administered 2024-03-21 – 2024-03-22 (×3): 15 mL via ORAL
  Filled 2024-03-21 (×3): qty 30

## 2024-03-21 MED ORDER — AMIODARONE LOAD VIA INFUSION
150.0000 mg | Freq: Once | INTRAVENOUS | Status: AC
  Administered 2024-03-21: 150 mg via INTRAVENOUS
  Filled 2024-03-21: qty 83.34

## 2024-03-21 NOTE — Progress Notes (Signed)
 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  He reports she is been nonambulatory due to generalized weakness and chemo related peripheral neuropathy.  Patient usually sits for long periods of time in the recliner and had a fall last week.  On EMS arrival patient was hypoxic with sats of 62% on room air.  She was placed on CPAP and transported to the ED.   ED Course: Initial vital signs showed HR of 130 beats/minute, BP 128/159mm Hg, the RR 21 breaths/minute,95 %O2 Sat on CPAP and Temp of 99.76F (36.4C).  Pertinent Labs/Diagnostics Findings: Na+/ K+: 117/5.0 Glucose:195 BUN/Cr.:35/1.05 AST/ALT54/30: CO2:21 AG:17 WBC: 27.3K/L Hgb/Hct:9.9/30.7 Plts:465  PCT: 11.5  Lactic acid: 4.6~2.7 COVID PCR: Negative,  HsTrop:349~365 [id number]   CXR>? Pneumonia, left pleural effusion CTA Chest> Positive for right segmental PE   Patient was started on Heparin  gtt and abx initated for CAP coverage. 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 B12 deficiency  Chronic acquired lymphedema  Diverticulitis  GERD (gastroesophageal reflux disease)  History of cataract  History of mammogram 03/01/2013  Hyperlipidemia  Hypertension  Osteoarthritis  SVT DVT  Micro Data:    Blood x2 11/14>>NGTD MRSA PCR 11/15>>negative  COVID 11/15>>negative  Influenza A&B 11/15>>negative  RSV 11/15>>negative  RVP 11/17>>  Significant Hospital Events: Including procedures, antibiotic start and stop dates in addition to other pertinent events   11/14: Admitted to ICU with acute hypoxic respiratory failure iso PE and possible pneumonia 11/15: TTE with RV dilation and dysfunction, switched to HFNC from BiPAP 11/16: ON HFNC, family updated at the bedside. IV furosemide  administered. Cardiology at bedside. 11/17: No acute events overnight HFNC weaned from 10L to 8L.  Not requiring vasopressors.  EKG revealed atrial fibrillation with rvr hr 140 to 160's.  Cardiology following started po metoprolol  and amiodarone bolus followed by gtt.   Interim History / Subjective:  Pt c/o gas but otherwise states she feels better today   Objective    Blood pressure 118/74, pulse (!) 157, temperature (!) 100.4 F (38 C), temperature source Axillary, resp. rate 19, height 5' (1.524 m), weight 78.2 kg, SpO2 98%.        Intake/Output Summary (Last 24 hours) at 03/21/2024 1043 Last data filed at 03/21/2024 0815 Gross per 24 hour  Intake 941.55 ml  Output 1040 ml  Net -98.45 ml   Filed Weights   03/19/24 0500 03/20/24 0500 03/21/24 0500  Weight: 78.2 kg 67.6 kg 78.2 kg   Examination: General: Acute on chronically-ill appearing female, NAD on 8L HFNC  HENT: Supple, no JVD Lungs: Faint rhonchi throughout, even, non labored  Cardiovascular: Irregular irregular, no m/r/g, 2+ radial/1+ distal pulses, 1+ bilateral lower extremity edema  Abdomen: +BS x4, non distended, non tender  Extremities: Normal bulk and tone, moves all extremities  Neuro: Alert and oriented, follows  commands, PERRLA  GU: Indwelling foley catheter draining yellow urine   Resolved problem list   Assessment and Plan   #Acute segmental PE #Cardiogenic shock  #Pulmonary HTN  #RV Dysfunction  #Atrial fibrillation  with RVR Echo 03/19/24: EF 60 to 65%, grade I diastolic dysfunction, right ventricle size moderately enlarged, severely elevated pulmonary artery systolic pressures, severe tricuspid valve regurgitation - Continuous telemetry monitoring  - Prn levophed gtt to maintain map>65: currently not requiring  - Scheduled metoprolol  and amiodarone gtt for rate control  - Continue heparin  gtt  - Diurese as renal function and bp permits: currently net negative 1.2L  - Cardiology consulted appreciate input: does not recommend pursuing right heart cath or inotrope at this time unlikely would provide any additional information or change overall management  - IR consulted for acute segmental PE clot burden minimal and thrombolysis contraindicated due to brain metastasis    #Acute hypoxic respiratory failure  #Possible pneumonia  #Metastatic adenocarcinoma  - Supplemental O2 for dyspnea and/or hypoxia  - Maintain O2 sats 92% or higher  - Pulmonary hygiene as tolerated - Respiratory viral panel pending  - Trend WBC and monitor fever curve  - Continue abx as outlined above pending culture results and sensitivities   #Hyponatremia  - Trend BMP  - Replace electrolytes as indicated  - Strict I&O's - Avoid nephrotoxic agents as able   #Endo - Follow hypo/hyperglycemic protocol  - Target CBG readings 140 to 180  Labs   CBC: Recent Labs  Lab 03/16/24 0830 03/18/24 1850 03/19/24 0326 03/20/24 0624 03/21/24 0451  WBC 17.7* 27.3* 31.2* 20.6* 15.1*  NEUTROABS 14.5* 24.0*  --   --   --   HGB 10.8* 9.9* 8.8* 9.1* 8.0*  HCT 32.7* 30.7* 27.1* 28.3* 26.2*  MCV 74.1* 76.2* 75.7* 77.7* 79.6*  PLT 411* 465* 410* 457* 446*    Basic Metabolic Panel: Recent Labs  Lab 03/16/24 0830 03/18/24 1850 03/19/24 0326 03/20/24 0624 03/21/24 0451  NA 118* 117* 120* 129* 129*  K 3.9 5.0 4.6 4.2 4.4  CL 81* 80* 87* 92* 95*  CO2 24 21* 20* 22 25  GLUCOSE 190* 195* 165* 103* 112*  BUN 20 35* 37* 25* 17   CREATININE 0.69 1.05* 1.14* 0.71 0.63  CALCIUM 8.3* 8.6* 7.7* 8.5* 8.5*  MG  --  2.1 2.0 2.1  --   PHOS  --   --  4.5 3.6  --    GFR: Estimated Creatinine Clearance: 52.8 mL/min (by C-G formula based on SCr of 0.63 mg/dL). Recent Labs  Lab 03/18/24 1850 03/18/24 2033 03/19/24 0326 03/19/24 1724 03/19/24 2007 03/20/24 0624 03/21/24 0451  PROCALCITON  --  11.50  --   --   --   --   --   WBC 27.3*  --  31.2*  --   --  20.6* 15.1*  LATICACIDVEN  --  2.7*  --  1.8 1.6 1.6  --     Liver Function Tests: Recent Labs  Lab 03/16/24 0830 03/18/24 1850 03/21/24 0451  AST 24 54* 26  ALT 19 30 27   ALKPHOS 173* 228* 240*  BILITOT 0.4 0.3 0.2  PROT 6.8 6.7 5.8*  ALBUMIN  2.5* 3.0* 2.7*   No results for input(s): LIPASE, AMYLASE in the last 168 hours. No results for input(s): AMMONIA in the last 168 hours.  ABG    Component Value Date/Time   PHART 7.38 03/20/2024 1729   PCO2ART 46 03/20/2024 1729   PO2ART 97 03/20/2024 1729  HCO3 27.2 03/20/2024 1729   O2SAT 51.7 03/21/2024 0902     Coagulation Profile: Recent Labs  Lab 03/18/24 1850  INR 1.1    Cardiac Enzymes: No results for input(s): CKTOTAL, CKMB, CKMBINDEX, TROPONINI in the last 168 hours.  HbA1C: No results found for: HGBA1C  CBG: Recent Labs  Lab 03/19/24 0015  GLUCAP 173*    Review of Systems: Positives in BOLD   Gen: Denies fever, chills, weight change, fatigue, night sweats HEENT: Denies blurred vision, double vision, hearing loss, tinnitus, sinus congestion, rhinorrhea, sore throat, neck stiffness, dysphagia PULM: Denies shortness of breath, cough, sputum production, hemoptysis, wheezing CV: Denies chest pain, edema, orthopnea, paroxysmal nocturnal dyspnea, palpitations GI: abdominal pain/gas, nausea, vomiting, diarrhea, hematochezia, melena, constipation, change in bowel habits GU: Denies dysuria, hematuria, polyuria, oliguria, urethral discharge Endocrine: Denies hot or cold  intolerance, polyuria, polyphagia or appetite change Derm: Denies rash, dry skin, scaling or peeling skin change Heme: Denies easy bruising, bleeding, bleeding gums Neuro: Denies headache, numbness, weakness, slurred speech, loss of memory or consciousness  Past Medical History:  She,  has a past medical history of Acid reflux, Anemia, Anesthesia complication, Arthritis, Difficult intubation, DVT (deep venous thrombosis) (HCC), Hypertension, and Sciatica.   Surgical History:   Past Surgical History:  Procedure Laterality Date   ABDOMINAL HYSTERECTOMY     CATARACT EXTRACTION W/ INTRAOCULAR LENS IMPLANT Bilateral 2007   eyes done within 1 month of each other.   IR IMAGING GUIDED PORT INSERTION  05/19/2023   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   TOTAL HIP ARTHROPLASTY Right 09/06/2019   Procedure: RIGHT TOTAL HIP ARTHROPLASTY ANTERIOR APPROACH;  Surgeon: Vernetta Lonni GRADE, MD;  Location: [facility] OR;  Service: Orthopedics;  Laterality: Right;   TOTAL HIP ARTHROPLASTY Left 07/31/2020   Procedure: LEFT TOTAL HIP ARTHROPLASTY ANTERIOR APPROACH;  Surgeon: Vernetta Lonni GRADE, MD;  Location: [facility] OR;  Service: Orthopedics;  Laterality: Left;   VIDEO BRONCHOSCOPY WITH ENDOBRONCHIAL ULTRASOUND N/A 10/06/2022   Procedure: VIDEO BRONCHOSCOPY WITH ENDOBRONCHIAL ULTRASOUND;  Surgeon: Parris Manna, MD;  Location: [facility] ORS;  Service: Thoracic;  Laterality: N/A;     Social History:   reports that she quit smoking about 31 years ago. Her smoking use included cigarettes. She has never used smokeless tobacco. She reports current alcohol use of about 4.0 standard drinks of alcohol per week. She reports that she does not use drugs.   Family History:  Her family history includes Breast cancer (age of onset: 61) in her sister.   Allergies Allergies  Allergen Reactions   Ezetimibe Other (See Comments)    Body aches, and stiffness    Codeine Diarrhea   Cortisone Swelling    injections   Lovastatin Other (See Comments)    Muscle cramps     Home Medications  Prior to Admission medications   Medication Sig Start Date End Date Taking? Authorizing Provider  aspirin  EC 81 MG tablet Take 162 mg by mouth daily. Swallow whole.   Yes [provider]  Biotin 10 MG CAPS  08/07/22  Yes [provider]  cholecalciferol  (VITAMIN D3) 25 MCG (1000 UNIT) tablet Take 1,000 Units by mouth daily.   Yes [provider]  diphenoxylate -atropine  (LOMOTIL ) 2.5-0.025 MG tablet Take 1 tablet by mouth 4 (four) times daily as needed for diarrhea or loose stools. 11/09/23  Yes Jacobo Evalene PARAS, MD  DULoxetine (CYMBALTA) 30 MG capsule Take 1 capsule (30 mg total) by  mouth daily. 03/07/24  Yes Borders, Fonda SAUNDERS, NP  FIBER PO Take 1 tablet by mouth daily.   Yes [provider]  furosemide  (LASIX ) 40 MG tablet Take 40 mg by mouth daily. 40 mg alternating 80 mg every other day   Yes [provider]  ibuprofen (ADVIL) 200 MG tablet Take 400 mg by mouth every 6 (six) hours as needed for mild pain or moderate pain.   Yes [provider]  Ibuprofen-diphenhydrAMINE  HCl (IBUPROFEN PM) 200-25 MG CAPS Take 2 tablets by mouth at bedtime as needed (sleep).   Yes [provider]  LORazepam (ATIVAN) 0.5 MG tablet Take by mouth. 09/04/23  Yes [provider]  losartan  (COZAAR ) 100 MG tablet Take 100 mg by mouth daily. 07/15/19  Yes [provider]  magic mouthwash (multi-ingredient) oral suspension Swish and swallow 5-10 mLs 4 (four) times daily as needed. 02/26/24  Yes Jacobo Evalene PARAS, MD  magic mouthwash w/lidocaine  SOLN Take 5 mLs by mouth 4 (four) times daily. Suspension contains equal amounts of Maalox Extra Strength, nystatin, diphenhydramine  and lidocaine . 03/07/24  Yes Borders, Joshua R, NP  magnesium oxide (MAG-OX) 400 (240 Mg) MG tablet Take 400 mg by mouth daily.   Yes [provider]  methylPREDNISolone  (MEDROL  DOSEPAK) 4 MG TBPK tablet Start with 6 tablets for 1st day and then reduce by one tablet daily until completed. 02/11/24  Yes Borders, Fonda SAUNDERS, NP  metoprolol  succinate (TOPROL -XL) 50 MG 24 hr tablet Take 50 mg by mouth daily. 06/24/19  Yes [provider]  omeprazole (PRILOSEC) 40 MG capsule Take 40 mg by mouth daily as needed.   Yes [provider]  ondansetron  (ZOFRAN ) 8 MG tablet Take 1 tablet (8 mg total) by mouth every 8 (eight) hours as needed for nausea or vomiting. 01/08/24  Yes Jacobo Evalene PARAS, MD  oxyCODONE  (OXY IR/ROXICODONE ) 5 MG immediate release tablet Take 1-2 tablets (5-10 mg total) by mouth every 4 (four) hours as needed for severe pain (pain score 7-10). 02/22/24  Yes Jacobo Evalene PARAS, MD  pregabalin (LYRICA) 25 MG capsule Take 1 capsule (25 mg total) by mouth 2 (two) times daily. 03/16/24  Yes Borders, Fonda SAUNDERS, NP  prochlorperazine  (COMPAZINE ) 10 MG tablet TAKE ONE TABLET BY MOUTH EVERY 6 HOURS AS NEEDED FOR NAUSEA / VOMITING 09/24/23  Yes Leonard, Alyson N, RPH-CPP  sodium chloride  1 g tablet Take 1 tablet (1 g total) by mouth 3 (three) times daily with meals. 03/16/24  Yes Jacobo Evalene PARAS, MD  sucralfate (CARAFATE) 1 g tablet Take 1 tablet (1 g total) by mouth 3 (three) times daily before meals. Dissolve tablet in 4 T warm water swish and swallow 30 minutes before meals. 03/16/24 04/15/24 Yes Borders, Fonda SAUNDERS, NP  Tetrahydrozoline HCl (VISINE OP) Place 1 drop into both eyes daily as needed (dry eyes).   Yes [provider]  vitamin B-12 (CYANOCOBALAMIN ) 1000 MCG tablet Take 1,000 mcg by mouth daily.   Yes [provider]  OVER THE COUNTER MEDICATION Take 1 capsule by mouth daily. CBD gummie    [provider]     Critical care time: 37 minutes      Lonell Moose, AGNP  Pulmonary/Critical Care Pager [phone] (please enter 7 digits) PCCM Consult Pager [phone] (please enter  7 digits)

## 2024-03-21 NOTE — Progress Notes (Addendum)
 [facility] CLINIC CARDIOLOGY PROGRESS NOTE   Patient ID: Hannah Yu MRN: [id number] DOB/AGE: [birth year] 78 y.o.  Admit date: 03/18/2024 Referring Physician Dr. Isadora Primary Physician Epifanio Alm SQUIBB, MD  Primary Cardiologist None Reason for Consultation PE, RV dysfunction  HPI: Hannah Yu is a 78 y.o. 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  Found to have acute segmental right PE, no evidence of right heart strain on CT.  Echo did reveal RV dysfunction.  Cardiology was consulted for further evaluation.   Interval History:  - Patient seen and examined this morning, resting in hospital bed with family at bedside. - She states that she feels she is breathing better today.   - She denies any chest pain or palpitation symptoms.  Heart rate is elevated on telemetry, atrial fibrillation.  Review of systems complete and found to be negative unless listed above   Vitals:   03/21/24 0600 03/21/24 0710 03/21/24 0800 03/21/24 0945  BP: 116/73   118/74  Pulse: (!) 36   (!) 157  Resp: 19     Temp:   (!) 100.4 F (38 C)   TempSrc:   Axillary   SpO2: 97% 98%    Weight:      Height:         Intake/Output Summary (Last 24 hours) at 03/21/2024 1038 Last data filed at 03/21/2024 0815 Gross per 24 hour  Intake 941.55 ml  Output 1040 ml  Net -98.45 ml     PHYSICAL EXAM General: Chronically ill-appearing elderly female, well nourished, in no acute distress. HEENT: Normocephalic and atraumatic. Neck: No JVD.  Lungs: Normal respiratory effort on HFNC. Clear bilaterally to auscultation. No wheezes, crackles, rhonchi.  Heart: Irregularly irregular, elevated rate. Normal S1 and S2  without gallops or murmurs. Radial & DP pulses 2+ bilaterally. Abdomen: Non-distended appearing.  Msk: Normal strength and tone for age. Extremities: No clubbing, cyanosis or edema.   Neuro: Alert and oriented X 3. Psych: Mood appropriate, affect congruent.    LABS: Basic Metabolic Panel: Recent Labs    03/19/24 0326 03/20/24 0624 03/21/24 0451  NA 120* 129* 129*  K 4.6 4.2 4.4  CL 87* 92* 95*  CO2 20* 22 25  GLUCOSE 165* 103* 112*  BUN 37* 25* 17  CREATININE 1.14* 0.71 0.63  CALCIUM 7.7* 8.5* 8.5*  MG 2.0 2.1  --   PHOS 4.5 3.6  --    Liver Function Tests: Recent Labs    03/18/24 1850 03/21/24 0451  AST 54* 26  ALT 30 27  ALKPHOS 228* 240*  BILITOT 0.3 0.2  PROT 6.7 5.8*  ALBUMIN  3.0* 2.7*   No results for input(s): LIPASE, AMYLASE in the last 72 hours. CBC: Recent Labs    03/18/24 1850 03/19/24 0326 03/20/24 0624 03/21/24 0451  WBC 27.3*   < > 20.6* 15.1*  NEUTROABS 24.0*  --   --   --   HGB 9.9*   < > 9.1* 8.0*  HCT 30.7*   < > 28.3* 26.2*  MCV 76.2*   < > 77.7* 79.6*  PLT 465*   < > 457* 446*   < > = values in this interval not displayed.   Cardiac Enzymes: No results for input(s): CKTOTAL,  CKMB, CKMBINDEX, TROPONINIHS in the last 72 hours. BNP: No results for input(s): BNP in the last 72 hours. D-Dimer: No results for input(s): DDIMER in the last 72 hours. Hemoglobin A1C: No results for input(s): HGBA1C in the last 72 hours. Fasting Lipid Panel: No results for input(s): CHOL, HDL, LDLCALC, TRIG, CHOLHDL, LDLDIRECT in the last 72 hours. Thyroid  Function Tests: No results for input(s): TSH, T4TOTAL, T3FREE, THYROIDAB in the last 72 hours.  Invalid input(s): FREET3 Anemia Panel: No results for input(s): VITAMINB12, FOLATE, FERRITIN, TIBC, IRON, RETICCTPCT in the last 72 hours.  DG Chest Port 1 View Result Date: 03/21/2024 EXAM: 1 VIEW(S) XRAY OF THE CHEST 03/21/2024 09:05:00 AM COMPARISON:  03/18/2024 CLINICAL HISTORY: Acute hypoxic respiratory failure (HCC) 8228802. FINDINGS: LINES, TUBES AND DEVICES: Stable right IJ port catheter to the cavoatrial junction. LUNGS AND PLEURA: Worsening perihilar opacities. Worsening and left retrocardiac consolidation/atelectasis. Worsening linear perihilar opacities. Probable small left pleural effusion. No pneumothorax. HEART AND MEDIASTINUM: Aortic atherosclerosis (ICD-10: I70.0). BONES AND SOFT TISSUES: No acute osseous abnormality. IMPRESSION: 1. Worsening perihilar opacities and left retrocardiac consolidation/atelectasis. 2. Probable small left pleural effusion. Electronically signed by: Katheleen Faes MD 03/21/2024 09:59 AM EST RP Workstation: [id number]   DG HIP UNILAT WITH PELVIS 2-3 VIEWS LEFT Result Date: 03/20/2024 CLINICAL DATA:  Pain with recent fall. EXAM: DG HIP (WITH OR WITHOUT PELVIS) 2-3V LEFT COMPARISON:  12/17/2020, 12/21/2023. FINDINGS: Increased lucency is noted at the acetabulum on the left, concerning for metastatic disease and seen on prior PET-CT. There are cortical irregularities involving the medial aspect of the pelvic rim at the acetabulum and inferior pubic ramus, which may be due to metastatic disease. The possibility of superimposed in fracture cannot be excluded. 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 There degenerative changes in the lower lumbar spine. IMPRESSION: 1. Increased lucency a at the acetabulum on the left, likely related to known metastatic disease. Cortical irregularities are also seen along the pelvic rim at the acetabulum on the left and inferior pubic ramus, raising the possibility for superimposed fracture. 2. Total hip arthroplasty changes bilaterally. Electronically Signed   By: Leita Birmingham M.D.   On: 03/20/2024 17:05   ECHOCARDIOGRAM COMPLETE Result Date: 03/19/2024    ECHOCARDIOGRAM REPORT   Patient Name:   Hannah Yu Date of Exam: 03/19/2024 Medical Rec #:  [id number]            Height:       60.0 in Accession #:    [id number]          Weight:       172.4 lb Date of Birth:  05/22/1945            BSA:          1.752 m Patient Age:    78 years            BP:           109/78 mmHg Patient Gender: F                   HR:           94 bpm. Exam Location:  [facility] Procedure: 2D Echo, Color Doppler and Cardiac Doppler (Both Spectral and Color            Flow Doppler were utilized during procedure). STAT ECHO Indications:     Dyspnea R06.00  History:         Patient has prior history of Echocardiogram examinations, most  recent 09/09/2019.  Sonographer:     Thedora Louder RDCS, FASE Referring Phys:  [id number] KHABIB DGAYLI Diagnosing Phys: Annabella Scarce MD IMPRESSIONS  1. Compared with the echo in 2021, right ventricular dilation and dysfunction are new. Consistent with known finding of pulmonary embolis.  2. Left ventricular ejection fraction, by estimation, is 60 to 65%. The left ventricle has normal function. The left ventricle has no regional wall motion abnormalities. Left ventricular diastolic parameters are consistent with Grade I diastolic dysfunction (impaired relaxation).  3. Right ventricular systolic function is moderately reduced. The right ventricular size is moderately enlarged. There is severely elevated pulmonary artery systolic pressure.  4. Right atrial size was severely dilated.  5. The mitral valve is normal in structure. No evidence of mitral valve regurgitation. No evidence of mitral stenosis.  6. 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  7. The aortic valve is tricuspid. Aortic valve regurgitation is not visualized. No aortic stenosis is present.  8. The inferior vena cava is dilated in size with <50% respiratory variability, suggesting right atrial pressure of 15 mmHg. FINDINGS  Left Ventricle: Left ventricular ejection fraction, by estimation, is 60 to 65%. The left ventricle has normal function. The left ventricle has no regional wall motion abnormalities.  The left ventricular internal cavity size was normal in size. There is  no left ventricular hypertrophy. Left ventricular diastolic parameters are consistent with Grade I diastolic dysfunction (impaired relaxation). Normal left ventricular filling pressure. Right Ventricle: The right ventricular size is moderately enlarged. No increase in right ventricular wall thickness. Right ventricular systolic function is moderately reduced. There is severely elevated pulmonary artery systolic pressure. The tricuspid regurgitant velocity is 3.46 m/s, and with an assumed right atrial pressure of 15 mmHg, the estimated right ventricular systolic pressure is 62.9 mmHg. Left Atrium: Left atrial size was normal in size. Right Atrium: Right atrial size was severely dilated. Pericardium: There is no evidence of pericardial effusion. Mitral Valve: The mitral valve is normal in structure. No evidence of mitral valve regurgitation. No evidence of mitral valve stenosis. Tricuspid Valve: The tricuspid valve is normal in structure. 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 No evidence of tricuspid stenosis. Aortic Valve: The aortic valve is tricuspid. Aortic valve regurgitation is not visualized. No aortic stenosis is present. Aortic valve peak gradient measures 6.9 mmHg. Pulmonic Valve: The pulmonic valve was normal in structure. Pulmonic valve regurgitation is not visualized. No evidence of pulmonic stenosis. Aorta: The aortic root is normal in size and structure. Venous: The inferior vena cava is dilated in size with less than 50% respiratory variability, suggesting right atrial pressure of 15 mmHg. IAS/Shunts: No atrial level shunt detected by color flow Doppler.  LEFT VENTRICLE PLAX 2D LVIDd:         2.90 cm   Diastology LVIDs:         2.10 cm   LV e' medial:    10.00 cm/s LV IVS:        1.00 cm   LV E/e' medial:  6.9 LVOT diam:     1.90 cm   LV e' lateral:   13.60 cm/s LV SV:         30        LV E/e' lateral: 5.1 LV SV Index:   17  LVOT Area:     2.84 cm  RIGHT VENTRICLE RV Basal diam:  3.40 cm RV S prime:     9.46 cm/s TAPSE (M-mode): 1.6 cm LEFT ATRIUM  Index        RIGHT ATRIUM           Index LA Vol (A4C): 40.2 ml 22.94 ml/m  RA Area:     23.80 cm                                    RA Volume:   75.20 ml  42.91 ml/m  AORTIC VALVE                 PULMONIC VALVE AV Area (Vmax): 1.31 cm     PV Vmax:        0.76 m/s AV Vmax:        131.00 cm/s  PV Peak grad:   2.3 mmHg AV Peak Grad:   6.9 mmHg     RVOT Peak grad: 1 mmHg LVOT Vmax:      60.70 cm/s LVOT Vmean:     38.700 cm/s LVOT VTI:       0.105 m  AORTA Ao Root diam: 3.00 cm Ao Asc diam:  3.30 cm MITRAL VALVE               TRICUSPID VALVE MV Area (PHT): 2.24 cm    TR Peak grad:   47.9 mmHg MV Decel Time: 338 msec    TR Vmax:        346.00 cm/s MV E velocity: 68.70 cm/s MV A velocity: 98.00 cm/s  SHUNTS MV E/A ratio:  0.70        Systemic VTI:  0.10 m                            Systemic Diam: 1.90 cm Annabella Scarce MD Electronically signed by Annabella Scarce MD Signature Date/Time: 03/19/2024/11:15:10 AM    Final (Updated)      ECHO as above  TELEMETRY (personally reviewed): Atrial fibrillation rate 120s  EKG (personally reviewed): Atrial fibrillation rate 126 bpm  DATA reviewed by me 03/21/24: last 24h vitals tele labs imaging I/O, admission H&P, PCCM notes, palliative notes  Principal Problem:   Acute saddle pulmonary embolism (HCC) Active Problems:   Acute respiratory failure with hypoxia (HCC)   Acute pulmonary embolism without acute cor pulmonale (HCC)   Severe sepsis (HCC)   Mild pulmonic regurgitation and RV dysfunction by prior echocardiogram   AKI (acute kidney injury)    ASSESSMENT AND PLAN: LUCILL MAUCK is a 78 y.o. 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  Found to have acute segmental right PE, no evidence of right heart strain on CT.  Echo did reveal RV dysfunction.  Cardiology was consulted for further evaluation.   # Acute pulmonary emboli # Metastatic adenocarcinoma of lung # COPD # RV dysfunction # Atrial fibrillation RVR Patient presented with worsening shortness of breath, found to have pulmonary emboli without evidence of right heart strain on CTA.  Echo this admission with EF 60 to 65%, no WMA's, grade 1 diastolic dysfunction, moderate RV enlargement with moderately reduced function, severely elevated PASP, severe right atrial dilation, severe TR. - Will start IV amiodarone bolus and infusion for rate control.  Also starting low-dose metoprolol  to tartrate 12.5 mg twice daily. - Continue IV heparin  for anticoagulation. - Would not recommend pursuing right heart catheterization at this time.  Doubtful that this would provide any additional information that would overall change course of management. - Palliative medicine has been consulted.  Patient is critically ill with acute PE in setting of underlying metastatic lung cancer.  Prognosis is guarded.  This patient's case was discussed and created with Dr. Florencio and he is in agreement.  Signed:  Danita Bloch, PA-C  03/21/2024, 10:38 AM [facility] Cardiology

## 2024-03-21 NOTE — TOC Initial Note (Signed)
 Transition of Care [facility]) - Initial/Assessment Note    Patient Details  Name: Hannah Yu MRN: [id number] Date of Birth: 09-13-1945  [facility] CM/SW Contact:    Corrie JINNY Ruts, LCSW Phone Number: 03/21/2024, 1:30 PM  Clinical Narrative:                 Chart reviewed. The patient was admitted for acute saddle pulmonary embolism. I was able to speak with the patient and the patient health care POA at bedside today. I introduced myself, my role, and reason for consult. The patient reports that she is doing well today. The patient POA was able to complete the consult for the patient. The patient POA reports that the patient lives with her brother. The patient POA reports that the patient has lung cancer so the patient was not able to complete daily living task independently.   The patient POA reports that she drove the patient to medical appointments. The patient POA reports that she will assist the patient at D/C.The patient POA reports [facility]. The patient POA reports that the patient has never had HH or been admitted into a SNF in the past. The patient POA reports that the patient has a cane and walker in the home.   The patient and POA did not have any concerns or questions during the time of the assessment.         Patient Goals and CMS Choice            Expected Discharge Plan and Services                                              Prior Living Arrangements/Services                       Activities of Daily Living   ADL Screening (condition at time of admission) Independently performs ADLs?: Yes (appropriate for developmental age) Is the patient deaf or have difficulty hearing?: No Does the patient have difficulty seeing, even when wearing glasses/contacts?: No Does the patient have difficulty concentrating, remembering, or making decisions?: No  Permission Sought/Granted                   Emotional Assessment              Admission diagnosis:  Acute respiratory failure with hypoxia (HCC) [J96.01] Severe sepsis (HCC) [A41.9, R65.20] Acute saddle pulmonary embolism (HCC) [I26.92] Acute pulmonary embolism without acute cor pulmonale, unspecified pulmonary embolism type (HCC) [I26.99] Patient Active Problem List   Diagnosis Date Noted   Palliative care encounter 03/21/2024   Acute respiratory failure with hypoxia (HCC) 03/19/2024   Acute pulmonary embolism without acute cor pulmonale (HCC) 03/19/2024   Severe sepsis (HCC) 03/19/2024   Mild pulmonic regurgitation and RV dysfunction by prior echocardiogram 03/19/2024   AKI (acute kidney injury) 03/19/2024   Acute saddle pulmonary embolism (HCC) 03/18/2024   Adenocarcinoma of left lung (HCC) 03/19/2023   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   Unilateral primary osteoarthritis, left hip 04/25/2020   Status post total replacement of right hip 09/06/2019   Pelvic pain in female 07/20/2019   Hyperlipidemia 07/20/2019   B12 deficiency 07/20/2019   Neuropathic pain of left hand 06/23/2017   Spinal stenosis of lumbosacral region 11/11/2016  Primary osteoarthritis of right hip 11/11/2016   Obesity (BMI 30.0-34.9) 09/12/2016   H/O supraventricular tachycardia 01/14/2016   Hypertension 12/24/2015   Back pain with right-sided sciatica 12/24/2015   Gallstone 10/19/2015   Biliary colic 10/19/2015   Diverticulitis 06/28/2014   PCP:  Epifanio Alm SQUIBB, MD Pharmacy:   [facility] - [place], [place] - [address] [place] [address] Phone: [phone] Fax: [phone]     Social Drivers of Health (SDOH) Social History: SDOH Screenings   Food Insecurity: No Food Insecurity (03/20/2024)  Housing: Low Risk  (03/20/2024)  Transportation Needs: No Transportation Needs (03/20/2024)  Utilities: Not At Risk (03/20/2024)  Depression (PHQ2-9): Low Risk  (02/24/2024)   Financial Resource Strain: Low Risk  (12/08/2023)   Received from [facility]  Social Connections: Moderately Isolated (03/20/2024)  Tobacco Use: Medium Risk (03/18/2024)   SDOH Interventions: Housing Interventions: Patient Declined Social Connections Interventions: Patient Declined   Readmission Risk Interventions     No data to display

## 2024-03-21 NOTE — Consult Note (Signed)
 Palliative Medicine [facility] at [facility] [phone] [phone]   Name: Hannah Yu Date: 03/21/2024 MRN: [id number]  DOB: June 23, 1945  Patient Care Team: Epifanio Alm SQUIBB, MD as PCP - General (Infectious Diseases) Verdene Gills, RN as Oncology Nurse Navigator Lenn Aran, MD as Consulting Physician (Radiation Oncology) Jacobo Evalene PARAS, MD as Consulting Physician (Oncology)    REASON FOR CONSULTATION: Hannah Yu is a 78 y.o. female with multiple medical problems including stage IVb adenocarcinoma of the lung on treatment with CarboTaxol plus Bev.  Patient mid to the hospital with respiratory failure.  Found to have PE and right heart dysfunction on echo. Palliative care was consulted to address goals.   SOCIAL HISTORY:     reports that she quit smoking about 31 years ago. Her smoking use included cigarettes. She has never used smokeless tobacco. She reports current alcohol use of about 4.0 standard drinks of alcohol per week. She reports that she does not use drugs.  Patient is unmarried.  Has no children.  Lives at home with her brother.  Patient previously worked in office.   ADVANCE DIRECTIVES:  Not on file  CODE STATUS: DNR  PAST MEDICAL HISTORY: Past Medical History:  Diagnosis Date   Acid reflux    Anemia    Anesthesia complication    Tachycardia previously, now on metoprolol    Arthritis    09/02/2019: per patient have it real bad in both hands and back   Difficult intubation    had to terminate intubation unable to advance tube for cholecystectomy 2017?   DVT (deep venous thrombosis) (HCC)    leg   Hypertension    Sciatica    09/02/2019: has had it for about 3 years    PAST SURGICAL HISTORY:  Past Surgical History:  Procedure Laterality Date   ABDOMINAL HYSTERECTOMY     CATARACT EXTRACTION W/ INTRAOCULAR LENS IMPLANT Bilateral 2007   eyes done within 1 month of each other.   IR  IMAGING GUIDED PORT INSERTION  05/19/2023   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   TOTAL HIP ARTHROPLASTY Right 09/06/2019   Procedure: RIGHT TOTAL HIP ARTHROPLASTY ANTERIOR APPROACH;  Surgeon: Vernetta Lonni GRADE, MD;  Location: [facility] OR;  Service: Orthopedics;  Laterality: Right;   TOTAL HIP ARTHROPLASTY Left 07/31/2020   Procedure: LEFT TOTAL HIP ARTHROPLASTY ANTERIOR APPROACH;  Surgeon: Vernetta Lonni GRADE, MD;  Location: [facility] OR;  Service: Orthopedics;  Laterality: Left;   VIDEO BRONCHOSCOPY WITH ENDOBRONCHIAL ULTRASOUND N/A 10/06/2022   Procedure: VIDEO BRONCHOSCOPY WITH ENDOBRONCHIAL ULTRASOUND;  Surgeon: Parris Manna, MD;  Location: [facility] ORS;  Service: Thoracic;  Laterality: N/A;    HEMATOLOGY/ONCOLOGY HISTORY:  Oncology History  Adenocarcinoma of left lung (HCC)  03/19/2023 Initial Diagnosis   Adenocarcinoma of left lung (HCC)   03/19/2023 Cancer Staging   Staging form: Lung, AJCC 8th Edition - Clinical stage from 03/19/2023: Stage IVB (cTX, cN2, cM1c) - Signed by Jacobo Evalene PARAS, MD on 03/19/2023 Stage prefix: Initial diagnosis   05/07/2023 - 07/16/2023 Chemotherapy   Patient is on Treatment Plan : LUNG NSCLC Pembrolizumab  (200) q21d     02/03/2024 -  Chemotherapy   Patient is on Treatment Plan : LUNG NSCLC Carboplatin + Paclitaxel + Bevacizumab q21d       ALLERGIES:  is allergic to ezetimibe, codeine, cortisone, and lovastatin.  MEDICATIONS:  Current Facility-Administered Medications  Medication Dose Route Frequency Provider Last  Rate Last Admin   alum & mag hydroxide-simeth (MAALOX/MYLANTA) 200-200-20 MG/5ML suspension 15 mL  15 mL Oral Q6H PRN Nelson, Dana G, NP   15 mL at 03/21/24 9060   amiodarone (NEXTERONE PREMIX) 360-4.14 MG/200ML-% (1.8 mg/mL) IV infusion  60 mg/hr Intravenous Continuous Hudson, Caralyn, PA-C 33.3 mL/hr at 03/21/24 1309 60 mg/hr at 03/21/24 1309   Followed by   amiodarone  (NEXTERONE PREMIX) 360-4.14 MG/200ML-% (1.8 mg/mL) IV infusion  30 mg/hr Intravenous Continuous Hudson, Caralyn, PA-C       azithromycin (ZITHROMAX) 500 mg in sodium chloride  0.9 % 250 mL IVPB  500 mg Intravenous Q24H Isadora Hose, MD   Stopped at 03/20/24 1841   cefTRIAXone (ROCEPHIN) 2 g in sodium chloride  0.9 % 100 mL IVPB  2 g Intravenous Q24H Isadora Hose, MD 200 mL/hr at 03/21/24 1305 2 g at 03/21/24 1305   Chlorhexidine  Gluconate Cloth 2 % PADS 6 each  6 each Topical Daily Kathrene Almarie Bake, NP   6 each at 03/21/24 9056   docusate sodium  (COLACE) capsule 100 mg  100 mg Oral BID PRN Kathrene Almarie Bake, NP   100 mg at 03/21/24 9060   DULoxetine (CYMBALTA) DR capsule 30 mg  30 mg Oral Daily Isadora Hose, MD   30 mg at 03/21/24 9060   feeding supplement (BOOST / RESOURCE BREEZE) liquid 1 Container  1 Container Oral TID BM Isadora Hose, MD   1 Container at 03/21/24 1306   heparin  ADULT infusion 100 units/mL (25000 units/250mL)  1,550 Units/hr Intravenous Continuous Dail Rankin RAMAN, RPH 15.5 mL/hr at 03/21/24 1200 1,550 Units/hr at 03/21/24 1200   magic mouthwash w/lidocaine   5 mL Oral QID Isadora Hose, MD   5 mL at 03/21/24 1306   metoprolol  tartrate (LOPRESSOR ) tablet 12.5 mg  12.5 mg Oral BID Hudson, Caralyn, PA-C   12.5 mg at 03/21/24 0945   oxyCODONE  (Oxy IR/ROXICODONE ) immediate release tablet 5-10 mg  5-10 mg Oral Q4H PRN Kathrene Almarie Bake, NP   10 mg at 03/20/24 1117   polyethylene glycol (MIRALAX / GLYCOLAX) packet 17 g  17 g Oral Daily PRN Kathrene Almarie Bake, NP   17 g at 03/21/24 0807   simethicone (MYLICON) chewable tablet 80 mg  80 mg Oral QID PRN Bousman, Karlie, PA-C   80 mg at 03/21/24 0939    VITAL SIGNS: BP 102/63 (BP Location: Left Arm)   Pulse (!) 126   Temp 100.2 F (37.9 C) (Axillary)   Resp 17   Ht 5' (1.524 m)   Wt 172 lb 6.4 oz (78.2 kg)   SpO2 96%   BMI 33.67 kg/m  Filed Weights   03/19/24 0500 03/20/24 0500 03/21/24 0500  Weight:  172 lb 6.4 oz (78.2 kg) 149 lb 0.5 oz (67.6 kg) 172 lb 6.4 oz (78.2 kg)    Estimated body mass index is 33.67 kg/m as calculated from the following:   Height as of this encounter: 5' (1.524 m).   Weight as of this encounter: 172 lb 6.4 oz (78.2 kg).  LABS: CBC:    Component Value Date/Time   WBC 15.1 (H) 03/21/2024 0451   HGB 8.0 (L) 03/21/2024 0451   HGB 10.8 (L) 03/16/2024 0830   HCT 26.2 (L) 03/21/2024 0451   PLT 446 (H) 03/21/2024 0451   PLT 411 (H) 03/16/2024 0830   MCV 79.6 (L) 03/21/2024 0451   NEUTROABS 24.0 (H) 03/18/2024 1850   LYMPHSABS 0.6 (L) 03/18/2024 1850   MONOABS 1.6 (H) 03/18/2024 1850  EOSABS 0.0 03/18/2024 1850   BASOSABS 0.1 03/18/2024 1850   Comprehensive Metabolic Panel:    Component Value Date/Time   NA 129 (L) 03/21/2024 0451   K 4.4 03/21/2024 0451   CL 95 (L) 03/21/2024 0451   CO2 25 03/21/2024 0451   BUN 17 03/21/2024 0451   CREATININE 0.63 03/21/2024 0451   CREATININE 0.69 03/16/2024 0830   GLUCOSE 112 (H) 03/21/2024 0451   CALCIUM 8.5 (L) 03/21/2024 0451   AST 26 03/21/2024 0451   AST 24 03/16/2024 0830   ALT 27 03/21/2024 0451   ALT 19 03/16/2024 0830   ALKPHOS 240 (H) 03/21/2024 0451   BILITOT 0.2 03/21/2024 0451   BILITOT 0.4 03/16/2024 0830   PROT 5.8 (L) 03/21/2024 0451   ALBUMIN  2.7 (L) 03/21/2024 0451    RADIOGRAPHIC STUDIES: DG Chest Port 1 View Result Date: 03/21/2024 EXAM: 1 VIEW(S) XRAY OF THE CHEST 03/21/2024 09:05:00 AM COMPARISON: 03/18/2024 CLINICAL HISTORY: Acute hypoxic respiratory failure (HCC) 8228802. FINDINGS: LINES, TUBES AND DEVICES: Stable right IJ port catheter to the cavoatrial junction. LUNGS AND PLEURA: Worsening perihilar opacities. Worsening and left retrocardiac consolidation/atelectasis. Worsening linear perihilar opacities. Probable small left pleural effusion. No pneumothorax. HEART AND MEDIASTINUM: Aortic atherosclerosis (ICD-10: I70.0). BONES AND SOFT TISSUES: No acute osseous abnormality.  IMPRESSION: 1. Worsening perihilar opacities and left retrocardiac consolidation/atelectasis. 2. Probable small left pleural effusion. Electronically signed by: Katheleen Faes MD 03/21/2024 09:59 AM EST RP Workstation: [id number]   DG HIP UNILAT WITH PELVIS 2-3 VIEWS LEFT Result Date: 03/20/2024 CLINICAL DATA:  Pain with recent fall. EXAM: DG HIP (WITH OR WITHOUT PELVIS) 2-3V LEFT COMPARISON:  12/17/2020, 12/21/2023. FINDINGS: Increased lucency is noted at the acetabulum on the left, concerning for metastatic disease and seen on prior PET-CT. There are cortical irregularities involving the medial aspect of the pelvic rim at the acetabulum and inferior pubic ramus, which may be due to metastatic disease. The possibility of superimposed in fracture cannot be excluded. 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 There degenerative changes in the lower lumbar spine. IMPRESSION: 1. Increased lucency a at the acetabulum on the left, likely related to known metastatic disease. Cortical irregularities are also seen along the pelvic rim at the acetabulum on the left and inferior pubic ramus, raising the possibility for superimposed fracture. 2. Total hip arthroplasty changes bilaterally. Electronically Signed   By: Leita Birmingham M.D.   On: 03/20/2024 17:05   ECHOCARDIOGRAM COMPLETE Result Date: 03/19/2024    ECHOCARDIOGRAM REPORT   Patient Name:   ABYGALE KARPF Date of Exam: 03/19/2024 Medical Rec #:  [id number]           Height:       60.0 in Accession #:    [id number]          Weight:       172.4 lb Date of Birth:  [birth year]            BSA:          1.752 m Patient Age:    78 years            BP:           109/78 mmHg Patient Gender: F                   HR:           94 bpm. Exam Location:  [facility] Procedure: 2D Echo, Color Doppler and Cardiac Doppler (Both Spectral and Color  Flow Doppler were utilized during procedure). STAT ECHO Indications:     Dyspnea R06.00  History:         Patient has  prior history of Echocardiogram examinations, most                  recent 09/09/2019.  Sonographer:     Thedora Louder RDCS, FASE Referring Phys:  [id number] KHABIB DGAYLI Diagnosing Phys: Annabella Scarce MD IMPRESSIONS  1. Compared with the echo in 2021, right ventricular dilation and dysfunction are new. Consistent with known finding of pulmonary embolis.  2. Left ventricular ejection fraction, by estimation, is 60 to 65%. The left ventricle has normal function. The left ventricle has no regional wall motion abnormalities. Left ventricular diastolic parameters are consistent with Grade I diastolic dysfunction (impaired relaxation).  3. Right ventricular systolic function is moderately reduced. The right ventricular size is moderately enlarged. There is severely elevated pulmonary artery systolic pressure.  4. Right atrial size was severely dilated.  5. The mitral valve is normal in structure. No evidence of mitral valve regurgitation. No evidence of mitral stenosis.  6. 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  7. The aortic valve is tricuspid. Aortic valve regurgitation is not visualized. No aortic stenosis is present.  8. The inferior vena cava is dilated in size with <50% respiratory variability, suggesting right atrial pressure of 15 mmHg. FINDINGS  Left Ventricle: Left ventricular ejection fraction, by estimation, is 60 to 65%. The left ventricle has normal function. The left ventricle has no regional wall motion abnormalities. The left ventricular internal cavity size was normal in size. There is  no left ventricular hypertrophy. Left ventricular diastolic parameters are consistent with Grade I diastolic dysfunction (impaired relaxation). Normal left ventricular filling pressure. Right Ventricle: The right ventricular size is moderately enlarged. No increase in right ventricular wall thickness. Right ventricular systolic function is moderately reduced. There is severely elevated pulmonary artery systolic  pressure. The tricuspid regurgitant velocity is 3.46 m/s, and with an assumed right atrial pressure of 15 mmHg, the estimated right ventricular systolic pressure is 62.9 mmHg. Left Atrium: Left atrial size was normal in size. Right Atrium: Right atrial size was severely dilated. Pericardium: There is no evidence of pericardial effusion. Mitral Valve: The mitral valve is normal in structure. No evidence of mitral valve regurgitation. No evidence of mitral valve stenosis. Tricuspid Valve: The tricuspid valve is normal in structure. 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 No evidence of tricuspid stenosis. Aortic Valve: The aortic valve is tricuspid. Aortic valve regurgitation is not visualized. No aortic stenosis is present. Aortic valve peak gradient measures 6.9 mmHg. Pulmonic Valve: The pulmonic valve was normal in structure. Pulmonic valve regurgitation is not visualized. No evidence of pulmonic stenosis. Aorta: The aortic root is normal in size and structure. Venous: The inferior vena cava is dilated in size with less than 50% respiratory variability, suggesting right atrial pressure of 15 mmHg. IAS/Shunts: No atrial level shunt detected by color flow Doppler.  LEFT VENTRICLE PLAX 2D LVIDd:         2.90 cm   Diastology LVIDs:         2.10 cm   LV e' medial:    10.00 cm/s LV IVS:        1.00 cm   LV E/e' medial:  6.9 LVOT diam:     1.90 cm   LV e' lateral:   13.60 cm/s LV SV:         30  LV E/e' lateral: 5.1 LV SV Index:   17 LVOT Area:     2.84 cm  RIGHT VENTRICLE RV Basal diam:  3.40 cm RV S prime:     9.46 cm/s TAPSE (M-mode): 1.6 cm LEFT ATRIUM           Index        RIGHT ATRIUM           Index LA Vol (A4C): 40.2 ml 22.94 ml/m  RA Area:     23.80 cm                                    RA Volume:   75.20 ml  42.91 ml/m  AORTIC VALVE                 PULMONIC VALVE AV Area (Vmax): 1.31 cm     PV Vmax:        0.76 m/s AV Vmax:        131.00 cm/s  PV Peak grad:   2.3 mmHg AV Peak Grad:   6.9 mmHg      RVOT Peak grad: 1 mmHg LVOT Vmax:      60.70 cm/s LVOT Vmean:     38.700 cm/s LVOT VTI:       0.105 m  AORTA Ao Root diam: 3.00 cm Ao Asc diam:  3.30 cm MITRAL VALVE               TRICUSPID VALVE MV Area (PHT): 2.24 cm    TR Peak grad:   47.9 mmHg MV Decel Time: 338 msec    TR Vmax:        346.00 cm/s MV E velocity: 68.70 cm/s MV A velocity: 98.00 cm/s  SHUNTS MV E/A ratio:  0.70        Systemic VTI:  0.10 m                            Systemic Diam: 1.90 cm Annabella Scarce MD Electronically signed by Annabella Scarce MD Signature Date/Time: 03/19/2024/11:15:10 AM    Final (Updated)    US  Venous Img Lower Bilateral (DVT) Result Date: 03/19/2024 CLINICAL DATA:  Pulmonary embolism. EXAM: BILATERAL LOWER EXTREMITY VENOUS DOPPLER ULTRASOUND TECHNIQUE: Gray-scale sonography with graded compression, as well as color Doppler and duplex ultrasound were performed to evaluate the lower extremity deep venous systems from the level of the common femoral vein and including the common femoral, femoral, profunda femoral, popliteal and calf veins including the posterior tibial, peroneal and gastrocnemius veins when visible. The superficial great saphenous vein was also interrogated. Spectral Doppler was utilized to evaluate flow at rest and with distal augmentation maneuvers in the common femoral, femoral and popliteal veins. COMPARISON:  None Available. FINDINGS: RIGHT LOWER EXTREMITY Common Femoral Vein: No evidence of thrombus. Normal compressibility, respiratory phasicity and response to augmentation. Saphenofemoral Junction: No evidence of thrombus. Normal compressibility and flow on color Doppler imaging. Profunda Femoral Vein: No evidence of thrombus. Normal compressibility and flow on color Doppler imaging. Femoral Vein: No evidence of thrombus. Normal compressibility, respiratory phasicity and response to augmentation. Popliteal Vein: No evidence of thrombus. Normal compressibility, respiratory phasicity and response  to augmentation. Calf Veins: Suggestion of nonocclusive thrombus in the visualized right posterior tibial and peroneal veins. Superficial Great Saphenous Vein: No evidence of thrombus. Normal compressibility. Venous Reflux:  None. Other Findings:  No evidence of superficial thrombophlebitis or abnormal fluid collection. LEFT LOWER EXTREMITY Common Femoral Vein: No evidence of thrombus. Normal compressibility, respiratory phasicity and response to augmentation. Saphenofemoral Junction: No evidence of thrombus. Normal compressibility and flow on color Doppler imaging. Profunda Femoral Vein: No evidence of thrombus. Normal compressibility and flow on color Doppler imaging. Femoral Vein: No evidence of thrombus. Normal compressibility, respiratory phasicity and response to augmentation. Popliteal Vein: No evidence of thrombus. Normal compressibility, respiratory phasicity and response to augmentation. Calf Veins: No evidence of thrombus. Normal compressibility and flow on color Doppler imaging. Superficial Great Saphenous Vein: No evidence of thrombus. Normal compressibility. Venous Reflux:  None. Other Findings: No evidence of superficial thrombophlebitis or abnormal fluid collection. IMPRESSION: 1. Suggestion of nonocclusive thrombus in the visualized right posterior tibial and peroneal veins. 2. No evidence of deep venous thrombosis in the left lower extremity. Electronically Signed   By: Marcey Moan M.D.   On: 03/19/2024 09:19   CT Angio Chest PE W and/or Wo Contrast Result Date: 03/18/2024 EXAM: CTA CHEST 03/18/2024 09:56:31 PM TECHNIQUE: CTA of the chest was performed after the administration of 75 mL of iohexol  (OMNIPAQUE ) 350 MG/ML injection. Multiplanar reformatted images are provided for review. MIP images are provided for review. Automated exposure control, iterative reconstruction, and/or weight based adjustment of the mA/kV was utilized to reduce the radiation dose to as low as reasonably achievable.  COMPARISON: PET CT dated 12/21/2023 CLINICAL HISTORY: eval PE. lung cancer patient, acutely hypoxic, tachy. FINDINGS: PULMONARY ARTERIES: Pulmonary arteries are adequately opacified for evaluation. Segmental/subsegmental emboli in the right middle and lower lobe pulmonary arteries (images 57, 65, 66, and 70). Overall clot burden is small to moderate. Main pulmonary artery is normal in caliber. MEDIASTINUM: Right chest port terminates at the cavoatrial junction. Cardiomegaly. The pericardium demonstrates no acute abnormality. Mild thoracic aortic atherosclerosis. LYMPH NODES: No mediastinal, hilar or axillary lymphadenopathy. LUNGS AND PLEURA: Multifocal patchy/nodular opacities in the left lung reflecting a combination of atelectasis and tumor, including a 2.0 cm perifissural nodule along the left fissure (image 34) and 4.5 cm pleural-based mass in the posterior left lower hemithorax (image 87). Overall grossly unchanged. Trace left pleural effusion. No pneumothorax. UPPER ABDOMEN: Cholelithiasis. Limited images of the upper abdomen demonstrate cholelithiasis. SOFT TISSUES AND BONES: Severe compression fracture deformity at T6 (sagittal image 81), new. Known multifocal osseous metastases are not evident on CT, although hypermetabolic on PET. No acute soft tissue abnormality. IMPRESSION: 1. Acute segmental/subsegmental pulmonary emboli in the right middle and lower lobe arteries. Small to moderate clot burden. 2. New severe T6 compression fracture deformity. 3. Left lung multifocal opacities reflecting atelectasis and tumor, overall grossly unchanged. 4. Known osseous metastases are better evaluated on PET. 5. Critical Value/emergent results were called by telephone at the time of interpretation on 03/18/2024 at 2215 hrs to provider Dr Claudene. Electronically signed by: Pinkie Pebbles MD 03/18/2024 10:19 PM EST RP Workstation: [id number]   DG Chest Port 1 View Result Date: 03/18/2024 EXAM: 1 VIEW(S) XRAY OF THE  CHEST 03/18/2024 07:05:00 PM COMPARISON: 07/22/2023 CLINICAL HISTORY: Questionable sepsis - evaluate for abnormality FINDINGS: LINES, TUBES AND DEVICES: Right chest wall port stable in position. LUNGS AND PLEURA: Small left pleural effusion. Retrocardiac airspace opacity. Developing left upper lobe airspace opacity. No pneumothorax. HEART AND MEDIASTINUM: Cardiomegaly, unchanged. Aortic arch atherosclerosis. BONES AND SOFT TISSUES: Rotator cuff anchor suture along the right shoulder noted. No acute osseous abnormality. IMPRESSION: 1. Developing left upper lobe airspace opacity and retrocardiac airspace opacity, suspicious  for pneumonia or aspiration. Follow-up PA and lateral chest x-ray is recommended in 3-4 weeks following therapy to ensure resolution and exclude underlying malignancy. 2. Small left pleural effusion with associated retroperitoneal airspace opacity . Electronically signed by: Morgane Naveau MD 03/18/2024 07:27 PM EST RP Workstation: [id number]    PERFORMANCE STATUS (ECOG) : 3 - Symptomatic, >50% confined to bed  Review of Systems Unless otherwise noted, a complete review of systems is negative.  Physical Exam General: NAD Cardiovascular: Irregular, tachycardic Pulmonary: clear ant fields, on high flow Abdomen: soft, nontender, + bowel sounds GU: no suprapubic tenderness Extremities: no edema, no joint deformities Skin: no rashes Neurological: Weakness but otherwise nonfocal  IMPRESSION: Patient seen in the ICU.  Her friend, Candis, is at bedside.  Patient admitted with respiratory failure found to have acute subsegmental PE of the right middle and lower arteries with reported small to moderate clot burden on CTA.  Patient remains in ICU on HFNC but less oxygen requirements today (8 L).  Patient now with A-fib RVR.  I met with patient and her friend, Candis, to discuss goals.  Reportedly, Candis is patient's HCPOA.   Patient verbalized agreement with current scope of treatment.   She recognizes that this hospitalization will likely delay next cycle of chemotherapy but patient wishes to proceed with cancer treatment if/when she is able.  We discussed CODE STATUS.  Patient stated clearly and repeatedly that she was not interested in resuscitation nor would she want her life prolonged artificially machines.  She verbalized a desire to have a natural death and focus on comfort at end-of-life.  Her friend verbalized agreement with DNR/DNI.  Will change CODE STATUS to reflect this decision.  PLAN: - Continue current scope of treatment - DNR/DNI - Will plan outpatient follow-up  Case and plan discussed with Dr. Jacobo  Time Total: 30 minutes  Visit consisted of counseling and education dealing with the complex and emotionally intense issues of symptom management and palliative care in the setting of serious and potentially life-threatening illness.Greater than 50%  of this time was spent counseling and coordinating care related to the above assessment and plan.  Signed by: Fonda Mower, PhD, NP-C

## 2024-03-21 NOTE — Progress Notes (Signed)
 Patient has refused to be turned today as well as a bath. She does not want us  to move her d/t the pain she has when done. Was able to do CHG on the front.

## 2024-03-21 NOTE — Plan of Care (Signed)
  Problem: Education: Goal: Knowledge of General Education information will improve Description: Including pain rating scale, medication(s)/side effects and non-pharmacologic comfort measures Outcome: Progressing   Problem: Clinical Measurements: Goal: Cardiovascular complication will be avoided Outcome: Progressing   Problem: Nutrition: Goal: Adequate nutrition will be maintained Outcome: Progressing   Problem: Coping: Goal: Level of anxiety will decrease Outcome: Progressing   Problem: Pain Managment: Goal: General experience of comfort will improve and/or be controlled Outcome: Progressing   Problem: Safety: Goal: Ability to remain free from injury will improve Outcome: Progressing

## 2024-03-21 NOTE — Progress Notes (Signed)
 ANTICOAGULATION CONSULT NOTE  Pharmacy Consult for heparin  infusion Indication: NSTEMI and new onset Afib RVR with PE  Allergies  Allergen Reactions   Ezetimibe Other (See Comments)    Body aches, and stiffness   Codeine Diarrhea   Cortisone Swelling    injections   Lovastatin Other (See Comments)    Muscle cramps    Patient Measurements: Height: 5' (152.4 cm) Weight: 67.6 kg (149 lb 0.5 oz) IBW/kg (Calculated) : 45.5 HEPARIN  DW (KG): 62.2  Vital Signs: Temp: 98.8 F (37.1 C) (11/17 0500) Temp Source: Axillary (11/17 0500) BP: 132/75 (11/17 0500) Pulse Rate: 83 (11/17 0500)  Labs: Recent Labs    03/18/24 1850 03/18/24 2250 03/19/24 0326 03/19/24 1123 03/20/24 0624 03/20/24 1509 03/21/24 0001 03/21/24 0451  HGB 9.9*  --  8.8*  --  9.1*  --   --  8.0*  HCT 30.7*  --  27.1*  --  28.3*  --   --  26.2*  PLT 465*  --  410*  --  457*  --   --  446*  APTT  --  26  --   --   --   --   --   --   LABPROT 14.6  --   --   --   --   --   --   --   INR 1.1  --   --   --   --   --   --   --   HEPARINUNFRC  --   --  0.30   < > 0.29* 0.32 0.30 0.28*  CREATININE 1.05*  --  1.14*  --  0.71  --   --  0.63   < > = values in this interval not displayed.    Estimated Creatinine Clearance: 48.9 mL/min (by C-G formula based on SCr of 0.63 mg/dL).   Medical History: Past Medical History:  Diagnosis Date   Acid reflux    Anemia    Anesthesia complication    Tachycardia previously, now on metoprolol    Arthritis    09/02/2019: per patient have it real bad in both hands and back   Difficult intubation    had to terminate intubation unable to advance tube for cholecystectomy 2017?   DVT (deep venous thrombosis) (HCC)    leg   Hypertension    Sciatica    09/02/2019: has had it for about 3 years    Assessment: Pt is a 78 yo female presenting to ED c/o SOB and fall, found with new onset Afib , found with elevated Troponin level trending up and BNP.  Goal of Therapy:  Heparin   level 0.3-0.7 units/ml Monitor platelets by anticoagulation protocol: Yes  11/15 0326 HL 0.30, therapeutic x 1 11/15 1123 HL 0.25, subtherapeutic@950  units/hr 11/15 2007 HL 0.26, subtherapeutic @ 1100 units/hr 11/16 0624 HL 0.29, subtherapeutic 11/16 1509 HL 0.32, therapeutic x 1 11/17 0001 HL 0.30, therapeutic x 2 11/17 0451 HL 0.28, subtherapeutic   Plan:  Bolus 900 units x 1 Will increase heparin  infusion to 1550 units/hr Recheck HL in 8 hrs after rate change CBC daily while on heparin   Rankin CANDIE Dills, PharmD, [facility] 03/21/2024 6:08 AM

## 2024-03-21 NOTE — Progress Notes (Incomplete)
 ANTICOAGULATION CONSULT NOTE  Pharmacy Consult for heparin  infusion Indication: NSTEMI and new onset Afib RVR with PE  Allergies  Allergen Reactions   Ezetimibe Other (See Comments)    Body aches, and stiffness   Codeine Diarrhea   Cortisone Swelling    injections   Lovastatin Other (See Comments)    Muscle cramps    Patient Measurements: Height: 5' (152.4 cm) Weight: 78.2 kg (172 lb 6.4 oz) IBW/kg (Calculated) : 45.5 HEPARIN  DW (KG): 62.2  Vital Signs: Temp: 98.8 F (37.1 C) (11/17 0500) Temp Source: Axillary (11/17 0500) BP: 116/73 (11/17 0600) Pulse Rate: 36 (11/17 0600)  Labs: Recent Labs    03/18/24 1850 03/18/24 2250 03/19/24 0326 03/19/24 1123 03/20/24 0624 03/20/24 1509 03/21/24 0001 03/21/24 0451  HGB 9.9*  --  8.8*  --  9.1*  --   --  8.0*  HCT 30.7*  --  27.1*  --  28.3*  --   --  26.2*  PLT 465*  --  410*  --  457*  --   --  446*  APTT  --  26  --   --   --   --   --   --   LABPROT 14.6  --   --   --   --   --   --   --   INR 1.1  --   --   --   --   --   --   --   HEPARINUNFRC  --   --  0.30   < > 0.29* 0.32 0.30 0.28*  CREATININE 1.05*  --  1.14*  --  0.71  --   --  0.63   < > = values in this interval not displayed.    Estimated Creatinine Clearance: 52.8 mL/min (by C-G formula based on SCr of 0.63 mg/dL).   Medical History: Past Medical History:  Diagnosis Date   Acid reflux    Anemia    Anesthesia complication    Tachycardia previously, now on metoprolol    Arthritis    09/02/2019: per patient have it real bad in both hands and back   Difficult intubation    had to terminate intubation unable to advance tube for cholecystectomy 2017?   DVT (deep venous thrombosis) (HCC)    leg   Hypertension    Sciatica    09/02/2019: has had it for about 3 years    Assessment: Pt is a 78 yo female presenting to ED c/o SOB and fall, found with new onset Afib , found with elevated Troponin level trending up and BNP.  Goal of Therapy:  Heparin   level 0.3-0.7 units/ml Monitor platelets by anticoagulation protocol: Yes  11/15 0326 HL 0.30, therapeutic x 1 11/15 1123 HL 0.25, subtherapeutic@950  units/hr 11/15 2007 HL 0.26, subtherapeutic @ 1100 units/hr 11/16 0624 HL 0.29, subtherapeutic 11/16 1509 HL 0.32, therapeutic x 1 11/17 0001 HL 0.30, therapeutic x 2 11/17 0451 HL 0.28, subtherapeutic   Plan:  Bolus 900 units x 1 Will increase heparin  infusion to 1550 units/hr Recheck HL in 8 hrs after rate change CBC daily while on heparin   Rankin CANDIE Dills, PharmD, [facility] 03/21/2024 7:15 AM

## 2024-03-21 NOTE — Progress Notes (Signed)
 ANTICOAGULATION CONSULT NOTE  Pharmacy Consult for heparin  infusion Indication: NSTEMI and new onset Afib RVR with PE  Allergies  Allergen Reactions   Ezetimibe Other (See Comments)    Body aches, and stiffness   Codeine Diarrhea   Cortisone Swelling    injections   Lovastatin Other (See Comments)    Muscle cramps    Patient Measurements: Height: 5' (152.4 cm) Weight: 78.2 kg (172 lb 6.4 oz) IBW/kg (Calculated) : 45.5 HEPARIN  DW (KG): 62.2  Vital Signs: Temp: 100.2 F (37.9 C) (11/17 1200) Temp Source: Axillary (11/17 1200) BP: 120/70 (11/17 1330) Pulse Rate: 130 (11/17 1330)  Labs: Recent Labs    03/18/24 1850 03/18/24 2250 03/19/24 0326 03/19/24 1123 03/20/24 0624 03/20/24 1509 03/21/24 0001 03/21/24 0451 03/21/24 1519  HGB 9.9*  --  8.8*  --  9.1*  --   --  8.0*  --   HCT 30.7*  --  27.1*  --  28.3*  --   --  26.2*  --   PLT 465*  --  410*  --  457*  --   --  446*  --   APTT  --  26  --   --   --   --   --   --   --   LABPROT 14.6  --   --   --   --   --   --   --   --   INR 1.1  --   --   --   --   --   --   --   --   HEPARINUNFRC  --   --  0.30   < > 0.29*   < > 0.30 0.28* 0.37  CREATININE 1.05*  --  1.14*  --  0.71  --   --  0.63  --    < > = values in this interval not displayed.    Estimated Creatinine Clearance: 52.8 mL/min (by C-G formula based on SCr of 0.63 mg/dL).   Medical History: Past Medical History:  Diagnosis Date   Acid reflux    Anemia    Anesthesia complication    Tachycardia previously, now on metoprolol    Arthritis    09/02/2019: per patient have it real bad in both hands and back   Difficult intubation    had to terminate intubation unable to advance tube for cholecystectomy 2017?   DVT (deep venous thrombosis) (HCC)    leg   Hypertension    Sciatica    09/02/2019: has had it for about 3 years    Assessment: Pt is a 78 yo female presenting to ED c/o SOB and fall, found with new onset Afib , found with elevated Troponin  level trending up and BNP.  Goal of Therapy:  Heparin  level 0.3-0.7 units/ml Monitor platelets by anticoagulation protocol: Yes  11/15 0326 HL 0.30, therapeutic x 1 11/15 1123 HL 0.25, subtherapeutic@950  units/hr 11/15 2007 HL 0.26, subtherapeutic @ 1100 units/hr 11/16 0624 HL 0.29, subtherapeutic 11/16 1509 HL 0.32, therapeutic x 1 11/17 0001 HL 0.30, therapeutic x 2 11/17 0451 HL 0.28, subtherapeutic 11/17 1519 HL 0.37, therapeutic x 1   Plan:  HL therapeutic x 1 Continue heparin  infusion at 1550 units/hr Recheck HL in 8 hours to confirm Daily CBC while on heparin   Kayla JULIANNA Blew, PharmD, BCPS 03/21/2024 3:41 PM

## 2024-03-21 NOTE — Plan of Care (Signed)

## 2024-03-21 NOTE — Consult Note (Signed)
 PHARMACY CONSULT NOTE - ELECTROLYTES  Pharmacy Consult for Electrolyte Monitoring and Replacement   Recent Labs: Height: 5' (152.4 cm) Weight: 78.2 kg (172 lb 6.4 oz) IBW/kg (Calculated) : 45.5 Estimated Creatinine Clearance: 52.8 mL/min (by C-G formula based on SCr of 0.63 mg/dL). Potassium (mmol/L)  Date Value  03/21/2024 4.4   Magnesium (mg/dL)  Date Value  88/83/7974 2.1   Calcium (mg/dL)  Date Value  88/82/7974 8.5 (L)   Albumin  (g/dL)  Date Value  88/85/7974 3.0 (L)   Phosphorus (mg/dL)  Date Value  88/83/7974 3.6   Sodium (mmol/L)  Date Value  03/21/2024 129 (L)   Corrected Ca: 9.3 mg/dL  Assessment  Hannah Yu is a 78 y.o. female presenting with shortness of breath. 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 Pharmacy has been consulted to monitor and replace electrolytes.  Diet: NPO MIVF: N/A Pertinent medications: N/A  Goal of Therapy:  Potassium 4.0 - 5.1 mmol/L Magnesium 2.0 - 2.4 mg/dL All Other Electrolytes WNL  Plan:  No replacement currently indicated Check BMP, Mg, Phos with AM labs  Thank you for allowing pharmacy to be a part of this patient's care.  Adriana JONETTA Bolster, PharmD, BCPS Clinical Pharmacist 03/21/2024 7:15 AM

## 2024-03-21 NOTE — Consult Note (Addendum)
 [facility]  Telephone:(336) [phone] Fax:(336) [phone]  ID: Hannah Yu OB: [birth year]  MR#: [id number]  RDW#:[id number]  Patient Care Team: Epifanio Alm SQUIBB, MD as PCP - General (Infectious Diseases) Verdene Gills, RN as Oncology Nurse Navigator Lenn Aran, MD as Consulting Physician (Radiation Oncology) Jacobo Evalene PARAS, MD as Consulting Physician (Oncology)  CHIEF COMPLAINT: Progressive stage IV lung cancer, now with pulmonary embolism.  INTERVAL HISTORY: Patient is a 78 year old female who was recently admitted with declining performance status, fall and found to have pulmonary embolism.  Also, while in the hospital she developed atrial fibrillation with rapid ventricular response. She was scheduled to have cycle 3 of carboplatin, Taxol, and Avastin last week, but given severe hyponatremia this was delayed.  She continues to have significant weakness and fatigue is also complaining of neuropathy pain.  She has no other neurologic complaints she denies any recent fevers.  She has a poor appetite.  She has no chest pain, shortness of breath, cough, or hemoptysis.  She denies any nausea, vomiting, constipation, diarrhea.  She has no urinary complaints.  Patient offers no further specific complaints today.  REVIEW OF SYSTEMS:   Review of Systems  Constitutional:  Positive for malaise/fatigue. Negative for fever and weight loss.  Respiratory: Negative.  Negative for cough, hemoptysis and shortness of breath.   Cardiovascular: Negative.  Negative for chest pain and leg swelling.  Gastrointestinal: Negative.  Negative for abdominal pain.  Genitourinary: Negative.  Negative for dysuria.  Musculoskeletal:  Positive for joint pain.  Skin: Negative.  Negative for rash.    As per HPI. Otherwise, a complete review of systems is negative.  PAST MEDICAL HISTORY: Past Medical History:  Diagnosis Date   Acid reflux    Anemia    Anesthesia complication     Tachycardia previously, now on metoprolol    Arthritis    09/02/2019: per patient have it real bad in both hands and back   Difficult intubation    had to terminate intubation unable to advance tube for cholecystectomy 2017?   DVT (deep venous thrombosis) (HCC)    leg   Hypertension    Sciatica    09/02/2019: has had it for about 3 years    PAST SURGICAL HISTORY: Past Surgical History:  Procedure Laterality Date   ABDOMINAL HYSTERECTOMY     CATARACT EXTRACTION W/ INTRAOCULAR LENS IMPLANT Bilateral 2007   eyes done within 1 month of each other.   IR IMAGING GUIDED PORT INSERTION  05/19/2023   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   TOTAL HIP ARTHROPLASTY Right 09/06/2019   Procedure: RIGHT TOTAL HIP ARTHROPLASTY ANTERIOR APPROACH;  Surgeon: Vernetta Lonni GRADE, MD;  Location: [facility] OR;  Service: Orthopedics;  Laterality: Right;   TOTAL HIP ARTHROPLASTY Left 07/31/2020   Procedure: LEFT TOTAL HIP ARTHROPLASTY ANTERIOR APPROACH;  Surgeon: Vernetta Lonni GRADE, MD;  Location: [facility] OR;  Service: Orthopedics;  Laterality: Left;   VIDEO BRONCHOSCOPY WITH ENDOBRONCHIAL ULTRASOUND N/A 10/06/2022   Procedure: VIDEO BRONCHOSCOPY WITH ENDOBRONCHIAL ULTRASOUND;  Surgeon: Parris Manna, MD;  Location: [facility] ORS;  Service: Thoracic;  Laterality: N/A;    FAMILY HISTORY: Family History  Problem Relation Age of Onset   Breast cancer Sister 41    ADVANCED DIRECTIVES (Y/N):  @ADVDIR @  HEALTH MAINTENANCE: Social History   Tobacco Use   Smoking status: Former    Current packs/day: 0.00    Types: Cigarettes    Quit date: 01/02/1993  Years since quitting: 31.2   Smokeless tobacco: Never  Vaping Use   Vaping status: Never Used  Substance Use Topics   Alcohol use: Yes    Alcohol/week: 4.0 standard drinks of alcohol    Types: 4 Glasses of wine per week    Comment: 09/02/2019: per patient ever so often   Drug use: No      Colonoscopy:  PAP:  Bone density:  Lipid panel:  Allergies  Allergen Reactions   Ezetimibe Other (See Comments)    Body aches, and stiffness   Codeine Diarrhea   Cortisone Swelling    injections   Lovastatin Other (See Comments)    Muscle cramps    Current Facility-Administered Medications  Medication Dose Route Frequency Provider Last Rate Last Admin   alum & mag hydroxide-simeth (MAALOX/MYLANTA) 200-200-20 MG/5ML suspension 15 mL  15 mL Oral Q6H PRN Nelson, Dana G, NP   15 mL at 03/21/24 9060   amiodarone (NEXTERONE PREMIX) 360-4.14 MG/200ML-% (1.8 mg/mL) IV infusion  60 mg/hr Intravenous Continuous Hudson, Caralyn, PA-C 33.3 mL/hr at 03/21/24 1309 60 mg/hr at 03/21/24 1309   Followed by   amiodarone (NEXTERONE PREMIX) 360-4.14 MG/200ML-% (1.8 mg/mL) IV infusion  30 mg/hr Intravenous Continuous Hudson, Caralyn, PA-C       azithromycin (ZITHROMAX) 500 mg in sodium chloride  0.9 % 250 mL IVPB  500 mg Intravenous Q24H Isadora Hose, MD   Stopped at 03/20/24 1841   cefTRIAXone (ROCEPHIN) 2 g in sodium chloride  0.9 % 100 mL IVPB  2 g Intravenous Q24H Isadora Hose, MD 200 mL/hr at 03/21/24 1305 2 g at 03/21/24 1305   Chlorhexidine  Gluconate Cloth 2 % PADS 6 each  6 each Topical Daily Kathrene Almarie Bake, NP   6 each at 03/21/24 9056   docusate sodium  (COLACE) capsule 100 mg  100 mg Oral BID PRN Kathrene Almarie Bake, NP   100 mg at 03/21/24 0939   DULoxetine (CYMBALTA) DR capsule 30 mg  30 mg Oral Daily Isadora Hose, MD   30 mg at 03/21/24 9060   feeding supplement (BOOST / RESOURCE BREEZE) liquid 1 Container  1 Container Oral TID BM Isadora Hose, MD   1 Container at 03/21/24 1306   heparin  ADULT infusion 100 units/mL (25000 units/250mL)  1,550 Units/hr Intravenous Continuous Dail Rankin RAMAN, RPH 15.5 mL/hr at 03/21/24 1300 1,550 Units/hr at 03/21/24 1300   magic mouthwash w/lidocaine   5 mL Oral QID Isadora Hose, MD   5 mL at 03/21/24 1306   metoprolol  tartrate  (LOPRESSOR ) tablet 12.5 mg  12.5 mg Oral BID Hudson, Caralyn, PA-C   12.5 mg at 03/21/24 0945   oxyCODONE  (Oxy IR/ROXICODONE ) immediate release tablet 5-10 mg  5-10 mg Oral Q4H PRN Kathrene Almarie Bake, NP   10 mg at 03/20/24 1117   polyethylene glycol (MIRALAX / GLYCOLAX) packet 17 g  17 g Oral Daily PRN Kathrene Almarie Bake, NP   17 g at 03/21/24 0807   simethicone (MYLICON) chewable tablet 80 mg  80 mg Oral QID PRN Bousman, Karlie, PA-C   80 mg at 03/21/24 0939    OBJECTIVE: Vitals:   03/21/24 1315 03/21/24 1330  BP:  120/70  Pulse: 90 (!) 130  Resp: 17 20  Temp:    SpO2: 97% 96%     Body mass index is 33.67 kg/m.    ECOG FS:4 - Bedbound  General: Ill-appearing, no acute distress. Eyes: Pink conjunctiva, anicteric sclera. HEENT: Normocephalic, moist mucous membranes. Lungs: No audible wheezing or coughing. Heart:  Regular rate and rhythm. Abdomen: Soft, nontender, no obvious distention. Musculoskeletal: No edema, cyanosis, or clubbing. Neuro: Alert, answering all questions appropriately. Cranial nerves grossly intact. Skin: No rashes or petechiae noted. Psych: Normal affect.  LAB RESULTS:  Lab Results  Component Value Date   NA 129 (L) 03/21/2024   K 4.4 03/21/2024   CL 95 (L) 03/21/2024   CO2 25 03/21/2024   GLUCOSE 112 (H) 03/21/2024   BUN 17 03/21/2024   CREATININE 0.63 03/21/2024   CALCIUM 8.5 (L) 03/21/2024   PROT 5.8 (L) 03/21/2024   ALBUMIN  2.7 (L) 03/21/2024   AST 26 03/21/2024   ALT 27 03/21/2024   ALKPHOS 240 (H) 03/21/2024   BILITOT 0.2 03/21/2024   GFRNONAA >60 03/21/2024   GFRAA >60 09/10/2019    Lab Results  Component Value Date   WBC 15.1 (H) 03/21/2024   NEUTROABS 24.0 (H) 03/18/2024   HGB 8.0 (L) 03/21/2024   HCT 26.2 (L) 03/21/2024   MCV 79.6 (L) 03/21/2024   PLT 446 (H) 03/21/2024     STUDIES: DG Chest Port 1 View Result Date: 03/21/2024 EXAM: 1 VIEW(S) XRAY OF THE CHEST 03/21/2024 09:05:00 AM COMPARISON: 03/18/2024 CLINICAL  HISTORY: Acute hypoxic respiratory failure (HCC) 8228802. FINDINGS: LINES, TUBES AND DEVICES: Stable right IJ port catheter to the cavoatrial junction. LUNGS AND PLEURA: Worsening perihilar opacities. Worsening and left retrocardiac consolidation/atelectasis. Worsening linear perihilar opacities. Probable small left pleural effusion. No pneumothorax. HEART AND MEDIASTINUM: Aortic atherosclerosis (ICD-10: I70.0). BONES AND SOFT TISSUES: No acute osseous abnormality. IMPRESSION: 1. Worsening perihilar opacities and left retrocardiac consolidation/atelectasis. 2. Probable small left pleural effusion. Electronically signed by: Katheleen Faes MD 03/21/2024 09:59 AM EST RP Workstation: [id number]   DG HIP UNILAT WITH PELVIS 2-3 VIEWS LEFT Result Date: 03/20/2024 CLINICAL DATA:  Pain with recent fall. EXAM: DG HIP (WITH OR WITHOUT PELVIS) 2-3V LEFT COMPARISON:  12/17/2020, 12/21/2023. FINDINGS: Increased lucency is noted at the acetabulum on the left, concerning for metastatic disease and seen on prior PET-CT. There are cortical irregularities involving the medial aspect of the pelvic rim at the acetabulum and inferior pubic ramus, which may be due to metastatic disease. The possibility of superimposed in fracture cannot be excluded. 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 There degenerative changes in the lower lumbar spine. IMPRESSION: 1. Increased lucency a at the acetabulum on the left, likely related to known metastatic disease. Cortical irregularities are also seen along the pelvic rim at the acetabulum on the left and inferior pubic ramus, raising the possibility for superimposed fracture. 2. Total hip arthroplasty changes bilaterally. Electronically Signed   By: Leita Birmingham M.D.   On: 03/20/2024 17:05   ECHOCARDIOGRAM COMPLETE Result Date: 03/19/2024    ECHOCARDIOGRAM REPORT   Patient Name:   Hannah Yu Date of Exam: 03/19/2024 Medical Rec #:  [id number]           Height:       60.0  in Accession #:    [id number]          Weight:       172.4 lb Date of Birth:  1946/03/24            BSA:          1.752 m Patient Age:    78 years            BP:           109/78 mmHg Patient Gender: F  HR:           94 bpm. Exam Location:  [facility] Procedure: 2D Echo, Color Doppler and Cardiac Doppler (Both Spectral and Color            Flow Doppler were utilized during procedure). STAT ECHO Indications:     Dyspnea R06.00  History:         Patient has prior history of Echocardiogram examinations, most                  recent 09/09/2019.  Sonographer:     Thedora Louder RDCS, FASE Referring Phys:  [id number] KHABIB DGAYLI Diagnosing Phys: Annabella Scarce MD IMPRESSIONS  1. Compared with the echo in 2021, right ventricular dilation and dysfunction are new. Consistent with known finding of pulmonary embolis.  2. Left ventricular ejection fraction, by estimation, is 60 to 65%. The left ventricle has normal function. The left ventricle has no regional wall motion abnormalities. Left ventricular diastolic parameters are consistent with Grade I diastolic dysfunction (impaired relaxation).  3. Right ventricular systolic function is moderately reduced. The right ventricular size is moderately enlarged. There is severely elevated pulmonary artery systolic pressure.  4. Right atrial size was severely dilated.  5. The mitral valve is normal in structure. No evidence of mitral valve regurgitation. No evidence of mitral stenosis.  6. 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  7. The aortic valve is tricuspid. Aortic valve regurgitation is not visualized. No aortic stenosis is present.  8. The inferior vena cava is dilated in size with <50% respiratory variability, suggesting right atrial pressure of 15 mmHg. FINDINGS  Left Ventricle: Left ventricular ejection fraction, by estimation, is 60 to 65%. The left ventricle has normal function. The left ventricle has no regional wall motion abnormalities. The left ventricular  internal cavity size was normal in size. There is  no left ventricular hypertrophy. Left ventricular diastolic parameters are consistent with Grade I diastolic dysfunction (impaired relaxation). Normal left ventricular filling pressure. Right Ventricle: The right ventricular size is moderately enlarged. No increase in right ventricular wall thickness. Right ventricular systolic function is moderately reduced. There is severely elevated pulmonary artery systolic pressure. The tricuspid regurgitant velocity is 3.46 m/s, and with an assumed right atrial pressure of 15 mmHg, the estimated right ventricular systolic pressure is 62.9 mmHg. Left Atrium: Left atrial size was normal in size. Right Atrium: Right atrial size was severely dilated. Pericardium: There is no evidence of pericardial effusion. Mitral Valve: The mitral valve is normal in structure. No evidence of mitral valve regurgitation. No evidence of mitral valve stenosis. Tricuspid Valve: The tricuspid valve is normal in structure. 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 No evidence of tricuspid stenosis. Aortic Valve: The aortic valve is tricuspid. Aortic valve regurgitation is not visualized. No aortic stenosis is present. Aortic valve peak gradient measures 6.9 mmHg. Pulmonic Valve: The pulmonic valve was normal in structure. Pulmonic valve regurgitation is not visualized. No evidence of pulmonic stenosis. Aorta: The aortic root is normal in size and structure. Venous: The inferior vena cava is dilated in size with less than 50% respiratory variability, suggesting right atrial pressure of 15 mmHg. IAS/Shunts: No atrial level shunt detected by color flow Doppler.  LEFT VENTRICLE PLAX 2D LVIDd:         2.90 cm   Diastology LVIDs:         2.10 cm   LV e' medial:    10.00 cm/s LV IVS:        1.00 cm  LV E/e' medial:  6.9 LVOT diam:     1.90 cm   LV e' lateral:   13.60 cm/s LV SV:         30        LV E/e' lateral: 5.1 LV SV Index:   17 LVOT Area:     2.84 cm   RIGHT VENTRICLE RV Basal diam:  3.40 cm RV S prime:     9.46 cm/s TAPSE (M-mode): 1.6 cm LEFT ATRIUM           Index        RIGHT ATRIUM           Index LA Vol (A4C): 40.2 ml 22.94 ml/m  RA Area:     23.80 cm                                    RA Volume:   75.20 ml  42.91 ml/m  AORTIC VALVE                 PULMONIC VALVE AV Area (Vmax): 1.31 cm     PV Vmax:        0.76 m/s AV Vmax:        131.00 cm/s  PV Peak grad:   2.3 mmHg AV Peak Grad:   6.9 mmHg     RVOT Peak grad: 1 mmHg LVOT Vmax:      60.70 cm/s LVOT Vmean:     38.700 cm/s LVOT VTI:       0.105 m  AORTA Ao Root diam: 3.00 cm Ao Asc diam:  3.30 cm MITRAL VALVE               TRICUSPID VALVE MV Area (PHT): 2.24 cm    TR Peak grad:   47.9 mmHg MV Decel Time: 338 msec    TR Vmax:        346.00 cm/s MV E velocity: 68.70 cm/s MV A velocity: 98.00 cm/s  SHUNTS MV E/A ratio:  0.70        Systemic VTI:  0.10 m                            Systemic Diam: 1.90 cm Annabella Scarce MD Electronically signed by Annabella Scarce MD Signature Date/Time: 03/19/2024/11:15:10 AM    Final (Updated)    US  Venous Img Lower Bilateral (DVT) Result Date: 03/19/2024 CLINICAL DATA:  Pulmonary embolism. EXAM: BILATERAL LOWER EXTREMITY VENOUS DOPPLER ULTRASOUND TECHNIQUE: Gray-scale sonography with graded compression, as well as color Doppler and duplex ultrasound were performed to evaluate the lower extremity deep venous systems from the level of the common femoral vein and including the common femoral, femoral, profunda femoral, popliteal and calf veins including the posterior tibial, peroneal and gastrocnemius veins when visible. The superficial great saphenous vein was also interrogated. Spectral Doppler was utilized to evaluate flow at rest and with distal augmentation maneuvers in the common femoral, femoral and popliteal veins. COMPARISON:  None Available. FINDINGS: RIGHT LOWER EXTREMITY Common Femoral Vein: No evidence of thrombus. Normal compressibility, respiratory  phasicity and response to augmentation. Saphenofemoral Junction: No evidence of thrombus. Normal compressibility and flow on color Doppler imaging. Profunda Femoral Vein: No evidence of thrombus. Normal compressibility and flow on color Doppler imaging. Femoral Vein: No evidence of thrombus. Normal compressibility, respiratory phasicity and response to augmentation. Popliteal Vein: No evidence of  thrombus. Normal compressibility, respiratory phasicity and response to augmentation. Calf Veins: Suggestion of nonocclusive thrombus in the visualized right posterior tibial and peroneal veins. Superficial Great Saphenous Vein: No evidence of thrombus. Normal compressibility. Venous Reflux:  None. Other Findings: No evidence of superficial thrombophlebitis or abnormal fluid collection. LEFT LOWER EXTREMITY Common Femoral Vein: No evidence of thrombus. Normal compressibility, respiratory phasicity and response to augmentation. Saphenofemoral Junction: No evidence of thrombus. Normal compressibility and flow on color Doppler imaging. Profunda Femoral Vein: No evidence of thrombus. Normal compressibility and flow on color Doppler imaging. Femoral Vein: No evidence of thrombus. Normal compressibility, respiratory phasicity and response to augmentation. Popliteal Vein: No evidence of thrombus. Normal compressibility, respiratory phasicity and response to augmentation. Calf Veins: No evidence of thrombus. Normal compressibility and flow on color Doppler imaging. Superficial Great Saphenous Vein: No evidence of thrombus. Normal compressibility. Venous Reflux:  None. Other Findings: No evidence of superficial thrombophlebitis or abnormal fluid collection. IMPRESSION: 1. Suggestion of nonocclusive thrombus in the visualized right posterior tibial and peroneal veins. 2. No evidence of deep venous thrombosis in the left lower extremity. Electronically Signed   By: Marcey Moan M.D.   On: 03/19/2024 09:19   CT Angio Chest PE W  and/or Wo Contrast Result Date: 03/18/2024 EXAM: CTA CHEST 03/18/2024 09:56:31 PM TECHNIQUE: CTA of the chest was performed after the administration of 75 mL of iohexol  (OMNIPAQUE ) 350 MG/ML injection. Multiplanar reformatted images are provided for review. MIP images are provided for review. Automated exposure control, iterative reconstruction, and/or weight based adjustment of the mA/kV was utilized to reduce the radiation dose to as low as reasonably achievable. COMPARISON: PET CT dated 12/21/2023 CLINICAL HISTORY: eval PE. lung cancer patient, acutely hypoxic, tachy. FINDINGS: PULMONARY ARTERIES: Pulmonary arteries are adequately opacified for evaluation. Segmental/subsegmental emboli in the right middle and lower lobe pulmonary arteries (images 57, 65, 66, and 70). Overall clot burden is small to moderate. Main pulmonary artery is normal in caliber. MEDIASTINUM: Right chest port terminates at the cavoatrial junction. Cardiomegaly. The pericardium demonstrates no acute abnormality. Mild thoracic aortic atherosclerosis. LYMPH NODES: No mediastinal, hilar or axillary lymphadenopathy. LUNGS AND PLEURA: Multifocal patchy/nodular opacities in the left lung reflecting a combination of atelectasis and tumor, including a 2.0 cm perifissural nodule along the left fissure (image 34) and 4.5 cm pleural-based mass in the posterior left lower hemithorax (image 87). Overall grossly unchanged. Trace left pleural effusion. No pneumothorax. UPPER ABDOMEN: Cholelithiasis. Limited images of the upper abdomen demonstrate cholelithiasis. SOFT TISSUES AND BONES: Severe compression fracture deformity at T6 (sagittal image 81), new. Known multifocal osseous metastases are not evident on CT, although hypermetabolic on PET. No acute soft tissue abnormality. IMPRESSION: 1. Acute segmental/subsegmental pulmonary emboli in the right middle and lower lobe arteries. Small to moderate clot burden. 2. New severe T6 compression fracture  deformity. 3. Left lung multifocal opacities reflecting atelectasis and tumor, overall grossly unchanged. 4. Known osseous metastases are better evaluated on PET. 5. Critical Value/emergent results were called by telephone at the time of interpretation on 03/18/2024 at 2215 hrs to provider Dr Claudene. Electronically signed by: Pinkie Pebbles MD 03/18/2024 10:19 PM EST RP Workstation: [id number]   DG Chest Port 1 View Result Date: 03/18/2024 EXAM: 1 VIEW(S) XRAY OF THE CHEST 03/18/2024 07:05:00 PM COMPARISON: 07/22/2023 CLINICAL HISTORY: Questionable sepsis - evaluate for abnormality FINDINGS: LINES, TUBES AND DEVICES: Right chest wall port stable in position. LUNGS AND PLEURA: Small left pleural effusion. Retrocardiac airspace opacity. Developing left upper lobe airspace opacity.  No pneumothorax. HEART AND MEDIASTINUM: Cardiomegaly, unchanged. Aortic arch atherosclerosis. BONES AND SOFT TISSUES: Rotator cuff anchor suture along the right shoulder noted. No acute osseous abnormality. IMPRESSION: 1. Developing left upper lobe airspace opacity and retrocardiac airspace opacity, suspicious for pneumonia or aspiration. Follow-up PA and lateral chest x-ray is recommended in 3-4 weeks following therapy to ensure resolution and exclude underlying malignancy. 2. Small left pleural effusion with associated retroperitoneal airspace opacity . Electronically signed by: Morgane Naveau MD 03/18/2024 07:27 PM EST RP Workstation: [id number]    ASSESSMENT: Progressive stage IV lung cancer, now with pulmonary embolism.  PLAN:    Progressive stage IV lung cancer: Patient last received chemotherapy approximately 4 weeks ago.  She was scheduled to have cycle 3 of carboplatin, Taxol, and Avastin later this week.  Given patient's declining performance status this has been placed on hold.  Will readdress whether treatment is possible after discharge. Pulmonary embolism: With right heart strain.  Likely multifactorial given  declining performance status and progressive malignancy.  Okay to transition to Eliquis when clinically appropriate. A-fib with RVR: Appreciate cardiology input.  Patient appears to be in regular rhythm currently.  Amiodarone has been discontinued.  Eliquis as above. Possible community-acquired pneumonia: Patient currently on azithromycin and ceftriaxone. Pain: Continue Cymbalta as well as oxycodone  as needed.  Appreciate palliative care input. Disposition: Patient may need nursing home/rehab placement upon discharge.  She is now DNR/DNI.  Appreciate consult, will follow.   Evalene JINNY Reusing, MD   03/21/2024 4:34 PM

## 2024-03-21 NOTE — Progress Notes (Signed)
 ANTICOAGULATION CONSULT NOTE  Pharmacy Consult for heparin  infusion Indication: NSTEMI and new onset Afib RVR with PE  Allergies  Allergen Reactions   Ezetimibe Other (See Comments)    Body aches, and stiffness   Codeine Diarrhea   Cortisone Swelling    injections   Lovastatin Other (See Comments)    Muscle cramps    Patient Measurements: Height: 5' (152.4 cm) Weight: 67.6 kg (149 lb 0.5 oz) IBW/kg (Calculated) : 45.5 HEPARIN  DW (KG): 62.2  Vital Signs: Temp: 97.4 F (36.3 C) (11/17 0000) Temp Source: Axillary (11/17 0000) BP: 111/53 (11/17 0000) Pulse Rate: 99 (11/17 0000)  Labs: Recent Labs    03/18/24 1850 03/18/24 2250 03/19/24 0326 03/19/24 1123 03/20/24 0624 03/20/24 1509 03/21/24 0001  HGB 9.9*  --  8.8*  --  9.1*  --   --   HCT 30.7*  --  27.1*  --  28.3*  --   --   PLT 465*  --  410*  --  457*  --   --   APTT  --  26  --   --   --   --   --   LABPROT 14.6  --   --   --   --   --   --   INR 1.1  --   --   --   --   --   --   HEPARINUNFRC  --   --  0.30   < > 0.29* 0.32 0.30  CREATININE 1.05*  --  1.14*  --  0.71  --   --    < > = values in this interval not displayed.    Estimated Creatinine Clearance: 48.9 mL/min (by C-G formula based on SCr of 0.71 mg/dL).   Medical History: Past Medical History:  Diagnosis Date   Acid reflux    Anemia    Anesthesia complication    Tachycardia previously, now on metoprolol    Arthritis    09/02/2019: per patient have it real bad in both hands and back   Difficult intubation    had to terminate intubation unable to advance tube for cholecystectomy 2017?   DVT (deep venous thrombosis) (HCC)    leg   Hypertension    Sciatica    09/02/2019: has had it for about 3 years    Assessment: Pt is a 78 yo female presenting to ED c/o SOB and fall, found with new onset Afib , found with elevated Troponin level trending up and BNP.  Goal of Therapy:  Heparin  level 0.3-0.7 units/ml Monitor platelets by  anticoagulation protocol: Yes  11/15 0326 HL 0.30, therapeutic x 1 11/15 1123 HL 0.25, subtherapeutic@950  units/hr 11/15 2007 HL 0.26, subtherapeutic @ 1100 units/hr 11/16 0624 HL 0.29, subtherapeutic 11/16 1509 HL 0.32, therapeutic x 1 11/17 0001 HL 0.30, therapeutic x 2   Plan:  Will continue heparin  infusion at 1400 units/hr Recheck HL w/ AM labs CBC daily while on heparin   Hannah Yu, PharmD, [facility] 03/21/2024 1:13 AM

## 2024-03-22 ENCOUNTER — Encounter: Payer: Self-pay | Admitting: Oncology

## 2024-03-22 ENCOUNTER — Inpatient Hospital Stay

## 2024-03-22 ENCOUNTER — Ambulatory Visit

## 2024-03-22 ENCOUNTER — Telehealth: Payer: Self-pay | Admitting: *Deleted

## 2024-03-22 DIAGNOSIS — I2602 Saddle embolus of pulmonary artery with acute cor pulmonale: Secondary | ICD-10-CM

## 2024-03-22 DIAGNOSIS — J9601 Acute respiratory failure with hypoxia: Secondary | ICD-10-CM | POA: Diagnosis not present

## 2024-03-22 DIAGNOSIS — I2699 Other pulmonary embolism without acute cor pulmonale: Secondary | ICD-10-CM | POA: Diagnosis not present

## 2024-03-22 LAB — CBC
HCT: 27.4 % — ABNORMAL LOW (ref 36.0–46.0)
Hemoglobin: 8.5 g/dL — ABNORMAL LOW (ref 12.0–15.0)
MCH: 24.6 pg — ABNORMAL LOW (ref 26.0–34.0)
MCHC: 31 g/dL (ref 30.0–36.0)
MCV: 79.2 fL — ABNORMAL LOW (ref 80.0–100.0)
Platelets: 414 K/uL — ABNORMAL HIGH (ref 150–400)
RBC: 3.46 MIL/uL — ABNORMAL LOW (ref 3.87–5.11)
RDW: 23.5 % — ABNORMAL HIGH (ref 11.5–15.5)
WBC: 16.7 K/uL — ABNORMAL HIGH (ref 4.0–10.5)
nRBC: 0.4 % — ABNORMAL HIGH (ref 0.0–0.2)

## 2024-03-22 LAB — BASIC METABOLIC PANEL WITH GFR
Anion gap: 9 (ref 5–15)
BUN: 19 mg/dL (ref 8–23)
CO2: 25 mmol/L (ref 22–32)
Calcium: 8.4 mg/dL — ABNORMAL LOW (ref 8.9–10.3)
Chloride: 92 mmol/L — ABNORMAL LOW (ref 98–111)
Creatinine, Ser: 0.87 mg/dL (ref 0.44–1.00)
GFR, Estimated: >60 mL/min (ref >60)
Glucose, Bld: 124 mg/dL — ABNORMAL HIGH (ref 70–99)
Potassium: 4.3 mmol/L (ref 3.5–5.1)
Sodium: 127 mmol/L — ABNORMAL LOW (ref 135–145)

## 2024-03-22 LAB — PHOSPHORUS: Phosphorus: 3 mg/dL (ref 2.5–4.6)

## 2024-03-22 LAB — SODIUM, URINE, RANDOM: Sodium, Ur: 78 mmol/L

## 2024-03-22 LAB — LEGIONELLA PNEUMOPHILA SEROGP 1 UR AG: L. pneumophila Serogp 1 Ur Ag: NEGATIVE

## 2024-03-22 LAB — HEPARIN LEVEL (UNFRACTIONATED)
Heparin Unfractionated: 0.57 [IU]/mL (ref 0.30–0.70)
Heparin Unfractionated: 0.62 [IU]/mL (ref 0.30–0.70)

## 2024-03-22 LAB — MAGNESIUM: Magnesium: 2.1 mg/dL (ref 1.7–2.4)

## 2024-03-22 LAB — OSMOLALITY, URINE: Osmolality, Ur: 368 mosm/kg (ref 300–900)

## 2024-03-22 LAB — OSMOLALITY: Osmolality: 275 mosm/kg (ref 275–295)

## 2024-03-22 MED ORDER — DIGOXIN 0.25 MG/ML IJ SOLN
0.1250 mg | Freq: Once | INTRAMUSCULAR | Status: AC
  Administered 2024-03-22: 0.125 mg via INTRAVENOUS
  Filled 2024-03-22: qty 2

## 2024-03-22 MED ORDER — ORAL CARE MOUTH RINSE
15.0000 mL | OROMUCOSAL | Status: DC | PRN
  Administered 2024-03-22 – 2024-03-30 (×2): 15 mL via OROMUCOSAL

## 2024-03-22 MED ORDER — SODIUM CHLORIDE 0.9 % IV SOLN
2.0000 g | INTRAVENOUS | Status: AC
  Administered 2024-03-22 – 2024-03-24 (×3): 2 g via INTRAVENOUS
  Filled 2024-03-22 (×4): qty 20

## 2024-03-22 MED ORDER — SODIUM CHLORIDE 0.9 % IV SOLN
500.0000 mg | INTRAVENOUS | Status: AC
  Administered 2024-03-22: 500 mg via INTRAVENOUS
  Filled 2024-03-22: qty 5

## 2024-03-22 MED ORDER — DIGOXIN 0.25 MG/ML IJ SOLN
INTRAMUSCULAR | Status: AC
  Filled 2024-03-22: qty 2

## 2024-03-22 MED ORDER — MENTHOL 3 MG MT LOZG
1.0000 | LOZENGE | OROMUCOSAL | Status: DC | PRN
  Administered 2024-03-22: 3 mg via ORAL
  Filled 2024-03-22 (×2): qty 9

## 2024-03-22 MED ORDER — SODIUM CHLORIDE 3 % IN NEBU
4.0000 mL | INHALATION_SOLUTION | RESPIRATORY_TRACT | Status: DC | PRN
  Filled 2024-03-22: qty 4

## 2024-03-22 MED ORDER — ALBUTEROL SULFATE (2.5 MG/3ML) 0.083% IN NEBU: 2.5000 mg | INHALATION_SOLUTION | Freq: Four times a day (QID) | RESPIRATORY_TRACT | Status: DC | PRN

## 2024-03-22 MED ORDER — SODIUM CHLORIDE 3 % IN NEBU
4.0000 mL | INHALATION_SOLUTION | Freq: Two times a day (BID) | RESPIRATORY_TRACT | Status: DC
  Administered 2024-03-22 – 2024-03-26 (×7): 4 mL via RESPIRATORY_TRACT
  Filled 2024-03-22 (×9): qty 4

## 2024-03-22 MED ORDER — ALBUTEROL SULFATE (2.5 MG/3ML) 0.083% IN NEBU
2.5000 mg | INHALATION_SOLUTION | Freq: Two times a day (BID) | RESPIRATORY_TRACT | Status: DC
  Administered 2024-03-22 – 2024-03-28 (×12): 2.5 mg via RESPIRATORY_TRACT
  Filled 2024-03-22 (×12): qty 3

## 2024-03-22 MED ORDER — ALBUMIN HUMAN 25 % IV SOLN
25.0000 g | Freq: Once | INTRAVENOUS | Status: AC
  Administered 2024-03-22: 12.5 g via INTRAVENOUS
  Filled 2024-03-22: qty 100

## 2024-03-22 MED ORDER — FUROSEMIDE 10 MG/ML IJ SOLN
20.0000 mg | Freq: Once | INTRAMUSCULAR | Status: AC
  Administered 2024-03-22: 20 mg via INTRAVENOUS
  Filled 2024-03-22: qty 2

## 2024-03-22 MED ORDER — DM-GUAIFENESIN ER 30-600 MG PO TB12
1.0000 | ORAL_TABLET | Freq: Two times a day (BID) | ORAL | Status: DC
  Administered 2024-03-22 – 2024-04-03 (×25): 1 via ORAL
  Filled 2024-03-22 (×25): qty 1

## 2024-03-22 NOTE — Telephone Encounter (Signed)
 Caregiver, Candis called and left vm yesterday and requested a call back for Josh. Caregiver has additional questions for [employer]

## 2024-03-22 NOTE — Progress Notes (Signed)
 ANTICOAGULATION CONSULT NOTE  Pharmacy Consult for heparin  infusion Indication: NSTEMI and new onset Afib RVR with PE  Allergies  Allergen Reactions   Ezetimibe Other (See Comments)    Body aches, and stiffness   Codeine Diarrhea   Cortisone Swelling    injections   Lovastatin Other (See Comments)    Muscle cramps    Patient Measurements: Height: 5' (152.4 cm) Weight: 78.2 kg (172 lb 6.4 oz) IBW/kg (Calculated) : 45.5 HEPARIN  DW (KG): 62.2  Vital Signs: Temp: 98.2 F (36.8 C) (11/17 1932) Temp Source: Oral (11/17 1932) BP: 89/61 (11/18 0000) Pulse Rate: 108 (11/18 0000)  Labs: Recent Labs    03/19/24 0326 03/19/24 1123 03/20/24 0624 03/20/24 1509 03/21/24 0451 03/21/24 1519 03/21/24 2345  HGB 8.8*  --  9.1*  --  8.0*  --   --   HCT 27.1*  --  28.3*  --  26.2*  --   --   PLT 410*  --  457*  --  446*  --   --   HEPARINUNFRC 0.30   < > 0.29*   < > 0.28* 0.37 0.57  CREATININE 1.14*  --  0.71  --  0.63  --   --    < > = values in this interval not displayed.    Estimated Creatinine Clearance: 52.8 mL/min (by C-G formula based on SCr of 0.63 mg/dL).   Medical History: Past Medical History:  Diagnosis Date   Acid reflux    Anemia    Anesthesia complication    Tachycardia previously, now on metoprolol    Arthritis    09/02/2019: per patient have it real bad in both hands and back   Difficult intubation    had to terminate intubation unable to advance tube for cholecystectomy 2017?   DVT (deep venous thrombosis) (HCC)    leg   Hypertension    Sciatica    09/02/2019: has had it for about 3 years    Assessment: Pt is a 78 yo female presenting to ED c/o SOB and fall, found with new onset Afib , found with elevated Troponin level trending up and BNP.  Goal of Therapy:  Heparin  level 0.3-0.7 units/ml Monitor platelets by anticoagulation protocol: Yes  11/15 0326 HL 0.30, therapeutic x 1 11/15 1123 HL 0.25, subtherapeutic@950  units/hr 11/15 2007 HL 0.26,  subtherapeutic @ 1100 units/hr 11/16 0624 HL 0.29, subtherapeutic 11/16 1509 HL 0.32, therapeutic x 1 11/17 0001 HL 0.30, therapeutic x 2 11/17 0451 HL 0.28, subtherapeutic 11/17 1519 HL 0.37, therapeutic x 1 11/17 2345 HL 0.57, therapeutic X 2    Plan:  11/17:  HL @ 2345 = 0.57, therapeutic X 2 - Will continue pt on current rate and recheck HL on 11/18 with AM labs  Daily CBC while on heparin   Keith Felten D, PharmD 03/22/2024 12:25 AM

## 2024-03-22 NOTE — Progress Notes (Signed)
 ANTICOAGULATION CONSULT NOTE  Pharmacy Consult for heparin  infusion Indication: NSTEMI and new onset Afib RVR with PE  Allergies  Allergen Reactions   Ezetimibe Other (See Comments)    Body aches, and stiffness   Codeine Diarrhea   Cortisone Swelling    injections   Lovastatin Other (See Comments)    Muscle cramps    Patient Measurements: Height: 5' (152.4 cm) Weight: 68.3 kg (150 lb 9.2 oz) IBW/kg (Calculated) : 45.5 HEPARIN  DW (KG): 62.2  Vital Signs: Temp: 97.8 F (36.6 C) (11/18 0430) Temp Source: Axillary (11/18 0430) BP: 99/67 (11/18 0500) Pulse Rate: 108 (11/18 0500)  Labs: Recent Labs    03/20/24 0624 03/20/24 1509 03/21/24 0451 03/21/24 1519 03/21/24 2345 03/22/24 0442  HGB 9.1*  --  8.0*  --   --  8.5*  HCT 28.3*  --  26.2*  --   --  27.4*  PLT 457*  --  446*  --   --  414*  HEPARINUNFRC 0.29*   < > 0.28* 0.37 0.57 0.62  CREATININE 0.71  --  0.63  --   --  0.87   < > = values in this interval not displayed.    Estimated Creatinine Clearance: 45.2 mL/min (by C-G formula based on SCr of 0.87 mg/dL).   Medical History: Past Medical History:  Diagnosis Date   Acid reflux    Anemia    Anesthesia complication    Tachycardia previously, now on metoprolol    Arthritis    09/02/2019: per patient have it real bad in both hands and back   Difficult intubation    had to terminate intubation unable to advance tube for cholecystectomy 2017?   DVT (deep venous thrombosis) (HCC)    leg   Hypertension    Sciatica    09/02/2019: has had it for about 3 years    Assessment: Pt is a 78 yo female presenting to ED c/o SOB and fall, found with new onset Afib , found with elevated Troponin level trending up and BNP.  Goal of Therapy:  Heparin  level 0.3-0.7 units/ml Monitor platelets by anticoagulation protocol: Yes  11/15 0326 HL 0.30, therapeutic x 1 11/15 1123 HL 0.25, subtherapeutic@950  units/hr 11/15 2007 HL 0.26, subtherapeutic @ 1100 units/hr 11/16  0624 HL 0.29, subtherapeutic 11/16 1509 HL 0.32, therapeutic x 1 11/17 0001 HL 0.30, therapeutic x 2 11/17 0451 HL 0.28, subtherapeutic 11/17 1519 HL 0.37, therapeutic x 1 11/17 2345 HL 0.57, therapeutic X 2  11/18 0442 HL 0.62, therapeutic X 3   Plan:  11/18:  HL @ 0442 = 0.62, therapeutic X 3  - Will continue pt on current rate and recheck HL on 11/19 with AM labs  Daily CBC while on heparin   Kadesia Robel D, PharmD 03/22/2024 5:30 AM

## 2024-03-22 NOTE — Plan of Care (Signed)
 This patient remains on AR-ICU/SDU as of time of writing. The patient is AA+Ox4. The patient is currently requiring HHFNC at 55 L/min at 75% FiO2. The patient is in SR with frequent PVCs by my measurement. Right chest wall port is accessed. Foley catheter remains in place. The patient is continuing to receive continuous infusions of Amiodarone and heparin  via PIVs.    Problem: Education: Goal: Knowledge of General Education information will improve Description: Including pain rating scale, medication(s)/side effects and non-pharmacologic comfort measures Outcome: Progressing   Problem: Health Behavior/Discharge Planning: Goal: Ability to manage health-related needs will improve Outcome: Progressing   Problem: Clinical Measurements: Goal: Ability to maintain clinical measurements within normal limits will improve Outcome: Progressing Goal: Will remain free from infection Outcome: Progressing Goal: Diagnostic test results will improve Outcome: Progressing Goal: Respiratory complications will improve Outcome: Progressing Goal: Cardiovascular complication will be avoided Outcome: Progressing   Problem: Activity: Goal: Risk for activity intolerance will decrease Outcome: Progressing   Problem: Nutrition: Goal: Adequate nutrition will be maintained Outcome: Progressing   Problem: Coping: Goal: Level of anxiety will decrease Outcome: Progressing   Problem: Elimination: Goal: Will not experience complications related to bowel motility Outcome: Progressing Goal: Will not experience complications related to urinary retention Outcome: Progressing   Problem: Pain Managment: Goal: General experience of comfort will improve and/or be controlled Outcome: Progressing   Problem: Safety: Goal: Ability to remain free from injury will improve Outcome: Progressing   Problem: Skin Integrity: Goal: Risk for impaired skin integrity will decrease Outcome: Progressing   Problem:  Education: Goal: Knowledge of disease or condition will improve Outcome: Progressing Goal: Understanding of medication regimen will improve Outcome: Progressing Goal: Individualized Educational Video(s) Outcome: Progressing   Problem: Activity: Goal: Ability to tolerate increased activity will improve Outcome: Progressing   Problem: Cardiac: Goal: Ability to achieve and maintain adequate cardiopulmonary perfusion will improve Outcome: Progressing   Problem: Health Behavior/Discharge Planning: Goal: Ability to safely manage health-related needs after discharge will improve Outcome: Progressing   Problem: Education: Goal: Knowledge of the prescribed therapeutic regimen will improve Outcome: Progressing   Problem: Activity: Goal: Ability to implement measures to reduce episodes of fatigue will improve Outcome: Progressing   Problem: Bowel/Gastric: Goal: Will not experience complications related to bowel motility Outcome: Progressing   Problem: Coping: Goal: Ability to identify and develop effective coping behavior will improve Outcome: Progressing   Problem: Nutritional: Goal: Maintenance of adequate nutrition will improve Outcome: Progressing

## 2024-03-22 NOTE — Progress Notes (Signed)
 Palliative Medicine [facility] at [facility] [phone] [phone]   Name: Hannah Yu Date: 03/22/2024 MRN: [id number]  DOB: 12/25/1945  Patient Care Team: Epifanio Alm SQUIBB, MD as PCP - General (Infectious Diseases) Verdene Gills, RN as Oncology Nurse Navigator Lenn Aran, MD as Consulting Physician (Radiation Oncology) Jacobo Evalene PARAS, MD as Consulting Physician (Oncology)    REASON FOR CONSULTATION: Hannah Yu is a 78 y.o. female with multiple medical problems including stage IVb adenocarcinoma of the lung on treatment with CarboTaxol plus Bev.  Patient mid to the hospital with respiratory failure.  Found to have PE and right heart dysfunction on echo. Palliative care was consulted to address goals.   CODE STATUS: DNR  PAST MEDICAL HISTORY: Past Medical History:  Diagnosis Date   Acid reflux    Anemia    Anesthesia complication    Tachycardia previously, now on metoprolol    Arthritis    09/02/2019: per patient have it real bad in both hands and back   Difficult intubation    had to terminate intubation unable to advance tube for cholecystectomy 2017?   DVT (deep venous thrombosis) (HCC)    leg   Hypertension    Sciatica    09/02/2019: has had it for about 3 years    PAST SURGICAL HISTORY:  Past Surgical History:  Procedure Laterality Date   ABDOMINAL HYSTERECTOMY     CATARACT EXTRACTION W/ INTRAOCULAR LENS IMPLANT Bilateral 2007   eyes done within 1 month of each other.   IR IMAGING GUIDED PORT INSERTION  05/19/2023   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   TOTAL HIP ARTHROPLASTY Right 09/06/2019   Procedure: RIGHT TOTAL HIP ARTHROPLASTY ANTERIOR APPROACH;  Surgeon: Vernetta Lonni GRADE, MD;  Location: [facility] OR;  Service: Orthopedics;  Laterality: Right;   TOTAL HIP ARTHROPLASTY Left 07/31/2020   Procedure: LEFT TOTAL HIP  ARTHROPLASTY ANTERIOR APPROACH;  Surgeon: Vernetta Lonni GRADE, MD;  Location: [facility] OR;  Service: Orthopedics;  Laterality: Left;   VIDEO BRONCHOSCOPY WITH ENDOBRONCHIAL ULTRASOUND N/A 10/06/2022   Procedure: VIDEO BRONCHOSCOPY WITH ENDOBRONCHIAL ULTRASOUND;  Surgeon: Parris Manna, MD;  Location: [facility] ORS;  Service: Thoracic;  Laterality: N/A;    HEMATOLOGY/ONCOLOGY HISTORY:  Oncology History  Adenocarcinoma of left lung (HCC)  03/19/2023 Initial Diagnosis   Adenocarcinoma of left lung (HCC)   03/19/2023 Cancer Staging   Staging form: Lung, AJCC 8th Edition - Clinical stage from 03/19/2023: Stage IVB (cTX, cN2, cM1c) - Signed by Jacobo Evalene PARAS, MD on 03/19/2023 Stage prefix: Initial diagnosis   05/07/2023 - 07/16/2023 Chemotherapy   Patient is on Treatment Plan : LUNG NSCLC Pembrolizumab  (200) q21d     02/03/2024 -  Chemotherapy   Patient is on Treatment Plan : LUNG NSCLC Carboplatin + Paclitaxel + Bevacizumab q21d       ALLERGIES:  is allergic to ezetimibe, codeine, cortisone, and lovastatin.  MEDICATIONS:  Current Facility-Administered Medications  Medication Dose Route Frequency Provider Last Rate Last Admin   acetaminophen  (TYLENOL ) tablet 650 mg  650 mg Oral Q6H PRN Nelson, Dana G, NP   650 mg at 03/21/24 1817   ALPRAZolam SHEFFIELD) tablet 0.5 mg  0.5 mg Oral QHS Nelson, Dana G, NP   0.5 mg at 03/21/24 2114   alum & mag hydroxide-simeth (MAALOX/MYLANTA) 200-200-20 MG/5ML suspension 15 mL  15 mL Oral Q6H PRN Nelson, Dana G, NP   15 mL at 03/22/24  0945   amiodarone (NEXTERONE PREMIX) 360-4.14 MG/200ML-% (1.8 mg/mL) IV infusion  30 mg/hr Intravenous Continuous Hudson, Caralyn, PA-C 16.67 mL/hr at 03/22/24 1300 30 mg/hr at 03/22/24 1300   azithromycin (ZITHROMAX) 500 mg in sodium chloride  0.9 % 250 mL IVPB  500 mg Intravenous Q24H Nelson, Dana G, NP       bisacodyl (DULCOLAX) suppository 10 mg  10 mg Rectal Daily PRN Bousman, Karlie, PA-C       cefTRIAXone (ROCEPHIN) 2 g in  sodium chloride  0.9 % 100 mL IVPB  2 g Intravenous Q24H Nelson, Dana G, NP   Stopped at 03/21/24 1344   cefTRIAXone (ROCEPHIN) 2 g in sodium chloride  0.9 % 100 mL IVPB  2 g Intravenous Q24H Ponnala, Shruthi, MD       Chlorhexidine  Gluconate Cloth 2 % PADS 6 each  6 each Topical Daily Kathrene Almarie Bake, NP   6 each at 03/22/24 0946   dextromethorphan-guaiFENesin (MUCINEX DM) 30-600 MG per 12 hr tablet 1 tablet  1 tablet Oral BID Ponnala, Shruthi, MD   1 tablet at 03/22/24 0946   docusate sodium  (COLACE) capsule 100 mg  100 mg Oral BID PRN Kathrene Almarie Bake, NP   100 mg at 03/22/24 0945   DULoxetine (CYMBALTA) DR capsule 30 mg  30 mg Oral Daily Dgayli, Khabib, MD   30 mg at 03/22/24 0945   feeding supplement (BOOST / RESOURCE BREEZE) liquid 1 Container  1 Container Oral TID BM Isadora Hose, MD   1 Container at 03/22/24 0946   heparin  ADULT infusion 100 units/mL (25000 units/250mL)  1,550 Units/hr Intravenous Continuous Dail Rankin RAMAN, RPH 15.5 mL/hr at 03/22/24 1300 1,550 Units/hr at 03/22/24 1300   magic mouthwash w/lidocaine   5 mL Oral QID Isadora Hose, MD   5 mL at 03/22/24 0947   oxyCODONE  (Oxy IR/ROXICODONE ) immediate release tablet 5-10 mg  5-10 mg Oral Q4H PRN Kathrene Almarie Bake, NP   10 mg at 03/21/24 2113   polyethylene glycol (MIRALAX / GLYCOLAX) packet 17 g  17 g Oral Daily PRN Kathrene Almarie Bake, NP   17 g at 03/22/24 0948   simethicone (MYLICON) chewable tablet 80 mg  80 mg Oral QID PRN Bousman, Karlie, PA-C   80 mg at 03/22/24 0948    VITAL SIGNS: BP (!) 114/57   Pulse 68   Temp 98.1 F (36.7 C) (Axillary)   Resp (!) 8   Ht 5' (1.524 m)   Wt 150 lb 9.2 oz (68.3 kg)   SpO2 99%   BMI 29.41 kg/m  Filed Weights   03/20/24 0500 03/21/24 0500 03/22/24 0439  Weight: 149 lb 0.5 oz (67.6 kg) 172 lb 6.4 oz (78.2 kg) 150 lb 9.2 oz (68.3 kg)    Estimated body mass index is 29.41 kg/m as calculated from the following:   Height as of this encounter: 5' (1.524  m).   Weight as of this encounter: 150 lb 9.2 oz (68.3 kg).  LABS: CBC:    Component Value Date/Time   WBC 16.7 (H) 03/22/2024 0442   HGB 8.5 (L) 03/22/2024 0442   HGB 10.8 (L) 03/16/2024 0830   HCT 27.4 (L) 03/22/2024 0442   PLT 414 (H) 03/22/2024 0442   PLT 411 (H) 03/16/2024 0830   MCV 79.2 (L) 03/22/2024 0442   NEUTROABS 24.0 (H) 03/18/2024 1850   LYMPHSABS 0.6 (L) 03/18/2024 1850   MONOABS 1.6 (H) 03/18/2024 1850   EOSABS 0.0 03/18/2024 1850   BASOSABS 0.1 03/18/2024 1850  Comprehensive Metabolic Panel:    Component Value Date/Time   NA 127 (L) 03/22/2024 0442   K 4.3 03/22/2024 0442   CL 92 (L) 03/22/2024 0442   CO2 25 03/22/2024 0442   BUN 19 03/22/2024 0442   CREATININE 0.87 03/22/2024 0442   CREATININE 0.69 03/16/2024 0830   GLUCOSE 124 (H) 03/22/2024 0442   CALCIUM 8.4 (L) 03/22/2024 0442   AST 26 03/21/2024 0451   AST 24 03/16/2024 0830   ALT 27 03/21/2024 0451   ALT 19 03/16/2024 0830   ALKPHOS 240 (H) 03/21/2024 0451   BILITOT 0.2 03/21/2024 0451   BILITOT 0.4 03/16/2024 0830   PROT 5.8 (L) 03/21/2024 0451   ALBUMIN  2.7 (L) 03/21/2024 0451    RADIOGRAPHIC STUDIES: DG Chest Port 1 View Result Date: 03/22/2024 EXAM: 1 VIEW(S) XRAY OF THE CHEST 03/22/2024 05:06:00 AM COMPARISON: 03/21/2024. CLINICAL HISTORY: Acute hypoxic respiratory failure (HCC) [id number] FINDINGS: LINES, TUBES AND DEVICES: Right port a cath tip at superior cavo / atrial junction. LUNGS AND PLEURA: Moderate interstitial edema, new or increased. Similar left base airspace disease. Suspect a similar tiny left pleural effusion. No pneumothorax. HEART AND MEDIASTINUM: Moderate cardiomegaly. BONES AND SOFT TISSUES: No acute osseous abnormality. IMPRESSION: 1. Cardiomegaly with new or increased moderate congestive heart failure. 2. Similar small left pleural effusion and adjacent airspace disease, which could represent atelectasis or infection. Electronically signed by: Rockey Kilts MD 03/22/2024  10:41 AM EST RP Workstation: [id number]   DG Chest Port 1 View Result Date: 03/21/2024 EXAM: 1 VIEW(S) XRAY OF THE CHEST 03/21/2024 06:56:42 PM COMPARISON: 03/21/2024 CLINICAL HISTORY: Acute respiratory failure (HCC) FINDINGS: LINES, TUBES AND DEVICES: Right chest Port-A-Cath stable in place. LUNGS AND PLEURA: Stable retrocardiac opacification. Stable small left pleural effusion. No pneumothorax. HEART AND MEDIASTINUM: Stable cardiomegaly. Aortic arch atherosclerosis. BONES AND SOFT TISSUES: No acute osseous abnormality. IMPRESSION: 1. Stable retrocardiac opacification and small left pleural effusion. 2. Stable cardiomegaly and aortic arch atherosclerosis. 3. Right chest Port-A-Cath stable in place. Electronically signed by: Dorethia Molt MD 03/21/2024 08:22 PM EST RP Workstation: [id number]   DG Chest Port 1 View Result Date: 03/21/2024 EXAM: 1 VIEW(S) XRAY OF THE CHEST 03/21/2024 09:05:00 AM COMPARISON: 03/18/2024 CLINICAL HISTORY: Acute hypoxic respiratory failure (HCC) 8228802. FINDINGS: LINES, TUBES AND DEVICES: Stable right IJ port catheter to the cavoatrial junction. LUNGS AND PLEURA: Worsening perihilar opacities. Worsening and left retrocardiac consolidation/atelectasis. Worsening linear perihilar opacities. Probable small left pleural effusion. No pneumothorax. HEART AND MEDIASTINUM: Aortic atherosclerosis (ICD-10: I70.0). BONES AND SOFT TISSUES: No acute osseous abnormality. IMPRESSION: 1. Worsening perihilar opacities and left retrocardiac consolidation/atelectasis. 2. Probable small left pleural effusion. Electronically signed by: Katheleen Faes MD 03/21/2024 09:59 AM EST RP Workstation: [id number]   DG HIP UNILAT WITH PELVIS 2-3 VIEWS LEFT Result Date: 03/20/2024 CLINICAL DATA:  Pain with recent fall. EXAM: DG HIP (WITH OR WITHOUT PELVIS) 2-3V LEFT COMPARISON:  12/17/2020, 12/21/2023. FINDINGS: Increased lucency is noted at the acetabulum on the left, concerning for metastatic disease and  seen on prior PET-CT. There are cortical irregularities involving the medial aspect of the pelvic rim at the acetabulum and inferior pubic ramus, which may be due to metastatic disease. The possibility of superimposed in fracture cannot be excluded. 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 There degenerative changes in the lower lumbar spine. IMPRESSION: 1. Increased lucency a at the acetabulum on the left, likely related to known metastatic disease. Cortical irregularities are also seen along the pelvic rim at the acetabulum on the left  and inferior pubic ramus, raising the possibility for superimposed fracture. 2. Total hip arthroplasty changes bilaterally. Electronically Signed   By: Leita Birmingham M.D.   On: 03/20/2024 17:05   ECHOCARDIOGRAM COMPLETE Result Date: 03/19/2024    ECHOCARDIOGRAM REPORT   Patient Name:   MADELL HEINO Date of Exam: 03/19/2024 Medical Rec #:  [id number]           Height:       60.0 in Accession #:    [id number]          Weight:       172.4 lb Date of Birth:  26-Mar-1946            BSA:          1.752 m Patient Age:    78 years            BP:           109/78 mmHg Patient Gender: F                   HR:           94 bpm. Exam Location:  [facility] Procedure: 2D Echo, Color Doppler and Cardiac Doppler (Both Spectral and Color            Flow Doppler were utilized during procedure). STAT ECHO Indications:     Dyspnea R06.00  History:         Patient has prior history of Echocardiogram examinations, most                  recent 09/09/2019.  Sonographer:     Thedora Louder RDCS, FASE Referring Phys:  [id number] KHABIB DGAYLI Diagnosing Phys: Annabella Scarce MD IMPRESSIONS  1. Compared with the echo in 2021, right ventricular dilation and dysfunction are new. Consistent with known finding of pulmonary embolis.  2. Left ventricular ejection fraction, by estimation, is 60 to 65%. The left ventricle has normal function. The left ventricle has no regional wall motion abnormalities.  Left ventricular diastolic parameters are consistent with Grade I diastolic dysfunction (impaired relaxation).  3. Right ventricular systolic function is moderately reduced. The right ventricular size is moderately enlarged. There is severely elevated pulmonary artery systolic pressure.  4. Right atrial size was severely dilated.  5. The mitral valve is normal in structure. No evidence of mitral valve regurgitation. No evidence of mitral stenosis.  6. 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  7. The aortic valve is tricuspid. Aortic valve regurgitation is not visualized. No aortic stenosis is present.  8. The inferior vena cava is dilated in size with <50% respiratory variability, suggesting right atrial pressure of 15 mmHg. FINDINGS  Left Ventricle: Left ventricular ejection fraction, by estimation, is 60 to 65%. The left ventricle has normal function. The left ventricle has no regional wall motion abnormalities. The left ventricular internal cavity size was normal in size. There is  no left ventricular hypertrophy. Left ventricular diastolic parameters are consistent with Grade I diastolic dysfunction (impaired relaxation). Normal left ventricular filling pressure. Right Ventricle: The right ventricular size is moderately enlarged. No increase in right ventricular wall thickness. Right ventricular systolic function is moderately reduced. There is severely elevated pulmonary artery systolic pressure. The tricuspid regurgitant velocity is 3.46 m/s, and with an assumed right atrial pressure of 15 mmHg, the estimated right ventricular systolic pressure is 62.9 mmHg. Left Atrium: Left atrial size was normal in size. Right Atrium: Right atrial size was severely dilated. Pericardium: There is no  evidence of pericardial effusion. Mitral Valve: The mitral valve is normal in structure. No evidence of mitral valve regurgitation. No evidence of mitral valve stenosis. Tricuspid Valve: The tricuspid valve is normal in  structure. 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 No evidence of tricuspid stenosis. Aortic Valve: The aortic valve is tricuspid. Aortic valve regurgitation is not visualized. No aortic stenosis is present. Aortic valve peak gradient measures 6.9 mmHg. Pulmonic Valve: The pulmonic valve was normal in structure. Pulmonic valve regurgitation is not visualized. No evidence of pulmonic stenosis. Aorta: The aortic root is normal in size and structure. Venous: The inferior vena cava is dilated in size with less than 50% respiratory variability, suggesting right atrial pressure of 15 mmHg. IAS/Shunts: No atrial level shunt detected by color flow Doppler.  LEFT VENTRICLE PLAX 2D LVIDd:         2.90 cm   Diastology LVIDs:         2.10 cm   LV e' medial:    10.00 cm/s LV IVS:        1.00 cm   LV E/e' medial:  6.9 LVOT diam:     1.90 cm   LV e' lateral:   13.60 cm/s LV SV:         30        LV E/e' lateral: 5.1 LV SV Index:   17 LVOT Area:     2.84 cm  RIGHT VENTRICLE RV Basal diam:  3.40 cm RV S prime:     9.46 cm/s TAPSE (M-mode): 1.6 cm LEFT ATRIUM           Index        RIGHT ATRIUM           Index LA Vol (A4C): 40.2 ml 22.94 ml/m  RA Area:     23.80 cm                                    RA Volume:   75.20 ml  42.91 ml/m  AORTIC VALVE                 PULMONIC VALVE AV Area (Vmax): 1.31 cm     PV Vmax:        0.76 m/s AV Vmax:        131.00 cm/s  PV Peak grad:   2.3 mmHg AV Peak Grad:   6.9 mmHg     RVOT Peak grad: 1 mmHg LVOT Vmax:      60.70 cm/s LVOT Vmean:     38.700 cm/s LVOT VTI:       0.105 m  AORTA Ao Root diam: 3.00 cm Ao Asc diam:  3.30 cm MITRAL VALVE               TRICUSPID VALVE MV Area (PHT): 2.24 cm    TR Peak grad:   47.9 mmHg MV Decel Time: 338 msec    TR Vmax:        346.00 cm/s MV E velocity: 68.70 cm/s MV A velocity: 98.00 cm/s  SHUNTS MV E/A ratio:  0.70        Systemic VTI:  0.10 m                            Systemic Diam: 1.90 cm Annabella Scarce MD Electronically signed by Annabella Scarce MD  Signature Date/Time: 03/19/2024/11:15:10 AM  Final (Updated)    US  Venous Img Lower Bilateral (DVT) Result Date: 03/19/2024 CLINICAL DATA:  Pulmonary embolism. EXAM: BILATERAL LOWER EXTREMITY VENOUS DOPPLER ULTRASOUND TECHNIQUE: Gray-scale sonography with graded compression, as well as color Doppler and duplex ultrasound were performed to evaluate the lower extremity deep venous systems from the level of the common femoral vein and including the common femoral, femoral, profunda femoral, popliteal and calf veins including the posterior tibial, peroneal and gastrocnemius veins when visible. The superficial great saphenous vein was also interrogated. Spectral Doppler was utilized to evaluate flow at rest and with distal augmentation maneuvers in the common femoral, femoral and popliteal veins. COMPARISON:  None Available. FINDINGS: RIGHT LOWER EXTREMITY Common Femoral Vein: No evidence of thrombus. Normal compressibility, respiratory phasicity and response to augmentation. Saphenofemoral Junction: No evidence of thrombus. Normal compressibility and flow on color Doppler imaging. Profunda Femoral Vein: No evidence of thrombus. Normal compressibility and flow on color Doppler imaging. Femoral Vein: No evidence of thrombus. Normal compressibility, respiratory phasicity and response to augmentation. Popliteal Vein: No evidence of thrombus. Normal compressibility, respiratory phasicity and response to augmentation. Calf Veins: Suggestion of nonocclusive thrombus in the visualized right posterior tibial and peroneal veins. Superficial Great Saphenous Vein: No evidence of thrombus. Normal compressibility. Venous Reflux:  None. Other Findings: No evidence of superficial thrombophlebitis or abnormal fluid collection. LEFT LOWER EXTREMITY Common Femoral Vein: No evidence of thrombus. Normal compressibility, respiratory phasicity and response to augmentation. Saphenofemoral Junction: No evidence of thrombus. Normal  compressibility and flow on color Doppler imaging. Profunda Femoral Vein: No evidence of thrombus. Normal compressibility and flow on color Doppler imaging. Femoral Vein: No evidence of thrombus. Normal compressibility, respiratory phasicity and response to augmentation. Popliteal Vein: No evidence of thrombus. Normal compressibility, respiratory phasicity and response to augmentation. Calf Veins: No evidence of thrombus. Normal compressibility and flow on color Doppler imaging. Superficial Great Saphenous Vein: No evidence of thrombus. Normal compressibility. Venous Reflux:  None. Other Findings: No evidence of superficial thrombophlebitis or abnormal fluid collection. IMPRESSION: 1. Suggestion of nonocclusive thrombus in the visualized right posterior tibial and peroneal veins. 2. No evidence of deep venous thrombosis in the left lower extremity. Electronically Signed   By: Marcey Moan M.D.   On: 03/19/2024 09:19   CT Angio Chest PE W and/or Wo Contrast Result Date: 03/18/2024 EXAM: CTA CHEST 03/18/2024 09:56:31 PM TECHNIQUE: CTA of the chest was performed after the administration of 75 mL of iohexol  (OMNIPAQUE ) 350 MG/ML injection. Multiplanar reformatted images are provided for review. MIP images are provided for review. Automated exposure control, iterative reconstruction, and/or weight based adjustment of the mA/kV was utilized to reduce the radiation dose to as low as reasonably achievable. COMPARISON: PET CT dated 12/21/2023 CLINICAL HISTORY: eval PE. lung cancer patient, acutely hypoxic, tachy. FINDINGS: PULMONARY ARTERIES: Pulmonary arteries are adequately opacified for evaluation. Segmental/subsegmental emboli in the right middle and lower lobe pulmonary arteries (images 57, 65, 66, and 70). Overall clot burden is small to moderate. Main pulmonary artery is normal in caliber. MEDIASTINUM: Right chest port terminates at the cavoatrial junction. Cardiomegaly. The pericardium demonstrates no acute  abnormality. Mild thoracic aortic atherosclerosis. LYMPH NODES: No mediastinal, hilar or axillary lymphadenopathy. LUNGS AND PLEURA: Multifocal patchy/nodular opacities in the left lung reflecting a combination of atelectasis and tumor, including a 2.0 cm perifissural nodule along the left fissure (image 34) and 4.5 cm pleural-based mass in the posterior left lower hemithorax (image 87). Overall grossly unchanged. Trace left pleural effusion. No pneumothorax. UPPER ABDOMEN:  Cholelithiasis. Limited images of the upper abdomen demonstrate cholelithiasis. SOFT TISSUES AND BONES: Severe compression fracture deformity at T6 (sagittal image 81), new. Known multifocal osseous metastases are not evident on CT, although hypermetabolic on PET. No acute soft tissue abnormality. IMPRESSION: 1. Acute segmental/subsegmental pulmonary emboli in the right middle and lower lobe arteries. Small to moderate clot burden. 2. New severe T6 compression fracture deformity. 3. Left lung multifocal opacities reflecting atelectasis and tumor, overall grossly unchanged. 4. Known osseous metastases are better evaluated on PET. 5. Critical Value/emergent results were called by telephone at the time of interpretation on 03/18/2024 at 2215 hrs to provider Dr Claudene. Electronically signed by: Pinkie Pebbles MD 03/18/2024 10:19 PM EST RP Workstation: [id number]   DG Chest Port 1 View Result Date: 03/18/2024 EXAM: 1 VIEW(S) XRAY OF THE CHEST 03/18/2024 07:05:00 PM COMPARISON: 07/22/2023 CLINICAL HISTORY: Questionable sepsis - evaluate for abnormality FINDINGS: LINES, TUBES AND DEVICES: Right chest wall port stable in position. LUNGS AND PLEURA: Small left pleural effusion. Retrocardiac airspace opacity. Developing left upper lobe airspace opacity. No pneumothorax. HEART AND MEDIASTINUM: Cardiomegaly, unchanged. Aortic arch atherosclerosis. BONES AND SOFT TISSUES: Rotator cuff anchor suture along the right shoulder noted. No acute osseous  abnormality. IMPRESSION: 1. Developing left upper lobe airspace opacity and retrocardiac airspace opacity, suspicious for pneumonia or aspiration. Follow-up PA and lateral chest x-ray is recommended in 3-4 weeks following therapy to ensure resolution and exclude underlying malignancy. 2. Small left pleural effusion with associated retroperitoneal airspace opacity . Electronically signed by: Morgane Naveau MD 03/18/2024 07:27 PM EST RP Workstation: [id number]    PERFORMANCE STATUS (ECOG) : 4 - Bedbound  Review of Systems Unless otherwise noted, a complete review of systems is negative.  Physical Exam General: ill appearing Cardiovascular: Irregular Pulmonary: HFNC Extremities: no edema, no joint deformities Skin: no rashes Neurological: Weakness but otherwise nonfocal  IMPRESSION: Follow-up visit.  Patient remains in ICU.  Now on oxygen via high flow nasal cannula at 40 L.  On amiodarone drip.  X-ray today suggestive of CHF.  Patient received dose of Lasix .  I met with patient's caregiver, Candis.  Together, we discussed patient's tenuous status.  She verbalized understanding that patient's oxygen requirements have significantly increased today.  We discussed several scenarios including the possibility of further decline.  She also understands that even with improvement, patient might not return to her previous baseline and performance status could limit future cancer treatment.  She is in agreement with current scope of treatment and would like to give patient time to see if she improves.  Prognosis is guarded.  PLAN: - Continue current scope of treatment - Will follow   Time Total: 25 minutes  Visit consisted of counseling and education dealing with the complex and emotionally intense issues of symptom management and palliative care in the setting of serious and potentially life-threatening illness.Greater than 50%  of this time was spent counseling and coordinating care related to the  above assessment and plan.  Signed by: Fonda Mower, PhD, NP-C

## 2024-03-22 NOTE — Progress Notes (Signed)
 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  TTE with RV strain currently on heparin  drip.  Course complicated by A-fib with RVR requiring amiodarone drip. Initially required levophed gtt which has been wean off. Cardiology following. Hospital course as below   Subjective: Patient was examined at the bedside, new to me today.  Continues to be on HHFNC, wean as able States she feels slightly better today BP soft, improved with IV albumin  Discussed updates with patient and also friend Candis who was present at the bedside  Objective: Vitals:   03/22/24 0430 03/22/24 0439 03/22/24 0500 03/22/24 0751  BP: (!) 91/57  99/67   Pulse: (!) 113  (!) 108   Resp: 15  12   Temp: 97.8 F (36.6 C)     TempSrc: Axillary     SpO2: 100%  99% 97%  Weight:  68.3 kg    Height:        Intake/Output Summary (Last 24 hours) at 03/22/2024 9166 Last data filed at 03/22/2024 0441 Gross per 24 hour  Intake 881.71 ml  Output 350 ml  Net 531.71 ml   Filed Weights   03/20/24 0500 03/21/24 0500 03/22/24 0439  Weight: 67.6 kg 78.2 kg 68.3 kg    Examination: General: Acute on chronically-ill appearing female, on HHFNC HENT: Supple, no JVD Lungs: Faint rhonchi throughout, even, non labored  Cardiovascular: Irregular irregular, no m/r/g, 2+ radial/1+ distal pulses, 1+ bilateral lower extremity edema  Abdomen: +BS x4, non distended, non tender   Extremities: Normal bulk and tone, moves all extremities  Neuro: Alert and oriented, follows commands GU: Indwelling foley catheter draining yellow urine   Assessment & Plan:  Acute segmental PE - US  shows nonocclusive thrombus in right posterior tibial and peroneal vein - IR consulted for acute segmental PE clot burden minimal and thrombolysis contraindicated due to brain metastasis   - Continue heparin  gtt, change to Eliquis when stable  Possible pneumonia - RVP negative, MRSA nares negative - Blood culture 11/14 NGTD - Continue IV ceftriaxone, azithromycin - Trend leukocytosis  Acute hypoxic respiratory failure  - Suspect likely due to acute segmental PE, possible pneumonia, A fib, volume overload - Was changed from BiPAP to HFNC, 8L and downgraded to TRH - On 11/17, patient was hypoxic and was placed on HHFNC at 95% FiO2, 40 L - Slowly improving, now at 50% FiO2, 40 L - Wean oxygen as able  Atrial fibrillation with RVR Cardiogenic shock, pulmonary HTN - Echo 03/19/24: EF 60 to 65%, grade I diastolic dysfunction, right ventricle size moderately enlarged, severely elevated pulmonary artery systolic pressures, severe tricuspid valve regurgitation - Continue IV amiodarone, s/p IV albumin , IV Lasix  - IV digoxin due to soft blood pressure, hold metoprolol  - Continuous telemetry monitoring   Metastatic adenocarcinoma  - Seen by oncology, appreciate recs - New severe T6 compression fracture - XR hip increased lucency at the acetabulum on the left, likely related to metastatic disease.  Possibility  of superimposed fracture - Oncology and Palliative care following  Hyponatremia  - Sodium improved from 117 on presentation, slowly up to 127. Unclear etiology - send serum osm, urine osm, urine Na - Monitor BMP  AKI - resolved - Monitor Cr  - PT/OT eval  DVT prophylaxis: IV Heparin    Code Status: Limited: Do not attempt resuscitation (DNR) -DNR-LIMITED -Do Not Intubate/DNI   Disposition:  TBD  Consultants:  PCCM Cardiology  Antimicrobials:  Anti-infectives (From admission, onward)    Start     Dose/Rate Route Frequency Ordered Stop   03/20/24 0600  vancomycin (VANCOREADY) IVPB 750 mg/150 mL  Status:  Discontinued       Placed in Followed by Linked Group   750 mg 150 mL/hr over 60 Minutes Intravenous Every 24 hours 03/19/24 0530 03/19/24 1049   03/19/24 1815  azithromycin (ZITHROMAX) 500 mg in sodium chloride  0.9 % 250 mL IVPB        500 mg 250 mL/hr over 60 Minutes Intravenous Every 24 hours 03/19/24 1716     03/19/24 1400  cefTRIAXone (ROCEPHIN) 2 g in sodium chloride  0.9 % 100 mL IVPB        2 g 200 mL/hr over 30 Minutes Intravenous Every 24 hours 03/19/24 1049     03/19/24 0800  ceFEPIme (MAXIPIME) 2 g in sodium chloride  0.9 % 100 mL IVPB  Status:  Discontinued        2 g 200 mL/hr over 30 Minutes Intravenous Every 12 hours 03/19/24 0523 03/19/24 1049   03/19/24 0615  vancomycin (VANCOREADY) IVPB 1750 mg/350 mL       Placed in Followed by Linked Group   1,750 mg 175 mL/hr over 120 Minutes Intravenous  Once 03/19/24 0530 03/19/24 0812   03/18/24 1945  cefTRIAXone (ROCEPHIN) 2 g in sodium chloride  0.9 % 100 mL IVPB        2 g 200 mL/hr over 30 Minutes Intravenous  Once 03/18/24 1930 03/18/24 2202   03/18/24 1945  azithromycin (ZITHROMAX) 500 mg in sodium chloride  0.9 % 250 mL IVPB        500 mg 250 mL/hr over 60 Minutes Intravenous  Once 03/18/24 1930 03/19/24 0001       Data Reviewed: I have personally reviewed following labs and imaging studies CBC: Recent Labs  Lab 03/16/24 0830 03/18/24 1850 03/19/24 0326 03/20/24 0624 03/21/24 0451 03/22/24 0442  WBC 17.7* 27.3* 31.2* 20.6* 15.1* 16.7*  NEUTROABS 14.5* 24.0*  --   --   --   --   HGB 10.8* 9.9* 8.8* 9.1* 8.0* 8.5*  HCT 32.7* 30.7* 27.1* 28.3* 26.2* 27.4*  MCV 74.1* 76.2* 75.7* 77.7* 79.6* 79.2*  PLT 411* 465* 410* 457* 446* 414*   Basic Metabolic Panel: Recent Labs  Lab  03/18/24 1850 03/19/24 0326 03/20/24 0624 03/21/24 0451 03/22/24 0442  NA 117* 120* 129* 129* 127*  K 5.0 4.6 4.2 4.4 4.3  CL 80* 87* 92* 95* 92*  CO2 21* 20* 22 25 25   GLUCOSE 195* 165* 103* 112* 124*  BUN 35* 37* 25* 17 19  CREATININE 1.05* 1.14* 0.71 0.63 0.87  CALCIUM 8.6* 7.7* 8.5* 8.5* 8.4*  MG 2.1 2.0 2.1  --  2.1  PHOS  --  4.5 3.6  --  3.0   GFR: Estimated Creatinine Clearance: 45.2 mL/min (by C-G formula based on SCr of 0.87 mg/dL). Liver Function Tests: Recent Labs  Lab 03/16/24 0830 03/18/24 1850 03/21/24 0451  AST 24 54* 26  ALT 19  30 27  ALKPHOS 173* 228* 240*  BILITOT 0.4 0.3 0.2  PROT 6.8 6.7 5.8*  ALBUMIN  2.5* 3.0* 2.7*   CBG: Recent Labs  Lab 03/19/24 0015  GLUCAP 173*    Recent Results (from the past 240 hours)  Blood Culture (routine x 2)     Status: None (Preliminary result)   Collection Time: 03/18/24  6:00 PM   Specimen: BLOOD  Result Value Ref Range Status   Specimen Description BLOOD RIGHT ANTECUBITAL  Final   Special Requests   Final    BOTTLES DRAWN AEROBIC AND ANAEROBIC Blood Culture results may not be optimal due to an inadequate volume of blood received in culture bottles   Culture   Final    NO GROWTH 4 DAYS Performed at [facility], [address] [place], [place] [address]    Report Status PENDING  Incomplete  Blood Culture (routine x 2)     Status: None (Preliminary result)   Collection Time: 03/18/24  6:05 PM   Specimen: BLOOD  Result Value Ref Range Status   Specimen Description BLOOD BLOOD LEFT HAND  Final   Special Requests   Final    BOTTLES DRAWN AEROBIC AND ANAEROBIC Blood Culture results may not be optimal due to an inadequate volume of blood received in culture bottles   Culture   Final    NO GROWTH 4 DAYS Performed at [facility], [address], [place], [place] [address]    Report Status PENDING  Incomplete  MRSA Next Gen by PCR, Nasal     Status: None   Collection Time: 03/19/24   5:04 AM   Specimen: Anterior Nasal Swab  Result Value Ref Range Status   MRSA by PCR Next Gen NOT DETECTED NOT DETECTED Final    Comment: (NOTE) The GeneXpert MRSA Assay (FDA approved for NASAL specimens only), is one component of a comprehensive MRSA colonization surveillance program. It is not intended to diagnose MRSA infection nor to guide or monitor treatment for MRSA infections. Test performance is not FDA approved in patients less than 36 years old. Performed at [facility], [address] [place], [place] [address]   Resp panel by RT-PCR (RSV, Flu A&B, Covid) Anterior Nasal Swab     Status: None   Collection Time: 03/19/24  5:04 AM   Specimen: Anterior Nasal Swab  Result Value Ref Range Status   SARS Coronavirus 2 by RT PCR NEGATIVE NEGATIVE Final    Comment: (NOTE) SARS-CoV-2 target nucleic acids are NOT DETECTED.  The SARS-CoV-2 RNA is generally detectable in upper respiratory specimens during the acute phase of infection. The lowest concentration of SARS-CoV-2 viral copies this assay can detect is 138 copies/mL. A negative result does not preclude SARS-Cov-2 infection and should not be used as the sole basis for treatment or other patient management decisions. A negative result may occur with  improper specimen collection/handling, submission of specimen other than nasopharyngeal swab, presence of viral mutation(s) within the areas targeted by this assay, and inadequate number of viral copies(<138 copies/mL). A negative result must be combined with clinical observations, patient history, and epidemiological information. The expected result is Negative.  Fact Sheet for Patients:  bloggercourse.com  Fact Sheet for Healthcare Providers:  seriousbroker.it  This test is no t yet approved or cleared by the [place]  FDA and  has been authorized for detection and/or diagnosis of SARS-CoV-2 by FDA under an  Emergency Use Authorization (EUA). This EUA will remain  in effect (  meaning this test can be used) for the duration of the COVID-19 declaration under Section 564(b)(1) of the Act, 21 U.S.C.section 360bbb-3(b)(1), unless the authorization is terminated  or revoked sooner.       Influenza A by PCR NEGATIVE NEGATIVE Final   Influenza B by PCR NEGATIVE NEGATIVE Final    Comment: (NOTE) The Xpert Xpress SARS-CoV-2/FLU/RSV plus assay is intended as an aid in the diagnosis of influenza from Nasopharyngeal swab specimens and should not be used as a sole basis for treatment. Nasal washings and aspirates are unacceptable for Xpert Xpress SARS-CoV-2/FLU/RSV testing.  Fact Sheet for Patients: bloggercourse.com  Fact Sheet for Healthcare Providers: seriousbroker.it  This test is not yet approved or cleared by the [place]  FDA and has been authorized for detection and/or diagnosis of SARS-CoV-2 by FDA under an Emergency Use Authorization (EUA). This EUA will remain in effect (meaning this test can be used) for the duration of the COVID-19 declaration under Section 564(b)(1) of the Act, 21 U.S.C. section 360bbb-3(b)(1), unless the authorization is terminated or revoked.     Resp Syncytial Virus by PCR NEGATIVE NEGATIVE Final    Comment: (NOTE) Fact Sheet for Patients: bloggercourse.com  Fact Sheet for Healthcare Providers: seriousbroker.it  This test is not yet approved or cleared by the [place]  FDA and has been authorized for detection and/or diagnosis of SARS-CoV-2 by FDA under an Emergency Use Authorization (EUA). This EUA will remain in effect (meaning this test can be used) for the duration of the COVID-19 declaration under Section 564(b)(1) of the Act, 21 U.S.C. section 360bbb-3(b)(1), unless the authorization is terminated or revoked.  Performed at [facility], [address] [place], [place] [address]   Respiratory (~20 pathogens) panel by PCR     Status: None   Collection Time: 03/21/24  9:37 AM   Specimen: Nasopharyngeal Swab; Respiratory  Result Value Ref Range Status   Adenovirus NOT DETECTED NOT DETECTED Final   Coronavirus 229E NOT DETECTED NOT DETECTED Final    Comment: (NOTE) The Coronavirus on the Respiratory Panel, DOES NOT test for the novel  Coronavirus (2019 nCoV)    Coronavirus HKU1 NOT DETECTED NOT DETECTED Final   Coronavirus NL63 NOT DETECTED NOT DETECTED Final   Coronavirus OC43 NOT DETECTED NOT DETECTED Final   Metapneumovirus NOT DETECTED NOT DETECTED Final   Rhinovirus / Enterovirus NOT DETECTED NOT DETECTED Final   Influenza A NOT DETECTED NOT DETECTED Final   Influenza B NOT DETECTED NOT DETECTED Final   Parainfluenza Virus 1 NOT DETECTED NOT DETECTED Final   Parainfluenza Virus 2 NOT DETECTED NOT DETECTED Final   Parainfluenza Virus 3 NOT DETECTED NOT DETECTED Final   Parainfluenza Virus 4 NOT DETECTED NOT DETECTED Final   Respiratory Syncytial Virus NOT DETECTED NOT DETECTED Final   Bordetella pertussis NOT DETECTED NOT DETECTED Final   Bordetella Parapertussis NOT DETECTED NOT DETECTED Final   Chlamydophila pneumoniae NOT DETECTED NOT DETECTED Final   Mycoplasma pneumoniae NOT DETECTED NOT DETECTED Final    Comment: Performed at [facility], [address], [place], [place] [address]     Radiology Studies: DG Chest Port 1 View Result Date: 03/21/2024 EXAM: 1 VIEW(S) XRAY OF THE CHEST 03/21/2024 06:56:42 PM COMPARISON: 03/21/2024 CLINICAL HISTORY: Acute respiratory failure (HCC) FINDINGS: LINES, TUBES AND DEVICES: Right chest Port-A-Cath stable in place. LUNGS AND PLEURA: Stable retrocardiac opacification. Stable small left pleural effusion. No pneumothorax. HEART AND MEDIASTINUM: Stable cardiomegaly. Aortic arch atherosclerosis. BONES AND SOFT TISSUES: No  acute osseous abnormality. IMPRESSION: 1.  Stable retrocardiac opacification and small left pleural effusion. 2. Stable cardiomegaly and aortic arch atherosclerosis. 3. Right chest Port-A-Cath stable in place. Electronically signed by: Dorethia Molt MD 03/21/2024 08:22 PM EST RP Workstation: [id number]   DG Chest Port 1 View Result Date: 03/21/2024 EXAM: 1 VIEW(S) XRAY OF THE CHEST 03/21/2024 09:05:00 AM COMPARISON: 03/18/2024 CLINICAL HISTORY: Acute hypoxic respiratory failure (HCC) [id number]. FINDINGS: LINES, TUBES AND DEVICES: Stable right IJ port catheter to the cavoatrial junction. LUNGS AND PLEURA: Worsening perihilar opacities. Worsening and left retrocardiac consolidation/atelectasis. Worsening linear perihilar opacities. Probable small left pleural effusion. No pneumothorax. HEART AND MEDIASTINUM: Aortic atherosclerosis (ICD-10: I70.0). BONES AND SOFT TISSUES: No acute osseous abnormality. IMPRESSION: 1. Worsening perihilar opacities and left retrocardiac consolidation/atelectasis. 2. Probable small left pleural effusion. Electronically signed by: Katheleen Faes MD 03/21/2024 09:59 AM EST RP Workstation: [id number]   DG HIP UNILAT WITH PELVIS 2-3 VIEWS LEFT Result Date: 03/20/2024 CLINICAL DATA:  Pain with recent fall. EXAM: DG HIP (WITH OR WITHOUT PELVIS) 2-3V LEFT COMPARISON:  12/17/2020, 12/21/2023. FINDINGS: Increased lucency is noted at the acetabulum on the left, concerning for metastatic disease and seen on prior PET-CT. There are cortical irregularities involving the medial aspect of the pelvic rim at the acetabulum and inferior pubic ramus, which may be due to metastatic disease. The possibility of superimposed in fracture cannot be excluded. 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 There degenerative changes in the lower lumbar spine. IMPRESSION: 1. Increased lucency a at the acetabulum on the left, likely related to known metastatic disease. Cortical irregularities are also seen along the pelvic rim at the acetabulum on  the left and inferior pubic ramus, raising the possibility for superimposed fracture. 2. Total hip arthroplasty changes bilaterally. Electronically Signed   By: Leita Birmingham M.D.   On: 03/20/2024 17:05    Scheduled Meds:  ALPRAZolam  0.5 mg Oral QHS   Chlorhexidine  Gluconate Cloth  6 each Topical Daily   DULoxetine  30 mg Oral Daily   feeding supplement  1 Container Oral TID BM   magic mouthwash w/lidocaine   5 mL Oral QID   Continuous Infusions:  amiodarone 30 mg/hr (03/21/24 2141)   azithromycin (ZITHROMAX) 500 mg in sodium chloride  0.9 % 250 mL IVPB 500 mg (03/21/24 1731)   cefTRIAXone (ROCEPHIN)  IV Stopped (03/21/24 1344)   heparin  1,550 Units/hr (03/21/24 2344)     LOS: 4 days  MDM: Patient is high risk for one or more organ failure.  They necessitate ongoing hospitalization for continued IV therapies and subsequent lab monitoring. Total time spent interpreting labs and vitals, reviewing the medical record, coordinating care amongst consultants and care team members, directly assessing and discussing care with the patient and/or family: 55 min Laree Lock, MD [facility]  To contact the attending physician between 7A-7P please use Epic Chat. To contact the covering physician during after hours 7P-7A, please review Amion.  03/22/2024, 8:33 AM   *This document has been created with the assistance of dictation software. Please excuse typographical errors. *

## 2024-03-22 NOTE — Progress Notes (Signed)
 [facility] CLINIC CARDIOLOGY PROGRESS NOTE   Patient ID: Hannah Yu MRN: [id number] DOB/AGE: 1946/01/18 78 y.o.  Admit date: 03/18/2024 Referring Physician Dr. Isadora Primary Physician Epifanio Alm SQUIBB, MD  Primary Cardiologist None Reason for Consultation PE, RV dysfunction  HPI: Hannah Yu is a 78 y.o. 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  Found to have acute segmental right PE, no evidence of right heart strain on CT.  Echo did reveal RV dysfunction.  Cardiology was consulted for further evaluation.   Interval History:  - Patient seen and examined this morning, resting in hospital bed with family at bedside. - She states that she feels overall well today. - She denies any chest pain or palpitation symptoms.  HR improved from yesterday but still elevated.  Review of systems complete and found to be negative unless listed above   Vitals:   03/22/24 0430 03/22/24 0439 03/22/24 0500 03/22/24 0751  BP: (!) 91/57  99/67   Pulse: (!) 113  (!) 108   Resp: 15  12   Temp: 97.8 F (36.6 C)     TempSrc: Axillary     SpO2: 100%  99% 97%  Weight:  68.3 kg    Height:         Intake/Output Summary (Last 24 hours) at 03/22/2024 0825 Last data filed at 03/22/2024 0441 Gross per 24 hour  Intake 881.71 ml  Output 350 ml  Net 531.71 ml     PHYSICAL EXAM General: Chronically ill-appearing elderly female, well nourished, in no acute distress. HEENT: Normocephalic and atraumatic. Neck: No JVD.  Lungs: Normal respiratory effort on HFNC. Clear bilaterally to auscultation. No wheezes, crackles, rhonchi.  Heart: Irregularly irregular, elevated rate. Normal S1 and S2 without  gallops or murmurs. Radial & DP pulses 2+ bilaterally. Abdomen: Non-distended appearing.  Msk: Normal strength and tone for age. Extremities: No clubbing, cyanosis or edema.   Neuro: Alert and oriented X 3. Psych: Mood appropriate, affect congruent.    LABS: Basic Metabolic Panel: Recent Labs    03/20/24 0624 03/21/24 0451 03/22/24 0442  NA 129* 129* 127*  K 4.2 4.4 4.3  CL 92* 95* 92*  CO2 22 25 25   GLUCOSE 103* 112* 124*  BUN 25* 17 19  CREATININE 0.71 0.63 0.87  CALCIUM 8.5* 8.5* 8.4*  MG 2.1  --  2.1  PHOS 3.6  --  3.0   Liver Function Tests: Recent Labs    03/21/24 0451  AST 26  ALT 27  ALKPHOS 240*  BILITOT 0.2  PROT 5.8*  ALBUMIN  2.7*   No results for input(s): LIPASE, AMYLASE in the last 72 hours. CBC: Recent Labs    03/21/24 0451 03/22/24 0442  WBC 15.1* 16.7*  HGB 8.0* 8.5*  HCT 26.2* 27.4*  MCV 79.6* 79.2*  PLT 446* 414*   Cardiac Enzymes: No results for input(s): CKTOTAL, CKMB, CKMBINDEX, TROPONINIHS in the last 72 hours. BNP: No results for input(s): BNP in the last 72 hours. D-Dimer: No results for input(s): DDIMER in the last 72 hours. Hemoglobin A1C: No results for input(s): HGBA1C in the last 72 hours. Fasting Lipid Panel: No results for input(s): CHOL, HDL, LDLCALC, TRIG, CHOLHDL, LDLDIRECT in the last 72 hours. Thyroid  Function Tests: No  results for input(s): TSH, T4TOTAL, T3FREE, THYROIDAB in the last 72 hours.  Invalid input(s): FREET3 Anemia Panel: No results for input(s): VITAMINB12, FOLATE, FERRITIN, TIBC, IRON, RETICCTPCT in the last 72 hours.  DG Chest Port 1 View Result Date: 03/21/2024 EXAM: 1 VIEW(S) XRAY OF THE CHEST 03/21/2024 06:56:42 PM COMPARISON: 03/21/2024 CLINICAL HISTORY: Acute respiratory failure (HCC) FINDINGS: LINES, TUBES AND DEVICES: Right chest Port-A-Cath stable in place. LUNGS AND PLEURA: Stable retrocardiac opacification. Stable small left pleural  effusion. No pneumothorax. HEART AND MEDIASTINUM: Stable cardiomegaly. Aortic arch atherosclerosis. BONES AND SOFT TISSUES: No acute osseous abnormality. IMPRESSION: 1. Stable retrocardiac opacification and small left pleural effusion. 2. Stable cardiomegaly and aortic arch atherosclerosis. 3. Right chest Port-A-Cath stable in place. Electronically signed by: Dorethia Molt MD 03/21/2024 08:22 PM EST RP Workstation: [id number]   DG Chest Port 1 View Result Date: 03/21/2024 EXAM: 1 VIEW(S) XRAY OF THE CHEST 03/21/2024 09:05:00 AM COMPARISON: 03/18/2024 CLINICAL HISTORY: Acute hypoxic respiratory failure (HCC) [id number]. FINDINGS: LINES, TUBES AND DEVICES: Stable right IJ port catheter to the cavoatrial junction. LUNGS AND PLEURA: Worsening perihilar opacities. Worsening and left retrocardiac consolidation/atelectasis. Worsening linear perihilar opacities. Probable small left pleural effusion. No pneumothorax. HEART AND MEDIASTINUM: Aortic atherosclerosis (ICD-10: I70.0). BONES AND SOFT TISSUES: No acute osseous abnormality. IMPRESSION: 1. Worsening perihilar opacities and left retrocardiac consolidation/atelectasis. 2. Probable small left pleural effusion. Electronically signed by: Katheleen Faes MD 03/21/2024 09:59 AM EST RP Workstation: [id number]   DG HIP UNILAT WITH PELVIS 2-3 VIEWS LEFT Result Date: 03/20/2024 CLINICAL DATA:  Pain with recent fall. EXAM: DG HIP (WITH OR WITHOUT PELVIS) 2-3V LEFT COMPARISON:  12/17/2020, 12/21/2023. FINDINGS: Increased lucency is noted at the acetabulum on the left, concerning for metastatic disease and seen on prior PET-CT. There are cortical irregularities involving the medial aspect of the pelvic rim at the acetabulum and inferior pubic ramus, which may be due to metastatic disease. The possibility of superimposed in fracture cannot be excluded. 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 There degenerative changes in the lower lumbar spine. IMPRESSION: 1.  Increased lucency a at the acetabulum on the left, likely related to known metastatic disease. Cortical irregularities are also seen along the pelvic rim at the acetabulum on the left and inferior pubic ramus, raising the possibility for superimposed fracture. 2. Total hip arthroplasty changes bilaterally. Electronically Signed   By: Leita Birmingham M.D.   On: 03/20/2024 17:05     ECHO as above  TELEMETRY (personally reviewed): Atrial fibrillation rate 100-110s  EKG (personally reviewed): Atrial fibrillation rate 126 bpm  DATA reviewed by me 03/22/24: last 24h vitals tele labs imaging I/O, admission H&P, PCCM notes, palliative notes  Principal Problem:   Acute saddle pulmonary embolism (HCC) Active Problems:   Acute respiratory failure with hypoxia (HCC)   Acute pulmonary embolism without acute cor pulmonale (HCC)   Severe sepsis (HCC)   Mild pulmonic regurgitation and RV dysfunction by prior echocardiogram   AKI (acute kidney injury)   Palliative care encounter    ASSESSMENT AND PLAN: Hannah Yu is a 78 y.o. 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  Found to have acute segmental right PE, no evidence of right heart strain on CT.  Echo did reveal RV dysfunction.  Cardiology was consulted for further evaluation.   # Acute pulmonary emboli # Metastatic adenocarcinoma of lung # COPD # RV dysfunction # Atrial fibrillation RVR Patient presented with worsening shortness of breath, found to have pulmonary emboli without evidence of right heart strain on CTA.  Echo this admission with EF 60 to 65%, no WMA's, grade 1 diastolic dysfunction, moderate RV enlargement with moderately reduced  function, severely elevated PASP, severe right atrial dilation, severe TR. - Continue IV amiodarone.  - BP borderline this AM so will hold metoprolol  and give IV digoxin today. Reassess tomorrow. - Continue IV heparin  for anticoagulation. - Would not recommend pursuing right heart catheterization at this time.  Doubtful that this would provide any additional information that would overall change course of management. - Palliative medicine has been consulted.  Patient is critically ill with acute PE in setting of underlying metastatic lung cancer.  Prognosis is guarded.  This patient's case was discussed and created with Dr. Florencio and he is in agreement.  Signed:  Danita Bloch, PA-C  03/22/2024, 8:25 AM [facility] Card[facility]

## 2024-03-22 NOTE — Plan of Care (Signed)
  Problem: Education: Goal: Knowledge of General Education information will improve Description: Including pain rating scale, medication(s)/side effects and non-pharmacologic comfort measures Outcome: Progressing   Problem: Activity: Goal: Risk for activity intolerance will decrease Outcome: Progressing   Problem: Coping: Goal: Level of anxiety will decrease Outcome: Progressing   Problem: Elimination: Goal: Will not experience complications related to bowel motility Outcome: Progressing   Problem: Pain Managment: Goal: General experience of comfort will improve and/or be controlled Outcome: Progressing   Problem: Safety: Goal: Ability to remain free from injury will improve Outcome: Progressing

## 2024-03-22 NOTE — Consult Note (Signed)
 PHARMACY CONSULT NOTE - ELECTROLYTES  Pharmacy Consult for Electrolyte Monitoring and Replacement   Recent Labs: Height: 5' (152.4 cm) Weight: 68.3 kg (150 lb 9.2 oz) IBW/kg (Calculated) : 45.5 Estimated Creatinine Clearance: 45.2 mL/min (by C-G formula based on SCr of 0.87 mg/dL). Potassium (mmol/L)  Date Value  03/22/2024 4.3   Magnesium (mg/dL)  Date Value  88/81/7974 2.1   Calcium (mg/dL)  Date Value  88/81/7974 8.4 (L)   Albumin  (g/dL)  Date Value  88/82/7974 2.7 (L)   Phosphorus (mg/dL)  Date Value  88/81/7974 3.0   Sodium (mmol/L)  Date Value  03/22/2024 127 (L)   Corrected Ca: 9.3 mg/dL  Assessment  Hannah Yu is a 78 y.o. female presenting with shortness of breath. 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 Pharmacy has been consulted to monitor and replace electrolytes.  Goal of Therapy:  Potassium 4.0 - 5.1 mmol/L Magnesium 2.0 - 2.4 mg/dL All Other Electrolytes WNL  Plan:  No replacement currently indicated Check BMP, Mg, Phos with AM labs  Thank you for allowing pharmacy to be a part of this patient's care.  Adriana JONETTA Bolster, PharmD, BCPS Clinical Pharmacist 03/22/2024 7:15 AM

## 2024-03-23 ENCOUNTER — Inpatient Hospital Stay

## 2024-03-23 ENCOUNTER — Inpatient Hospital Stay: Admitting: Oncology

## 2024-03-23 DIAGNOSIS — A419 Sepsis, unspecified organism: Secondary | ICD-10-CM

## 2024-03-23 DIAGNOSIS — N179 Acute kidney failure, unspecified: Secondary | ICD-10-CM | POA: Diagnosis not present

## 2024-03-23 DIAGNOSIS — J9601 Acute respiratory failure with hypoxia: Secondary | ICD-10-CM | POA: Diagnosis not present

## 2024-03-23 DIAGNOSIS — I2699 Other pulmonary embolism without acute cor pulmonale: Secondary | ICD-10-CM | POA: Diagnosis not present

## 2024-03-23 DIAGNOSIS — I2602 Saddle embolus of pulmonary artery with acute cor pulmonale: Secondary | ICD-10-CM | POA: Diagnosis not present

## 2024-03-23 DIAGNOSIS — R6520 Severe sepsis without septic shock: Secondary | ICD-10-CM

## 2024-03-23 LAB — CBC
HCT: 25.9 % — ABNORMAL LOW (ref 36.0–46.0)
Hemoglobin: 8.1 g/dL — ABNORMAL LOW (ref 12.0–15.0)
MCH: 24.6 pg — ABNORMAL LOW (ref 26.0–34.0)
MCHC: 31.3 g/dL (ref 30.0–36.0)
MCV: 78.7 fL — ABNORMAL LOW (ref 80.0–100.0)
Platelets: 414 K/uL — ABNORMAL HIGH (ref 150–400)
RBC: 3.29 MIL/uL — ABNORMAL LOW (ref 3.87–5.11)
RDW: 23.2 % — ABNORMAL HIGH (ref 11.5–15.5)
WBC: 25.1 K/uL — ABNORMAL HIGH (ref 4.0–10.5)
nRBC: 0.2 % (ref 0.0–0.2)

## 2024-03-23 LAB — BASIC METABOLIC PANEL WITH GFR
Anion gap: 8 (ref 5–15)
BUN: 16 mg/dL (ref 8–23)
CO2: 25 mmol/L (ref 22–32)
Calcium: 8.2 mg/dL — ABNORMAL LOW (ref 8.9–10.3)
Chloride: 95 mmol/L — ABNORMAL LOW (ref 98–111)
Creatinine, Ser: 0.65 mg/dL (ref 0.44–1.00)
GFR, Estimated: >60 mL/min (ref >60)
Glucose, Bld: 122 mg/dL — ABNORMAL HIGH (ref 70–99)
Potassium: 4.3 mmol/L (ref 3.5–5.1)
Sodium: 127 mmol/L — ABNORMAL LOW (ref 135–145)

## 2024-03-23 LAB — CULTURE, BLOOD (ROUTINE X 2)
Culture: NO GROWTH
Culture: NO GROWTH

## 2024-03-23 LAB — PHOSPHORUS: Phosphorus: 2.3 mg/dL — ABNORMAL LOW (ref 2.5–4.6)

## 2024-03-23 LAB — MAGNESIUM: Magnesium: 2 mg/dL (ref 1.7–2.4)

## 2024-03-23 LAB — HEPARIN LEVEL (UNFRACTIONATED): Heparin Unfractionated: 0.43 [IU]/mL (ref 0.30–0.70)

## 2024-03-23 MED ORDER — METOPROLOL TARTRATE 25 MG PO TABS
12.5000 mg | ORAL_TABLET | Freq: Two times a day (BID) | ORAL | Status: DC
  Administered 2024-03-23 (×2): 12.5 mg via ORAL
  Filled 2024-03-23 (×2): qty 1

## 2024-03-23 MED ORDER — K PHOS MONO-SOD PHOS DI & MONO 155-852-130 MG PO TABS
500.0000 mg | ORAL_TABLET | Freq: Once | ORAL | Status: AC
  Administered 2024-03-23: 500 mg via ORAL
  Filled 2024-03-23: qty 2

## 2024-03-23 MED ORDER — THIAMINE HCL 100 MG PO TABS
100.0000 mg | ORAL_TABLET | Freq: Every day | ORAL | Status: AC
Start: 2024-03-23 — End: 2024-03-29
  Administered 2024-03-23 – 2024-03-29 (×7): 100 mg via ORAL
  Filled 2024-03-23 (×14): qty 1

## 2024-03-23 MED ORDER — ADULT MULTIVITAMIN W/MINERALS CH
1.0000 | ORAL_TABLET | Freq: Every day | ORAL | Status: DC
  Administered 2024-03-24 – 2024-03-26 (×3): 1 via ORAL
  Filled 2024-03-23 (×3): qty 1

## 2024-03-23 MED ORDER — FUROSEMIDE 10 MG/ML IJ SOLN
40.0000 mg | Freq: Once | INTRAMUSCULAR | Status: AC
  Administered 2024-03-23: 40 mg via INTRAVENOUS
  Filled 2024-03-23: qty 4

## 2024-03-23 MED ORDER — ENSURE PLUS HIGH PROTEIN PO LIQD
237.0000 mL | Freq: Three times a day (TID) | ORAL | Status: DC
  Administered 2024-03-23 – 2024-03-25 (×4): 237 mL via ORAL

## 2024-03-23 NOTE — Progress Notes (Signed)
 Pt transported. VSS. AxOx4. 10L HFNC. Updated family at bedside. All patient belongings kept at bedside transported with patient to new room.

## 2024-03-23 NOTE — Consult Note (Addendum)
 PHARMACY CONSULT NOTE - ELECTROLYTES  Pharmacy Consult for Electrolyte Monitoring and Replacement   Recent Labs: Height: 5' (152.4 cm) Weight: 72.2 kg (159 lb 2.8 oz) IBW/kg (Calculated) : 45.5 Estimated Creatinine Clearance: 50.6 mL/min (by C-G formula based on SCr of 0.65 mg/dL). Potassium (mmol/L)  Date Value  03/23/2024 4.3   Magnesium (mg/dL)  Date Value  88/80/7974 2.0   Calcium (mg/dL)  Date Value  88/80/7974 8.2 (L)   Albumin  (g/dL)  Date Value  88/82/7974 2.7 (L)   Phosphorus (mg/dL)  Date Value  88/80/7974 2.3 (L)   Sodium (mmol/L)  Date Value  03/23/2024 127 (L)   Corrected Ca: 9.2 mg/dL  Assessment  Hannah Yu is a 78 y.o. female presenting with shortness of breath. 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 Pharmacy has been consulted to monitor and replace electrolytes.  Diuretics: furosemide  40 mg IV once daily  Goal of Therapy:  Potassium 4.0 - 5.1 mmol/L Magnesium 2.0 - 2.4 mg/dL All Other Electrolytes WNL  Plan:  K phos Neutral 500 mg po x 1 (contains phosphorus 16 mMol, potassium 2.2 mEq) Check BMP, Mg, Phos with AM labs  Thank you for allowing pharmacy to be a part of this patient's care.  Adriana JONETTA Bolster, PharmD, BCPS Clinical Pharmacist 03/23/2024 7:25 AM

## 2024-03-23 NOTE — Evaluation (Signed)
 Physical Therapy Evaluation Patient Details Name: Hannah Yu MRN: [id number] DOB: Oct 27, 1945 Today's Date: 03/23/2024  History of Present Illness  78 y/o female presented to ED on 03/18/24 after fall and hypoxia. Admitted for acute hypoxic respiratory failure with acute segmental PE. PMH: stage IV lung adenocarcinoma with brain metastasis, HTN, diverticulitis, chronic acquired lymphedema, B12 deficiency, former smoker, chemo related peripheral neuropathy  Clinical Impression  Patient admitted with the above. PTA, patient lives with brother and was independent with mobility with no AD until 2 weeks ago when she began to use RW due to weakness. Patient with poor activity tolerance this date. Initiated mobility in bed and placed bed in chair position to increase tolerance to upright this date. On 40 L HHFNC, spO2 as low as 92% and HR up to 122 during session with minimal activity. Able to move legs in bed (heel slides, SAQ) but with L LE weaker than R. Encouraged patient and friend at bedside to assist with care with nursing staff as well as exercises in bed to improve strength, both verbalized understanding. Slight drop in BP with chair position but MAP WNL. Notified RN. Patient will benefit from skilled PT services during acute stay to address listed deficits. Patient will benefit from ongoing therapy at discharge to maximize functional independence and safety.         If plan is discharge home, recommend the following: Two people to help with walking and/or transfers;A lot of help with bathing/dressing/bathroom;Assistance with cooking/housework;Assist for transportation;Help with stairs or ramp for entrance   Can travel by private vehicle   No    Equipment Recommendations Other (comment) (TBD)  Recommendations for Other Services       Functional Status Assessment Patient has had a recent decline in their functional status and demonstrates the ability to make significant improvements in  function in a reasonable and predictable amount of time.     Precautions / Restrictions Precautions Precautions: Fall Recall of Precautions/Restrictions: Intact Restrictions Weight Bearing Restrictions Per Provider Order: No      Mobility  Bed Mobility               General bed mobility comments: placed in chair position and assessed vitals due to poor activity tolerance    Transfers                   General transfer comment: deferred due to poor activity tolerance    Ambulation/Gait                  Stairs            Wheelchair Mobility     Tilt Bed    Modified Rankin (Stroke Patients Only)       Balance                                             Pertinent Vitals/Pain Pain Assessment Pain Assessment: Faces Faces Pain Scale: Hurts even more Pain Location: L thigh Pain Descriptors / Indicators: Discomfort, Grimacing Pain Intervention(s): Limited activity within patient's tolerance, Monitored during session, Repositioned    Home Living Family/patient expects to be discharged to:: Private residence Living Arrangements: Other relatives;Non-relatives/Friends Available Help at Discharge: Family Type of Home: House Home Access: Stairs to enter Entrance Stairs-Rails: Doctor, General Practice of Steps: 7   Home Layout: Two level;Able to live on main level  with bedroom/bathroom Home Equipment: Rolling Siddhant Hashemi (2 wheels);Cane - single point      Prior Function Prior Level of Function : Independent/Modified Independent             Mobility Comments: typically independent, however in past 2 weeks, patient has been using RW for mobility due to weakness       Extremity/Trunk Assessment   Upper Extremity Assessment Upper Extremity Assessment: Defer to OT evaluation    Lower Extremity Assessment Lower Extremity Assessment: Generalized weakness (L>R)       Communication    Communication Communication: No apparent difficulties    Cognition Arousal: Alert Behavior During Therapy: WFL for tasks assessed/performed   PT - Cognitive impairments: No apparent impairments                         Following commands: Intact       Cueing Cueing Techniques: Verbal cues     General Comments General comments (skin integrity, edema, etc.): On 40L HHFNC, spO2 down to 92% with activity and HR up to 122    Exercises Other Exercises Other Exercises: Heel slides x 4 on R, x 3 on L - AAROM Other Exercises: SAQ in chair position x 3 on each LE Other Exercises: Pulling trunk forward with use of bed rails x 1 with ability to hold x 30 seconds prior to needing rest break   Assessment/Plan    PT Assessment Patient needs continued PT services  PT Problem List Decreased strength;Decreased activity tolerance;Decreased balance;Decreased mobility;Decreased knowledge of use of DME;Decreased safety awareness;Decreased knowledge of precautions;Cardiopulmonary status limiting activity;Pain       PT Treatment Interventions Gait training;DME instruction;Functional mobility training;Therapeutic activities;Stair training;Therapeutic exercise;Balance training;Neuromuscular re-education;Patient/family education    PT Goals (Current goals can be found in the Care Plan section)  Acute Rehab PT Goals Patient Stated Goal: to get better to go home PT Goal Formulation: With patient/family Time For Goal Achievement: 04/06/24 Potential to Achieve Goals: Fair    Frequency Min 3X/week     Co-evaluation PT/OT/SLP Co-Evaluation/Treatment: Yes Reason for Co-Treatment: For patient/therapist safety;To address functional/ADL transfers;Complexity of the patient's impairments (multi-system involvement) PT goals addressed during session: Mobility/safety with mobility;Strengthening/ROM         AM-PAC PT 6 Clicks Mobility  Outcome Measure Help needed turning from your back to your  side while in a flat bed without using bedrails?: Total Help needed moving from lying on your back to sitting on the side of a flat bed without using bedrails?: Total Help needed moving to and from a bed to a chair (including a wheelchair)?: Total Help needed standing up from a chair using your arms (e.g., wheelchair or bedside chair)?: Total Help needed to walk in hospital room?: Total Help needed climbing 3-5 steps with a railing? : Total 6 Click Score: 6    End of Session Equipment Utilized During Treatment: Oxygen Activity Tolerance: Patient limited by fatigue Patient left: in bed;with call bell/phone within reach;with family/visitor present Nurse Communication: Mobility status PT Visit Diagnosis: Muscle weakness (generalized) (M62.81);Other abnormalities of gait and mobility (R26.89);Difficulty in walking, not elsewhere classified (R26.2)    Time: 8899-8872 PT Time Calculation (min) (ACUTE ONLY): 27 min   Charges:   PT Evaluation $PT Eval Moderate Complexity: 1 Mod PT Treatments $Therapeutic Activity: 8-22 mins PT General Charges $$ ACUTE PT VISIT: 1 Visit         Maryanne Finder, PT, DPT Physical Therapist - [place]  [place]  [facility]   [place] Riesen A Azzure Garabedian 03/23/2024, 12:56 PM

## 2024-03-23 NOTE — Progress Notes (Addendum)
 [facility] CLINIC CARDIOLOGY PROGRESS NOTE   Patient ID: Hannah Yu MRN: [id number] DOB/AGE: 10-26-1945 78 y.o.  Admit date: 03/18/2024 Referring Physician Dr. Isadora Primary Physician Epifanio Alm SQUIBB, MD  Primary Cardiologist None Reason for Consultation PE, RV dysfunction  HPI: Hannah Yu is a 78 y.o. 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  Found to have acute segmental right PE, no evidence of right heart strain on CT.  Echo did reveal RV dysfunction.  Cardiology was consulted for further evaluation.   Interval History:  - Patient seen and examined this morning, resting in hospital bed with family at bedside. - She states that she feels overall well today. Feels that SOB is better today. - She denies any chest pain or palpitation symptoms.  HR again improved from yesterday.  Review of systems complete and found to be negative unless listed above   Vitals:   03/23/24 0600 03/23/24 0700 03/23/24 0740 03/23/24 0800  BP: (!) 145/70 134/72  130/82  Pulse: 76 78  74  Resp: 14 15  14   Temp:   98.8 F (37.1 C)   TempSrc:   Axillary   SpO2: 100% 100%  98%  Weight:      Height:         Intake/Output Summary (Last 24 hours) at 03/23/2024 0902 Last data filed at 03/23/2024 0700 Gross per 24 hour  Intake 1431.12 ml  Output 447 ml  Net 984.12 ml     PHYSICAL EXAM General: Chronically ill-appearing elderly female, well nourished, in no acute distress. HEENT: Normocephalic and atraumatic. Neck: No JVD.  Lungs: Normal respiratory effort on HFNC. Clear bilaterally to auscultation. No wheezes, crackles, rhonchi.  Heart: Irregularly irregular, borderline controlled rate.  Normal S1 and S2 without gallops or murmurs. Radial & DP pulses 2+ bilaterally. Abdomen: Non-distended appearing.  Msk: Normal strength and tone for age. Extremities: No clubbing, cyanosis or edema.   Neuro: Alert and oriented X 3. Psych: Mood appropriate, affect congruent.    LABS: Basic Metabolic Panel: Recent Labs    03/22/24 0442 03/23/24 0425  NA 127* 127*  K 4.3 4.3  CL 92* 95*  CO2 25 25  GLUCOSE 124* 122*  BUN 19 16  CREATININE 0.87 0.65  CALCIUM 8.4* 8.2*  MG 2.1 2.0  PHOS 3.0 2.3*   Liver Function Tests: Recent Labs    03/21/24 0451  AST 26  ALT 27  ALKPHOS 240*  BILITOT 0.2  PROT 5.8*  ALBUMIN  2.7*   No results for input(s): LIPASE, AMYLASE in the last 72 hours. CBC: Recent Labs    03/22/24 0442 03/23/24 0425  WBC 16.7* 25.1*  HGB 8.5* 8.1*  HCT 27.4* 25.9*  MCV 79.2* 78.7*  PLT 414* 414*   Cardiac Enzymes: No results for input(s): CKTOTAL, CKMB, CKMBINDEX, TROPONINIHS in the last 72 hours. BNP: No results for input(s): BNP in the last 72 hours. D-Dimer: No results for input(s): DDIMER in the last 72 hours. Hemoglobin A1C: No results for input(s): HGBA1C in the last 72 hours. Fasting Lipid Panel: No results for input(s): CHOL, HDL, LDLCALC, TRIG, CHOLHDL, LDLDIRECT in the last 72 hours. Thyroid  Function Tests: No results for input(s): TSH, T4TOTAL, T3FREE, THYROIDAB in the last 72 hours.  Invalid  input(s): FREET3 Anemia Panel: No results for input(s): VITAMINB12, FOLATE, FERRITIN, TIBC, IRON, RETICCTPCT in the last 72 hours.  DG Chest Port 1 View Result Date: 03/22/2024 EXAM: 1 VIEW(S) XRAY OF THE CHEST 03/22/2024 06:02:00 PM COMPARISON: 03/22/2024 CLINICAL HISTORY: Acute hypoxemic respiratory failure (HCC) [id number] FINDINGS: LINES, TUBES AND DEVICES: Stable right chest Port-A-Cath. LUNGS AND PLEURA: Improved perihilar interstitial pulmonary infiltrate. Persistent retrocardiac  consolidation in keeping with atelectasis or infiltrate within this region. Small left pleural effusion. No pleural effusion on the right. No pneumothorax. HEART AND MEDIASTINUM: Stable cardiomegaly. BONES AND SOFT TISSUES: No acute osseous abnormality. IMPRESSION: 1. Improved perihilar interstitial pulmonary infiltrate and small left pleural effusion, suggesting changes of mild cardiogenic failure. 2. Persistent retrocardiac consolidation, in keeping with atelectasis or infiltrate within this region. Electronically signed by: Dorethia Molt MD 03/22/2024 11:29 PM EST RP Workstation: [id number]   DG Chest Port 1 View Result Date: 03/22/2024 EXAM: 1 VIEW(S) XRAY OF THE CHEST 03/22/2024 05:06:00 AM COMPARISON: 03/21/2024. CLINICAL HISTORY: Acute hypoxic respiratory failure (HCC) [id number] FINDINGS: LINES, TUBES AND DEVICES: Right port a cath tip at superior cavo / atrial junction. LUNGS AND PLEURA: Moderate interstitial edema, new or increased. Similar left base airspace disease. Suspect a similar tiny left pleural effusion. No pneumothorax. HEART AND MEDIASTINUM: Moderate cardiomegaly. BONES AND SOFT TISSUES: No acute osseous abnormality. IMPRESSION: 1. Cardiomegaly with new or increased moderate congestive heart failure. 2. Similar small left pleural effusion and adjacent airspace disease, which could represent atelectasis or infection. Electronically signed by: Rockey Kilts MD 03/22/2024 10:41 AM EST RP Workstation: [id number]   DG Chest Port 1 View Result Date: 03/21/2024 EXAM: 1 VIEW(S) XRAY OF THE CHEST 03/21/2024 06:56:42 PM COMPARISON: 03/21/2024 CLINICAL HISTORY: Acute respiratory failure (HCC) FINDINGS: LINES, TUBES AND DEVICES: Right chest Port-A-Cath stable in place. LUNGS AND PLEURA: Stable retrocardiac opacification. Stable small left pleural effusion. No pneumothorax. HEART AND MEDIASTINUM: Stable cardiomegaly. Aortic arch atherosclerosis. BONES AND SOFT TISSUES: No acute osseous abnormality.  IMPRESSION: 1. Stable retrocardiac opacification and small left pleural effusion. 2. Stable cardiomegaly and aortic arch atherosclerosis. 3. Right chest Port-A-Cath stable in place. Electronically signed by: Dorethia Molt MD 03/21/2024 08:22 PM EST RP Workstation: [id number]   DG Chest Port 1 View Result Date: 03/21/2024 EXAM: 1 VIEW(S) XRAY OF THE CHEST 03/21/2024 09:05:00 AM COMPARISON: 03/18/2024 CLINICAL HISTORY: Acute hypoxic respiratory failure (HCC) [id number]. FINDINGS: LINES, TUBES AND DEVICES: Stable right IJ port catheter to the cavoatrial junction. LUNGS AND PLEURA: Worsening perihilar opacities. Worsening and left retrocardiac consolidation/atelectasis. Worsening linear perihilar opacities. Probable small left pleural effusion. No pneumothorax. HEART AND MEDIASTINUM: Aortic atherosclerosis (ICD-10: I70.0). BONES AND SOFT TISSUES: No acute osseous abnormality. IMPRESSION: 1. Worsening perihilar opacities and left retrocardiac consolidation/atelectasis. 2. Probable small left pleural effusion. Electronically signed by: Katheleen Faes MD 03/21/2024 09:59 AM EST RP Workstation: [id number]     ECHO as above  TELEMETRY (personally reviewed): SR PACs rate 80s  EKG (personally reviewed): Atrial fibrillation rate 126 bpm  DATA reviewed by me 03/23/24: last 24h vitals tele labs imaging I/O, admission H&P, PCCM notes, palliative notes  Principal Problem:   Acute saddle pulmonary embolism (HCC) Active Problems:   Acute respiratory failure with hypoxia (HCC)   Acute pulmonary embolism without acute cor pulmonale (HCC)   Severe sepsis (HCC)   Mild pulmonic regurgitation and RV dysfunction by prior echocardiogram   AKI (acute kidney injury)   Palliative care encounter    ASSESSMENT AND PLAN: Hannah Yu is a 78 y.o. 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  Found to have acute segmental right PE, no evidence of right heart strain on CT.  Echo did reveal RV dysfunction.  Cardiology was consulted for further evaluation.   # Acute pulmonary emboli # Metastatic adenocarcinoma of lung # COPD # RV dysfunction # Atrial fibrillation RVR Patient presented with worsening shortness of breath, found to have pulmonary emboli without evidence of right heart strain on CTA.  Echo this admission with EF 60 to 65%, no WMA's, grade 1 diastolic dysfunction, moderate RV enlargement with moderately reduced function, severely elevated PASP, severe right atrial dilation, severe TR. - Will continue IV lasix  40 mg daily today and monitor UOP. - Continue IV amiodarone.  - BP better today, will give metoprolol  tartrate 12.5 mg twice daily. - Continue IV heparin  for anticoagulation. - Would not recommend pursuing right heart catheterization at this time.  Doubtful that this would provide any additional information that would overall change course of management. - Palliative medicine has been consulted.  Patient is critically ill with acute PE in setting of underlying metastatic lung cancer now with worsening respiratory status.  Prognosis is guarded.  This patient's case was discussed and created with Dr. Florencio and he is in agreement.  Signed:  Danita Bloch, PA-C  03/23/2024, 9:02 AM [facility] Cardiology

## 2024-03-23 NOTE — Evaluation (Signed)
 Occupational Therapy Evaluation Patient Details Name: Hannah Yu MRN: [id number] DOB: [birth year] Today's Date: 03/23/2024   History of Present Illness   78 y/o female presented to ED on 03/18/24 after fall and hypoxia. Admitted for acute hypoxic respiratory failure with acute segmental PE. PMH: stage IV lung adenocarcinoma with brain metastasis, HTN, diverticulitis, chronic acquired lymphedema, B12 deficiency, former smoker, chemo related peripheral neuropathy     Clinical Impressions Chart reviewed to date, pt greeted semi supine in bed, agreeable to OT evaluation. Co tx completed with PT on this date. PTA pt was generally MOD I-I in ADL/IADL amb with no AD until approx 2-3 weeks ago. Pt presents with deficits in activity tolerance, endurance, balance, affecting safe and optimal ADL completion. HR to 120s bpm with repositioning of legs, she tolerates unsupported sit from semi supine in bed with MAX A+2 initially.MIN A for oral care. Pt is placed in chair position and spo2 >95% on 40L via HHFNC. Educated pt/friend re: importance of mobility attempts, pt is performing ADL below PLOF, will benefit from acute OT to address functional deficits and to facilttate optimal ADL/functional mobility performance.      If plan is discharge home, recommend the following:   Two people to help with walking and/or transfers;Two people to help with bathing/dressing/bathroom     Functional Status Assessment   Patient has had a recent decline in their functional status and demonstrates the ability to make significant improvements in function in a reasonable and predictable amount of time.     Equipment Recommendations   BSC/3in1;Hoyer lift;Wheelchair (measurements OT);Wheelchair cushion (measurements OT);Hospital bed     Recommendations for Other Services         Precautions/Restrictions   Precautions Precautions: Fall Recall of Precautions/Restrictions: Intact Restrictions Weight  Bearing Restrictions Per Provider Order: No     Mobility Bed Mobility               General bed mobility comments: semi supine to unsupported sit with MAX A +2, placed in chair position due to poor activity tolerance    Transfers                          Balance                                           ADL either performed or assessed with clinical judgement   ADL Overall ADL's : Needs assistance/impaired     Grooming: Minimal assistance;Bed level, oral care               Lower Body Dressing: Maximal assistance;Bed level                       Vision Patient Visual Report: No change from baseline       Perception         Praxis         Pertinent Vitals/Pain Pain Assessment Pain Assessment: Faces Faces Pain Scale: Hurts even more Pain Location: L thigh Pain Descriptors / Indicators: Discomfort, Grimacing Pain Intervention(s): Monitored during session, Limited activity within patient's tolerance, Repositioned     Extremity/Trunk Assessment Upper Extremity Assessment Upper Extremity Assessment: Generalized weakness   Lower Extremity Assessment Lower Extremity Assessment: Generalized weakness       Communication Communication Communication: No apparent difficulties Factors Affecting Communication:  (dysphonic)  Cognition Arousal: Alert Behavior During Therapy: WFL for tasks assessed/performed Cognition: Cognition impaired         Attention impairment (select first level of impairment): Sustained attention Executive functioning impairment (select all impairments): Reasoning, Problem solving                   Following commands: Intact       Cueing  General Comments   Cueing Techniques: Verbal cues  on 40L via HHFNC fio2 49%, BP 99/50 HR 112 in chair position   Exercises Other Exercises Other Exercises: edu pt/family re role of OT, role of rehab, discharge recommendations   Shoulder  Instructions      Home Living Family/patient expects to be discharged to:: Private residence Living Arrangements: Other relatives;Non-relatives/Friends Available Help at Discharge: Family Type of Home: House Home Access: Stairs to enter [employer] of Steps: 7 Entrance Stairs-Rails: Right;Left Home Layout: Two level;Able to live on main level with bedroom/bathroom     Bathroom Shower/Tub: Walk-in shower (on second floor)   Bathroom Toilet: Handicapped height     Home Equipment: Agricultural Consultant (2 wheels);Cane - single point          Prior Functioning/Environment Prior Level of Function : Independent/Modified Independent             Mobility Comments: usually amb with no AD, required RW the last two weeks ADLs Comments: was recently MOD I-I in ADL/IADL, now requiring assist due to decreased activty tolerance in the last 2-3 weeks    OT Problem List: Decreased strength;Impaired balance (sitting and/or standing);Decreased activity tolerance;Decreased knowledge of use of DME or AE   OT Treatment/Interventions: Self-care/ADL training;DME and/or AE instruction;Therapeutic activities;Balance training;Therapeutic exercise;Patient/family education      OT Goals(Current goals can be found in the care plan section)   Acute Rehab OT Goals Patient Stated Goal: go home OT Goal Formulation: With patient/family Time For Goal Achievement: 04/05/24 Potential to Achieve Goals: Fair ADL Goals Pt Will Perform Grooming: with modified independence;sitting;standing Pt Will Perform Lower Body Dressing: with modified independence;sit to/from stand;sitting/lateral leans Pt Will Transfer to Toilet: with modified independence;ambulating Pt Will Perform Toileting - Clothing Manipulation and hygiene: with modified independence;sitting/lateral leans;sit to/from stand   OT Frequency:  Min 2X/week    Co-evaluation PT/OT/SLP Co-Evaluation/Treatment: Yes Reason for Co-Treatment: For  patient/therapist safety;To address functional/ADL transfers;Complexity of the patient's impairments (multi-system involvement) PT goals addressed during session: Mobility/safety with mobility;Strengthening/ROM OT goals addressed during session: ADL's and self-care      AM-PAC OT 6 Clicks Daily Activity     Outcome Measure Help from another person eating meals?: A Little Help from another person taking care of personal grooming?: A Little Help from another person toileting, which includes using toliet, bedpan, or urinal?: A Lot Help from another person bathing (including washing, rinsing, drying)?: A Lot Help from another person to put on and taking off regular upper body clothing?: A Little Help from another person to put on and taking off regular lower body clothing?: A Lot 6 Click Score: 15   End of Session Equipment Utilized During Treatment: Rolling walker (2 wheels) Nurse Communication: Mobility status  Activity Tolerance: Treatment limited secondary to medical complications (Comment) Patient left: in bed;with call bell/phone within reach;with bed alarm set;with family/visitor present (in chair position)  OT Visit Diagnosis: Other abnormalities of gait and mobility (R26.89);Muscle weakness (generalized) (M62.81)                Time: 8899-8872 OT Time Calculation (min):  27 min Charges:  OT General Charges $OT Visit: 1 Visit OT Evaluation $OT Eval High Complexity: 1 High  Therisa Sheffield, OTD OTR/L  03/23/24, 3:54 PM

## 2024-03-23 NOTE — Progress Notes (Signed)
 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  TTE with RV strain currently on heparin  drip.  Course complicated by A-fib with RVR requiring amiodarone drip. Initially required levophed gtt which has been wean off. Cardiology following. Hospital course as below  11/19: transfer to PCU, wound care c/s   Subjective:  Feeling better, less SOB, sitting in the bed. Daughter at bedside  Objective: Vitals:   03/23/24 1500 03/23/24 1554 03/23/24 1638 03/23/24 1933  BP: 124/63 136/69 125/64 (!) 141/75  Pulse: 69 80 75 76  Resp: 18 17 19 20   Temp:  98.4 F (36.9 C) 97.6 F (36.4 C) 97.9 F (36.6 C)  TempSrc:  Oral    SpO2: 96% 100% 100% 100%  Weight:      Height:        Intake/Output Summary (Last 24 hours) at 03/23/2024 2038 Last data filed at 03/23/2024 1445 Gross per 24 hour  Intake 802.2 ml  Output 1340 ml  Net -537.8 ml   Filed Weights   03/21/24 0500 03/22/24 0439 03/23/24 0400  Weight: 78.2 kg 68.3 kg 72.2 kg    Examination: General: Acute on chronically-ill appearing female, on HHFNC HENT: Supple, no JVD Lungs: Faint rhonchi throughout, even, non labored  Cardiovascular: Irregular irregular, no m/r/g, 2+ radial/1+ distal pulses, 1+ bilateral lower extremity edema  Abdomen: +BS x4, non distended, non tender  Extremities: Normal bulk and tone, moves all extremities  Neuro: Alert and oriented, follows  commands GU: Indwelling foley catheter draining yellow urine   Assessment & Plan:  Acute segmental PE - US  shows nonocclusive thrombus in right posterior tibial and peroneal vein - IR consulted for acute segmental PE clot burden minimal and thrombolysis contraindicated due to brain metastasis   - Continue heparin  gtt, change to Eliquis tomorrow  Possible pneumonia - RVP negative, MRSA nares negative - Blood culture 11/14 NGTD - Continue IV ceftriaxone and transition to PO before DC - worsening leukocytosis  Acute hypoxic respiratory failure  - Suspect likely due to acute segmental PE, possible pneumonia, A fib, volume overload - Was changed from BiPAP to HFNC -> weaned off to 7 liters [place] - Wean oxygen as able  Atrial fibrillation with RVR Cardiogenic shock, pulmonary HTN - Echo 03/19/24: EF 60 to 65%, grade I diastolic dysfunction, right ventricle size moderately enlarged, severely elevated pulmonary artery systolic pressures, severe tricuspid valve regurgitation - Continue IV amiodarone, s/p IV albumin , IV Lasix  - restart oral metoprolol  - transfer to PCU   Metastatic adenocarcinoma  - Seen by oncology, appreciate recs - New severe T6 compression fracture - XR hip increased lucency at the acetabulum on the left, likely related to metastatic disease.  Possibility of superimposed fracture - Oncology and Palliative care following  Hyponatremia  - Sodium improved from 117 on presentation, slowly up to 127. Unclear etiology - send serum osm, urine osm, urine Na - Monitor BMP  AKI - resolved - Monitor Cr  - PT/OT recommends  SNF. Patient declines and would like to go home  DVT prophylaxis: IV Heparin    Code Status: Limited: Do not attempt resuscitation (DNR) -DNR-LIMITED -Do Not Intubate/DNI  Disposition:  possible DC in next 2-3 days depending on clinical condition  Consultants:  PCCM Cardiology  Antimicrobials:  Anti-infectives (From admission, onward)    Start      Dose/Rate Route Frequency Ordered Stop   03/22/24 1815  azithromycin  (ZITHROMAX ) 500 mg in sodium chloride  0.9 % 250 mL IVPB        500 mg 250 mL/hr over 60 Minutes Intravenous Every 24 hours 03/22/24 1256 03/22/24 1833   03/22/24 1600  cefTRIAXone  (ROCEPHIN ) 2 g in sodium chloride  0.9 % 100 mL IVPB        2 g 200 mL/hr over 30 Minutes Intravenous Every 24 hours 03/22/24 1449 03/25/24 1559   03/20/24 0600  vancomycin  (VANCOREADY) IVPB 750 mg/150 mL  Status:  Discontinued       Placed in Followed by Linked Group   750 mg 150 mL/hr over 60 Minutes Intravenous Every 24 hours 03/19/24 0530 03/19/24 1049   03/19/24 1815  azithromycin  (ZITHROMAX ) 500 mg in sodium chloride  0.9 % 250 mL IVPB  Status:  Discontinued        500 mg 250 mL/hr over 60 Minutes Intravenous Every 24 hours 03/19/24 1716 03/22/24 1256   03/19/24 1400  cefTRIAXone  (ROCEPHIN ) 2 g in sodium chloride  0.9 % 100 mL IVPB        2 g 200 mL/hr over 30 Minutes Intravenous Every 24 hours 03/19/24 1049 03/22/24 2359   03/19/24 0800  ceFEPIme  (MAXIPIME ) 2 g in sodium chloride  0.9 % 100 mL IVPB  Status:  Discontinued        2 g 200 mL/hr over 30 Minutes Intravenous Every 12 hours 03/19/24 0523 03/19/24 1049   03/19/24 0615  vancomycin  (VANCOREADY) IVPB 1750 mg/350 mL       Placed in Followed by Linked Group   1,750 mg 175 mL/hr over 120 Minutes Intravenous  Once 03/19/24 0530 03/19/24 0812   03/18/24 1945  cefTRIAXone  (ROCEPHIN ) 2 g in sodium chloride  0.9 % 100 mL IVPB        2 g 200 mL/hr over 30 Minutes Intravenous  Once 03/18/24 1930 03/18/24 2202   03/18/24 1945  azithromycin  (ZITHROMAX ) 500 mg in sodium chloride  0.9 % 250 mL IVPB        500 mg 250 mL/hr over 60 Minutes Intravenous  Once 03/18/24 1930 03/19/24 0001       Data Reviewed: I have personally reviewed following labs and imaging studies CBC: Recent Labs  Lab 03/18/24 1850 03/19/24 0326 03/20/24 0624 03/21/24 0451 03/22/24 0442 03/23/24 0425  WBC 27.3*  31.2* 20.6* 15.1* 16.7* 25.1*  NEUTROABS 24.0*  --   --   --   --   --   HGB 9.9* 8.8* 9.1* 8.0* 8.5* 8.1*  HCT 30.7* 27.1* 28.3* 26.2* 27.4* 25.9*  MCV 76.2* 75.7* 77.7* 79.6* 79.2* 78.7*  PLT 465* 410* 457* 446* 414* 414*   Basic Metabolic Panel: Recent Labs  Lab 03/18/24 1850 03/19/24 0326 03/20/24 0624 03/21/24 0451 03/22/24 0442 03/23/24 0425  NA 117* 120* 129* 129* 127* 127*  K 5.0 4.6 4.2 4.4 4.3 4.3  CL 80* 87* 92* 95* 92* 95*  CO2 21* 20* 22 25 25 25   GLUCOSE 195* 165* 103* 112* 124* 122*  BUN 35* 37* 25* 17 19 16   CREATININE 1.05* 1.14* 0.71 0.63 0.87 0.65  CALCIUM 8.6* 7.7* 8.5* 8.5* 8.4* 8.2*  MG 2.1 2.0 2.1  --  2.1 2.0  PHOS  --  4.5 3.6  --  3.0 2.3*   GFR: Estimated Creatinine Clearance: 50.6 mL/min (by C-G formula based on SCr of 0.65 mg/dL). Liver Function Tests: Recent Labs  Lab 03/18/24 1850 03/21/24 0451  AST 54* 26  ALT 30 27  ALKPHOS 228* 240*  BILITOT 0.3 0.2  PROT 6.7 5.8*  ALBUMIN  3.0* 2.7*   CBG: Recent Labs  Lab 03/19/24 0015  GLUCAP 173*    Recent Results (from the past 240 hours)  Blood Culture (routine x 2)     Status: None   Collection Time: 03/18/24  6:00 PM   Specimen: BLOOD  Result Value Ref Range Status   Specimen Description BLOOD RIGHT ANTECUBITAL  Final   Special Requests   Final    BOTTLES DRAWN AEROBIC AND ANAEROBIC Blood Culture results may not be optimal due to an inadequate volume of blood received in culture bottles   Culture   Final    NO GROWTH 5 DAYS Performed at [facility], [address] [place], [place] [address]    Report Status 03/23/2024 FINAL  Final  Blood Culture (routine x 2)     Status: None   Collection Time: 03/18/24  6:05 PM   Specimen: BLOOD  Result Value Ref Range Status   Specimen Description BLOOD BLOOD LEFT HAND  Final   Special Requests   Final    BOTTLES DRAWN AEROBIC AND ANAEROBIC Blood Culture results may not be optimal due to an inadequate volume of blood received in  culture bottles   Culture   Final    NO GROWTH 5 DAYS Performed at [facility], [address] [place], [place] [address]    Report Status 03/23/2024 FINAL  Final  MRSA Next Gen by PCR, Nasal     Status: None   Collection Time: 03/19/24  5:04 AM   Specimen: Anterior Nasal Swab  Result Value Ref Range Status   MRSA by PCR Next Gen NOT DETECTED NOT DETECTED Final    Comment: (NOTE) The GeneXpert MRSA Assay (FDA approved for NASAL specimens only), is one component of a comprehensive MRSA colonization surveillance program. It is not intended to diagnose MRSA infection nor to guide or monitor treatment for MRSA infections. Test performance is not FDA approved in patients less than 55 years old. Performed at [facility], [address] [place], [place] [address]   Resp panel by RT-PCR (RSV, Flu A&B, Covid) Anterior Nasal Swab     Status: None   Collection Time: 03/19/24  5:04 AM   Specimen: Anterior Nasal Swab  Result Value Ref Range Status   SARS Coronavirus 2 by RT PCR NEGATIVE NEGATIVE Final    Comment: (NOTE) SARS-CoV-2 target nucleic acids are NOT DETECTED.  The SARS-CoV-2 RNA is generally detectable in upper respiratory specimens during the acute phase of infection. The lowest concentration of SARS-CoV-2 viral copies this assay can detect is 138 copies/mL. A negative result does not preclude SARS-Cov-2 infection and should not be used as the sole basis for treatment or other patient management decisions. A negative result may occur with  improper specimen collection/handling, submission of specimen other than nasopharyngeal swab, presence of viral mutation(s) within the areas targeted by this assay, and inadequate number of viral copies(<138 copies/mL). A negative result must be combined with clinical observations, patient history, and epidemiological information. The expected result is Negative.  Fact Sheet for Patients:   bloggercourse.com  Fact Sheet for Healthcare Providers:  seriousbroker.it  This test is no t yet approved or cleared by the [place]  FDA and  has been authorized for detection and/or diagnosis of SARS-CoV-2 by FDA under an Emergency Use Authorization (EUA). This EUA will remain  in effect (meaning this test can be used) for the duration of the COVID-19 declaration under Section 564(b)(1) of the Act, 21 U.S.C.section 360bbb-3(b)(1), unless the authorization is terminated  or revoked sooner.       Influenza A by PCR NEGATIVE NEGATIVE Final   Influenza B by PCR NEGATIVE NEGATIVE Final    Comment: (NOTE) The Xpert Xpress SARS-CoV-2/FLU/RSV plus assay is intended as an aid in the diagnosis of influenza from Nasopharyngeal swab specimens and should not be used as a sole basis for treatment. Nasal washings and aspirates are unacceptable for Xpert Xpress SARS-CoV-2/FLU/RSV testing.  Fact Sheet for Patients: bloggercourse.com  Fact Sheet for Healthcare Providers: seriousbroker.it  This test is not yet approved or cleared by the [place]  FDA and has been authorized for detection and/or diagnosis of SARS-CoV-2 by FDA under an Emergency Use Authorization (EUA). This EUA will remain in effect (meaning this test can be used) for the duration of the COVID-19 declaration under Section 564(b)(1) of the Act, 21 U.S.C. section 360bbb-3(b)(1), unless the authorization is terminated or revoked.     Resp Syncytial Virus by PCR NEGATIVE NEGATIVE Final    Comment: (NOTE) Fact Sheet for Patients: bloggercourse.com  Fact Sheet for Healthcare Providers: seriousbroker.it  This test is not yet approved or cleared by the [place]  FDA and has been authorized for detection and/or diagnosis of SARS-CoV-2 by FDA under an Emergency Use  Authorization (EUA). This EUA will remain in effect (meaning this test can be used) for the duration of the COVID-19 declaration under Section 564(b)(1) of the Act, 21 U.S.C. section 360bbb-3(b)(1), unless the authorization is terminated or revoked.  Performed at [facility], [address] [place], [place] [address]   Respiratory (~20 pathogens) panel by PCR     Status: None   Collection Time: 03/21/24  9:37 AM   Specimen: Nasopharyngeal Swab; Respiratory  Result Value Ref Range Status   Adenovirus NOT DETECTED NOT DETECTED Final   Coronavirus 229E NOT DETECTED NOT DETECTED Final    Comment: (NOTE) The Coronavirus on the Respiratory Panel, DOES NOT test for the novel  Coronavirus (2019 nCoV)    Coronavirus HKU1 NOT DETECTED NOT DETECTED Final   Coronavirus NL63 NOT DETECTED NOT DETECTED Final   Coronavirus OC43 NOT DETECTED NOT DETECTED Final   Metapneumovirus NOT DETECTED NOT DETECTED Final   Rhinovirus / Enterovirus NOT DETECTED NOT DETECTED Final   Influenza A NOT DETECTED NOT DETECTED Final   Influenza B NOT DETECTED NOT DETECTED Final   Parainfluenza Virus 1 NOT DETECTED NOT DETECTED Final   Parainfluenza Virus 2 NOT DETECTED NOT DETECTED Final   Parainfluenza Virus 3 NOT DETECTED NOT DETECTED Final   Parainfluenza Virus 4 NOT DETECTED NOT DETECTED Final   Respiratory Syncytial Virus NOT DETECTED NOT DETECTED Final   Bordetella pertussis NOT DETECTED NOT DETECTED Final   Bordetella Parapertussis NOT DETECTED NOT DETECTED Final   Chlamydophila pneumoniae NOT DETECTED NOT DETECTED Final   Mycoplasma pneumoniae NOT DETECTED NOT DETECTED Final    Comment: Performed at [facility], [address], [place], [place] [address]     Radiology Studies: DG Chest [facility] 1 View Result Date:  03/22/2024 EXAM: 1 VIEW(S) XRAY OF THE CHEST 03/22/2024 06:02:00 PM COMPARISON: 03/22/2024 CLINICAL HISTORY: Acute hypoxemic respiratory failure (HCC) [id number] FINDINGS: LINES,  TUBES AND DEVICES: Stable right chest Port-A-Cath. LUNGS AND PLEURA: Improved perihilar interstitial pulmonary infiltrate. Persistent retrocardiac consolidation in keeping with atelectasis or infiltrate within this region. Small left pleural effusion. No pleural effusion on the right. No pneumothorax. HEART AND MEDIASTINUM: Stable cardiomegaly. BONES AND SOFT TISSUES: No acute osseous abnormality. IMPRESSION: 1. Improved perihilar interstitial pulmonary infiltrate and small left pleural effusion, suggesting changes of mild cardiogenic failure. 2. Persistent retrocardiac consolidation, in keeping with atelectasis or infiltrate within this region. Electronically signed by: Dorethia Molt MD 03/22/2024 11:29 PM EST RP Workstation: [id number]   DG Chest Port 1 View Result Date: 03/22/2024 EXAM: 1 VIEW(S) XRAY OF THE CHEST 03/22/2024 05:06:00 AM COMPARISON: 03/21/2024. CLINICAL HISTORY: Acute hypoxic respiratory failure (HCC) [id number] FINDINGS: LINES, TUBES AND DEVICES: Right port a cath tip at superior cavo / atrial junction. LUNGS AND PLEURA: Moderate interstitial edema, new or increased. Similar left base airspace disease. Suspect a similar tiny left pleural effusion. No pneumothorax. HEART AND MEDIASTINUM: Moderate cardiomegaly. BONES AND SOFT TISSUES: No acute osseous abnormality. IMPRESSION: 1. Cardiomegaly with new or increased moderate congestive heart failure. 2. Similar small left pleural effusion and adjacent airspace disease, which could represent atelectasis or infection. Electronically signed by: Rockey Kilts MD 03/22/2024 10:41 AM EST RP Workstation: [id number]    Scheduled Meds:  albuterol   2.5 mg Nebulization BID   ALPRAZolam   0.5 mg Oral QHS   Chlorhexidine  Gluconate Cloth  6 each Topical Daily   dextromethorphan -guaiFENesin   1 tablet Oral BID   DULoxetine   30 mg Oral Daily   feeding supplement  237 mL Oral TID BM   magic mouthwash w/lidocaine   5 mL Oral QID   metoprolol  tartrate  12.5 mg  Oral BID   [START ON 03/24/2024] multivitamin with minerals  1 tablet Oral Daily   sodium chloride  HYPERTONIC  4 mL Nebulization BID   thiamine   100 mg Oral Daily   Continuous Infusions:  amiodarone  30 mg/hr (03/23/24 1005)   cefTRIAXone  (ROCEPHIN )  IV 2 g (03/23/24 1724)   heparin  1,550 Units/hr (03/23/24 1056)   Time spent: 35 mins   LOS: 5 days  MDM: Patient is high risk for one or more organ failure.  They necessitate ongoing hospitalization for continued IV therapies and subsequent lab monitoring. Total time spent interpreting labs and vitals, reviewing the medical record, coordinating care amongst consultants and care team members, directly assessing and discussing care with the patient and/or family: 55 min Hannah Neises Maree, MD [facility]  To contact the attending physician between 7A-7P please use Epic Chat. To contact the covering physician during after hours 7P-7A, please review Amion.  03/23/2024, 8:38 PM   *This document has been created with the assistance of dictation software. Please excuse typographical errors. *

## 2024-03-23 NOTE — Progress Notes (Addendum)
 Skin check upon transfer: Buttock has dark skin pigmentation with a very small open area.  Additionally, red areas along mid abdomen. MD aware, LDA created, wound consult added and standing orders activated.

## 2024-03-23 NOTE — Progress Notes (Signed)
 Patient assisted to [facility] with steedy lift. Tolerated well.

## 2024-03-23 NOTE — Progress Notes (Signed)
 ANTICOAGULATION CONSULT NOTE  Pharmacy Consult for heparin  infusion Indication: NSTEMI and new onset Afib RVR with PE  Allergies  Allergen Reactions   Ezetimibe Other (See Comments)    Body aches, and stiffness   Codeine Diarrhea   Cortisone Swelling    injections   Lovastatin Other (See Comments)    Muscle cramps    Patient Measurements: Height: 5' (152.4 cm) Weight: 72.2 kg (159 lb 2.8 oz) IBW/kg (Calculated) : 45.5 HEPARIN  DW (KG): 62.2  Vital Signs: Temp: 99 F (37.2 C) (11/19 0400) Temp Source: Axillary (11/19 0400) BP: 151/62 (11/19 0500) Pulse Rate: 90 (11/19 0500)  Labs: Recent Labs    03/20/24 0624 03/20/24 1509 03/21/24 0451 03/21/24 1519 03/21/24 2345 03/22/24 0442 03/23/24 0425  HGB 9.1*  --  8.0*  --   --  8.5* 8.1*  HCT 28.3*  --  26.2*  --   --  27.4* 25.9*  PLT 457*  --  446*  --   --  414* 414*  HEPARINUNFRC 0.29*   < > 0.28*   < > 0.57 0.62 0.43  CREATININE 0.71  --  0.63  --   --  0.87  --    < > = values in this interval not displayed.    Estimated Creatinine Clearance: 46.5 mL/min (by C-G formula based on SCr of 0.87 mg/dL).   Medical History: Past Medical History:  Diagnosis Date   Acid reflux    Anemia    Anesthesia complication    Tachycardia previously, now on metoprolol    Arthritis    09/02/2019: per patient have it real bad in both hands and back   Difficult intubation    had to terminate intubation unable to advance tube for cholecystectomy 2017?   DVT (deep venous thrombosis) (HCC)    leg   Hypertension    Sciatica    09/02/2019: has had it for about 3 years    Assessment: Pt is a 78 yo female presenting to ED c/o SOB and fall, found with new onset Afib , found with elevated Troponin level trending up and BNP.  Goal of Therapy:  Heparin  level 0.3-0.7 units/ml Monitor platelets by anticoagulation protocol: Yes  11/15 0326 HL 0.30, therapeutic x 1 11/15 1123 HL 0.25, subtherapeutic@950  units/hr 11/15 2007 HL 0.26,  subtherapeutic @ 1100 units/hr 11/16 0624 HL 0.29, subtherapeutic 11/16 1509 HL 0.32, therapeutic x 1 11/17 0001 HL 0.30, therapeutic x 2 11/17 0451 HL 0.28, subtherapeutic 11/17 1519 HL 0.37, therapeutic x 1 11/17 2345 HL 0.57, therapeutic X 2  11/18 0442 HL 0.62, therapeutic X 3 11/19 0425 HL 0.43, therapeutic X 4    Plan:  11/19:  HL @ 0425 = 0.43, therapeutic X 4  - Will continue pt on current rate and recheck HL on 11/20 with AM labs  Daily CBC while on heparin   Jasiel Belisle D, PharmD 03/23/2024 5:05 AM

## 2024-03-23 NOTE — Progress Notes (Signed)
 Patient admitted to PCU from ICU. Upon arrival, patient placed on continuous cardiac telemetry monitoring. Vital signs obtained and documented. Patient oriented to room, call bell system, and unit routines. Safety measures implemented: bed in lowest position, call bell in reach, and side rails up x3. Patient repositioned for comfort and skin protection. Initial head-to-toes assessment completed. Patient resting comfortably in bed, no acute distress noted at this time.

## 2024-03-23 NOTE — Progress Notes (Signed)
 Dr. Maree notified foley catheter not flowing despite flushing. Bladder scanned, showed 500 mL. Foley removed. External catheter placed. Patient encouraged to attempt to urinate.

## 2024-03-24 ENCOUNTER — Telehealth (HOSPITAL_COMMUNITY): Payer: Self-pay | Admitting: Pharmacy Technician

## 2024-03-24 ENCOUNTER — Other Ambulatory Visit (HOSPITAL_COMMUNITY): Payer: Self-pay

## 2024-03-24 ENCOUNTER — Ambulatory Visit

## 2024-03-24 DIAGNOSIS — I2699 Other pulmonary embolism without acute cor pulmonale: Secondary | ICD-10-CM | POA: Diagnosis not present

## 2024-03-24 DIAGNOSIS — I2602 Saddle embolus of pulmonary artery with acute cor pulmonale: Secondary | ICD-10-CM | POA: Diagnosis not present

## 2024-03-24 DIAGNOSIS — J9601 Acute respiratory failure with hypoxia: Secondary | ICD-10-CM | POA: Diagnosis not present

## 2024-03-24 DIAGNOSIS — N179 Acute kidney failure, unspecified: Secondary | ICD-10-CM | POA: Diagnosis not present

## 2024-03-24 LAB — CBC
HCT: 27.6 % — ABNORMAL LOW (ref 36.0–46.0)
Hemoglobin: 8.7 g/dL — ABNORMAL LOW (ref 12.0–15.0)
MCH: 25.1 pg — ABNORMAL LOW (ref 26.0–34.0)
MCHC: 31.5 g/dL (ref 30.0–36.0)
MCV: 79.5 fL — ABNORMAL LOW (ref 80.0–100.0)
Platelets: 402 K/uL — ABNORMAL HIGH (ref 150–400)
RBC: 3.47 MIL/uL — ABNORMAL LOW (ref 3.87–5.11)
RDW: 23.3 % — ABNORMAL HIGH (ref 11.5–15.5)
WBC: 19.5 K/uL — ABNORMAL HIGH (ref 4.0–10.5)
nRBC: 0.2 % (ref 0.0–0.2)

## 2024-03-24 LAB — BASIC METABOLIC PANEL WITH GFR
Anion gap: 9 (ref 5–15)
BUN: 11 mg/dL (ref 8–23)
CO2: 27 mmol/L (ref 22–32)
Calcium: 8.3 mg/dL — ABNORMAL LOW (ref 8.9–10.3)
Chloride: 92 mmol/L — ABNORMAL LOW (ref 98–111)
Creatinine, Ser: 0.58 mg/dL (ref 0.44–1.00)
GFR, Estimated: >60 mL/min (ref >60)
Glucose, Bld: 120 mg/dL — ABNORMAL HIGH (ref 70–99)
Potassium: 4 mmol/L (ref 3.5–5.1)
Sodium: 128 mmol/L — ABNORMAL LOW (ref 135–145)

## 2024-03-24 LAB — PHOSPHORUS: Phosphorus: 2.7 mg/dL (ref 2.5–4.6)

## 2024-03-24 LAB — MAGNESIUM: Magnesium: 1.8 mg/dL (ref 1.7–2.4)

## 2024-03-24 LAB — HEPARIN LEVEL (UNFRACTIONATED)
Heparin Unfractionated: 0.29 [IU]/mL — ABNORMAL LOW (ref 0.30–0.70)
Heparin Unfractionated: 0.4 [IU]/mL (ref 0.30–0.70)

## 2024-03-24 MED ORDER — ACETAMINOPHEN 325 MG PO TABS
650.0000 mg | ORAL_TABLET | Freq: Four times a day (QID) | ORAL | Status: DC | PRN
  Administered 2024-03-26 – 2024-03-27 (×4): 650 mg via ORAL
  Filled 2024-03-24 (×5): qty 2

## 2024-03-24 MED ORDER — AMIODARONE HCL 200 MG PO TABS
200.0000 mg | ORAL_TABLET | Freq: Every day | ORAL | Status: DC
  Administered 2024-03-31 – 2024-04-03 (×4): 200 mg via ORAL
  Filled 2024-03-24 (×4): qty 1

## 2024-03-24 MED ORDER — HEPARIN BOLUS VIA INFUSION
950.0000 [IU] | Freq: Once | INTRAVENOUS | Status: AC
  Administered 2024-03-24: 950 [IU] via INTRAVENOUS
  Filled 2024-03-24: qty 950

## 2024-03-24 MED ORDER — MAGNESIUM SULFATE 2 GM/50ML IV SOLN
2.0000 g | Freq: Once | INTRAVENOUS | Status: AC
  Administered 2024-03-24: 2 g via INTRAVENOUS
  Filled 2024-03-24: qty 50

## 2024-03-24 MED ORDER — AMIODARONE HCL 200 MG PO TABS
400.0000 mg | ORAL_TABLET | Freq: Two times a day (BID) | ORAL | Status: AC
Start: 2024-03-24 — End: 2024-03-30
  Administered 2024-03-24 – 2024-03-30 (×14): 400 mg via ORAL
  Filled 2024-03-24 (×14): qty 2

## 2024-03-24 MED ORDER — METOPROLOL TARTRATE 25 MG PO TABS
25.0000 mg | ORAL_TABLET | Freq: Two times a day (BID) | ORAL | Status: DC
  Administered 2024-03-24 – 2024-04-03 (×21): 25 mg via ORAL
  Filled 2024-03-24 (×21): qty 1

## 2024-03-24 NOTE — Progress Notes (Addendum)
 ANTICOAGULATION CONSULT NOTE  Pharmacy Consult for heparin  infusion Indication: NSTEMI and new onset Afib RVR with PE  Allergies  Allergen Reactions   Ezetimibe Other (See Comments)    Body aches, and stiffness   Codeine Diarrhea   Cortisone Swelling    injections   Lovastatin Other (See Comments)    Muscle cramps    Patient Measurements: Height: 5' (152.4 cm) Weight: 72 kg (158 lb 11.7 oz) IBW/kg (Calculated) : 45.5 HEPARIN  DW (KG): 62.2  Vital Signs: Temp: 97.7 F (36.5 C) (11/20 1147) Temp Source: Oral (11/20 0804) BP: 144/64 (11/20 1147) Pulse Rate: 69 (11/20 1147)  Labs: Recent Labs    03/22/24 0442 03/23/24 0425 03/24/24 0335 03/24/24 1200  HGB 8.5* 8.1* 8.7*  --   HCT 27.4* 25.9* 27.6*  --   PLT 414* 414* 402*  --   HEPARINUNFRC 0.62 0.43 0.29* 0.40  CREATININE 0.87 0.65 0.58  --     Estimated Creatinine Clearance: 50.5 mL/min (by C-G formula based on SCr of 0.58 mg/dL).   Medical History: Past Medical History:  Diagnosis Date   Acid reflux    Anemia    Anesthesia complication    Tachycardia previously, now on metoprolol    Arthritis    09/02/2019: per patient have it real bad in both hands and back   Difficult intubation    had to terminate intubation unable to advance tube for cholecystectomy 2017?   DVT (deep venous thrombosis) (HCC)    leg   Hypertension    Sciatica    09/02/2019: has had it for about 3 years    Assessment: Pt is a 78 yo female presenting to ED c/o SOB and fall, found with new onset Afib , found with elevated Troponin level trending up and BNP.  Goal of Therapy:  Heparin  level 0.3-0.7 units/ml Monitor platelets by anticoagulation protocol: Yes  11/15 0326 HL 0.30, therapeutic x 1 11/15 1123 HL 0.25, subtherapeutic@950  units/hr 11/15 2007 HL 0.26, subtherapeutic @ 1100 units/hr 11/16 0624 HL 0.29, subtherapeutic 11/16 1509 HL 0.32, therapeutic x 1 11/17 0001 HL 0.30, therapeutic x 2 11/17 0451 HL 0.28,  subtherapeutic 11/17 1519 HL 0.37, therapeutic x 1 11/17 2345 HL 0.57, therapeutic X 2  11/18 0442 HL 0.62, therapeutic X 3 11/19 0425 HL 0.43, therapeutic X 4  11/20 0335 HL 0.29, SUBtherapeutic 11/20 1200 HL 0.4    Plan:  Heparin  level is therapeutic. Will continue heparin  infusion at 1650 units/hr. Recheck heparin  level and CBC with AM labs.    Cathaleen GORMAN Blanch, PharmD 03/24/2024 12:59 PM

## 2024-03-24 NOTE — Consult Note (Addendum)
 WOC Nurse Consult Note: Reason for Consult: Requested to assess sacrum injury. Wound type: DTPI with friction associated (kissing injury means opposite formed injuries). Pressure Injury POA: No Measurement: bilateral gluteal cleft  Wound bed: brown skin with partial thickness on the left, coccyx area with intact purple skin. Drainage (amount, consistency, odor) none, scant amount in the denudation area. Periwound: intact, marron coloration. Dressing procedure/placement/frequency: Cleanse the area with warm water  and soft wash cloth, pat dry. Cover with silicon foam dressing, change every 3 days or PRN soiled.  Bed nurse recommendations: - Turn and repositioning per hospital policy - If the pt is able to go in the chair, add a chair pressure retribution pad.   WOC team will follow weekly. Please reconsult if further assistance is needed. Thank-you,  Lela Holm MSN, RN, CNS.  (Phone [phone])

## 2024-03-24 NOTE — Telephone Encounter (Signed)
 Patient Product/process development scientist completed.    The patient is insured through [employer]. Patient has Medicare and is not eligible for a copay card, but may be able to apply for patient assistance or Medicare RX Payment Plan (Patient Must reach out to their plan, if eligible for payment plan), if available.    Ran test claim for Eliquis 5 mg and the current 30 day co-pay is $0.00.   This test claim was processed through [facility]- copay amounts may vary at other pharmacies due to pharmacy/plan contracts, or as the patient moves through the different stages of their insurance plan.     Reyes Sharps, CPHT Pharmacy Technician Patient Advocate Specialist Lead [facility] Patient Advocate Team Direct Number: [phone]  Fax: [phone]

## 2024-03-24 NOTE — Progress Notes (Signed)
 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  TTE with RV strain currently on heparin  drip.  Course complicated by A-fib with RVR requiring amiodarone drip. Initially required levophed gtt which has been wean off. Cardiology following. Hospital course as below  11/19: transfer to PCU, wound care c/s 11/20:    Subjective:  Feeling better, less SOB, sitting in the bed. Daughter at bedside  Objective: Vitals:   03/24/24 1147 03/24/24 1524 03/24/24 1942 03/24/24 2034  BP: (!) 144/64 (!) 155/67  (!) 141/57  Pulse: 69 73  66  Resp: 19 19  (!) 22  Temp: 97.7 F (36.5 C) 98.1 F (36.7 C)  98.2 F (36.8 C)  TempSrc:      SpO2: 97% 100% 99% 99%  Weight:      Height:        Intake/Output Summary (Last 24 hours) at 03/24/2024 2041 Last data filed at 03/24/2024 1510 Gross per 24 hour  Intake 2063.22 ml  Output 1050 ml  Net 1013.22 ml   Filed Weights   03/22/24 0439 03/23/24 0400 03/24/24 0500  Weight: 68.3 kg 72.2 kg 72 kg    Examination: General: Acute on chronically-ill appearing female, on HFNC HENT: Supple, no JVD Lungs: Faint rhonchi throughout, even, non labored  Cardiovascular: Irregular irregular, no m/r/g, 1+ bilateral lower extremity edema  Abdomen: +BS x4, non distended, non tender  Extremities: Normal bulk and tone, moves all extremities  Neuro: Alert and oriented, follows commands GU: Indwelling foley  catheter draining yellow urine   Assessment & Plan:  Acute segmental PE - US  shows nonocclusive thrombus in right posterior tibial and peroneal vein - IR consulted for acute segmental PE clot burden minimal and thrombolysis contraindicated due to brain metastasis   - Continue heparin  gtt, change to Eliquis per cardio - may be tomorrow  Community Acquired Pneumonia - RVP negative, MRSA nares negative - Blood culture 11/14 NGTD - Completed Abx for 5 days - worsening leukocytosis  Acute hypoxic respiratory failure  - Suspect likely due to acute segmental PE, possible pneumonia, A fib, volume overload - Was changed from BiPAP to HFNC 9 l - Wean oxygen as able  Atrial fibrillation with RVR Cardiogenic shock, pulmonary HTN - Echo 03/19/24: EF 60 to 65%, grade I diastolic dysfunction, right ventricle size moderately enlarged, severely elevated pulmonary artery systolic pressures, severe tricuspid valve regurgitation - Cardio transition IV to p.o. amiodarone load with 400 mg twice daily for 7 days followed by 200 mg daily. - Increase metoprolol  to 25 mg twice daily.   Metastatic adenocarcinoma  - Seen by oncology, appreciate recs - New severe T6 compression fracture - XR hip increased lucency at the acetabulum on the left, likely related to metastatic disease.  Possibility of superimposed fracture - Oncology and Palliative care following  Hyponatremia  - Sodium improved from 117 on presentation, slowly up to 128.   AKI - resolved - Monitor Cr  - PT/OT recommends SNF. Patient  declines and would like to go home with [facility] - aide as well as PT, OT, RN   DVT prophylaxis: IV Heparin    Code Status: Limited: Do not attempt resuscitation (DNR) -DNR-LIMITED -Do Not Intubate/DNI  Disposition:  possible DC in next 4-5 days depending on clinical condition  Consultants:  PCCM Cardiology Onco  Antimicrobials:  Anti-infectives (From admission, onward)    Start     Dose/Rate Route Frequency  Ordered Stop   03/22/24 1815  azithromycin (ZITHROMAX) 500 mg in sodium chloride  0.9 % 250 mL IVPB        500 mg 250 mL/hr over 60 Minutes Intravenous Every 24 hours 03/22/24 1256 03/22/24 1833   03/22/24 1600  cefTRIAXone (ROCEPHIN) 2 g in sodium chloride  0.9 % 100 mL IVPB        2 g 200 mL/hr over 30 Minutes Intravenous Every 24 hours 03/22/24 1449 03/24/24 1507   03/20/24 0600  vancomycin (VANCOREADY) IVPB 750 mg/150 mL  Status:  Discontinued       Placed in Followed by Linked Group   750 mg 150 mL/hr over 60 Minutes Intravenous Every 24 hours 03/19/24 0530 03/19/24 1049   03/19/24 1815  azithromycin (ZITHROMAX) 500 mg in sodium chloride  0.9 % 250 mL IVPB  Status:  Discontinued        500 mg 250 mL/hr over 60 Minutes Intravenous Every 24 hours 03/19/24 1716 03/22/24 1256   03/19/24 1400  cefTRIAXone (ROCEPHIN) 2 g in sodium chloride  0.9 % 100 mL IVPB        2 g 200 mL/hr over 30 Minutes Intravenous Every 24 hours 03/19/24 1049 03/22/24 2359   03/19/24 0800  ceFEPIme (MAXIPIME) 2 g in sodium chloride  0.9 % 100 mL IVPB  Status:  Discontinued        2 g 200 mL/hr over 30 Minutes Intravenous Every 12 hours 03/19/24 0523 03/19/24 1049   03/19/24 0615  vancomycin (VANCOREADY) IVPB 1750 mg/350 mL       Placed in Followed by Linked Group   1,750 mg 175 mL/hr over 120 Minutes Intravenous  Once 03/19/24 0530 03/19/24 0812   03/18/24 1945  cefTRIAXone (ROCEPHIN) 2 g in sodium chloride  0.9 % 100 mL IVPB        2 g 200 mL/hr over 30 Minutes Intravenous  Once 03/18/24 1930 03/18/24 2202   03/18/24 1945  azithromycin (ZITHROMAX) 500 mg in sodium chloride  0.9 % 250 mL IVPB        500 mg 250 mL/hr over 60 Minutes Intravenous  Once 03/18/24 1930 03/19/24 0001       Data Reviewed: I have personally reviewed following labs and imaging studies CBC: Recent Labs  Lab 03/18/24 1850 03/19/24 0326 03/20/24 0624 03/21/24 0451 03/22/24 0442 03/23/24 0425 03/24/24 0335  WBC 27.3*   < > 20.6*  15.1* 16.7* 25.1* 19.5*  NEUTROABS 24.0*  --   --   --   --   --   --   HGB 9.9*   < > 9.1* 8.0* 8.5* 8.1* 8.7*  HCT 30.7*   < > 28.3* 26.2* 27.4* 25.9* 27.6*  MCV 76.2*   < > 77.7* 79.6* 79.2* 78.7* 79.5*  PLT 465*   < > 457* 446* 414* 414* 402*   < > = values in this interval not displayed.   Basic Metabolic Panel: Recent Labs  Lab 03/19/24 0326 03/20/24 0624 03/21/24 0451 03/22/24 0442 03/23/24 0425 03/24/24 0335  NA 120* 129* 129* 127* 127* 128*  K 4.6  4.2 4.4 4.3 4.3 4.0  CL 87* 92* 95* 92* 95* 92*  CO2 20* 22 25 25 25 27   GLUCOSE 165* 103* 112* 124* 122* 120*  BUN 37* 25* 17 19 16 11   CREATININE 1.14* 0.71 0.63 0.87 0.65 0.58  CALCIUM 7.7* 8.5* 8.5* 8.4* 8.2* 8.3*  MG 2.0 2.1  --  2.1 2.0 1.8  PHOS 4.5 3.6  --  3.0 2.3* 2.7   GFR: Estimated Creatinine Clearance: 50.5 mL/min (by C-G formula based on SCr of 0.58 mg/dL). Liver Function Tests: Recent Labs  Lab 03/18/24 1850 03/21/24 0451  AST 54* 26  ALT 30 27  ALKPHOS 228* 240*  BILITOT 0.3 0.2  PROT 6.7 5.8*  ALBUMIN  3.0* 2.7*   CBG: Recent Labs  Lab 03/19/24 0015  GLUCAP 173*    Recent Results (from the past 240 hours)  Blood Culture (routine x 2)     Status: None   Collection Time: 03/18/24  6:00 PM   Specimen: BLOOD  Result Value Ref Range Status   Specimen Description BLOOD RIGHT ANTECUBITAL  Final   Special Requests   Final    BOTTLES DRAWN AEROBIC AND ANAEROBIC Blood Culture results may not be optimal due to an inadequate volume of blood received in culture bottles   Culture   Final    NO GROWTH 5 DAYS Performed at [facility], [address] [place], [place] [address]    Report Status 03/23/2024 FINAL  Final  Blood Culture (routine x 2)     Status: None   Collection Time: 03/18/24  6:05 PM   Specimen: BLOOD  Result Value Ref Range Status   Specimen Description BLOOD BLOOD LEFT HAND  Final   Special Requests   Final    BOTTLES DRAWN AEROBIC AND ANAEROBIC Blood Culture results  may not be optimal due to an inadequate volume of blood received in culture bottles   Culture   Final    NO GROWTH 5 DAYS Performed at [facility], [address] [place], [place] [address]    Report Status 03/23/2024 FINAL  Final  MRSA Next Gen by PCR, Nasal     Status: None   Collection Time: 03/19/24  5:04 AM   Specimen: Anterior Nasal Swab  Result Value Ref Range Status   MRSA by PCR Next Gen NOT DETECTED NOT DETECTED Final    Comment: (NOTE) The GeneXpert MRSA Assay (FDA approved for NASAL specimens only), is one component of a comprehensive MRSA colonization surveillance program. It is not intended to diagnose MRSA infection nor to guide or monitor treatment for MRSA infections. Test performance is not FDA approved in patients less than 41 years old. Performed at [facility], [address] [place], [place] [address]   Resp panel by RT-PCR (RSV, Flu A&B, Covid) Anterior Nasal Swab     Status: None   Collection Time: 03/19/24  5:04 AM   Specimen: Anterior Nasal Swab  Result Value Ref Range Status   SARS Coronavirus 2 by RT PCR NEGATIVE NEGATIVE Final    Comment: (NOTE) SARS-CoV-2 target nucleic acids are NOT DETECTED.  The SARS-CoV-2 RNA is generally detectable in upper respiratory specimens during the acute phase of infection. The lowest concentration of SARS-CoV-2 viral copies this assay can detect is 138 copies/mL. A negative result does not preclude SARS-Cov-2 infection and should not be used as the sole basis for treatment or other patient management decisions. A negative result may occur with  improper specimen collection/handling,  submission of specimen other than nasopharyngeal swab, presence of viral mutation(s) within the areas targeted by this assay, and inadequate number of viral copies(<138 copies/mL). A negative result must be combined with clinical observations, patient history, and epidemiological information. The expected result is  Negative.  Fact Sheet for Patients:  bloggercourse.com  Fact Sheet for Healthcare Providers:  seriousbroker.it  This test is no t yet approved or cleared by the [place]  FDA and  has been authorized for detection and/or diagnosis of SARS-CoV-2 by FDA under an Emergency Use Authorization (EUA). This EUA will remain  in effect (meaning this test can be used) for the duration of the COVID-19 declaration under Section 564(b)(1) of the Act, 21 U.S.C.section 360bbb-3(b)(1), unless the authorization is terminated  or revoked sooner.       Influenza A by PCR NEGATIVE NEGATIVE Final   Influenza B by PCR NEGATIVE NEGATIVE Final    Comment: (NOTE) The Xpert Xpress SARS-CoV-2/FLU/RSV plus assay is intended as an aid in the diagnosis of influenza from Nasopharyngeal swab specimens and should not be used as a sole basis for treatment. Nasal washings and aspirates are unacceptable for Xpert Xpress SARS-CoV-2/FLU/RSV testing.  Fact Sheet for Patients: bloggercourse.com  Fact Sheet for Healthcare Providers: seriousbroker.it  This test is not yet approved or cleared by the [place]  FDA and has been authorized for detection and/or diagnosis of SARS-CoV-2 by FDA under an Emergency Use Authorization (EUA). This EUA will remain in effect (meaning this test can be used) for the duration of the COVID-19 declaration under Section 564(b)(1) of the Act, 21 U.S.C. section 360bbb-3(b)(1), unless the authorization is terminated or revoked.     Resp Syncytial Virus by PCR NEGATIVE NEGATIVE Final    Comment: (NOTE) Fact Sheet for Patients: bloggercourse.com  Fact Sheet for Healthcare Providers: seriousbroker.it  This test is not yet approved or cleared by the [place]  FDA and has been authorized for detection and/or diagnosis of  SARS-CoV-2 by FDA under an Emergency Use Authorization (EUA). This EUA will remain in effect (meaning this test can be used) for the duration of the COVID-19 declaration under Section 564(b)(1) of the Act, 21 U.S.C. section 360bbb-3(b)(1), unless the authorization is terminated or revoked.  Performed at [facility], [address] [place], [place] [address]   Respiratory (~20 pathogens) panel by PCR     Status: None   Collection Time: 03/21/24  9:37 AM   Specimen: Nasopharyngeal Swab; Respiratory  Result Value Ref Range Status   Adenovirus NOT DETECTED NOT DETECTED Final   Coronavirus 229E NOT DETECTED NOT DETECTED Final    Comment: (NOTE) The Coronavirus on the Respiratory Panel, DOES NOT test for the novel  Coronavirus (2019 nCoV)    Coronavirus HKU1 NOT DETECTED NOT DETECTED Final   Coronavirus NL63 NOT DETECTED NOT DETECTED Final   Coronavirus OC43 NOT DETECTED NOT DETECTED Final   Metapneumovirus NOT DETECTED NOT DETECTED Final   Rhinovirus / Enterovirus NOT DETECTED NOT DETECTED Final   Influenza A NOT DETECTED NOT DETECTED Final   Influenza B NOT DETECTED NOT DETECTED Final   Parainfluenza Virus 1 NOT DETECTED NOT DETECTED Final   Parainfluenza Virus 2 NOT DETECTED NOT DETECTED Final   Parainfluenza Virus 3 NOT DETECTED NOT DETECTED Final   Parainfluenza Virus 4 NOT DETECTED NOT DETECTED Final   Respiratory Syncytial Virus NOT DETECTED NOT DETECTED Final   Bordetella pertussis NOT DETECTED NOT DETECTED Final   Bordetella Parapertussis NOT DETECTED NOT DETECTED Final   Chlamydophila  pneumoniae NOT DETECTED NOT DETECTED Final   Mycoplasma pneumoniae NOT DETECTED NOT DETECTED Final    Comment: Performed at [facility], [address] [place], [place] [address]     Radiology Studies: No results found.   Scheduled Meds:  albuterol  2.5 mg Nebulization BID   ALPRAZolam  0.5 mg Oral QHS   amiodarone  400 mg Oral BID   Followed by   NOREEN ON  03/31/2024] amiodarone  200 mg Oral Daily   Chlorhexidine  Gluconate Cloth  6 each Topical Daily   dextromethorphan-guaiFENesin  1 tablet Oral BID   DULoxetine  30 mg Oral Daily   feeding supplement  237 mL Oral TID BM   magic mouthwash w/lidocaine   5 mL Oral QID   metoprolol  tartrate  25 mg Oral BID   multivitamin with minerals  1 tablet Oral Daily   sodium chloride  HYPERTONIC  4 mL Nebulization BID   thiamine  100 mg Oral Daily   Continuous Infusions:  heparin  1,650 Units/hr (03/24/24 1510)   Time spent: 35 mins   LOS: 6 days  MDM: Patient is high risk for one or more organ failure.  They necessitate ongoing hospitalization for continued IV therapies and subsequent lab monitoring. Total time spent interpreting labs and vitals, reviewing the medical record, coordinating care amongst consultants and care team members, directly assessing and discussing care with the patient and/or family: 55 min Brogen Duell Maree, MD [facility]  To contact the attending physician between 7A-7P please use Epic Chat. To contact the covering physician during after hours 7P-7A, please review Amion.  03/24/2024, 8:41 PM   *This document has been created with the assistance of dictation software. Please excuse typographical errors. *

## 2024-03-24 NOTE — Progress Notes (Signed)
 ANTICOAGULATION CONSULT NOTE  Pharmacy Consult for heparin  infusion Indication: NSTEMI and new onset Afib RVR with PE  Allergies  Allergen Reactions   Ezetimibe Other (See Comments)    Body aches, and stiffness   Codeine Diarrhea   Cortisone Swelling    injections   Lovastatin Other (See Comments)    Muscle cramps    Patient Measurements: Height: 5' (152.4 cm) Weight: 72.2 kg (159 lb 2.8 oz) IBW/kg (Calculated) : 45.5 HEPARIN  DW (KG): 62.2  Vital Signs: Temp: 96.8 F (36 C) (11/20 0315) BP: 141/73 (11/20 0315) Pulse Rate: 112 (11/20 0315)  Labs: Recent Labs    03/21/24 0451 03/21/24 1519 03/22/24 0442 03/23/24 0425 03/24/24 0335  HGB 8.0*  --  8.5* 8.1* 8.7*  HCT 26.2*  --  27.4* 25.9* 27.6*  PLT 446*  --  414* 414* 402*  HEPARINUNFRC 0.28*   < > 0.62 0.43 0.29*  CREATININE 0.63  --  0.87 0.65  --    < > = values in this interval not displayed.    Estimated Creatinine Clearance: 50.6 mL/min (by C-G formula based on SCr of 0.65 mg/dL).   Medical History: Past Medical History:  Diagnosis Date   Acid reflux    Anemia    Anesthesia complication    Tachycardia previously, now on metoprolol    Arthritis    09/02/2019: per patient have it real bad in both hands and back   Difficult intubation    had to terminate intubation unable to advance tube for cholecystectomy 2017?   DVT (deep venous thrombosis) (HCC)    leg   Hypertension    Sciatica    09/02/2019: has had it for about 3 years    Assessment: Pt is a 78 yo female presenting to ED c/o SOB and fall, found with new onset Afib , found with elevated Troponin level trending up and BNP.  Goal of Therapy:  Heparin  level 0.3-0.7 units/ml Monitor platelets by anticoagulation protocol: Yes  11/15 0326 HL 0.30, therapeutic x 1 11/15 1123 HL 0.25, subtherapeutic@950  units/hr 11/15 2007 HL 0.26, subtherapeutic @ 1100 units/hr 11/16 0624 HL 0.29, subtherapeutic 11/16 1509 HL 0.32, therapeutic x 1 11/17 0001  HL 0.30, therapeutic x 2 11/17 0451 HL 0.28, subtherapeutic 11/17 1519 HL 0.37, therapeutic x 1 11/17 2345 HL 0.57, therapeutic X 2  11/18 0442 HL 0.62, therapeutic X 3 11/19 0425 HL 0.43, therapeutic X 4  11/20 0335 HL 0.29, SUBtherapeutic   Plan:  11/20: HL @ 0335 = 0.29, SUBtherapeutic  - will order heparin  950 units IV X 1 bolus and increase drip rate to 1650 units/hr  - recheck HL 8 hrs after rate change  Daily CBC while on heparin   Leonore Frankson D, PharmD 03/24/2024 4:21 AM

## 2024-03-24 NOTE — TOC Progression Note (Signed)
 Transition of Care [facility]) - Progression Note    Patient Details  Name: Hannah Yu MRN: [id number] Date of Birth: March 10, 1946  Transition of Care [facility]) CM/SW Contact  Shasta DELENA Daring, RN Phone Number: 03/24/2024, 4:01 PM  Clinical Narrative:     RNCM assessed patient. Sister was at bedside. Confirmed that she wants to be discharged home and is not considering SNF. Confirmed she is amenable to home health PT, OT, nursing and DME of hospital bed, hoyer lift, wheelchair and 3-in-1 walker. Will follow for discharge readiness.  1447:  Spoke with patient's brother (who she lives with). He said a representative from [facility] will visit patient today. Confirmed he is aware of DME orders. He said he will make space before delivery.   1549: Spoke with Shaun/[facility]. He said patient brother has requested an aide as well as PT, OT, RN [facility] services. Sent message to attending.                      Expected Discharge Plan and Services                                               Social Drivers of Health (SDOH) Interventions SDOH Screenings   Food Insecurity: No Food Insecurity (03/20/2024)  Housing: Low Risk  (03/20/2024)  Transportation Needs: No Transportation Needs (03/20/2024)  Utilities: Not At Risk (03/20/2024)  Depression (PHQ2-9): Low Risk  (02/24/2024)  Financial Resource Strain: Low Risk  (12/08/2023)   Received from [facility]  Social Connections: Moderately Isolated (03/20/2024)  Tobacco Use: Medium Risk (03/18/2024)    Readmission Risk Interventions     No data to display

## 2024-03-24 NOTE — Consult Note (Signed)
 PHARMACY CONSULT NOTE - ELECTROLYTES  Pharmacy Consult for Electrolyte Monitoring and Replacement   Recent Labs: Height: 5' (152.4 cm) Weight: 72 kg (158 lb 11.7 oz) IBW/kg (Calculated) : 45.5 Estimated Creatinine Clearance: 50.5 mL/min (by C-G formula based on SCr of 0.58 mg/dL). Potassium (mmol/L)  Date Value  03/24/2024 4.0   Magnesium (mg/dL)  Date Value  88/79/7974 1.8   Calcium (mg/dL)  Date Value  88/79/7974 8.3 (L)   Albumin  (g/dL)  Date Value  88/82/7974 2.7 (L)   Phosphorus (mg/dL)  Date Value  88/79/7974 2.7   Sodium (mmol/L)  Date Value  03/24/2024 128 (L)   Corrected Ca: 9.2 mg/dL  Assessment  Hannah Yu is a 78 y.o. female presenting with shortness of breath. 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 Pharmacy has been consulted to monitor and replace electrolytes.  Diuretics: furosemide  40 mg IV once daily  Goal of Therapy:  Potassium 4.0 - 5.1 mmol/L Magnesium 2.0 - 2.4 mg/dL All Other Electrolytes WNL  Plan:  Mg 2 g IV x 1 Pt transferred from ICU. Pharmacy will sign off. Please re-consult if needed.   Thank you for allowing pharmacy to be a part of this patient's care.  Cathaleen GORMAN Blanch, PharmD, BCPS Clinical Pharmacist 03/24/2024 6:34 AM

## 2024-03-24 NOTE — Progress Notes (Signed)
 Occupational Therapy Treatment Patient Details Name: Hannah Yu MRN: [id number] DOB: [birth year] Today's Date: 03/24/2024   History of present illness 78 y/o female presented to ED on 03/18/24 after fall and hypoxia. Admitted for acute hypoxic respiratory failure with acute segmental PE. 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 Pt is making progress towards goals, she was able to tolerate sitting on edge of bed for approx 5 minutes with fair-poor sitting balance. Increased forward flexion with fatigue noted. Spo2 >90% on 8L via [place] throughout.  Pt continues to perform ADL/functional mobility below PLOF, will benefit from acute OT to address deficits and to facilitate return to PLOF.       If plan is discharge home, recommend the following:  Two people to help with walking and/or transfers;Two people to help with bathing/dressing/bathroom   Equipment Recommendations  BSC/3in1;Hoyer lift;Wheelchair (measurements OT);Wheelchair cushion (measurements OT);Hospital bed    Recommendations for Other Services      Precautions / Restrictions Precautions Precautions: Fall Recall of Precautions/Restrictions: Intact Restrictions Weight Bearing Restrictions Per Provider Order: No       Mobility Bed Mobility Overal bed mobility: Needs Assistance Bed Mobility: Supine to Sit, Sit to Supine     Supine to sit: Max assist, +2 for physical assistance Sit to supine: Max assist, +2 for physical assistance        Transfers                         Balance Overall balance assessment: Needs assistance Sitting-balance support: Feet supported Sitting balance-Leahy  Scale: Poor Sitting balance - Comments: anterior lean with trunk flexion with fatigue, sat on edge of bed for approx 5 minutes                                   ADL either performed or assessed with clinical judgement   ADL Overall ADL's : Needs assistance/impaired     Grooming: Minimal assistance;Oral care;Wash/dry face;Bed level               Lower Body Dressing: Maximal assistance;Bed level                      Extremity/Trunk Assessment              Vision       Perception     Praxis     Communication Communication Communication: No apparent difficulties   Cognition Arousal: Alert Behavior During Therapy: WFL for tasks assessed/performed Cognition: Cognition impaired         Attention impairment (select first level of impairment): Sustained attention                     Following commands: Intact        Cueing   Cueing Techniques: Verbal cues  Exercises Other Exercises Other Exercises: edu pt/friend re: role of OT, role of rehab, pressure injury prevention    Shoulder Instructions       General Comments spo2 92-94% on 8L via ;    Pertinent Vitals/ Pain       Pain Assessment Pain  Assessment: Faces Faces Pain Scale: Hurts little more Pain Location: L thigh Pain Descriptors / Indicators: Discomfort, Grimacing  Home Living                                          Prior Functioning/Environment              Frequency  Min 2X/week        Progress Toward Goals  OT Goals(current goals can now be found in the care plan section)  Progress towards OT goals: Progressing toward goals  Acute Rehab OT Goals Time For Goal Achievement: 04/05/24  Plan      Co-evaluation    PT/OT/SLP Co-Evaluation/Treatment: Yes Reason for Co-Treatment: For patient/therapist safety;To address functional/ADL transfers;Complexity of the patient's impairments (multi-system involvement) PT goals  addressed during session: Mobility/safety with mobility;Strengthening/ROM OT goals addressed during session: ADL's and self-care      AM-PAC OT 6 Clicks Daily Activity     Outcome Measure   Help from another person eating meals?: A Little Help from another person taking care of personal grooming?: A Little Help from another person toileting, which includes using toliet, bedpan, or urinal?: A Lot Help from another person bathing (including washing, rinsing, drying)?: A Lot Help from another person to put on and taking off regular upper body clothing?: A Lot Help from another person to put on and taking off regular lower body clothing?: A Lot 6 Click Score: 14    End of Session Equipment Utilized During Treatment: Rolling walker (2 wheels)  OT Visit Diagnosis: Other abnormalities of gait and mobility (R26.89);Muscle weakness (generalized) (M62.81)   Activity Tolerance Patient tolerated treatment well   Patient Left in bed;with call bell/phone within reach;with bed alarm set;with family/visitor present   Nurse Communication Mobility status        Time: 9055-8991 OT Time Calculation (min): 24 min  Charges: OT General Charges $OT Visit: 1 Visit OT Treatments $Therapeutic Activity: 8-22 mins Therisa Sheffield, OTD OTR/L  03/24/24, 1:26 PM

## 2024-03-24 NOTE — Progress Notes (Signed)
 Physical Therapy Treatment Patient Details Name: Hannah Yu MRN: [id number] DOB: 17-Nov-1945 Today's Date: 03/24/2024   History of Present Illness 78 y/o female presented to ED on 03/18/24 after fall and hypoxia. Admitted for acute hypoxic respiratory failure with acute segmental PE. 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 She required +2 person assistance. Limited sitting tolerance of less than 5 minutes. Sp02 remains in the 90's with activity. She is fatigued with minimal activity. Consider rehabilitation < 3 hours/day after this hospital stay pending patient's goals. PT will continue to follow.    If plan is discharge home, recommend the following: Two people to help with walking and/or transfers;A lot of help with bathing/dressing/bathroom;Assistance with cooking/housework;Assist for transportation;Help with stairs or ramp for entrance   Can travel by private vehicle     No  Equipment Recommendations   (TBD at next level of care)    Recommendations for Other Services       Precautions / Restrictions Precautions Precautions: Fall Recall of Precautions/Restrictions: Intact Restrictions Weight Bearing Restrictions Per Provider Order: No     Mobility  Bed Mobility Overal bed mobility: Needs Assistance Bed Mobility: Supine to Sit, Sit to Supine     Supine to sit: Max assist, +2 for physical assistance Sit to supine: Max assist, +2 for physical assistance   General bed mobility comments: assistance for trunk and BLE support. cues for task initiation and sequencing. increased time and effort required    Transfers                   General transfer comment: limited sitting tolerance and fatigue with minimal activity. standing not attempted this session     Ambulation/Gait                   Stairs             Wheelchair Mobility     Tilt Bed    Modified Rankin (Stroke Patients Only)       Balance Overall balance assessment: Needs assistance Sitting-balance support: Feet supported Sitting balance-Leahy Scale: Poor Sitting balance - Comments: increased flexed posture with fatigue. cues for upright positioning for lung function and for breathing.                                    Communication Communication Communication: No apparent difficulties  Cognition Arousal: Alert Behavior During Therapy: WFL for tasks assessed/performed   PT - Cognitive impairments: No apparent impairments                         Following commands: Intact      Cueing Cueing Techniques: Verbal cues  Exercises      General Comments General comments (skin integrity, edema, etc.): Sp02 92-94% on 8 L02. HR 80-90%      Pertinent Vitals/Pain Pain Assessment Pain Assessment: Faces Faces Pain Scale: Hurts little more Pain Location: L thigh Pain Descriptors / Indicators: Discomfort, Grimacing Pain Intervention(s): Limited activity within patient's tolerance, Monitored during session, Repositioned    Home Living                          Prior Function  PT Goals (current goals can now be found in the care plan section) Acute Rehab PT Goals Patient Stated Goal: to get better to go home PT Goal Formulation: With patient/family Time For Goal Achievement: 04/06/24 Potential to Achieve Goals: Fair Progress towards PT goals: Progressing toward goals    Frequency    Min 3X/week      PT Plan      Co-evaluation PT/OT/SLP Co-Evaluation/Treatment: Yes Reason for Co-Treatment: For patient/therapist safety;To address functional/ADL transfers;Complexity of the patient's impairments (multi-system involvement) PT goals addressed during session: Mobility/safety with  mobility;Strengthening/ROM OT goals addressed during session: ADL's and self-care      AM-PAC PT 6 Clicks Mobility   Outcome Measure  Help needed turning from your back to your side while in a flat bed without using bedrails?: Total Help needed moving from lying on your back to sitting on the side of a flat bed without using bedrails?: Total Help needed moving to and from a bed to a chair (including a wheelchair)?: Total Help needed standing up from a chair using your arms (e.g., wheelchair or bedside chair)?: Total Help needed to walk in hospital room?: Total Help needed climbing 3-5 steps with a railing? : Total 6 Click Score: 6    End of Session   Activity Tolerance: Patient limited by fatigue Patient left: in bed;with call bell/phone within reach;with bed alarm set Nurse Communication: Mobility status PT Visit Diagnosis: Muscle weakness (generalized) (M62.81);Other abnormalities of gait and mobility (R26.89);Difficulty in walking, not elsewhere classified (R26.2)     Time: 9055-8991 PT Time Calculation (min) (ACUTE ONLY): 24 min  Charges:    $Therapeutic Activity: 8-22 mins PT General Charges $$ ACUTE PT VISIT: 1 Visit                     Hannah Yu, PT, MPT   Hannah Yu 03/24/2024, 11:25 AM

## 2024-03-24 NOTE — Progress Notes (Signed)
 [facility] CLINIC CARDIOLOGY PROGRESS NOTE   Patient ID: JHERI MITTER MRN: [id number] DOB/AGE: [birth year] 79 y.o.  Admit date: 03/18/2024 Referring Physician Dr. Isadora Primary Physician Epifanio Alm SQUIBB, MD  Primary Cardiologist None Reason for Consultation PE, RV dysfunction  HPI: Hannah Yu is a 78 y.o. 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  Found to have acute segmental right PE, no evidence of right heart strain on CT.  Echo did reveal RV dysfunction.  Cardiology was consulted for further evaluation.   Interval History:  - Patient seen and examined this morning, resting in hospital bed with family at bedside. - She states that she feels overall well today.  Continues to endorse improvement in shortness of breath. - She denies any chest pain or palpitation symptoms.  Heart rate stable today.  Review of systems complete and found to be negative unless listed above   Vitals:   03/24/24 0315 03/24/24 0500 03/24/24 0804 03/24/24 0825  BP: (!) 141/73  (!) 154/72   Pulse: (!) 112  71   Resp: 16  19   Temp: (!) 96.8 F (36 C)  98.4 F (36.9 C)   TempSrc:   Oral   SpO2: 100%  100% 100%  Weight:  72 kg    Height:         Intake/Output Summary (Last 24 hours) at 03/24/2024 0939 Last data filed at 03/24/2024 0315 Gross per 24 hour  Intake 921.69 ml  Output 1380 ml  Net -458.31 ml     PHYSICAL EXAM General: Chronically ill-appearing elderly female, well nourished, in no acute distress. HEENT: Normocephalic and atraumatic. Neck: No JVD.  Lungs: Normal respiratory effort on 8L [place]. Clear bilaterally to auscultation. No wheezes, crackles, rhonchi.  Heart: Irregularly  irregular, borderline controlled rate. Normal S1 and S2 without gallops or murmurs. Radial & DP pulses 2+ bilaterally. Abdomen: Non-distended appearing.  Msk: Normal strength and tone for age. Extremities: No clubbing, cyanosis or edema.   Neuro: Alert and oriented X 3. Psych: Mood appropriate, affect congruent.    LABS: Basic Metabolic Panel: Recent Labs    03/23/24 0425 03/24/24 0335  NA 127* 128*  K 4.3 4.0  CL 95* 92*  CO2 25 27  GLUCOSE 122* 120*  BUN 16 11  CREATININE 0.65 0.58  CALCIUM 8.2* 8.3*  MG 2.0 1.8  PHOS 2.3* 2.7   Liver Function Tests: No results for input(s): AST, ALT, ALKPHOS, BILITOT, PROT, ALBUMIN  in the last 72 hours.  No results for input(s): LIPASE, AMYLASE in the last 72 hours. CBC: Recent Labs    03/23/24 0425 03/24/24 0335  WBC 25.1* 19.5*  HGB 8.1* 8.7*  HCT 25.9* 27.6*  MCV 78.7* 79.5*  PLT 414* 402*   Cardiac Enzymes: No results for input(s): CKTOTAL, CKMB, CKMBINDEX, TROPONINIHS in the last 72 hours. BNP: No results for input(s): BNP in the last 72 hours. D-Dimer: No results for input(s): DDIMER in the last 72 hours. Hemoglobin A1C: No results for input(s): HGBA1C in the last 72 hours. Fasting Lipid Panel: No results for input(s): CHOL, HDL, LDLCALC, TRIG, CHOLHDL, LDLDIRECT in the last 72 hours. Thyroid  Function Tests: No results for input(s): TSH, T4TOTAL, T3FREE, THYROIDAB in the last 72 hours.  Invalid input(s):  FREET3 Anemia Panel: No results for input(s): VITAMINB12, FOLATE, FERRITIN, TIBC, IRON, RETICCTPCT in the last 72 hours.  DG Chest Port 1 View Result Date: 03/22/2024 EXAM: 1 VIEW(S) XRAY OF THE CHEST 03/22/2024 06:02:00 PM COMPARISON: 03/22/2024 CLINICAL HISTORY: Acute hypoxemic respiratory failure (HCC) [id number] FINDINGS: LINES, TUBES AND DEVICES: Stable right chest Port-A-Cath. LUNGS AND PLEURA: Improved perihilar interstitial pulmonary infiltrate.  Persistent retrocardiac consolidation in keeping with atelectasis or infiltrate within this region. Small left pleural effusion. No pleural effusion on the right. No pneumothorax. HEART AND MEDIASTINUM: Stable cardiomegaly. BONES AND SOFT TISSUES: No acute osseous abnormality. IMPRESSION: 1. Improved perihilar interstitial pulmonary infiltrate and small left pleural effusion, suggesting changes of mild cardiogenic failure. 2. Persistent retrocardiac consolidation, in keeping with atelectasis or infiltrate within this region. Electronically signed by: Dorethia Molt MD 03/22/2024 11:29 PM EST RP Workstation: [id number]     ECHO as above  TELEMETRY (personally reviewed): SR PACs rate 90s  EKG (personally reviewed): Atrial fibrillation rate 126 bpm  DATA reviewed by me 03/24/24: last 24h vitals tele labs imaging I/O, admission H&P, PCCM notes, palliative notes  Principal Problem:   Acute saddle pulmonary embolism (HCC) Active Problems:   Acute respiratory failure with hypoxia (HCC)   Acute pulmonary embolism without acute cor pulmonale (HCC)   Severe sepsis (HCC)   Mild pulmonic regurgitation and RV dysfunction by prior echocardiogram   AKI (acute kidney injury)   Palliative care encounter    ASSESSMENT AND PLAN: Hannah Yu is a 78 y.o. 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  Found to have acute segmental right PE, no evidence of right heart strain on CT.  Echo did reveal RV dysfunction.  Cardiology was consulted for further evaluation.   # Acute pulmonary emboli # Metastatic adenocarcinoma of lung # COPD # RV dysfunction # Atrial fibrillation RVR Patient presented with worsening  shortness of breath, found to have pulmonary emboli without evidence of right heart strain on CTA.  Echo this admission with EF 60 to 65%, no WMA's, grade 1 diastolic dysfunction, moderate RV enlargement with moderately reduced function, severely elevated PASP, severe right atrial dilation, severe TR. -Transition to p.o. amiodarone  load with 400 mg twice daily for 7 days followed by 200 mg daily. - Increase metoprolol  to 25 mg twice daily. - Continue IV heparin  for anticoagulation. - Would not recommend pursuing right heart catheterization at this time.  Doubtful that this would provide any additional information that would overall change course of management. - Palliative medicine has been consulted.  Patient is critically ill with acute PE in setting of underlying metastatic lung cancer now with worsening respiratory status.  Overall prognosis is guarded.  This patient's case was discussed and created with Dr. Florencio and he is in agreement.  Signed:  Danita Bloch, PA-C  03/24/2024, 9:39 AM [facility] Cardiology

## 2024-03-25 ENCOUNTER — Telehealth (HOSPITAL_COMMUNITY): Payer: Self-pay

## 2024-03-25 ENCOUNTER — Other Ambulatory Visit (HOSPITAL_COMMUNITY): Payer: Self-pay

## 2024-03-25 ENCOUNTER — Inpatient Hospital Stay

## 2024-03-25 DIAGNOSIS — I2602 Saddle embolus of pulmonary artery with acute cor pulmonale: Secondary | ICD-10-CM | POA: Diagnosis not present

## 2024-03-25 DIAGNOSIS — I2699 Other pulmonary embolism without acute cor pulmonale: Secondary | ICD-10-CM | POA: Diagnosis not present

## 2024-03-25 DIAGNOSIS — J9601 Acute respiratory failure with hypoxia: Secondary | ICD-10-CM | POA: Diagnosis not present

## 2024-03-25 DIAGNOSIS — N179 Acute kidney failure, unspecified: Secondary | ICD-10-CM | POA: Diagnosis not present

## 2024-03-25 LAB — HEPARIN LEVEL (UNFRACTIONATED): Heparin Unfractionated: 0.36 [IU]/mL (ref 0.30–0.70)

## 2024-03-25 LAB — BASIC METABOLIC PANEL WITH GFR
Anion gap: 7 (ref 5–15)
BUN: 10 mg/dL (ref 8–23)
CO2: 28 mmol/L (ref 22–32)
Calcium: 8.4 mg/dL — ABNORMAL LOW (ref 8.9–10.3)
Chloride: 93 mmol/L — ABNORMAL LOW (ref 98–111)
Creatinine, Ser: 0.53 mg/dL (ref 0.44–1.00)
GFR, Estimated: >60 mL/min (ref >60)
Glucose, Bld: 120 mg/dL — ABNORMAL HIGH (ref 70–99)
Potassium: 4.2 mmol/L (ref 3.5–5.1)
Sodium: 128 mmol/L — ABNORMAL LOW (ref 135–145)

## 2024-03-25 LAB — CBC
HCT: 25.8 % — ABNORMAL LOW (ref 36.0–46.0)
Hemoglobin: 8 g/dL — ABNORMAL LOW (ref 12.0–15.0)
MCH: 24.2 pg — ABNORMAL LOW (ref 26.0–34.0)
MCHC: 31 g/dL (ref 30.0–36.0)
MCV: 77.9 fL — ABNORMAL LOW (ref 80.0–100.0)
Platelets: 419 K/uL — ABNORMAL HIGH (ref 150–400)
RBC: 3.31 MIL/uL — ABNORMAL LOW (ref 3.87–5.11)
RDW: 23 % — ABNORMAL HIGH (ref 11.5–15.5)
WBC: 15.7 K/uL — ABNORMAL HIGH (ref 4.0–10.5)
nRBC: 0.4 % — ABNORMAL HIGH (ref 0.0–0.2)

## 2024-03-25 LAB — PHOSPHORUS: Phosphorus: 2.9 mg/dL (ref 2.5–4.6)

## 2024-03-25 MED ORDER — VITAMIN C 500 MG PO TABS
500.0000 mg | ORAL_TABLET | Freq: Two times a day (BID) | ORAL | Status: DC
  Administered 2024-03-25 – 2024-04-03 (×18): 500 mg via ORAL
  Filled 2024-03-25 (×18): qty 1

## 2024-03-25 MED ORDER — FUROSEMIDE 10 MG/ML IJ SOLN
80.0000 mg | Freq: Once | INTRAMUSCULAR | Status: AC
  Administered 2024-03-25: 80 mg via INTRAVENOUS
  Filled 2024-03-25: qty 8

## 2024-03-25 MED ORDER — APIXABAN 5 MG PO TABS
10.0000 mg | ORAL_TABLET | Freq: Two times a day (BID) | ORAL | Status: AC
Start: 2024-03-25 — End: 2024-04-01
  Administered 2024-03-25 – 2024-03-31 (×14): 10 mg via ORAL
  Filled 2024-03-25 (×14): qty 2

## 2024-03-25 MED ORDER — APIXABAN 5 MG PO TABS
5.0000 mg | ORAL_TABLET | Freq: Two times a day (BID) | ORAL | Status: DC
  Administered 2024-04-01 – 2024-04-03 (×5): 5 mg via ORAL
  Filled 2024-03-25 (×5): qty 1

## 2024-03-25 MED ORDER — ENSURE MAX PROTEIN PO LIQD
11.0000 [oz_av] | Freq: Two times a day (BID) | ORAL | Status: DC
  Administered 2024-03-25 – 2024-04-03 (×15): 11 [oz_av] via ORAL

## 2024-03-25 MED ORDER — POLYVINYL ALCOHOL 1.4 % OP SOLN
1.0000 [drp] | OPHTHALMIC | Status: DC | PRN
  Filled 2024-03-25: qty 15

## 2024-03-25 NOTE — Progress Notes (Signed)
 [facility] Cardiology    SUBJECTIVE: Patient feeling somewhat better no palpitation tachycardia no chest pain shortness of breath is improved no pain still has generalized weakness lying comfortably in bed   Vitals:   03/25/24 1131 03/25/24 1357 03/25/24 1611 03/25/24 1816  BP: 139/67 118/61 129/61 (!) 141/76  Pulse: 68 72 68 70  Resp: 19 20 18 16   Temp: 97.6 F (36.4 C) 98.3 F (36.8 C) 97.9 F (36.6 C) (!) 97.3 F (36.3 C)  TempSrc:  Oral Axillary Axillary  SpO2: 100% 100% 99% 99%  Weight:      Height:         Intake/Output Summary (Last 24 hours) at 03/25/2024 1925 Last data filed at 03/25/2024 1800 Gross per 24 hour  Intake 579.76 ml  Output 1700 ml  Net -1120.24 ml      PHYSICAL EXAM  General: Well developed, well nourished, in no acute distress HEENT:  Normocephalic and atramatic Neck:  No JVD.  Lungs: Clear bilaterally to auscultation and percussion. Heart: HRRR . Normal S1 and S2 without gallops or murmurs.  Abdomen: Bowel sounds are positive, abdomen soft and non-tender  Msk:  Back normal, normal gait. Normal strength and tone for age. Extremities: No clubbing, cyanosis or edema.   Neuro: Alert and oriented X 3. Psych:  Good affect, responds appropriately   LABS: Basic Metabolic Panel: Recent Labs    03/23/24 0425 03/24/24 0335 03/25/24 0450  NA 127* 128* 128*  K 4.3 4.0 4.2  CL 95* 92* 93*  CO2 25 27 28   GLUCOSE 122* 120* 120*  BUN 16 11 10   CREATININE 0.65 0.58 0.53  CALCIUM 8.2* 8.3* 8.4*  MG 2.0 1.8  --   PHOS 2.3* 2.7 2.9   Liver Function Tests: No results for input(s): AST, ALT, ALKPHOS, BILITOT, PROT, ALBUMIN  in the last 72 hours. No results for input(s): LIPASE, AMYLASE in the last 72 hours. CBC: Recent Labs    03/24/24 0335 03/25/24 0450  WBC 19.5* 15.7*  HGB 8.7* 8.0*  HCT 27.6* 25.8*  MCV 79.5* 77.9*  PLT 402* 419*   Cardiac Enzymes: No results for input(s): CKTOTAL, CKMB, CKMBINDEX, TROPONINI in the  last 72 hours. BNP: Invalid input(s): POCBNP D-Dimer: No results for input(s): DDIMER in the last 72 hours. Hemoglobin A1C: No results for input(s): HGBA1C in the last 72 hours. Fasting Lipid Panel: No results for input(s): CHOL, HDL, LDLCALC, TRIG, CHOLHDL, LDLDIRECT in the last 72 hours. Thyroid  Function Tests: No results for input(s): TSH, T4TOTAL, T3FREE, THYROIDAB in the last 72 hours.  Invalid input(s): FREET3 Anemia Panel: No results for input(s): VITAMINB12, FOLATE, FERRITIN, TIBC, IRON, RETICCTPCT in the last 72 hours.  No results found.   Echo as above  TELEMETRY: Sinus rhythm rate of 75:  ASSESSMENT AND PLAN:  Principal Problem:   Acute saddle pulmonary embolism (HCC) Active Problems:   Acute respiratory failure with hypoxia (HCC)   Acute pulmonary embolism without acute cor pulmonale (HCC)   Severe sepsis (HCC)   Mild pulmonic regurgitation and RV dysfunction by prior echocardiogram   AKI (acute kidney injury)   Palliative care encounter    Plan Acute PE continue anticoagulation therapy thrombolytics are contraindicated because of brain mets transition from heparin  to Eliquis  Community-acquired pneumonia continue antibiotic therapy Atrial fibrillation RVR currently on amiodarone  and Eliquis  as well as metoprolol  for rate control Metastatic adenocarcinoma being followed by oncology recommend palliative care for goals of care Chronic persistent hyponatremia improving continue medical management Generalized weakness and fatigue  multifactorial recommend physical and Occupational Therapy as tolerated Recommend conservative cardiac input at this point No invasive procedures recommended   Hannah JONETTA Lovelace, MD, 03/25/2024 7:25 PM

## 2024-03-25 NOTE — Progress Notes (Signed)
 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  TTE with RV strain currently on heparin  drip.  Course complicated by A-fib with RVR requiring amiodarone  drip. Initially required levophed  gtt which has been wean off. Cardiology following. Hospital course as below  11/19: transfer to PCU, wound care c/s 11/20: change to PO Amiodarone  11/21: Lasix  80 mg IV once, change Heparin  to Eliquis , transfer to med-surg. DC tele   Subjective:  SOB slowly improving, still has difficulty with expectoration of cough, family at bedside  Objective: Vitals:   03/25/24 1131 03/25/24 1357 03/25/24 1611 03/25/24 1816  BP: 139/67 118/61 129/61 (!) 141/76  Pulse: 68 72 68 70  Resp: 19 20 18 16   Temp: 97.6 F (36.4 C) 98.3 F (36.8 C) 97.9 F (36.6 C) (!) 97.3 F (36.3 C)  TempSrc:  Oral Axillary Axillary  SpO2: 100% 100% 99% 99%  Weight:      Height:        Intake/Output Summary (Last 24 hours) at 03/25/2024 1947 Last data filed at 03/25/2024 1800 Gross per 24 hour  Intake 579.76 ml  Output 1700 ml  Net -1120.24 ml   Filed Weights   03/23/24 0400 03/24/24 0500 03/25/24 0500  Weight: 72.2 kg 72 kg 72.2 kg    Examination: General: Acute on chronically-ill appearing female, on HFNC HENT: Supple, no JVD Lungs: Faint rhonchi throughout, even, non labored  Cardiovascular: Irregular irregular, no m/r/g, 1+ bilateral lower extremity edema   Abdomen: +BS x4, non distended, non tender  Extremities: Normal bulk and tone, moves all extremities  Neuro: Alert and oriented, follows commands GU: Indwelling foley catheter draining yellow urine   Assessment & Plan:  Acute segmental PE - US  shows nonocclusive thrombus in right posterior tibial and peroneal vein - IR consulted for acute segmental PE clot burden minimal and thrombolysis contraindicated due to brain metastasis   - change Heparin  to Eliquis  on 11/21  Community Acquired Pneumonia - RVP negative, MRSA nares negative - Blood culture 11/14 NGTD - Completed Abx for 5 days - improving leukocytosis  Acute hypoxic respiratory failure  - Suspect likely due to acute segmental PE, possible pneumonia, A fib, volume overload - Was changed from BiPAP to HFNC 9 liter -> 5 liters today - Wean oxygen as able to keep sats >88% - Enouraged use of incentive spirometry  Atrial fibrillation with RVR Cardiogenic shock, pulmonary HTN - Echo 03/19/24: EF 60 to 65%, grade I diastolic dysfunction, right ventricle size moderately enlarged, severely elevated pulmonary artery systolic pressures, severe tricuspid valve regurgitation - continue p.o. amiodarone  load with 400 mg twice daily for 7 days followed by 200 mg daily. - continue metoprolol  to 25 mg twice daily.   Metastatic adenocarcinoma  - Seen by oncology, appreciate recs - New severe T6 compression fracture - XR hip increased lucency at the acetabulum on the left, likely related to metastatic disease.  Possibility of superimposed fracture - Oncology and Palliative care following - overall  poor prognosis  Hyponatremia  - Sodium improved from 117 on presentation, slowly up to 128.   AKI - resolved - Monitor Cr  Acute Metabolic encephalopathy - nurse shared lethargy this evening. Will hold Oxycodone   Generalized Weakness - PT/OT recommends SNF. Patient declines and would like to go home with [facility] - aide as well as PT, OT, RN    DVT prophylaxis: Eliquis    Code Status: Limited: Do not attempt resuscitation (DNR) -DNR-LIMITED -Do Not Intubate/DNI  Disposition:  possible DC in next 4-5 days depending on clinical condition  Consultants:  PCCM Cardiology Onco  Antimicrobials:  Anti-infectives (From admission, onward)    Start     Dose/Rate Route Frequency Ordered Stop   03/22/24 1815  azithromycin  (ZITHROMAX ) 500 mg in sodium chloride  0.9 % 250 mL IVPB        500 mg 250 mL/hr over 60 Minutes Intravenous Every 24 hours 03/22/24 1256 03/22/24 1833   03/22/24 1600  cefTRIAXone  (ROCEPHIN ) 2 g in sodium chloride  0.9 % 100 mL IVPB        2 g 200 mL/hr over 30 Minutes Intravenous Every 24 hours 03/22/24 1449 03/24/24 1507   03/20/24 0600  vancomycin  (VANCOREADY) IVPB 750 mg/150 mL  Status:  Discontinued       Placed in Followed by Linked Group   750 mg 150 mL/hr over 60 Minutes Intravenous Every 24 hours 03/19/24 0530 03/19/24 1049   03/19/24 1815  azithromycin  (ZITHROMAX ) 500 mg in sodium chloride  0.9 % 250 mL IVPB  Status:  Discontinued        500 mg 250 mL/hr over 60 Minutes Intravenous Every 24 hours 03/19/24 1716 03/22/24 1256   03/19/24 1400  cefTRIAXone  (ROCEPHIN ) 2 g in sodium chloride  0.9 % 100 mL IVPB        2 g 200 mL/hr over 30 Minutes Intravenous Every 24 hours 03/19/24 1049 03/22/24 2359   03/19/24 0800  ceFEPIme  (MAXIPIME ) 2 g in sodium chloride  0.9 % 100 mL IVPB  Status:  Discontinued        2 g 200 mL/hr over 30 Minutes Intravenous Every 12 hours 03/19/24 0523 03/19/24 1049   03/19/24 0615  vancomycin  (VANCOREADY) IVPB 1750 mg/350 mL       Placed in Followed by Linked Group   1,750 mg 175 mL/hr over 120 Minutes Intravenous  Once 03/19/24 0530 03/19/24 0812   03/18/24 1945  cefTRIAXone  (ROCEPHIN ) 2 g in sodium chloride  0.9 % 100 mL IVPB        2 g 200 mL/hr over 30 Minutes Intravenous  Once 03/18/24 1930 03/18/24 2202   03/18/24 1945  azithromycin  (ZITHROMAX ) 500 mg in sodium chloride  0.9 %  250 mL IVPB        500 mg 250 mL/hr over 60 Minutes Intravenous  Once 03/18/24 1930 03/19/24 0001       Data Reviewed: I have personally reviewed following labs and imaging studies CBC: Recent Labs  Lab 03/21/24 0451 03/22/24 0442 03/23/24 0425 03/24/24 0335 03/25/24 0450  WBC 15.1* 16.7* 25.1* 19.5* 15.7*  HGB 8.0* 8.5* 8.1* 8.7* 8.0*  HCT 26.2* 27.4* 25.9* 27.6* 25.8*  MCV 79.6* 79.2* 78.7* 79.5* 77.9*  PLT 446* 414* 414* 402* 419*   Basic Metabolic Panel: Recent Labs  Lab 03/19/24 0326 03/20/24 0624 03/21/24 0451 03/22/24 0442 03/23/24 0425 03/24/24 0335 03/25/24 0450  NA 120* 129* 129* 127* 127* 128* 128*  K 4.6 4.2 4.4 4.3 4.3 4.0 4.2  CL 87* 92* 95* 92* 95* 92*  93*  CO2 20* 22 25 25 25 27 28   GLUCOSE 165* 103* 112* 124* 122* 120* 120*  BUN 37* 25* 17 19 16 11 10   CREATININE 1.14* 0.71 0.63 0.87 0.65 0.58 0.53  CALCIUM 7.7* 8.5* 8.5* 8.4* 8.2* 8.3* 8.4*  MG 2.0 2.1  --  2.1 2.0 1.8  --   PHOS 4.5 3.6  --  3.0 2.3* 2.7 2.9   GFR: Estimated Creatinine Clearance: 50.6 mL/min (by C-G formula based on SCr of 0.53 mg/dL). Liver Function Tests: Recent Labs  Lab 03/21/24 0451  AST 26  ALT 27  ALKPHOS 240*  BILITOT 0.2  PROT 5.8*  ALBUMIN  2.7*   CBG: Recent Labs  Lab 03/19/24 0015  GLUCAP 173*    Recent Results (from the past 240 hours)  Blood Culture (routine x 2)     Status: None   Collection Time: 03/18/24  6:00 PM   Specimen: BLOOD  Result Value Ref Range Status   Specimen Description BLOOD RIGHT ANTECUBITAL  Final   Special Requests   Final    BOTTLES DRAWN AEROBIC AND ANAEROBIC Blood Culture results may not be optimal due to an inadequate volume of blood received in culture bottles   Culture   Final    NO GROWTH 5 DAYS Performed at [facility], [address] [place], [place] [address]    Report Status 03/23/2024 FINAL  Final  Blood Culture (routine x 2)     Status: None   Collection Time: 03/18/24  6:05 PM   Specimen: BLOOD   Result Value Ref Range Status   Specimen Description BLOOD BLOOD LEFT HAND  Final   Special Requests   Final    BOTTLES DRAWN AEROBIC AND ANAEROBIC Blood Culture results may not be optimal due to an inadequate volume of blood received in culture bottles   Culture   Final    NO GROWTH 5 DAYS Performed at [facility], [address] [place], [place] [address]    Report Status 03/23/2024 FINAL  Final  MRSA Next Gen by PCR, Nasal     Status: None   Collection Time: 03/19/24  5:04 AM   Specimen: Anterior Nasal Swab  Result Value Ref Range Status   MRSA by PCR Next Gen NOT DETECTED NOT DETECTED Final    Comment: (NOTE) The GeneXpert MRSA Assay (FDA approved for NASAL specimens only), is one component of a comprehensive MRSA colonization surveillance program. It is not intended to diagnose MRSA infection nor to guide or monitor treatment for MRSA infections. Test performance is not FDA approved in patients less than 33 years old. Performed at [facility], [address] [place], [place] [address]   Resp panel by RT-PCR (RSV, Flu A&B, Covid) Anterior Nasal Swab     Status: None   Collection Time: 03/19/24  5:04 AM   Specimen: Anterior Nasal Swab  Result Value Ref Range Status   SARS Coronavirus 2 by RT PCR NEGATIVE NEGATIVE Final    Comment: (NOTE) SARS-CoV-2 target nucleic acids are NOT DETECTED.  The SARS-CoV-2 RNA is generally detectable in upper respiratory specimens during the acute phase of infection. The lowest concentration of SARS-CoV-2 viral copies this assay can detect is 138 copies/mL. A negative result does not preclude SARS-Cov-2 infection and should not be used as the sole basis for treatment or other patient management decisions. A negative result may occur with  improper specimen collection/handling, submission of specimen other than nasopharyngeal swab, presence of viral mutation(s)  within the areas targeted by this assay, and inadequate  number of viral copies(<138 copies/mL). A negative result must be combined with clinical observations, patient history, and epidemiological information. The expected result is Negative.  Fact Sheet for Patients:  bloggercourse.com  Fact Sheet for Healthcare Providers:  seriousbroker.it  This test is no t yet approved or cleared by the [place]  FDA and  has been authorized for detection and/or diagnosis of SARS-CoV-2 by FDA under an Emergency Use Authorization (EUA). This EUA will remain  in effect (meaning this test can be used) for the duration of the COVID-19 declaration under Section 564(b)(1) of the Act, 21 U.S.C.section 360bbb-3(b)(1), unless the authorization is terminated  or revoked sooner.       Influenza A by PCR NEGATIVE NEGATIVE Final   Influenza B by PCR NEGATIVE NEGATIVE Final    Comment: (NOTE) The Xpert Xpress SARS-CoV-2/FLU/RSV plus assay is intended as an aid in the diagnosis of influenza from Nasopharyngeal swab specimens and should not be used as a sole basis for treatment. Nasal washings and aspirates are unacceptable for Xpert Xpress SARS-CoV-2/FLU/RSV testing.  Fact Sheet for Patients: bloggercourse.com  Fact Sheet for Healthcare Providers: seriousbroker.it  This test is not yet approved or cleared by the [place]  FDA and has been authorized for detection and/or diagnosis of SARS-CoV-2 by FDA under an Emergency Use Authorization (EUA). This EUA will remain in effect (meaning this test can be used) for the duration of the COVID-19 declaration under Section 564(b)(1) of the Act, 21 U.S.C. section 360bbb-3(b)(1), unless the authorization is terminated or revoked.     Resp Syncytial Virus by PCR NEGATIVE NEGATIVE Final    Comment: (NOTE) Fact Sheet for Patients: bloggercourse.com  Fact Sheet for Healthcare  Providers: seriousbroker.it  This test is not yet approved or cleared by the [place]  FDA and has been authorized for detection and/or diagnosis of SARS-CoV-2 by FDA under an Emergency Use Authorization (EUA). This EUA will remain in effect (meaning this test can be used) for the duration of the COVID-19 declaration under Section 564(b)(1) of the Act, 21 U.S.C. section 360bbb-3(b)(1), unless the authorization is terminated or revoked.  Performed at [facility], [address] [place], [place] [address]   Respiratory (~20 pathogens) panel by PCR     Status: None   Collection Time: 03/21/24  9:37 AM   Specimen: Nasopharyngeal Swab; Respiratory  Result Value Ref Range Status   Adenovirus NOT DETECTED NOT DETECTED Final   Coronavirus 229E NOT DETECTED NOT DETECTED Final    Comment: (NOTE) The Coronavirus on the Respiratory Panel, DOES NOT test for the novel  Coronavirus (2019 nCoV)    Coronavirus HKU1 NOT DETECTED NOT DETECTED Final   Coronavirus NL63 NOT DETECTED NOT DETECTED Final   Coronavirus OC43 NOT DETECTED NOT DETECTED Final   Metapneumovirus NOT DETECTED NOT DETECTED Final   Rhinovirus / Enterovirus NOT DETECTED NOT DETECTED Final   Influenza A NOT DETECTED NOT DETECTED Final   Influenza B NOT DETECTED NOT DETECTED Final   Parainfluenza Virus 1 NOT DETECTED NOT DETECTED Final   Parainfluenza Virus 2 NOT DETECTED NOT DETECTED Final   Parainfluenza Virus 3 NOT DETECTED NOT DETECTED Final   Parainfluenza Virus 4 NOT DETECTED NOT DETECTED Final   Respiratory Syncytial Virus NOT DETECTED NOT DETECTED Final   Bordetella pertussis NOT DETECTED NOT DETECTED Final   Bordetella Parapertussis NOT DETECTED NOT DETECTED Final   Chlamydophila pneumoniae NOT DETECTED NOT DETECTED Final   Mycoplasma pneumoniae NOT  DETECTED NOT DETECTED Final    Comment: Performed at [facility], [address] [place], [place] [address]     Radiology  Studies: No results found.   Scheduled Meds:  albuterol   2.5 mg Nebulization BID   ALPRAZolam   0.5 mg Oral QHS   amiodarone   400 mg Oral BID   Followed by   NOREEN ON 03/31/2024] amiodarone   200 mg Oral Daily   apixaban   10 mg Oral BID   Followed by   NOREEN ON 04/01/2024] apixaban   5 mg Oral BID   vitamin C   500 mg Oral BID   Chlorhexidine  Gluconate Cloth  6 each Topical Daily   dextromethorphan -guaiFENesin   1 tablet Oral BID   DULoxetine   30 mg Oral Daily   magic mouthwash w/lidocaine   5 mL Oral QID   metoprolol  tartrate  25 mg Oral BID   multivitamin with minerals  1 tablet Oral Daily   Ensure Max Protein  11 oz Oral BID   sodium chloride  HYPERTONIC  4 mL Nebulization BID   thiamine   100 mg Oral Daily   Continuous Infusions:   Time spent: 35 mins   LOS: 7 days  MDM: Patient is high risk for one or more organ failure.  They necessitate ongoing hospitalization for continued IV therapies and subsequent lab monitoring. Total time spent interpreting labs and vitals, reviewing the medical record, coordinating care amongst consultants and care team members, directly assessing and discussing care with the patient and/or family: 55 min Rembert Browe Maree, MD [facility]  To contact the attending physician between 7A-7P please use Epic Chat. To contact the covering physician during after hours 7P-7A, please review Amion.  03/25/2024, 7:47 PM   *This document has been created with the assistance of dictation software. Please excuse typographical errors. *

## 2024-03-25 NOTE — Progress Notes (Signed)
 Initial Nutrition Assessment  DOCUMENTATION CODES:   Obesity unspecified  INTERVENTION:   Ensure Max protein supplement po BID, each supplement provides 150kcal and 30g of protein.  Magic cup TID with meals, each supplement provides 290 kcal and 9 grams of protein  MVI po daily   Thiamine  100mg  po daily x 7 days   Vitamin C  500mg  po BID   Pt at high refeed risk; recommend monitor potassium, magnesium  and phosphorus labs daily until stable  Daily weights   Check iron/anemia levels   NUTRITION DIAGNOSIS:   Inadequate oral intake related to acute illness as evidenced by per patient/family report.  GOAL:   Patient will meet greater than or equal to 90% of their needs  MONITOR:   PO intake, Supplement acceptance, Labs, Weight trends, I & O's, Skin  REASON FOR ASSESSMENT:   Malnutrition Screening Tool    ASSESSMENT:   78 y/o female with h/o HTN, HLD, SVT, metastatic lung cancer on chemotherapy who is admitted with acute segmental PE, CAP, AKI and Afib with RVR.  Met with pt and family in room today. Pt reports decreased appetite and oral intake for several months r/t chemotherapy but reports poor oral intake for the past two weeks. Pt reports that her mouth and gums have been sore. Pt has been drinking Boost/Ensure at home. Per chart, pt is down 23lbs(13%) over the past 5 months; this is significant weight loss. Pt reports eating 50% of her full liquid diet for lunch today. Pt has now been advanced to a regular diet. Pt documented to be eating anywhere from sips/bites to 50% of meals. RD discussed with pt the importance of adequate nutrition needed to preserve lean muscle. Pt is agreeable to supplements. Pt is at high refeed risk. Pt reports worsening neuropathy; RD will check anemia labs.    Medications reviewed and include: MVI, thiamine    Labs reviewed: Na 128(L), K 4.2 wnl, P 2.9 wnl  Wbc- 15.7(H), Hgb 8.0(L), Hct 25.8(L), MCV 77.9(L), MCH 24.2(L)  NUTRITION -  FOCUSED PHYSICAL EXAM:  Flowsheet Row Most Recent Value  Orbital Region No depletion  Upper Arm Region No depletion  Thoracic and Lumbar Region No depletion  Buccal Region No depletion  Temple Region No depletion  Clavicle Bone Region Mild depletion  Clavicle and Acromion Bone Region Mild depletion  Scapular Bone Region No depletion  Dorsal Hand Mild depletion  Patellar Region No depletion  Anterior Thigh Region No depletion  Posterior Calf Region No depletion  Edema (RD Assessment) None  Hair Reviewed  Eyes Reviewed  Mouth Reviewed  Skin Reviewed  Nails Reviewed   Diet Order:   Diet Order             Diet regular Room service appropriate? Yes; Fluid consistency: Thin  Diet effective now                  EDUCATION NEEDS:   Education needs have been addressed  Skin:  Skin Assessment: Reviewed RN Assessment (DTI buttocks)  Last BM:  11/19- TYPE 2  Height:   Ht Readings from Last 1 Encounters:  03/18/24 5' (1.524 m)    Weight:   Wt Readings from Last 1 Encounters:  03/25/24 72.2 kg    Ideal Body Weight:  45.45 kg  BMI:  Body mass index is 31.09 kg/m.  Estimated Nutritional Needs:   Kcal:  1700-1900kcal/day  Protein:  85-95g/day  Fluid:  1.4-1.6L/day  Augustin Shams MS, RD, LDN If unable to be reached, please  send secure chat to RD inpatient available from 8:00a-4:00p daily

## 2024-03-25 NOTE — Plan of Care (Signed)
   Problem: Health Behavior/Discharge Planning: Goal: Ability to manage health-related needs will improve Outcome: Progressing

## 2024-03-25 NOTE — Progress Notes (Signed)
 This RN entered pt's room to round on pt. Pt received PRN 5mg  oxycodone  for pain at 1437 today. Tolerated medication well and stated she felt better, however is now very drowsy and difficult to stay awake. Pt falling asleep mid conversation, requiring sternal stimulation to wake up and talk with staff.  MD Maree notified, VS obtained and stable. Pt beginning to wake up more, but remains drowsy and says she is tired. PRN Oxycodone  stopped by MD. Directed to monitor per MD Maree. Charge RN made aware, this RN to communicate to oncoming shift. Telemetry and continuous pulse ox remain on and functioning.

## 2024-03-25 NOTE — Plan of Care (Signed)

## 2024-03-25 NOTE — Telephone Encounter (Addendum)
 Pharmacy Patient Advocate Encounter  Insurance verification completed.    The patient is insured through [employer]. Patient has Medicare and is not eligible for a copay card, but may be able to apply for patient assistance or Medicare RX Payment Plan (Patient Must reach out to their plan, if eligible for payment plan), if available.    Ran test claim for Eliquis 5mg  and the current 30 day co-pay is $0.   This test claim was processed through [employer]- copay amounts may vary at other pharmacies due to [employer], or as the patient moves through the different stages of their insurance plan.

## 2024-03-25 NOTE — Progress Notes (Signed)
 Discussed patient's follow-up care for her malignancy with emergency contact Candis.  She expressed understanding that chemotherapy is currently on hold given patient's multiple medical problems and decreased performance status.  Once patient is discharged to either home or rehab, she will need to gain strength and have an improved performance status prior to reinitiating treatment.  Will coordinate a follow-up visit in the cancer center after Thanksgiving, not for treatment, but for evaluation and discussion on whether reinitiating chemotherapy is possible.

## 2024-03-25 NOTE — Progress Notes (Signed)
 Approximately 1400--Pt arrived to room 228. Pt oriented to room and unit. Telemetry connected and notified. Full assessment completed by this RN. Fall precautions in place.

## 2024-03-25 NOTE — Progress Notes (Signed)
 ANTICOAGULATION CONSULT NOTE  Pharmacy Consult for heparin  infusion Indication: NSTEMI and new onset Afib RVR with PE  Allergies  Allergen Reactions   Ezetimibe Other (See Comments)    Body aches, and stiffness   Codeine Diarrhea   Cortisone Swelling    injections   Lovastatin Other (See Comments)    Muscle cramps    Patient Measurements: Height: 5' (152.4 cm) Weight: 72 kg (158 lb 11.7 oz) IBW/kg (Calculated) : 45.5 HEPARIN  DW (KG): 62.2  Vital Signs: Temp: 97.2 F (36.2 C) (11/21 0520) BP: 134/62 (11/21 0520) Pulse Rate: 67 (11/21 0520)  Labs: Recent Labs    03/23/24 0425 03/24/24 0335 03/24/24 1200 03/25/24 0450  HGB 8.1* 8.7*  --  8.0*  HCT 25.9* 27.6*  --  25.8*  PLT 414* 402*  --  419*  HEPARINUNFRC 0.43 0.29* 0.40 0.36  CREATININE 0.65 0.58  --  0.53    Estimated Creatinine Clearance: 50.5 mL/min (by C-G formula based on SCr of 0.53 mg/dL).   Medical History: Past Medical History:  Diagnosis Date   Acid reflux    Anemia    Anesthesia complication    Tachycardia previously, now on metoprolol    Arthritis    09/02/2019: per patient have it real bad in both hands and back   Difficult intubation    had to terminate intubation unable to advance tube for cholecystectomy 2017?   DVT (deep venous thrombosis) (HCC)    leg   Hypertension    Sciatica    09/02/2019: has had it for about 3 years    Assessment: Pt is a 78 yo female presenting to ED c/o SOB and fall, found with new onset Afib , found with elevated Troponin level trending up and BNP.  Goal of Therapy:  Heparin  level 0.3-0.7 units/ml Monitor platelets by anticoagulation protocol: Yes  11/15 0326 HL 0.30, therapeutic x 1 11/15 1123 HL 0.25, subtherapeutic@950  units/hr 11/15 2007 HL 0.26, subtherapeutic @ 1100 units/hr 11/16 0624 HL 0.29, subtherapeutic 11/16 1509 HL 0.32, therapeutic x 1 11/17 0001 HL 0.30, therapeutic x 2 11/17 0451 HL 0.28, subtherapeutic 11/17 1519 HL 0.37,  therapeutic x 1 11/17 2345 HL 0.57, therapeutic X 2  11/18 0442 HL 0.62, therapeutic X 3 11/19 0425 HL 0.43, therapeutic X 4  11/20 0335 HL 0.29, SUBtherapeutic 11/20 1200 HL 0.4  11/21 0450 HL 0.36, therapeutic X 2    Plan:  11/21: HL @ 0450 = 0.36, therapeutic X 2 - Will continue pt on current rate and recheck HL on 11/22 with AM labs. - CBC daily   Charise Leinbach D, PharmD 03/25/2024 6:04 AM

## 2024-03-25 NOTE — Plan of Care (Signed)
 Patient tranferring to meds surg - was given 121B but is refusing shared room.  Bed placement being informed and to please find another option.

## 2024-03-26 ENCOUNTER — Inpatient Hospital Stay

## 2024-03-26 DIAGNOSIS — N179 Acute kidney failure, unspecified: Secondary | ICD-10-CM | POA: Diagnosis not present

## 2024-03-26 DIAGNOSIS — I2602 Saddle embolus of pulmonary artery with acute cor pulmonale: Secondary | ICD-10-CM | POA: Diagnosis not present

## 2024-03-26 DIAGNOSIS — J9601 Acute respiratory failure with hypoxia: Secondary | ICD-10-CM | POA: Diagnosis not present

## 2024-03-26 DIAGNOSIS — I2692 Saddle embolus of pulmonary artery without acute cor pulmonale: Secondary | ICD-10-CM | POA: Diagnosis not present

## 2024-03-26 DIAGNOSIS — I2699 Other pulmonary embolism without acute cor pulmonale: Secondary | ICD-10-CM | POA: Diagnosis not present

## 2024-03-26 LAB — BASIC METABOLIC PANEL WITH GFR
Anion gap: 7 (ref 5–15)
BUN: 15 mg/dL (ref 8–23)
CO2: 28 mmol/L (ref 22–32)
Calcium: 8.6 mg/dL — ABNORMAL LOW (ref 8.9–10.3)
Chloride: 91 mmol/L — ABNORMAL LOW (ref 98–111)
Creatinine, Ser: 0.54 mg/dL (ref 0.44–1.00)
GFR, Estimated: >60 mL/min (ref >60)
Glucose, Bld: 110 mg/dL — ABNORMAL HIGH (ref 70–99)
Potassium: 4.1 mmol/L (ref 3.5–5.1)
Sodium: 126 mmol/L — ABNORMAL LOW (ref 135–145)

## 2024-03-26 LAB — FERRITIN: Ferritin: 1442 ng/mL — ABNORMAL HIGH (ref 11–307)

## 2024-03-26 LAB — CBC
HCT: 27.2 % — ABNORMAL LOW (ref 36.0–46.0)
Hemoglobin: 8.2 g/dL — ABNORMAL LOW (ref 12.0–15.0)
MCH: 23.9 pg — ABNORMAL LOW (ref 26.0–34.0)
MCHC: 30.1 g/dL (ref 30.0–36.0)
MCV: 79.3 fL — ABNORMAL LOW (ref 80.0–100.0)
Platelets: 435 K/uL — ABNORMAL HIGH (ref 150–400)
RBC: 3.43 MIL/uL — ABNORMAL LOW (ref 3.87–5.11)
RDW: 22.8 % — ABNORMAL HIGH (ref 11.5–15.5)
WBC: 13.7 K/uL — ABNORMAL HIGH (ref 4.0–10.5)
nRBC: 0.1 % (ref 0.0–0.2)

## 2024-03-26 LAB — IRON AND TIBC
Iron: 30 ug/dL (ref 28–170)
Saturation Ratios: 13 % (ref 10.4–31.8)
TIBC: 228 ug/dL — ABNORMAL LOW (ref 250–450)
UIBC: 198 ug/dL

## 2024-03-26 LAB — FOLATE: Folate: 12.3 ng/mL (ref >5.9)

## 2024-03-26 LAB — VITAMIN B12: Vitamin B-12: >4000 pg/mL — ABNORMAL HIGH (ref 180–914)

## 2024-03-26 LAB — PHOSPHORUS: Phosphorus: 3.1 mg/dL (ref 2.5–4.6)

## 2024-03-26 MED ORDER — CARMEX CLASSIC LIP BALM EX OINT
1.0000 | TOPICAL_OINTMENT | CUTANEOUS | Status: DC | PRN
  Administered 2024-03-26: 1 via TOPICAL
  Filled 2024-03-26: qty 0.1

## 2024-03-26 NOTE — Plan of Care (Signed)
   Problem: Health Behavior/Discharge Planning: Goal: Ability to manage health-related needs will improve Outcome: Progressing

## 2024-03-26 NOTE — Progress Notes (Signed)
 SUBJECTIVE: Patient is feeling much better and is less short of breath.   Vitals:   03/25/24 2053 03/26/24 0329 03/26/24 0500 03/26/24 0839  BP:  (!) 138/59  (!) 152/66  Pulse:  65  76  Resp:  18  18  Temp:  (!) 97.4 F (36.3 C)  97.6 F (36.4 C)  TempSrc:  Oral    SpO2: 99% 98%  99%  Weight:   (P) 81 kg   Height:        Intake/Output Summary (Last 24 hours) at 03/26/2024 1340 Last data filed at 03/26/2024 0331 Gross per 24 hour  Intake 360 ml  Output 2450 ml  Net -2090 ml    LABS: Basic Metabolic Panel: Recent Labs    03/24/24 0335 03/25/24 0450 03/26/24 0755  NA 128* 128* 126*  K 4.0 4.2 4.1  CL 92* 93* 91*  CO2 27 28 28   GLUCOSE 120* 120* 110*  BUN 11 10 15   CREATININE 0.58 0.53 0.54  CALCIUM 8.3* 8.4* 8.6*  MG 1.8  --   --   PHOS 2.7 2.9 3.1   Liver Function Tests: No results for input(s): AST, ALT, ALKPHOS, BILITOT, PROT, ALBUMIN  in the last 72 hours. No results for input(s): LIPASE, AMYLASE in the last 72 hours. CBC: Recent Labs    03/25/24 0450 03/26/24 0755  WBC 15.7* 13.7*  HGB 8.0* 8.2*  HCT 25.8* 27.2*  MCV 77.9* 79.3*  PLT 419* 435*   Cardiac Enzymes: No results for input(s): CKTOTAL, CKMB, CKMBINDEX, TROPONINI in the last 72 hours. BNP: Invalid input(s): POCBNP D-Dimer: No results for input(s): DDIMER in the last 72 hours. Hemoglobin A1C: No results for input(s): HGBA1C in the last 72 hours. Fasting Lipid Panel: No results for input(s): CHOL, HDL, LDLCALC, TRIG, CHOLHDL, LDLDIRECT in the last 72 hours. Thyroid  Function Tests: No results for input(s): TSH, T4TOTAL, T3FREE, THYROIDAB in the last 72 hours.  Invalid input(s): FREET3 Anemia Panel: Recent Labs    03/26/24 0755  FOLATE 12.3  FERRITIN 1,442*  TIBC 228*  IRON 30     PHYSICAL EXAM General: Well developed, well nourished, in no acute distress HEENT:  Normocephalic and atramatic Neck:  No JVD.  Lungs: Clear  bilaterally to auscultation and percussion. Heart: HRRR . Normal S1 and S2 without gallops or murmurs.  Abdomen: Bowel sounds are positive, abdomen soft and non-tender  Msk:  Back normal, normal gait. Normal strength and tone for age. Extremities: No clubbing, cyanosis or edema.   Neuro: Alert and oriented X 3. Psych:  Good affect, responds appropriately  TELEMETRY: Sinus rhythm  ASSESSMENT AND PLAN: cute PE continue anticoagulation therapy thrombolytics are contraindicated because of brain mets transition from heparin  to Eliquis  Community-acquired pneumonia continue antibiotic therapy Atrial fibrillation RVR currently on amiodarone  and Eliquis  as well as metoprolol  for rate control Metastatic adenocarcinoma being followed by oncology recommend palliative care for goals of care Chronic persistent hyponatremia improving continue medical management Generalized weakness and fatigue multifactorial recommend physical and Occupational Therapy as tolerated Recommend conservative cardiac input at this point No invasive procedures recommended     ICD-10-CM   1. Acute respiratory failure with hypoxia (HCC)  J96.01     2. Acute pulmonary embolism without acute cor pulmonale, unspecified pulmonary embolism type (HCC)  I26.99     3. Severe sepsis (HCC)  A41.9    R65.20       Principal Problem:   Acute saddle pulmonary embolism (HCC) Active Problems:   Acute respiratory failure with  hypoxia (HCC)   Acute pulmonary embolism without acute cor pulmonale (HCC)   Severe sepsis (HCC)   Mild pulmonic regurgitation and RV dysfunction by prior echocardiogram   AKI (acute kidney injury)   Palliative care encounter    Denyse Bathe, MD, FACC 03/26/2024 1:40 PM

## 2024-03-26 NOTE — Progress Notes (Signed)
 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  TTE with RV strain currently on heparin  drip.  Course complicated by A-fib with RVR requiring amiodarone  drip. Initially required levophed  gtt which has been wean off. Cardiology following. Hospital course as below  11/19: transfer to PCU, wound care c/s 11/20: change to PO Amiodarone  11/21: Lasix  80 mg IV once, change Heparin  to Eliquis , transfer to med-surg. DC tele 11/22: Anemia panel, chest x-ray.  Continue to wean oxygen.  Discontinue Foley   Subjective:  Slept well, feeling much better, less coughing  Objective: Vitals:   03/26/24 0500 03/26/24 0839 03/26/24 1503 03/26/24 1504  BP:  (!) 152/66 (!) 121/51 128/63  Pulse:  76 71 70  Resp:  18 17   Temp:  97.6 F (36.4 C) 98.1 F (36.7 C)   TempSrc:      SpO2:  99% 99% 99%  Weight: (P) 81 kg     Height:        Intake/Output Summary (Last 24 hours) at 03/26/2024 1546 Last data filed at 03/26/2024 1426 Gross per 24 hour  Intake 420 ml  Output 1050 ml  Net -630 ml   Filed Weights   03/24/24 0500 03/25/24 0500 03/26/24 0500  Weight: 72 kg 72.2 kg (P) 81 kg    Examination: General: chronically-ill appearing female HENT: Supple, no JVD Lungs: Clear to auscultation bilaterally Cardiovascular: Irregular irregular, no m/r/g, trace bilateral lower extremity edema  Abdomen: +BS x4, non distended, non tender  Extremities:  Normal bulk and tone, moves all extremities  Neuro: Alert and oriented, follows commands GU: Indwelling foley catheter draining dark yellow urine   Assessment & Plan:  Acute segmental PE - US  shows nonocclusive thrombus in right posterior tibial and peroneal vein - IR consulted for acute segmental PE clot burden minimal and thrombolysis contraindicated due to brain metastasis   - Continue Eliquis .  Started on 11/21  Community Acquired Pneumonia - RVP negative, MRSA nares negative - Blood culture 11/14 NGTD - Completed Abx for 5 days - improving leukocytosis - Chest x-ray this morning shows increasing effusion on the left.  Will consider another dose of Lasix  tomorrow if need  Acute hypoxic respiratory failure  - Suspect likely due to acute segmental PE, possible pneumonia, A fib, volume overload - Was changed from BiPAP to HFNC 9 liter -> 5 liters today - Wean oxygen as able to keep sats >88% - Enouraged use of incentive spirometry  Atrial fibrillation with RVR Cardiogenic shock, pulmonary HTN - Echo 03/19/24: EF 60 to 65%, grade I diastolic dysfunction, right ventricle size moderately enlarged, severely elevated pulmonary artery systolic pressures, severe tricuspid valve regurgitation - continue p.o. amiodarone  load with 400 mg twice daily for 7 days followed by 200 mg daily. - continue metoprolol  to 25 mg twice daily.   Metastatic adenocarcinoma  - Seen by oncology, appreciate recs - New severe T6 compression fracture - XR hip increased lucency at the acetabulum on the  left, likely related to metastatic disease.  Possibility of superimposed fracture - Oncology and Palliative care following - overall poor prognosis.  She is DNR  Hyponatremia  - Sodium improved from 117 on presentation, slowly up to 126   AKI - resolved - Monitor Cr  Acute Metabolic encephalopathy - Resolved now.  Avoid narcotics  Generalized Weakness - PT/OT recommends SNF. Patient declines and would  like to go home with [facility] - aide as well as PT, OT, RN   DVT prophylaxis: Eliquis    Code Status: Limited: Do not attempt resuscitation (DNR) -DNR-LIMITED -Do Not Intubate/DNI  Disposition:  possible DC in next 2-3 days depending on clinical condition  Consultants:  PCCM Cardiology Onco  Antimicrobials:  Anti-infectives (From admission, onward)    Start     Dose/Rate Route Frequency Ordered Stop   03/22/24 1815  azithromycin  (ZITHROMAX ) 500 mg in sodium chloride  0.9 % 250 mL IVPB        500 mg 250 mL/hr over 60 Minutes Intravenous Every 24 hours 03/22/24 1256 03/22/24 1833   03/22/24 1600  cefTRIAXone  (ROCEPHIN ) 2 g in sodium chloride  0.9 % 100 mL IVPB        2 g 200 mL/hr over 30 Minutes Intravenous Every 24 hours 03/22/24 1449 03/24/24 1507   03/20/24 0600  vancomycin  (VANCOREADY) IVPB 750 mg/150 mL  Status:  Discontinued       Placed in Followed by Linked Group   750 mg 150 mL/hr over 60 Minutes Intravenous Every 24 hours 03/19/24 0530 03/19/24 1049   03/19/24 1815  azithromycin  (ZITHROMAX ) 500 mg in sodium chloride  0.9 % 250 mL IVPB  Status:  Discontinued        500 mg 250 mL/hr over 60 Minutes Intravenous Every 24 hours 03/19/24 1716 03/22/24 1256   03/19/24 1400  cefTRIAXone  (ROCEPHIN ) 2 g in sodium chloride  0.9 % 100 mL IVPB        2 g 200 mL/hr over 30 Minutes Intravenous Every 24 hours 03/19/24 1049 03/22/24 2359   03/19/24 0800  ceFEPIme  (MAXIPIME ) 2 g in sodium chloride  0.9 % 100 mL IVPB  Status:  Discontinued        2 g 200 mL/hr over 30 Minutes Intravenous Every 12 hours 03/19/24 0523 03/19/24 1049   03/19/24 0615  vancomycin  (VANCOREADY) IVPB 1750 mg/350 mL       Placed in Followed by Linked Group   1,750 mg 175 mL/hr over 120 Minutes Intravenous  Once 03/19/24 0530 03/19/24 0812   03/18/24 1945  cefTRIAXone  (ROCEPHIN ) 2 g in sodium chloride  0.9 % 100 mL IVPB        2 g 200 mL/hr over 30 Minutes Intravenous  Once 03/18/24 1930 03/18/24 2202   03/18/24 1945   azithromycin  (ZITHROMAX ) 500 mg in sodium chloride  0.9 % 250 mL IVPB        500 mg 250 mL/hr over 60 Minutes Intravenous  Once 03/18/24 1930 03/19/24 0001       Data Reviewed: I have personally reviewed following labs and imaging studies CBC: Recent Labs  Lab 03/22/24 0442 03/23/24 0425 03/24/24 0335 03/25/24 0450 03/26/24 0755  WBC 16.7* 25.1* 19.5* 15.7* 13.7*  HGB 8.5* 8.1* 8.7* 8.0* 8.2*  HCT 27.4* 25.9* 27.6* 25.8* 27.2*  MCV 79.2* 78.7* 79.5* 77.9* 79.3*  PLT 414* 414* 402* 419* 435*   Basic Metabolic Panel: Recent Labs  Lab 03/20/24 0624 03/21/24 0451 03/22/24 0442 03/23/24 0425 03/24/24 0335 03/25/24 0450 03/26/24 0755  NA 129*   < >  127* 127* 128* 128* 126*  K 4.2   < > 4.3 4.3 4.0 4.2 4.1  CL 92*   < > 92* 95* 92* 93* 91*  CO2 22   < > 25 25 27 28 28   GLUCOSE 103*   < > 124* 122* 120* 120* 110*  BUN 25*   < > 19 16 11 10 15   CREATININE 0.71   < > 0.87 0.65 0.58 0.53 0.54  CALCIUM 8.5*   < > 8.4* 8.2* 8.3* 8.4* 8.6*  MG 2.1  --  2.1 2.0 1.8  --   --   PHOS 3.6  --  3.0 2.3* 2.7 2.9 3.1   < > = values in this interval not displayed.   GFR: Estimated Creatinine Clearance: 50.6 mL/min (by C-G formula based on SCr of 0.54 mg/dL). Liver Function Tests: Recent Labs  Lab 03/21/24 0451  AST 26  ALT 27  ALKPHOS 240*  BILITOT 0.2  PROT 5.8*  ALBUMIN  2.7*   CBG: No results for input(s): GLUCAP in the last 168 hours.   Recent Results (from the past 240 hours)  Blood Culture (routine x 2)     Status: None   Collection Time: 03/18/24  6:00 PM   Specimen: BLOOD  Result Value Ref Range Status   Specimen Description BLOOD RIGHT ANTECUBITAL  Final   Special Requests   Final    BOTTLES DRAWN AEROBIC AND ANAEROBIC Blood Culture results may not be optimal due to an inadequate volume of blood received in culture bottles   Culture   Final    NO GROWTH 5 DAYS Performed at [facility], [address] [place], [place] [address]    Report Status  03/23/2024 FINAL  Final  Blood Culture (routine x 2)     Status: None   Collection Time: 03/18/24  6:05 PM   Specimen: BLOOD  Result Value Ref Range Status   Specimen Description BLOOD BLOOD LEFT HAND  Final   Special Requests   Final    BOTTLES DRAWN AEROBIC AND ANAEROBIC Blood Culture results may not be optimal due to an inadequate volume of blood received in culture bottles   Culture   Final    NO GROWTH 5 DAYS Performed at [facility], [address] [place], [place] [address]    Report Status 03/23/2024 FINAL  Final  MRSA Next Gen by PCR, Nasal     Status: None   Collection Time: 03/19/24  5:04 AM   Specimen: Anterior Nasal Swab  Result Value Ref Range Status   MRSA by PCR Next Gen NOT DETECTED NOT DETECTED Final    Comment: (NOTE) The GeneXpert MRSA Assay (FDA approved for NASAL specimens only), is one component of a comprehensive MRSA colonization surveillance program. It is not intended to diagnose MRSA infection nor to guide or monitor treatment for MRSA infections. Test performance is not FDA approved in patients less than 85 years old. Performed at [facility], [address] [place], [place] [address]   Resp panel by RT-PCR (RSV, Flu A&B, Covid) Anterior Nasal Swab     Status: None   Collection Time: 03/19/24  5:04 AM   Specimen: Anterior Nasal Swab  Result Value Ref Range Status   SARS Coronavirus 2 by RT PCR NEGATIVE NEGATIVE Final    Comment: (NOTE) SARS-CoV-2 target nucleic acids are NOT DETECTED.  The SARS-CoV-2 RNA is generally detectable in upper respiratory specimens during the acute phase of infection. The lowest concentration  of SARS-CoV-2 viral copies this assay can detect is 138 copies/mL. A negative result does not preclude SARS-Cov-2 infection and should not be used as the sole basis for treatment or other patient management decisions. A negative result may occur with  improper specimen collection/handling, submission of  specimen other than nasopharyngeal swab, presence of viral mutation(s) within the areas targeted by this assay, and inadequate number of viral copies(<138 copies/mL). A negative result must be combined with clinical observations, patient history, and epidemiological information. The expected result is Negative.  Fact Sheet for Patients:  bloggercourse.com  Fact Sheet for Healthcare Providers:  seriousbroker.it  This test is no t yet approved or cleared by the [place]  FDA and  has been authorized for detection and/or diagnosis of SARS-CoV-2 by FDA under an Emergency Use Authorization (EUA). This EUA will remain  in effect (meaning this test can be used) for the duration of the COVID-19 declaration under Section 564(b)(1) of the Act, 21 U.S.C.section 360bbb-3(b)(1), unless the authorization is terminated  or revoked sooner.       Influenza A by PCR NEGATIVE NEGATIVE Final   Influenza B by PCR NEGATIVE NEGATIVE Final    Comment: (NOTE) The Xpert Xpress SARS-CoV-2/FLU/RSV plus assay is intended as an aid in the diagnosis of influenza from Nasopharyngeal swab specimens and should not be used as a sole basis for treatment. Nasal washings and aspirates are unacceptable for Xpert Xpress SARS-CoV-2/FLU/RSV testing.  Fact Sheet for Patients: bloggercourse.com  Fact Sheet for Healthcare Providers: seriousbroker.it  This test is not yet approved or cleared by the [place]  FDA and has been authorized for detection and/or diagnosis of SARS-CoV-2 by FDA under an Emergency Use Authorization (EUA). This EUA will remain in effect (meaning this test can be used) for the duration of the COVID-19 declaration under Section 564(b)(1) of the Act, 21 U.S.C. section 360bbb-3(b)(1), unless the authorization is terminated or revoked.     Resp Syncytial Virus by PCR NEGATIVE NEGATIVE Final     Comment: (NOTE) Fact Sheet for Patients: bloggercourse.com  Fact Sheet for Healthcare Providers: seriousbroker.it  This test is not yet approved or cleared by the [place]  FDA and has been authorized for detection and/or diagnosis of SARS-CoV-2 by FDA under an Emergency Use Authorization (EUA). This EUA will remain in effect (meaning this test can be used) for the duration of the COVID-19 declaration under Section 564(b)(1) of the Act, 21 U.S.C. section 360bbb-3(b)(1), unless the authorization is terminated or revoked.  Performed at [facility], [address] [place], [place] [address]   Respiratory (~20 pathogens) panel by PCR     Status: None   Collection Time: 03/21/24  9:37 AM   Specimen: Nasopharyngeal Swab; Respiratory  Result Value Ref Range Status   Adenovirus NOT DETECTED NOT DETECTED Final   Coronavirus 229E NOT DETECTED NOT DETECTED Final    Comment: (NOTE) The Coronavirus on the Respiratory Panel, DOES NOT test for the novel  Coronavirus (2019 nCoV)    Coronavirus HKU1 NOT DETECTED NOT DETECTED Final   Coronavirus NL63 NOT DETECTED NOT DETECTED Final   Coronavirus OC43 NOT DETECTED NOT DETECTED Final   Metapneumovirus NOT DETECTED NOT DETECTED Final   Rhinovirus / Enterovirus NOT DETECTED NOT DETECTED Final   Influenza A NOT DETECTED NOT DETECTED Final   Influenza B NOT DETECTED NOT DETECTED Final   Parainfluenza Virus 1 NOT DETECTED NOT DETECTED Final   Parainfluenza Virus 2 NOT DETECTED NOT DETECTED Final   Parainfluenza Virus 3 NOT  DETECTED NOT DETECTED Final   Parainfluenza Virus 4 NOT DETECTED NOT DETECTED Final   Respiratory Syncytial Virus NOT DETECTED NOT DETECTED Final   Bordetella pertussis NOT DETECTED NOT DETECTED Final   Bordetella Parapertussis NOT DETECTED NOT DETECTED Final   Chlamydophila pneumoniae NOT DETECTED NOT DETECTED Final   Mycoplasma pneumoniae NOT DETECTED NOT  DETECTED Final    Comment: Performed at [facility], [address], [place], [place] [address]     Radiology Studies: DG Chest 2 View Result Date: 03/26/2024 EXAM: 2 VIEW(S) XRAY OF THE CHEST 03/26/2024 08:12:00 AM COMPARISON: 03/22/2024 CLINICAL HISTORY: Hypoxia. FINDINGS: LINES, TUBES AND DEVICES: Right internal jugular Port-A-Cath in place with tip at cavoatrial junction. Soft tissue anchors over right humeral head. LUNGS AND PLEURA: Small left pleural effusion, increased, with worsening aeration to the left mid and left lower lung. Mild pulmonary edema. Increased hazy opacity in left upper lung. No pneumothorax. HEART AND MEDIASTINUM: Mild cardiomegaly, stable. No acute abnormality of the mediastinal silhouette. BONES AND SOFT TISSUES: No acute osseous abnormality. IMPRESSION: 1. Increased left pleural effusion with worsening aeration to the left lower lobe. 2. Increased hazy lung opacities within the left upper lobe, which may reflect asymmetric edema versus infection. Electronically signed by: Waddell Calk MD 03/26/2024 09:29 AM EST RP Workstation: [id number]     Scheduled Meds:  albuterol   2.5 mg Nebulization BID   ALPRAZolam   0.5 mg Oral QHS   amiodarone   400 mg Oral BID   Followed by   NOREEN ON 03/31/2024] amiodarone   200 mg Oral Daily   apixaban   10 mg Oral BID   Followed by   NOREEN ON 04/01/2024] apixaban   5 mg Oral BID   vitamin C   500 mg Oral BID   Chlorhexidine  Gluconate Cloth  6 each Topical Daily   dextromethorphan -guaiFENesin   1 tablet Oral BID   DULoxetine   30 mg Oral Daily   magic mouthwash w/lidocaine   5 mL Oral QID   metoprolol  tartrate  25 mg Oral BID   multivitamin with minerals  1 tablet Oral Daily   Ensure Max Protein  11 oz Oral BID   thiamine   100 mg Oral Daily   Continuous Infusions:   Time spent: 35 mins   LOS: 8 days  MDM: Patient is high risk for one or more organ failure.  They necessitate ongoing hospitalization for continued IV  therapies and subsequent lab monitoring. Total time spent interpreting labs and vitals, reviewing the medical record, coordinating care amongst consultants and care team members, directly assessing and discussing care with the patient and/or family Cresencio Fairly, MD [facility]  To contact the attending physician between 7A-7P please use Epic Chat. To contact the covering physician during after hours 7P-7A, please review Amion.  03/26/2024, 3:46 PM   *This document has been created with the assistance of dictation software. Please excuse typographical errors. *

## 2024-03-26 NOTE — Progress Notes (Signed)
 Indications to remove foley. Order in. Patient/family is refusing removal.

## 2024-03-27 DIAGNOSIS — A419 Sepsis, unspecified organism: Secondary | ICD-10-CM | POA: Diagnosis not present

## 2024-03-27 DIAGNOSIS — N179 Acute kidney failure, unspecified: Secondary | ICD-10-CM | POA: Diagnosis not present

## 2024-03-27 DIAGNOSIS — I2699 Other pulmonary embolism without acute cor pulmonale: Secondary | ICD-10-CM | POA: Diagnosis not present

## 2024-03-27 DIAGNOSIS — I2602 Saddle embolus of pulmonary artery with acute cor pulmonale: Secondary | ICD-10-CM | POA: Diagnosis not present

## 2024-03-27 DIAGNOSIS — I2692 Saddle embolus of pulmonary artery without acute cor pulmonale: Secondary | ICD-10-CM | POA: Diagnosis not present

## 2024-03-27 LAB — CBC
HCT: 27.4 % — ABNORMAL LOW (ref 36.0–46.0)
Hemoglobin: 8.7 g/dL — ABNORMAL LOW (ref 12.0–15.0)
MCH: 24.6 pg — ABNORMAL LOW (ref 26.0–34.0)
MCHC: 31.8 g/dL (ref 30.0–36.0)
MCV: 77.4 fL — ABNORMAL LOW (ref 80.0–100.0)
Platelets: 426 K/uL — ABNORMAL HIGH (ref 150–400)
RBC: 3.54 MIL/uL — ABNORMAL LOW (ref 3.87–5.11)
RDW: 22.3 % — ABNORMAL HIGH (ref 11.5–15.5)
WBC: 16 K/uL — ABNORMAL HIGH (ref 4.0–10.5)
nRBC: 0 % (ref 0.0–0.2)

## 2024-03-27 LAB — COMPREHENSIVE METABOLIC PANEL WITH GFR
ALT: 15 U/L (ref 0–44)
AST: 22 U/L (ref 15–41)
Albumin: 2.8 g/dL — ABNORMAL LOW (ref 3.5–5.0)
Alkaline Phosphatase: 200 U/L — ABNORMAL HIGH (ref 38–126)
Anion gap: 6 (ref 5–15)
BUN: 16 mg/dL (ref 8–23)
CO2: 31 mmol/L (ref 22–32)
Calcium: 8.6 mg/dL — ABNORMAL LOW (ref 8.9–10.3)
Chloride: 89 mmol/L — ABNORMAL LOW (ref 98–111)
Creatinine, Ser: 0.45 mg/dL (ref 0.44–1.00)
GFR, Estimated: >60 mL/min (ref >60)
Glucose, Bld: 123 mg/dL — ABNORMAL HIGH (ref 70–99)
Potassium: 4.3 mmol/L (ref 3.5–5.1)
Sodium: 126 mmol/L — ABNORMAL LOW (ref 135–145)
Total Bilirubin: 0.3 mg/dL (ref 0.0–1.2)
Total Protein: 6.1 g/dL — ABNORMAL LOW (ref 6.5–8.1)

## 2024-03-27 MED ORDER — KETOROLAC TROMETHAMINE 15 MG/ML IJ SOLN
15.0000 mg | Freq: Once | INTRAMUSCULAR | Status: AC
  Administered 2024-03-27: 15 mg via INTRAVENOUS
  Filled 2024-03-27: qty 1

## 2024-03-27 MED ORDER — HYDROCODONE-ACETAMINOPHEN 5-325 MG PO TABS
1.0000 | ORAL_TABLET | ORAL | Status: DC | PRN
  Administered 2024-03-27: 1 via ORAL
  Filled 2024-03-27: qty 1

## 2024-03-27 NOTE — TOC Progression Note (Signed)
 Transition of Care [facility]) - Progression Note    Patient Details  Name: Hannah Yu MRN: [id number] Date of Birth: April 26, 1946  [facility] CM/SW Contact  K'La JINNY Ruts, LCSW Phone Number: 03/27/2024, 2:40 PM  Clinical Narrative:    Chart reviewed. I spoke to the patient brother and explained everything the rep reported to SW about DME (please refer to my notes). The patient brother verbalized understanding. I have provided the patient brother with private care agencies.                     Expected Discharge Plan and Services                                               Social Drivers of Health (SDOH) Interventions SDOH Screenings   Food Insecurity: No Food Insecurity (03/20/2024)  Housing: Low Risk  (03/20/2024)  Transportation Needs: No Transportation Needs (03/20/2024)  Utilities: Not At Risk (03/20/2024)  Depression (PHQ2-9): Low Risk  (02/24/2024)  Financial Resource Strain: Low Risk  (12/08/2023)   Received from [facility]  Social Connections: Moderately Isolated (03/20/2024)  Tobacco Use: Medium Risk (03/18/2024)    Readmission Risk Interventions     No data to display

## 2024-03-27 NOTE — Progress Notes (Signed)
 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  TTE with RV strain currently on heparin  drip.  Course complicated by A-fib with RVR requiring amiodarone  drip. Initially required levophed  gtt which has been wean off. Cardiology following. Hospital course as below  11/19: transfer to PCU, wound care c/s 11/20: change to PO Amiodarone  11/21: Lasix  80 mg IV once, change Heparin  to Eliquis , transfer to med-surg. DC tele 11/22: Anemia panel, chest x-ray.  Continue to wean oxygen.  Discontinue Foley-family refused 11/23: Setting up DME's and home health for discharge home next 1 to 2 days.  Given 1 dose of Toradol  for foot pain   Subjective:  Having right foot pain and would like to avoid narcotic  Objective: Vitals:   03/27/24 0728 03/27/24 0953 03/27/24 1013 03/27/24 1415  BP: (!) 177/84 128/66 128/66 (!) 144/72  Pulse: 76  76 65  Resp: 19   20  Temp: 98.3 F (36.8 C)     TempSrc: Oral     SpO2: 99%   100%  Weight:      Height:        Intake/Output Summary (Last 24 hours) at 03/27/2024 1603 Last data filed at 03/27/2024 0600 Gross per 24 hour  Intake 200 ml  Output --  Net 200 ml   Filed Weights   03/25/24 0500 03/26/24 0500 03/27/24 0703  Weight: 72.2 kg (P) 81 kg 79.8 kg    Examination: General: chronically-ill appearing female HENT: Supple, no JVD Lungs: Clear to auscultation bilaterally Cardiovascular: Irregular  irregular, no m/r/g, trace bilateral lower extremity edema  Abdomen: +BS x4, non distended, non tender  Extremities: Right foot/ankle tenderness Neuro: Alert and oriented, follows commands GU: Indwelling foley catheter draining dark yellow urine   Assessment & Plan:  Acute segmental PE - US  shows nonocclusive thrombus in right posterior tibial and peroneal vein - IR consulted for acute segmental PE clot burden minimal and thrombolysis contraindicated due to brain metastasis   - Continue Eliquis .  Started on 11/21  Community Acquired Pneumonia - RVP negative, MRSA nares negative - Blood culture 11/14 NGTD - Completed Abx for 5 days  Acute hypoxic respiratory failure  - Suspect likely due to acute segmental PE, possible pneumonia, A fib, volume overload - Was changed from BiPAP to HFNC 9 liter -> 3 liters today.  Continue weaning efforts - Wean oxygen as able to keep sats >88% - Enouraged use of incentive spirometry  Atrial fibrillation with RVR Cardiogenic shock, pulmonary HTN - Echo 03/19/24: EF 60 to 65%, grade I diastolic dysfunction, right ventricle size moderately enlarged, severely elevated pulmonary artery systolic pressures, severe tricuspid valve regurgitation - continue p.o. amiodarone  load with 400 mg twice daily for 7 days followed by 200 mg daily. - continue metoprolol  to 25 mg twice daily.   Metastatic adenocarcinoma  - Seen by oncology, appreciate recs - New severe T6 compression fracture - XR hip increased lucency at the acetabulum on the left, likely related  to metastatic disease.  Possibility of superimposed fracture - Oncology and Palliative care following - overall poor prognosis.  She is DNR  Hyponatremia  - Sodium improved from 117 on presentation, slowly up to 126   AKI - resolved - Monitor Cr  Acute Metabolic encephalopathy - Resolved now.  Avoid narcotics  Generalized Weakness - PT/OT recommends SNF. Patient declines and would like to go home with  [facility] - aide as well as PT, OT, RN   DVT prophylaxis: Eliquis    Code Status: Limited: Do not attempt resuscitation (DNR) -DNR-LIMITED -Do Not Intubate/DNI  Disposition:  possible DC in next 1-2 days depending on clinical condition  Consultants:  PCCM Cardiology Onco  Antimicrobials:  Anti-infectives (From admission, onward)    Start     Dose/Rate Route Frequency Ordered Stop   03/22/24 1815  azithromycin  (ZITHROMAX ) 500 mg in sodium chloride  0.9 % 250 mL IVPB        500 mg 250 mL/hr over 60 Minutes Intravenous Every 24 hours 03/22/24 1256 03/22/24 1833   03/22/24 1600  cefTRIAXone  (ROCEPHIN ) 2 g in sodium chloride  0.9 % 100 mL IVPB        2 g 200 mL/hr over 30 Minutes Intravenous Every 24 hours 03/22/24 1449 03/24/24 1507   03/20/24 0600  vancomycin  (VANCOREADY) IVPB 750 mg/150 mL  Status:  Discontinued       Placed in Followed by Linked Group   750 mg 150 mL/hr over 60 Minutes Intravenous Every 24 hours 03/19/24 0530 03/19/24 1049   03/19/24 1815  azithromycin  (ZITHROMAX ) 500 mg in sodium chloride  0.9 % 250 mL IVPB  Status:  Discontinued        500 mg 250 mL/hr over 60 Minutes Intravenous Every 24 hours 03/19/24 1716 03/22/24 1256   03/19/24 1400  cefTRIAXone  (ROCEPHIN ) 2 g in sodium chloride  0.9 % 100 mL IVPB        2 g 200 mL/hr over 30 Minutes Intravenous Every 24 hours 03/19/24 1049 03/22/24 2359   03/19/24 0800  ceFEPIme  (MAXIPIME ) 2 g in sodium chloride  0.9 % 100 mL IVPB  Status:  Discontinued        2 g 200 mL/hr over 30 Minutes Intravenous Every 12 hours 03/19/24 0523 03/19/24 1049   03/19/24 0615  vancomycin  (VANCOREADY) IVPB 1750 mg/350 mL       Placed in Followed by Linked Group   1,750 mg 175 mL/hr over 120 Minutes Intravenous  Once 03/19/24 0530 03/19/24 0812   03/18/24 1945  cefTRIAXone  (ROCEPHIN ) 2 g in sodium chloride  0.9 % 100 mL IVPB        2 g 200 mL/hr over 30 Minutes Intravenous  Once 03/18/24 1930 03/18/24 2202   03/18/24 1945  azithromycin  (ZITHROMAX )  500 mg in sodium chloride  0.9 % 250 mL IVPB        500 mg 250 mL/hr over 60 Minutes Intravenous  Once 03/18/24 1930 03/19/24 0001       Data Reviewed: I have personally reviewed following labs and imaging studies CBC: Recent Labs  Lab 03/23/24 0425 03/24/24 0335 03/25/24 0450 03/26/24 0755 03/27/24 0400  WBC 25.1* 19.5* 15.7* 13.7* 16.0*  HGB 8.1* 8.7* 8.0* 8.2* 8.7*  HCT 25.9* 27.6* 25.8* 27.2* 27.4*  MCV 78.7* 79.5* 77.9* 79.3* 77.4*  PLT 414* 402* 419* 435* 426*   Basic Metabolic Panel: Recent Labs  Lab 03/22/24 0442 03/23/24 0425 03/24/24 0335 03/25/24 0450 03/26/24 0755 03/27/24 0400  NA 127* 127* 128* 128* 126* 126*  K 4.3  4.3 4.0 4.2 4.1 4.3  CL 92* 95* 92* 93* 91* 89*  CO2 25 25 27 28 28 31   GLUCOSE 124* 122* 120* 120* 110* 123*  BUN 19 16 11 10 15 16   CREATININE 0.87 0.65 0.58 0.53 0.54 0.45  CALCIUM 8.4* 8.2* 8.3* 8.4* 8.6* 8.6*  MG 2.1 2.0 1.8  --   --   --   PHOS 3.0 2.3* 2.7 2.9 3.1  --    GFR: Estimated Creatinine Clearance: 53.3 mL/min (by C-G formula based on SCr of 0.45 mg/dL). Liver Function Tests: Recent Labs  Lab 03/21/24 0451 03/27/24 0400  AST 26 22  ALT 27 15  ALKPHOS 240* 200*  BILITOT 0.2 0.3  PROT 5.8* 6.1*  ALBUMIN  2.7* 2.8*   CBG: No results for input(s): GLUCAP in the last 168 hours.   Recent Results (from the past 240 hours)  Blood Culture (routine x 2)     Status: None   Collection Time: 03/18/24  6:00 PM   Specimen: BLOOD  Result Value Ref Range Status   Specimen Description BLOOD RIGHT ANTECUBITAL  Final   Special Requests   Final    BOTTLES DRAWN AEROBIC AND ANAEROBIC Blood Culture results may not be optimal due to an inadequate volume of blood received in culture bottles   Culture   Final    NO GROWTH 5 DAYS Performed at [facility], [address] [place], [place] [address]    Report Status 03/23/2024 FINAL  Final  Blood Culture (routine x 2)     Status: None   Collection Time: 03/18/24  6:05  PM   Specimen: BLOOD  Result Value Ref Range Status   Specimen Description BLOOD BLOOD LEFT HAND  Final   Special Requests   Final    BOTTLES DRAWN AEROBIC AND ANAEROBIC Blood Culture results may not be optimal due to an inadequate volume of blood received in culture bottles   Culture   Final    NO GROWTH 5 DAYS Performed at [facility], [address] [place], [place] [address]    Report Status 03/23/2024 FINAL  Final  MRSA Next Gen by PCR, Nasal     Status: None   Collection Time: 03/19/24  5:04 AM   Specimen: Anterior Nasal Swab  Result Value Ref Range Status   MRSA by PCR Next Gen NOT DETECTED NOT DETECTED Final    Comment: (NOTE) The GeneXpert MRSA Assay (FDA approved for NASAL specimens only), is one component of a comprehensive MRSA colonization surveillance program. It is not intended to diagnose MRSA infection nor to guide or monitor treatment for MRSA infections. Test performance is not FDA approved in patients less than 39 years old. Performed at [facility], [address] [place], [place] [address]   Resp panel by RT-PCR (RSV, Flu A&B, Covid) Anterior Nasal Swab     Status: None   Collection Time: 03/19/24  5:04 AM   Specimen: Anterior Nasal Swab  Result Value Ref Range Status   SARS Coronavirus 2 by RT PCR NEGATIVE NEGATIVE Final    Comment: (NOTE) SARS-CoV-2 target nucleic acids are NOT DETECTED.  The SARS-CoV-2 RNA is generally detectable in upper respiratory specimens during the acute phase of infection. The lowest concentration of SARS-CoV-2 viral copies this assay can detect is 138 copies/mL. A negative result does not preclude SARS-Cov-2 infection and should not be used as the sole basis for treatment or other patient management decisions. A negative result may occur with  improper specimen collection/handling, submission of specimen other than nasopharyngeal swab, presence of viral mutation(s) within the areas targeted by this  assay, and inadequate number of viral copies(<138 copies/mL). A negative result must be combined with clinical observations, patient history, and epidemiological information. The expected result is Negative.  Fact Sheet for Patients:  bloggercourse.com  Fact Sheet for Healthcare Providers:  seriousbroker.it  This test is no t yet approved or cleared by the [place]  FDA and  has been authorized for detection and/or diagnosis of SARS-CoV-2 by FDA under an Emergency Use Authorization (EUA). This EUA will remain  in effect (meaning this test can be used) for the duration of the COVID-19 declaration under Section 564(b)(1) of the Act, 21 U.S.C.section 360bbb-3(b)(1), unless the authorization is terminated  or revoked sooner.       Influenza A by PCR NEGATIVE NEGATIVE Final   Influenza B by PCR NEGATIVE NEGATIVE Final    Comment: (NOTE) The Xpert Xpress SARS-CoV-2/FLU/RSV plus assay is intended as an aid in the diagnosis of influenza from Nasopharyngeal swab specimens and should not be used as a sole basis for treatment. Nasal washings and aspirates are unacceptable for Xpert Xpress SARS-CoV-2/FLU/RSV testing.  Fact Sheet for Patients: bloggercourse.com  Fact Sheet for Healthcare Providers: seriousbroker.it  This test is not yet approved or cleared by the [place]  FDA and has been authorized for detection and/or diagnosis of SARS-CoV-2 by FDA under an Emergency Use Authorization (EUA). This EUA will remain in effect (meaning this test can be used) for the duration of the COVID-19 declaration under Section 564(b)(1) of the Act, 21 U.S.C. section 360bbb-3(b)(1), unless the authorization is terminated or revoked.     Resp Syncytial Virus by PCR NEGATIVE NEGATIVE Final    Comment: (NOTE) Fact Sheet for Patients: bloggercourse.com  Fact Sheet for  Healthcare Providers: seriousbroker.it  This test is not yet approved or cleared by the [place]  FDA and has been authorized for detection and/or diagnosis of SARS-CoV-2 by FDA under an Emergency Use Authorization (EUA). This EUA will remain in effect (meaning this test can be used) for the duration of the COVID-19 declaration under Section 564(b)(1) of the Act, 21 U.S.C. section 360bbb-3(b)(1), unless the authorization is terminated or revoked.  Performed at [facility], [address] [place], [place] [address]   Respiratory (~20 pathogens) panel by PCR     Status: None   Collection Time: 03/21/24  9:37 AM   Specimen: Nasopharyngeal Swab; Respiratory  Result Value Ref Range Status   Adenovirus NOT DETECTED NOT DETECTED Final   Coronavirus 229E NOT DETECTED NOT DETECTED Final    Comment: (NOTE) The Coronavirus on the Respiratory Panel, DOES NOT test for the novel  Coronavirus (2019 nCoV)    Coronavirus HKU1 NOT DETECTED NOT DETECTED Final   Coronavirus NL63 NOT DETECTED NOT DETECTED Final   Coronavirus OC43 NOT DETECTED NOT DETECTED Final   Metapneumovirus NOT DETECTED NOT DETECTED Final   Rhinovirus / Enterovirus NOT DETECTED NOT DETECTED Final   Influenza A NOT DETECTED NOT DETECTED Final   Influenza B NOT DETECTED NOT DETECTED Final   Parainfluenza Virus 1 NOT DETECTED NOT DETECTED Final   Parainfluenza Virus 2 NOT DETECTED NOT DETECTED Final   Parainfluenza Virus 3 NOT DETECTED NOT DETECTED Final   Parainfluenza Virus 4 NOT DETECTED NOT DETECTED Final   Respiratory Syncytial Virus NOT DETECTED NOT DETECTED Final   Bordetella pertussis NOT DETECTED NOT DETECTED Final   Bordetella Parapertussis NOT DETECTED NOT DETECTED Final  Chlamydophila pneumoniae NOT DETECTED NOT DETECTED Final   Mycoplasma pneumoniae NOT DETECTED NOT DETECTED Final    Comment: Performed at [facility], [address] [place], [place] [address]      Radiology Studies: DG Chest 2 View Result Date: 03/26/2024 EXAM: 2 VIEW(S) XRAY OF THE CHEST 03/26/2024 08:12:00 AM COMPARISON: 03/22/2024 CLINICAL HISTORY: Hypoxia. FINDINGS: LINES, TUBES AND DEVICES: Right internal jugular Port-A-Cath in place with tip at cavoatrial junction. Soft tissue anchors over right humeral head. LUNGS AND PLEURA: Small left pleural effusion, increased, with worsening aeration to the left mid and left lower lung. Mild pulmonary edema. Increased hazy opacity in left upper lung. No pneumothorax. HEART AND MEDIASTINUM: Mild cardiomegaly, stable. No acute abnormality of the mediastinal silhouette. BONES AND SOFT TISSUES: No acute osseous abnormality. IMPRESSION: 1. Increased left pleural effusion with worsening aeration to the left lower lobe. 2. Increased hazy lung opacities within the left upper lobe, which may reflect asymmetric edema versus infection. Electronically signed by: Waddell Calk MD 03/26/2024 09:29 AM EST RP Workstation: [id number]     Scheduled Meds:  albuterol   2.5 mg Nebulization BID   ALPRAZolam   0.5 mg Oral QHS   amiodarone   400 mg Oral BID   Followed by   NOREEN ON 03/31/2024] amiodarone   200 mg Oral Daily   apixaban   10 mg Oral BID   Followed by   NOREEN ON 04/01/2024] apixaban   5 mg Oral BID   vitamin C   500 mg Oral BID   Chlorhexidine  Gluconate Cloth  6 each Topical Daily   dextromethorphan -guaiFENesin   1 tablet Oral BID   DULoxetine   30 mg Oral Daily   magic mouthwash w/lidocaine   5 mL Oral QID   metoprolol  tartrate  25 mg Oral BID   Ensure Max Protein  11 oz Oral BID   thiamine   100 mg Oral Daily   Continuous Infusions:   Time spent: 35 mins   LOS: 9 days  MDM: Patient is high risk for one or more organ failure.  They necessitate ongoing hospitalization for continued IV therapies and subsequent lab monitoring. Total time spent interpreting labs and vitals, reviewing the medical record, coordinating care amongst consultants and care  team members, directly assessing and discussing care with the patient and/or family Cresencio Fairly, MD [facility]  To contact the attending physician between 7A-7P please use Epic Chat. To contact the covering physician during after hours 7P-7A, please review Amion.  03/27/2024, 4:03 PM   *This document has been created with the assistance of dictation software. Please excuse typographical errors. *

## 2024-03-27 NOTE — TOC Progression Note (Addendum)
 Transition of Care [facility]) - Progression Note    Patient Details  Name: [name] MRN: [id number] Date of Birth: 1945-06-06  Transition of [facility]) CM/SW Contact  K'La JINNY Ruts, LCSW Phone Number: 03/27/2024, 12:05 PM  Clinical Narrative:    Chart reviewed. Received a secure chart from provider to follow up with the patient and the patient family about DME. The patient DME needs are a hospital bed, hoyer lift, BSC, bed pan, wheel chair, walker and oxygen machine. I have called the referral line and spoke with Tania and she confirmed that a rep will call SW back. SW provided Tania with contact information.   SW was will follow up with the patient and the patient family about private care.    1:10pm: SW spoke with adapt rep., Renae.The rep informed me that the Hospital bed, hoyer lift, and wheel chair will be process.  The rep informed me that the patient would have to pay out of pocket for BSC ($78.56) and Walker ($103.70) due to insurance only pays for this DME every 5 years and per the rep the patient received one in 2021.   Wheel chair must be ordered M-F. Please follow up with adapt. Bed pan has to be purchased on the patient own. Oxygen machine may be needed at D/C as well depending on the patient.                     Expected Discharge Plan and Services                                               Social Drivers of Health (SDOH) Interventions SDOH Screenings   Food Insecurity: No Food Insecurity (03/20/2024)  Housing: Low Risk  (03/20/2024)  Transportation Needs: No Transportation Needs (03/20/2024)  Utilities: Not At Risk (03/20/2024)  Depression (PHQ2-9): Low Risk  (02/24/2024)  Financial Resource Strain: Low Risk  (12/08/2023)   Received from [facility]  Social Connections: Moderately Isolated (03/20/2024)  Tobacco Use: Medium Risk (03/18/2024)    Readmission Risk Interventions     No data to display

## 2024-03-27 NOTE — Progress Notes (Signed)
 SUBJECTIVE: Patient has foot pain.  Breathing much better denies any chest pain or shortness of breath at this time.   Vitals:   03/27/24 0703 03/27/24 0728 03/27/24 0953 03/27/24 1013  BP:  (!) 177/84 128/66 128/66  Pulse:  76  76  Resp:  19    Temp:  98.3 F (36.8 C)    TempSrc:  Oral    SpO2:  99%    Weight: 79.8 kg     Height:        Intake/Output Summary (Last 24 hours) at 03/27/2024 1227 Last data filed at 03/27/2024 0600 Gross per 24 hour  Intake 380 ml  Output --  Net 380 ml    LABS: Basic Metabolic Panel: Recent Labs    03/25/24 0450 03/26/24 0755 03/27/24 0400  NA 128* 126* 126*  K 4.2 4.1 4.3  CL 93* 91* 89*  CO2 28 28 31   GLUCOSE 120* 110* 123*  BUN 10 15 16   CREATININE 0.53 0.54 0.45  CALCIUM 8.4* 8.6* 8.6*  PHOS 2.9 3.1  --    Liver Function Tests: Recent Labs    03/27/24 0400  AST 22  ALT 15  ALKPHOS 200*  BILITOT 0.3  PROT 6.1*  ALBUMIN  2.8*   No results for input(s): LIPASE, AMYLASE in the last 72 hours. CBC: Recent Labs    03/26/24 0755 03/27/24 0400  WBC 13.7* 16.0*  HGB 8.2* 8.7*  HCT 27.2* 27.4*  MCV 79.3* 77.4*  PLT 435* 426*   Cardiac Enzymes: No results for input(s): CKTOTAL, CKMB, CKMBINDEX, TROPONINI in the last 72 hours. BNP: Invalid input(s): POCBNP D-Dimer: No results for input(s): DDIMER in the last 72 hours. Hemoglobin A1C: No results for input(s): HGBA1C in the last 72 hours. Fasting Lipid Panel: No results for input(s): CHOL, HDL, LDLCALC, TRIG, CHOLHDL, LDLDIRECT in the last 72 hours. Thyroid  Function Tests: No results for input(s): TSH, T4TOTAL, T3FREE, THYROIDAB in the last 72 hours.  Invalid input(s): FREET3 Anemia Panel: Recent Labs    03/26/24 0755  VITAMINB12 >4,000*  FOLATE 12.3  FERRITIN 1,442*  TIBC 228*  IRON 30     PHYSICAL EXAM General: Well developed, well nourished, in no acute distress HEENT:  Normocephalic and atramatic Neck:  No JVD.   Lungs: Clear bilaterally to auscultation and percussion. Heart: HRRR . Normal S1 and S2 without gallops or murmurs.  Abdomen: Bowel sounds are positive, abdomen soft and non-tender  Msk:  Back normal, normal gait. Normal strength and tone for age. Extremities: No clubbing, cyanosis or edema.   Neuro: Alert and oriented X 3. Psych:  Good affect, responds appropriately  TELEMETRY: Sinus rhythm  ASSESSMENT AND PLAN: Acute PE/atrial fibrillation with ventricular rate well-controlled on Eliquis .  Patient is doing much better and breathing has significantly improved even though had saddle shaped acute pulmonary embolism.  Agree with discharging the patient with follow-up in the office [facility] cardiology 1 to 2 weeks.  Echocardiogram showed right-sided pressure overload with preserved ejection fraction.   ICD-10-CM   1. Acute respiratory failure with hypoxia (HCC)  J96.01     2. Acute pulmonary embolism without acute cor pulmonale, unspecified pulmonary embolism type (HCC)  I26.99     3. Severe sepsis (HCC)  A41.9    R65.20       Principal Problem:   Acute saddle pulmonary embolism (HCC) Active Problems:   Acute respiratory failure with hypoxia (HCC)   Acute pulmonary embolism without acute cor pulmonale (HCC)   Severe sepsis (HCC)  Mild pulmonic regurgitation and RV dysfunction by prior echocardiogram   AKI (acute kidney injury)   Palliative care encounter    Denyse Bathe, MD, FACC 03/27/2024 12:27 PM

## 2024-03-27 NOTE — Progress Notes (Signed)
 patient needs required repositioning of the body, in ways that can not be achieved within ordinary bed or patient requires the head of the bed to be elevated for than 30 degrees most of the time due to PE and Afib.

## 2024-03-27 NOTE — Progress Notes (Signed)
 Hoyer Lift  patient will be confined to the bed and that is why the hoyer lift is needed. Someone will be there to assist the patient

## 2024-03-27 NOTE — Plan of Care (Signed)
   Problem: Health Behavior/Discharge Planning: Goal: Ability to manage health-related needs will improve Outcome: Progressing

## 2024-03-27 NOTE — Progress Notes (Signed)
 Wheelchair  The patient mobility limitation cannot be resolved by the use of a cane or walker. The patient has enough upper extremity strength and mental capacities needed to safely operate the wheel chair

## 2024-03-27 NOTE — Progress Notes (Cosign Needed)
 Physical Therapy Treatment Patient Details Name: Hannah Yu MRN: [id number] DOB: [birth year] Today's Date: 03/27/2024   History of Present Illness 78 y/o female presented to ED on 03/18/24 after fall and hypoxia. Admitted for acute hypoxic respiratory failure with acute segmental PE. 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   She is able to participate in [facility] BLE's  with extensive assist and cues to continue with activity.  She is able to transition to EOB with mod/max a x 1 and bed features.  She requires min a x 1 throughout sitting EOB about 10 minutes for balance and general fatigue.  Seated AROM limited to LAQ's.  She overall does better today with bed mobility and sitting tolerance but global deficits remain.  She is unable to safely attempt standing at this time or lateral scooting.  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  Discussed with pt and friend in room sitting EOB as able at home with +1-+2 assist to tolerance and supine AAROM.  Voiced understanding.    Per chart, pt will transition home vs rehab with full DME and 24 hour assist.  Adjusted discharge recommendations to assist in discharge planning as she will benefit from [facility] therapies and any available supports.  If she is unable to obtain sufficient supports, transition to SNF would be necessary.     If plan is discharge home, recommend the following: Two people to help with walking and/or transfers;A lot of help with bathing/dressing/bathroom;Assistance with cooking/housework;Assist for transportation;Help with stairs or ramp for entrance   Can travel by private vehicle     No  Equipment Recommendations  Rolling walker (2 wheels);Hospital bed;BSC/3in1;Wheelchair (measurements PT);Hoyer lift;Wheelchair cushion (measurements PT)     Recommendations for Other Services       Precautions / Restrictions Precautions Precautions: Fall Recall of Precautions/Restrictions: Intact Restrictions Weight Bearing Restrictions Per Provider Order: No     Mobility  Bed Mobility Overal bed mobility: Needs Assistance Bed Mobility: Supine to Sit, Sit to Supine     Supine to sit: Used rails, HOB elevated, Mod assist, Max assist Sit to supine: Min assist, Mod assist, Used rails     Patient Response: Cooperative  Transfers                   General transfer comment: unsafe to attempt due to weakness    Ambulation/Gait                   Stairs             Wheelchair Mobility     Tilt Bed Tilt Bed Patient Response: Cooperative  Modified Rankin (Stroke Patients Only)       Balance Overall balance assessment: Needs assistance Sitting-balance support: Feet supported Sitting balance-Leahy Scale: Poor Sitting balance - Comments: +1 assist at all times - sat EOB about 10 minutes Postural control: Left lateral lean   Standing balance-Leahy Scale: Zero                              Communication Communication Communication: No apparent difficulties  Cognition Arousal: Alert Behavior During Therapy: WFL for tasks assessed/performed   PT - Cognitive impairments: No apparent impairments  Following commands: Intact      Cueing Cueing Techniques: Verbal cues  Exercises Other Exercises Other Exercises: BLE AAROM - global weakness noted    General Comments        Pertinent Vitals/Pain Pain Assessment Pain Assessment: No/denies pain    Home Living                          Prior Function            PT Goals (current goals can now be found in the care plan section) Progress towards PT goals: Progressing toward goals    Frequency    Min 3X/week      PT Plan      Co-evaluation              AM-PAC PT 6  Clicks Mobility   Outcome Measure  Help needed turning from your back to your side while in a flat bed without using bedrails?: Total Help needed moving from lying on your back to sitting on the side of a flat bed without using bedrails?: Total Help needed moving to and from a bed to a chair (including a wheelchair)?: Total Help needed standing up from a chair using your arms (e.g., wheelchair or bedside chair)?: Total Help needed to walk in hospital room?: Total Help needed climbing 3-5 steps with a railing? : Total 6 Click Score: 6    End of Session Equipment Utilized During Treatment: Oxygen Activity Tolerance: Patient tolerated treatment well;Patient limited by fatigue Patient left: in bed;with call bell/phone within reach;with bed alarm set;with family/visitor present Nurse Communication: Mobility status PT Visit Diagnosis: Muscle weakness (generalized) (M62.81);Other abnormalities of gait and mobility (R26.89);Difficulty in walking, not elsewhere classified (R26.2)     Time: 8651-8592 PT Time Calculation (min) (ACUTE ONLY): 19 min  Charges:    $Therapeutic Activity: 8-22 mins PT General Charges $$ ACUTE PT VISIT: 1 Visit                   Lauraine Gills, PTA 03/27/24, 2:17 PM

## 2024-03-27 NOTE — Progress Notes (Signed)
 On room air the patients O2 saturation range is 85-87% at rest and good wave form on pulse ox. Patient placed back on 2L nasal canula with O2 sats 92-95%.

## 2024-03-28 ENCOUNTER — Ambulatory Visit

## 2024-03-28 DIAGNOSIS — I2699 Other pulmonary embolism without acute cor pulmonale: Secondary | ICD-10-CM | POA: Diagnosis not present

## 2024-03-28 DIAGNOSIS — J9601 Acute respiratory failure with hypoxia: Secondary | ICD-10-CM | POA: Diagnosis not present

## 2024-03-28 DIAGNOSIS — I2602 Saddle embolus of pulmonary artery with acute cor pulmonale: Secondary | ICD-10-CM | POA: Diagnosis not present

## 2024-03-28 DIAGNOSIS — N179 Acute kidney failure, unspecified: Secondary | ICD-10-CM | POA: Diagnosis not present

## 2024-03-28 LAB — CBC
HCT: 29.4 % — ABNORMAL LOW (ref 36.0–46.0)
Hemoglobin: 9.1 g/dL — ABNORMAL LOW (ref 12.0–15.0)
MCH: 24.1 pg — ABNORMAL LOW (ref 26.0–34.0)
MCHC: 31 g/dL (ref 30.0–36.0)
MCV: 77.8 fL — ABNORMAL LOW (ref 80.0–100.0)
Platelets: 448 K/uL — ABNORMAL HIGH (ref 150–400)
RBC: 3.78 MIL/uL — ABNORMAL LOW (ref 3.87–5.11)
RDW: 22.5 % — ABNORMAL HIGH (ref 11.5–15.5)
WBC: 17.5 K/uL — ABNORMAL HIGH (ref 4.0–10.5)
nRBC: 0 % (ref 0.0–0.2)

## 2024-03-28 LAB — BASIC METABOLIC PANEL WITH GFR
Anion gap: 8 (ref 5–15)
BUN: 15 mg/dL (ref 8–23)
CO2: 27 mmol/L (ref 22–32)
Calcium: 8.5 mg/dL — ABNORMAL LOW (ref 8.9–10.3)
Chloride: 86 mmol/L — ABNORMAL LOW (ref 98–111)
Creatinine, Ser: 0.44 mg/dL (ref 0.44–1.00)
GFR, Estimated: >60 mL/min (ref >60)
Glucose, Bld: 116 mg/dL — ABNORMAL HIGH (ref 70–99)
Potassium: 4.2 mmol/L (ref 3.5–5.1)
Sodium: 120 mmol/L — ABNORMAL LOW (ref 135–145)

## 2024-03-28 MED ORDER — TOLVAPTAN 15 MG PO TABS
15.0000 mg | ORAL_TABLET | Freq: Once | ORAL | Status: AC
  Administered 2024-03-28: 15 mg via ORAL
  Filled 2024-03-28: qty 1

## 2024-03-28 MED ORDER — ALBUTEROL SULFATE (2.5 MG/3ML) 0.083% IN NEBU
2.5000 mg | INHALATION_SOLUTION | RESPIRATORY_TRACT | Status: DC | PRN
Start: 2024-03-28 — End: 2024-03-31

## 2024-03-28 NOTE — Progress Notes (Addendum)
 PROGRESS NOTE    Hannah Yu  FMW:[id number] DOB: 1945/11/04 DOA: 03/18/2024 PCP: Epifanio Alm SQUIBB, MD  Chief Complaint  Patient presents with   Shortness of Breath   [facility] Course:  78 y.o female with significant PMH of DVT, SVT, osteoarthritis, HTN, HLD, GERD, diverticulitis, chronic acquired lymphedema, B12 deficiency, former smoker for 27 years, progressive left lung cancer with mets to brain, and chemo related peripheral neuropathy who presented to the ED with chief complaints of progressive shortness of breath  Found to have intermediate high risk PE and possible pneumonia.  TTE with RV strain currently on heparin  drip.  Course complicated by A-fib with RVR requiring amiodarone  drip. Initially required levophed  gtt which has been wean off. Cardiology following. Hospital course as below  11/19: transfer to PCU, wound care c/s 11/20: change to PO Amiodarone  11/21: Lasix  80 mg IV once, change Heparin  to Eliquis , transfer to med-surg. DC tele 11/22: Anemia panel, chest x-ray.  Continue to wean oxygen.  Discontinue Foley-family refused 11/23: Setting up DME's and home health for discharge home next 1 to 2 days.  Given 1 dose of Toradol  for foot pain, foley removed 11/24: Na 120 so one time dose of Tolvaptan  ordered   Subjective:  Feeling better, agreeable to go home once the DMEs delivered at home  Objective: Vitals:   03/28/24 0700 03/28/24 0822 03/28/24 1755 03/28/24 2005  BP:  (!) 167/71 139/84 132/82  Pulse:  71 77   Resp:  14 16 16   Temp:  (!) 97.5 F (36.4 C) 97.6 F (36.4 C) 97.7 F (36.5 C)  TempSrc:      SpO2:  96% 98% 98%  Weight: 83.4 kg     Height:       No intake or output data in the 24 hours ending 03/28/24 2050  Filed Weights   03/26/24 0500 03/27/24 0703 03/28/24 0700  Weight: (P) 81 kg 79.8 kg 83.4 kg    Examination: General: chronically-ill appearing female HENT: Supple, no JVD Lungs: Clear to auscultation  bilaterally Cardiovascular: Irregular irregular, no m/r/g, trace bilateral lower extremity edema  Abdomen: +BS x4, non distended, non tender  Extremities: Right foot/ankle tenderness Neuro: Alert and oriented, follows commands  Assessment & Plan:  Acute segmental PE - US  shows nonocclusive thrombus in right posterior tibial and peroneal vein - IR consulted for acute segmental PE clot burden minimal and thrombolysis contraindicated due to brain metastasis   - Continue Eliquis .  Started on 11/21  Community Acquired Pneumonia - RVP negative, MRSA nares negative - Blood culture 11/14 NGTD - Completed Abx for 5 days  Acute hypoxic respiratory failure  - Suspect likely due to acute segmental PE, possible pneumonia, A fib, volume overload - Was changed from BiPAP to HFNC 9 liter -> 2 liters today.  Continue weaning efforts - Wean oxygen as able to keep sats >88% - Enouraged use of incentive spirometry  Atrial fibrillation with RVR Cardiogenic shock, pulmonary HTN - Echo 03/19/24: EF 60 to 65%, grade I diastolic dysfunction, right ventricle size moderately enlarged, severely elevated pulmonary artery systolic pressures, severe tricuspid valve regurgitation - continue p.o. amiodarone  load with 400 mg twice daily for 7 days followed by 200 mg daily. - continue metoprolol  to 25 mg twice daily.   Metastatic adenocarcinoma  - Seen by oncology, appreciate recs - New severe T6 compression fracture - XR hip increased lucency at the acetabulum on the left, likely related to metastatic disease.  Possibility of superimposed  fracture - Oncology and Palliative care following - overall poor prognosis.  She is DNR  Hyponatremia  - Sodium improved from 117 on presentation, slowly up to 126 -> 120 today.  - Will give one dose of Tolvaptan  and see if she responds  AKI - resolved - Monitor Cr  Acute Metabolic encephalopathy - Resolved now.  Avoid narcotics  Generalized Weakness - PT/OT recommends  SNF. Patient declines and would like to go home with [facility] - aide as well as PT, OT, RN   GOC Palliative care seen. Overall very poor prognosis. Family still in denial. - high risk for cardiorespi arrest, multi organ failure and death.   DVT prophylaxis: Eliquis    Code Status: Limited: Do not attempt resuscitation (DNR) -DNR-LIMITED -Do Not Intubate/DNI  Disposition:  possible DC in next 1-2 days depending on clinical condition and sodium  Consultants:  PCCM Cardiology Onco  Antimicrobials:  Anti-infectives (From admission, onward)    Start     Dose/Rate Route Frequency Ordered Stop   03/22/24 1815  azithromycin  (ZITHROMAX ) 500 mg in sodium chloride  0.9 % 250 mL IVPB        500 mg 250 mL/hr over 60 Minutes Intravenous Every 24 hours 03/22/24 1256 03/22/24 1833   03/22/24 1600  cefTRIAXone  (ROCEPHIN ) 2 g in sodium chloride  0.9 % 100 mL IVPB        2 g 200 mL/hr over 30 Minutes Intravenous Every 24 hours 03/22/24 1449 03/24/24 1507   03/20/24 0600  vancomycin  (VANCOREADY) IVPB 750 mg/150 mL  Status:  Discontinued       Placed in Followed by Linked Group   750 mg 150 mL/hr over 60 Minutes Intravenous Every 24 hours 03/19/24 0530 03/19/24 1049   03/19/24 1815  azithromycin  (ZITHROMAX ) 500 mg in sodium chloride  0.9 % 250 mL IVPB  Status:  Discontinued        500 mg 250 mL/hr over 60 Minutes Intravenous Every 24 hours 03/19/24 1716 03/22/24 1256   03/19/24 1400  cefTRIAXone  (ROCEPHIN ) 2 g in sodium chloride  0.9 % 100 mL IVPB        2 g 200 mL/hr over 30 Minutes Intravenous Every 24 hours 03/19/24 1049 03/22/24 2359   03/19/24 0800  ceFEPIme  (MAXIPIME ) 2 g in sodium chloride  0.9 % 100 mL IVPB  Status:  Discontinued        2 g 200 mL/hr over 30 Minutes Intravenous Every 12 hours 03/19/24 0523 03/19/24 1049   03/19/24 0615  vancomycin  (VANCOREADY) IVPB 1750 mg/350 mL       Placed in Followed by Linked Group   1,750 mg 175 mL/hr over 120 Minutes Intravenous  Once 03/19/24 0530 03/19/24  0812   03/18/24 1945  cefTRIAXone  (ROCEPHIN ) 2 g in sodium chloride  0.9 % 100 mL IVPB        2 g 200 mL/hr over 30 Minutes Intravenous  Once 03/18/24 1930 03/18/24 2202   03/18/24 1945  azithromycin  (ZITHROMAX ) 500 mg in sodium chloride  0.9 % 250 mL IVPB        500 mg 250 mL/hr over 60 Minutes Intravenous  Once 03/18/24 1930 03/19/24 0001       Data Reviewed: I have personally reviewed following labs and imaging studies CBC: Recent Labs  Lab 03/24/24 0335 03/25/24 0450 03/26/24 0755 03/27/24 0400 03/28/24 0520  WBC 19.5* 15.7* 13.7* 16.0* 17.5*  HGB 8.7* 8.0* 8.2* 8.7* 9.1*  HCT 27.6* 25.8* 27.2* 27.4* 29.4*  MCV 79.5* 77.9* 79.3* 77.4* 77.8*  PLT 402* 419*  435* 426* 448*   Basic Metabolic Panel: Recent Labs  Lab 03/22/24 0442 03/23/24 0425 03/24/24 0335 03/25/24 0450 03/26/24 0755 03/27/24 0400 03/28/24 0520  NA 127* 127* 128* 128* 126* 126* 120*  K 4.3 4.3 4.0 4.2 4.1 4.3 4.2  CL 92* 95* 92* 93* 91* 89* 86*  CO2 25 25 27 28 28 31 27   GLUCOSE 124* 122* 120* 120* 110* 123* 116*  BUN 19 16 11 10 15 16 15   CREATININE 0.87 0.65 0.58 0.53 0.54 0.45 0.44  CALCIUM 8.4* 8.2* 8.3* 8.4* 8.6* 8.6* 8.5*  MG 2.1 2.0 1.8  --   --   --   --   PHOS 3.0 2.3* 2.7 2.9 3.1  --   --    GFR: Estimated Creatinine Clearance: 54.6 mL/min (by C-G formula based on SCr of 0.44 mg/dL). Liver Function Tests: Recent Labs  Lab 03/27/24 0400  AST 22  ALT 15  ALKPHOS 200*  BILITOT 0.3  PROT 6.1*  ALBUMIN  2.8*   CBG: No results for input(s): GLUCAP in the last 168 hours.   Recent Results (from the past 240 hours)  MRSA Next Gen by PCR, Nasal     Status: None   Collection Time: 03/19/24  5:04 AM   Specimen: Anterior Nasal Swab  Result Value Ref Range Status   MRSA by PCR Next Gen NOT DETECTED NOT DETECTED Final    Comment: (NOTE) The GeneXpert MRSA Assay (FDA approved for NASAL specimens only), is one component of a comprehensive MRSA colonization surveillance program. It is  not intended to diagnose MRSA infection nor to guide or monitor treatment for MRSA infections. Test performance is not FDA approved in patients less than 37 years old. Performed at [facility], [address] [place], [place] [address]   Resp panel by RT-PCR (RSV, Flu A&B, Covid) Anterior Nasal Swab     Status: None   Collection Time: 03/19/24  5:04 AM   Specimen: Anterior Nasal Swab  Result Value Ref Range Status   SARS Coronavirus 2 by RT PCR NEGATIVE NEGATIVE Final    Comment: (NOTE) SARS-CoV-2 target nucleic acids are NOT DETECTED.  The SARS-CoV-2 RNA is generally detectable in upper respiratory specimens during the acute phase of infection. The lowest concentration of SARS-CoV-2 viral copies this assay can detect is 138 copies/mL. A negative result does not preclude SARS-Cov-2 infection and should not be used as the sole basis for treatment or other patient management decisions. A negative result may occur with  improper specimen collection/handling, submission of specimen other than nasopharyngeal swab, presence of viral mutation(s) within the areas targeted by this assay, and inadequate number of viral copies(<138 copies/mL). A negative result must be combined with clinical observations, patient history, and epidemiological information. The expected result is Negative.  Fact Sheet for Patients:  bloggercourse.com  Fact Sheet for Healthcare Providers:  seriousbroker.it  This test is no t yet approved or cleared by the [place]  FDA and  has been authorized for detection and/or diagnosis of SARS-CoV-2 by FDA under an Emergency Use Authorization (EUA). This EUA will remain  in effect (meaning this test can be used) for the duration of the COVID-19 declaration under Section 564(b)(1) of the Act, 21 U.S.C.section 360bbb-3(b)(1), unless the authorization is terminated  or revoked sooner.       Influenza A by  PCR NEGATIVE NEGATIVE Final   Influenza B by PCR NEGATIVE NEGATIVE Final    Comment: (NOTE) The Xpert Xpress SARS-CoV-2/FLU/RSV plus assay is  intended as an aid in the diagnosis of influenza from Nasopharyngeal swab specimens and should not be used as a sole basis for treatment. Nasal washings and aspirates are unacceptable for Xpert Xpress SARS-CoV-2/FLU/RSV testing.  Fact Sheet for Patients: bloggercourse.com  Fact Sheet for Healthcare Providers: seriousbroker.it  This test is not yet approved or cleared by the [place]  FDA and has been authorized for detection and/or diagnosis of SARS-CoV-2 by FDA under an Emergency Use Authorization (EUA). This EUA will remain in effect (meaning this test can be used) for the duration of the COVID-19 declaration under Section 564(b)(1) of the Act, 21 U.S.C. section 360bbb-3(b)(1), unless the authorization is terminated or revoked.     Resp Syncytial Virus by PCR NEGATIVE NEGATIVE Final    Comment: (NOTE) Fact Sheet for Patients: bloggercourse.com  Fact Sheet for Healthcare Providers: seriousbroker.it  This test is not yet approved or cleared by the [place]  FDA and has been authorized for detection and/or diagnosis of SARS-CoV-2 by FDA under an Emergency Use Authorization (EUA). This EUA will remain in effect (meaning this test can be used) for the duration of the COVID-19 declaration under Section 564(b)(1) of the Act, 21 U.S.C. section 360bbb-3(b)(1), unless the authorization is terminated or revoked.  Performed at [facility], [address] [place], [place] [address]   Respiratory (~20 pathogens) panel by PCR     Status: None   Collection Time: 03/21/24  9:37 AM   Specimen: Nasopharyngeal Swab; Respiratory  Result Value Ref Range Status   Adenovirus NOT DETECTED NOT DETECTED Final   Coronavirus 229E NOT  DETECTED NOT DETECTED Final    Comment: (NOTE) The Coronavirus on the Respiratory Panel, DOES NOT test for the novel  Coronavirus (2019 nCoV)    Coronavirus HKU1 NOT DETECTED NOT DETECTED Final   Coronavirus NL63 NOT DETECTED NOT DETECTED Final   Coronavirus OC43 NOT DETECTED NOT DETECTED Final   Metapneumovirus NOT DETECTED NOT DETECTED Final   Rhinovirus / Enterovirus NOT DETECTED NOT DETECTED Final   Influenza A NOT DETECTED NOT DETECTED Final   Influenza B NOT DETECTED NOT DETECTED Final   Parainfluenza Virus 1 NOT DETECTED NOT DETECTED Final   Parainfluenza Virus 2 NOT DETECTED NOT DETECTED Final   Parainfluenza Virus 3 NOT DETECTED NOT DETECTED Final   Parainfluenza Virus 4 NOT DETECTED NOT DETECTED Final   Respiratory Syncytial Virus NOT DETECTED NOT DETECTED Final   Bordetella pertussis NOT DETECTED NOT DETECTED Final   Bordetella Parapertussis NOT DETECTED NOT DETECTED Final   Chlamydophila pneumoniae NOT DETECTED NOT DETECTED Final   Mycoplasma pneumoniae NOT DETECTED NOT DETECTED Final    Comment: Performed at [facility], [address], [place], [place] [address]     Radiology Studies: No results found.    Scheduled Meds:  ALPRAZolam   0.5 mg Oral QHS   amiodarone   400 mg Oral BID   Followed by   NOREEN ON 03/31/2024] amiodarone   200 mg Oral Daily   apixaban   10 mg Oral BID   Followed by   NOREEN ON 04/01/2024] apixaban   5 mg Oral BID   vitamin C   500 mg Oral BID   Chlorhexidine  Gluconate Cloth  6 each Topical Daily   dextromethorphan -guaiFENesin   1 tablet Oral BID   DULoxetine   30 mg Oral Daily   magic mouthwash w/lidocaine   5 mL Oral QID   metoprolol  tartrate  25 mg Oral BID   Ensure Max Protein  11 oz Oral BID   thiamine   100 mg Oral Daily   Continuous Infusions:   Time spent: 35 mins   LOS: 10 days  MDM: Patient is high risk for one or more organ failure.  They necessitate ongoing hospitalization for continued IV therapies and subsequent lab  monitoring. Total time spent interpreting labs and vitals, reviewing the medical record, coordinating care amongst consultants and care team members, directly assessing and discussing care with the patient and/or family Cresencio Fairly, MD [facility]  To contact the attending physician between 7A-7P please use Epic Chat. To contact the covering physician during after hours 7P-7A, please review Amion.  03/28/2024, 8:50 PM   *This document has been created with the assistance of dictation software. Please excuse typographical errors. *

## 2024-03-28 NOTE — Plan of Care (Addendum)
 Brother conveyed concerns for NOT being able to handle patient at home alone.  PT has been using x2 assist for toileting.  Patient was also brought into the conversation and repeated my question -  Would you be ok with going to a facility if it were necessary?  Patient clearly repeated the question and then verbalized,  YES it would be ok with me, if it's best.  Brother indicated that, at this point, he will caring (alone) for patient for most nights.  Furthermore, he voiced concerns that Candis is in control of her care AND therefore would appreciate us  having another conversation with her also.   ALL staff informed of possible change in DC plans.  Will further discuss with Candis, when she arrives.   1115 Candis came and Brother already attempted to talk to her. Even with watching me need help to change patient and bed (2 people) - Still insists that they will manage. Bottom line - Patient will be going home.   1135 Patient and family informed that the hospital bed would be delivered late afternoon today.  They also voiced  understanding  - once bed is delivered - they probably would be discharged today.    1430 - Had hrt to hrt honest conversation with POA at bedside AFTER she voiced,  I dont understand why you can't just take off the oxygen.  I clearly re-iterated that her lungs are tired, and with that, it's inevitable that things will probably not get better. We talked about lung cx and possibly mets to brain, but POA stated, 'NO, oncology treated all that and it's gone.

## 2024-03-28 NOTE — Discharge Planning (Signed)
 Plan is to discharge home today - but still waiting on hospital bed to be delivered to the house.  SW indicates bed set to be delivered later today and brother left hospital to be available when it does.  Hospitalist in formed patient/family  - if bed arrives during daylight, will discharge today.  If bed arrives after daylight, will discharge home in the morning.

## 2024-03-28 NOTE — Progress Notes (Signed)
 Occupational Therapy Treatment Patient Details Name: Hannah Yu MRN: [id number] DOB: 1945/09/01 Today's Date: 03/28/2024   History of present illness 78 y/o female presented to ED on 03/18/24 after fall and hypoxia. Admitted for acute hypoxic respiratory failure with acute segmental PE. 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 Upon arrival to room pt supine resting with friend at bedside, agreeable to tx. Pt willing to transfer to the EOB, during mid navigating to the EOB with TOTALA, author noted BM down pt leg. Pt returned to supine to assess pericare and bathing needs, TOTALA +2 to return to supine. Pt required totalA for rolling on/off bed pan and pericare post BM. Pt reports the need to return to bed pan at the end of session. RN aware of pt on the bed pan, states he will attend to pt needs. Pt making progress toward goals, will continue to follow POC. Discharge recommendation remains appropriate.        If plan is discharge home, recommend the following:  Two people to help with walking and/or transfers;Two people to help with bathing/dressing/bathroom   Equipment Recommendations  BSC/3in1;Hoyer lift;Wheelchair (measurements OT);Wheelchair cushion (measurements OT);Hospital bed    Recommendations for Other Services      Precautions / Restrictions Precautions Precautions: Fall Recall of Precautions/Restrictions: Intact Restrictions Weight Bearing Restrictions Per Provider Order: No       Mobility Bed Mobility Overal bed mobility: Needs Assistance Bed Mobility: Rolling Rolling: Total assist, +2 for safety/equipment         General bed mobility comments: Rolling towards the R for bed level pericare and bed pan placement. unable to reach bed rails to aid in rolling.    Transfers                          Balance                                           ADL either performed or assessed with clinical judgement   ADL Overall ADL's : Needs assistance/impaired             Lower Body Bathing: Total assistance;+2 for safety/equipment;Cueing for safety;Bed level       Lower Body Dressing: Total assistance;Bed level       Toileting- Clothing Manipulation and Hygiene: Total assistance;+2 for physical assistance;Bed level         General ADL Comments: Pt BM on bed pan, TOTALA for bed level pericare    Extremity/Trunk Assessment              Vision       Perception     Praxis     Communication Communication Communication: No apparent difficulties   Cognition Arousal: Alert Behavior During Therapy: WFL for tasks assessed/performed Cognition: Cognition impaired         Attention impairment (select first level of impairment): Sustained attention Executive functioning impairment (select all impairments): Reasoning, Problem solving                   Following commands: Intact        Cueing   Cueing Techniques: Verbal cues, Tactile cues, Gestural cues, Visual cues  Exercises Exercises: Other exercises  Shoulder Instructions       General Comments RN aware pt on bed pan at the end of session    Pertinent Vitals/ Pain       Pain Assessment Pain Assessment: Faces Faces Pain Scale: Hurts a little bit Pain Location: generalized Pain Descriptors / Indicators: Discomfort, Grimacing Pain Intervention(s): Limited activity within patient's tolerance, Monitored during session, Repositioned  Home Living                                          Prior Functioning/Environment              Frequency  Min 2X/week        Progress Toward Goals  OT Goals(current goals can now be found in the care plan section)  Progress towards OT goals: Progressing toward goals  Acute Rehab  OT Goals OT Goal Formulation: With patient/family Time For Goal Achievement: 04/05/24 Potential to Achieve Goals: Fair ADL Goals Pt Will Perform Grooming: with modified independence;sitting;standing Pt Will Perform Lower Body Dressing: with modified independence;sit to/from stand;sitting/lateral leans Pt Will Transfer to Toilet: with modified independence;ambulating Pt Will Perform Toileting - Clothing Manipulation and hygiene: with modified independence;sitting/lateral leans;sit to/from stand  Plan      Co-evaluation                 AM-PAC OT 6 Clicks Daily Activity     Outcome Measure   Help from another person eating meals?: A Little Help from another person taking care of personal grooming?: A Lot Help from another person toileting, which includes using toliet, bedpan, or urinal?: A Lot Help from another person bathing (including washing, rinsing, drying)?: A Lot Help from another person to put on and taking off regular upper body clothing?: Total Help from another person to put on and taking off regular lower body clothing?: Total 6 Click Score: 11    End of Session Equipment Utilized During Treatment: Oxygen  OT Visit Diagnosis: Other abnormalities of gait and mobility (R26.89);Muscle weakness (generalized) (M62.81)   Activity Tolerance Patient limited by fatigue   Patient Left in bed;with call bell/phone within reach;with bed alarm set;with family/visitor present;with nursing/sitter in room   Nurse Communication Other (comment) (Need for linen and bed pan position)        Time: 8496-8471 OT Time Calculation (min): 25 min  Charges: OT General Charges $OT Visit: 1 Visit OT Treatments $Self Care/Home Management : 23-37 mins  Larraine Colas M.S. OTR/L  03/28/24, 3:47 PM

## 2024-03-28 NOTE — Progress Notes (Signed)
 [facility] - Closed CLINIC CARDIOLOGY PROGRESS NOTE   Patient ID: Hannah Yu MRN: [id number] DOB/AGE: Jan 18, 1946 78 y.o.  Admit date: 03/18/2024 Referring Physician Dr. Isadora Primary Physician Epifanio Alm SQUIBB, MD  Primary Cardiologist None Reason for Consultation PE, RV dysfunction  HPI: Hannah Yu is a 78 y.o. 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  Found to have acute segmental right PE, no evidence of right heart strain on CT.  Echo did reveal RV dysfunction.  Cardiology was consulted for further evaluation.   Interval History:  - Patient seen and examined this morning, resting in hospital bed with family at bedside. - She states that she feels overall well today.  Continues to endorse improvement in shortness of breath. Weaned to 2L [place]. - She denies any chest pain or palpitation symptoms.  Heart rate stable today. No recurrence of AF on tele.   Review of systems complete and found to be negative unless listed above   Vitals:   03/27/24 2027 03/27/24 2035 03/28/24 0352 03/28/24 0700  BP: (!) 167/70  (!) 167/79   Pulse: 70  71   Resp: 16  16   Temp: 97.6 F (36.4 C)  (!) 97.5 F (36.4 C)   TempSrc:      SpO2: 96% 96% 98%   Weight:    83.4 kg  Height:        No intake or output data in the 24 hours ending 03/28/24 0750    PHYSICAL EXAM General: Chronically ill-appearing elderly female, well nourished, in no acute distress. HEENT: Normocephalic and atraumatic. Neck: No JVD.  Lungs: Normal respiratory effort on 2L [place]. Clear bilaterally to auscultation. No wheezes, crackles, rhonchi.  Heart: Irregularly irregular, borderline controlled rate. Normal S1 and S2 without  gallops or murmurs. Radial & DP pulses 2+ bilaterally. Abdomen: Non-distended appearing.  Msk: Normal strength and tone for age. Extremities: No clubbing, cyanosis or edema.   Neuro: Alert and oriented X 3. Psych: Mood appropriate, affect congruent.    LABS: Basic Metabolic Panel: Recent Labs    03/26/24 0755 03/27/24 0400 03/28/24 0520  NA 126* 126* 120*  K 4.1 4.3 4.2  CL 91* 89* 86*  CO2 28 31 27   GLUCOSE 110* 123* 116*  BUN 15 16 15   CREATININE 0.54 0.45 0.44  CALCIUM 8.6* 8.6* 8.5*  PHOS 3.1  --   --    Liver Function Tests: Recent Labs    03/27/24 0400  AST 22  ALT 15  ALKPHOS 200*  BILITOT 0.3  PROT 6.1*  ALBUMIN  2.8*    No results for input(s): LIPASE, AMYLASE in the last 72 hours. CBC: Recent Labs    03/27/24 0400 03/28/24 0520  WBC 16.0* 17.5*  HGB 8.7* 9.1*  HCT 27.4* 29.4*  MCV 77.4* 77.8*  PLT 426* 448*   Cardiac Enzymes: No results for input(s): CKTOTAL, CKMB, CKMBINDEX, TROPONINIHS in the last 72 hours. BNP: No results for input(s): BNP in the last 72 hours. D-Dimer: No results for input(s): DDIMER in the last 72 hours. Hemoglobin A1C: No results for input(s): HGBA1C in the last 72 hours. Fasting Lipid Panel: No results for input(s): CHOL, HDL, LDLCALC, TRIG, CHOLHDL, LDLDIRECT in the last 72 hours. Thyroid  Function Tests: No results for  input(s): TSH, T4TOTAL, T3FREE, THYROIDAB in the last 72 hours.  Invalid input(s): FREET3 Anemia Panel: Recent Labs    03/26/24 0755  VITAMINB12 >4,000*  FOLATE 12.3  FERRITIN 1,442*  TIBC 228*  IRON 30    DG Chest 2 View Result Date: 03/26/2024 EXAM: 2 VIEW(S) XRAY OF THE CHEST 03/26/2024 08:12:00 AM COMPARISON: 03/22/2024 CLINICAL HISTORY: Hypoxia. FINDINGS: LINES, TUBES AND DEVICES: Right internal jugular Port-A-Cath in place with tip at cavoatrial junction. Soft tissue anchors over right humeral head. LUNGS AND PLEURA: Small left pleural effusion,  increased, with worsening aeration to the left mid and left lower lung. Mild pulmonary edema. Increased hazy opacity in left upper lung. No pneumothorax. HEART AND MEDIASTINUM: Mild cardiomegaly, stable. No acute abnormality of the mediastinal silhouette. BONES AND SOFT TISSUES: No acute osseous abnormality. IMPRESSION: 1. Increased left pleural effusion with worsening aeration to the left lower lobe. 2. Increased hazy lung opacities within the left upper lobe, which may reflect asymmetric edema versus infection. Electronically signed by: Waddell Calk MD 03/26/2024 09:29 AM EST RP Workstation: [id number]     ECHO as above  TELEMETRY (personally reviewed): SR PACs rate 90s  EKG (personally reviewed): Atrial fibrillation rate 126 bpm  DATA reviewed by me 03/28/24: last 24h vitals tele labs imaging I/O, admission H&P, PCCM notes, palliative notes  Principal Problem:   Acute saddle pulmonary embolism (HCC) Active Problems:   Acute respiratory failure with hypoxia (HCC)   Acute pulmonary embolism without acute cor pulmonale (HCC)   Severe sepsis (HCC)   Mild pulmonic regurgitation and RV dysfunction by prior echocardiogram   AKI (acute kidney injury)   Palliative care encounter    ASSESSMENT AND PLAN: Hannah Yu is a 78 y.o. 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  Found to have acute segmental right PE, no evidence of right heart strain on CT.  Echo did reveal RV dysfunction.  Cardiology was consulted for further evaluation.   # Acute pulmonary emboli # Metastatic adenocarcinoma of lung # COPD # RV dysfunction # Atrial fibrillation RVR Patient presented with worsening shortness of  breath, found to have pulmonary emboli without evidence of right heart strain on CTA.  Echo this admission with EF 60 to 65%, no WMA's, grade 1 diastolic dysfunction, moderate RV enlargement with moderately reduced function, severely elevated PASP, severe right atrial dilation, severe TR. -Continue p.o. amiodarone  load with 400 mg twice daily for 7 days followed by 200 mg daily. - Continue metoprolol  tartrate 25 mg twice daily. - Continue IV heparin  for anticoagulation. - Would not recommend pursuing right heart catheterization at this time.  Doubtful that this would provide any additional information that would overall change course of management. - Palliative medicine has been consulted.  Patient is ill with acute PE in setting of underlying metastatic lung cancer now with worsening respiratory status.  Overall prognosis is guarded.  Cardiology will sign off. Please haiku with questions or re-engage if needed. Follow up with Dr. Custovic in 1-2 weeks.   This patient's case was discussed and created with Dr. Custovic and she is in agreement.  Signed:  Danita Bloch, PA-C  03/28/2024, 7:50 AM [facility] Cardiology

## 2024-03-29 ENCOUNTER — Ambulatory Visit

## 2024-03-29 ENCOUNTER — Inpatient Hospital Stay

## 2024-03-29 DIAGNOSIS — E871 Hypo-osmolality and hyponatremia: Secondary | ICD-10-CM

## 2024-03-29 DIAGNOSIS — Z515 Encounter for palliative care: Secondary | ICD-10-CM | POA: Diagnosis not present

## 2024-03-29 DIAGNOSIS — J9601 Acute respiratory failure with hypoxia: Secondary | ICD-10-CM | POA: Diagnosis not present

## 2024-03-29 DIAGNOSIS — I2602 Saddle embolus of pulmonary artery with acute cor pulmonale: Secondary | ICD-10-CM | POA: Diagnosis not present

## 2024-03-29 DIAGNOSIS — N179 Acute kidney failure, unspecified: Secondary | ICD-10-CM | POA: Diagnosis not present

## 2024-03-29 LAB — SODIUM
Sodium: 117 mmol/L — CL (ref 135–145)
Sodium: 122 mmol/L — ABNORMAL LOW (ref 135–145)
Sodium: 122 mmol/L — ABNORMAL LOW (ref 135–145)

## 2024-03-29 LAB — BASIC METABOLIC PANEL WITH GFR
Anion gap: 7 (ref 5–15)
Anion gap: 7 (ref 5–15)
BUN: 12 mg/dL (ref 8–23)
BUN: 11 mg/dL (ref 8–23)
CO2: 29 mmol/L (ref 22–32)
CO2: 29 mmol/L (ref 22–32)
Calcium: 8.6 mg/dL — ABNORMAL LOW (ref 8.9–10.3)
Calcium: 8.6 mg/dL — ABNORMAL LOW (ref 8.9–10.3)
Chloride: 82 mmol/L — ABNORMAL LOW (ref 98–111)
Chloride: 81 mmol/L — ABNORMAL LOW (ref 98–111)
Creatinine, Ser: 0.39 mg/dL — ABNORMAL LOW (ref 0.44–1.00)
Creatinine, Ser: 0.38 mg/dL — ABNORMAL LOW (ref 0.44–1.00)
GFR, Estimated: >60 mL/min (ref >60)
GFR, Estimated: >60 mL/min (ref >60)
Glucose, Bld: 117 mg/dL — ABNORMAL HIGH (ref 70–99)
Glucose, Bld: 123 mg/dL — ABNORMAL HIGH (ref 70–99)
Potassium: 4.4 mmol/L (ref 3.5–5.1)
Potassium: 4.3 mmol/L (ref 3.5–5.1)
Sodium: 118 mmol/L — CL (ref 135–145)
Sodium: 117 mmol/L — CL (ref 135–145)

## 2024-03-29 LAB — CBC
HCT: 28.8 % — ABNORMAL LOW (ref 36.0–46.0)
Hemoglobin: 9.1 g/dL — ABNORMAL LOW (ref 12.0–15.0)
MCH: 24.1 pg — ABNORMAL LOW (ref 26.0–34.0)
MCHC: 31.6 g/dL (ref 30.0–36.0)
MCV: 76.2 fL — ABNORMAL LOW (ref 80.0–100.0)
Platelets: 513 K/uL — ABNORMAL HIGH (ref 150–400)
RBC: 3.78 MIL/uL — ABNORMAL LOW (ref 3.87–5.11)
RDW: 22.2 % — ABNORMAL HIGH (ref 11.5–15.5)
WBC: 18.7 K/uL — ABNORMAL HIGH (ref 4.0–10.5)
nRBC: 0 % (ref 0.0–0.2)

## 2024-03-29 LAB — HEPATIC FUNCTION PANEL
ALT: 18 U/L (ref 0–44)
AST: 25 U/L (ref 15–41)
Albumin: 2.8 g/dL — ABNORMAL LOW (ref 3.5–5.0)
Alkaline Phosphatase: 185 U/L — ABNORMAL HIGH (ref 38–126)
Bilirubin, Direct: 0.2 mg/dL (ref 0.0–0.2)
Indirect Bilirubin: 0.2 mg/dL — ABNORMAL LOW (ref 0.3–0.9)
Total Bilirubin: 0.3 mg/dL (ref 0.0–1.2)
Total Protein: 6.2 g/dL — ABNORMAL LOW (ref 6.5–8.1)

## 2024-03-29 LAB — OSMOLALITY: Osmolality: 253 mosm/kg — ABNORMAL LOW (ref 275–295)

## 2024-03-29 LAB — GLUCOSE, CAPILLARY: Glucose-Capillary: 154 mg/dL — ABNORMAL HIGH (ref 70–99)

## 2024-03-29 LAB — OSMOLALITY, URINE: Osmolality, Ur: 360 mosm/kg (ref 300–900)

## 2024-03-29 MED ORDER — SODIUM CHLORIDE 3 % IV SOLN
INTRAVENOUS | Status: DC
  Filled 2024-03-29 (×2): qty 500

## 2024-03-29 MED ORDER — SODIUM CHLORIDE 0.9% FLUSH: 10.0000 mL | INTRAVENOUS | Status: DC | PRN

## 2024-03-29 MED ORDER — TRAMADOL HCL 50 MG PO TABS
50.0000 mg | ORAL_TABLET | Freq: Four times a day (QID) | ORAL | Status: DC | PRN
  Administered 2024-03-29 – 2024-03-30 (×4): 50 mg via ORAL
  Filled 2024-03-29 (×4): qty 1

## 2024-03-29 MED ORDER — ONDANSETRON HCL 4 MG/2ML IJ SOLN
4.0000 mg | Freq: Four times a day (QID) | INTRAMUSCULAR | Status: DC | PRN
  Filled 2024-03-29: qty 2

## 2024-03-29 NOTE — Progress Notes (Signed)
 OT Cancellation Note  Patient Details Name: Hannah Yu MRN: [id number] DOB: 03/24/1946   Cancelled Treatment:    Reason Eval/Treat Not Completed: Medical issues which prohibited therapy. Patient currently requires a higher level of care due to acute medical issues and is not appropriate for skilled OT at this time. Please re-consult when appropriate for patient.  Larraine Colas M.S. OTR/L  03/29/24, 2:48 PM

## 2024-03-29 NOTE — Plan of Care (Signed)
  Problem: Clinical Measurements: Goal: Ability to maintain clinical measurements within normal limits will improve Outcome: Progressing Goal: Will remain free from infection Outcome: Progressing Goal: Diagnostic test results will improve Outcome: Progressing Goal: Respiratory complications will improve Outcome: Progressing Goal: Cardiovascular complication will be avoided Outcome: Progressing   Problem: Nutrition: Goal: Adequate nutrition will be maintained Outcome: Progressing   Problem: Coping: Goal: Level of anxiety will decrease Outcome: Progressing   Problem: Elimination: Goal: Will not experience complications related to bowel motility Outcome: Progressing Goal: Will not experience complications related to urinary retention Outcome: Progressing   

## 2024-03-29 NOTE — Progress Notes (Signed)
 PT Cancellation Note  Patient Details Name: Hannah Yu MRN: [id number] DOB: December 27, 1945   Cancelled Treatment:    Reason Eval/Treat Not Completed: Medical issues which prohibited therapy (Patient requiring higher level of care due to acute medical issues and is not appropriate for PT at this time. Please re-consult when ready)   Hannah Yu 03/29/2024, 2:30 PM

## 2024-03-29 NOTE — TOC Progression Note (Signed)
 Transition of Care [facility]) - Progression Note    Patient Details  Name: GALENA LOGIE MRN: [id number] Date of Birth: [birth year]  Transition of [facility]) CM/SW Contact  Alfonso Rummer, LCSW Phone Number: 03/29/2024, 4:14 PM  Clinical Narrative:     LCSW A. Rummer met with patient at bedside in room 228 with brother and friend. Mr. Mis reports wheelchair was not delivered to the home and pt is moving to ICU due to increase sodium levels.                     Expected Discharge Plan and Services                                               Social Drivers of Health (SDOH) Interventions SDOH Screenings   Food Insecurity: No Food Insecurity (03/20/2024)  Housing: Low Risk  (03/20/2024)  Transportation Needs: No Transportation Needs (03/20/2024)  Utilities: Not At Risk (03/20/2024)  Depression (PHQ2-9): Low Risk  (02/24/2024)  Financial Resource Strain: Low Risk  (12/08/2023)   Received from [facility]  Social Connections: Moderately Isolated (03/20/2024)  Tobacco Use: Medium Risk (03/18/2024)    Readmission Risk Interventions     No data to display

## 2024-03-29 NOTE — Consult Note (Signed)
 [facility]  CONSULT NOTE    Date: 03/29/2024                  Patient Name:  Hannah Yu  MRN: [id number]  DOB: 24-Mar-1946  Age / Sex: 78 y.o., female         PCP: Epifanio Alm SQUIBB, MD                 Service Requesting Consult: [facility]                 Reason for Consult: Hyponatremia            History of Present Illness: Hannah Yu is a 78 y.o.  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  H&P reports that patient lives with her brother.  Brother was able to provide some history.  States patient is nonambulatory due to generalized weakness and usually sits in a recliner during the days.  Patient is seen quietly resting in bed today.  Brother at bedside.  Patient appears chronically ill, on room air.  Sodium on ED arrival 117 and peaked to 128-129 with Lasix  and salt tabs.  Despite these interventions, sodium has again decreased to 117 today.  Patient did not respond to tolvaptan  given on 11/24.  Medications: Outpatient medications: Medications Prior to Admission  Medication Sig Dispense Refill Last Dose/Taking   aspirin  EC 81 MG tablet Take 162 mg by mouth daily. Swallow whole.   03/18/2024   Biotin 10 MG CAPS    03/18/2024   cholecalciferol  (VITAMIN D3) 25 MCG (1000 UNIT) tablet Take 1,000 Units by mouth daily.   03/18/2024   diphenoxylate -atropine  (LOMOTIL ) 2.5-0.025 MG tablet Take 1 tablet by mouth 4 (four) times daily as needed for diarrhea or loose stools. 60  tablet 2 Taking As Needed   DULoxetine  (CYMBALTA ) 30 MG capsule Take 1 capsule (30 mg total) by mouth daily. 30 capsule 3 03/18/2024   FIBER PO Take 1 tablet by mouth daily.   03/18/2024   furosemide  (LASIX ) 40 MG tablet Take 40 mg by mouth daily. 40 mg alternating 80 mg every other day   03/18/2024   ibuprofen (ADVIL) 200 MG tablet Take 400 mg by mouth every 6 (six) hours as needed for mild pain or moderate pain.   Taking As Needed   Ibuprofen-diphenhydrAMINE  HCl (IBUPROFEN PM) 200-25 MG CAPS Take 2 tablets by mouth at bedtime as needed (sleep).   Taking As Needed   LORazepam (ATIVAN) 0.5 MG tablet Take by mouth.   03/18/2024   losartan  (COZAAR ) 100 MG tablet Take 100 mg by mouth daily.   03/18/2024   magic mouthwash (multi-ingredient) oral suspension Swish and swallow 5-10 mLs 4 (four) times daily as needed. 480 mL 3 Taking As Needed   magic mouthwash w/lidocaine  SOLN Take 5 mLs by mouth 4 (four) times daily. Suspension contains equal amounts of Maalox Extra Strength, nystatin , diphenhydramine  and lidocaine . 480 mL 0 03/18/2024 Morning   magnesium  oxide (MAG-OX) 400 (240 Mg) MG tablet Take 400 mg by mouth daily.   03/18/2024   methylPREDNISolone  (MEDROL  DOSEPAK) 4 MG  TBPK tablet Start with 6 tablets for 1st day and then reduce by one tablet daily until completed. 21 each 0 Taking   metoprolol  succinate (TOPROL -XL) 50 MG 24 hr tablet Take 50 mg by mouth daily.   03/18/2024   omeprazole (PRILOSEC) 40 MG capsule Take 40 mg by mouth daily as needed.   03/18/2024   ondansetron  (ZOFRAN ) 8 MG tablet Take 1 tablet (8 mg total) by mouth every 8 (eight) hours as needed for nausea or vomiting. 30 tablet 3 Taking As Needed   oxyCODONE  (OXY IR/ROXICODONE ) 5 MG immediate release tablet Take 1-2 tablets (5-10 mg total) by mouth every 4 (four) hours as needed for severe pain (pain score 7-10). 60 tablet 0 Taking As Needed   pregabalin  (LYRICA ) 25 MG capsule Take 1 capsule (25 mg total) by mouth 2 (two) times  daily. 60 capsule 0 03/18/2024 Morning   prochlorperazine  (COMPAZINE ) 10 MG tablet TAKE ONE TABLET BY MOUTH EVERY 6 HOURS AS NEEDED FOR NAUSEA / VOMITING 30 tablet 2 Taking   sodium chloride  1 g tablet Take 1 tablet (1 g total) by mouth 3 (three) times daily with meals. 60 tablet 0 03/18/2024 Morning   sucralfate  (CARAFATE ) 1 g tablet Take 1 tablet (1 g total) by mouth 3 (three) times daily before meals. Dissolve tablet in 4 T warm water  swish and swallow 30 minutes before meals. 90 tablet 0 03/18/2024 Morning   Tetrahydrozoline HCl (VISINE OP) Place 1 drop into both eyes daily as needed (dry eyes).   Taking As Needed   vitamin B-12 (CYANOCOBALAMIN ) 1000 MCG tablet Take 1,000 mcg by mouth daily.   03/18/2024   OVER THE COUNTER MEDICATION Take 1 capsule by mouth daily. CBD gummie   Unknown    Current medications: Current Facility-Administered Medications  Medication Dose Route Frequency Provider Last Rate Last Admin   acetaminophen  (TYLENOL ) tablet 650 mg  650 mg Oral Q6H PRN Maree Hue, MD   650 mg at 03/27/24 2143   albuterol  (PROVENTIL ) (2.5 MG/3ML) 0.083% nebulizer solution 2.5 mg  2.5 mg Nebulization Q4H PRN Maree Hue, MD       ALPRAZolam  (XANAX ) tablet 0.5 mg  0.5 mg Oral QHS Nelson, Dana G, NP   0.5 mg at 03/28/24 2123   alum & mag hydroxide-simeth (MAALOX/MYLANTA) 200-200-20 MG/5ML suspension 15 mL  15 mL Oral Q6H PRN Nelson, Dana G, NP   15 mL at 03/22/24 0945   amiodarone  (PACERONE ) tablet 400 mg  400 mg Oral BID Hudson, Caralyn, PA-C   400 mg at 03/29/24 1014   Followed by   NOREEN ON 03/31/2024] amiodarone  (PACERONE ) tablet 200 mg  200 mg Oral Daily Hudson, Caralyn, PA-C       apixaban  (ELIQUIS ) tablet 10 mg  10 mg Oral BID Maree Hue, MD   10 mg at 03/29/24 1015   Followed by   NOREEN ON 04/01/2024] apixaban  (ELIQUIS ) tablet 5 mg  5 mg Oral BID Shah, Vipul, MD       artificial tears ophthalmic solution 1 drop  1 drop Both Eyes PRN Maree Hue, MD       ascorbic acid  (VITAMIN C )  tablet 500 mg  500 mg Oral BID Shah, Vipul, MD   500 mg at 03/29/24 1015   bisacodyl  (DULCOLAX) suppository 10 mg  10 mg Rectal Daily PRN Bousman, Karlie, PA-C       Chlorhexidine  Gluconate Cloth 2 % PADS 6 each  6 each Topical Daily Kathrene Almarie Bake, NP   6 each  at 03/29/24 1016   dextromethorphan -guaiFENesin  (MUCINEX  DM) 30-600 MG per 12 hr tablet 1 tablet  1 tablet Oral BID Ponnala, Shruthi, MD   1 tablet at 03/29/24 1014   docusate sodium  (COLACE) capsule 100 mg  100 mg Oral BID PRN Kathrene Almarie Bake, NP   100 mg at 03/25/24 1640   DULoxetine  (CYMBALTA ) DR capsule 30 mg  30 mg Oral Daily Dgayli, Belva, MD   30 mg at 03/29/24 1016   lip balm (CARMEX) ointment 1 Application  1 Application Topical PRN Maree Hue, MD   1 Application at 03/26/24 2216   magic mouthwash w/lidocaine   5 mL Oral QID Isadora Belva, MD   5 mL at 03/29/24 1012   menthol  (CEPACOL) lozenge 3 mg  1 lozenge Oral PRN Ponnala, Shruthi, MD   3 mg at 03/22/24 1836   metoprolol  tartrate (LOPRESSOR ) tablet 25 mg  25 mg Oral BID Hudson, Caralyn, PA-C   25 mg at 03/29/24 1014   ondansetron  (ZOFRAN ) injection 4 mg  4 mg Intravenous Q6H PRN Maree Hue, MD       Oral care mouth rinse  15 mL Mouth Rinse PRN Ponnala, Shruthi, MD   15 mL at 03/22/24 2016   polyethylene glycol (MIRALAX  / GLYCOLAX ) packet 17 g  17 g Oral Daily PRN Kathrene Almarie Bake, NP   17 g at 03/25/24 1640   protein supplement (ENSURE MAX) liquid  11 oz Oral BID Maree Hue, MD   11 oz at 03/28/24 2123   simethicone  (MYLICON) chewable tablet 80 mg  80 mg Oral QID PRN Bousman, Karlie, PA-C   80 mg at 03/27/24 0018   sodium chloride  (hypertonic) 3 % solution   Intravenous Continuous Druscilla Bald, NP          Allergies: Allergies  Allergen Reactions   Ezetimibe Other (See Comments)    Body aches, and stiffness   Codeine Diarrhea   Cortisone Swelling    injections   Lovastatin Other (See Comments)    Muscle cramps      Past Medical  History: Past Medical History:  Diagnosis Date   Acid reflux    Anemia    Anesthesia complication    Tachycardia previously, now on metoprolol    Arthritis    09/02/2019: per patient have it real bad in both hands and back   Difficult intubation    had to terminate intubation unable to advance tube for cholecystectomy 2017?   DVT (deep venous thrombosis) (HCC)    leg   Hypertension    Sciatica    09/02/2019: has had it for about 3 years     Past Surgical History: Past Surgical History:  Procedure Laterality Date   ABDOMINAL HYSTERECTOMY     CATARACT EXTRACTION W/ INTRAOCULAR LENS IMPLANT Bilateral 2007   eyes done within 1 month of each other.   IR IMAGING GUIDED PORT INSERTION  05/19/2023   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   TOTAL HIP ARTHROPLASTY Right 09/06/2019   Procedure: RIGHT TOTAL HIP ARTHROPLASTY ANTERIOR APPROACH;  Surgeon: Vernetta Lonni GRADE, MD;  Location: [facility] OR;  Service: Orthopedics;  Laterality: Right;   TOTAL HIP ARTHROPLASTY Left 07/31/2020   Procedure: LEFT TOTAL HIP ARTHROPLASTY ANTERIOR APPROACH;  Surgeon: Vernetta Lonni GRADE, MD;  Location: [facility] OR;  Service: Orthopedics;  Laterality: Left;   VIDEO BRONCHOSCOPY WITH ENDOBRONCHIAL ULTRASOUND N/A 10/06/2022   Procedure: VIDEO BRONCHOSCOPY WITH ENDOBRONCHIAL ULTRASOUND;  Surgeon: Aleskerov, Fuad,  MD;  Location: [facility] ORS;  Service: Thoracic;  Laterality: N/A;     Family History: Family History  Problem Relation Age of Onset   Breast cancer Sister 55     Social History: Social History   Socioeconomic History   Marital status: Single    Spouse name: Not on file   Number of children: Not on file   Years of education: Not on file   Highest education level: Not on file  Occupational History   Not on file  Tobacco Use   Smoking status: Former    Current packs/day: 0.00    Types: Cigarettes    Quit date: 01/02/1993    Years  since quitting: 31.2   Smokeless tobacco: Never  Vaping Use   Vaping status: Never Used  Substance and Sexual Activity   Alcohol  use: Yes    Alcohol /week: 4.0 standard drinks of alcohol     Types: 4 Glasses of wine per week    Comment: 09/02/2019: per patient ever so often   Drug use: No   Sexual activity: Not on file  Other Topics Concern   Not on file  Social History Narrative   Not on file   Social Drivers of Health   Financial Resource Strain: Low Risk  (12/08/2023)   Received from [facility]   Overall Financial Resource Strain (CARDIA)    Difficulty of Paying Living Expenses: Not hard at all  Food Insecurity: No Food Insecurity (03/20/2024)   Hunger Vital Sign    Worried About Running Out of Food in the Last Year: Never true    Ran Out of Food in the Last Year: Never true  Transportation Needs: No Transportation Needs (03/20/2024)   PRAPARE - Administrator, Civil Service (Medical): No    Lack of Transportation (Non-Medical): No  Physical Activity: Not on file  Stress: Not on file  Social Connections: Moderately Isolated (03/20/2024)   Social Connection and Isolation Panel    Frequency of Communication with Friends and Family: More than three times a week    Frequency of Social Gatherings with Friends and Family: More than three times a week    Attends Religious Services: More than 4 times per year    Active Member of [employer] or Organizations: No    Attends Banker Meetings: Never    Marital Status: Never married  Intimate Partner Violence: Not At Risk (03/20/2024)   Humiliation, Afraid, Rape, and Kick questionnaire    Fear of Current or Ex-Partner: No    Emotionally Abused: No    Physically Abused: No    Sexually Abused: No     Review of Systems: Review of Systems  Constitutional:  Positive for malaise/fatigue. Negative for chills and fever.  HENT:  Negative for congestion, sore throat and tinnitus.   Eyes:  Negative  for blurred vision and redness.  Respiratory:  Positive for shortness of breath. Negative for cough and wheezing.   Cardiovascular:  Negative for chest pain, palpitations, claudication and leg swelling.  Gastrointestinal:  Negative for abdominal pain, blood in stool, diarrhea, nausea and vomiting.  Genitourinary:  Negative for flank pain, frequency and hematuria.  Musculoskeletal:  Negative for back pain, falls and myalgias.  Skin:  Negative for rash.  Neurological:  Negative for dizziness, weakness and headaches.  Endo/Heme/Allergies:  Does not bruise/bleed easily.  Psychiatric/Behavioral:  Negative for depression. The patient is not nervous/anxious and does not have insomnia.     Vital Signs: Blood pressure ROLLEN)  154/80, pulse 71, temperature 98.1 F (36.7 C), temperature source Axillary, resp. rate 14, height 5' (1.524 m), weight 82.8 kg, SpO2 92%.  Weight trends: Filed Weights   03/27/24 0703 03/28/24 0700 03/29/24 1045  Weight: 79.8 kg 83.4 kg 82.8 kg    Physical Exam: General: Chronically ill appearing  Head: Normocephalic, atraumatic. Moist oral mucosal membranes  Eyes: Anicteric  Lungs:  Clear to auscultation, normal effort  Heart: Regular rate and rhythm  Abdomen:  Soft, nontender  Extremities:  2-3+ peripheral edema.  Neurologic: Awake  Skin: No lesions  Access: None     Lab results: Basic Metabolic Panel: Recent Labs  Lab 03/23/24 0425 03/24/24 0335 03/25/24 0450 03/26/24 0755 03/27/24 0400 03/28/24 0520 03/29/24 0159 03/29/24 0321  NA 127* 128* 128* 126*   < > 120* 118* 117*  K 4.3 4.0 4.2 4.1   < > 4.2 4.4 4.3  CL 95* 92* 93* 91*   < > 86* 82* 81*  CO2 25 27 28 28    < > 27 29 29   GLUCOSE 122* 120* 120* 110*   < > 116* 117* 123*  BUN 16 11 10 15    < > 15 12 11   CREATININE 0.65 0.58 0.53 0.54   < > 0.44 0.39* 0.38*  CALCIUM 8.2* 8.3* 8.4* 8.6*   < > 8.5* 8.6* 8.6*  MG 2.0 1.8  --   --   --   --   --   --   PHOS 2.3* 2.7 2.9 3.1  --   --   --   --     < > = values in this interval not displayed.    Liver Function Tests: Recent Labs  Lab 03/27/24 0400 03/29/24 0159  AST 22 25  ALT 15 18  ALKPHOS 200* 185*  BILITOT 0.3 0.3  PROT 6.1* 6.2*  ALBUMIN  2.8* 2.8*   No results for input(s): LIPASE, AMYLASE in the last 168 hours. No results for input(s): AMMONIA in the last 168 hours.  CBC: Recent Labs  Lab 03/25/24 0450 03/26/24 0755 03/27/24 0400 03/28/24 0520 03/29/24 0321  WBC 15.7* 13.7* 16.0* 17.5* 18.7*  HGB 8.0* 8.2* 8.7* 9.1* 9.1*  HCT 25.8* 27.2* 27.4* 29.4* 28.8*  MCV 77.9* 79.3* 77.4* 77.8* 76.2*  PLT 419* 435* 426* 448* 513*    Cardiac Enzymes: No results for input(s): CKTOTAL, CKMB, CKMBINDEX, TROPONINI in the last 168 hours.  BNP: Invalid input(s): POCBNP  CBG: Recent Labs  Lab 03/29/24 1038  GLUCAP 154*    Microbiology: Results for orders placed or performed during the hospital encounter of 03/18/24  Blood Culture (routine x 2)     Status: None   Collection Time: 03/18/24  6:00 PM   Specimen: BLOOD  Result Value Ref Range Status   Specimen Description BLOOD RIGHT ANTECUBITAL  Final   Special Requests   Final    BOTTLES DRAWN AEROBIC AND ANAEROBIC Blood Culture results may not be optimal due to an inadequate volume of blood received in culture bottles   Culture   Final    NO GROWTH 5 DAYS Performed at [facility], [address] [place], [place] [address]    Report Status 03/23/2024 FINAL  Final  Blood Culture (routine x 2)     Status: None   Collection Time: 03/18/24  6:05 PM   Specimen: BLOOD  Result Value Ref Range Status   Specimen Description BLOOD BLOOD LEFT HAND  Final   Special Requests   Final  BOTTLES DRAWN AEROBIC AND ANAEROBIC Blood Culture results may not be optimal due to an inadequate volume of blood received in culture bottles   Culture   Final    NO GROWTH 5 DAYS Performed at [facility], [address] [place], [place] [address]     Report Status 03/23/2024 FINAL  Final  MRSA Next Gen by PCR, Nasal     Status: None   Collection Time: 03/19/24  5:04 AM   Specimen: Anterior Nasal Swab  Result Value Ref Range Status   MRSA by PCR Next Gen NOT DETECTED NOT DETECTED Final    Comment: (NOTE) The GeneXpert MRSA Assay (FDA approved for NASAL specimens only), is one component of a comprehensive MRSA colonization surveillance program. It is not intended to diagnose MRSA infection nor to guide or monitor treatment for MRSA infections. Test performance is not FDA approved in patients less than 74 years old. Performed at [facility], [address] [place], [place] [address]   Resp panel by RT-PCR (RSV, Flu A&B, Covid) Anterior Nasal Swab     Status: None   Collection Time: 03/19/24  5:04 AM   Specimen: Anterior Nasal Swab  Result Value Ref Range Status   SARS Coronavirus 2 by RT PCR NEGATIVE NEGATIVE Final    Comment: (NOTE) SARS-CoV-2 target nucleic acids are NOT DETECTED.  The SARS-CoV-2 RNA is generally detectable in upper respiratory specimens during the acute phase of infection. The lowest concentration of SARS-CoV-2 viral copies this assay can detect is 138 copies/mL. A negative result does not preclude SARS-Cov-2 infection and should not be used as the sole basis for treatment or other patient management decisions. A negative result may occur with  improper specimen collection/handling, submission of specimen other than nasopharyngeal swab, presence of viral mutation(s) within the areas targeted by this assay, and inadequate number of viral copies(<138 copies/mL). A negative result must be combined with clinical observations, patient history, and epidemiological information. The expected result is Negative.  Fact Sheet for Patients:  bloggercourse.com  Fact Sheet for Healthcare Providers:  seriousbroker.it  This test is no t yet approved or  cleared by the [place]  FDA and  has been authorized for detection and/or diagnosis of SARS-CoV-2 by FDA under an Emergency Use Authorization (EUA). This EUA will remain  in effect (meaning this test can be used) for the duration of the COVID-19 declaration under Section 564(b)(1) of the Act, 21 U.S.C.section 360bbb-3(b)(1), unless the authorization is terminated  or revoked sooner.       Influenza A by PCR NEGATIVE NEGATIVE Final   Influenza B by PCR NEGATIVE NEGATIVE Final    Comment: (NOTE) The Xpert Xpress SARS-CoV-2/FLU/RSV plus assay is intended as an aid in the diagnosis of influenza from Nasopharyngeal swab specimens and should not be used as a sole basis for treatment. Nasal washings and aspirates are unacceptable for Xpert Xpress SARS-CoV-2/FLU/RSV testing.  Fact Sheet for Patients: bloggercourse.com  Fact Sheet for Healthcare Providers: seriousbroker.it  This test is not yet approved or cleared by the [place]  FDA and has been authorized for detection and/or diagnosis of SARS-CoV-2 by FDA under an Emergency Use Authorization (EUA). This EUA will remain in effect (meaning this test can be used) for the duration of the COVID-19 declaration under Section 564(b)(1) of the Act, 21 U.S.C. section 360bbb-3(b)(1), unless the authorization is terminated or revoked.     Resp Syncytial Virus by PCR NEGATIVE NEGATIVE Final    Comment: (NOTE) Fact Sheet for Patients:  bloggercourse.com  Fact Sheet for Healthcare Providers: seriousbroker.it  This test is not yet approved or cleared by the [place]  FDA and has been authorized for detection and/or diagnosis of SARS-CoV-2 by FDA under an Emergency Use Authorization (EUA). This EUA will remain in effect (meaning this test can be used) for the duration of the COVID-19 declaration under Section 564(b)(1) of the Act, 21  U.S.C. section 360bbb-3(b)(1), unless the authorization is terminated or revoked.  Performed at [facility], [address] [place], [place] [address]   Respiratory (~20 pathogens) panel by PCR     Status: None   Collection Time: 03/21/24  9:37 AM   Specimen: Nasopharyngeal Swab; Respiratory  Result Value Ref Range Status   Adenovirus NOT DETECTED NOT DETECTED Final   Coronavirus 229E NOT DETECTED NOT DETECTED Final    Comment: (NOTE) The Coronavirus on the Respiratory Panel, DOES NOT test for the novel  Coronavirus (2019 nCoV)    Coronavirus HKU1 NOT DETECTED NOT DETECTED Final   Coronavirus NL63 NOT DETECTED NOT DETECTED Final   Coronavirus OC43 NOT DETECTED NOT DETECTED Final   Metapneumovirus NOT DETECTED NOT DETECTED Final   Rhinovirus / Enterovirus NOT DETECTED NOT DETECTED Final   Influenza A NOT DETECTED NOT DETECTED Final   Influenza B NOT DETECTED NOT DETECTED Final   Parainfluenza Virus 1 NOT DETECTED NOT DETECTED Final   Parainfluenza Virus 2 NOT DETECTED NOT DETECTED Final   Parainfluenza Virus 3 NOT DETECTED NOT DETECTED Final   Parainfluenza Virus 4 NOT DETECTED NOT DETECTED Final   Respiratory Syncytial Virus NOT DETECTED NOT DETECTED Final   Bordetella pertussis NOT DETECTED NOT DETECTED Final   Bordetella Parapertussis NOT DETECTED NOT DETECTED Final   Chlamydophila pneumoniae NOT DETECTED NOT DETECTED Final   Mycoplasma pneumoniae NOT DETECTED NOT DETECTED Final    Comment: Performed at [facility], [address] [place], [place] [address]    Coagulation Studies: No results for input(s): LABPROT, INR in the last 72 hours.  Urinalysis: No results for input(s): COLORURINE, LABSPEC, PHURINE, GLUCOSEU, HGBUR, BILIRUBINUR, KETONESUR, PROTEINUR, UROBILINOGEN, NITRITE, LEUKOCYTESUR in the last 72 hours.  Invalid input(s): APPERANCEUR    Imaging: No results found.   Assessment & Plan: Hannah Yu  is a 78 y.o.  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 Treatment with Furosemide , salt tabs and Tolvaptan  unsuccessful. Recent history of lung cancer with chemo treatments. Will manage with 3% hypertonic saline at 25ml/hr. Agree with palliative care consult for goals of care.      LOS: 11 Everardo Voris 11/25/20251:25 PM

## 2024-03-29 NOTE — Consult Note (Signed)
 NAME:  Hannah Yu, MRN:  [id number], DOB:  07-18-1945, LOS: 11 ADMISSION DATE:  03/18/2024, CONSULTATION DATE:  03/29/2024 REFERRING MD:  Dr. 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 Found to have intermediate high risk PE and possible community acquired pneumonia. TTE with RV strain currently on heparin  drip. Course complicated by A-fib with RVR requiring amiodarone  drip. Course further complicated by worsening Hyponatremia requiring initiation of 3% Hypertonic saline.   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  He reports she is been nonambulatory due to generalized weakness and chemo related peripheral neuropathy.  Patient usually sits for long periods of time in the recliner and had a fall last week.  On EMS arrival patient was hypoxic with sats of 62% on room air.  She was placed on CPAP and transported to the ED.   ED Course: Initial vital signs showed HR of 130 beats/minute, BP 128/177mm Hg, the RR 21 breaths/minute,95 %O2 Sat on CPAP and Temp of 99.70F (36.4C).  Pertinent Labs/Diagnostics Findings: Na+/ K+: 117/5.0 Glucose:195 BUN/Cr.:35/1.05 AST/ALT54/30: CO2:21 AG:17 WBC: 27.3K/L  Hgb/Hct:9.9/30.7 Plts:465  PCT: 11.5  Lactic acid: 4.6~2.7 COVID PCR: Negative,  HsTrop:349~365 [id number]   CXR>? Pneumonia, left pleural effusion CTA Chest> Positive for right segmental PE   Patient was started on Heparin  gtt and abx initated for CAP coverage. PCCM consulted for admission  Please see Significant Hospital Events section below for full detailed hospital course.   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 B12 deficiency  Chronic acquired lymphedema  Diverticulitis  GERD (gastroesophageal reflux disease)  History of cataract  History of mammogram 03/01/2013  Hyperlipidemia  Hypertension  Osteoarthritis  SVT DVT  Micro Data:  Blood x2 11/14>>No growth MRSA PCR 11/15>>negative  COVID 11/15>>negative  Influenza A&B 11/15>>negative  RSV 11/15>>negative Strep pneumo & Legionella urinary antigens 11/15>>negative  RVP 11/17>>negative  Antimicrobials:   Anti-infectives (From admission, onward)    Start     Dose/Rate Route Frequency Ordered Stop   03/22/24 1815  azithromycin  (ZITHROMAX ) 500 mg in sodium chloride  0.9 % 250 mL IVPB        500 mg 250 mL/hr over 60 Minutes Intravenous Every 24 hours 03/22/24 1256 03/22/24 1833   03/22/24 1600  cefTRIAXone  (ROCEPHIN ) 2 g in sodium chloride  0.9 % 100 mL IVPB        2 g 200 mL/hr over 30 Minutes Intravenous Every 24 hours 03/22/24 1449 03/24/24 1507   03/20/24 0600  vancomycin  (VANCOREADY) IVPB 750 mg/150 mL  Status:  Discontinued       Placed in Followed by Linked Group   750 mg 150 mL/hr over 60 Minutes Intravenous Every 24 hours 03/19/24 0530 03/19/24 1049   03/19/24 1815  azithromycin  (ZITHROMAX ) 500  mg in sodium chloride  0.9 % 250 mL IVPB  Status:  Discontinued        500 mg 250 mL/hr over 60 Minutes Intravenous Every 24 hours 03/19/24 1716 03/22/24 1256   03/19/24 1400  cefTRIAXone  (ROCEPHIN ) 2 g in sodium chloride  0.9 % 100 mL IVPB         2 g 200 mL/hr over 30 Minutes Intravenous Every 24 hours 03/19/24 1049 03/22/24 2359   03/19/24 0800  ceFEPIme  (MAXIPIME ) 2 g in sodium chloride  0.9 % 100 mL IVPB  Status:  Discontinued        2 g 200 mL/hr over 30 Minutes Intravenous Every 12 hours 03/19/24 0523 03/19/24 1049   03/19/24 0615  vancomycin  (VANCOREADY) IVPB 1750 mg/350 mL       Placed in Followed by Linked Group   1,750 mg 175 mL/hr over 120 Minutes Intravenous  Once 03/19/24 0530 03/19/24 0812   03/18/24 1945  cefTRIAXone  (ROCEPHIN ) 2 g in sodium chloride  0.9 % 100 mL IVPB        2 g 200 mL/hr over 30 Minutes Intravenous  Once 03/18/24 1930 03/18/24 2202   03/18/24 1945  azithromycin  (ZITHROMAX ) 500 mg in sodium chloride  0.9 % 250 mL IVPB        500 mg 250 mL/hr over 60 Minutes Intravenous  Once 03/18/24 1930 03/19/24 0001       Significant Hospital Events: Including procedures, antibiotic start and stop dates in addition to other pertinent events   11/14: Admitted to ICU with acute hypoxic respiratory failure iso PE and possible pneumonia 11/15: TTE with RV dilation and dysfunction, switched to HFNC from BiPAP 11/16: ON HFNC, family updated at the bedside. IV furosemide  administered. Cardiology at bedside. 11/17: No acute events overnight HFNC weaned from 10L to 8L.  Not requiring vasopressors.  EKG revealed atrial fibrillation with rvr hr 140 to 160's.  Cardiology following started po metoprolol  and amiodarone  bolus followed by gtt.  11/19: transfer to PCU, wound care c/s 11/20: change to PO Amiodarone  11/21: Lasix  80 mg IV once, change Heparin  to Eliquis , transfer to med-surg. DC tele 11/22: Anemia panel, chest x-ray.  Continue to wean oxygen.  Discontinue Foley-family refused 11/23: Setting up DME's and home health for discharge home next 1 to 2 days.  Given 1 dose of Toradol  for foot pain, foley removed 11/24: Na 120 so one time dose of Tolvaptan  ordered 11/25: Pt with AMS, Sodium worsened 117 despite Lasix ,  salt tabs, and Tolvaptan .  Nephrology consulted, transfer to [facility] for 3% Hypertonic saline.  PCCM consulted.   Interim History / Subjective:  As outlined above under Significant Hospital Events section  Objective   Blood pressure (!) 124/99, pulse 81, temperature 98.1 F (36.7 C), temperature source Axillary, resp. rate 18, height 5' (1.524 m), weight 82.8 kg, SpO2 92%.       No intake or output data in the 24 hours ending 03/29/24 1225 Filed Weights   03/27/24 0703 03/28/24 0700 03/29/24 1045  Weight: 79.8 kg 83.4 kg 82.8 kg    Examination: General: Acute on chronically ill appearing frail elderly female, sitting in bed, in NAD HENT: Atraumatic, normocephalic, neck supple, difficult to assess JVD due to body habitus Lungs: Clear breath sounds throughout, even, nonlabored, normal effort Cardiovascular: RRR, s1s2, no M/R/G Abdomen: Obese, soft, nontender, nondistended, no guarding or rebound tenderness, BS+ x4 Extremities: Generalized weakness, normal bulk and tone, no deformities, warm, 1+ edema BLE Neuro: Awake and alert, oriented x3, moves all extremities  to commands GU: deferred   Resolved Hospital Problem list     Assessment & Plan:   #Acute Metabolic Encephalopathy, suspect due to severe Hyponatremia #Hallucinations -Treatment of metabolic derangements & Hyponatremia as outlined below -Provide supportive care -Promote normal sleep/wake cycle and family presence -Avoid sedating medications as able  #Severe Hyponatremia, 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 Treatment with Furosemide , salt tabs and Tolvaptan  unsuccessful, worsening Hyponatremia on 11/25 -Monitor I&O's / urinary output -Follow BMP (follow Na+ per Hypertonic saline protocol) -Ensure adequate renal perfusion -Avoid nephrotoxic agents as able -Replace electrolytes as indicated ~ Pharmacy following for assistance with electrolyte replacement -Goal correction of Na+ 6-8 mEq per 24 hrs (max correction of 10 mEq  in 24 hrs) -Nephrology consulted, appreciate input ~ plan to start Hypertonic Saline @ 25 ml/hr -Check serum Osmolarity, urine Na+ and Urine osmolality   #Acute Segmental PE #Pulmonary Hypertension #RV Dysfunction #Atrial Fibrillation with RVR Echo 03/19/24: EF 60 to 65%, grade I diastolic dysfunction, right ventricle size moderately enlarged, severely elevated pulmonary artery systolic pressures, severe tricuspid valve regurgitation  -Continuous cardiac monitoring -Maintain MAP >65 -Vasopressors as needed to maintain MAP goal ~ not requiring  -Diuresis as BP and renal function permits -Cardiology following, appreciate input  -Continue PO Amiodarone  and Eliquis   -Continue Metoprolol    #Acute Hypoxic Respiratory Failure #Possible Pneumonia ~ TREATED  #Metastatic Adenocarcinoma -Supplemental O2 as needed to maintain O2 sats >92% -BiPAP if needed (pt is DNR/DNI) -Follow intermittent Chest X-ray & ABG as needed -Bronchodilators & Pulmicort nebs -IV Steroids -ABX as above ~ completed course of Azithromycin  and Ceftriaxone   -Diuresis as BP and renal function permits -Pulmonary toilet as able      Pt is critically ill with multiorgan failure. Prognosis is guarded, high risk for further decompensation, cardiac arrest, and death.  Given current critical illness superimposed on multiple chronic co-morbidities and advanced age, overall long term prognosis is poor.  Pt is  DNR/DNI status.  Palliative Care following to assist with GOC discussions.   Best Practice (right click and Reselect all SmartList Selections daily)   Diet/type: Regular consistency (see orders) DVT prophylaxis: DOAC GI prophylaxis: N/A Lines: N/A Foley:  N/A Code Status:  DNR Last date of multidisciplinary goals of care discussion [N/A]  11/25: Pt and family updated at bedside on plan of care.  Labs   CBC: Recent Labs  Lab 03/25/24 0450 03/26/24 0755 03/27/24 0400 03/28/24 0520 03/29/24 0321  WBC  15.7* 13.7* 16.0* 17.5* 18.7*  HGB 8.0* 8.2* 8.7* 9.1* 9.1*  HCT 25.8* 27.2* 27.4* 29.4* 28.8*  MCV 77.9* 79.3* 77.4* 77.8* 76.2*  PLT 419* 435* 426* 448* 513*    Basic Metabolic Panel: Recent Labs  Lab 03/23/24 0425 03/24/24 0335 03/25/24 0450 03/26/24 0755 03/27/24 0400 03/28/24 0520 03/29/24 0159 03/29/24 0321  NA 127* 128* 128* 126* 126* 120* 118* 117*  K 4.3 4.0 4.2 4.1 4.3 4.2 4.4 4.3  CL 95* 92* 93* 91* 89* 86* 82* 81*  CO2 25 27 28 28 31 27 29 29   GLUCOSE 122* 120* 120* 110* 123* 116* 117* 123*  BUN 16 11 10 15 16 15 12 11   CREATININE 0.65 0.58 0.53 0.54 0.45 0.44 0.39* 0.38*  CALCIUM 8.2* 8.3* 8.4* 8.6* 8.6* 8.5* 8.6* 8.6*  MG 2.0 1.8  --   --   --   --   --   --   PHOS 2.3* 2.7 2.9 3.1  --   --   --   --  GFR: Estimated Creatinine Clearance: 54.4 mL/min (A) (by C-G formula based on SCr of 0.38 mg/dL (L)). Recent Labs  Lab 03/26/24 0755 03/27/24 0400 03/28/24 0520 03/29/24 0321  WBC 13.7* 16.0* 17.5* 18.7*    Liver Function Tests: Recent Labs  Lab 03/27/24 0400 03/29/24 0159  AST 22 25  ALT 15 18  ALKPHOS 200* 185*  BILITOT 0.3 0.3  PROT 6.1* 6.2*  ALBUMIN  2.8* 2.8*   No results for input(s): LIPASE, AMYLASE in the last 168 hours. No results for input(s): AMMONIA in the last 168 hours.  ABG    Component Value Date/Time   PHART 7.38 03/20/2024 1729   PCO2ART 46 03/20/2024 1729   PO2ART 97 03/20/2024 1729   HCO3 27.2 03/20/2024 1729   O2SAT 51.7 03/21/2024 0902     Coagulation Profile: No results for input(s): INR, PROTIME in the last 168 hours.  Cardiac Enzymes: No results for input(s): CKTOTAL, CKMB, CKMBINDEX, TROPONINI in the last 168 hours.  HbA1C: No results found for: HGBA1C  CBG: Recent Labs  Lab 03/29/24 1038  GLUCAP 154*    Review of Systems:   Positives in BOLD: Gen: Denies fever, chills, weight change, fatigue, night sweats HEENT: Denies blurred vision, double vision, hearing loss, tinnitus,  sinus congestion, rhinorrhea, sore throat, neck stiffness, dysphagia PULM: Denies shortness of breath, cough, sputum production, hemoptysis, wheezing CV: Denies chest pain, edema, orthopnea, paroxysmal nocturnal dyspnea, palpitations GI: Denies abdominal pain, nausea, vomiting, diarrhea, hematochezia, melena, constipation, change in bowel habits GU: Denies dysuria, hematuria, polyuria, oliguria, urethral discharge Endocrine: Denies hot or cold intolerance, polyuria, polyphagia or appetite change Derm: Denies rash, dry skin, scaling or peeling skin change Heme: Denies easy bruising, bleeding, bleeding gums Neuro: Denies headache, numbness, weakness, slurred speech, loss of memory or consciousness    Past Medical History:  She,  has a past medical history of Acid reflux, Anemia, Anesthesia complication, Arthritis, Difficult intubation, DVT (deep venous thrombosis) (HCC), Hypertension, and Sciatica.   Surgical History:   Past Surgical History:  Procedure Laterality Date   ABDOMINAL HYSTERECTOMY     CATARACT EXTRACTION W/ INTRAOCULAR LENS IMPLANT Bilateral 2007   eyes done within 1 month of each other.   IR IMAGING GUIDED PORT INSERTION  05/19/2023   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   TOTAL HIP ARTHROPLASTY Right 09/06/2019   Procedure: RIGHT TOTAL HIP ARTHROPLASTY ANTERIOR APPROACH;  Surgeon: Vernetta Lonni GRADE, MD;  Location: [facility] OR;  Service: Orthopedics;  Laterality: Right;   TOTAL HIP ARTHROPLASTY Left 07/31/2020   Procedure: LEFT TOTAL HIP ARTHROPLASTY ANTERIOR APPROACH;  Surgeon: Vernetta Lonni GRADE, MD;  Location: [facility] OR;  Service: Orthopedics;  Laterality: Left;   VIDEO BRONCHOSCOPY WITH ENDOBRONCHIAL ULTRASOUND N/A 10/06/2022   Procedure: VIDEO BRONCHOSCOPY WITH ENDOBRONCHIAL ULTRASOUND;  Surgeon: Parris Manna, MD;  Location: [facility] ORS;  Service: Thoracic;  Laterality: N/A;     Social History:   reports  that she quit smoking about 31 years ago. Her smoking use included cigarettes. She has never used smokeless tobacco. She reports current alcohol  use of about 4.0 standard drinks of alcohol  per week. She reports that she does not use drugs.   Family History:  Her family history includes Breast cancer (age of onset: 51) in her sister.   Allergies Allergies  Allergen Reactions   Ezetimibe Other (See Comments)    Body aches, and stiffness   Codeine Diarrhea   Cortisone Swelling    injections  Lovastatin Other (See Comments)    Muscle cramps     Home Medications  Prior to Admission medications   Medication Sig Start Date End Date Taking? Authorizing Provider  aspirin  EC 81 MG tablet Take 162 mg by mouth daily. Swallow whole.   Yes [provider]  Biotin 10 MG CAPS  08/07/22  Yes [provider]  cholecalciferol  (VITAMIN D3) 25 MCG (1000 UNIT) tablet Take 1,000 Units by mouth daily.   Yes [provider]  diphenoxylate -atropine  (LOMOTIL ) 2.5-0.025 MG tablet Take 1 tablet by mouth 4 (four) times daily as needed for diarrhea or loose stools. 11/09/23  Yes Jacobo Evalene PARAS, MD  DULoxetine  (CYMBALTA ) 30 MG capsule Take 1 capsule (30 mg total) by mouth daily. 03/07/24  Yes Borders, Fonda SAUNDERS, NP  FIBER PO Take 1 tablet by mouth daily.   Yes [provider]  furosemide  (LASIX ) 40 MG tablet Take 40 mg by mouth daily. 40 mg alternating 80 mg every other day   Yes [provider]  ibuprofen (ADVIL) 200 MG tablet Take 400 mg by mouth every 6 (six) hours as needed for mild pain or moderate pain.   Yes [provider]  Ibuprofen-diphenhydrAMINE  HCl (IBUPROFEN PM) 200-25 MG CAPS Take 2 tablets by mouth at bedtime as needed (sleep).   Yes [provider]  LORazepam (ATIVAN) 0.5 MG tablet Take by mouth. 09/04/23  Yes [provider]  losartan  (COZAAR ) 100 MG tablet Take 100 mg by mouth daily. 07/15/19  Yes [provider]  magic  mouthwash (multi-ingredient) oral suspension Swish and swallow 5-10 mLs 4 (four) times daily as needed. 02/26/24  Yes Jacobo Evalene PARAS, MD  magic mouthwash w/lidocaine  SOLN Take 5 mLs by mouth 4 (four) times daily. Suspension contains equal amounts of Maalox Extra Strength, nystatin , diphenhydramine  and lidocaine . 03/07/24  Yes Borders, Fonda SAUNDERS, NP  magnesium  oxide (MAG-OX) 400 (240 Mg) MG tablet Take 400 mg by mouth daily.   Yes [provider]  methylPREDNISolone  (MEDROL  DOSEPAK) 4 MG TBPK tablet Start with 6 tablets for 1st day and then reduce by one tablet daily until completed. 02/11/24  Yes Borders, Fonda SAUNDERS, NP  metoprolol  succinate (TOPROL -XL) 50 MG 24 hr tablet Take 50 mg by mouth daily. 06/24/19  Yes [provider]  omeprazole (PRILOSEC) 40 MG capsule Take 40 mg by mouth daily as needed.   Yes [provider]  ondansetron  (ZOFRAN ) 8 MG tablet Take 1 tablet (8 mg total) by mouth every 8 (eight) hours as needed for nausea or vomiting. 01/08/24  Yes Jacobo Evalene PARAS, MD  oxyCODONE  (OXY IR/ROXICODONE ) 5 MG immediate release tablet Take 1-2 tablets (5-10 mg total) by mouth every 4 (four) hours as needed for severe pain (pain score 7-10). 02/22/24  Yes Jacobo Evalene PARAS, MD  pregabalin  (LYRICA ) 25 MG capsule Take 1 capsule (25 mg total) by mouth 2 (two) times daily. 03/16/24  Yes Borders, Fonda SAUNDERS, NP  prochlorperazine  (COMPAZINE ) 10 MG tablet TAKE ONE TABLET BY MOUTH EVERY 6 HOURS AS NEEDED FOR NAUSEA / VOMITING 09/24/23  Yes Leonard, Alyson N, RPH-CPP  sodium chloride  1 g tablet Take 1 tablet (1 g total) by mouth 3 (three) times daily with meals. 03/16/24  Yes Jacobo Evalene PARAS, MD  sucralfate  (CARAFATE ) 1 g tablet Take 1 tablet (1 g total) by mouth 3 (three) times daily before meals. Dissolve tablet in 4 T warm water  swish and swallow 30 minutes before meals. 03/16/24 04/15/24 Yes Borders, Fonda SAUNDERS, NP  Tetrahydrozoline HCl (VISINE OP) Place 1 drop into both eyes  daily as needed (dry eyes).   Yes [provider]  vitamin B-12 (CYANOCOBALAMIN ) 1000 MCG tablet Take 1,000 mcg by mouth daily.   Yes [provider]  OVER THE COUNTER MEDICATION Take 1 capsule by mouth daily. CBD gummie    [provider]     Critical care time: 50 minutes     Inge Lecher, AGACNP-BC [facility] Prefer epic messenger for cross cover needs If after hours, please call E-link

## 2024-03-29 NOTE — Progress Notes (Signed)
 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  TTE with RV strain currently on heparin  drip.  Course complicated by A-fib with RVR requiring amiodarone  drip. Initially required levophed  gtt which has been wean off. Cardiology following. Hospital course as below  11/19: transfer to PCU, wound care c/s 11/20: change to PO Amiodarone  11/21: Lasix  80 mg IV once, change Heparin  to Eliquis , transfer to med-surg. DC tele 11/22: Anemia panel, chest x-ray.  Continue to wean oxygen.  Discontinue Foley-family refused 11/23: Setting up DME's and home health for discharge home next 1 to 2 days.  Given 1 dose of Toradol  for foot pain, foley removed 11/24: Na 120 so one time dose of Tolvaptan  ordered 11/25: Sodium 117, transferred to stepdown for 3% saline, nephrology, palliative care and PCCM consult   Subjective:  Patient has been hallucinating per brother who is at bedside.  Family agreeable with transfer to ICU/stepdown  Objective: Vitals:   03/29/24 0830 03/29/24 1045 03/29/24 1100 03/29/24 1200  BP: (!) 124/99  (!) 167/75 (!) 154/80  Pulse: 77 81 70 71  Resp: 19 18 17 14   Temp: 98.4 F (36.9 C) 98.1 F (36.7 C)    TempSrc: Axillary Axillary    SpO2: 96% 92%    Weight:  82.8 kg    Height:        Intake/Output Summary (Last 24 hours) at 03/29/2024 1748 Last data filed at 03/29/2024 1607 Gross per 24 hour  Intake 29.82 ml  Output  --  Net 29.82 ml    Filed Weights   03/27/24 0703 03/28/24 0700 03/29/24 1045  Weight: 79.8 kg 83.4 kg 82.8 kg    Examination: General: Acute on chronically-ill appearing female HENT: Supple, no JVD Lungs: Clear to auscultation bilaterally Cardiovascular: Irregular irregular, no m/r/g, trace bilateral lower extremity edema  Abdomen: +BS x4, non distended, non tender  Extremities: Right foot/ankle tenderness Neuro: Lethargic, follows some verbal commands  Assessment & Plan:   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  Nephrology consult.  She is started on 3% hypertonic saline with close monitoring in stepdown  Acute Metabolic encephalopathy.  Now hallucinating - Likely due to severe hyponatremia - Avoid sedative medications  Acute segmental PE - US  shows nonocclusive thrombus in right posterior tibial and peroneal vein - IR consulted for acute segmental PE clot burden minimal and thrombolysis contraindicated due to brain metastasis   - Continue Eliquis .  Started on 11/21  Community Acquired Pneumonia - RVP negative, MRSA nares negative - Blood culture 11/14 NGTD - Completed Abx for 5 days  Acute hypoxic respiratory failure  - Suspect likely due to acute segmental PE, possible pneumonia, A fib, volume overload - Initially required BiPAP to HFNC 9 liter -> 2 liters - Enouraged use of incentive spirometry  Atrial fibrillation with RVR Cardiogenic  shock, pulmonary HTN - Echo 03/19/24: EF 60 to 65%, grade I diastolic dysfunction, right ventricle size moderately enlarged, severely elevated pulmonary artery systolic pressures, severe tricuspid valve regurgitation - continue p.o. amiodarone  load with 400 mg twice daily for 7 days followed by 200 mg daily. - continue metoprolol  to 25 mg twice daily.   Metastatic  adenocarcinoma  - Seen by oncology, appreciate recs - New severe T6 compression fracture - XR hip increased lucency at the acetabulum on the left, likely related to metastatic disease.  Possibility of superimposed fracture - Oncology and Palliative care following - overall poor prognosis.  She is DNR  AKI - resolved - Monitor Cr  Generalized Weakness - PT/OT recommends SNF. Patient declines and would like to go home with [facility] - aide as well as PT, OT, RN   GOC Palliative care seen. Overall very poor prognosis. Family still in denial. - high risk for cardiorespi arrest, multi organ failure and death.   DVT prophylaxis: Eliquis    Code Status: Limited: Do not attempt resuscitation (DNR) -DNR-LIMITED -Do Not Intubate/DNI  Disposition:  possible DC in next 3-4 days depending on clinical condition and sodium.  Consultants:  Treatment Team:  Consulting Physician: Pccm, Fenton, MDPCCM Cardiology Onco Nephrology  Antimicrobials:  Anti-infectives (From admission, onward)    Start     Dose/Rate Route Frequency Ordered Stop   03/22/24 1815  azithromycin  (ZITHROMAX ) 500 mg in sodium chloride  0.9 % 250 mL IVPB        500 mg 250 mL/hr over 60 Minutes Intravenous Every 24 hours 03/22/24 1256 03/22/24 1833   03/22/24 1600  cefTRIAXone  (ROCEPHIN ) 2 g in sodium chloride  0.9 % 100 mL IVPB        2 g 200 mL/hr over 30 Minutes Intravenous Every 24 hours 03/22/24 1449 03/24/24 1507   03/20/24 0600  vancomycin  (VANCOREADY) IVPB 750 mg/150 mL  Status:  Discontinued       Placed in Followed by Linked Group   750 mg 150 mL/hr over 60 Minutes Intravenous Every 24 hours 03/19/24 0530 03/19/24 1049   03/19/24 1815  azithromycin  (ZITHROMAX ) 500 mg in sodium chloride  0.9 % 250 mL IVPB  Status:  Discontinued        500 mg 250 mL/hr over 60 Minutes Intravenous Every 24 hours 03/19/24 1716 03/22/24 1256   03/19/24 1400  cefTRIAXone  (ROCEPHIN ) 2 g in sodium chloride  0.9 % 100 mL IVPB        2 g 200  mL/hr over 30 Minutes Intravenous Every 24 hours 03/19/24 1049 03/22/24 2359   03/19/24 0800  ceFEPIme  (MAXIPIME ) 2 g in sodium chloride  0.9 % 100 mL IVPB  Status:  Discontinued        2 g 200 mL/hr over 30 Minutes Intravenous Every 12 hours 03/19/24 0523 03/19/24 1049   03/19/24 0615  vancomycin  (VANCOREADY) IVPB 1750 mg/350 mL       Placed in Followed by Linked Group   1,750 mg 175 mL/hr over 120 Minutes Intravenous  Once 03/19/24 0530 03/19/24 0812   03/18/24 1945  cefTRIAXone  (ROCEPHIN ) 2 g in sodium chloride  0.9 % 100 mL IVPB        2 g 200 mL/hr over 30 Minutes Intravenous  Once 03/18/24 1930 03/18/24 2202   03/18/24 1945  azithromycin  (ZITHROMAX ) 500 mg in sodium chloride  0.9 % 250 mL IVPB        500 mg 250 mL/hr over 60 Minutes Intravenous  Once 03/18/24 1930 03/19/24 0001  Data Reviewed: I have personally reviewed following labs and imaging studies CBC: Recent Labs  Lab 03/25/24 0450 03/26/24 0755 03/27/24 0400 03/28/24 0520 03/29/24 0321  WBC 15.7* 13.7* 16.0* 17.5* 18.7*  HGB 8.0* 8.2* 8.7* 9.1* 9.1*  HCT 25.8* 27.2* 27.4* 29.4* 28.8*  MCV 77.9* 79.3* 77.4* 77.8* 76.2*  PLT 419* 435* 426* 448* 513*   Basic Metabolic Panel: Recent Labs  Lab 03/23/24 0425 03/24/24 0335 03/25/24 0450 03/26/24 0755 03/27/24 0400 03/28/24 0520 03/29/24 0159 03/29/24 0321 03/29/24 1407  NA 127* 128* 128* 126* 126* 120* 118* 117* 117*  K 4.3 4.0 4.2 4.1 4.3 4.2 4.4 4.3  --   CL 95* 92* 93* 91* 89* 86* 82* 81*  --   CO2 25 27 28 28 31 27 29 29   --   GLUCOSE 122* 120* 120* 110* 123* 116* 117* 123*  --   BUN 16 11 10 15 16 15 12 11   --   CREATININE 0.65 0.58 0.53 0.54 0.45 0.44 0.39* 0.38*  --   CALCIUM 8.2* 8.3* 8.4* 8.6* 8.6* 8.5* 8.6* 8.6*  --   MG 2.0 1.8  --   --   --   --   --   --   --   PHOS 2.3* 2.7 2.9 3.1  --   --   --   --   --    GFR: Estimated Creatinine Clearance: 54.4 mL/min (A) (by C-G formula based on SCr of 0.38 mg/dL (L)). Liver Function  Tests: Recent Labs  Lab 03/27/24 0400 03/29/24 0159  AST 22 25  ALT 15 18  ALKPHOS 200* 185*  BILITOT 0.3 0.3  PROT 6.1* 6.2*  ALBUMIN  2.8* 2.8*   CBG: Recent Labs  Lab 03/29/24 1038  GLUCAP 154*     Recent Results (from the past 240 hours)  Respiratory (~20 pathogens) panel by PCR     Status: None   Collection Time: 03/21/24  9:37 AM   Specimen: Nasopharyngeal Swab; Respiratory  Result Value Ref Range Status   Adenovirus NOT DETECTED NOT DETECTED Final   Coronavirus 229E NOT DETECTED NOT DETECTED Final    Comment: (NOTE) The Coronavirus on the Respiratory Panel, DOES NOT test for the novel  Coronavirus (2019 nCoV)    Coronavirus HKU1 NOT DETECTED NOT DETECTED Final   Coronavirus NL63 NOT DETECTED NOT DETECTED Final   Coronavirus OC43 NOT DETECTED NOT DETECTED Final   Metapneumovirus NOT DETECTED NOT DETECTED Final   Rhinovirus / Enterovirus NOT DETECTED NOT DETECTED Final   Influenza A NOT DETECTED NOT DETECTED Final   Influenza B NOT DETECTED NOT DETECTED Final   Parainfluenza Virus 1 NOT DETECTED NOT DETECTED Final   Parainfluenza Virus 2 NOT DETECTED NOT DETECTED Final   Parainfluenza Virus 3 NOT DETECTED NOT DETECTED Final   Parainfluenza Virus 4 NOT DETECTED NOT DETECTED Final   Respiratory Syncytial Virus NOT DETECTED NOT DETECTED Final   Bordetella pertussis NOT DETECTED NOT DETECTED Final   Bordetella Parapertussis NOT DETECTED NOT DETECTED Final   Chlamydophila pneumoniae NOT DETECTED NOT DETECTED Final   Mycoplasma pneumoniae NOT DETECTED NOT DETECTED Final    Comment: Performed at [facility], [address], [place], [place] [address]     Radiology Studies: DG Chest Port 1 View Result Date: 03/29/2024 EXAM: 1 VIEW(S) XRAY OF THE CHEST 03/29/2024 04:00:00 PM COMPARISON: 03/26/2024 CLINICAL HISTORY: Shortness of breath FINDINGS: LINES, TUBES AND DEVICES: Right Port-A-Cath in place, stable. LUNGS AND PLEURA: Low lung volumes. Left  retrocardiac  atelectasis/consolidation, stable. Mild diffuse interstitial prominence. Small left pleural effusion. No pneumothorax. HEART AND MEDIASTINUM: No acute abnormality of the cardiac and mediastinal silhouettes. BONES AND SOFT TISSUES: No acute osseous abnormality. IMPRESSION: 1. Left retrocardiac atelectasis/consolidation, stable. 2. Small left pleural effusion. 3. Mild diffuse interstitial prominence. Electronically signed by: Norleen Boxer MD 03/29/2024 04:34 PM EST RP Workstation: [id number]      Scheduled Meds:  ALPRAZolam   0.5 mg Oral QHS   amiodarone   400 mg Oral BID   Followed by   NOREEN ON 03/31/2024] amiodarone   200 mg Oral Daily   apixaban   10 mg Oral BID   Followed by   NOREEN ON 04/01/2024] apixaban   5 mg Oral BID   vitamin C   500 mg Oral BID   Chlorhexidine  Gluconate Cloth  6 each Topical Daily   dextromethorphan -guaiFENesin   1 tablet Oral BID   DULoxetine   30 mg Oral Daily   magic mouthwash w/lidocaine   5 mL Oral QID   metoprolol  tartrate  25 mg Oral BID   Ensure Max Protein  11 oz Oral BID   Continuous Infusions:  sodium chloride  (hypertonic) 30 mL/hr at 03/29/24 1607    Time spent (critical care): 35 mins   LOS: 11 days  MDM: Patient is high risk for one or more organ failure.  They necessitate ongoing hospitalization for continued IV therapies and subsequent lab monitoring. Total time spent interpreting labs and vitals, reviewing the medical record, coordinating care amongst consultants and care team members, directly assessing and discussing care with the patient and/or family Cresencio Fairly, MD [facility]  To contact the attending physician between 7A-7P please use Epic Chat. To contact the covering physician during after hours 7P-7A, please review Amion.  03/29/2024, 5:48 PM   *This document has been created with the assistance of dictation software. Please excuse typographical errors. *

## 2024-03-29 NOTE — Progress Notes (Signed)
 Palliative Medicine [facility] [phone] [phone]   Name: Hannah Yu Date: 03/29/2024 MRN: [id number]  DOB: Feb 03, 1946  Patient Care Team: Epifanio Alm SQUIBB, MD as PCP - General (Infectious Diseases) Verdene Gills, RN as Oncology Nurse Navigator Lenn Aran, MD as Consulting Physician (Radiation Oncology) Jacobo Evalene PARAS, MD as Consulting Physician (Oncology)    REASON FOR CONSULTATION: Hannah Yu is a 78 y.o. female with multiple medical problems including stage IVb adenocarcinoma of the lung on treatment with CarboTaxol plus Bev.  Patient mid to the hospital with respiratory failure.  Found to have PE and right heart dysfunction on echo. Palliative care was consulted to address goals.   CODE STATUS: DNR  PAST MEDICAL HISTORY: Past Medical History:  Diagnosis Date   Acid reflux    Anemia    Anesthesia complication    Tachycardia previously, now on metoprolol    Arthritis    09/02/2019: per patient have it real bad in both hands and back   Difficult intubation    had to terminate intubation unable to advance tube for cholecystectomy 2017?   DVT (deep venous thrombosis) (HCC)    leg   Hypertension    Sciatica    09/02/2019: has had it for about 3 years    PAST SURGICAL HISTORY:  Past Surgical History:  Procedure Laterality Date   ABDOMINAL HYSTERECTOMY     CATARACT EXTRACTION W/ INTRAOCULAR LENS IMPLANT Bilateral 2007   eyes done within 1 month of each other.   IR IMAGING GUIDED PORT INSERTION  05/19/2023   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   TOTAL HIP ARTHROPLASTY Right 09/06/2019   Procedure: RIGHT TOTAL HIP ARTHROPLASTY ANTERIOR APPROACH;  Surgeon: Vernetta Lonni GRADE, MD;  Location: [facility] OR;  Service: Orthopedics;  Laterality: Right;   TOTAL HIP ARTHROPLASTY Left 07/31/2020   Procedure: LEFT TOTAL HIP  ARTHROPLASTY ANTERIOR APPROACH;  Surgeon: Vernetta Lonni GRADE, MD;  Location: [facility] OR;  Service: Orthopedics;  Laterality: Left;   VIDEO BRONCHOSCOPY WITH ENDOBRONCHIAL ULTRASOUND N/A 10/06/2022   Procedure: VIDEO BRONCHOSCOPY WITH ENDOBRONCHIAL ULTRASOUND;  Surgeon: Parris Manna, MD;  Location: [facility] ORS;  Service: Thoracic;  Laterality: N/A;    HEMATOLOGY/ONCOLOGY HISTORY:  Oncology History  Adenocarcinoma of left lung (HCC)  03/19/2023 Initial Diagnosis   Adenocarcinoma of left lung (HCC)   03/19/2023 Cancer Staging   Staging form: Lung, AJCC 8th Edition - Clinical stage from 03/19/2023: Stage IVB (cTX, cN2, cM1c) - Signed by Jacobo Evalene PARAS, MD on 03/19/2023 Stage prefix: Initial diagnosis   05/07/2023 - 07/16/2023 Chemotherapy   Patient is on Treatment Plan : LUNG NSCLC Pembrolizumab  (200) q21d     02/03/2024 -  Chemotherapy   Patient is on Treatment Plan : LUNG NSCLC Carboplatin  + Paclitaxel  + Bevacizumab  q21d       ALLERGIES:  is allergic to ezetimibe, codeine, cortisone, and lovastatin.  MEDICATIONS:  Current Facility-Administered Medications  Medication Dose Route Frequency Provider Last Rate Last Admin   acetaminophen  (TYLENOL ) tablet 650 mg  650 mg Oral Q6H PRN Maree Hue, MD   650 mg at 03/27/24 2143   albuterol  (PROVENTIL ) (2.5 MG/3ML) 0.083% nebulizer solution 2.5 mg  2.5 mg Nebulization Q4H PRN Maree Hue, MD       ALPRAZolam  (XANAX ) tablet 0.5 mg  0.5 mg Oral QHS Nelson, Dana G, NP   0.5 mg at 03/28/24 2123   alum & mag  hydroxide-simeth (MAALOX/MYLANTA) 200-200-20 MG/5ML suspension 15 mL  15 mL Oral Q6H PRN Nelson, Dana G, NP   15 mL at 03/22/24 0945   amiodarone  (PACERONE ) tablet 400 mg  400 mg Oral BID Hudson, Caralyn, PA-C   400 mg at 03/29/24 1014   Followed by   NOREEN ON 03/31/2024] amiodarone  (PACERONE ) tablet 200 mg  200 mg Oral Daily Hudson, Caralyn, PA-C       apixaban  (ELIQUIS ) tablet 10 mg  10 mg Oral BID Maree Hue, MD   10 mg at 03/29/24 1015    Followed by   NOREEN ON 04/01/2024] apixaban  (ELIQUIS ) tablet 5 mg  5 mg Oral BID Shah, Vipul, MD       artificial tears ophthalmic solution 1 drop  1 drop Both Eyes PRN Maree Hue, MD       ascorbic acid  (VITAMIN C ) tablet 500 mg  500 mg Oral BID Shah, Vipul, MD   500 mg at 03/29/24 1015   bisacodyl  (DULCOLAX) suppository 10 mg  10 mg Rectal Daily PRN Bousman, Karlie, PA-C       Chlorhexidine  Gluconate Cloth 2 % PADS 6 each  6 each Topical Daily Kathrene Almarie Bake, NP   6 each at 03/29/24 1016   dextromethorphan -guaiFENesin  (MUCINEX  DM) 30-600 MG per 12 hr tablet 1 tablet  1 tablet Oral BID Ponnala, Shruthi, MD   1 tablet at 03/29/24 1014   docusate sodium  (COLACE) capsule 100 mg  100 mg Oral BID PRN Ouma, Elizabeth Achieng, NP   100 mg at 03/25/24 1640   DULoxetine  (CYMBALTA ) DR capsule 30 mg  30 mg Oral Daily Isadora Hose, MD   30 mg at 03/29/24 1016   lip balm (CARMEX) ointment 1 Application  1 Application Topical PRN Maree Hue, MD   1 Application at 03/26/24 2216   magic mouthwash w/lidocaine   5 mL Oral QID Isadora Hose, MD   5 mL at 03/29/24 1012   menthol  (CEPACOL) lozenge 3 mg  1 lozenge Oral PRN Ponnala, Shruthi, MD   3 mg at 03/22/24 1836   metoprolol  tartrate (LOPRESSOR ) tablet 25 mg  25 mg Oral BID Hudson, Caralyn, PA-C   25 mg at 03/29/24 1014   ondansetron  (ZOFRAN ) injection 4 mg  4 mg Intravenous Q6H PRN Maree Hue, MD       Oral care mouth rinse  15 mL Mouth Rinse PRN Ponnala, Shruthi, MD   15 mL at 03/22/24 2016   polyethylene glycol (MIRALAX  / GLYCOLAX ) packet 17 g  17 g Oral Daily PRN Kathrene Almarie Bake, NP   17 g at 03/25/24 1640   protein supplement (ENSURE MAX) liquid  11 oz Oral BID Maree Hue, MD   11 oz at 03/28/24 2123   simethicone  (MYLICON) chewable tablet 80 mg  80 mg Oral QID PRN Bousman, Karlie, PA-C   80 mg at 03/27/24 0018   sodium chloride  (hypertonic) 3 % solution   Intravenous Continuous Breeze, Faith, NP        VITAL SIGNS: BP (!)  154/80   Pulse 71   Temp 98.1 F (36.7 C) (Axillary)   Resp 14   Ht 5' (1.524 m)   Wt 182 lb 8.7 oz (82.8 kg)   SpO2 92%   BMI 35.65 kg/m  Filed Weights   03/27/24 0703 03/28/24 0700 03/29/24 1045  Weight: 175 lb 14.8 oz (79.8 kg) 183 lb 13.8 oz (83.4 kg) 182 lb 8.7 oz (82.8 kg)    Estimated body mass index is 35.65 kg/m  as calculated from the following:   Height as of this encounter: 5' (1.524 m).   Weight as of this encounter: 182 lb 8.7 oz (82.8 kg).  LABS: CBC:    Component Value Date/Time   WBC 18.7 (H) 03/29/2024 0321   HGB 9.1 (L) 03/29/2024 0321   HGB 10.8 (L) 03/16/2024 0830   HCT 28.8 (L) 03/29/2024 0321   PLT 513 (H) 03/29/2024 0321   PLT 411 (H) 03/16/2024 0830   MCV 76.2 (L) 03/29/2024 0321   NEUTROABS 24.0 (H) 03/18/2024 1850   LYMPHSABS 0.6 (L) 03/18/2024 1850   MONOABS 1.6 (H) 03/18/2024 1850   EOSABS 0.0 03/18/2024 1850   BASOSABS 0.1 03/18/2024 1850   Comprehensive Metabolic Panel:    Component Value Date/Time   NA 117 (LL) 03/29/2024 0321   K 4.3 03/29/2024 0321   CL 81 (L) 03/29/2024 0321   CO2 29 03/29/2024 0321   BUN 11 03/29/2024 0321   CREATININE 0.38 (L) 03/29/2024 0321   CREATININE 0.69 03/16/2024 0830   GLUCOSE 123 (H) 03/29/2024 0321   CALCIUM 8.6 (L) 03/29/2024 0321   AST 25 03/29/2024 0159   AST 24 03/16/2024 0830   ALT 18 03/29/2024 0159   ALT 19 03/16/2024 0830   ALKPHOS 185 (H) 03/29/2024 0159   BILITOT 0.3 03/29/2024 0159   BILITOT 0.4 03/16/2024 0830   PROT 6.2 (L) 03/29/2024 0159   ALBUMIN  2.8 (L) 03/29/2024 0159    RADIOGRAPHIC STUDIES: DG Chest 2 View Result Date: 03/26/2024 EXAM: 2 VIEW(S) XRAY OF THE CHEST 03/26/2024 08:12:00 AM COMPARISON: 03/22/2024 CLINICAL HISTORY: Hypoxia. FINDINGS: LINES, TUBES AND DEVICES: Right internal jugular Port-A-Cath in place with tip at cavoatrial junction. Soft tissue anchors over right humeral head. LUNGS AND PLEURA: Small left pleural effusion, increased, with worsening aeration to  the left mid and left lower lung. Mild pulmonary edema. Increased hazy opacity in left upper lung. No pneumothorax. HEART AND MEDIASTINUM: Mild cardiomegaly, stable. No acute abnormality of the mediastinal silhouette. BONES AND SOFT TISSUES: No acute osseous abnormality. IMPRESSION: 1. Increased left pleural effusion with worsening aeration to the left lower lobe. 2. Increased hazy lung opacities within the left upper lobe, which may reflect asymmetric edema versus infection. Electronically signed by: Waddell Calk MD 03/26/2024 09:29 AM EST RP Workstation: [id number]   DG Chest Port 1 View Result Date: 03/22/2024 EXAM: 1 VIEW(S) XRAY OF THE CHEST 03/22/2024 06:02:00 PM COMPARISON: 03/22/2024 CLINICAL HISTORY: Acute hypoxemic respiratory failure (HCC) [id number] FINDINGS: LINES, TUBES AND DEVICES: Stable right chest Port-A-Cath. LUNGS AND PLEURA: Improved perihilar interstitial pulmonary infiltrate. Persistent retrocardiac consolidation in keeping with atelectasis or infiltrate within this region. Small left pleural effusion. No pleural effusion on the right. No pneumothorax. HEART AND MEDIASTINUM: Stable cardiomegaly. BONES AND SOFT TISSUES: No acute osseous abnormality. IMPRESSION: 1. Improved perihilar interstitial pulmonary infiltrate and small left pleural effusion, suggesting changes of mild cardiogenic failure. 2. Persistent retrocardiac consolidation, in keeping with atelectasis or infiltrate within this region. Electronically signed by: Dorethia Molt MD 03/22/2024 11:29 PM EST RP Workstation: [id number]   DG Chest Port 1 View Result Date: 03/22/2024 EXAM: 1 VIEW(S) XRAY OF THE CHEST 03/22/2024 05:06:00 AM COMPARISON: 03/21/2024. CLINICAL HISTORY: Acute hypoxic respiratory failure (HCC) [id number] FINDINGS: LINES, TUBES AND DEVICES: Right port a cath tip at superior cavo / atrial junction. LUNGS AND PLEURA: Moderate interstitial edema, new or increased. Similar left base airspace disease. Suspect a similar  tiny left pleural effusion. No pneumothorax. HEART AND MEDIASTINUM: Moderate cardiomegaly. BONES AND SOFT TISSUES:  No acute osseous abnormality. IMPRESSION: 1. Cardiomegaly with new or increased moderate congestive heart failure. 2. Similar small left pleural effusion and adjacent airspace disease, which could represent atelectasis or infection. Electronically signed by: Rockey Kilts MD 03/22/2024 10:41 AM EST RP Workstation: [id number]   DG Chest Port 1 View Result Date: 03/21/2024 EXAM: 1 VIEW(S) XRAY OF THE CHEST 03/21/2024 06:56:42 PM COMPARISON: 03/21/2024 CLINICAL HISTORY: Acute respiratory failure (HCC) FINDINGS: LINES, TUBES AND DEVICES: Right chest Port-A-Cath stable in place. LUNGS AND PLEURA: Stable retrocardiac opacification. Stable small left pleural effusion. No pneumothorax. HEART AND MEDIASTINUM: Stable cardiomegaly. Aortic arch atherosclerosis. BONES AND SOFT TISSUES: No acute osseous abnormality. IMPRESSION: 1. Stable retrocardiac opacification and small left pleural effusion. 2. Stable cardiomegaly and aortic arch atherosclerosis. 3. Right chest Port-A-Cath stable in place. Electronically signed by: Dorethia Molt MD 03/21/2024 08:22 PM EST RP Workstation: [id number]   DG Chest Port 1 View Result Date: 03/21/2024 EXAM: 1 VIEW(S) XRAY OF THE CHEST 03/21/2024 09:05:00 AM COMPARISON: 03/18/2024 CLINICAL HISTORY: Acute hypoxic respiratory failure (HCC) [id number]. FINDINGS: LINES, TUBES AND DEVICES: Stable right IJ port catheter to the cavoatrial junction. LUNGS AND PLEURA: Worsening perihilar opacities. Worsening and left retrocardiac consolidation/atelectasis. Worsening linear perihilar opacities. Probable small left pleural effusion. No pneumothorax. HEART AND MEDIASTINUM: Aortic atherosclerosis (ICD-10: I70.0). BONES AND SOFT TISSUES: No acute osseous abnormality. IMPRESSION: 1. Worsening perihilar opacities and left retrocardiac consolidation/atelectasis. 2. Probable small left pleural  effusion. Electronically signed by: Katheleen Faes MD 03/21/2024 09:59 AM EST RP Workstation: [id number]   DG HIP UNILAT WITH PELVIS 2-3 VIEWS LEFT Result Date: 03/20/2024 CLINICAL DATA:  Pain with recent fall. EXAM: DG HIP (WITH OR WITHOUT PELVIS) 2-3V LEFT COMPARISON:  12/17/2020, 12/21/2023. FINDINGS: Increased lucency is noted at the acetabulum on the left, concerning for metastatic disease and seen on prior PET-CT. There are cortical irregularities involving the medial aspect of the pelvic rim at the acetabulum and inferior pubic ramus, which may be due to metastatic disease. The possibility of superimposed in fracture cannot be excluded. 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 There degenerative changes in the lower lumbar spine. IMPRESSION: 1. Increased lucency a at the acetabulum on the left, likely related to known metastatic disease. Cortical irregularities are also seen along the pelvic rim at the acetabulum on the left and inferior pubic ramus, raising the possibility for superimposed fracture. 2. Total hip arthroplasty changes bilaterally. Electronically Signed   By: Leita Birmingham M.D.   On: 03/20/2024 17:05   ECHOCARDIOGRAM COMPLETE Result Date: 03/19/2024    ECHOCARDIOGRAM REPORT   Patient Name:   NOELY KUHNLE Date of Exam: 03/19/2024 Medical Rec #:  [id number]           Height:       60.0 in Accession #:    [id number]          Weight:       172.4 lb Date of Birth:  10-29-1945            BSA:          1.752 m Patient Age:    78 years            BP:           109/78 mmHg Patient Gender: F                   HR:           94 bpm. Exam Location:  [facility] Procedure: 2D Echo, Color Doppler and  Cardiac Doppler (Both Spectral and Color            Flow Doppler were utilized during procedure). STAT ECHO Indications:     Dyspnea R06.00  History:         Patient has prior history of Echocardiogram examinations, most                  recent 09/09/2019.  Sonographer:     Thedora Louder RDCS,  FASE Referring Phys:  [id number] KHABIB DGAYLI Diagnosing Phys: Annabella Scarce MD IMPRESSIONS  1. Compared with the echo in 2021, right ventricular dilation and dysfunction are new. Consistent with known finding of pulmonary embolis.  2. Left ventricular ejection fraction, by estimation, is 60 to 65%. The left ventricle has normal function. The left ventricle has no regional wall motion abnormalities. Left ventricular diastolic parameters are consistent with Grade I diastolic dysfunction (impaired relaxation).  3. Right ventricular systolic function is moderately reduced. The right ventricular size is moderately enlarged. There is severely elevated pulmonary artery systolic pressure.  4. Right atrial size was severely dilated.  5. The mitral valve is normal in structure. No evidence of mitral valve regurgitation. No evidence of mitral stenosis.  6. 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  7. The aortic valve is tricuspid. Aortic valve regurgitation is not visualized. No aortic stenosis is present.  8. The inferior vena cava is dilated in size with <50% respiratory variability, suggesting right atrial pressure of 15 mmHg. FINDINGS  Left Ventricle: Left ventricular ejection fraction, by estimation, is 60 to 65%. The left ventricle has normal function. The left ventricle has no regional wall motion abnormalities. The left ventricular internal cavity size was normal in size. There is  no left ventricular hypertrophy. Left ventricular diastolic parameters are consistent with Grade I diastolic dysfunction (impaired relaxation). Normal left ventricular filling pressure. Right Ventricle: The right ventricular size is moderately enlarged. No increase in right ventricular wall thickness. Right ventricular systolic function is moderately reduced. There is severely elevated pulmonary artery systolic pressure. The tricuspid regurgitant velocity is 3.46 m/s, and with an assumed right atrial pressure of 15 mmHg, the estimated  right ventricular systolic pressure is 62.9 mmHg. Left Atrium: Left atrial size was normal in size. Right Atrium: Right atrial size was severely dilated. Pericardium: There is no evidence of pericardial effusion. Mitral Valve: The mitral valve is normal in structure. No evidence of mitral valve regurgitation. No evidence of mitral valve stenosis. Tricuspid Valve: The tricuspid valve is normal in structure. 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 No evidence of tricuspid stenosis. Aortic Valve: The aortic valve is tricuspid. Aortic valve regurgitation is not visualized. No aortic stenosis is present. Aortic valve peak gradient measures 6.9 mmHg. Pulmonic Valve: The pulmonic valve was normal in structure. Pulmonic valve regurgitation is not visualized. No evidence of pulmonic stenosis. Aorta: The aortic root is normal in size and structure. Venous: The inferior vena cava is dilated in size with less than 50% respiratory variability, suggesting right atrial pressure of 15 mmHg. IAS/Shunts: No atrial level shunt detected by color flow Doppler.  LEFT VENTRICLE PLAX 2D LVIDd:         2.90 cm   Diastology LVIDs:         2.10 cm   LV e' medial:    10.00 cm/s LV IVS:        1.00 cm   LV E/e' medial:  6.9 LVOT diam:     1.90 cm   LV e' lateral:   13.60  cm/s LV SV:         30        LV E/e' lateral: 5.1 LV SV Index:   17 LVOT Area:     2.84 cm  RIGHT VENTRICLE RV Basal diam:  3.40 cm RV S prime:     9.46 cm/s TAPSE (M-mode): 1.6 cm LEFT ATRIUM           Index        RIGHT ATRIUM           Index LA Vol (A4C): 40.2 ml 22.94 ml/m  RA Area:     23.80 cm                                    RA Volume:   75.20 ml  42.91 ml/m  AORTIC VALVE                 PULMONIC VALVE AV Area (Vmax): 1.31 cm     PV Vmax:        0.76 m/s AV Vmax:        131.00 cm/s  PV Peak grad:   2.3 mmHg AV Peak Grad:   6.9 mmHg     RVOT Peak grad: 1 mmHg LVOT Vmax:      60.70 cm/s LVOT Vmean:     38.700 cm/s LVOT VTI:       0.105 m  AORTA Ao Root diam: 3.00  cm Ao Asc diam:  3.30 cm MITRAL VALVE               TRICUSPID VALVE MV Area (PHT): 2.24 cm    TR Peak grad:   47.9 mmHg MV Decel Time: 338 msec    TR Vmax:        346.00 cm/s MV E velocity: 68.70 cm/s MV A velocity: 98.00 cm/s  SHUNTS MV E/A ratio:  0.70        Systemic VTI:  0.10 m                            Systemic Diam: 1.90 cm Annabella Scarce MD Electronically signed by Annabella Scarce MD Signature Date/Time: 03/19/2024/11:15:10 AM    Final (Updated)    US  Venous Img Lower Bilateral (DVT) Result Date: 03/19/2024 CLINICAL DATA:  Pulmonary embolism. EXAM: BILATERAL LOWER EXTREMITY VENOUS DOPPLER ULTRASOUND TECHNIQUE: Gray-scale sonography with graded compression, as well as color Doppler and duplex ultrasound were performed to evaluate the lower extremity deep venous systems from the level of the common femoral vein and including the common femoral, femoral, profunda femoral, popliteal and calf veins including the posterior tibial, peroneal and gastrocnemius veins when visible. The superficial great saphenous vein was also interrogated. Spectral Doppler was utilized to evaluate flow at rest and with distal augmentation maneuvers in the common femoral, femoral and popliteal veins. COMPARISON:  None Available. FINDINGS: RIGHT LOWER EXTREMITY Common Femoral Vein: No evidence of thrombus. Normal compressibility, respiratory phasicity and response to augmentation. Saphenofemoral Junction: No evidence of thrombus. Normal compressibility and flow on color Doppler imaging. Profunda Femoral Vein: No evidence of thrombus. Normal compressibility and flow on color Doppler imaging. Femoral Vein: No evidence of thrombus. Normal compressibility, respiratory phasicity and response to augmentation. Popliteal Vein: No evidence of thrombus. Normal compressibility, respiratory phasicity and response to augmentation. Calf Veins: Suggestion of nonocclusive thrombus in the visualized right posterior tibial and  peroneal veins.  Superficial Great Saphenous Vein: No evidence of thrombus. Normal compressibility. Venous Reflux:  None. Other Findings: No evidence of superficial thrombophlebitis or abnormal fluid collection. LEFT LOWER EXTREMITY Common Femoral Vein: No evidence of thrombus. Normal compressibility, respiratory phasicity and response to augmentation. Saphenofemoral Junction: No evidence of thrombus. Normal compressibility and flow on color Doppler imaging. Profunda Femoral Vein: No evidence of thrombus. Normal compressibility and flow on color Doppler imaging. Femoral Vein: No evidence of thrombus. Normal compressibility, respiratory phasicity and response to augmentation. Popliteal Vein: No evidence of thrombus. Normal compressibility, respiratory phasicity and response to augmentation. Calf Veins: No evidence of thrombus. Normal compressibility and flow on color Doppler imaging. Superficial Great Saphenous Vein: No evidence of thrombus. Normal compressibility. Venous Reflux:  None. Other Findings: No evidence of superficial thrombophlebitis or abnormal fluid collection. IMPRESSION: 1. Suggestion of nonocclusive thrombus in the visualized right posterior tibial and peroneal veins. 2. No evidence of deep venous thrombosis in the left lower extremity. Electronically Signed   By: Marcey Moan M.D.   On: 03/19/2024 09:19   CT Angio Chest PE W and/or Wo Contrast Result Date: 03/18/2024 EXAM: CTA CHEST 03/18/2024 09:56:31 PM TECHNIQUE: CTA of the chest was performed after the administration of 75 mL of iohexol  (OMNIPAQUE ) 350 MG/ML injection. Multiplanar reformatted images are provided for review. MIP images are provided for review. Automated exposure control, iterative reconstruction, and/or weight based adjustment of the mA/kV was utilized to reduce the radiation dose to as low as reasonably achievable. COMPARISON: PET CT dated 12/21/2023 CLINICAL HISTORY: eval PE. lung cancer patient, acutely hypoxic, tachy. FINDINGS:  PULMONARY ARTERIES: Pulmonary arteries are adequately opacified for evaluation. Segmental/subsegmental emboli in the right middle and lower lobe pulmonary arteries (images 57, 65, 66, and 70). Overall clot burden is small to moderate. Main pulmonary artery is normal in caliber. MEDIASTINUM: Right chest port terminates at the cavoatrial junction. Cardiomegaly. The pericardium demonstrates no acute abnormality. Mild thoracic aortic atherosclerosis. LYMPH NODES: No mediastinal, hilar or axillary lymphadenopathy. LUNGS AND PLEURA: Multifocal patchy/nodular opacities in the left lung reflecting a combination of atelectasis and tumor, including a 2.0 cm perifissural nodule along the left fissure (image 34) and 4.5 cm pleural-based mass in the posterior left lower hemithorax (image 87). Overall grossly unchanged. Trace left pleural effusion. No pneumothorax. UPPER ABDOMEN: Cholelithiasis. Limited images of the upper abdomen demonstrate cholelithiasis. SOFT TISSUES AND BONES: Severe compression fracture deformity at T6 (sagittal image 81), new. Known multifocal osseous metastases are not evident on CT, although hypermetabolic on PET. No acute soft tissue abnormality. IMPRESSION: 1. Acute segmental/subsegmental pulmonary emboli in the right middle and lower lobe arteries. Small to moderate clot burden. 2. New severe T6 compression fracture deformity. 3. Left lung multifocal opacities reflecting atelectasis and tumor, overall grossly unchanged. 4. Known osseous metastases are better evaluated on PET. 5. Critical Value/emergent results were called by telephone at the time of interpretation on 03/18/2024 at 2215 hrs to provider Dr Claudene. Electronically signed by: Pinkie Pebbles MD 03/18/2024 10:19 PM EST RP Workstation: [id number]   DG Chest Port 1 View Result Date: 03/18/2024 EXAM: 1 VIEW(S) XRAY OF THE CHEST 03/18/2024 07:05:00 PM COMPARISON: 07/22/2023 CLINICAL HISTORY: Questionable sepsis - evaluate for abnormality  FINDINGS: LINES, TUBES AND DEVICES: Right chest wall port stable in position. LUNGS AND PLEURA: Small left pleural effusion. Retrocardiac airspace opacity. Developing left upper lobe airspace opacity. No pneumothorax. HEART AND MEDIASTINUM: Cardiomegaly, unchanged. Aortic arch atherosclerosis. BONES AND SOFT TISSUES: Rotator cuff anchor suture along the right  shoulder noted. No acute osseous abnormality. IMPRESSION: 1. Developing left upper lobe airspace opacity and retrocardiac airspace opacity, suspicious for pneumonia or aspiration. Follow-up PA and lateral chest x-ray is recommended in 3-4 weeks following therapy to ensure resolution and exclude underlying malignancy. 2. Small left pleural effusion with associated retroperitoneal airspace opacity . Electronically signed by: Morgane Naveau MD 03/18/2024 07:27 PM EST RP Workstation: [id number]    PERFORMANCE STATUS (ECOG) : 4 - Bedbound  Review of Systems Unless otherwise noted, a complete review of systems is negative.  Physical Exam General: ill appearing Cardiovascular: Irregular Pulmonary: Unlabored Extremities: no edema, no joint deformities Skin: no rashes Neurological: Weakness but otherwise nonfocal  IMPRESSION: Follow-up visit.  Patient was transferred back to the ICU today due to critical hyponatremia.  I met again with patient's caregiver, Candis.  We again discussed patient's multiple medical problems and overall guarded prognosis.  I explained that even with clinical improvement, patient might not return to her previous baseline or be able to receive further cancer treatment.  Candis says that patient's goal is to continue the current scope of treatment and family would like to see if she improves.  However, she verbalized understanding that patient could decline and we discussed possible option for hospice either at home or in a hospice facility. Candis reiterates that family would like to continue present scope of treatment for  now.  PLAN: - Continue current scope of treatment - Will follow   Time Total: 25 minutes  Visit consisted of counseling and education dealing with the complex and emotionally intense issues of symptom management and palliative care in the setting of serious and potentially life-threatening illness.Greater than 50%  of this time was spent counseling and coordinating care related to the above assessment and plan.  Signed by: Fonda Mower, PhD, NP-C

## 2024-03-29 NOTE — Progress Notes (Signed)
 MEDICATION RELATED CONSULT NOTE - INITIAL   Pharmacy Consult for 3% NaCl infusion Indication: hyponatremia  Allergies  Allergen Reactions   Ezetimibe Other (See Comments)    Body aches, and stiffness   Codeine Diarrhea   Cortisone Swelling    injections   Lovastatin Other (See Comments)    Muscle cramps    Patient Measurements: Height: 5' (152.4 cm) Weight: 83.4 kg (183 lb 13.8 oz) IBW/kg (Calculated) : 45.5  Vital Signs: Temp: 98.4 F (36.9 C) (11/25 0830) Temp Source: Axillary (11/25 0830) BP: 124/99 (11/25 0830) Pulse Rate: 77 (11/25 0830) Intake/Output from previous day: No intake/output data recorded. Intake/Output from this shift: No intake/output data recorded.  Labs: Recent Labs    03/27/24 0400 03/28/24 0520 03/29/24 0159 03/29/24 0321  WBC 16.0* 17.5*  --  18.7*  HGB 8.7* 9.1*  --  9.1*  HCT 27.4* 29.4*  --  28.8*  PLT 426* 448*  --  513*  CREATININE 0.45 0.44 0.39* 0.38*  ALBUMIN  2.8*  --  2.8*  --   PROT 6.1*  --  6.2*  --   AST 22  --  25  --   ALT 15  --  18  --   ALKPHOS 200*  --  185*  --   BILITOT 0.3  --  0.3  --   BILIDIR  --   --  0.2  --   IBILI  --   --  0.2*  --    Estimated Creatinine Clearance: 54.6 mL/min (A) (by C-G formula based on SCr of 0.38 mg/dL (L)).   Microbiology: Recent Results (from the past 720 hours)  Blood Culture (routine x 2)     Status: None   Collection Time: 03/18/24  6:00 PM   Specimen: BLOOD  Result Value Ref Range Status   Specimen Description BLOOD RIGHT ANTECUBITAL  Final   Special Requests   Final    BOTTLES DRAWN AEROBIC AND ANAEROBIC Blood Culture results may not be optimal due to an inadequate volume of blood received in culture bottles   Culture   Final    NO GROWTH 5 DAYS Performed at [facility], [address] [place], [place] [address]    Report Status 03/23/2024 FINAL  Final  Blood Culture (routine x 2)     Status: None   Collection Time: 03/18/24  6:05 PM   Specimen: BLOOD   Result Value Ref Range Status   Specimen Description BLOOD BLOOD LEFT HAND  Final   Special Requests   Final    BOTTLES DRAWN AEROBIC AND ANAEROBIC Blood Culture results may not be optimal due to an inadequate volume of blood received in culture bottles   Culture   Final    NO GROWTH 5 DAYS Performed at [facility], [address] [place], [place] [address]    Report Status 03/23/2024 FINAL  Final  MRSA Next Gen by PCR, Nasal     Status: None   Collection Time: 03/19/24  5:04 AM   Specimen: Anterior Nasal Swab  Result Value Ref Range Status   MRSA by PCR Next Gen NOT DETECTED NOT DETECTED Final    Comment: (NOTE) The GeneXpert MRSA Assay (FDA approved for NASAL specimens only), is one component of a comprehensive MRSA colonization surveillance program. It is not intended to diagnose MRSA infection nor to guide or monitor treatment for MRSA infections. Test performance is not FDA approved in patients less than 2 years old. Performed at [facility], [address] [place], [place] [address]   Resp panel by RT-PCR (RSV, Flu A&B, Covid) Anterior Nasal Swab     Status: None   Collection Time: 03/19/24  5:04 AM   Specimen: Anterior Nasal Swab  Result Value Ref Range Status   SARS Coronavirus 2 by RT PCR NEGATIVE NEGATIVE Final    Comment: (NOTE) SARS-CoV-2 target nucleic acids are NOT DETECTED.  The SARS-CoV-2 RNA is generally detectable in upper respiratory specimens during the acute phase of infection. The lowest concentration of SARS-CoV-2 viral copies this assay can detect is 138 copies/mL. A negative result does not preclude SARS-Cov-2 infection and should not be used as the sole basis for treatment or other patient management decisions. A negative result may occur with  improper specimen collection/handling, submission of specimen other than nasopharyngeal swab, presence of viral mutation(s) within the areas targeted by this assay, and inadequate  number of viral copies(<138 copies/mL). A negative result must be combined with clinical observations, patient history, and epidemiological information. The expected result is Negative.  Fact Sheet for Patients:  bloggercourse.com  Fact Sheet for Healthcare Providers:  seriousbroker.it  This test is no t yet approved or cleared by the [place]  FDA and  has been authorized for detection and/or diagnosis of SARS-CoV-2 by FDA under an Emergency Use Authorization (EUA). This EUA will remain  in effect (meaning this test can be used) for the duration of the COVID-19 declaration under Section 564(b)(1) of the Act, 21 U.S.C.section 360bbb-3(b)(1), unless the authorization is terminated  or revoked sooner.       Influenza A by PCR NEGATIVE NEGATIVE Final   Influenza B by PCR NEGATIVE NEGATIVE Final    Comment: (NOTE) The Xpert Xpress SARS-CoV-2/FLU/RSV plus assay is intended as an aid in the diagnosis of influenza from Nasopharyngeal swab specimens and should not be used as a sole basis for treatment. Nasal washings and aspirates are unacceptable for Xpert Xpress SARS-CoV-2/FLU/RSV testing.  Fact Sheet for Patients: bloggercourse.com  Fact Sheet for Healthcare Providers: seriousbroker.it  This test is not yet approved or cleared by the [place]  FDA and has been authorized for detection and/or diagnosis of SARS-CoV-2 by FDA under an Emergency Use Authorization (EUA). This EUA will remain in effect (meaning this test can be used) for the duration of the COVID-19 declaration under Section 564(b)(1) of the Act, 21 U.S.C. section 360bbb-3(b)(1), unless the authorization is terminated or revoked.     Resp Syncytial Virus by PCR NEGATIVE NEGATIVE Final    Comment: (NOTE) Fact Sheet for Patients: bloggercourse.com  Fact Sheet for Healthcare  Providers: seriousbroker.it  This test is not yet approved or cleared by the [place]  FDA and has been authorized for detection and/or diagnosis of SARS-CoV-2 by FDA under an Emergency Use Authorization (EUA). This EUA will remain in effect (meaning this test can be used) for the duration of the COVID-19 declaration under Section 564(b)(1) of the Act, 21 U.S.C. section 360bbb-3(b)(1), unless the authorization is terminated or revoked.  Performed at [facility], [address] [place], [place] [address]   Respiratory (~20 pathogens) panel by PCR     Status: None   Collection Time: 03/21/24  9:37 AM   Specimen: Nasopharyngeal Swab; Respiratory  Result Value Ref Range Status   Adenovirus NOT DETECTED NOT DETECTED Final   Coronavirus 229E NOT DETECTED NOT DETECTED Final    Comment: (NOTE) The Coronavirus on the Respiratory Panel, DOES NOT test for the novel  Coronavirus (2019 nCoV)  Coronavirus HKU1 NOT DETECTED NOT DETECTED Final   Coronavirus NL63 NOT DETECTED NOT DETECTED Final   Coronavirus OC43 NOT DETECTED NOT DETECTED Final   Metapneumovirus NOT DETECTED NOT DETECTED Final   Rhinovirus / Enterovirus NOT DETECTED NOT DETECTED Final   Influenza A NOT DETECTED NOT DETECTED Final   Influenza B NOT DETECTED NOT DETECTED Final   Parainfluenza Virus 1 NOT DETECTED NOT DETECTED Final   Parainfluenza Virus 2 NOT DETECTED NOT DETECTED Final   Parainfluenza Virus 3 NOT DETECTED NOT DETECTED Final   Parainfluenza Virus 4 NOT DETECTED NOT DETECTED Final   Respiratory Syncytial Virus NOT DETECTED NOT DETECTED Final   Bordetella pertussis NOT DETECTED NOT DETECTED Final   Bordetella Parapertussis NOT DETECTED NOT DETECTED Final   Chlamydophila pneumoniae NOT DETECTED NOT DETECTED Final   Mycoplasma pneumoniae NOT DETECTED NOT DETECTED Final    Comment: Performed at [facility], [address] [place], [place] [address]    Medical  History: Past Medical History:  Diagnosis Date   Acid reflux    Anemia    Anesthesia complication    Tachycardia previously, now on metoprolol    Arthritis    09/02/2019: per patient have it real bad in both hands and back   Difficult intubation    had to terminate intubation unable to advance tube for cholecystectomy 2017?   DVT (deep venous thrombosis) (HCC)    leg   Hypertension    Sciatica    09/02/2019: has had it for about 3 years    Medications:  Scheduled:   ALPRAZolam   0.5 mg Oral QHS   amiodarone   400 mg Oral BID   Followed by   NOREEN ON 03/31/2024] amiodarone   200 mg Oral Daily   apixaban   10 mg Oral BID   Followed by   NOREEN ON 04/01/2024] apixaban   5 mg Oral BID   vitamin C   500 mg Oral BID   Chlorhexidine  Gluconate Cloth  6 each Topical Daily   dextromethorphan -guaiFENesin   1 tablet Oral BID   DULoxetine   30 mg Oral Daily   magic mouthwash w/lidocaine   5 mL Oral QID   metoprolol  tartrate  25 mg Oral BID   Ensure Max Protein  11 oz Oral BID   Infusions:  PRN: acetaminophen , albuterol , alum & mag hydroxide-simeth, artificial tears, bisacodyl , docusate sodium , lip balm, menthol , mouth rinse, polyethylene glycol, simethicone   Assessment: 78 y/o female with h/o HTN, HLD, SVT, metastatic lung cancer on chemotherapy who is admitted with acute segmental PE, CAP, AKI and Afib with RVR. Starting 3% NaCl ISO hyponatremia with furosemide , salt tabs and tolvaptan  unsuccessful.   Goal of Therapy:  Correction of sodium 6 - 8 mEq/L in 24h  Max correction of 10 mEq/L in 24h  Plan:  ---3% NaCl infusion started at 25 mL/hr (per nephrology) ---check sodium q2h x 2 then every 4 hours  --contact MD to get instructions if  Na increases by 4 mEq/L or more in the first 2 hours  or Na increases by 6 mEq/L or more in the first 4 hours or Na increases by more than 10 mEq/L in 24 hours  Hannah Yu 03/29/2024,9:11 AM

## 2024-03-30 DIAGNOSIS — I2602 Saddle embolus of pulmonary artery with acute cor pulmonale: Secondary | ICD-10-CM | POA: Diagnosis not present

## 2024-03-30 LAB — CBC
HCT: 30.9 % — ABNORMAL LOW (ref 36.0–46.0)
Hemoglobin: 9.5 g/dL — ABNORMAL LOW (ref 12.0–15.0)
MCH: 24.2 pg — ABNORMAL LOW (ref 26.0–34.0)
MCHC: 30.7 g/dL (ref 30.0–36.0)
MCV: 78.6 fL — ABNORMAL LOW (ref 80.0–100.0)
Platelets: 463 K/uL — ABNORMAL HIGH (ref 150–400)
RBC: 3.93 MIL/uL (ref 3.87–5.11)
RDW: 23 % — ABNORMAL HIGH (ref 11.5–15.5)
WBC: 16.9 K/uL — ABNORMAL HIGH (ref 4.0–10.5)
nRBC: 0 % (ref 0.0–0.2)

## 2024-03-30 LAB — RENAL FUNCTION PANEL
Albumin: 2.7 g/dL — ABNORMAL LOW (ref 3.5–5.0)
Anion gap: 11 (ref 5–15)
BUN: 12 mg/dL (ref 8–23)
CO2: 25 mmol/L (ref 22–32)
Calcium: 8.8 mg/dL — ABNORMAL LOW (ref 8.9–10.3)
Chloride: 90 mmol/L — ABNORMAL LOW (ref 98–111)
Creatinine, Ser: 0.45 mg/dL (ref 0.44–1.00)
GFR, Estimated: >60 mL/min (ref >60)
Glucose, Bld: 114 mg/dL — ABNORMAL HIGH (ref 70–99)
Phosphorus: 3.7 mg/dL (ref 2.5–4.6)
Potassium: 4.8 mmol/L (ref 3.5–5.1)
Sodium: 125 mmol/L — ABNORMAL LOW (ref 135–145)

## 2024-03-30 LAB — GLUCOSE, CAPILLARY: Glucose-Capillary: 130 mg/dL — ABNORMAL HIGH (ref 70–99)

## 2024-03-30 LAB — SODIUM
Sodium: 128 mmol/L — ABNORMAL LOW (ref 135–145)
Sodium: 124 mmol/L — ABNORMAL LOW (ref 135–145)
Sodium: 125 mmol/L — ABNORMAL LOW (ref 135–145)
Sodium: 127 mmol/L — ABNORMAL LOW (ref 135–145)
Sodium: 127 mmol/L — ABNORMAL LOW (ref 135–145)

## 2024-03-30 LAB — MAGNESIUM: Magnesium: 2 mg/dL (ref 1.7–2.4)

## 2024-03-30 MED ORDER — HYDROMORPHONE HCL 1 MG/ML IJ SOLN
1.0000 mg | INTRAMUSCULAR | Status: DC | PRN
  Administered 2024-03-30 – 2024-04-03 (×17): 1 mg via INTRAVENOUS
  Filled 2024-03-30 (×17): qty 1

## 2024-03-30 MED ORDER — DEXTROSE 5 % IV SOLN: Freq: Once | INTRAVENOUS | Status: AC

## 2024-03-30 MED ORDER — HYDRALAZINE HCL 20 MG/ML IJ SOLN
20.0000 mg | Freq: Four times a day (QID) | INTRAMUSCULAR | Status: DC | PRN
  Administered 2024-03-30: 20 mg via INTRAVENOUS
  Filled 2024-03-30: qty 1

## 2024-03-30 MED ORDER — TRAMADOL HCL 50 MG PO TABS
50.0000 mg | ORAL_TABLET | ORAL | Status: DC | PRN
  Administered 2024-03-30 – 2024-04-02 (×3): 50 mg via ORAL
  Filled 2024-03-30 (×3): qty 1

## 2024-03-30 MED ORDER — ADULT MULTIVITAMIN W/MINERALS CH
1.0000 | ORAL_TABLET | Freq: Every day | ORAL | Status: DC
  Administered 2024-03-31 – 2024-04-03 (×4): 1 via ORAL
  Filled 2024-03-30 (×4): qty 1

## 2024-03-30 MED ORDER — SODIUM CHLORIDE 3 % IV SOLN
INTRAVENOUS | Status: DC
  Filled 2024-03-30 (×2): qty 500

## 2024-03-30 NOTE — Progress Notes (Signed)
 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 Na level is labile. S/p D5W bolus x 1 as per nephro. Nephro following and recs apprec    Acute metabolic encephalopathy: likely secondary to above. Hallucinating. Re-orient prn    Acute segmental PE: w/ US  shows nonocclusive thrombus in right posterior tibial and peroneal vein. IR consulted for acute segmental PE clot burden minimal and thrombolysis contraindicated due to brain metastasis. Continue on eliquis    CAP: completed abx course. Bronchodilators prn. Resp viral panel is neg    Acute hypoxic respiratory failure: likely multifactorial- PE, pneumonia, a.fib, pulmon HTN. Continue on supplemental oxygen and wean as tolerated    Atrial fibrillation: w/ RVR. Continue on amio, eliquis ,   Pulmon HTN: echo 03/19/24: EF 60 to 65%, grade I diastolic dysfunction, right ventricle size moderately enlarged, severely elevated pulmonary artery systolic pressures, severe tricuspid valve regurgitation.   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 Onco recs apprec   AKI: resolved  Microcytic anemia: iron is WNL. No need for a transfusion currently   Thrombocytosis: likely reactive. Will continue to monitor    Generalized weakness: PT/OT recs SNF.        DVT prophylaxis: eliquis  Code Status: DNR Family Communication: discussed pt's care w/ pt's family at bedside and answered their questions Disposition Plan: PT/OT recs  SNF  Level of care: Stepdown Consultants:  Onco ICU Nephro   Procedures:  Antimicrobials:  Subjective: Pt c/o malaise  Objective: Vitals:   03/30/24 0900 03/30/24 1000 03/30/24 1100 03/30/24 1300  BP: (!) 157/57 (!) 153/59 (!) 167/72 (!) 116/52  Pulse: 60 69 65 63  Resp: 15 14 12 14   Temp:      TempSrc:      SpO2: 96% 95% 96%   Weight:      Height:        Intake/Output Summary (Last 24 hours) at 03/30/2024 1306 Last data filed at 03/30/2024 1100 Gross per 24 hour  Intake 1068.17 ml  Output 1550 ml  Net -481.83 ml   Filed Weights   03/28/24 0700 03/29/24 1045 03/30/24 0500  Weight: 83.4 kg 82.8 kg 82.3 kg    Examination:  General exam: Appears calm and comfortable  Respiratory system: decreased breath sounds b/l  Cardiovascular system: S1 & S2+. No rubs, gallops or clicks.  Gastrointestinal system: Abdomen is obese, soft and nontender.  Normal bowel sounds heard. Central nervous system: lethargic Psychiatry: Judgement and insight appears not at baseline. Flat mood and affect    Data Reviewed: I have personally reviewed following labs and imaging studies  CBC: Recent Labs  Lab 03/26/24 0755 03/27/24 0400 03/28/24 0520 03/29/24 0321 03/30/24 0439  WBC 13.7* 16.0* 17.5* 18.7* 16.9*  HGB 8.2* 8.7* 9.1* 9.1* 9.5*  HCT 27.2* 27.4* 29.4* 28.8* 30.9*  MCV 79.3* 77.4* 77.8* 76.2* 78.6*  PLT 435* 426* 448* 513* 463*   Basic Metabolic Panel: Recent Labs  Lab 03/24/24 0335 03/25/24 0450 03/26/24 0755 03/27/24 0400 03/28/24 0520 03/29/24 0159  03/29/24 0321 03/29/24 1407 03/29/24 1830 03/29/24 2302 03/30/24 0438 03/30/24 0642 03/30/24 1000  NA 128* 128* 126* 126* 120* 118* 117*   < > 122* 122* 125* 128* 124*  K 4.0 4.2 4.1 4.3 4.2 4.4 4.3  --   --   --  4.8  --   --   CL 92* 93* 91* 89* 86* 82* 81*  --   --   --  90*  --   --   CO2 27 28 28 31 27 29 29   --   --   --  25  --   --   GLUCOSE 120* 120* 110* 123* 116* 117* 123*  --   --   --  114*   --   --   BUN 11 10 15 16 15 12 11   --   --   --  12  --   --   CREATININE 0.58 0.53 0.54 0.45 0.44 0.39* 0.38*  --   --   --  0.45  --   --   CALCIUM 8.3* 8.4* 8.6* 8.6* 8.5* 8.6* 8.6*  --   --   --  8.8*  --   --   MG 1.8  --   --   --   --   --   --   --   --   --  2.0  --   --   PHOS 2.7 2.9 3.1  --   --   --   --   --   --   --  3.7  --   --    < > = values in this interval not displayed.   GFR: Estimated Creatinine Clearance: 54.2 mL/min (by C-G formula based on SCr of 0.45 mg/dL). Liver Function Tests: Recent Labs  Lab 03/27/24 0400 03/29/24 0159 03/30/24 0438  AST 22 25  --   ALT 15 18  --   ALKPHOS 200* 185*  --   BILITOT 0.3 0.3  --   PROT 6.1* 6.2*  --   ALBUMIN  2.8* 2.8* 2.7*   No results for input(s): LIPASE, AMYLASE in the last 168 hours. No results for input(s): AMMONIA in the last 168 hours. Coagulation Profile: No results for input(s): INR, PROTIME in the last 168 hours. Cardiac Enzymes: No results for input(s): CKTOTAL, CKMB, CKMBINDEX, TROPONINI in the last 168 hours. BNP (last 3 results) Recent Labs    03/18/24 1850 03/20/24 1525 03/21/24 1941  PROBNP 26,777.0* 12,988.0* 14,216.0*   HbA1C: No results for input(s): HGBA1C in the last 72 hours. CBG: Recent Labs  Lab 03/29/24 1038  GLUCAP 154*   Lipid Profile: No results for input(s): CHOL, HDL, LDLCALC, TRIG, CHOLHDL, LDLDIRECT in the last 72 hours. Thyroid  Function Tests: No results for input(s): TSH, T4TOTAL, FREET4, T3FREE, THYROIDAB in the last 72 hours. Anemia Panel: No results for input(s): VITAMINB12, FOLATE, FERRITIN, TIBC, IRON, RETICCTPCT in the last 72 hours. Sepsis Labs: No results for input(s): PROCALCITON, LATICACIDVEN in the last 168 hours.  Recent Results (from the past 240 hours)  Respiratory (~20 pathogens) panel by PCR     Status: None   Collection Time: 03/21/24  9:37 AM   Specimen: Nasopharyngeal Swab; Respiratory   Result Value Ref Range Status   Adenovirus NOT DETECTED NOT DETECTED Final   Coronavirus 229E NOT DETECTED NOT DETECTED Final    Comment: (NOTE) The Coronavirus on the Respiratory Panel, DOES NOT test for the novel  Coronavirus (2019 nCoV)  Coronavirus HKU1 NOT DETECTED NOT DETECTED Final   Coronavirus NL63 NOT DETECTED NOT DETECTED Final   Coronavirus OC43 NOT DETECTED NOT DETECTED Final   Metapneumovirus NOT DETECTED NOT DETECTED Final   Rhinovirus / Enterovirus NOT DETECTED NOT DETECTED Final   Influenza A NOT DETECTED NOT DETECTED Final   Influenza B NOT DETECTED NOT DETECTED Final   Parainfluenza Virus 1 NOT DETECTED NOT DETECTED Final   Parainfluenza Virus 2 NOT DETECTED NOT DETECTED Final   Parainfluenza Virus 3 NOT DETECTED NOT DETECTED Final   Parainfluenza Virus 4 NOT DETECTED NOT DETECTED Final   Respiratory Syncytial Virus NOT DETECTED NOT DETECTED Final   Bordetella pertussis NOT DETECTED NOT DETECTED Final   Bordetella Parapertussis NOT DETECTED NOT DETECTED Final   Chlamydophila pneumoniae NOT DETECTED NOT DETECTED Final   Mycoplasma pneumoniae NOT DETECTED NOT DETECTED Final    Comment: Performed at [facility], [address], [place], [place] [address]         Radiology Studies: DG Chest Port 1 View Result Date: 03/29/2024 EXAM: 1 VIEW(S) XRAY OF THE CHEST 03/29/2024 04:00:00 PM COMPARISON: 03/26/2024 CLINICAL HISTORY: Shortness of breath FINDINGS: LINES, TUBES AND DEVICES: Right Port-A-Cath in place, stable. LUNGS AND PLEURA: Low lung volumes. Left retrocardiac atelectasis/consolidation, stable. Mild diffuse interstitial prominence. Small left pleural effusion. No pneumothorax. HEART AND MEDIASTINUM: No acute abnormality of the cardiac and mediastinal silhouettes. BONES AND SOFT TISSUES: No acute osseous abnormality. IMPRESSION: 1. Left retrocardiac atelectasis/consolidation, stable. 2. Small left pleural effusion. 3. Mild diffuse interstitial  prominence. Electronically signed by: Norleen Boxer MD 03/29/2024 04:34 PM EST RP Workstation: [id number]        Scheduled Meds:  ALPRAZolam   0.5 mg Oral QHS   amiodarone   400 mg Oral BID   Followed by   NOREEN ON 03/31/2024] amiodarone   200 mg Oral Daily   apixaban   10 mg Oral BID   Followed by   NOREEN ON 04/01/2024] apixaban   5 mg Oral BID   vitamin C   500 mg Oral BID   Chlorhexidine  Gluconate Cloth  6 each Topical Daily   dextromethorphan -guaiFENesin   1 tablet Oral BID   DULoxetine   30 mg Oral Daily   magic mouthwash w/lidocaine   5 mL Oral QID   metoprolol  tartrate  25 mg Oral BID   Ensure Max Protein  11 oz Oral BID   Continuous Infusions:  sodium chloride  (hypertonic)       LOS: 12 days        Anthony CHRISTELLA Pouch, MD [facility] Pager [phone]  If 7PM-7AM, please contact night-coverage [email or web address] Password [email or web address] 03/30/2024, 1:06 PM

## 2024-03-30 NOTE — Progress Notes (Signed)
 Patient is complaining of worsening pain, provider notified and orders being placed by provider.  Will endorse to oncoming RN.

## 2024-03-30 NOTE — Plan of Care (Signed)
 Continuing with plan of care.

## 2024-03-30 NOTE — Progress Notes (Signed)
 Order received from nephrology provider to hold 3% Na IV for now and administer D5W bolus once.

## 2024-03-30 NOTE — Progress Notes (Signed)
 [facility]  ROUNDING NOTE   Subjective:   Patient seen sitting up in bed Partially completed breakfast tray at bedside Family present Sodium is corrected to 128 3% hypertonic saline off at this time  Objective:  Vital signs in last 24 hours:  Temp:  [97.8 F (36.6 C)-98.1 F (36.7 C)] 97.8 F (36.6 C) (11/26 0147) Pulse Rate:  [65-77] 66 (11/26 0615) Resp:  [13-19] 13 (11/26 0615) BP: (118-183)/(56-116) 153/68 (11/26 0615) SpO2:  [94 %-99 %] 99 % (11/26 0200) Weight:  [82.3 kg] 82.3 kg (11/26 0500)  Weight change:  Filed Weights   03/28/24 0700 03/29/24 1045 03/30/24 0500  Weight: 83.4 kg 82.8 kg 82.3 kg    Intake/Output: I/O last 3 completed shifts: In: 542.1 [I.V.:542.1] Out: 1550 [Urine:1550]   Intake/Output this shift:  Total I/O In: 22.3 [I.V.:22.3] Out: -   Physical Exam: General: NAD  Head: Normocephalic, atraumatic. Moist oral mucosal membranes  Eyes: Anicteric  Lungs:  Clear to auscultation, normal effort  Heart: Regular rate and rhythm  Abdomen:  Soft, nontender  Extremities: 2-3+ peripheral edema.  Neurologic: Awake, alert, conversant  Skin: Warm,dry, no rash       Basic Metabolic Panel: Recent Labs  Lab 03/24/24 0335 03/25/24 0450 03/26/24 0755 03/27/24 0400 03/28/24 0520 03/29/24 0159 03/29/24 0321 03/29/24 1407 03/29/24 1830 03/29/24 2302 03/30/24 0438 03/30/24 0642  NA 128* 128* 126* 126* 120* 118* 117* 117* 122* 122* 125* 128*  K 4.0 4.2 4.1 4.3 4.2 4.4 4.3  --   --   --  4.8  --   CL 92* 93* 91* 89* 86* 82* 81*  --   --   --  90*  --   CO2 27 28 28 31 27 29 29   --   --   --  25  --   GLUCOSE 120* 120* 110* 123* 116* 117* 123*  --   --   --  114*  --   BUN 11 10 15 16 15 12 11   --   --   --  12  --   CREATININE 0.58 0.53 0.54 0.45 0.44 0.39* 0.38*  --   --   --  0.45  --   CALCIUM 8.3* 8.4* 8.6* 8.6* 8.5* 8.6* 8.6*  --   --   --  8.8*  --   MG 1.8  --   --   --   --   --   --   --   --   --  2.0  --   PHOS 2.7  2.9 3.1  --   --   --   --   --   --   --  3.7  --     Liver Function Tests: Recent Labs  Lab 03/27/24 0400 03/29/24 0159 03/30/24 0438  AST 22 25  --   ALT 15 18  --   ALKPHOS 200* 185*  --   BILITOT 0.3 0.3  --   PROT 6.1* 6.2*  --   ALBUMIN  2.8* 2.8* 2.7*   No results for input(s): LIPASE, AMYLASE in the last 168 hours. No results for input(s): AMMONIA in the last 168 hours.  CBC: Recent Labs  Lab 03/26/24 0755 03/27/24 0400 03/28/24 0520 03/29/24 0321 03/30/24 0439  WBC 13.7* 16.0* 17.5* 18.7* 16.9*  HGB 8.2* 8.7* 9.1* 9.1* 9.5*  HCT 27.2* 27.4* 29.4* 28.8* 30.9*  MCV 79.3* 77.4* 77.8* 76.2* 78.6*  PLT 435* 426* 448* 513* 463*  Cardiac Enzymes: No results for input(s): CKTOTAL, CKMB, CKMBINDEX, TROPONINI in the last 168 hours.  BNP: Invalid input(s): POCBNP  CBG: Recent Labs  Lab 03/29/24 1038  GLUCAP 154*    Microbiology: Results for orders placed or performed during the hospital encounter of 03/18/24  Blood Culture (routine x 2)     Status: None   Collection Time: 03/18/24  6:00 PM   Specimen: BLOOD  Result Value Ref Range Status   Specimen Description BLOOD RIGHT ANTECUBITAL  Final   Special Requests   Final    BOTTLES DRAWN AEROBIC AND ANAEROBIC Blood Culture results may not be optimal due to an inadequate volume of blood received in culture bottles   Culture   Final    NO GROWTH 5 DAYS Performed at [facility], [address] [place], [place] [address]    Report Status 03/23/2024 FINAL  Final  Blood Culture (routine x 2)     Status: None   Collection Time: 03/18/24  6:05 PM   Specimen: BLOOD  Result Value Ref Range Status   Specimen Description BLOOD BLOOD LEFT HAND  Final   Special Requests   Final    BOTTLES DRAWN AEROBIC AND ANAEROBIC Blood Culture results may not be optimal due to an inadequate volume of blood received in culture bottles   Culture   Final    NO GROWTH 5 DAYS Performed at [facility], [address] [place], [place] [address]    Report Status 03/23/2024 FINAL  Final  MRSA Next Gen by PCR, Nasal     Status: None   Collection Time: 03/19/24  5:04 AM   Specimen: Anterior Nasal Swab  Result Value Ref Range Status   MRSA by PCR Next Gen NOT DETECTED NOT DETECTED Final    Comment: (NOTE) The GeneXpert MRSA Assay (FDA approved for NASAL specimens only), is one component of a comprehensive MRSA colonization surveillance program. It is not intended to diagnose MRSA infection nor to guide or monitor treatment for MRSA infections. Test performance is not FDA approved in patients less than 36 years old. Performed at [facility], [address] [place], [place] [address]   Resp panel by RT-PCR (RSV, Flu A&B, Covid) Anterior Nasal Swab     Status: None   Collection Time: 03/19/24  5:04 AM   Specimen: Anterior Nasal Swab  Result Value Ref Range Status   SARS Coronavirus 2 by RT PCR NEGATIVE NEGATIVE Final    Comment: (NOTE) SARS-CoV-2 target nucleic acids are NOT DETECTED.  The SARS-CoV-2 RNA is generally detectable in upper respiratory specimens during the acute phase of infection. The lowest concentration of SARS-CoV-2 viral copies this assay can detect is 138 copies/mL. A negative result does not preclude SARS-Cov-2 infection and should not be used as the sole basis for treatment or other patient management decisions. A negative result may occur with  improper specimen collection/handling, submission of specimen other than nasopharyngeal swab, presence of viral mutation(s) within the areas targeted by this assay, and inadequate number of viral copies(<138 copies/mL). A negative result must be combined with clinical observations, patient history, and epidemiological information. The expected result is Negative.  Fact Sheet for Patients:  bloggercourse.com  Fact Sheet for Healthcare Providers:   seriousbroker.it  This test is no t yet approved or cleared by the [place]  FDA and  has been authorized for detection and/or diagnosis of SARS-CoV-2 by FDA under an Emergency Use Authorization (EUA). This EUA will remain  in  effect (meaning this test can be used) for the duration of the COVID-19 declaration under Section 564(b)(1) of the Act, 21 U.S.C.section 360bbb-3(b)(1), unless the authorization is terminated  or revoked sooner.       Influenza A by PCR NEGATIVE NEGATIVE Final   Influenza B by PCR NEGATIVE NEGATIVE Final    Comment: (NOTE) The Xpert Xpress SARS-CoV-2/FLU/RSV plus assay is intended as an aid in the diagnosis of influenza from Nasopharyngeal swab specimens and should not be used as a sole basis for treatment. Nasal washings and aspirates are unacceptable for Xpert Xpress SARS-CoV-2/FLU/RSV testing.  Fact Sheet for Patients: bloggercourse.com  Fact Sheet for Healthcare Providers: seriousbroker.it  This test is not yet approved or cleared by the [place]  FDA and has been authorized for detection and/or diagnosis of SARS-CoV-2 by FDA under an Emergency Use Authorization (EUA). This EUA will remain in effect (meaning this test can be used) for the duration of the COVID-19 declaration under Section 564(b)(1) of the Act, 21 U.S.C. section 360bbb-3(b)(1), unless the authorization is terminated or revoked.     Resp Syncytial Virus by PCR NEGATIVE NEGATIVE Final    Comment: (NOTE) Fact Sheet for Patients: bloggercourse.com  Fact Sheet for Healthcare Providers: seriousbroker.it  This test is not yet approved or cleared by the [place]  FDA and has been authorized for detection and/or diagnosis of SARS-CoV-2 by FDA under an Emergency Use Authorization (EUA). This EUA will remain in effect (meaning this test can be used) for  the duration of the COVID-19 declaration under Section 564(b)(1) of the Act, 21 U.S.C. section 360bbb-3(b)(1), unless the authorization is terminated or revoked.  Performed at [facility], [address] [place], [place] [address]   Respiratory (~20 pathogens) panel by PCR     Status: None   Collection Time: 03/21/24  9:37 AM   Specimen: Nasopharyngeal Swab; Respiratory  Result Value Ref Range Status   Adenovirus NOT DETECTED NOT DETECTED Final   Coronavirus 229E NOT DETECTED NOT DETECTED Final    Comment: (NOTE) The Coronavirus on the Respiratory Panel, DOES NOT test for the novel  Coronavirus (2019 nCoV)    Coronavirus HKU1 NOT DETECTED NOT DETECTED Final   Coronavirus NL63 NOT DETECTED NOT DETECTED Final   Coronavirus OC43 NOT DETECTED NOT DETECTED Final   Metapneumovirus NOT DETECTED NOT DETECTED Final   Rhinovirus / Enterovirus NOT DETECTED NOT DETECTED Final   Influenza A NOT DETECTED NOT DETECTED Final   Influenza B NOT DETECTED NOT DETECTED Final   Parainfluenza Virus 1 NOT DETECTED NOT DETECTED Final   Parainfluenza Virus 2 NOT DETECTED NOT DETECTED Final   Parainfluenza Virus 3 NOT DETECTED NOT DETECTED Final   Parainfluenza Virus 4 NOT DETECTED NOT DETECTED Final   Respiratory Syncytial Virus NOT DETECTED NOT DETECTED Final   Bordetella pertussis NOT DETECTED NOT DETECTED Final   Bordetella Parapertussis NOT DETECTED NOT DETECTED Final   Chlamydophila pneumoniae NOT DETECTED NOT DETECTED Final   Mycoplasma pneumoniae NOT DETECTED NOT DETECTED Final    Comment: Performed at [facility], [address], [place], [place] [address]    Coagulation Studies: No results for input(s): LABPROT, INR in the last 72 hours.  Urinalysis: No results for input(s): COLORURINE, LABSPEC, PHURINE, GLUCOSEU, HGBUR, BILIRUBINUR, KETONESUR, PROTEINUR, UROBILINOGEN, NITRITE, LEUKOCYTESUR in the last 72 hours.  Invalid input(s): APPERANCEUR     Imaging: DG Chest Port 1 View Result Date: 03/29/2024 EXAM: 1 VIEW(S) XRAY OF THE CHEST 03/29/2024 04:00:00 PM COMPARISON: 03/26/2024 CLINICAL  HISTORY: Shortness of breath FINDINGS: LINES, TUBES AND DEVICES: Right Port-A-Cath in place, stable. LUNGS AND PLEURA: Low lung volumes. Left retrocardiac atelectasis/consolidation, stable. Mild diffuse interstitial prominence. Small left pleural effusion. No pneumothorax. HEART AND MEDIASTINUM: No acute abnormality of the cardiac and mediastinal silhouettes. BONES AND SOFT TISSUES: No acute osseous abnormality. IMPRESSION: 1. Left retrocardiac atelectasis/consolidation, stable. 2. Small left pleural effusion. 3. Mild diffuse interstitial prominence. Electronically signed by: Norleen Boxer MD 03/29/2024 04:34 PM EST RP Workstation: [id number]     Medications:    sodium chloride  (hypertonic) Stopped (03/30/24 0753)    ALPRAZolam   0.5 mg Oral QHS   amiodarone   400 mg Oral BID   Followed by   NOREEN ON 03/31/2024] amiodarone   200 mg Oral Daily   apixaban   10 mg Oral BID   Followed by   NOREEN ON 04/01/2024] apixaban   5 mg Oral BID   vitamin C   500 mg Oral BID   Chlorhexidine  Gluconate Cloth  6 each Topical Daily   dextromethorphan -guaiFENesin   1 tablet Oral BID   DULoxetine   30 mg Oral Daily   magic mouthwash w/lidocaine   5 mL Oral QID   metoprolol  tartrate  25 mg Oral BID   Ensure Max Protein  11 oz Oral BID   acetaminophen , albuterol , alum & mag hydroxide-simeth, artificial tears, bisacodyl , docusate sodium , lip balm, menthol , ondansetron  (ZOFRAN ) IV, mouth rinse, polyethylene glycol, simethicone , sodium chloride  flush, traMADol   Assessment/ Plan:  Hannah Yu is a 78 y.o.  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 Treatment with Furosemide , salt tabs and Tolvaptan  unsuccessful. Recent history of lung cancer with chemo treatments.\ 3% hypertonic saline initiated yesterday. Sodium has corrected to 128, infusion stopped. Will allow sodium to stablize and plan to restart this after noon with decreased rate. Discussed with family and patient that due to lung cancer, it is unlikely to achieve and maintain appropriate sodium level.   2. Acute saddle pulmonary embolism, confirmed on ultrasound, presence of right posterior tibial and peroneal vein. IR consulted but decline intervention due to brain metastasis. Currently on Eliquis .      LOS: 12 Madine Sarr 11/26/202510:56 AM

## 2024-03-30 NOTE — Progress Notes (Signed)
 Provider notified of elevated BP.

## 2024-03-30 NOTE — Progress Notes (Signed)
 MEDICATION RELATED CONSULT NOTE - INITIAL   Pharmacy Consult for 3% NaCl infusion Indication: hyponatremia  Allergies  Allergen Reactions   Ezetimibe Other (See Comments)    Body aches, and stiffness   Codeine Diarrhea   Cortisone Swelling    injections   Lovastatin Other (See Comments)    Muscle cramps    Patient Measurements: Height: 5' (152.4 cm) Weight: 82.3 kg (181 lb 7 oz) IBW/kg (Calculated) : 45.5  Vital Signs: Temp: 97.6 F (36.4 C) (11/26 1200) Temp Source: Axillary (11/26 1200) BP: 116/52 (11/26 1300) Pulse Rate: 63 (11/26 1300) Intake/Output from previous day: 11/25 0701 - 11/26 0700 In: 542.1 [I.V.:542.1] Out: 1550 [Urine:1550] Intake/Output from this shift: Total I/O In: 766 [P.O.:240; I.V.:526] Out: 800 [Urine:800]  Labs: Recent Labs    03/28/24 0520 03/29/24 0159 03/29/24 0321 03/30/24 0438 03/30/24 0439  WBC 17.5*  --  18.7*  --  16.9*  HGB 9.1*  --  9.1*  --  9.5*  HCT 29.4*  --  28.8*  --  30.9*  PLT 448*  --  513*  --  463*  CREATININE 0.44 0.39* 0.38* 0.45  --   MG  --   --   --  2.0  --   PHOS  --   --   --  3.7  --   ALBUMIN   --  2.8*  --  2.7*  --   PROT  --  6.2*  --   --   --   AST  --  25  --   --   --   ALT  --  18  --   --   --   ALKPHOS  --  185*  --   --   --   BILITOT  --  0.3  --   --   --   BILIDIR  --  0.2  --   --   --   IBILI  --  0.2*  --   --   --    Estimated Creatinine Clearance: 54.2 mL/min (by C-G formula based on SCr of 0.45 mg/dL).   Microbiology: Recent Results (from the past 720 hours)  Blood Culture (routine x 2)     Status: None   Collection Time: 03/18/24  6:00 PM   Specimen: BLOOD  Result Value Ref Range Status   Specimen Description BLOOD RIGHT ANTECUBITAL  Final   Special Requests   Final    BOTTLES DRAWN AEROBIC AND ANAEROBIC Blood Culture results may not be optimal due to an inadequate volume of blood received in culture bottles   Culture   Final    NO GROWTH 5 DAYS Performed at [facility], [address] [place], [place] [address]    Report Status 03/23/2024 FINAL  Final  Blood Culture (routine x 2)     Status: None   Collection Time: 03/18/24  6:05 PM   Specimen: BLOOD  Result Value Ref Range Status   Specimen Description BLOOD BLOOD LEFT HAND  Final   Special Requests   Final    BOTTLES DRAWN AEROBIC AND ANAEROBIC Blood Culture results may not be optimal due to an inadequate volume of blood received in culture bottles   Culture   Final    NO GROWTH 5 DAYS Performed at [facility], [address], [place], [place] [address]    Report Status 03/23/2024 FINAL  Final  MRSA Next Gen by PCR, Nasal     Status: None  Collection Time: 03/19/24  5:04 AM   Specimen: Anterior Nasal Swab  Result Value Ref Range Status   MRSA by PCR Next Gen NOT DETECTED NOT DETECTED Final    Comment: (NOTE) The GeneXpert MRSA Assay (FDA approved for NASAL specimens only), is one component of a comprehensive MRSA colonization surveillance program. It is not intended to diagnose MRSA infection nor to guide or monitor treatment for MRSA infections. Test performance is not FDA approved in patients less than 66 years old. Performed at [facility], [address] [place], [place] [address]   Resp panel by RT-PCR (RSV, Flu A&B, Covid) Anterior Nasal Swab     Status: None   Collection Time: 03/19/24  5:04 AM   Specimen: Anterior Nasal Swab  Result Value Ref Range Status   SARS Coronavirus 2 by RT PCR NEGATIVE NEGATIVE Final    Comment: (NOTE) SARS-CoV-2 target nucleic acids are NOT DETECTED.  The SARS-CoV-2 RNA is generally detectable in upper respiratory specimens during the acute phase of infection. The lowest concentration of SARS-CoV-2 viral copies this assay can detect is 138 copies/mL. A negative result does not preclude SARS-Cov-2 infection and should not be used as the sole basis for treatment or other patient management decisions. A negative  result may occur with  improper specimen collection/handling, submission of specimen other than nasopharyngeal swab, presence of viral mutation(s) within the areas targeted by this assay, and inadequate number of viral copies(<138 copies/mL). A negative result must be combined with clinical observations, patient history, and epidemiological information. The expected result is Negative.  Fact Sheet for Patients:  bloggercourse.com  Fact Sheet for Healthcare Providers:  seriousbroker.it  This test is no t yet approved or cleared by the [place]  FDA and  has been authorized for detection and/or diagnosis of SARS-CoV-2 by FDA under an Emergency Use Authorization (EUA). This EUA will remain  in effect (meaning this test can be used) for the duration of the COVID-19 declaration under Section 564(b)(1) of the Act, 21 U.S.C.section 360bbb-3(b)(1), unless the authorization is terminated  or revoked sooner.       Influenza A by PCR NEGATIVE NEGATIVE Final   Influenza B by PCR NEGATIVE NEGATIVE Final    Comment: (NOTE) The Xpert Xpress SARS-CoV-2/FLU/RSV plus assay is intended as an aid in the diagnosis of influenza from Nasopharyngeal swab specimens and should not be used as a sole basis for treatment. Nasal washings and aspirates are unacceptable for Xpert Xpress SARS-CoV-2/FLU/RSV testing.  Fact Sheet for Patients: bloggercourse.com  Fact Sheet for Healthcare Providers: seriousbroker.it  This test is not yet approved or cleared by the [place]  FDA and has been authorized for detection and/or diagnosis of SARS-CoV-2 by FDA under an Emergency Use Authorization (EUA). This EUA will remain in effect (meaning this test can be used) for the duration of the COVID-19 declaration under Section 564(b)(1) of the Act, 21 U.S.C. section 360bbb-3(b)(1), unless the authorization is  terminated or revoked.     Resp Syncytial Virus by PCR NEGATIVE NEGATIVE Final    Comment: (NOTE) Fact Sheet for Patients: bloggercourse.com  Fact Sheet for Healthcare Providers: seriousbroker.it  This test is not yet approved or cleared by the [place]  FDA and has been authorized for detection and/or diagnosis of SARS-CoV-2 by FDA under an Emergency Use Authorization (EUA). This EUA will remain in effect (meaning this test can be used) for the duration of the COVID-19 declaration under Section 564(b)(1) of the Act, 21 U.S.C. section 360bbb-3(b)(1), unless the authorization  is terminated or revoked.  Performed at [facility], [address] [place], [place] [address]   Respiratory (~20 pathogens) panel by PCR     Status: None   Collection Time: 03/21/24  9:37 AM   Specimen: Nasopharyngeal Swab; Respiratory  Result Value Ref Range Status   Adenovirus NOT DETECTED NOT DETECTED Final   Coronavirus 229E NOT DETECTED NOT DETECTED Final    Comment: (NOTE) The Coronavirus on the Respiratory Panel, DOES NOT test for the novel  Coronavirus (2019 nCoV)    Coronavirus HKU1 NOT DETECTED NOT DETECTED Final   Coronavirus NL63 NOT DETECTED NOT DETECTED Final   Coronavirus OC43 NOT DETECTED NOT DETECTED Final   Metapneumovirus NOT DETECTED NOT DETECTED Final   Rhinovirus / Enterovirus NOT DETECTED NOT DETECTED Final   Influenza A NOT DETECTED NOT DETECTED Final   Influenza B NOT DETECTED NOT DETECTED Final   Parainfluenza Virus 1 NOT DETECTED NOT DETECTED Final   Parainfluenza Virus 2 NOT DETECTED NOT DETECTED Final   Parainfluenza Virus 3 NOT DETECTED NOT DETECTED Final   Parainfluenza Virus 4 NOT DETECTED NOT DETECTED Final   Respiratory Syncytial Virus NOT DETECTED NOT DETECTED Final   Bordetella pertussis NOT DETECTED NOT DETECTED Final   Bordetella Parapertussis NOT DETECTED NOT DETECTED Final   Chlamydophila  pneumoniae NOT DETECTED NOT DETECTED Final   Mycoplasma pneumoniae NOT DETECTED NOT DETECTED Final    Comment: Performed at [facility], [address] [place], [place] [address]    Medical History: Past Medical History:  Diagnosis Date   Acid reflux    Anemia    Anesthesia complication    Tachycardia previously, now on metoprolol    Arthritis    09/02/2019: per patient have it real bad in both hands and back   Difficult intubation    had to terminate intubation unable to advance tube for cholecystectomy 2017?   DVT (deep venous thrombosis) (HCC)    leg   Hypertension    Sciatica    09/02/2019: has had it for about 3 years    Medications:  Scheduled:   ALPRAZolam   0.5 mg Oral QHS   amiodarone   400 mg Oral BID   Followed by   NOREEN ON 03/31/2024] amiodarone   200 mg Oral Daily   apixaban   10 mg Oral BID   Followed by   NOREEN ON 04/01/2024] apixaban   5 mg Oral BID   vitamin C   500 mg Oral BID   Chlorhexidine  Gluconate Cloth  6 each Topical Daily   dextromethorphan -guaiFENesin   1 tablet Oral BID   DULoxetine   30 mg Oral Daily   magic mouthwash w/lidocaine   5 mL Oral QID   metoprolol  tartrate  25 mg Oral BID   Ensure Max Protein  11 oz Oral BID   Infusions:   sodium chloride  (hypertonic) 20 mL/hr at 03/30/24 1406   PRN: acetaminophen , albuterol , alum & mag hydroxide-simeth, artificial tears, bisacodyl , docusate sodium , lip balm, menthol , ondansetron  (ZOFRAN ) IV, mouth rinse, polyethylene glycol, simethicone , sodium chloride  flush, traMADol   Assessment: 78 y/o female with h/o HTN, HLD, SVT, metastatic lung cancer on chemotherapy who is admitted with acute segmental PE, CAP, AKI and Afib with RVR. Starting 3% NaCl ISO hyponatremia with furosemide , salt tabs and tolvaptan  unsuccessful.   Goal of Therapy:  Correction of sodium 6 - 8 mEq/L in 24h  Max correction of 10 mEq/L in 24h  Plan:  ---Decrease hypertonic saline to 20 mL/hr as increase seen in last 24  hours (per nephrology) ---Continue to check sodium every 4 hours  --contact MD to get instructions if  Na increases by 4 mEq/L or more in the first 2 hours  or Na increases by 6 mEq/L or more in the first 4 hours or Na increases by more than 10 mEq/L in 24 hours  Tonita Bills A Bristyn Kulesza 03/30/2024,3:00 PM

## 2024-03-30 NOTE — Progress Notes (Signed)
 Per pharmacy continue with Q4H sodium level checks; continue to hold hypertonic saline at this time due to protocol.

## 2024-03-30 NOTE — Plan of Care (Signed)
 Hyponatremia improving, follow up Nephrology Recs

## 2024-03-30 NOTE — Consult Note (Addendum)
 WOC Nurse follow-up consult Note: Refer to previous WOC notes on 11/20; previously noted to have a possible Deep tissue pressure injury to the inner gluteal fold and bilat buttocks at that time. The skin is intact today with no further breakdown.  It is darker-colored and the appearance is consistent with chronic tissue damage which is the result of sitting in a recliner for a prolonged period of time. Pt states this was the case prior to admission. It is NOT a pressure injury.  Foam dressing in place to protect from further injury.  No further role for WOC team.  Please re-consult if further assistance is needed.  Thank-you,  Stephane Fought MSN, RN, CWOCN, CWCN-AP, CNS Contact Mon-Fri 0700-1500: [phone]

## 2024-03-30 NOTE — Plan of Care (Signed)
  Problem: Education: Goal: Knowledge of General Education information will improve Description: Including pain rating scale, medication(s)/side effects and non-pharmacologic comfort measures Outcome: Progressing   Problem: Clinical Measurements: Goal: Ability to maintain clinical measurements within normal limits will improve Outcome: Progressing Goal: Will remain free from infection Outcome: Progressing Goal: Diagnostic test results will improve Outcome: Progressing Goal: Respiratory complications will improve Outcome: Progressing   Problem: Activity: Goal: Risk for activity intolerance will decrease Outcome: Not Progressing   Problem: Nutrition: Goal: Adequate nutrition will be maintained Outcome: Not Progressing   Problem: Coping: Goal: Level of anxiety will decrease Outcome: Not Progressing

## 2024-03-30 NOTE — Progress Notes (Addendum)
 Nutrition Follow Up Note   DOCUMENTATION CODES:   Obesity unspecified  INTERVENTION:   Ensure Max protein supplement po BID, each supplement provides 150kcal and 30g of protein.  Magic cup TID with meals, each supplement provides 290 kcal and 9 grams of protein  MVI po daily   Vitamin C  500mg  po BID   Daily weights   NUTRITION DIAGNOSIS:   Inadequate oral intake related to acute illness as evidenced by per patient/family report.  GOAL:   Patient will meet greater than or equal to 90% of their needs -progressing   MONITOR:   PO intake, Supplement acceptance, Labs, Weight trends, I & O's, Skin  ASSESSMENT:   78 y/o female with h/o HTN, HLD, SVT, metastatic lung cancer on chemotherapy who is admitted with acute segmental PE, CAP, AKI and Afib with RVR.  Pt with fairly good appetite and oral intake in hospital. Pt eating 50-95% of most meals. Pt is drinking the Ensure supplements. Refeed labs stable. Per chart, pt is up ~17lbs since admission.   Medications reviewed and include: vitamin C , MVI  Labs reviewed: Na 125(L), K 4.3 wnl, creat 0.38(L), P 3.7 wnl, Mg 2.0 wnl  Iron 30, TIBC 228(L), ferritin 1442(H), folate 12.3, B12 >4,000(H)- 11/22 Wbc- 16.9(H), Hgb 9.5(L), Hct 30.9(L), MCV 78.6(L), MCH 24.2(L)  UOP-   Diet Order:    Diet Order             Diet regular Room service appropriate? Yes; Fluid consistency: Thin  Diet effective now                  EDUCATION NEEDS:   Education needs have been addressed  Skin:  Skin Assessment: Reviewed RN Assessment (chronic tissue damage buttocks)  Last BM:  11/25- type 1  Height:   Ht Readings from Last 1 Encounters:  03/18/24 5' (1.524 m)    Weight:   Wt Readings from Last 1 Encounters:  03/30/24 82.3 kg    Ideal Body Weight:  45.45 kg  BMI:  Body mass index is 35.43 kg/m.  Estimated Nutritional Needs:   Kcal:  1700-1900kcal/day  Protein:  85-95g/day  Fluid:  1.4-1.6L/day  Augustin Shams MS, RD, LDN If unable to be reached, please send secure chat to RD inpatient available from 8:00a-4:00p daily

## 2024-03-31 ENCOUNTER — Inpatient Hospital Stay

## 2024-03-31 DIAGNOSIS — I2602 Saddle embolus of pulmonary artery with acute cor pulmonale: Secondary | ICD-10-CM | POA: Diagnosis not present

## 2024-03-31 LAB — SODIUM
Sodium: 129 mmol/L — ABNORMAL LOW (ref 135–145)
Sodium: 130 mmol/L — ABNORMAL LOW (ref 135–145)
Sodium: 131 mmol/L — ABNORMAL LOW (ref 135–145)
Sodium: 131 mmol/L — ABNORMAL LOW (ref 135–145)
Sodium: 130 mmol/L — ABNORMAL LOW (ref 135–145)

## 2024-03-31 LAB — RENAL FUNCTION PANEL
Albumin: 2.6 g/dL — ABNORMAL LOW (ref 3.5–5.0)
Anion gap: 7 (ref 5–15)
BUN: 12 mg/dL (ref 8–23)
CO2: 27 mmol/L (ref 22–32)
Calcium: 8.4 mg/dL — ABNORMAL LOW (ref 8.9–10.3)
Chloride: 96 mmol/L — ABNORMAL LOW (ref 98–111)
Creatinine, Ser: 0.46 mg/dL (ref 0.44–1.00)
GFR, Estimated: >60 mL/min (ref >60)
Glucose, Bld: 114 mg/dL — ABNORMAL HIGH (ref 70–99)
Phosphorus: 3.7 mg/dL (ref 2.5–4.6)
Potassium: 4.2 mmol/L (ref 3.5–5.1)
Sodium: 130 mmol/L — ABNORMAL LOW (ref 135–145)

## 2024-03-31 LAB — CBC
HCT: 26.7 % — ABNORMAL LOW (ref 36.0–46.0)
Hemoglobin: 8.2 g/dL — ABNORMAL LOW (ref 12.0–15.0)
MCH: 24.8 pg — ABNORMAL LOW (ref 26.0–34.0)
MCHC: 30.7 g/dL (ref 30.0–36.0)
MCV: 80.7 fL (ref 80.0–100.0)
Platelets: 467 K/uL — ABNORMAL HIGH (ref 150–400)
RBC: 3.31 MIL/uL — ABNORMAL LOW (ref 3.87–5.11)
RDW: 23.2 % — ABNORMAL HIGH (ref 11.5–15.5)
WBC: 19.2 K/uL — ABNORMAL HIGH (ref 4.0–10.5)
nRBC: 0 % (ref 0.0–0.2)

## 2024-03-31 MED ORDER — ALBUTEROL SULFATE (2.5 MG/3ML) 0.083% IN NEBU
2.5000 mg | INHALATION_SOLUTION | Freq: Four times a day (QID) | RESPIRATORY_TRACT | Status: DC
  Administered 2024-03-31 – 2024-04-03 (×11): 2.5 mg via RESPIRATORY_TRACT
  Filled 2024-03-31 (×11): qty 3

## 2024-03-31 MED ORDER — BENZONATATE 100 MG PO CAPS
200.0000 mg | ORAL_CAPSULE | Freq: Three times a day (TID) | ORAL | Status: DC | PRN
  Administered 2024-03-31 – 2024-04-01 (×2): 200 mg via ORAL
  Filled 2024-03-31 (×2): qty 2

## 2024-03-31 MED ORDER — FUROSEMIDE 10 MG/ML IJ SOLN
40.0000 mg | INTRAMUSCULAR | Status: AC
  Administered 2024-03-31: 40 mg via INTRAVENOUS
  Filled 2024-03-31: qty 4

## 2024-03-31 MED ORDER — FUROSEMIDE 10 MG/ML IJ SOLN: 40.0000 mg | Freq: Once | INTRAMUSCULAR | Status: DC

## 2024-03-31 NOTE — Progress Notes (Signed)
 MEDICATION RELATED CONSULT NOTE - INITIAL   Pharmacy Consult for 3% NaCl infusion Indication: hyponatremia  Allergies  Allergen Reactions   Ezetimibe Other (See Comments)    Body aches, and stiffness   Codeine Diarrhea   Cortisone Swelling    injections   Lovastatin Other (See Comments)    Muscle cramps    Patient Measurements: Height: 5' (152.4 cm) Weight: 83.8 kg (184 lb 11.9 oz) IBW/kg (Calculated) : 45.5  Vital Signs: Temp: 98.6 F (37 C) (11/27 1600) Temp Source: Oral (11/27 1600) BP: 139/61 (11/27 1800) Pulse Rate: 74 (11/27 1800) Intake/Output from previous day: 11/26 0701 - 11/27 0700 In: 1099.1 [P.O.:240; I.V.:859.1] Out: 950 [Urine:950] Intake/Output from this shift: Total I/O In: 82.5 [I.V.:82.5] Out: 725 [Urine:725]  Labs: Recent Labs    03/29/24 0159 03/29/24 0321 03/30/24 0438 03/30/24 0439 03/31/24 0553 03/31/24 0554  WBC  --  18.7*  --  16.9* 19.2*  --   HGB  --  9.1*  --  9.5* 8.2*  --   HCT  --  28.8*  --  30.9* 26.7*  --   PLT  --  513*  --  463* 467*  --   CREATININE 0.39* 0.38* 0.45  --   --  0.46  MG  --   --  2.0  --   --   --   PHOS  --   --  3.7  --   --  3.7  ALBUMIN  2.8*  --  2.7*  --   --  2.6*  PROT 6.2*  --   --   --   --   --   AST 25  --   --   --   --   --   ALT 18  --   --   --   --   --   ALKPHOS 185*  --   --   --   --   --   BILITOT 0.3  --   --   --   --   --   BILIDIR 0.2  --   --   --   --   --   IBILI 0.2*  --   --   --   --   --    Estimated Creatinine Clearance: 54.7 mL/min (by C-G formula based on SCr of 0.46 mg/dL).   Microbiology: Recent Results (from the past 720 hours)  Blood Culture (routine x 2)     Status: None   Collection Time: 03/18/24  6:00 PM   Specimen: BLOOD  Result Value Ref Range Status   Specimen Description BLOOD RIGHT ANTECUBITAL  Final   Special Requests   Final    BOTTLES DRAWN AEROBIC AND ANAEROBIC Blood Culture results may not be optimal due to an inadequate volume of blood  received in culture bottles   Culture   Final    NO GROWTH 5 DAYS Performed at [facility], [address] [place], [place] [address]    Report Status 03/23/2024 FINAL  Final  Blood Culture (routine x 2)     Status: None   Collection Time: 03/18/24  6:05 PM   Specimen: BLOOD  Result Value Ref Range Status   Specimen Description BLOOD BLOOD LEFT HAND  Final   Special Requests   Final    BOTTLES DRAWN AEROBIC AND ANAEROBIC Blood Culture results may not be optimal due to an inadequate volume of blood received in culture bottles   Culture  Final    NO GROWTH 5 DAYS Performed at [facility], [address] [place], [place] [address]    Report Status 03/23/2024 FINAL  Final  MRSA Next Gen by PCR, Nasal     Status: None   Collection Time: 03/19/24  5:04 AM   Specimen: Anterior Nasal Swab  Result Value Ref Range Status   MRSA by PCR Next Gen NOT DETECTED NOT DETECTED Final    Comment: (NOTE) The GeneXpert MRSA Assay (FDA approved for NASAL specimens only), is one component of a comprehensive MRSA colonization surveillance program. It is not intended to diagnose MRSA infection nor to guide or monitor treatment for MRSA infections. Test performance is not FDA approved in patients less than 76 years old. Performed at [facility], [address] [place], [place] [address]   Resp panel by RT-PCR (RSV, Flu A&B, Covid) Anterior Nasal Swab     Status: None   Collection Time: 03/19/24  5:04 AM   Specimen: Anterior Nasal Swab  Result Value Ref Range Status   SARS Coronavirus 2 by RT PCR NEGATIVE NEGATIVE Final    Comment: (NOTE) SARS-CoV-2 target nucleic acids are NOT DETECTED.  The SARS-CoV-2 RNA is generally detectable in upper respiratory specimens during the acute phase of infection. The lowest concentration of SARS-CoV-2 viral copies this assay can detect is 138 copies/mL. A negative result does not preclude SARS-Cov-2 infection and should not be  used as the sole basis for treatment or other patient management decisions. A negative result may occur with  improper specimen collection/handling, submission of specimen other than nasopharyngeal swab, presence of viral mutation(s) within the areas targeted by this assay, and inadequate number of viral copies(<138 copies/mL). A negative result must be combined with clinical observations, patient history, and epidemiological information. The expected result is Negative.  Fact Sheet for Patients:  bloggercourse.com  Fact Sheet for Healthcare Providers:  seriousbroker.it  This test is no t yet approved or cleared by the [place]  FDA and  has been authorized for detection and/or diagnosis of SARS-CoV-2 by FDA under an Emergency Use Authorization (EUA). This EUA will remain  in effect (meaning this test can be used) for the duration of the COVID-19 declaration under Section 564(b)(1) of the Act, 21 U.S.C.section 360bbb-3(b)(1), unless the authorization is terminated  or revoked sooner.       Influenza A by PCR NEGATIVE NEGATIVE Final   Influenza B by PCR NEGATIVE NEGATIVE Final    Comment: (NOTE) The Xpert Xpress SARS-CoV-2/FLU/RSV plus assay is intended as an aid in the diagnosis of influenza from Nasopharyngeal swab specimens and should not be used as a sole basis for treatment. Nasal washings and aspirates are unacceptable for Xpert Xpress SARS-CoV-2/FLU/RSV testing.  Fact Sheet for Patients: bloggercourse.com  Fact Sheet for Healthcare Providers: seriousbroker.it  This test is not yet approved or cleared by the [place]  FDA and has been authorized for detection and/or diagnosis of SARS-CoV-2 by FDA under an Emergency Use Authorization (EUA). This EUA will remain in effect (meaning this test can be used) for the duration of the COVID-19 declaration under Section  564(b)(1) of the Act, 21 U.S.C. section 360bbb-3(b)(1), unless the authorization is terminated or revoked.     Resp Syncytial Virus by PCR NEGATIVE NEGATIVE Final    Comment: (NOTE) Fact Sheet for Patients: bloggercourse.com  Fact Sheet for Healthcare Providers: seriousbroker.it  This test is not yet approved or cleared by the [place]  FDA and has been authorized for detection  and/or diagnosis of SARS-CoV-2 by FDA under an Emergency Use Authorization (EUA). This EUA will remain in effect (meaning this test can be used) for the duration of the COVID-19 declaration under Section 564(b)(1) of the Act, 21 U.S.C. section 360bbb-3(b)(1), unless the authorization is terminated or revoked.  Performed at [facility], [address] [place], [place] [address]   Respiratory (~20 pathogens) panel by PCR     Status: None   Collection Time: 03/21/24  9:37 AM   Specimen: Nasopharyngeal Swab; Respiratory  Result Value Ref Range Status   Adenovirus NOT DETECTED NOT DETECTED Final   Coronavirus 229E NOT DETECTED NOT DETECTED Final    Comment: (NOTE) The Coronavirus on the Respiratory Panel, DOES NOT test for the novel  Coronavirus (2019 nCoV)    Coronavirus HKU1 NOT DETECTED NOT DETECTED Final   Coronavirus NL63 NOT DETECTED NOT DETECTED Final   Coronavirus OC43 NOT DETECTED NOT DETECTED Final   Metapneumovirus NOT DETECTED NOT DETECTED Final   Rhinovirus / Enterovirus NOT DETECTED NOT DETECTED Final   Influenza A NOT DETECTED NOT DETECTED Final   Influenza B NOT DETECTED NOT DETECTED Final   Parainfluenza Virus 1 NOT DETECTED NOT DETECTED Final   Parainfluenza Virus 2 NOT DETECTED NOT DETECTED Final   Parainfluenza Virus 3 NOT DETECTED NOT DETECTED Final   Parainfluenza Virus 4 NOT DETECTED NOT DETECTED Final   Respiratory Syncytial Virus NOT DETECTED NOT DETECTED Final   Bordetella pertussis NOT DETECTED NOT DETECTED  Final   Bordetella Parapertussis NOT DETECTED NOT DETECTED Final   Chlamydophila pneumoniae NOT DETECTED NOT DETECTED Final   Mycoplasma pneumoniae NOT DETECTED NOT DETECTED Final    Comment: Performed at [facility], [address], [place], [place] [address]    Medical History: Past Medical History:  Diagnosis Date   Acid reflux    Anemia    Anesthesia complication    Tachycardia previously, now on metoprolol    Arthritis    09/02/2019: per patient have it real bad in both hands and back   Difficult intubation    had to terminate intubation unable to advance tube for cholecystectomy 2017?   DVT (deep venous thrombosis) (HCC)    leg   Hypertension    Sciatica    09/02/2019: has had it for about 3 years    Medications:  Scheduled:   albuterol   2.5 mg Nebulization Q6H   ALPRAZolam   0.5 mg Oral QHS   amiodarone   200 mg Oral Daily   apixaban   10 mg Oral BID   Followed by   NOREEN ON 04/01/2024] apixaban   5 mg Oral BID   vitamin C   500 mg Oral BID   Chlorhexidine  Gluconate Cloth  6 each Topical Daily   dextromethorphan -guaiFENesin   1 tablet Oral BID   DULoxetine   30 mg Oral Daily   magic mouthwash w/lidocaine   5 mL Oral QID   metoprolol  tartrate  25 mg Oral BID   multivitamin with minerals  1 tablet Oral Daily   Ensure Max Protein  11 oz Oral BID   Infusions:    PRN: acetaminophen , alum & mag hydroxide-simeth, artificial tears, benzonatate , bisacodyl , docusate sodium , hydrALAZINE , HYDROmorphone  (DILAUDID ) injection, lip balm, menthol , ondansetron  (ZOFRAN ) IV, mouth rinse, polyethylene glycol, simethicone , sodium chloride  flush, traMADol   Assessment: 78 y/o female with h/o HTN, HLD, SVT, metastatic lung cancer on chemotherapy who is admitted with acute segmental PE, CAP, AKI and Afib with RVR. Starting 3% NaCl ISO hyponatremia with furosemide , salt tabs and  tolvaptan  unsuccessful.   Goal of Therapy:  Correction of sodium 6 - 8 mEq/L in 24h  Max correction of 10  mEq/L in 24h  Plan:  --- hypertonic saline drip order discontinued by nephrology 11/27 @ 1043    Destan Franchini A 03/31/2024,6:28 PM

## 2024-03-31 NOTE — Progress Notes (Addendum)
 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 Na level is trending up today. S/p D5W bolus, 3% saline, tolvaptan , salt tabs as per nephro. Nephro following and recs apprec    Acute metabolic encephalopathy: likely secondary to above. Hallucinating. Re-orient prn    Acute segmental PE: w/ US  shows nonocclusive thrombus in right posterior tibial and peroneal vein. IR consulted for acute segmental PE clot burden minimal and thrombolysis contraindicated due to brain metastasis. Continue on eliquis    CAP: completed abx course. Bronchodilators prn. Resp viral panel is neg    Acute hypoxic respiratory failure: likely multifactorial- PE, pneumonia, a.fib, pulmon HTN. Increased oxygen demand and now maxed out on heated HFNC. CXR stat ordered as well as IV lasix  x1.    Atrial fibrillation: w/ RVR. Continue on amio, eliquis    Pulmon HTN: echo 03/19/24: EF 60 to 65%, grade I diastolic dysfunction, right ventricle size moderately enlarged, severely elevated pulmonary artery systolic pressures, severe tricuspid valve regurgitation.   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 Onco recs apprec   AKI: resolved  Microcytic anemia: iron is WNL. Will transfuse if Hb < 7.0   Thrombocytosis: likely reactive. Will continue to monitor    Generalized weakness: PT/OT recs SNF. Pt does not want SNF. Considering going home w/ hospice vs HH as per pt's friend  at bedside        DVT prophylaxis: eliquis  Code Status: DNR Family Communication: discussed pt's care w/ pt's friend, Candis, at bedside and answered her questions Disposition Plan: PT/OT recs SNF. High risk for further decline.   Level of care: Stepdown Consultants:  Onco ICU Nephro   Procedures:  Antimicrobials:  Subjective: Pt is lethargic   Objective: Vitals:   03/31/24 0600 03/31/24 0800 03/31/24 0900 03/31/24 0952  BP: (!) 125/54 (!) 125/52 (!) 118/55 (!) 118/55  Pulse: 70 70 74 74  Resp: 11 13    Temp: 98.4 F (36.9 C) 98.6 F (37 C)    TempSrc: Oral Oral    SpO2: 94% 94%    Weight:      Height:        Intake/Output Summary (Last 24 hours) at 03/31/2024 1205 Last data filed at 03/31/2024 1003 Gross per 24 hour  Intake 611.88 ml  Output 150 ml  Net 461.88 ml   Filed Weights   03/29/24 1045 03/30/24 0500 03/31/24 0500  Weight: 82.8 kg 82.3 kg 83.8 kg    Examination:  General exam: appears lethargic  Respiratory system: diminished breath sounds b/l  Cardiovascular system: S1/S2+. No rubs or clicks   Gastrointestinal system: abd is soft, NT, obese & hypoactive bowel sounds  Central nervous system: lethargic  Psychiatry: judgement and insight appears not at baseline.     Data Reviewed: I have personally reviewed following labs and imaging studies  CBC: Recent Labs  Lab 03/27/24 0400 03/28/24 0520 03/29/24 0321 03/30/24 0439 03/31/24 0553  WBC 16.0* 17.5* 18.7* 16.9* 19.2*  HGB  8.7* 9.1* 9.1* 9.5* 8.2*  HCT 27.4* 29.4* 28.8* 30.9* 26.7*  MCV 77.4* 77.8* 76.2* 78.6* 80.7  PLT 426* 448* 513* 463* 467*   Basic Metabolic Panel: Recent Labs  Lab 03/25/24 0450 03/26/24 0755 03/27/24 0400 03/28/24 0520 03/29/24 0159 03/29/24 0321 03/29/24 1407 03/30/24 0438 03/30/24 0642 03/30/24 1747 03/30/24 2221 03/31/24 0221 03/31/24 0554 03/31/24 1012  NA 128* 126*   < > 120* 118* 117*   < > 125*   < > 127* 127* 129* 130* 130*  K 4.2 4.1    < > 4.2 4.4 4.3  --  4.8  --   --   --   --  4.2  --   CL 93* 91*   < > 86* 82* 81*  --  90*  --   --   --   --  96*  --   CO2 28 28   < > 27 29 29   --  25  --   --   --   --  27  --   GLUCOSE 120* 110*   < > 116* 117* 123*  --  114*  --   --   --   --  114*  --   BUN 10 15   < > 15 12 11   --  12  --   --   --   --  12  --   CREATININE 0.53 0.54   < > 0.44 0.39* 0.38*  --  0.45  --   --   --   --  0.46  --   CALCIUM 8.4* 8.6*   < > 8.5* 8.6* 8.6*  --  8.8*  --   --   --   --  8.4*  --   MG  --   --   --   --   --   --   --  2.0  --   --   --   --   --   --   PHOS 2.9 3.1  --   --   --   --   --  3.7  --   --   --   --  3.7  --    < > = values in this interval not displayed.   GFR: Estimated Creatinine Clearance: 54.7 mL/min (by C-G formula based on SCr of 0.46 mg/dL). Liver Function Tests: Recent Labs  Lab 03/27/24 0400 03/29/24 0159 03/30/24 0438 03/31/24 0554  AST 22 25  --   --   ALT 15 18  --   --   ALKPHOS 200* 185*  --   --   BILITOT 0.3 0.3  --   --   PROT 6.1* 6.2*  --   --   ALBUMIN  2.8* 2.8* 2.7* 2.6*   No results for input(s): LIPASE, AMYLASE in the last 168 hours. No results for input(s): AMMONIA in the last 168 hours. Coagulation Profile: No results for input(s): INR, PROTIME in the last 168 hours. Cardiac Enzymes: No results for input(s): CKTOTAL, CKMB, CKMBINDEX, TROPONINI in the last 168 hours. BNP (last 3 results) Recent Labs    03/18/24 1850 03/20/24 1525 03/21/24 1941  PROBNP 26,777.0* 12,988.0* 14,216.0*   HbA1C: No results for input(s): HGBA1C in the last 72 hours. CBG: Recent Labs  Lab 03/29/24 1038 03/30/24 1631  GLUCAP 154* 130*   Lipid Profile: No results for input(s): CHOL, HDL, LDLCALC, TRIG, CHOLHDL, LDLDIRECT in the last 72 hours. Thyroid   Function Tests: No results for input(s): TSH, T4TOTAL, FREET4, T3FREE, THYROIDAB in the last 72 hours. Anemia Panel: No results for input(s): VITAMINB12,  FOLATE, FERRITIN, TIBC, IRON, RETICCTPCT in the last 72 hours. Sepsis Labs: No results for input(s): PROCALCITON, LATICACIDVEN in the last 168 hours.  No results found for this or any previous visit (from the past 240 hours).        Radiology Studies: DG Chest Port 1 View Result Date: 03/29/2024 EXAM: 1 VIEW(S) XRAY OF THE CHEST 03/29/2024 04:00:00 PM COMPARISON: 03/26/2024 CLINICAL HISTORY: Shortness of breath FINDINGS: LINES, TUBES AND DEVICES: Right Port-A-Cath in place, stable. LUNGS AND PLEURA: Low lung volumes. Left retrocardiac atelectasis/consolidation, stable. Mild diffuse interstitial prominence. Small left pleural effusion. No pneumothorax. HEART AND MEDIASTINUM: No acute abnormality of the cardiac and mediastinal silhouettes. BONES AND SOFT TISSUES: No acute osseous abnormality. IMPRESSION: 1. Left retrocardiac atelectasis/consolidation, stable. 2. Small left pleural effusion. 3. Mild diffuse interstitial prominence. Electronically signed by: Norleen Boxer MD 03/29/2024 04:34 PM EST RP Workstation: [id number]        Scheduled Meds:  ALPRAZolam   0.5 mg Oral QHS   amiodarone   200 mg Oral Daily   apixaban   10 mg Oral BID   Followed by   NOREEN ON 04/01/2024] apixaban   5 mg Oral BID   vitamin C   500 mg Oral BID   Chlorhexidine  Gluconate Cloth  6 each Topical Daily   dextromethorphan -guaiFENesin   1 tablet Oral BID   DULoxetine   30 mg Oral Daily   magic mouthwash w/lidocaine   5 mL Oral QID   metoprolol  tartrate  25 mg Oral BID   multivitamin with minerals  1 tablet Oral Daily   Ensure Max Protein  11 oz Oral BID   Continuous Infusions:     LOS: 13 days        Anthony CHRISTELLA Pouch, MD [facility] Pager [phone]  If 7PM-7AM, please contact night-coverage [email or web address] Password [facility] 03/31/2024, 12:05 PM

## 2024-03-31 NOTE — Plan of Care (Signed)
  Problem: Education: Goal: Knowledge of General Education information will improve Description: Including pain rating scale, medication(s)/side effects and non-pharmacologic comfort measures Outcome: Progressing   Problem: Health Behavior/Discharge Planning: Goal: Ability to manage health-related needs will improve Outcome: Progressing   Problem: Clinical Measurements: Goal: Ability to maintain clinical measurements within normal limits will improve Outcome: Progressing Goal: Will remain free from infection Outcome: Progressing Goal: Diagnostic test results will improve Outcome: Progressing Goal: Respiratory complications will improve Outcome: Progressing Goal: Cardiovascular complication will be avoided Outcome: Progressing   Problem: Activity: Goal: Risk for activity intolerance will decrease Outcome: Progressing   Problem: Nutrition: Goal: Adequate nutrition will be maintained Outcome: Progressing   Problem: Coping: Goal: Level of anxiety will decrease Outcome: Progressing   Problem: Elimination: Goal: Will not experience complications related to bowel motility Outcome: Progressing Goal: Will not experience complications related to urinary retention Outcome: Progressing   Problem: Pain Managment: Goal: General experience of comfort will improve and/or be controlled Outcome: Progressing   Problem: Safety: Goal: Ability to remain free from injury will improve Outcome: Progressing   Problem: Skin Integrity: Goal: Risk for impaired skin integrity will decrease Outcome: Progressing   Problem: Education: Goal: Knowledge of disease or condition will improve Outcome: Progressing Goal: Understanding of medication regimen will improve Outcome: Progressing Goal: Individualized Educational Video(s) Outcome: Progressing   Problem: Activity: Goal: Ability to tolerate increased activity will improve Outcome: Progressing   Problem: Cardiac: Goal: Ability to achieve  and maintain adequate cardiopulmonary perfusion will improve Outcome: Progressing   Problem: Health Behavior/Discharge Planning: Goal: Ability to safely manage health-related needs after discharge will improve Outcome: Progressing   Problem: Education: Goal: Knowledge of the prescribed therapeutic regimen will improve Outcome: Progressing   Problem: Activity: Goal: Ability to implement measures to reduce episodes of fatigue will improve Outcome: Progressing   Problem: Bowel/Gastric: Goal: Will not experience complications related to bowel motility Outcome: Progressing   Problem: Coping: Goal: Ability to identify and develop effective coping behavior will improve Outcome: Progressing   Problem: Nutritional: Goal: Maintenance of adequate nutrition will improve Outcome: Progressing

## 2024-03-31 NOTE — TOC Progression Note (Signed)
 Transition of Care [facility]) - Progression Note    Patient Details  Name: Hannah Yu MRN: [id number] Date of Birth: [birth year]  [facility] CM/SW Contact  K'La JINNY Ruts, LCSW Phone Number: 03/31/2024, 1:59 PM  Clinical Narrative:    Chart reviewed. Spoke with the patient POA. I informed the patient POA that the patient could not have HH and Hospice care. The patient POA verbalized understanding. I explained that the patient can have pallative and and private care with [facility]. Patient POA verbalized understanding.                    Expected Discharge Plan and Services                                               Social Drivers of Health (SDOH) Interventions SDOH Screenings   Food Insecurity: No Food Insecurity (03/20/2024)  Housing: Low Risk  (03/20/2024)  Transportation Needs: No Transportation Needs (03/20/2024)  Utilities: Not At Risk (03/20/2024)  Depression (PHQ2-9): Low Risk  (02/24/2024)  Financial Resource Strain: Low Risk  (12/08/2023)   Received from [facility]  Social Connections: Moderately Isolated (03/20/2024)  Tobacco Use: Medium Risk (03/18/2024)    Readmission Risk Interventions     No data to display

## 2024-03-31 NOTE — Progress Notes (Signed)
 Pt's oxygen has increased the last 4 hours and her Sp02 has been decreasing to the low 80's.  RN called and stated she was on 7L and was 84%.  Because of pt's disease it was this RRT's choice to place the pt on a heated high flow nasal cannula.  02 is set at 85% and 60L. With her Sp02 88-92%.  Pt has a congested cough so I gave her a green acapella and educated her and her family on how this might help the pt bring up her secretions.  Pt does have increased WOB at this time

## 2024-03-31 NOTE — Progress Notes (Signed)
 [facility]  ROUNDING NOTE   Subjective:   Serum sodium improved. Serum sodium currently 130. Has been on 3% saline at 20 cc/h.  Objective:  Vital signs in last 24 hours:  Temp:  [97.6 F (36.4 C)-98.6 F (37 C)] 98.6 F (37 C) (11/27 0800) Pulse Rate:  [62-81] 74 (11/27 0952) Resp:  [11-21] 13 (11/27 0800) BP: (100-167)/(49-79) 118/55 (11/27 0952) SpO2:  [90 %-98 %] 94 % (11/27 0800) Weight:  [83.8 kg] 83.8 kg (11/27 0500)  Weight change: 1 kg Filed Weights   03/29/24 1045 03/30/24 0500 03/31/24 0500  Weight: 82.8 kg 82.3 kg 83.8 kg    Intake/Output: I/O last 3 completed shifts: In: 1611.4 [P.O.:240; I.V.:1371.4] Out: 1800 [Urine:1800]   Intake/Output this shift:  Total I/O In: 38.8 [I.V.:38.8] Out: -   Physical Exam: General: NAD  Head: Normocephalic, atraumatic. Moist oral mucosal membranes  Eyes: Anicteric  Lungs:  Clear to auscultation, normal effort  Heart: Regular rate and rhythm  Abdomen:  Soft, nontender  Extremities: 2-3+ peripheral edema.  Neurologic: Awake, alert  Skin: Warm,dry, no rash       Basic Metabolic Panel: Recent Labs  Lab 03/25/24 0450 03/26/24 0755 03/27/24 0400 03/28/24 0520 03/29/24 0159 03/29/24 0321 03/29/24 1407 03/30/24 0438 03/30/24 0642 03/30/24 1747 03/30/24 2221 03/31/24 0221 03/31/24 0554 03/31/24 1012  NA 128* 126*   < > 120* 118* 117*   < > 125*   < > 127* 127* 129* 130* 130*  K 4.2 4.1   < > 4.2 4.4 4.3  --  4.8  --   --   --   --  4.2  --   CL 93* 91*   < > 86* 82* 81*  --  90*  --   --   --   --  96*  --   CO2 28 28   < > 27 29 29   --  25  --   --   --   --  27  --   GLUCOSE 120* 110*   < > 116* 117* 123*  --  114*  --   --   --   --  114*  --   BUN 10 15   < > 15 12 11   --  12  --   --   --   --  12  --   CREATININE 0.53 0.54   < > 0.44 0.39* 0.38*  --  0.45  --   --   --   --  0.46  --   CALCIUM 8.4* 8.6*   < > 8.5* 8.6* 8.6*  --  8.8*  --   --   --   --  8.4*  --   MG  --   --   --   --    --   --   --  2.0  --   --   --   --   --   --   PHOS 2.9 3.1  --   --   --   --   --  3.7  --   --   --   --  3.7  --    < > = values in this interval not displayed.    Liver Function Tests: Recent Labs  Lab 03/27/24 0400 03/29/24 0159 03/30/24 0438 03/31/24 0554  AST 22 25  --   --   ALT 15 18  --   --  ALKPHOS 200* 185*  --   --   BILITOT 0.3 0.3  --   --   PROT 6.1* 6.2*  --   --   ALBUMIN  2.8* 2.8* 2.7* 2.6*   No results for input(s): LIPASE, AMYLASE in the last 168 hours. No results for input(s): AMMONIA in the last 168 hours.  CBC: Recent Labs  Lab 03/27/24 0400 03/28/24 0520 03/29/24 0321 03/30/24 0439 03/31/24 0553  WBC 16.0* 17.5* 18.7* 16.9* 19.2*  HGB 8.7* 9.1* 9.1* 9.5* 8.2*  HCT 27.4* 29.4* 28.8* 30.9* 26.7*  MCV 77.4* 77.8* 76.2* 78.6* 80.7  PLT 426* 448* 513* 463* 467*    Cardiac Enzymes: No results for input(s): CKTOTAL, CKMB, CKMBINDEX, TROPONINI in the last 168 hours.  BNP: Invalid input(s): POCBNP  CBG: Recent Labs  Lab 03/29/24 1038 03/30/24 1631  GLUCAP 154* 130*    Microbiology: Results for orders placed or performed during the hospital encounter of 03/18/24  Blood Culture (routine x 2)     Status: None   Collection Time: 03/18/24  6:00 PM   Specimen: BLOOD  Result Value Ref Range Status   Specimen Description BLOOD RIGHT ANTECUBITAL  Final   Special Requests   Final    BOTTLES DRAWN AEROBIC AND ANAEROBIC Blood Culture results may not be optimal due to an inadequate volume of blood received in culture bottles   Culture   Final    NO GROWTH 5 DAYS Performed at [facility], [address] [place], [place] [address]    Report Status 03/23/2024 FINAL  Final  Blood Culture (routine x 2)     Status: None   Collection Time: 03/18/24  6:05 PM   Specimen: BLOOD  Result Value Ref Range Status   Specimen Description BLOOD BLOOD LEFT HAND  Final   Special Requests   Final    BOTTLES DRAWN AEROBIC AND  ANAEROBIC Blood Culture results may not be optimal due to an inadequate volume of blood received in culture bottles   Culture   Final    NO GROWTH 5 DAYS Performed at [facility], [address] [place], [place] [address]    Report Status 03/23/2024 FINAL  Final  MRSA Next Gen by PCR, Nasal     Status: None   Collection Time: 03/19/24  5:04 AM   Specimen: Anterior Nasal Swab  Result Value Ref Range Status   MRSA by PCR Next Gen NOT DETECTED NOT DETECTED Final    Comment: (NOTE) The GeneXpert MRSA Assay (FDA approved for NASAL specimens only), is one component of a comprehensive MRSA colonization surveillance program. It is not intended to diagnose MRSA infection nor to guide or monitor treatment for MRSA infections. Test performance is not FDA approved in patients less than 21 years old. Performed at [facility], [address] [place], [place] [address]   Resp panel by RT-PCR (RSV, Flu A&B, Covid) Anterior Nasal Swab     Status: None   Collection Time: 03/19/24  5:04 AM   Specimen: Anterior Nasal Swab  Result Value Ref Range Status   SARS Coronavirus 2 by RT PCR NEGATIVE NEGATIVE Final    Comment: (NOTE) SARS-CoV-2 target nucleic acids are NOT DETECTED.  The SARS-CoV-2 RNA is generally detectable in upper respiratory specimens during the acute phase of infection. The lowest concentration of SARS-CoV-2 viral copies this assay can detect is 138 copies/mL. A negative result does not preclude SARS-Cov-2 infection and should not be used as the sole basis for treatment  or other patient management decisions. A negative result may occur with  improper specimen collection/handling, submission of specimen other than nasopharyngeal swab, presence of viral mutation(s) within the areas targeted by this assay, and inadequate number of viral copies(<138 copies/mL). A negative result must be combined with clinical observations, patient history, and  epidemiological information. The expected result is Negative.  Fact Sheet for Patients:  bloggercourse.com  Fact Sheet for Healthcare Providers:  seriousbroker.it  This test is no t yet approved or cleared by the [place]  FDA and  has been authorized for detection and/or diagnosis of SARS-CoV-2 by FDA under an Emergency Use Authorization (EUA). This EUA will remain  in effect (meaning this test can be used) for the duration of the COVID-19 declaration under Section 564(b)(1) of the Act, 21 U.S.C.section 360bbb-3(b)(1), unless the authorization is terminated  or revoked sooner.       Influenza A by PCR NEGATIVE NEGATIVE Final   Influenza B by PCR NEGATIVE NEGATIVE Final    Comment: (NOTE) The Xpert Xpress SARS-CoV-2/FLU/RSV plus assay is intended as an aid in the diagnosis of influenza from Nasopharyngeal swab specimens and should not be used as a sole basis for treatment. Nasal washings and aspirates are unacceptable for Xpert Xpress SARS-CoV-2/FLU/RSV testing.  Fact Sheet for Patients: bloggercourse.com  Fact Sheet for Healthcare Providers: seriousbroker.it  This test is not yet approved or cleared by the [place]  FDA and has been authorized for detection and/or diagnosis of SARS-CoV-2 by FDA under an Emergency Use Authorization (EUA). This EUA will remain in effect (meaning this test can be used) for the duration of the COVID-19 declaration under Section 564(b)(1) of the Act, 21 U.S.C. section 360bbb-3(b)(1), unless the authorization is terminated or revoked.     Resp Syncytial Virus by PCR NEGATIVE NEGATIVE Final    Comment: (NOTE) Fact Sheet for Patients: bloggercourse.com  Fact Sheet for Healthcare Providers: seriousbroker.it  This test is not yet approved or cleared by the [place]  FDA and has been  authorized for detection and/or diagnosis of SARS-CoV-2 by FDA under an Emergency Use Authorization (EUA). This EUA will remain in effect (meaning this test can be used) for the duration of the COVID-19 declaration under Section 564(b)(1) of the Act, 21 U.S.C. section 360bbb-3(b)(1), unless the authorization is terminated or revoked.  Performed at [facility], [address] [place], [place] [address]   Respiratory (~20 pathogens) panel by PCR     Status: None   Collection Time: 03/21/24  9:37 AM   Specimen: Nasopharyngeal Swab; Respiratory  Result Value Ref Range Status   Adenovirus NOT DETECTED NOT DETECTED Final   Coronavirus 229E NOT DETECTED NOT DETECTED Final    Comment: (NOTE) The Coronavirus on the Respiratory Panel, DOES NOT test for the novel  Coronavirus (2019 nCoV)    Coronavirus HKU1 NOT DETECTED NOT DETECTED Final   Coronavirus NL63 NOT DETECTED NOT DETECTED Final   Coronavirus OC43 NOT DETECTED NOT DETECTED Final   Metapneumovirus NOT DETECTED NOT DETECTED Final   Rhinovirus / Enterovirus NOT DETECTED NOT DETECTED Final   Influenza A NOT DETECTED NOT DETECTED Final   Influenza B NOT DETECTED NOT DETECTED Final   Parainfluenza Virus 1 NOT DETECTED NOT DETECTED Final   Parainfluenza Virus 2 NOT DETECTED NOT DETECTED Final   Parainfluenza Virus 3 NOT DETECTED NOT DETECTED Final   Parainfluenza Virus 4 NOT DETECTED NOT DETECTED Final   Respiratory Syncytial Virus NOT DETECTED NOT DETECTED Final   Bordetella pertussis NOT DETECTED  NOT DETECTED Final   Bordetella Parapertussis NOT DETECTED NOT DETECTED Final   Chlamydophila pneumoniae NOT DETECTED NOT DETECTED Final   Mycoplasma pneumoniae NOT DETECTED NOT DETECTED Final    Comment: Performed at [facility], [address] [place], [place] [address]    Coagulation Studies: No results for input(s): LABPROT, INR in the last 72 hours.  Urinalysis: No results for input(s): COLORURINE,  LABSPEC, PHURINE, GLUCOSEU, HGBUR, BILIRUBINUR, KETONESUR, PROTEINUR, UROBILINOGEN, NITRITE, LEUKOCYTESUR in the last 72 hours.  Invalid input(s): APPERANCEUR    Imaging: DG Chest Port 1 View Result Date: 03/29/2024 EXAM: 1 VIEW(S) XRAY OF THE CHEST 03/29/2024 04:00:00 PM COMPARISON: 03/26/2024 CLINICAL HISTORY: Shortness of breath FINDINGS: LINES, TUBES AND DEVICES: Right Port-A-Cath in place, stable. LUNGS AND PLEURA: Low lung volumes. Left retrocardiac atelectasis/consolidation, stable. Mild diffuse interstitial prominence. Small left pleural effusion. No pneumothorax. HEART AND MEDIASTINUM: No acute abnormality of the cardiac and mediastinal silhouettes. BONES AND SOFT TISSUES: No acute osseous abnormality. IMPRESSION: 1. Left retrocardiac atelectasis/consolidation, stable. 2. Small left pleural effusion. 3. Mild diffuse interstitial prominence. Electronically signed by: Norleen Boxer MD 03/29/2024 04:34 PM EST RP Workstation: [id number]     Medications:    sodium chloride  (hypertonic) 20 mL/hr at 03/31/24 0900    ALPRAZolam   0.5 mg Oral QHS   amiodarone   200 mg Oral Daily   apixaban   10 mg Oral BID   Followed by   NOREEN ON 04/01/2024] apixaban   5 mg Oral BID   vitamin C   500 mg Oral BID   Chlorhexidine  Gluconate Cloth  6 each Topical Daily   dextromethorphan -guaiFENesin   1 tablet Oral BID   DULoxetine   30 mg Oral Daily   magic mouthwash w/lidocaine   5 mL Oral QID   metoprolol  tartrate  25 mg Oral BID   multivitamin with minerals  1 tablet Oral Daily   Ensure Max Protein  11 oz Oral BID   acetaminophen , albuterol , alum & mag hydroxide-simeth, artificial tears, bisacodyl , docusate sodium , hydrALAZINE , HYDROmorphone  (DILAUDID ) injection, lip balm, menthol , ondansetron  (ZOFRAN ) IV, mouth rinse, polyethylene glycol, simethicone , sodium chloride  flush, traMADol   Assessment/ Plan:  Ms. Hannah Yu is a 78 y.o.  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  Treatment with Furosemide , salt tabs and Tolvaptan  unsuccessful previously. Recent history of lung cancer with chemo treatments. Serum sodium much improved with 3% saline.  Serum sodium up to 130 this a.m.  Discontinue 3% saline.  2. Acute saddle pulmonary embolism, confirmed on ultrasound, presence of right posterior tibial and peroneal vein. IR consulted but decline intervention due to brain metastasis. Currently on Eliquis .      LOS: 13 Aaban Griep 11/27/202510:42 AM

## 2024-04-01 DIAGNOSIS — I2602 Saddle embolus of pulmonary artery with acute cor pulmonale: Secondary | ICD-10-CM | POA: Diagnosis not present

## 2024-04-01 LAB — SODIUM
Sodium: 130 mmol/L — ABNORMAL LOW (ref 135–145)
Sodium: 133 mmol/L — ABNORMAL LOW (ref 135–145)
Sodium: 132 mmol/L — ABNORMAL LOW (ref 135–145)
Sodium: 132 mmol/L — ABNORMAL LOW (ref 135–145)
Sodium: 132 mmol/L — ABNORMAL LOW (ref 135–145)

## 2024-04-01 LAB — CBC
HCT: 26.2 % — ABNORMAL LOW (ref 36.0–46.0)
Hemoglobin: 8.1 g/dL — ABNORMAL LOW (ref 12.0–15.0)
MCH: 25 pg — ABNORMAL LOW (ref 26.0–34.0)
MCHC: 30.9 g/dL (ref 30.0–36.0)
MCV: 80.9 fL (ref 80.0–100.0)
Platelets: 467 K/uL — ABNORMAL HIGH (ref 150–400)
RBC: 3.24 MIL/uL — ABNORMAL LOW (ref 3.87–5.11)
RDW: 22.8 % — ABNORMAL HIGH (ref 11.5–15.5)
WBC: 31.5 K/uL — ABNORMAL HIGH (ref 4.0–10.5)
nRBC: 0 % (ref 0.0–0.2)

## 2024-04-01 LAB — RENAL FUNCTION PANEL
Albumin: 2.8 g/dL — ABNORMAL LOW (ref 3.5–5.0)
Anion gap: 10 (ref 5–15)
BUN: 14 mg/dL (ref 8–23)
CO2: 29 mmol/L (ref 22–32)
Calcium: 8.9 mg/dL (ref 8.9–10.3)
Chloride: 94 mmol/L — ABNORMAL LOW (ref 98–111)
Creatinine, Ser: 0.62 mg/dL (ref 0.44–1.00)
GFR, Estimated: >60 mL/min (ref >60)
Glucose, Bld: 126 mg/dL — ABNORMAL HIGH (ref 70–99)
Phosphorus: 4.2 mg/dL (ref 2.5–4.6)
Potassium: 4.5 mmol/L (ref 3.5–5.1)
Sodium: 132 mmol/L — ABNORMAL LOW (ref 135–145)

## 2024-04-01 LAB — PROCALCITONIN: Procalcitonin: 1.66 ng/mL

## 2024-04-01 NOTE — Plan of Care (Signed)
  Problem: Education: Goal: Knowledge of General Education information will improve Description: Including pain rating scale, medication(s)/side effects and non-pharmacologic comfort measures Outcome: Progressing   Problem: Health Behavior/Discharge Planning: Goal: Ability to manage health-related needs will improve Outcome: Progressing   Problem: Clinical Measurements: Goal: Ability to maintain clinical measurements within normal limits will improve Outcome: Progressing Goal: Will remain free from infection Outcome: Progressing Goal: Diagnostic test results will improve Outcome: Progressing Goal: Respiratory complications will improve Outcome: Progressing Goal: Cardiovascular complication will be avoided Outcome: Progressing   Problem: Activity: Goal: Risk for activity intolerance will decrease Outcome: Progressing   Problem: Nutrition: Goal: Adequate nutrition will be maintained Outcome: Progressing   Problem: Coping: Goal: Level of anxiety will decrease Outcome: Progressing   Problem: Elimination: Goal: Will not experience complications related to bowel motility Outcome: Progressing Goal: Will not experience complications related to urinary retention Outcome: Progressing   Problem: Pain Managment: Goal: General experience of comfort will improve and/or be controlled Outcome: Progressing   Problem: Safety: Goal: Ability to remain free from injury will improve Outcome: Progressing   Problem: Skin Integrity: Goal: Risk for impaired skin integrity will decrease Outcome: Progressing   Problem: Education: Goal: Knowledge of disease or condition will improve Outcome: Progressing Goal: Understanding of medication regimen will improve Outcome: Progressing Goal: Individualized Educational Video(s) Outcome: Progressing   Problem: Activity: Goal: Ability to tolerate increased activity will improve Outcome: Progressing   Problem: Cardiac: Goal: Ability to achieve  and maintain adequate cardiopulmonary perfusion will improve Outcome: Progressing   Problem: Health Behavior/Discharge Planning: Goal: Ability to safely manage health-related needs after discharge will improve Outcome: Progressing   Problem: Education: Goal: Knowledge of the prescribed therapeutic regimen will improve Outcome: Progressing   Problem: Activity: Goal: Ability to implement measures to reduce episodes of fatigue will improve Outcome: Progressing   Problem: Bowel/Gastric: Goal: Will not experience complications related to bowel motility Outcome: Progressing   Problem: Coping: Goal: Ability to identify and develop effective coping behavior will improve Outcome: Progressing   Problem: Nutritional: Goal: Maintenance of adequate nutrition will improve Outcome: Progressing

## 2024-04-01 NOTE — Progress Notes (Signed)
 [facility]  ROUNDING NOTE   Subjective:   Patient seen sitting up in bed Family at bedside Partially completed breakfast tray at bedside More alert today  Sodium 133  Objective:  Vital signs in last 24 hours:  Temp:  [98.2 F (36.8 C)-99.8 F (37.7 C)] 98.2 F (36.8 C) (11/28 0800) Pulse Rate:  [65-96] 82 (11/28 1000) Resp:  [11-21] 16 (11/28 1000) BP: (97-165)/(42-113) 107/62 (11/28 1000) SpO2:  [88 %-98 %] 95 % (11/28 0814) FiO2 (%):  [83 %-95 %] 95 % (11/28 0814)  Weight change:  Filed Weights   03/29/24 1045 03/30/24 0500 03/31/24 0500  Weight: 82.8 kg 82.3 kg 83.8 kg    Intake/Output: I/O last 3 completed shifts: In: 320.1 [I.V.:320.1] Out: 1225 [Urine:1225]   Intake/Output this shift:  No intake/output data recorded.  Physical Exam: General: NAD  Head: Normocephalic, atraumatic. Moist oral mucosal membranes  Eyes: Anicteric  Lungs:  Clear to auscultation, normal effort  Heart: Regular rate and rhythm  Abdomen:  Soft, nontender  Extremities: 2-3+ peripheral edema.  Neurologic: Awake, alert  Skin: Warm,dry, no rash       Basic Metabolic Panel: Recent Labs  Lab 03/26/24 0755 03/27/24 0400 03/29/24 0159 03/29/24 0321 03/29/24 1407 03/30/24 0438 03/30/24 9357 03/31/24 0554 03/31/24 1012 03/31/24 1750 03/31/24 2134 04/01/24 0216 04/01/24 0438 04/01/24 0656  NA 126*   < > 118* 117*   < > 125*   < > 130*   < > 131* 130* 130* 132* 133*  K 4.1   < > 4.4 4.3  --  4.8  --  4.2  --   --   --   --  4.5  --   CL 91*   < > 82* 81*  --  90*  --  96*  --   --   --   --  94*  --   CO2 28   < > 29 29  --  25  --  27  --   --   --   --  29  --   GLUCOSE 110*   < > 117* 123*  --  114*  --  114*  --   --   --   --  126*  --   BUN 15   < > 12 11  --  12  --  12  --   --   --   --  14  --   CREATININE 0.54   < > 0.39* 0.38*  --  0.45  --  0.46  --   --   --   --  0.62  --   CALCIUM 8.6*   < > 8.6* 8.6*  --  8.8*  --  8.4*  --   --   --   --  8.9   --   MG  --   --   --   --   --  2.0  --   --   --   --   --   --   --   --   PHOS 3.1  --   --   --   --  3.7  --  3.7  --   --   --   --  4.2  --    < > = values in this interval not displayed.    Liver Function Tests: Recent Labs  Lab 03/27/24 0400 03/29/24 0159 03/30/24 9561 03/31/24 0554 04/01/24 9561  AST 22 25  --   --   --   ALT 15 18  --   --   --   ALKPHOS 200* 185*  --   --   --   BILITOT 0.3 0.3  --   --   --   PROT 6.1* 6.2*  --   --   --   ALBUMIN  2.8* 2.8* 2.7* 2.6* 2.8*   No results for input(s): LIPASE, AMYLASE in the last 168 hours. No results for input(s): AMMONIA in the last 168 hours.  CBC: Recent Labs  Lab 03/28/24 0520 03/29/24 0321 03/30/24 0439 03/31/24 0553 04/01/24 0438  WBC 17.5* 18.7* 16.9* 19.2* 31.5*  HGB 9.1* 9.1* 9.5* 8.2* 8.1*  HCT 29.4* 28.8* 30.9* 26.7* 26.2*  MCV 77.8* 76.2* 78.6* 80.7 80.9  PLT 448* 513* 463* 467* 467*    Cardiac Enzymes: No results for input(s): CKTOTAL, CKMB, CKMBINDEX, TROPONINI in the last 168 hours.  BNP: Invalid input(s): POCBNP  CBG: Recent Labs  Lab 03/29/24 1038 03/30/24 1631  GLUCAP 154* 130*    Microbiology: Results for orders placed or performed during the hospital encounter of 03/18/24  Blood Culture (routine x 2)     Status: None   Collection Time: 03/18/24  6:00 PM   Specimen: BLOOD  Result Value Ref Range Status   Specimen Description BLOOD RIGHT ANTECUBITAL  Final   Special Requests   Final    BOTTLES DRAWN AEROBIC AND ANAEROBIC Blood Culture results may not be optimal due to an inadequate volume of blood received in culture bottles   Culture   Final    NO GROWTH 5 DAYS Performed at [facility], [address] [place], [place] [address]    Report Status 03/23/2024 FINAL  Final  Blood Culture (routine x 2)     Status: None   Collection Time: 03/18/24  6:05 PM   Specimen: BLOOD  Result Value Ref Range Status   Specimen Description BLOOD BLOOD LEFT  HAND  Final   Special Requests   Final    BOTTLES DRAWN AEROBIC AND ANAEROBIC Blood Culture results may not be optimal due to an inadequate volume of blood received in culture bottles   Culture   Final    NO GROWTH 5 DAYS Performed at [facility], [address] [place], [place] [address]    Report Status 03/23/2024 FINAL  Final  MRSA Next Gen by PCR, Nasal     Status: None   Collection Time: 03/19/24  5:04 AM   Specimen: Anterior Nasal Swab  Result Value Ref Range Status   MRSA by PCR Next Gen NOT DETECTED NOT DETECTED Final    Comment: (NOTE) The GeneXpert MRSA Assay (FDA approved for NASAL specimens only), is one component of a comprehensive MRSA colonization surveillance program. It is not intended to diagnose MRSA infection nor to guide or monitor treatment for MRSA infections. Test performance is not FDA approved in patients less than 30 years old. Performed at [facility], [address] [place], [place] [address]   Resp panel by RT-PCR (RSV, Flu A&B, Covid) Anterior Nasal Swab     Status: None   Collection Time: 03/19/24  5:04 AM   Specimen: Anterior Nasal Swab  Result Value Ref Range Status   SARS Coronavirus 2 by RT PCR NEGATIVE NEGATIVE Final    Comment: (NOTE) SARS-CoV-2 target nucleic acids are NOT DETECTED.  The SARS-CoV-2 RNA is generally detectable in upper respiratory specimens during the acute  phase of infection. The lowest concentration of SARS-CoV-2 viral copies this assay can detect is 138 copies/mL. A negative result does not preclude SARS-Cov-2 infection and should not be used as the sole basis for treatment or other patient management decisions. A negative result may occur with  improper specimen collection/handling, submission of specimen other than nasopharyngeal swab, presence of viral mutation(s) within the areas targeted by this assay, and inadequate number of viral copies(<138 copies/mL). A negative result must be combined  with clinical observations, patient history, and epidemiological information. The expected result is Negative.  Fact Sheet for Patients:  bloggercourse.com  Fact Sheet for Healthcare Providers:  seriousbroker.it  This test is no t yet approved or cleared by the [place]  FDA and  has been authorized for detection and/or diagnosis of SARS-CoV-2 by FDA under an Emergency Use Authorization (EUA). This EUA will remain  in effect (meaning this test can be used) for the duration of the COVID-19 declaration under Section 564(b)(1) of the Act, 21 U.S.C.section 360bbb-3(b)(1), unless the authorization is terminated  or revoked sooner.       Influenza A by PCR NEGATIVE NEGATIVE Final   Influenza B by PCR NEGATIVE NEGATIVE Final    Comment: (NOTE) The Xpert Xpress SARS-CoV-2/FLU/RSV plus assay is intended as an aid in the diagnosis of influenza from Nasopharyngeal swab specimens and should not be used as a sole basis for treatment. Nasal washings and aspirates are unacceptable for Xpert Xpress SARS-CoV-2/FLU/RSV testing.  Fact Sheet for Patients: bloggercourse.com  Fact Sheet for Healthcare Providers: seriousbroker.it  This test is not yet approved or cleared by the [place]  FDA and has been authorized for detection and/or diagnosis of SARS-CoV-2 by FDA under an Emergency Use Authorization (EUA). This EUA will remain in effect (meaning this test can be used) for the duration of the COVID-19 declaration under Section 564(b)(1) of the Act, 21 U.S.C. section 360bbb-3(b)(1), unless the authorization is terminated or revoked.     Resp Syncytial Virus by PCR NEGATIVE NEGATIVE Final    Comment: (NOTE) Fact Sheet for Patients: bloggercourse.com  Fact Sheet for Healthcare Providers: seriousbroker.it  This test is not yet approved  or cleared by the [place]  FDA and has been authorized for detection and/or diagnosis of SARS-CoV-2 by FDA under an Emergency Use Authorization (EUA). This EUA will remain in effect (meaning this test can be used) for the duration of the COVID-19 declaration under Section 564(b)(1) of the Act, 21 U.S.C. section 360bbb-3(b)(1), unless the authorization is terminated or revoked.  Performed at [facility], [address] [place], [place] [address]   Respiratory (~20 pathogens) panel by PCR     Status: None   Collection Time: 03/21/24  9:37 AM   Specimen: Nasopharyngeal Swab; Respiratory  Result Value Ref Range Status   Adenovirus NOT DETECTED NOT DETECTED Final   Coronavirus 229E NOT DETECTED NOT DETECTED Final    Comment: (NOTE) The Coronavirus on the Respiratory Panel, DOES NOT test for the novel  Coronavirus (2019 nCoV)    Coronavirus HKU1 NOT DETECTED NOT DETECTED Final   Coronavirus NL63 NOT DETECTED NOT DETECTED Final   Coronavirus OC43 NOT DETECTED NOT DETECTED Final   Metapneumovirus NOT DETECTED NOT DETECTED Final   Rhinovirus / Enterovirus NOT DETECTED NOT DETECTED Final   Influenza A NOT DETECTED NOT DETECTED Final   Influenza B NOT DETECTED NOT DETECTED Final   Parainfluenza Virus 1 NOT DETECTED NOT DETECTED Final   Parainfluenza Virus 2 NOT DETECTED NOT DETECTED Final  Parainfluenza Virus 3 NOT DETECTED NOT DETECTED Final   Parainfluenza Virus 4 NOT DETECTED NOT DETECTED Final   Respiratory Syncytial Virus NOT DETECTED NOT DETECTED Final   Bordetella pertussis NOT DETECTED NOT DETECTED Final   Bordetella Parapertussis NOT DETECTED NOT DETECTED Final   Chlamydophila pneumoniae NOT DETECTED NOT DETECTED Final   Mycoplasma pneumoniae NOT DETECTED NOT DETECTED Final    Comment: Performed at [facility], [address], [place], [place] [address]    Coagulation Studies: No results for input(s): LABPROT, INR in the last 72  hours.  Urinalysis: No results for input(s): COLORURINE, LABSPEC, PHURINE, GLUCOSEU, HGBUR, BILIRUBINUR, KETONESUR, PROTEINUR, UROBILINOGEN, NITRITE, LEUKOCYTESUR in the last 72 hours.  Invalid input(s): APPERANCEUR    Imaging: DG Chest Port 1 View Result Date: 03/31/2024 CLINICAL DATA:  Dyspnea EXAM: PORTABLE CHEST 1 VIEW COMPARISON:  03/29/2024 FINDINGS: Single frontal view of the chest demonstrates stable right chest wall port. Cardiac silhouette is enlarged but stable. Persistent bibasilar consolidation, left greater than right. Small left effusion is unchanged. No pneumothorax. IMPRESSION: 1. Stable bibasilar consolidation and left pleural effusion. Electronically Signed   By: Ozell Daring M.D.   On: 03/31/2024 15:44     Medications:      albuterol   2.5 mg Nebulization Q6H   ALPRAZolam   0.5 mg Oral QHS   amiodarone   200 mg Oral Daily   apixaban   5 mg Oral BID   vitamin C   500 mg Oral BID   Chlorhexidine  Gluconate Cloth  6 each Topical Daily   dextromethorphan -guaiFENesin   1 tablet Oral BID   DULoxetine   30 mg Oral Daily   magic mouthwash w/lidocaine   5 mL Oral QID   metoprolol  tartrate  25 mg Oral BID   multivitamin with minerals  1 tablet Oral Daily   Ensure Max Protein  11 oz Oral BID   acetaminophen , alum & mag hydroxide-simeth, artificial tears, benzonatate , bisacodyl , docusate sodium , hydrALAZINE , HYDROmorphone  (DILAUDID ) injection, lip balm, menthol , ondansetron  (ZOFRAN ) IV, mouth rinse, polyethylene glycol, simethicone , sodium chloride  flush, traMADol   Assessment/ Plan:  Ms. Hannah Yu is a 78 y.o.  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  Treatment with Furosemide , salt tabs and Tolvaptan  unsuccessful previously. Recent history of lung cancer with chemo treatments. S sodium 133 today. 3% hypertonic saline stopped on 11/27. Would hold salt tabs and encourage 40oz fluid restriction.   2. Acute saddle pulmonary embolism, confirmed on ultrasound, presence of right posterior tibial and peroneal vein. IR consulted but decline intervention due to brain metastasis. Currently on Eliquis .   Due to sodium correction, we will sign off for now. Feel free to reconsult if needed.    LOS: 14 [employer] 11/28/202510:11 AM

## 2024-04-01 NOTE — Progress Notes (Signed)
 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 Na level is labile. S/p D5W bolus, 3% saline, tolvaptan , salt tabs as per nephro. Continue fluid restriction as per nephro.  Nephro signed off 04/01/24    Acute metabolic encephalopathy: likely secondary to above. Hallucinating. Re-orient prn    Acute segmental PE: w/ US  shows nonocclusive thrombus in right posterior tibial and peroneal vein. IR consulted for acute segmental PE clot burden minimal and thrombolysis contraindicated due to brain metastasis. Continue on eliquis   CAP: completed abx course. Bronchodilators prn. Resp viral panel is neg    Acute hypoxic respiratory failure: likely multifactorial- PE, pneumonia, a.fib, pulmon HTN. Still on heated HFNC but able to wean oxygen down slightly as per pt's nurse. Still high risk for further decline    Atrial fibrillation: w/ RVR. Continue on amio, eliquis    Pulmon HTN: echo 03/19/24: EF 60 to 65%, grade I diastolic dysfunction, right ventricle size moderately enlarged, severely elevated pulmonary artery systolic pressures, severe tricuspid valve regurgitation.   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 Onco recs apprec. High risk for further decline   AKI: resolved  Microcytic anemia: iron is WNL.  No need for a transfusion currently   Thrombocytosis: likely reactive. Will continue to monitor    Generalized weakness: PT/OT recs SNF.  Pt does not want SNF. Considering going home w/ hospice if pt becomes stable enough to transfer safely home on [facility]        DVT prophylaxis: eliquis  Code Status: DNR Family Communication: discussed pt's care w/ pt's friend, Candis, at bedside and answered her questions Disposition Plan: PT/OT recs SNF. High risk for further decline.   Level of care: Stepdown Consultants:  Onco ICU Nephro   Procedures:  Antimicrobials:  Subjective: Pt is lethargic but denies any pain or shortness of breath   Objective: Vitals:   04/01/24 0500 04/01/24 0700 04/01/24 0800 04/01/24 0814  BP: (!) 115/52 (!) 120/55 (!) 136/51   Pulse: 84 83 83   Resp: 11 (!) 21 16   Temp:   98.2 F (36.8 C)   TempSrc:   Oral   SpO2:   98% 95%  Weight:      Height:        Intake/Output Summary (Last 24 hours) at 04/01/2024 0931 Last data filed at 04/01/2024 0500 Gross per 24 hour  Intake 43.69 ml  Output 1025 ml  Net -981.31 ml   Filed Weights   03/29/24 1045 03/30/24 0500 03/31/24 0500  Weight: 82.8 kg 82.3 kg 83.8 kg    Examination:  General exam: appears lethargic  Respiratory system: decreased breath sounds b/l  Cardiovascular system: S1 & S2+ Gastrointestinal system: abd is soft, NT, ND & hypoactive bowel sounds Central nervous system: lethargic  Psychiatry: judgement and insight appears not at baseline      Data Reviewed: I have personally reviewed following labs and imaging studies  CBC: Recent Labs  Lab 03/28/24 0520 03/29/24 0321  03/30/24 0439 03/31/24 0553 04/01/24 0438  WBC 17.5* 18.7* 16.9* 19.2* 31.5*  HGB 9.1* 9.1* 9.5* 8.2* 8.1*  HCT 29.4* 28.8* 30.9* 26.7* 26.2*  MCV 77.8* 76.2* 78.6* 80.7 80.9  PLT 448* 513* 463* 467* 467*   Basic Metabolic Panel: Recent Labs  Lab 03/26/24 0755 03/27/24 0400 03/29/24 0159 03/29/24 0321 03/29/24 1407 03/30/24 0438 03/30/24 9357 03/31/24 0554 03/31/24 1012 03/31/24 1750 03/31/24 2134 04/01/24 0216 04/01/24 0438  04/01/24 0656  NA 126*   < > 118* 117*   < > 125*   < > 130*   < > 131* 130* 130* 132* 133*  K 4.1   < > 4.4 4.3  --  4.8  --  4.2  --   --   --   --  4.5  --   CL 91*   < > 82* 81*  --  90*  --  96*  --   --   --   --  94*  --   CO2 28   < > 29 29  --  25  --  27  --   --   --   --  29  --   GLUCOSE 110*   < > 117* 123*  --  114*  --  114*  --   --   --   --  126*  --   BUN 15   < > 12 11  --  12  --  12  --   --   --   --  14  --   CREATININE 0.54   < > 0.39* 0.38*  --  0.45  --  0.46  --   --   --   --  0.62  --   CALCIUM 8.6*   < > 8.6* 8.6*  --  8.8*  --  8.4*  --   --   --   --  8.9  --   MG  --   --   --   --   --  2.0  --   --   --   --   --   --   --   --   PHOS 3.1  --   --   --   --  3.7  --  3.7  --   --   --   --  4.2  --    < > = values in this interval not displayed.   GFR: Estimated Creatinine Clearance: 54.7 mL/min (by C-G formula based on SCr of 0.62 mg/dL). Liver Function Tests: Recent Labs  Lab 03/27/24 0400 03/29/24 0159 03/30/24 0438 03/31/24 0554 04/01/24 0438  AST 22 25  --   --   --   ALT 15 18  --   --   --   ALKPHOS 200* 185*  --   --   --   BILITOT 0.3 0.3  --   --   --   PROT 6.1* 6.2*  --   --   --   ALBUMIN  2.8* 2.8* 2.7* 2.6* 2.8*   No results for input(s): LIPASE, AMYLASE in the last 168 hours. No results for input(s): AMMONIA in the last 168 hours. Coagulation Profile: No results for input(s): INR, PROTIME in the last 168 hours. Cardiac Enzymes: No results for input(s): CKTOTAL, CKMB, CKMBINDEX, TROPONINI in the last 168 hours. BNP (last 3 results) Recent Labs    03/18/24 1850 03/20/24 1525 03/21/24 1941  PROBNP 26,777.0* 12,988.0* 14,216.0*   HbA1C: No results for input(s): HGBA1C in the last 72 hours. CBG: Recent Labs  Lab 03/29/24 1038 03/30/24 1631  GLUCAP 154* 130*   Lipid Profile: No results for input(s): CHOL, HDL, LDLCALC, TRIG, CHOLHDL, LDLDIRECT in the last 72 hours. Thyroid  Function  Tests: No results for input(s): TSH, T4TOTAL, FREET4, T3FREE, THYROIDAB in the last 72 hours. Anemia Panel: No results for input(s): VITAMINB12, FOLATE, FERRITIN, TIBC, IRON, RETICCTPCT in the last 72 hours. Sepsis Labs: No results for input(s): PROCALCITON, LATICACIDVEN in the last 168 hours.  No results found for this or any previous visit (from the past 240 hours).        Radiology Studies: DG Chest Port 1 View Result Date: 03/31/2024 CLINICAL DATA:  Dyspnea EXAM: PORTABLE CHEST 1 VIEW COMPARISON:  03/29/2024 FINDINGS: Single frontal view of the chest demonstrates stable right chest wall port. Cardiac silhouette is enlarged but stable. Persistent bibasilar consolidation, left greater than right. Small left effusion is unchanged. No pneumothorax. IMPRESSION: 1. Stable bibasilar consolidation and left pleural effusion. Electronically Signed   By: Ozell Daring M.D.   On: 03/31/2024 15:44        Scheduled Meds:  albuterol   2.5 mg Nebulization Q6H   ALPRAZolam   0.5 mg Oral QHS   amiodarone   200 mg Oral Daily   apixaban   5 mg Oral BID   vitamin C   500 mg Oral BID   Chlorhexidine  Gluconate Cloth  6 each Topical Daily   dextromethorphan -guaiFENesin   1 tablet Oral BID   DULoxetine   30 mg Oral Daily   magic mouthwash w/lidocaine   5 mL Oral QID   metoprolol  tartrate  25 mg Oral BID   multivitamin with minerals  1 tablet Oral Daily   Ensure Max Protein  11 oz Oral BID   Continuous Infusions:     LOS: 14 days        Anthony CHRISTELLA Pouch, MD [facility] Pager [phone]  If 7PM-7AM, please contact night-coverage [email or web address] Password [facility] 04/01/2024, 9:31 AM

## 2024-04-01 NOTE — Progress Notes (Signed)
 [facility] ICU 16 [facility] Liaison RN note Received request from [facility] for hospice services at home after discharge. Spoke with patient's family at bedside to initiate education related to hospice philosophy, services, and team approach to care. Patient/family verbalized understanding of information given.   Per discussion, the plan is for discharge home by EMS likely not before next week.  DME needs discussed. Patient has the following equipment in the home. Hospital bed and oxygen.  Patient/family requests the following equipment for deliver: Overbed table, additional oxygen concentrator, BSC and wheelchair. Address has been verified and is correct in the chart. Charlie is the family contact to arrange time of equipment delivery.   Please send signed and completed DNR with patient/family. Please provide prescriptions at discharge as needed for ongoing symptom management.   [facility] information and contact numbers given to [employer].   Please call with any hospice related questions or concerns.  Thank you for the opportunity to participate in this patient's care.  Hunter Seip, CHARITY FUNDRAISER, BSN [facility] Liaison [phone]

## 2024-04-02 ENCOUNTER — Inpatient Hospital Stay

## 2024-04-02 DIAGNOSIS — R627 Adult failure to thrive: Secondary | ICD-10-CM

## 2024-04-02 LAB — CBC
HCT: 23.8 % — ABNORMAL LOW (ref 36.0–46.0)
Hemoglobin: 7.1 g/dL — ABNORMAL LOW (ref 12.0–15.0)
MCH: 24.8 pg — ABNORMAL LOW (ref 26.0–34.0)
MCHC: 29.8 g/dL — ABNORMAL LOW (ref 30.0–36.0)
MCV: 83.2 fL (ref 80.0–100.0)
Platelets: 433 K/uL — ABNORMAL HIGH (ref 150–400)
RBC: 2.86 MIL/uL — ABNORMAL LOW (ref 3.87–5.11)
RDW: 22.6 % — ABNORMAL HIGH (ref 11.5–15.5)
WBC: 25.6 K/uL — ABNORMAL HIGH (ref 4.0–10.5)
nRBC: 0 % (ref 0.0–0.2)

## 2024-04-02 NOTE — Plan of Care (Signed)
  Problem: Education: Goal: Knowledge of General Education information will improve Description: Including pain rating scale, medication(s)/side effects and non-pharmacologic comfort measures Outcome: Progressing   Problem: Health Behavior/Discharge Planning: Goal: Ability to manage health-related needs will improve Outcome: Progressing   Problem: Clinical Measurements: Goal: Ability to maintain clinical measurements within normal limits will improve Outcome: Progressing Goal: Will remain free from infection Outcome: Progressing Goal: Diagnostic test results will improve Outcome: Progressing Goal: Respiratory complications will improve Outcome: Progressing Goal: Cardiovascular complication will be avoided Outcome: Progressing   Problem: Activity: Goal: Risk for activity intolerance will decrease Outcome: Progressing   Problem: Nutrition: Goal: Adequate nutrition will be maintained Outcome: Progressing   Problem: Coping: Goal: Level of anxiety will decrease Outcome: Progressing   Problem: Elimination: Goal: Will not experience complications related to bowel motility Outcome: Progressing Goal: Will not experience complications related to urinary retention Outcome: Progressing   Problem: Pain Managment: Goal: General experience of comfort will improve and/or be controlled Outcome: Progressing   Problem: Safety: Goal: Ability to remain free from injury will improve Outcome: Progressing   Problem: Skin Integrity: Goal: Risk for impaired skin integrity will decrease Outcome: Progressing   Problem: Education: Goal: Knowledge of disease or condition will improve Outcome: Progressing Goal: Understanding of medication regimen will improve Outcome: Progressing Goal: Individualized Educational Video(s) Outcome: Progressing   Problem: Activity: Goal: Ability to tolerate increased activity will improve Outcome: Progressing   Problem: Cardiac: Goal: Ability to achieve  and maintain adequate cardiopulmonary perfusion will improve Outcome: Progressing   Problem: Health Behavior/Discharge Planning: Goal: Ability to safely manage health-related needs after discharge will improve Outcome: Progressing   Problem: Education: Goal: Knowledge of the prescribed therapeutic regimen will improve Outcome: Progressing   Problem: Activity: Goal: Ability to implement measures to reduce episodes of fatigue will improve Outcome: Progressing   Problem: Bowel/Gastric: Goal: Will not experience complications related to bowel motility Outcome: Progressing   Problem: Coping: Goal: Ability to identify and develop effective coping behavior will improve Outcome: Progressing   Problem: Nutritional: Goal: Maintenance of adequate nutrition will improve Outcome: Progressing

## 2024-04-02 NOTE — Progress Notes (Signed)
   04/02/24 1700  Spiritual Encounters  Type of Visit Initial  Care provided to: Pt and family  Referral source Nurse (RN/NT/LPN)  Reason for visit End-of-life  OnCall Visit Yes   Chaplain responded to consult. Chaplain provided support to patient's brother Hannah Yu. He displays sadness and frustration watching his sister deteriorate. Chaplain spoke to patient, but unresponsive at this time. Chaplain arranged for a Cathern to come give last rites as requested by patient's brother. Chaplain left messages with Father Velinda and Father Marinell. Chaplain is unsure of timing since its the weekend. Chaplain communicated this to the patient's brother. Chaplain is available for follow up as needed.

## 2024-04-02 NOTE — Progress Notes (Addendum)
 1114 AM: The hospitalist has been informed that the family is worried that the patient might be getting constipated as she keeps holding her stomach cramping. PRN Miralax  and a suppository has been administered. The MD has order a KUB as the patient's hemoglobin is currently 7.1 this morning and no bleeding has been noticed or reported. The patient continues to require PRN pain medicine for pain control.    1003 AM: MD has been notified: I gave the patient 1mg  of Dilaudid  IV at [phone] for patient and reposition her. RT also during this time tried to adjust the HHFNC settings to 35% FiO2, 35 liters but the patient started to desat down to 73% and had the settings for the [facility] had to be set to 45% FiO2 on 40 liters of oxygen. Currently her oxygen sat is 98%. I mention to the family that maybe we could try to wean her oxygen requirement again later since she became drowsy with the pain medication.

## 2024-04-02 NOTE — Progress Notes (Signed)
 PROGRESS NOTE    Hannah Yu  FMW:[id number] DOB: June 03, 1945 DOA: 03/18/2024 PCP: Epifanio Alm SQUIBB, MD   Assessment & Plan:   Principal Problem:   Acute saddle pulmonary embolism (HCC) Active Problems:   Acute respiratory failure with hypoxia (HCC)   Acute pulmonary embolism without acute cor pulmonale (HCC)   Severe sepsis (HCC)   Mild pulmonic regurgitation and RV dysfunction by prior echocardiogram   AKI (acute kidney injury)   Palliative care encounter  Assessment and Plan: Failure to thrive: secondary to all below. High risk for further decline. Poor prognosis. Pt's MPOA does not want comfort care only currently but wants continue to treat the treatable.   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 Na level is labile. S/p D5W bolus, 3% saline, tolvaptan , salt tabs as per nephro. Continue fluid restriction as per nephro.  Nephro signed off 04/01/24    Acute metabolic encephalopathy: likely secondary to above. Hallucinating. Re-orient prn    Acute segmental PE: w/ US  shows nonocclusive thrombus in right posterior tibial and peroneal vein. IR consulted for acute segmental PE clot burden minimal and thrombolysis contraindicated due to brain metastasis. Continue on eliquis    CAP: completed abx course. Spiking fevers, unclear etiology. Repeat blood cxs pending. CXR on 11/27 is unchanged from prior.  Bronchodilators prn. Resp viral panel is neg    Acute hypoxic respiratory failure: likely multifactorial- PE, pneumonia, a.fib, pulmon HTN. Still on heated high flow and unable to wean down significantly secondary to desaturations. Still high risk for further decline    Atrial fibrillation: w/ RVR. Continue on amio, eliquis    Pulmon HTN: echo 03/19/24: EF 60 to 65%, grade I diastolic dysfunction, right ventricle size moderately enlarged, severely elevated pulmonary artery systolic pressures, severe tricuspid valve regurgitation.   Metastatic  adenocarcinoma : w/ mets to bone and brain. Stage IV as per onco's notes. Poor po intake. Onco recs apprec. High risk for further decline   AKI: resolved  Microcytic anemia: iron is WNL.  No need for a transfusion currently   Thrombocytosis: likely reactive. Will continue to monitor    Generalized weakness: PT/OT recs SNF. Pt does not want SNF.        DVT prophylaxis: eliquis  Code Status: DNR Family Communication: discussed pt's care w/ pt's friend, Candis, at bedside and answered her questions Disposition Plan: PT/OT recs SNF. High risk for further decline. Poor prognosis    Level of care: Stepdown Consultants:  [facility] ICU Nephro   Procedures:  Antimicrobials:  Subjective: Pt denies any shortness of breath or pain but clinically looks very uncomfortable.   Objective: Vitals:   04/02/24 0821 04/02/24 0830 04/02/24 0900 04/02/24 0930  BP:      Pulse:  79  97  Resp:  12 13 18   Temp:   99.9 F (37.7 C)   TempSrc:   Axillary   SpO2: 93%     Weight:      Height:        Intake/Output Summary (Last 24 hours) at 04/02/2024 1013 Last data filed at 04/02/2024 0900 Gross per 24 hour  Intake 0 ml  Output 600 ml  Net -600 ml   Filed Weights   03/30/24 0500 03/31/24 0500 04/02/24 0500  Weight: 82.3 kg 83.8 kg 81.5 kg    Examination:  General exam: appears lethargic  Respiratory system: diminished breath sounds b/l  Cardiovascular system: S1/S2+. No rubs or clicks Gastrointestinal system: abd is soft, tender to palpation, obese, hypoactive bowel sounds  Central nervous system: lethargic  Psychiatry: judgement and insight appears not at baseline      Data Reviewed: I have personally reviewed following labs and imaging studies  CBC: Recent Labs  Lab 03/29/24 0321 03/30/24 0439 03/31/24 0553 04/01/24 0438 04/02/24 0406  WBC 18.7* 16.9* 19.2* 31.5* 25.6*  HGB 9.1* 9.5* 8.2* 8.1* 7.1*  HCT 28.8* 30.9* 26.7* 26.2* 23.8*  MCV 76.2* 78.6* 80.7 80.9 83.2  PLT  513* 463* 467* 467* 433*   Basic Metabolic Panel: Recent Labs  Lab 03/29/24 0159 03/29/24 0321 03/29/24 1407 03/30/24 0438 03/30/24 0642 03/31/24 0554 03/31/24 1012 04/01/24 0438 04/01/24 0656 04/01/24 0911 04/01/24 1321 04/01/24 1746  NA 118* 117*   < > 125*   < > 130*   < > 132* 133* 132* 132* 132*  K 4.4 4.3  --  4.8  --  4.2  --  4.5  --   --   --   --   CL 82* 81*  --  90*  --  96*  --  94*  --   --   --   --   CO2 29 29  --  25  --  27  --  29  --   --   --   --   GLUCOSE 117* 123*  --  114*  --  114*  --  126*  --   --   --   --   BUN 12 11  --  12  --  12  --  14  --   --   --   --   CREATININE 0.39* 0.38*  --  0.45  --  0.46  --  0.62  --   --   --   --   CALCIUM 8.6* 8.6*  --  8.8*  --  8.4*  --  8.9  --   --   --   --   MG  --   --   --  2.0  --   --   --   --   --   --   --   --   PHOS  --   --   --  3.7  --  3.7  --  4.2  --   --   --   --    < > = values in this interval not displayed.   GFR: Estimated Creatinine Clearance: 53.9 mL/min (by C-G formula based on SCr of 0.62 mg/dL). Liver Function Tests: Recent Labs  Lab 03/27/24 0400 03/29/24 0159 03/30/24 0438 03/31/24 0554 04/01/24 0438  AST 22 25  --   --   --   ALT 15 18  --   --   --   ALKPHOS 200* 185*  --   --   --   BILITOT 0.3 0.3  --   --   --   PROT 6.1* 6.2*  --   --   --   ALBUMIN  2.8* 2.8* 2.7* 2.6* 2.8*   No results for input(s): LIPASE, AMYLASE in the last 168 hours. No results for input(s): AMMONIA in the last 168 hours. Coagulation Profile: No results for input(s): INR, PROTIME in the last 168 hours. Cardiac Enzymes: No results for input(s): CKTOTAL, CKMB, CKMBINDEX, TROPONINI in the last 168 hours. BNP (last 3 results) Recent Labs    03/18/24 1850 03/20/24 1525 03/21/24 1941  PROBNP 26,777.0* 12,988.0* 14,216.0*   HbA1C: No results for input(s): HGBA1C in the last  72 hours. CBG: Recent Labs  Lab 03/29/24 1038 03/30/24 1631  GLUCAP 154* 130*   Lipid  Profile: No results for input(s): CHOL, HDL, LDLCALC, TRIG, CHOLHDL, LDLDIRECT in the last 72 hours. Thyroid  Function Tests: No results for input(s): TSH, T4TOTAL, FREET4, T3FREE, THYROIDAB in the last 72 hours. Anemia Panel: No results for input(s): VITAMINB12, FOLATE, FERRITIN, TIBC, IRON, RETICCTPCT in the last 72 hours. Sepsis Labs: Recent Labs  Lab 04/01/24 0952  PROCALCITON 1.66    No results found for this or any previous visit (from the past 240 hours).        Radiology Studies: DG Chest Port 1 View Result Date: 03/31/2024 CLINICAL DATA:  Dyspnea EXAM: PORTABLE CHEST 1 VIEW COMPARISON:  03/29/2024 FINDINGS: Single frontal view of the chest demonstrates stable right chest wall port. Cardiac silhouette is enlarged but stable. Persistent bibasilar consolidation, left greater than right. Small left effusion is unchanged. No pneumothorax. IMPRESSION: 1. Stable bibasilar consolidation and left pleural effusion. Electronically Signed   By: Ozell Daring M.D.   On: 03/31/2024 15:44        Scheduled Meds:  albuterol   2.5 mg Nebulization Q6H   ALPRAZolam   0.5 mg Oral QHS   amiodarone   200 mg Oral Daily   apixaban   5 mg Oral BID   vitamin C   500 mg Oral BID   Chlorhexidine  Gluconate Cloth  6 each Topical Daily   dextromethorphan -guaiFENesin   1 tablet Oral BID   DULoxetine   30 mg Oral Daily   magic mouthwash w/lidocaine   5 mL Oral QID   metoprolol  tartrate  25 mg Oral BID   multivitamin with minerals  1 tablet Oral Daily   Ensure Max Protein  11 oz Oral BID   Continuous Infusions:     LOS: 15 days        Anthony CHRISTELLA Pouch, MD [facility] Pager [phone]  If 7PM-7AM, please contact night-coverage [email or web address] Password [email or web address] 04/02/2024, 10:13 AM

## 2024-04-03 DIAGNOSIS — C3490 Malignant neoplasm of unspecified part of unspecified bronchus or lung: Secondary | ICD-10-CM

## 2024-04-03 DIAGNOSIS — C7931 Secondary malignant neoplasm of brain: Secondary | ICD-10-CM

## 2024-04-03 LAB — BASIC METABOLIC PANEL WITH GFR
Anion gap: 9 (ref 5–15)
BUN: 21 mg/dL (ref 8–23)
CO2: 29 mmol/L (ref 22–32)
Calcium: 9 mg/dL (ref 8.9–10.3)
Chloride: 98 mmol/L (ref 98–111)
Creatinine, Ser: 0.66 mg/dL (ref 0.44–1.00)
GFR, Estimated: >60 mL/min (ref >60)
Glucose, Bld: 119 mg/dL — ABNORMAL HIGH (ref 70–99)
Potassium: 4.6 mmol/L (ref 3.5–5.1)
Sodium: 136 mmol/L (ref 135–145)

## 2024-04-03 LAB — CBC
HCT: 24 % — ABNORMAL LOW (ref 36.0–46.0)
Hemoglobin: 7.1 g/dL — ABNORMAL LOW (ref 12.0–15.0)
MCH: 24.7 pg — ABNORMAL LOW (ref 26.0–34.0)
MCHC: 29.6 g/dL — ABNORMAL LOW (ref 30.0–36.0)
MCV: 83.3 fL (ref 80.0–100.0)
Platelets: 465 K/uL — ABNORMAL HIGH (ref 150–400)
RBC: 2.88 MIL/uL — ABNORMAL LOW (ref 3.87–5.11)
RDW: 22.2 % — ABNORMAL HIGH (ref 11.5–15.5)
WBC: 15.3 K/uL — ABNORMAL HIGH (ref 4.0–10.5)
nRBC: 0.3 % — ABNORMAL HIGH (ref 0.0–0.2)

## 2024-04-03 LAB — PREPARE RBC (CROSSMATCH)

## 2024-04-03 MED ORDER — SODIUM CHLORIDE 0.9 % IV SOLN: INTRAVENOUS | Status: DC

## 2024-04-03 MED ORDER — HYDROMORPHONE BOLUS VIA INFUSION: 1.0000 mg | INTRAVENOUS | Status: DC | PRN

## 2024-04-03 MED ORDER — SODIUM CHLORIDE 0.9% IV SOLUTION: Freq: Once | INTRAVENOUS | Status: DC

## 2024-04-03 MED ORDER — ALBUTEROL SULFATE (2.5 MG/3ML) 0.083% IN NEBU: 2.5000 mg | INHALATION_SOLUTION | Freq: Three times a day (TID) | RESPIRATORY_TRACT | Status: DC

## 2024-04-03 MED ORDER — LORAZEPAM 1 MG PO TABS: 1.0000 mg | ORAL_TABLET | ORAL | Status: DC | PRN

## 2024-04-03 MED ORDER — HYDROMORPHONE BOLUS VIA INFUSION: 2.0000 mg | INTRAVENOUS | Status: DC | PRN

## 2024-04-03 MED ORDER — ORAL CARE MOUTH RINSE
15.0000 mL | OROMUCOSAL | Status: DC
  Administered 2024-04-03: 15 mL via OROMUCOSAL

## 2024-04-03 MED ORDER — ACETAMINOPHEN 325 MG PO TABS: 650.0000 mg | ORAL_TABLET | Freq: Four times a day (QID) | ORAL | Status: DC | PRN

## 2024-04-03 MED ORDER — SODIUM CHLORIDE 0.9 % IV SOLN
2.0000 mg/h | INTRAVENOUS | Status: DC
  Administered 2024-04-03: 2 mg/h via INTRAVENOUS
  Filled 2024-04-03: qty 5

## 2024-04-03 MED ORDER — SODIUM CHLORIDE 0.9 % IV SOLN
1.5000 g | Freq: Four times a day (QID) | INTRAVENOUS | Status: DC
  Administered 2024-04-03: 1.5 g via INTRAVENOUS
  Filled 2024-04-03: qty 4
  Filled 2024-04-03: qty 1.5

## 2024-04-03 MED ORDER — POLYVINYL ALCOHOL 1.4 % OP SOLN: 1.0000 [drp] | Freq: Four times a day (QID) | OPHTHALMIC | Status: DC | PRN

## 2024-04-03 MED ORDER — GLYCOPYRROLATE 1 MG PO TABS: 1.0000 mg | ORAL_TABLET | ORAL | Status: DC | PRN

## 2024-04-03 MED ORDER — GLYCOPYRROLATE 0.2 MG/ML IJ SOLN: 0.2000 mg | INTRAMUSCULAR | Status: DC | PRN

## 2024-04-03 MED ORDER — ACETAMINOPHEN 650 MG RE SUPP: 650.0000 mg | Freq: Four times a day (QID) | RECTAL | Status: DC | PRN

## 2024-04-03 MED ORDER — LORAZEPAM 2 MG/ML IJ SOLN
1.0000 mg | INTRAMUSCULAR | Status: DC | PRN
  Administered 2024-04-03 (×2): 1 mg via INTRAVENOUS
  Filled 2024-04-03 (×2): qty 1

## 2024-04-03 MED ORDER — HYDROMORPHONE HCL 1 MG/ML IJ SOLN
2.0000 mg | INTRAMUSCULAR | Status: AC
  Administered 2024-04-03: 2 mg via INTRAVENOUS
  Filled 2024-04-03: qty 2

## 2024-04-03 MED ORDER — LORAZEPAM 2 MG/ML PO CONC: 1.0000 mg | ORAL | Status: DC | PRN

## 2024-04-03 MED ORDER — HYDROMORPHONE HCL 1 MG/ML IJ SOLN: 2.0000 mg | INTRAMUSCULAR | Status: DC | PRN

## 2024-04-03 MED ORDER — GLYCOPYRROLATE 0.2 MG/ML IJ SOLN
0.2000 mg | INTRAMUSCULAR | Status: DC | PRN
  Administered 2024-04-03 (×2): 0.2 mg via INTRAVENOUS
  Filled 2024-04-03 (×2): qty 1

## 2024-04-03 MED ORDER — HYDROMORPHONE HCL-NACL 50-0.9 MG/50ML-% IV SOLN
2.0000 mg/h | INTRAVENOUS | Status: DC
  Filled 2024-04-03: qty 50

## 2024-04-04 ENCOUNTER — Ambulatory Visit

## 2024-04-04 ENCOUNTER — Other Ambulatory Visit: Payer: Self-pay | Admitting: Oncology

## 2024-04-04 LAB — TYPE AND SCREEN
ABO/RH(D): B POS
Antibody Screen: NEGATIVE

## 2024-04-04 LAB — ABO/RH: ABO/RH(D): B POS

## 2024-04-04 NOTE — Progress Notes (Signed)
 PROGRESS NOTE    LATUNYA KISSICK  FMW:[id number] DOB: 21-Dec-1945 DOA: 03/18/2024 PCP: Epifanio Alm SQUIBB, MD   Assessment & Plan:   Principal Problem:   Acute saddle pulmonary embolism (HCC) Active Problems:   Acute respiratory failure with hypoxia (HCC)   Acute pulmonary embolism without acute cor pulmonale (HCC)   Severe sepsis (HCC)   Mild pulmonic regurgitation and RV dysfunction by prior echocardiogram   AKI (acute kidney injury)   Palliative care encounter  Assessment and Plan: Failure to thrive: secondary to all below. High risk for further decline. Poor prognosis. After a discuss w/ MPOA and pt's brother, pt was made comfort care only   GI hemorrhage: has maroon stools and Hb 7.1. Holding eliquis . 1 unit pRBCs was not given b/c pt was made comfort care only. 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 Na level is WNL today. S/p D5W bolus, 3% saline, tolvaptan , salt tabs as per nephro. Continue fluid restriction as per nephro.  Nephro signed off 04/01/24    Acute metabolic encephalopathy: likely secondary to above. Hallucinating. Re-orient prn    Acute segmental PE: w/ US  shows nonocclusive thrombus in right posterior tibial and peroneal vein. IR consulted for acute segmental PE clot burden minimal and thrombolysis contraindicated due to brain metastasis. D/c eliquis . Comfort care only   CAP: completed abx course. Spiking fevers, unclear etiology. Repeat blood cxs NGTD. CXR on 11/27 is unchanged from prior.  Bronchodilators prn. Resp viral panel is neg    Acute hypoxic respiratory failure: likely multifactorial- PE, pneumonia, a.fib, pulmon HTN. Still high risk for further decline. Comfort care only    Atrial fibrillation: w/ RVR. Comfort care only   Pulmon HTN: echo 03/19/24: EF 60 to 65%, grade I diastolic dysfunction, right ventricle size moderately enlarged, severely elevated  pulmonary artery systolic pressures, severe tricuspid valve regurgitation.   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 Poor po intake. Onco recs apprec. High risk for further decline   AKI: resolved  Microcytic anemia: iron is WNL.  No need for a transfusion currently   Thrombocytosis: likely reactive. Comfort care only    Generalized weakness: PT/OT recs SNF.         DVT prophylaxis: eliquis  Code Status: DNR Family Communication: discussed pt's care w/ pt's friend, Candis, at bedside and answered her questions Disposition Plan: PT/OT recs SNF. High risk for further decline. Poor prognosis    Level of care: Stepdown Consultants:  [facility] ICU Nephro   Procedures:  Antimicrobials:  Subjective: Pt is obtunded   Objective: Vitals:    0600  0700  0714  0750  BP: 124/63     Pulse: 92 93    Resp: 18 13    Temp:   98.8 F (37.1 C)   TempSrc:   Axillary   SpO2:    97%  Weight:      Height:        Intake/Output Summary (Last 24 hours) at  0912 Last data filed at  9277 Gross per 24 hour  Intake --  Output 100 ml  Net -100 ml   Filed Weights   03/30/24 0500 03/31/24 0500 04/02/24 0500  Weight: 82.3 kg 83.8 kg 81.5 kg    Examination:  General exam: appears obtunded  Respiratory system: decreased breath sounds b/l  Cardiovascular system: S1/S2+ Gastrointestinal system: abd is soft, obese &  hypoactive bowel sounds Central nervous system: obtunded Psychiatry: judgement and insight appears poor    Data Reviewed: I have personally reviewed following labs and imaging studies  CBC: Recent Labs  Lab 03/30/24 0439 03/31/24 0553 04/01/24 0438 04/02/24 0406 2024/04/23 0511  WBC 16.9* 19.2* 31.5* 25.6* 15.3*  HGB 9.5* 8.2* 8.1* 7.1* 7.1*  HCT 30.9* 26.7* 26.2* 23.8* 24.0*  MCV 78.6* 80.7 80.9 83.2 83.3  PLT 463* 467* 467* 433* 465*   Basic Metabolic Panel: Recent  Labs  Lab 03/29/24 0321 03/29/24 1407 03/30/24 0438 03/30/24 0642 03/31/24 0554 03/31/24 1012 04/01/24 0438 04/01/24 0656 04/01/24 0911 04/01/24 1321 04/01/24 1746 Apr 23, 2024 0511  NA 117*   < > 125*   < > 130*   < > 132* 133* 132* 132* 132* 136  K 4.3  --  4.8  --  4.2  --  4.5  --   --   --   --  4.6  CL 81*  --  90*  --  96*  --  94*  --   --   --   --  98  CO2 29  --  25  --  27  --  29  --   --   --   --  29  GLUCOSE 123*  --  114*  --  114*  --  126*  --   --   --   --  119*  BUN 11  --  12  --  12  --  14  --   --   --   --  21  CREATININE 0.38*  --  0.45  --  0.46  --  0.62  --   --   --   --  0.66  CALCIUM 8.6*  --  8.8*  --  8.4*  --  8.9  --   --   --   --  9.0  MG  --   --  2.0  --   --   --   --   --   --   --   --   --   PHOS  --   --  3.7  --  3.7  --  4.2  --   --   --   --   --    < > = values in this interval not displayed.   GFR: Estimated Creatinine Clearance: 53.9 mL/min (by C-G formula based on SCr of 0.66 mg/dL). Liver Function Tests: Recent Labs  Lab 03/29/24 0159 03/30/24 0438 03/31/24 0554 04/01/24 0438  AST 25  --   --   --   ALT 18  --   --   --   ALKPHOS 185*  --   --   --   BILITOT 0.3  --   --   --   PROT 6.2*  --   --   --   ALBUMIN  2.8* 2.7* 2.6* 2.8*   No results for input(s): LIPASE, AMYLASE in the last 168 hours. No results for input(s): AMMONIA in the last 168 hours. Coagulation Profile: No results for input(s): INR, PROTIME in the last 168 hours. Cardiac Enzymes: No results for input(s): CKTOTAL, CKMB, CKMBINDEX, TROPONINI in the last 168 hours. BNP (last 3 results) Recent Labs    03/18/24 1850 03/20/24 1525 03/21/24 1941  PROBNP 26,777.0* 12,988.0* 14,216.0*   HbA1C: No results for input(s): HGBA1C in the last 72 hours. CBG: Recent Labs  Lab 03/29/24 1038 03/30/24  1631  GLUCAP 154* 130*   Lipid Profile: No results for input(s): CHOL, HDL, LDLCALC, TRIG, CHOLHDL, LDLDIRECT in the last  72 hours. Thyroid  Function Tests: No results for input(s): TSH, T4TOTAL, FREET4, T3FREE, THYROIDAB in the last 72 hours. Anemia Panel: No results for input(s): VITAMINB12, FOLATE, FERRITIN, TIBC, IRON, RETICCTPCT in the last 72 hours. Sepsis Labs: Recent Labs  Lab 04/01/24 0952  PROCALCITON 1.66    Recent Results (from the past 240 hours)  Culture, blood (Routine X 2) w Reflex to ID Panel     Status: None (Preliminary result)   Collection Time: 04/02/24 10:45 AM   Specimen: BLOOD LEFT HAND  Result Value Ref Range Status   Specimen Description BLOOD LEFT HAND  Final   Special Requests   Final    BOTTLES DRAWN AEROBIC AND ANAEROBIC Blood Culture adequate volume   Culture   Final    NO GROWTH < 24 HOURS Performed at [facility], [address] [place], [place] [address]    Report Status PENDING  Incomplete  Culture, blood (Routine X 2) w Reflex to ID Panel     Status: None (Preliminary result)   Collection Time: 04/02/24 10:52 AM   Specimen: BLOOD RIGHT ARM  Result Value Ref Range Status   Specimen Description BLOOD RIGHT ARM PORTA CATH  Final   Special Requests AEROBIC BOTTLE ONLY Blood Culture adequate volume  Final   Culture   Final    NO GROWTH < 24 HOURS Performed at [facility], [address], [place], [place] [address]    Report Status PENDING  Incomplete          Radiology Studies: DG Abd Portable 1V Result Date: 04/02/2024 CLINICAL DATA:  Abdominal pain EXAM: PORTABLE ABDOMEN - 1 VIEW COMPARISON:  Chest x-ray 03/31/2024 FINDINGS: The bowel gas pattern is normal. Stool burden is average. There are vascular calcifications in the pelvis. No definitive renal calculi are seen. The bones are osteopenic. Bilateral hip arthroplasties are present. Left basilar consolidation again noted. IMPRESSION: 1. Nonobstructive bowel gas pattern. 2. Left basilar consolidation again noted. Electronically Signed   By: Greig Pique M.D.   On:  04/02/2024 18:54        Scheduled Meds:  sodium chloride    Intravenous Once   albuterol   2.5 mg Nebulization TID   ALPRAZolam   0.5 mg Oral QHS   amiodarone   200 mg Oral Daily   apixaban   5 mg Oral BID   vitamin C   500 mg Oral BID   Chlorhexidine  Gluconate Cloth  6 each Topical Daily   dextromethorphan -guaiFENesin   1 tablet Oral BID   DULoxetine   30 mg Oral Daily   magic mouthwash w/lidocaine   5 mL Oral QID   metoprolol  tartrate  25 mg Oral BID   multivitamin with minerals  1 tablet Oral Daily   Ensure Max Protein  11 oz Oral BID   Continuous Infusions:  ampicillin -sulbactam (UNASYN ) IV        LOS: 16 days        Anthony CHRISTELLA Pouch, MD [facility] Pager [phone]  If 7PM-7AM, please contact night-coverage [email or web address] Password [email or web address] 11-Apr-2024, 9:12 AM

## 2024-04-04 NOTE — Plan of Care (Signed)
  Problem: Education: Goal: Knowledge of General Education information will improve Description: Including pain rating scale, medication(s)/side effects and non-pharmacologic comfort measures Outcome: Not Progressing   Problem: Health Behavior/Discharge Planning: Goal: Ability to manage health-related needs will improve Outcome: Not Progressing   Problem: Clinical Measurements: Goal: Ability to maintain clinical measurements within normal limits will improve Outcome: Not Progressing Goal: Will remain free from infection Outcome: Not Progressing Goal: Diagnostic test results will improve Outcome: Not Progressing Goal: Respiratory complications will improve Outcome: Not Progressing Goal: Cardiovascular complication will be avoided Outcome: Not Progressing   Problem: Nutrition: Goal: Adequate nutrition will be maintained Outcome: Not Progressing   Problem: Coping: Goal: Level of anxiety will decrease Outcome: Not Progressing   Problem: Elimination: Goal: Will not experience complications related to bowel motility Outcome: Not Progressing Goal: Will not experience complications related to urinary retention Outcome: Not Progressing   Problem: Pain Managment: Goal: General experience of comfort will improve and/or be controlled Outcome: Not Progressing   Problem: Safety: Goal: Ability to remain free from injury will improve Outcome: Not Progressing   Problem: Skin Integrity: Goal: Risk for impaired skin integrity will decrease Outcome: Not Progressing   Problem: Education: Goal: Knowledge of disease or condition will improve Outcome: Not Progressing Goal: Understanding of medication regimen will improve Outcome: Not Progressing Goal: Individualized Educational Video(s) Outcome: Not Progressing   Problem: Activity: Goal: Ability to tolerate increased activity will improve Outcome: Not Progressing   Problem: Cardiac: Goal: Ability to achieve and maintain adequate  cardiopulmonary perfusion will improve Outcome: Not Progressing   Problem: Health Behavior/Discharge Planning: Goal: Ability to safely manage health-related needs after discharge will improve Outcome: Not Progressing   Problem: Education: Goal: Knowledge of the prescribed therapeutic regimen will improve Outcome: Not Progressing   Problem: Activity: Goal: Ability to implement measures to reduce episodes of fatigue will improve Outcome: Not Progressing   Problem: Bowel/Gastric: Goal: Will not experience complications related to bowel motility Outcome: Not Progressing   Problem: Coping: Goal: Ability to identify and develop effective coping behavior will improve Outcome: Not Progressing   Problem: Nutritional: Goal: Maintenance of adequate nutrition will improve Outcome: Not Progressing   Problem: Education: Goal: Knowledge of the prescribed therapeutic regimen will improve Outcome: Not Progressing   Problem: Coping: Goal: Ability to identify and develop effective coping behavior will improve Outcome: Not Progressing   Problem: Clinical Measurements: Goal: Quality of life will improve Outcome: Not Progressing   Problem: Respiratory: Goal: Verbalizations of increased ease of respirations will increase Outcome: Not Progressing   Problem: Role Relationship: Goal: Family's ability to cope with current situation will improve Outcome: Not Progressing Goal: Ability to verbalize concerns, feelings, and thoughts to partner or family member will improve Outcome: Not Progressing

## 2024-04-04 NOTE — Progress Notes (Signed)
    0902  Spiritual Encounters  Type of Visit Initial  Care provided to: [facility] partners present during encounter Nurse  Referral source Chaplain team  Reason for visit End-of-life  OnCall Visit Yes  Interventions  Spiritual Care Interventions Made Established relationship of care and support   Followed up per previous chaplain request.  Introduced myself and provided support to brother.  He let me know that last rites have already been performed, so a priest is no longer needed.  Informed him and nursing staff that continued chaplain support is available.

## 2024-04-04 NOTE — Progress Notes (Signed)
    1748  Spiritual Encounters  Type of Visit Follow up  Care provided to: Family;Friend  Conversation partners present during encounter Nurse  Referral source Clinical staff  Reason for visit Patient death  OnCall Visit Yes  Interventions  Spiritual Care Interventions Made Established relationship of care and support;Compassionate presence;Reflective listening;Bereavement/grief support;Decision-making support/facilitation   Chaplain called to be present with family at time of patient death.  Provided compassionate support, reflective listening with life stories, bereavement support, compassionate presence.

## 2024-04-04 NOTE — Progress Notes (Incomplete)
 [facility] Liaison Note  Follow up on new referral for hospice services.  Patient remains in the hospital in ICU.  Patient transitioned to full comfort care today. [facility] liaison team will continue to follow through final disposition.  Please all with any hospice related questions or concerns.  Saddie HILARIO Na, RN Nurse Liaision [phone]

## 2024-04-04 NOTE — Plan of Care (Signed)
 Problem: Education: Goal: Knowledge of General Education information will improve Description: Including pain rating scale, medication(s)/side effects and non-pharmacologic comfort measures  1731 by Dodson Millin, Sula, RN Outcome: Not Applicable  1730 by Dodson Millin, Sula, RN Outcome: Not Progressing   Problem: Health Behavior/Discharge Planning: Goal: Ability to manage health-related needs will improve  1731 by Dodson Millin Sula, RN Outcome: Not Applicable  1730 by Dodson Millin, Sula, RN Outcome: Not Progressing   Problem: Clinical Measurements: Goal: Ability to maintain clinical measurements within normal limits will improve  1731 by Dodson Millin Sula, RN Outcome: Not Applicable  1730 by Dodson Millin, Sula, RN Outcome: Not Progressing Goal: Will remain free from infection  1731 by Dodson Millin Sula, RN Outcome: Not Applicable  1730 by Dodson Millin, Sula, RN Outcome: Not Progressing Goal: Diagnostic test results will improve  1731 by Dodson Millin Sula, RN Outcome: Not Applicable  1730 by Dodson Millin, Sula, RN Outcome: Not Progressing Goal: Respiratory complications will improve  1731 by Dodson Millin Sula, RN Outcome: Not Applicable  1730 by Dodson Millin, Sula, RN Outcome: Not Progressing Goal: Cardiovascular complication will be avoided  1731 by Dodson Millin Sula, RN Outcome: Not Applicable  1730 by Dodson Millin Sula, RN Outcome: Not Progressing   Problem: Activity: Goal: Risk for activity intolerance will decrease  1731 by Dodson Millin, Sula, RN Outcome: Not Applicable  1730 by Dodson Millin, Sula, RN Outcome: Not Progressing   Problem: Nutrition: Goal: Adequate nutrition will be maintained  1731 by Dodson Millin, Sula, RN Outcome: Not Applicable  1730 by Dodson Millin, Sula, RN Outcome: Not Progressing   Problem: Coping: Goal: Level of anxiety will decrease  1731 by Dodson Millin Sula, RN Outcome: Not Applicable  1730 by Dodson Millin, Sula, RN Outcome: Not Progressing   Problem: Elimination: Goal: Will not experience complications related to bowel motility  1731 by Dodson Millin Sula, RN Outcome: Not Applicable  1730 by Dodson Millin, Sula, RN Outcome: Not Progressing Goal: Will not experience complications related to urinary retention  1731 by Dodson Millin Sula, RN Outcome: Not Applicable  1730 by Dodson Millin, Sula, RN Outcome: Not Progressing   Problem: Pain Managment: Goal: General experience of comfort will improve and/or be controlled  1731 by Dodson Millin, Sula, RN Outcome: Not Applicable  1730 by Dodson Millin, Sula, RN Outcome: Not Progressing   Problem: Safety: Goal: Ability to remain free from injury will improve  1731 by Dodson Millin Sula, RN Outcome: Not Applicable  1730 by Dodson Millin, Sula, RN Outcome: Not Progressing   Problem: Skin Integrity: Goal: Risk for impaired skin integrity will decrease  1731 by Dodson Millin Sula, RN Outcome: Not Applicable  1730 by Dodson Millin, Sula, RN Outcome: Not Progressing   Problem: Education: Goal: Knowledge of disease or condition will improve  1731 by Dodson Millin Sula, RN Outcome: Not Applicable  1730 by Dodson Millin, Sula, RN Outcome: Not Progressing Goal: Understanding of medication regimen will improve  1731 by Dodson Millin Sula, RN Outcome: Not Applicable  1730 by Dodson Millin Sula, RN Outcome: Not Progressing Goal: Individualized Educational  Video(s)  1731 by Dodson Millin Sula, RN Outcome: Not Applicable  1730 by Dodson Millin Sula, RN Outcome: Not Progressing   Problem: Activity: Goal: Ability to tolerate increased activity will improve  1731 by Dodson Millin, Sula, RN Outcome: Not Applicable  1730 by Dodson Millin, Sula, RN Outcome: Not Progressing   Problem: Cardiac: Goal: Ability to achieve and maintain adequate cardiopulmonary perfusion will improve   1731 by Dodson Laurian Fox, RN Outcome: Not Applicable  1730 by Dodson Laurian, Fox, RN Outcome: Not Progressing   Problem: Health Behavior/Discharge Planning: Goal: Ability to safely manage health-related needs after discharge will improve  1731 by Dodson Laurian Fox, RN Outcome: Not Applicable  1730 by Dodson Laurian, Fox, RN Outcome: Not Progressing   Problem: Education: Goal: Knowledge of the prescribed therapeutic regimen will improve  1731 by Dodson Laurian Fox, RN Outcome: Not Applicable  1730 by Dodson Laurian Fox, RN Outcome: Not Progressing   Problem: Activity: Goal: Ability to implement measures to reduce episodes of fatigue will improve  1731 by Dodson Laurian Fox, RN Outcome: Not Applicable  1730 by Dodson Laurian, Fox, RN Outcome: Not Progressing   Problem: Bowel/Gastric: Goal: Will not experience complications related to bowel motility  1731 by Dodson Laurian, Fox, RN Outcome: Not Applicable  1730 by Dodson Laurian, Fox, RN Outcome: Not Progressing   Problem: Coping: Goal: Ability to identify and develop effective coping behavior will improve  1731 by Dodson Laurian Fox, RN Outcome: Not Applicable  1730 by Dodson Laurian, Fox, RN Outcome: Not Progressing   Problem: Nutritional: Goal: Maintenance of  adequate nutrition will improve  1731 by Dodson Laurian Fox, RN Outcome: Not Applicable  1730 by Dodson Laurian, Fox, RN Outcome: Not Progressing   Problem: Acute Rehab PT Goals(only PT should resolve) Goal: Pt Will Go Supine/Side To Sit Outcome: Not Applicable Goal: Patient Will Transfer Sit To/From Stand Outcome: Not Applicable Goal: Pt Will Transfer Bed To Chair/Chair To Bed Outcome: Not Applicable Goal: Pt Will Ambulate Outcome: Not Applicable Goal: Pt Will Go Up/Down Stairs Outcome: Not Applicable   Problem: Acute Rehab OT Goals (only OT should resolve) Goal: Pt. Will Perform Grooming Outcome: Not Applicable Goal: Pt. Will Perform Lower Body Dressing Outcome: Not Applicable Goal: Pt. Will Transfer To Toilet Outcome: Not Applicable Goal: Pt. Will Perform Toileting-Clothing Manipulation Outcome: Not Applicable   Problem: Inadequate Intake (NI-2.1) Goal: Food and/or nutrient delivery Description: Individualized approach for food/nutrient provision. Outcome: Not Applicable   Problem: Education: Goal: Knowledge of the prescribed therapeutic regimen will improve  1731 by Dodson Laurian, Fox, RN Outcome: Not Applicable  1730 by Dodson Laurian, Fox, RN Outcome: Not Progressing   Problem: Coping: Goal: Ability to identify and develop effective coping behavior will improve  1731 by Dodson Laurian Fox, RN Outcome: Not Applicable  1730 by Dodson Laurian, Fox, RN Outcome: Not Progressing   Problem: Clinical Measurements: Goal: Quality of life will improve  1731 by Dodson Laurian Fox, RN Outcome: Not Applicable  1730 by Dodson Laurian, Fox, RN Outcome: Not Progressing   Problem: Respiratory: Goal: Verbalizations of increased ease of respirations will increase  1731 by Dodson Laurian Fox, RN Outcome: Not Applicable  1730 by Dodson Laurian, Fox, RN Outcome: Not Progressing   Problem: Role Relationship: Goal: Family's ability to cope with current situation will improve  1731 by Dodson Laurian Fox, RN Outcome: Not Applicable  1730 by Dodson Laurian, Fox, RN Outcome: Not Progressing Goal: Ability to verbalize concerns, feelings, and thoughts to partner or family member will improve  1731 by Dodson Laurian Fox, RN Outcome: Not Applicable  1730 by Dodson Laurian, Fox, RN Outcome: Not Progressing   Problem: Pain Management: Goal: Satisfaction with pain management regimen will improve  1731 by Dodson Laurian Fox, RN Outcome: Not Applicable  1730 by Dodson Laurian Fox, RN Outcome: Not Progressing

## 2024-04-04 NOTE — Progress Notes (Addendum)
 The patient had expired at 1645 pm no auscultated apical pulse noted for one minute with second Obie Sor I, RN. The patient's brother was at the bedside during time of death and has notified [facility]) via phone. [facility] has been notified.

## 2024-04-04 NOTE — Progress Notes (Signed)
 Pt made comfort care, changed to [address]

## 2024-04-04 DEATH — deceased

## 2024-04-05 ENCOUNTER — Ambulatory Visit

## 2024-04-06 ENCOUNTER — Ambulatory Visit

## 2024-04-06 ENCOUNTER — Other Ambulatory Visit

## 2024-04-06 ENCOUNTER — Inpatient Hospital Stay: Admitting: Oncology

## 2024-04-06 ENCOUNTER — Ambulatory Visit: Admitting: Oncology

## 2024-04-06 ENCOUNTER — Inpatient Hospital Stay: Admitting: Hospice and Palliative Medicine

## 2024-04-06 NOTE — Radiation Completion Notes (Signed)
 Patient Name: Hannah Yu, [place] MRN: [id number] Date of Birth: 1946-04-17 Referring Physician: ALM NEEDLE, M.D. Date of Service: 2024-04-06 Radiation Oncologist: Marcey Penton, M.D. 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  She has received brain SRS as well as SBRT to her left shoulder back in June 2025.  She is still having pain in the left shoulder although this may residual from the bony destruction caused by metastatic involvement at the time.  She has been having some atypical chest pain and shoulder pain and she did have a lesion on her last PET scan around the T6 vertebral body with close approximation to the spinal canal.  She was seen today in infusion for evaluation..      ==========DELIVERED PLANS==========  First Treatment Date: 2024-03-11 Last Treatment Date: 2024-03-17   Plan Name: Spine_T Site: Thoracic Spine Technique: SBRT/SRT-IMRT Mode: Photon Dose Per Fraction: 6 Gy Prescribed Dose (Delivered / Prescribed): 12 Gy / 30 Gy Prescribed Fxs (Delivered / Prescribed): 2 / 5     ==========ON TREATMENT VISIT DATES========== 2024-03-14, 2024-03-17     ==========UPCOMING VISITS==========       ==========APPENDIX - ON TREATMENT VISIT NOTES==========   See weekly On Treatment Notes in Epic for details in the Media tab (listed as Progress notes on the On Treatment Visit Dates listed above).

## 2024-04-07 ENCOUNTER — Ambulatory Visit

## 2024-04-08 ENCOUNTER — Ambulatory Visit

## 2024-04-08 LAB — CULTURE, BLOOD (ROUTINE X 2)
Culture: NO GROWTH
Culture: NO GROWTH
Special Requests: ADEQUATE
Special Requests: ADEQUATE

## 2024-04-11 ENCOUNTER — Ambulatory Visit

## 2024-04-12 ENCOUNTER — Ambulatory Visit

## 2024-04-13 ENCOUNTER — Inpatient Hospital Stay: Admitting: Oncology

## 2024-04-13 ENCOUNTER — Other Ambulatory Visit (HOSPITAL_COMMUNITY): Payer: Self-pay

## 2024-04-13 ENCOUNTER — Inpatient Hospital Stay

## 2024-04-14 ENCOUNTER — Ambulatory Visit

## 2024-04-15 ENCOUNTER — Ambulatory Visit

## 2024-04-15 ENCOUNTER — Inpatient Hospital Stay

## 2024-04-26 ENCOUNTER — Inpatient Hospital Stay

## 2024-04-26 ENCOUNTER — Inpatient Hospital Stay: Admitting: Nurse Practitioner

## 2024-05-03 ENCOUNTER — Inpatient Hospital Stay

## 2024-05-03 ENCOUNTER — Inpatient Hospital Stay: Admitting: Oncology

## 2024-05-04 ENCOUNTER — Inpatient Hospital Stay

## 2024-05-05 NOTE — Death Summary Note (Addendum)
 Death Summary  Hannah Yu FMW:[id number] DOB: 08-06-1945 DOA: 04/10/24  PCP: Epifanio Alm SQUIBB, MD PCP/Office notified:   Admit date: 04-10-24 Date of Death: 04/27/2024  Final Diagnoses:  Principal Problem:   Acute saddle pulmonary embolism (HCC) Active Problems:   Acute respiratory failure with hypoxia (HCC)   Acute pulmonary embolism without acute cor pulmonale (HCC)   Severe sepsis (HCC)   Mild pulmonic regurgitation and RV dysfunction by prior echocardiogram   AKI (acute kidney injury)   Palliative care encounter   History of present illness:  HPI was taken from NP Ouma: 79 y.o female with significant PMH of DVT, SVT, osteoarthritis, HTN, HLD, GERD, diverticulitis, chronic acquired lymphedema, B12 deficiency, former smoker for 27 years, 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  He reports she is been nonambulatory due to generalized weakness and chemo related peripheral neuropathy.  Patient usually sits for long periods of time in the recliner and had a fall last week.  On EMS arrival patient was hypoxic with sats of 62% on room air.  She was placed on CPAP and transported to the ED.   ED Course: Initial vital signs showed HR of 130 beats/minute, BP 128/126mm Hg, the RR 21 breaths/minute,95 %O2 Sat on CPAP and Temp of 99.36F (36.4C).  Pertinent Labs/Diagnostics Findings: Na+/ K+: 117/5.0 Glucose:195 BUN/Cr.:35/1.05 AST/ALT54/30: CO2:21 AG:17 WBC: 27.3K/L Hgb/Hct:9.9/30.7 Plts:465  PCT: 11.5  Lactic acid: 4.6~2.7 COVID PCR: Negative,  HsTrop:349~365 [id number]   CXR>? Pneumonia, left pleural effusion CTA Chest> Positive for right segmental PE   Patient was started on Heparin  gtt and abx  initated for CAP coverage. PCCM consulted for admission    Hospital Course:  Failure to thrive: secondary to all below. High risk for further decline. Poor prognosis. After a discuss w/ MPOA and pt's brother, pt was made comfort care only on 04-26-2024   GI hemorrhage: has maroon stools and Hb 7.1. Holding eliquis . 1 unit pRBCs was not given b/c pt was made comfort care only. 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 Na level is WNL today. S/p D5W bolus, 3% saline, tolvaptan , salt tabs as per nephro. Continue fluid restriction as per nephro.  Nephro signed off 04/01/24    Acute metabolic encephalopathy: likely secondary to above. Hallucinating. Re-orient prn    Acute segmental PE: w/ US  shows nonocclusive thrombus in right posterior tibial and peroneal vein. IR consulted for acute segmental PE clot burden minimal and thrombolysis contraindicated due to brain metastasis. D/c eliquis . Comfort care only    CAP: completed abx course. Spiking fevers, unclear etiology. Repeat blood cxs NGTD. CXR on 11/27 is unchanged from prior.  Bronchodilators prn. Resp viral panel is neg    Acute hypoxic respiratory failure: likely multifactorial- PE, pneumonia, a.fib, pulmon HTN. Still high risk for further decline. Comfort care only    Atrial fibrillation: w/ RVR. Comfort care only    Pulmon HTN: echo 03/19/24: EF 60 to 65%, grade I diastolic dysfunction, right ventricle size moderately enlarged, severely elevated pulmonary artery systolic pressures, severe tricuspid valve regurgitation.    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 Poor po intake. Onco recs apprec. High risk for further decline    AKI: resolved   Microcytic anemia: iron is WNL.  No need for a transfusion currently    Thrombocytosis: likely reactive. Comfort care only    Generalized weakness: PT/OT recs SNF.        Time of death: 4:45 pm Time: 40 mins   Signed:  Anthony CHRISTELLA Pouch  [facility] 04/04/2024, 10:47 AM

## 2024-05-18 ENCOUNTER — Inpatient Hospital Stay: Admitting: Oncology

## 2024-05-18 ENCOUNTER — Inpatient Hospital Stay

## 2024-05-18 ENCOUNTER — Inpatient Hospital Stay: Admitting: Hospice and Palliative Medicine

## 2024-05-20 ENCOUNTER — Inpatient Hospital Stay

## 2024-05-25 ENCOUNTER — Inpatient Hospital Stay

## 2024-05-25 ENCOUNTER — Inpatient Hospital Stay: Admitting: Oncology

## 2024-05-27 ENCOUNTER — Inpatient Hospital Stay
